# Patient Record
Sex: Female | Born: 1935
Health system: Southern US, Community
[De-identification: ages and names within clinical notes are randomized; demographics above are authoritative.]

## PROBLEM LIST (undated history)

## (undated) DIAGNOSIS — E7800 Pure hypercholesterolemia, unspecified: Secondary | ICD-10-CM

## (undated) DIAGNOSIS — Z974 Presence of external hearing-aid: Secondary | ICD-10-CM

## (undated) DIAGNOSIS — I10 Essential (primary) hypertension: Secondary | ICD-10-CM

## (undated) DIAGNOSIS — M8580 Other specified disorders of bone density and structure, unspecified site: Secondary | ICD-10-CM

## (undated) DIAGNOSIS — K8689 Other specified diseases of pancreas: Secondary | ICD-10-CM

## (undated) DIAGNOSIS — M51369 Other intervertebral disc degeneration, lumbar region without mention of lumbar back pain or lower extremity pain: Secondary | ICD-10-CM

## (undated) DIAGNOSIS — K589 Irritable bowel syndrome without diarrhea: Secondary | ICD-10-CM

## (undated) DIAGNOSIS — Z8719 Personal history of other diseases of the digestive system: Secondary | ICD-10-CM

## (undated) DIAGNOSIS — IMO0002 Reserved for concepts with insufficient information to code with codable children: Secondary | ICD-10-CM

## (undated) DIAGNOSIS — K5790 Diverticulosis of intestine, part unspecified, without perforation or abscess without bleeding: Secondary | ICD-10-CM

## (undated) DIAGNOSIS — M1990 Unspecified osteoarthritis, unspecified site: Secondary | ICD-10-CM

## (undated) DIAGNOSIS — I639 Cerebral infarction, unspecified: Secondary | ICD-10-CM

## (undated) DIAGNOSIS — N83209 Unspecified ovarian cyst, unspecified side: Secondary | ICD-10-CM

## (undated) DIAGNOSIS — M5136 Other intervertebral disc degeneration, lumbar region: Secondary | ICD-10-CM

## (undated) DIAGNOSIS — C801 Malignant (primary) neoplasm, unspecified: Secondary | ICD-10-CM

## (undated) DIAGNOSIS — N1330 Unspecified hydronephrosis: Secondary | ICD-10-CM

## (undated) HISTORY — PX: COLONOSCOPY: SHX174

## (undated) HISTORY — PX: FRACTURE SURGERY: SHX138

## (undated) HISTORY — PX: TONSILLECTOMY: SUR1361

## (undated) HISTORY — PX: OTHER SURGICAL HISTORY: SHX169

---

## 2004-06-01 ENCOUNTER — Ambulatory Visit: Payer: Self-pay | Admitting: Internal Medicine

## 2004-10-05 ENCOUNTER — Encounter: Payer: Self-pay | Admitting: Internal Medicine

## 2004-12-13 ENCOUNTER — Ambulatory Visit: Payer: Self-pay | Admitting: Internal Medicine

## 2005-12-14 ENCOUNTER — Ambulatory Visit: Payer: Self-pay | Admitting: Internal Medicine

## 2005-12-21 ENCOUNTER — Inpatient Hospital Stay: Payer: Self-pay | Admitting: Internal Medicine

## 2005-12-21 ENCOUNTER — Other Ambulatory Visit: Payer: Self-pay

## 2007-01-21 ENCOUNTER — Ambulatory Visit: Payer: Self-pay | Admitting: Internal Medicine

## 2008-01-26 ENCOUNTER — Ambulatory Visit: Payer: Self-pay | Admitting: Internal Medicine

## 2008-03-05 ENCOUNTER — Emergency Department: Payer: Self-pay | Admitting: Emergency Medicine

## 2009-02-07 ENCOUNTER — Ambulatory Visit: Payer: Self-pay | Admitting: Internal Medicine

## 2009-10-19 ENCOUNTER — Ambulatory Visit: Payer: Self-pay | Admitting: Internal Medicine

## 2009-10-31 ENCOUNTER — Encounter: Payer: Self-pay | Admitting: Radiation Oncology

## 2009-11-11 ENCOUNTER — Encounter: Payer: Self-pay | Admitting: Radiation Oncology

## 2009-12-11 ENCOUNTER — Encounter: Payer: Self-pay | Admitting: Radiation Oncology

## 2010-01-11 ENCOUNTER — Encounter: Payer: Self-pay | Admitting: Radiation Oncology

## 2010-02-10 ENCOUNTER — Encounter: Payer: Self-pay | Admitting: Radiation Oncology

## 2010-03-10 ENCOUNTER — Ambulatory Visit: Payer: Self-pay | Admitting: Internal Medicine

## 2010-03-13 ENCOUNTER — Encounter: Payer: Self-pay | Admitting: Radiation Oncology

## 2011-03-12 ENCOUNTER — Ambulatory Visit: Payer: Self-pay | Admitting: Internal Medicine

## 2011-04-29 ENCOUNTER — Observation Stay: Payer: Self-pay | Admitting: Family Medicine

## 2011-07-02 ENCOUNTER — Ambulatory Visit: Payer: Self-pay

## 2011-08-05 ENCOUNTER — Ambulatory Visit: Payer: Self-pay | Admitting: Physician Assistant

## 2012-03-12 ENCOUNTER — Ambulatory Visit: Payer: Self-pay | Admitting: Family Medicine

## 2013-03-20 ENCOUNTER — Ambulatory Visit: Payer: Self-pay | Admitting: Family Medicine

## 2013-07-02 ENCOUNTER — Emergency Department: Payer: Self-pay | Admitting: Emergency Medicine

## 2013-07-02 LAB — CBC WITH DIFFERENTIAL/PLATELET
Basophil #: 0.1 10*3/uL (ref 0.0–0.1)
HCT: 40.8 % (ref 35.0–47.0)
HGB: 13.8 g/dL (ref 12.0–16.0)
Lymphocyte #: 1.9 10*3/uL (ref 1.0–3.6)
Lymphocyte %: 20.7 %
MCH: 29.7 pg (ref 26.0–34.0)
Monocyte %: 6.2 %
Neutrophil #: 6.5 10*3/uL (ref 1.4–6.5)
RDW: 13 % (ref 11.5–14.5)
WBC: 9.3 10*3/uL (ref 3.6–11.0)

## 2013-07-02 LAB — COMPREHENSIVE METABOLIC PANEL
Albumin: 4.2 g/dL (ref 3.4–5.0)
Alkaline Phosphatase: 92 U/L (ref 50–136)
Anion Gap: 6 — ABNORMAL LOW (ref 7–16)
BUN: 7 mg/dL (ref 7–18)
Bilirubin,Total: 0.6 mg/dL (ref 0.2–1.0)
Calcium, Total: 10.7 mg/dL — ABNORMAL HIGH (ref 8.5–10.1)
Chloride: 104 mmol/L (ref 98–107)
Co2: 28 mmol/L (ref 21–32)
Creatinine: 0.61 mg/dL (ref 0.60–1.30)
EGFR (African American): >60
EGFR (Non-African Amer.): >60
Glucose: 128 mg/dL — ABNORMAL HIGH (ref 65–99)
Osmolality: 275 (ref 275–301)
Potassium: 3.4 mmol/L — ABNORMAL LOW (ref 3.5–5.1)
Total Protein: 7.6 g/dL (ref 6.4–8.2)

## 2013-08-13 HISTORY — PX: OOPHORECTOMY: SHX86

## 2013-10-14 ENCOUNTER — Ambulatory Visit: Payer: Self-pay | Admitting: Family Medicine

## 2014-03-30 ENCOUNTER — Ambulatory Visit: Payer: Self-pay | Admitting: Family Medicine

## 2014-07-08 LAB — CBC
HCT: 39.3 % (ref 35.0–47.0)
HGB: 13.1 g/dL (ref 12.0–16.0)
MCH: 30.2 pg (ref 26.0–34.0)
MCHC: 33.3 g/dL (ref 32.0–36.0)
MCV: 91 fL (ref 80–100)
Platelet: 317 10*3/uL (ref 150–440)
RBC: 4.34 10*6/uL (ref 3.80–5.20)
RDW: 13.7 % (ref 11.5–14.5)
WBC: 10.7 10*3/uL (ref 3.6–11.0)

## 2014-07-08 LAB — COMPREHENSIVE METABOLIC PANEL
AST: 20 U/L (ref 15–37)
Albumin: 4.2 g/dL (ref 3.4–5.0)
Alkaline Phosphatase: 90 U/L
Anion Gap: 6 — ABNORMAL LOW (ref 7–16)
BILIRUBIN TOTAL: 0.3 mg/dL (ref 0.2–1.0)
BUN: 12 mg/dL (ref 7–18)
Calcium, Total: 10.7 mg/dL — ABNORMAL HIGH (ref 8.5–10.1)
Chloride: 103 mmol/L (ref 98–107)
Co2: 29 mmol/L (ref 21–32)
Creatinine: 1.1 mg/dL (ref 0.60–1.30)
GFR CALC NON AF AMER: 51 — AB
GLUCOSE: 122 mg/dL — AB (ref 65–99)
OSMOLALITY: 277 (ref 275–301)
POTASSIUM: 3.8 mmol/L (ref 3.5–5.1)
SGPT (ALT): 24 U/L
Sodium: 138 mmol/L (ref 136–145)
TOTAL PROTEIN: 7.7 g/dL (ref 6.4–8.2)

## 2014-07-08 LAB — URINALYSIS, COMPLETE
BILIRUBIN, UR: NEGATIVE
Bacteria: NONE SEEN
Blood: NEGATIVE
GLUCOSE, UR: NEGATIVE mg/dL (ref 0–75)
Ketone: NEGATIVE
Leukocyte Esterase: NEGATIVE
Nitrite: NEGATIVE
PROTEIN: NEGATIVE
Ph: 6 (ref 4.5–8.0)
RBC,UR: <1 /HPF (ref 0–5)
Specific Gravity: 1.003 (ref 1.003–1.030)
Squamous Epithelial: NONE SEEN

## 2014-07-08 LAB — TROPONIN I

## 2014-07-09 ENCOUNTER — Observation Stay: Payer: Self-pay

## 2014-07-10 LAB — BASIC METABOLIC PANEL
Anion Gap: 7 (ref 7–16)
BUN: 13 mg/dL (ref 7–18)
CALCIUM: 10.1 mg/dL (ref 8.5–10.1)
CHLORIDE: 105 mmol/L (ref 98–107)
CO2: 26 mmol/L (ref 21–32)
Creatinine: 0.96 mg/dL (ref 0.60–1.30)
GFR CALC NON AF AMER: 59 — AB
GLUCOSE: 97 mg/dL (ref 65–99)
OSMOLALITY: 276 (ref 275–301)
POTASSIUM: 3.8 mmol/L (ref 3.5–5.1)
Sodium: 138 mmol/L (ref 136–145)

## 2014-07-10 LAB — CBC WITH DIFFERENTIAL/PLATELET
BASOS PCT: 1.2 %
Basophil #: 0.1 10*3/uL (ref 0.0–0.1)
EOS PCT: 6.6 %
Eosinophil #: 0.5 10*3/uL (ref 0.0–0.7)
HCT: 37.5 % (ref 35.0–47.0)
HGB: 12.5 g/dL (ref 12.0–16.0)
LYMPHS ABS: 1.7 10*3/uL (ref 1.0–3.6)
Lymphocyte %: 22.1 %
MCH: 30.1 pg (ref 26.0–34.0)
MCHC: 33.4 g/dL (ref 32.0–36.0)
MCV: 90 fL (ref 80–100)
MONO ABS: 0.7 x10 3/mm (ref 0.2–0.9)
Monocyte %: 8.4 %
Neutrophil #: 4.8 10*3/uL (ref 1.4–6.5)
Neutrophil %: 61.7 %
Platelet: 243 10*3/uL (ref 150–440)
RBC: 4.16 10*6/uL (ref 3.80–5.20)
RDW: 13.7 % (ref 11.5–14.5)
WBC: 7.8 10*3/uL (ref 3.6–11.0)

## 2014-12-04 NOTE — H&P (Signed)
PATIENT NAME:  Kellie Harris, Kellie Harris MR#:  [id number] DATE OF BIRTH:  15-Apr-1931  DATE OF ADMISSION:  07/08/2014  REFERRING PHYSICIAN:  Ahmed Prima, MD  PRIMARY CARE PHYSICIAN:  [facility] clinic).  ADMITTING PHYSICIAN:  Juluis Mire, MD   CHIEF COMPLAINT:  Right hand weakness that started around 7:30 p.m.    HISTORY OF PRESENT ILLNESS:  This is an 81 year old Caucasian female with a past medical history significant for hypertension and hyperlipidemia, who presents to the Emergency Room with the complaint of acute onset of right hand weakness, which started around 7:30 p.m. today. The patient stated that she was in her usual state of health until that time, and after that, she developed some right hand weakness and was also noted to have elevated blood pressure at home, as it was checked by her son-in-law and daughter, hence, came to the Emergency Room for further evaluation. The patient did not have any other focal weakness in other areas of the body. No speech disturbances. No swallowing disturbances. No visual disturbances. No gait abnormalities.   In the Emergency Room, the patient was evaluated by the ED physician and was noted to have right hand weakness after 2-1/2 hours after the symptom onset and had further workup in the Emergency Room, which involved normal blood test, but her CT scan was significant for no acute changes but some subacute versus chronic infarcts, hence hospitalist service was consulted for further evaluation and management.   The patient states that now she is doing well, and she states that her right hand weakness resolved almost completely and is back in her normal state. Denies any further focal weakness in other parts of the body. No speech, swallowing, or visual disturbances. No history of any chest pain, shortness of breath, dizziness, or loss of consciousness. No recent fever, cough, nausea, vomiting, diarrhea, or abdominal pain. No recent urinary tract  symptoms. The patient is comfortably lying in the bed at this time and denies any complaints at this time.   PAST MEDICAL HISTORY:   1.  Hypertension. 2.  Hyperlipidemia.    PAST SURGICAL HISTORY:   1.  Status post laparoscopic left ovarian cyst removal in January 2015.   2.  Status post right ureteral stent removal in September 2015.  HOME MEDICATIONS: 1.  Lisinopril 20-mg tablet 1 tablet orally twice a day.  2.  Amlodipine 5-mg tablet 1 tablet orally once a day.  3.  Pravastatin 20 mg 1 tablet orally once at night.   ALLERGIES:  No known drug allergies.   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 Denies any history of smoking, alcohol, or tobacco usage. No history of any drug usage.   FAMILY HISTORY:  Dad significant for CVA and COPD. Mom significant for CVA and leukemia at a later age.   REVIEW OF SYSTEMS: CONSTITUTIONAL:  Negative for fever, fatigue, or generalized weakness. No pain. No abnormal weight gain or weight loss lately.  EYES:  Negative for blurred vision or double vision. No pain. No redness. No inflammation.  EARS, NOSE, AND THROAT:  Negative for tinnitus, ear pain, or hearing loss. No epistaxis. No discharge. No difficulty swallowing.  RESPIRATORY:  Negative for cough, wheezing, hemoptysis, dyspnea, or painful respiration.  CARDIOVASCULAR:  Negative for chest pain, orthopnea, palpitations, dizziness, syncope, or pedal edema.  GASTROINTESTINAL:  Negative for nausea, vomiting, diarrhea, abdominal pain, hematemesis, melena, or rectal bleeding. No history of any GERD symptoms.  GENITOURINARY:  Negative for dysuria, hematuria, frequency, or urgency.  ENDOCRINE:  Negative for polyuria or polydipsia. No heat or cold intolerance.  HEMATOLOGIC:  Negative for anemia or easy bruising or bleeding.  INTEGUMENTARY:  Negative for acne, skin lesions, or rash.  MUSCULOSKELETAL:  Negative for any arthritis, joint swelling, or  pain.  NEUROLOGICAL:  As mentioned in the history of present illness, the patient developed right hand weakness around 7:30 p.m., but she denies any other focal weakness in other parts of the body. No history of CVA, TIA, or seizure disorder in the past.  PSYCHIATRIC:  Negative for anxiety, insomnia, or depression.   PHYSICAL EXAMINATION: VITAL SIGNS:  On presentation to the Emergency Room, temperature 98 degrees Fahrenheit, pulse rate 102 per minute, respirations 24 per minute, blood pressure 207/93, oxygen saturation 92% on room air. Current pulse rate 80 per minute and regular, respirations 18 per minute, blood pressure 155/86, oxygen saturation 96% on room air.  GENERAL:  Well-nourished lady, pleasant and cooperative, comfortably lying in the bed in no acute distress. Alert and oriented x 3.  HEAD:  Atraumatic, normocephalic.  EYES:  Pupils are equal and reactive to light and accommodation. No conjunctival pallor. No scleral icterus. Extraocular movements are intact.  NOSE:  No nasal lesions. No drainage.  EARS:  No drainage. No external lesions.  ORAL CAVITY:  No mucosal lesions. No exudates. No masses.  NECK:  Supple. No JVD. No thyromegaly. No carotid bruit. Range of motion is normal.  RESPIRATORY:  Good respiratory effort. Not using any accessory muscles of respiration. Bilateral vesicular breath sounds present. No rales or rhonchi.  CARDIOVASCULAR:  S1 and S2, regular. No murmurs, gallops, or clicks appreciated. Pulses are equal at carotid, femoral, and pedal pulses. No pedal edema.  GASTROINTESTINAL:  Abdomen is soft and nontender. No hepatosplenomegaly. Bowel sounds are present and equal in all 4 quadrants. No rebound. No guarding. No rigidity.  GENITOURINARY:  Deferred.  MUSCULOSKELETAL:  No tenderness. No effusion. Range of motion is normal in all segments. Strength and tone are equal bilaterally and stable.  SKIN:  Inspection within normal limits.  LYMPHATIC:  No cervical  lymphadenopathy.  VASCULAR:  Good dorsalis pedis and posterior tibial pulses.  NEUROLOGICAL:  Alert, awake, and oriented x 3. Cranial nerves II through XII are grossly intact. DTRs are 2+ and symmetrical bilaterally in both upper extremities and lower extremities. Motor strength is 5/5 in both upper and lower extremities. Hand grip is equal in the right and left hand.  PSYCHIATRIC:  Judgment and insight are adequate. Alert and oriented x 3. Memory and mood are within normal limits.   LABORATORY DATA:  Serum glucose is 122, BUN 12, creatinine 1.1, serum sodium 138, potassium 3.8, chloride 103, bicarbonate 29, calcium 10.7, total protein 7.7, albumin 24.2, total bilirubin 0.3, alkaline phosphatase 90, AST 20, and ALT is 24. Troponin is less than 0.02. WBC is 10.7, hemoglobin 13.1, hematocrit 39.3, and platelet count is 317,000. Urinalysis is unremarkable.   IMAGING STUDIES:   CT of the head, noncontrast study impression:   1.  No acute intracranial abnormalities.  2.  Small basal ischemic changes and brain atrophy.  3.  Multifocal areas of low attenuation in the right cerebral hemisphere compatible with subacute to chronic infarcts.   Chest x-ray impression:  No edema. No consolidation.    EKG:  Normal sinus rhythm with ventricular rate of 99 beats per minute. Left axis deviation. Right bundle branch. Inferior infarct. No acute ST-T changes.  ASSESSMENT AND PLAN:  This is an 81 year old Caucasian female with a past medical history of hypertension and hyperlipidemia, who presents to the Emergency Room with the complaint of acute onset of right hand weakness, which started around 7:30 p.m. today.   1.  Right hand weakness, concerning for cerebrovascular accident versus transient ischemic attack. CT scan was negative for acute changes but concerning for subacute-to-chronic infarcts in the cerebral hemisphere on the right side. Plan:  Admit to telemetry. Neurological watch. Aspirin. Monitor blood  pressure. Order carotid Doppler, echocardiogram, and MRI and monitor blood pressure.  2.  History of hypertension, elevated blood pressure on presentation to the Emergency Room. The patient received IV labetalol because of elevated blood pressure and tachycardia, following with her blood pressure in the reasonable range now and her tachycardia resolved. The patient is on lisinopril and amlodipine at home. Continue home medications and monitor blood pressure and adjust blood pressure medications as needed.  3.  Hyperlipidemia on pravastatin. Stable. Continue same.  4.  Deep vein thrombosis prophylaxis with subcutaneous Lovenox.  5.  Gastrointestinal prophylaxis with ranitidine.   CODE STATUS:  Full code.   TIME SPENT:  55 minutes.     ____________________________ Juluis Mire, MD enr:nb D: 07/09/2014 00:06:09 ET T: 07/09/2014 00:34:23 ET JOB#: [id number]  cc: Juluis Mire, MD, <Dictator> Dr. Marcelyn Bruins [facility] clinic).  Ulice Brilliant Vedant Shehadeh MD ELECTRONICALLY SIGNED 07/09/2014 18:15

## 2014-12-08 NOTE — Discharge Summary (Signed)
PATIENT NAME:  Kellie Harris, Kellie Harris MR#:  [id number] DATE OF BIRTH:  25-Mar-1931  DATE OF ADMISSION:  07/09/2014 DATE OF DISCHARGE:  07/10/2014  DISCHARGE DIAGNOSES: 1.  Transient ischemic attack.  2.  Hypertension.  3.  Hyperlipidemia.   HISTORY OF PRESENT ILLNESS: Please see initial history and physical for details. Briefly, this is an 81 year old female with history of hypertension, hyperlipidemia, admitted with right arm weakness. This persisted in the ER. Her blood pressure was also noted to be elevated. She was admitted and her blood pressure controlled and workup including CT which was negative. MRI showed old infarcts but no acute infarcts. Carotid Dopplers showed less than 50% stenosis. The patient was not on aspirin as an outpatient as she had bruising with it before. Plan was to start her on 325 aspirin. She did initially receive Plavix in the hospital. She will follow up with Dr. Netty Starring in 1 to 2 weeks. If she remains stable on the 325 of aspirin, she can be considered to be switched to low dose aspirin following that.   DISCHARGE DIET: Low sodium, regular consistency.   DISCHARGE FOLLOWUP: Dr. Netty Starring in 1 to 2 weeks.   TIME SPENT: This discharge took 35 minutes.    ____________________________ Cheral Marker. Ola Spurr, MD dpf:at D: 07/10/2014 13:33:46 ET T: 07/10/2014 16:06:54 ET JOB#: [id number]  cc: Cheral Marker. Ola Spurr, MD, <Dictator> Jacelyn Cuen Ola Spurr MD ELECTRONICALLY SIGNED 08/15/2014 21:17

## 2015-01-31 ENCOUNTER — Other Ambulatory Visit: Payer: Self-pay | Admitting: Gastroenterology

## 2015-01-31 DIAGNOSIS — K8689 Other specified diseases of pancreas: Secondary | ICD-10-CM

## 2015-02-04 ENCOUNTER — Ambulatory Visit
Admission: RE | Admit: 2015-02-04 | Discharge: 2015-02-04 | Disposition: A | Discharge status: Home / Self Care | Payer: Medicare Other | Source: Ambulatory Visit | Attending: Gastroenterology | Admitting: Gastroenterology

## 2015-02-04 DIAGNOSIS — N2889 Other specified disorders of kidney and ureter: Secondary | ICD-10-CM | POA: Diagnosis not present

## 2015-02-04 DIAGNOSIS — N1330 Unspecified hydronephrosis: Secondary | ICD-10-CM | POA: Insufficient documentation

## 2015-02-04 DIAGNOSIS — K868 Other specified diseases of pancreas: Secondary | ICD-10-CM | POA: Insufficient documentation

## 2015-02-04 DIAGNOSIS — C786 Secondary malignant neoplasm of retroperitoneum and peritoneum: Secondary | ICD-10-CM | POA: Diagnosis not present

## 2015-02-04 DIAGNOSIS — K8689 Other specified diseases of pancreas: Secondary | ICD-10-CM

## 2015-02-04 MED ORDER — GADOBENATE DIMEGLUMINE 529 MG/ML IV SOLN: 10.0000 mL | Freq: Once | INTRAVENOUS | Status: AC | PRN

## 2015-03-04 ENCOUNTER — Emergency Department: Payer: Medicare Other

## 2015-03-04 ENCOUNTER — Encounter: Payer: Self-pay | Admitting: Emergency Medicine

## 2015-03-04 DIAGNOSIS — Y998 Other external cause status: Secondary | ICD-10-CM | POA: Insufficient documentation

## 2015-03-04 DIAGNOSIS — Y9389 Activity, other specified: Secondary | ICD-10-CM | POA: Diagnosis not present

## 2015-03-04 DIAGNOSIS — S8991XA Unspecified injury of right lower leg, initial encounter: Secondary | ICD-10-CM | POA: Diagnosis present

## 2015-03-04 DIAGNOSIS — Y9289 Other specified places as the place of occurrence of the external cause: Secondary | ICD-10-CM | POA: Diagnosis not present

## 2015-03-04 DIAGNOSIS — I10 Essential (primary) hypertension: Secondary | ICD-10-CM | POA: Diagnosis not present

## 2015-03-04 DIAGNOSIS — S72431A Displaced fracture of medial condyle of right femur, initial encounter for closed fracture: Secondary | ICD-10-CM | POA: Insufficient documentation

## 2015-03-04 NOTE — ED Notes (Signed)
Pt presents to ER alert and in NAD. Pt states she slipped and fell onto her right knee, denies LOC, denies further injury.

## 2015-03-05 ENCOUNTER — Emergency Department
Admission: EM | Admit: 2015-03-05 | Discharge: 2015-03-05 | Disposition: A | Discharge status: Home / Self Care | Payer: Medicare Other | Attending: Emergency Medicine | Admitting: Emergency Medicine

## 2015-03-05 DIAGNOSIS — IMO0002 Reserved for concepts with insufficient information to code with codable children: Secondary | ICD-10-CM

## 2015-03-05 DIAGNOSIS — S72411A Displaced unspecified condyle fracture of lower end of right femur, initial encounter for closed fracture: Secondary | ICD-10-CM

## 2015-03-05 DIAGNOSIS — S72491A Other fracture of lower end of right femur, initial encounter for closed fracture: Secondary | ICD-10-CM

## 2015-03-05 HISTORY — DX: Other specified diseases of pancreas: K86.89

## 2015-03-05 HISTORY — DX: Essential (primary) hypertension: I10

## 2015-03-05 HISTORY — DX: Unspecified hydronephrosis: N13.30

## 2015-03-05 HISTORY — DX: Pure hypercholesterolemia, unspecified: E78.00

## 2015-03-05 HISTORY — DX: Unspecified ovarian cyst, unspecified side: N83.209

## 2015-03-05 HISTORY — DX: Reserved for concepts with insufficient information to code with codable children: IMO0002

## 2015-03-05 MED ORDER — OXYCODONE-ACETAMINOPHEN 5-325 MG PO TABS
ORAL_TABLET | ORAL | Status: AC
  Filled 2015-03-05: qty 1

## 2015-03-05 MED ORDER — OXYCODONE-ACETAMINOPHEN 5-325 MG PO TABS: 1.0000 | ORAL_TABLET | Freq: Four times a day (QID) | ORAL | Status: DC | PRN

## 2015-03-05 MED ORDER — OXYCODONE-ACETAMINOPHEN 5-325 MG PO TABS
1.0000 | ORAL_TABLET | Freq: Once | ORAL | Status: AC
  Administered 2015-03-05: 1 via ORAL

## 2015-03-05 NOTE — ED Notes (Signed)
Patient with right knee swelling. Patient instructed on correct use of crutches. Ace wrap to right knee.

## 2015-03-05 NOTE — Discharge Instructions (Signed)
Knee Fracture, Adult A knee fracture is a break in any of the bones of the lower part of the thigh bone, the upper part of the bones of the lower leg, or of the kneecap. When the bones no longer meet the way they are supposed to it is called a dislocation. Sometimes there can be a dislocation along with fractures. SYMPTOMS  Symptoms may include pain, swelling, inability to bend the knee, deformity of the knee, and inability to walk.  DIAGNOSIS  This problem is usually diagnosed with x-rays. Special studies are sometimes done if a fracture is suspected but cannot be seen on ordinary x-rays. If vessels around the knee are injured, special tests may be done to see what the damage is. TREATMENT   The leg is usually splinted for the first couple of days to allow for swelling. After the swelling is down a cast is put on. Sometimes a cast is put on right away with the sides of the cast cut to allow the knee to swell. If the bones are in place, this may be all that is needed.  If the bones are out of place, medications for pain are given to allow them to be put back in place. If they are seriously out of place, surgery may be needed to hold the pieces or breaks in place using wires, pins, screws or metal plates.  Generally most fractures will heal in 4 to 6 weeks. HOME CARE INSTRUCTIONS   Use your crutches as directed.  To lessen the swelling, keep the injured leg elevated while sitting or lying down.  Apply ice to the injury for 15-20 minutes, 03-04 times per day while awake for 2 days. Put the ice in a plastic bag and place a thin towel between the bag of ice and your cast.  If you have a plaster or fiberglass cast:  Do not try to scratch the skin under the cast using sharp or pointed objects.  Check the skin around the cast every day. You may put lotion on any red or sore areas.  Keep your cast [place] and clean.  If you have a plaster splint:  Wear the splint as directed.  You may loosen the  elastic around the splint if your toes become numb, tingle, or turn cold or blue.  Do not put pressure on any part of your cast or splint; it may break. Rest your cast only on a pillow the first 24 hours until it is fully hardened.  Your cast or splint can be protected during bathing with a plastic bag. Do not lower the cast or splint into water.  Only take over-the-counter or prescription medicines for pain, discomfort, or fever as directed by your caregiver.  See your caregiver soon if your cast gets damaged or breaks.  It is very important to keep all follow up appointments. Not following up as directed may result in a worsening of your condition or a failure of the fracture to heal properly. SEEK IMMEDIATE MEDICAL CARE IF:  You have continued severe pain.  You have more swelling than you did before the cast was put on.  The area below the fracture becomes painful.  Your skin or toenails below the injury turn blue or gray, or feel cold or numb.  There is drainage coming from under the cast.  New, unexplained symptoms develop (drugs used in treatment may produce side effects). MAKE SURE YOU:   Understand these instructions.  Will watch your condition.  Will   get help right away if you are not doing well or get worse. Document Released: 06/12/2006 Document Revised: 10/22/2011 Document Reviewed: 07/14/2007 [facility] Patient Information 2015 Kellie Harris, [place]. This information is not intended to replace advice given to you by your health care provider. Make sure you discuss any questions you have with your health care provider.      As we have discussed please call orthopedics on Monday to arrange a follow-up appointment. In the meantime keep the knee elevated, with ice packs when not in use. You may ice the knee for 20-30 minutes every 2 hours as needed for swelling. Please take your pain medication as needed, but only as prescribed. Please use crutches when walking to minimize  pain.

## 2015-03-05 NOTE — ED Provider Notes (Signed)
[facility] Emergency Department Provider Note  Time seen: 1:45 AM  I have reviewed the triage vital signs and the nursing notes.   HISTORY  Chief Complaint Knee Pain    HPI Kellie Harris is a 81 y.o. female with a past medical history of hypertension, hyperlipidemia, arthritis, presents the emergency department with right knee pain after a fall. According to the patient she was getting out of her sons truck when she slipped landing on the ground on her knee.Patient states immediate right knee pain and swelling. Denies any numbness. Denies any other injuries. Did not hit her head, did not pass out     Past Medical History  Diagnosis Date  . Hypertension   . Hypercholesteremia   . Pancreatic insufficiency   . Hydronephrosis   . Ovarian cyst   . Thyroid disease     There are no active problems to display for this patient.   Past Surgical History  Procedure Laterality Date  . Knee surgery    . Kidney stent      No current outpatient prescriptions on file.  Allergies Review of patient's allergies indicates no known allergies.  History reviewed. No pertinent family history.  Social History History  Substance Use Topics  . Smoking status: Never Smoker   . Smokeless tobacco: Not on file  . Alcohol Use: No    Review of Systems Constitutional: Negative for fever. Cardiovascular: Negative for chest pain. Respiratory: Negative for shortness of breath. Gastrointestinal: Negative for abdominal pain Musculoskeletal: Negative for back pain. Neurological: Negative for headache 10-point ROS otherwise negative.  ____________________________________________   PHYSICAL EXAM:  VITAL SIGNS: ED Triage Vitals  Enc Vitals Group     BP 03/04/15 2111 177/81 mmHg     Pulse Rate 03/04/15 2111 84     Resp 03/04/15 2111 20     Temp 03/04/15 2111 98.2 F (36.8 C)     Temp Source 03/04/15 2111 Oral     SpO2 03/04/15 2111 98 %     Weight 03/04/15 2111  110 lb (49.896 kg)     Height 03/04/15 2111 4\' 11"  (1.499 m)     Head Cir --      Peak Flow --      Pain Score 03/04/15 2111 9     Pain Loc --      Pain Edu? --      Excl. in [place]? --     Constitutional: Alert and oriented. Well appearing and in no distress. ENT   Head: Normocephalic and atraumatic Cardiovascular: Normal rate, regular rhythm. No murmur Respiratory: Normal respiratory effort without tachypnea nor retractions. Breath sounds are clear Gastrointestinal: Soft and nontender. No distention.  Musculoskeletal: Moderate swelling of the right knee, with palpable joint effusion. Tenderness of the right knee especially laterally. 2+ DP pulse, sensation intact/normal. Neurologic:  Normal speech and language. No gross focal neurologic deficits  Skin:  Skin is warm, Bick and intact.  Psychiatric: Mood and affect are normal.  ____________________________________________    RADIOLOGY  X-ray consistent with lateral femoral condyle avulsion fracture.  ____________________________________________    INITIAL IMPRESSION / ASSESSMENT AND PLAN / ED COURSE  Pertinent labs & imaging results that were available during my care of the patient were reviewed by me and considered in my medical decision making (see chart for details).  X-ray consistent with avulsion fracture of the lateral condyle of the femur. Patient does have a moderate joint effusion, with moderate tenderness to palpation. The fracture does not  appear to involve the articulating surface. We will Ace wrap the knee, I discussed rest, ice, compression with the patient who is agreeable. We will place the patient on Percocet for pain control which she has taken in the past for other surgeries. And we will provide crutches for the patient. I discussed with the patient the need to follow-up with orthopedics on Monday. The patient is agreeable to plan.  ____________________________________________   FINAL CLINICAL IMPRESSION(S)  / ED DIAGNOSES  Right femoral avulsion fracture   Harvest Dark, MD 03/05/15 [phone]

## 2015-05-31 ENCOUNTER — Other Ambulatory Visit: Payer: Self-pay | Admitting: Family Medicine

## 2015-05-31 DIAGNOSIS — Z1231 Encounter for screening mammogram for malignant neoplasm of breast: Secondary | ICD-10-CM

## 2015-06-07 ENCOUNTER — Ambulatory Visit
Admission: RE | Admit: 2015-06-07 | Discharge: 2015-06-07 | Disposition: A | Discharge status: Home / Self Care | Payer: Medicare Other | Source: Ambulatory Visit | Attending: Family Medicine | Admitting: Family Medicine

## 2015-06-07 DIAGNOSIS — Z1231 Encounter for screening mammogram for malignant neoplasm of breast: Secondary | ICD-10-CM | POA: Insufficient documentation

## 2015-06-07 HISTORY — DX: Malignant (primary) neoplasm, unspecified: C80.1

## 2015-09-13 DIAGNOSIS — H2513 Age-related nuclear cataract, bilateral: Secondary | ICD-10-CM | POA: Diagnosis not present

## 2015-10-19 DIAGNOSIS — R39198 Other difficulties with micturition: Secondary | ICD-10-CM | POA: Diagnosis not present

## 2015-10-19 DIAGNOSIS — N131 Hydronephrosis with ureteral stricture, not elsewhere classified: Secondary | ICD-10-CM | POA: Diagnosis not present

## 2015-10-19 DIAGNOSIS — R339 Retention of urine, unspecified: Secondary | ICD-10-CM | POA: Diagnosis not present

## 2015-10-19 DIAGNOSIS — N135 Crossing vessel and stricture of ureter without hydronephrosis: Secondary | ICD-10-CM | POA: Diagnosis not present

## 2015-10-19 DIAGNOSIS — G319 Degenerative disease of nervous system, unspecified: Secondary | ICD-10-CM | POA: Diagnosis not present

## 2015-10-19 DIAGNOSIS — N1330 Unspecified hydronephrosis: Secondary | ICD-10-CM | POA: Diagnosis not present

## 2015-10-25 DIAGNOSIS — H2513 Age-related nuclear cataract, bilateral: Secondary | ICD-10-CM | POA: Diagnosis not present

## 2015-10-26 ENCOUNTER — Encounter: Payer: Self-pay | Admitting: *Deleted

## 2015-10-27 NOTE — Discharge Instructions (Signed)

## 2015-10-31 DIAGNOSIS — M19012 Primary osteoarthritis, left shoulder: Secondary | ICD-10-CM | POA: Diagnosis not present

## 2015-11-02 ENCOUNTER — Ambulatory Visit: Payer: PPO | Admitting: Anesthesiology

## 2015-11-02 ENCOUNTER — Encounter
Admission: RE | Disposition: A | Discharge status: Home / Self Care | Payer: Self-pay | Source: Ambulatory Visit | Attending: Ophthalmology

## 2015-11-02 ENCOUNTER — Ambulatory Visit
Admission: RE | Admit: 2015-11-02 | Discharge: 2015-11-02 | Disposition: A | Discharge status: Home / Self Care | Payer: PPO | Source: Ambulatory Visit | Attending: Ophthalmology | Admitting: Ophthalmology

## 2015-11-02 DIAGNOSIS — I10 Essential (primary) hypertension: Secondary | ICD-10-CM | POA: Insufficient documentation

## 2015-11-02 DIAGNOSIS — H2513 Age-related nuclear cataract, bilateral: Secondary | ICD-10-CM | POA: Diagnosis not present

## 2015-11-02 DIAGNOSIS — H2511 Age-related nuclear cataract, right eye: Secondary | ICD-10-CM | POA: Diagnosis not present

## 2015-11-02 HISTORY — DX: Irritable bowel syndrome, unspecified: K58.9

## 2015-11-02 HISTORY — DX: Presence of external hearing-aid: Z97.4

## 2015-11-02 HISTORY — DX: Diverticulosis of intestine, part unspecified, without perforation or abscess without bleeding: K57.90

## 2015-11-02 HISTORY — DX: Personal history of other diseases of the digestive system: Z87.19

## 2015-11-02 HISTORY — DX: Unspecified osteoarthritis, unspecified site: M19.90

## 2015-11-02 HISTORY — DX: Reserved for concepts with insufficient information to code with codable children: IMO0002

## 2015-11-02 HISTORY — PX: CATARACT EXTRACTION W/PHACO: SHX586

## 2015-11-02 HISTORY — DX: Other intervertebral disc degeneration, lumbar region without mention of lumbar back pain or lower extremity pain: M51.369

## 2015-11-02 HISTORY — DX: Other intervertebral disc degeneration, lumbar region: M51.36

## 2015-11-02 SURGERY — PHACOEMULSIFICATION, CATARACT, WITH IOL INSERTION
Anesthesia: Monitor Anesthesia Care | Laterality: Right | Wound class: Clean

## 2015-11-02 MED ORDER — CEFUROXIME OPHTHALMIC INJECTION 1 MG/0.1 ML
INJECTION | OPHTHALMIC | Status: DC | PRN
  Administered 2015-11-02: 0.1 mL via OPHTHALMIC

## 2015-11-02 MED ORDER — MIDAZOLAM HCL 2 MG/2ML IJ SOLN
INTRAMUSCULAR | Status: DC | PRN
  Administered 2015-11-02: 2 mg via INTRAVENOUS

## 2015-11-02 MED ORDER — ARMC OPHTHALMIC DILATING GEL
1.0000 "application " | OPHTHALMIC | Status: DC | PRN
  Administered 2015-11-02 (×2): 1 via OPHTHALMIC

## 2015-11-02 MED ORDER — TETRACAINE HCL 0.5 % OP SOLN
OPHTHALMIC | Status: DC | PRN
  Administered 2015-11-02: 2 [drp] via OPHTHALMIC

## 2015-11-02 MED ORDER — NA HYALUR & NA CHOND-NA HYALUR 0.4-0.35 ML IO KIT
PACK | INTRAOCULAR | Status: DC | PRN
  Administered 2015-11-02: 1 mL via INTRAOCULAR

## 2015-11-02 MED ORDER — FENTANYL CITRATE (PF) 100 MCG/2ML IJ SOLN
INTRAMUSCULAR | Status: DC | PRN
  Administered 2015-11-02: 50 ug via INTRAVENOUS

## 2015-11-02 MED ORDER — EPINEPHRINE HCL 1 MG/ML IJ SOLN
INTRAOCULAR | Status: DC | PRN
  Administered 2015-11-02: 60 mL via OPHTHALMIC

## 2015-11-02 MED ORDER — LACTATED RINGERS IV SOLN: INTRAVENOUS | Status: DC

## 2015-11-02 MED ORDER — TETRACAINE HCL 0.5 % OP SOLN
1.0000 [drp] | OPHTHALMIC | Status: DC | PRN
  Administered 2015-11-02: 1 [drp] via OPHTHALMIC

## 2015-11-02 MED ORDER — POVIDONE-IODINE 5 % OP SOLN
1.0000 "application " | OPHTHALMIC | Status: DC | PRN
  Administered 2015-11-02: 1 via OPHTHALMIC

## 2015-11-02 MED ORDER — BRIMONIDINE TARTRATE 0.2 % OP SOLN
OPHTHALMIC | Status: DC | PRN
  Administered 2015-11-02: 1 [drp] via OPHTHALMIC

## 2015-11-02 MED ORDER — TIMOLOL MALEATE 0.5 % OP SOLN
OPHTHALMIC | Status: DC | PRN
  Administered 2015-11-02: 1 [drp] via OPHTHALMIC

## 2015-11-02 SURGICAL SUPPLY — 26 items
CANNULA ANT/CHMB 27GA (MISCELLANEOUS) ×3 IMPLANT
CARTRIDGE ABBOTT (MISCELLANEOUS) ×3 IMPLANT
GLOVE SURG LX 7.5 STRW (GLOVE) ×2
GLOVE SURG LX STRL 7.5 STRW (GLOVE) ×1 IMPLANT
GLOVE SURG TRIUMPH 8.0 PF LTX (GLOVE) ×3 IMPLANT
GOWN STRL REUS W/ TWL LRG LVL3 (GOWN DISPOSABLE) ×2 IMPLANT
GOWN STRL REUS W/TWL LRG LVL3 (GOWN DISPOSABLE) ×4
LENS IOL TECNIS ITEC 22.0 (Intraocular Lens) ×3 IMPLANT
MARKER SKIN DUAL TIP RULER LAB (MISCELLANEOUS) ×3 IMPLANT
NDL RETROBULBAR .5 NSTRL (NEEDLE) IMPLANT
NEEDLE FILTER BLUNT 18X 1/2SAF (NEEDLE) ×2
NEEDLE FILTER BLUNT 18X1 1/2 (NEEDLE) ×1 IMPLANT
PACK CATARACT BRASINGTON (MISCELLANEOUS) ×3 IMPLANT
PACK EYE AFTER SURG (MISCELLANEOUS) ×3 IMPLANT
PACK OPTHALMIC (MISCELLANEOUS) ×3 IMPLANT
RING MALYGIN 7.0 (MISCELLANEOUS) IMPLANT
SUT ETHILON 10-0 CS-B-6CS-B-6 (SUTURE)
SUT VICRYL  9 0 (SUTURE)
SUT VICRYL 9 0 (SUTURE) IMPLANT
SUTURE EHLN 10-0 CS-B-6CS-B-6 (SUTURE) IMPLANT
SYR 3ML LL SCALE MARK (SYRINGE) ×3 IMPLANT
SYR 5ML LL (SYRINGE) IMPLANT
SYR TB 1ML LUER SLIP (SYRINGE) ×3 IMPLANT
WATER STERILE IRR 250ML POUR (IV SOLUTION) ×3 IMPLANT
WATER STERILE IRR 500ML POUR (IV SOLUTION) IMPLANT
WIPE NON LINTING 3.25X3.25 (MISCELLANEOUS) ×3 IMPLANT

## 2015-11-02 NOTE — Transfer of Care (Signed)
Immediate Anesthesia Transfer of Care Note  Patient: Kellie Harris  Procedure(s) Performed: Procedure(s): CATARACT EXTRACTION PHACO AND INTRAOCULAR LENS PLACEMENT (IOC) (Right)  Patient Location: PACU  Anesthesia Type: MAC  Level of Consciousness: awake, alert  and patient cooperative  Airway and Oxygen Therapy: Patient Spontanous Breathing and Patient connected to supplemental oxygen  Post-op Assessment: Post-op Vital signs reviewed, Patient's Cardiovascular Status Stable, Respiratory Function Stable, Patent Airway and No signs of Nausea or vomiting  Post-op Vital Signs: Reviewed and stable  Complications: No apparent anesthesia complications

## 2015-11-02 NOTE — H&P (Signed)
  The History and Physical notes are on paper, have been signed, and are to be scanned. The patient remains stable and unchanged from the H&P.   Previous H&P reviewed, patient examined, and there are no changes.  Kellie Harris 11/02/2015 10:25 AM

## 2015-11-02 NOTE — Op Note (Signed)
LOCATION:  [name]   PREOPERATIVE DIAGNOSIS:    Nuclear sclerotic cataract right eye. H25.11   POSTOPERATIVE DIAGNOSIS:  Nuclear sclerotic cataract right eye.     PROCEDURE:  Phacoemusification with posterior chamber intraocular lens placement of the right eye   LENS:   Implant Name Type Inv. Item Serial No. Manufacturer Lot No. LRB No. Used  PCBoo  22.0 D     [id number] [employer]   Right 1        ULTRASOUND TIME: 18.7 % of 1 minutes, 7 seconds.  CDE 12.6   SURGEON:  [name], MD   ANESTHESIA:  Topical with tetracaine drops and 2% Xylocaine jelly.   COMPLICATIONS:  None.   DESCRIPTION OF PROCEDURE:  The patient was identified in the holding room and transported to the operating room and placed in the supine position under the operating microscope.  The right eye was identified as the operative eye and it was prepped and draped in the usual sterile ophthalmic fashion.   A 1 millimeter clear-corneal paracentesis was made at the 12:00 position.  The anterior chamber was filled with Viscoat viscoelastic.  A 2.4 millimeter keratome was used to make a near-clear corneal incision at the 9:00 position.  A curvilinear capsulorrhexis was made with a cystotome and capsulorrhexis forceps.  Balanced salt solution was used to hydrodissect and hydrodelineate the nucleus.   Phacoemulsification was then used in stop and chop fashion to remove the lens nucleus and epinucleus.  The remaining cortex was then removed using the irrigation and aspiration handpiece. Provisc was then placed into the capsular bag to distend it for lens placement.  A lens was then injected into the capsular bag.  The remaining viscoelastic was aspirated.   Wounds were hydrated with balanced salt solution.  The anterior chamber was inflated to a physiologic pressure with balanced salt solution.  No wound leaks were noted. Cefuroxime 0.1 ml of a 10mg /ml solution was injected into the anterior chamber for a  dose of 1 mg of intracameral antibiotic at the completion of the case.   Timolol and Brimonidine drops were applied to the eye.  The patient was taken to the recovery room in stable condition without complications of anesthesia or surgery.   Kellie Harris 11/02/2015, 11:45 AM

## 2015-11-02 NOTE — Anesthesia Procedure Notes (Signed)
Procedure Name: MAC Performed by: Tarahji Ramthun Pre-anesthesia Checklist: Patient identified, Emergency Drugs available, Suction available, Timeout performed and Patient being monitored Patient Re-evaluated:Patient Re-evaluated prior to inductionOxygen Delivery Method: Nasal cannula Placement Confirmation: positive ETCO2       

## 2015-11-02 NOTE — Anesthesia Postprocedure Evaluation (Signed)
Anesthesia Post Note  Patient: Kellie Harris  Procedure(s) Performed: Procedure(s) (LRB): CATARACT EXTRACTION PHACO AND INTRAOCULAR LENS PLACEMENT (IOC) (Right)  Patient location during evaluation: PACU Anesthesia Type: General Level of consciousness: awake and alert Pain management: pain level controlled Vital Signs Assessment: post-procedure vital signs reviewed and stable Respiratory status: spontaneous breathing, nonlabored ventilation, respiratory function stable and patient connected to nasal cannula oxygen Cardiovascular status: blood pressure returned to baseline and stable Postop Assessment: no signs of nausea or vomiting Anesthetic complications: no    Marshell Levan

## 2015-11-02 NOTE — Anesthesia Preprocedure Evaluation (Signed)
Anesthesia Evaluation  Patient identified by MRN, date of birth, ID band Patient awake    Airway Mallampati: II  TM Distance: >3 FB Neck ROM: Full    Dental   Pulmonary    Pulmonary exam normal        Cardiovascular hypertension, Normal cardiovascular exam     Neuro/Psych    GI/Hepatic   Endo/Other    Renal/GU      Musculoskeletal   Abdominal   Peds  Hematology   Anesthesia Other Findings   Reproductive/Obstetrics                            Anesthesia Physical Anesthesia Plan  ASA: II  Anesthesia Plan: MAC   Post-op Pain Management:    Induction: Intravenous  Airway Management Planned:   Additional Equipment:   Intra-op Plan:   Post-operative Plan:   Informed Consent: I have reviewed the patients History and Physical, chart, labs and discussed the procedure including the risks, benefits and alternatives for the proposed anesthesia with the patient or authorized representative who has indicated his/her understanding and acceptance.     Plan Discussed with: CRNA  Anesthesia Plan Comments:         Anesthesia Quick Evaluation  

## 2015-11-03 ENCOUNTER — Encounter: Payer: Self-pay | Admitting: Ophthalmology

## 2015-11-16 DIAGNOSIS — M1711 Unilateral primary osteoarthritis, right knee: Secondary | ICD-10-CM | POA: Diagnosis not present

## 2015-11-21 DIAGNOSIS — I10 Essential (primary) hypertension: Secondary | ICD-10-CM | POA: Diagnosis not present

## 2015-11-21 DIAGNOSIS — E7800 Pure hypercholesterolemia, unspecified: Secondary | ICD-10-CM | POA: Diagnosis not present

## 2015-11-29 DIAGNOSIS — E7800 Pure hypercholesterolemia, unspecified: Secondary | ICD-10-CM | POA: Diagnosis not present

## 2015-11-29 DIAGNOSIS — I10 Essential (primary) hypertension: Secondary | ICD-10-CM | POA: Diagnosis not present

## 2015-12-15 DIAGNOSIS — Z961 Presence of intraocular lens: Secondary | ICD-10-CM | POA: Diagnosis not present

## 2016-01-16 DIAGNOSIS — R197 Diarrhea, unspecified: Secondary | ICD-10-CM | POA: Diagnosis not present

## 2016-01-17 DIAGNOSIS — R197 Diarrhea, unspecified: Secondary | ICD-10-CM | POA: Diagnosis not present

## 2016-03-07 ENCOUNTER — Encounter: Payer: Self-pay | Admitting: Physical Therapy

## 2016-03-07 ENCOUNTER — Ambulatory Visit: Payer: PPO | Attending: Student | Admitting: Physical Therapy

## 2016-03-07 DIAGNOSIS — M791 Myalgia, unspecified site: Secondary | ICD-10-CM

## 2016-03-07 DIAGNOSIS — M6281 Muscle weakness (generalized): Secondary | ICD-10-CM

## 2016-03-07 DIAGNOSIS — R2689 Other abnormalities of gait and mobility: Secondary | ICD-10-CM | POA: Diagnosis not present

## 2016-03-07 NOTE — Patient Instructions (Addendum)
To improve diaphragm and pelvic floor coordination  1. Wear bra at the loosest end bracket  2. You are now ready to begin training the deep core muscles system: diaphragm, transverse abdominis, pelvic floor . These muscles must work together as a team.        The key to these exercises to train the brain to coordinate the timing of these muscles and to have them turn on for long periods of time to hold you upright against gravity (especially important if you are on your feet all day).These muscles are postural muscles and play a role stabilizing your spine and bodyweight. By doing these repetitions slowly and correctly instead of doing crunches, you will achieve a flatter belly without a lower pooch. You are also placing your spine in a more neutral position and breathing properly which in turn, decreases your risk for problems related to your pelvic floor, abdominal, and low back such as pelvic organ prolapse, hernias, diastasis recti (separation of superficial muscles), disk herniations, spinal fractures. These exercises set a solid foundation for you to later progress to resistance/ strength training with therabands and weights and return to other typical fitness exercises with a stronger deeper core.  Do breathing at the ribcage (10 x 2 reps) twice a day  Laying down   (less chest breathing)    Ways to decrease strain on pelvic floor and back   1. Avoid holding your breath when Getting out of the chair  Scoot to front half of the chair Heels under knee, nose over toes  Inhale like you are smelling roses Exhale to stand     2. Log roll out of bed

## 2016-03-08 DIAGNOSIS — M19012 Primary osteoarthritis, left shoulder: Secondary | ICD-10-CM | POA: Diagnosis not present

## 2016-03-08 NOTE — Therapy (Addendum)
[place] MAIN [facility] SERVICES [address] [place], [place], [address] Phone: [phone]   Fax:  [phone]  Physical Therapy Evaluation  Patient Details  Name: Kellie Harris MRN: [id number] Date of Birth: 20-Aug-1930 Referring Provider: Tammi Klippel   Encounter Date: 03/07/2016      PT End of Session - 03/08/16 2149    Visit Number 1   Number of Visits 12   Date for PT Re-Evaluation 05/24/16   Authorization Type 1/10 g code   PT Start Time 1400   PT Stop Time [id number]   PT Time Calculation (min) 59 min   Activity Tolerance Patient tolerated treatment well;No increased pain   Behavior During Therapy WFL for tasks assessed/performed      Past Medical History:  Diagnosis Date  . Arthritis    right knee, left shoulder  . Cancer ([place])    skin   . Degenerative disc disease, lumbar    L4-L5  . Diverticulosis   . History of hiatal hernia   . Hydronephrosis    right  . Hypercholesteremia   . Hypertension   . IBS (irritable bowel syndrome)   . Knee fracture, right 03/05/15  . Ovarian cyst   . Pancreatic insufficiency ([place])   . Wears hearing aid    bilateral    Past Surgical History:  Procedure Laterality Date  . CATARACT EXTRACTION W/PHACO Right 11/02/2015   Procedure: CATARACT EXTRACTION PHACO AND INTRAOCULAR LENS PLACEMENT (IOC);  Surgeon: Leandrew Koyanagi, MD;  Location: [facility];  Service: Ophthalmology;  Laterality: Right;  . COLONOSCOPY    . kidney stent    . OOPHORECTOMY Bilateral   . TONSILLECTOMY      There were no vitals filed for this visit.       Subjective Assessment - 03/08/16 2200    Subjective 1) Pt 's fecal urgency started 5 years ago and currently it has improved with medicatons, diet modifications, and peppermint oil capsule. Pt reports she has stress urinary incontinence occasionally. 2)  Pt reported she has had a recent R knee fracture in her tibia 03/04/2015. She currently wears a brace and is  allowed to put full weight on it.  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     Pertinent History Hx of 2 vaginal deliveries without stitiches nor traumas. Abdominal surgeries: neg.  Laproscopic surgery to remove ovaries due to benign cysts in 2015.             [facility] PT Assessment - 03/08/16 0825      Assessment   Medical Diagnosis fecal incontinence    Referring Provider Tammi Klippel      Precautions   Precautions None     Restrictions   Weight Bearing Restrictions No     Balance Screen   Has the patient fallen in the past 6 months No     Observation/Other Assessments   Observations sits with genu valgus    Other Surveys  --  Bowel Function in PFDI 12%,      Coordination   Gross Motor Movements are Fluid and Coordinated --  chest breathing, bra strap too tight to allow for diaphragma   Fine Motor Movements are Fluid and Coordinated --  overuse of glut/ add for pelvic floor activation, abdominal      Posture/Postural Control   Posture Comments slight thoracic kyphosis  Bed Mobility   Bed Mobility --  half crunch     Ambulation/Gait   Gait velocity 0.68 m/s   Gait Comments decreased stance on R                   Pelvic Floor Special Questions - 03/08/16 2122    Pelvic Floor Internal Exam pt consented verbally without contraindication   Exam Type Vaginal   Palpation increased mm tension 1-3 layers bilaterally    Strength --  deferred, worked on achieving ROM, lengthening w/ diaphragm           [facility] Adult PT Treatment/Exercise - 03/08/16 0825      Therapeutic Activites    Therapeutic Activities --  see pt instructions     Manual Therapy   Manual therapy comments internal pelvic floor:  thiele massage                 PT Education - 03/08/16 2148    Education provided Yes   Education Details POC, anatomy/physiology, body mechanics,  breathing mechanics, deep core coordination , goals   Person(s) Educated Patient   Methods Explanation;Demonstration;Tactile cues;Verbal cues;Handout   Comprehension Returned demonstration;Verbalized understanding             PT Long Term Goals - 03/08/16 2214      PT LONG TERM GOAL #1   Title Pt will decrease her questionnaire score on bowel function portion on PFDI from 12% to < 10%  in order to improve bowel function,    Time 12   Period Weeks   Status New     PT LONG TERM GOAL #2   Title Pt will demo inreased gait speed from 0.68 m/s to > 1.2 m/s  in order to ambulate safely across streets   Time 12   Period Weeks   Status New     PT LONG TERM GOAL #3   Title Pt will demo proper pelvic floor ROM (decreased mm tensions)  with diaphragmatic breathing without cues   Time 12   Period Weeks   Status New               Plan - 03/08/16 2156    Clinical Impression Statement Pt is an 82 yo female who was referred to [facility] PT for fecal incontinence which she states has improved. However, she reports ocassional SUI and R knee pain 2/2 a fracture. Pt's clincial presentations include pelvic floor overactivity, dyscoordination of deep core mm tensions,  genu valgus in sitting posture / sit to stand transfers/ standing postures,  generalized weakness, and gait deviations.   Patient will benefit from skilled therapeutic intervention in order to improve the following deficits and impairments:  Abnormal gait, Pain, Improper body mechanics, Decreased coordination, Postural dysfunction, Increased muscle spasms, Decreased mobility, Decreased endurance, Decreased activity tolerance, Decreased range of motion, Decreased strength, Difficulty walking, Decreased balance   Rehab Potential Good   Clinical Impairments Affecting Rehab Potential Hx of 2 vaginal deliveries without stitiches nor traumas. Abdominal surgeries: neg.  Laproscopic surgery to remove ovaries due to benign cysts in  2015.  R Knee fracture     PT Frequency 1x / week   PT Treatment/Interventions ADLs/Self Care Home Management;Aquatic Therapy;Cryotherapy;Lobbyist;Therapeutic exercise;Therapeutic activities;Functional mobility training;Stair training;Gait training;Neuromuscular re-education;Patient/family education;Scar mobilization;Manual techniques;Manual lymph drainage;Passive range of motion;Energy conservation;Taping   Consulted and Agree with Plan of Care Patient      Patient will benefit from skilled therapeutic intervention in order to improve  the following deficits and impairments:  Abnormal gait, Pain, Improper body mechanics, Decreased coordination, Postural dysfunction, Increased muscle spasms, Decreased mobility, Decreased endurance, Decreased activity tolerance, Decreased range of motion, Decreased strength, Difficulty walking, Decreased balance  Visit Diagnosis: Myalgia  Other abnormalities of gait and mobility  Muscle weakness (generalized)      G-Codes - 31-Mar-2016 12-06-18    Functional Assessment Tool Used PFDI bowel function 12%, gait speed 0.68 m/s   Functional Limitation Mobility: Walking and moving around   Mobility: Walking and Moving Around Current Status [phone]) At least 40 percent but less than 60 percent impaired, limited or restricted   Mobility: Walking and Moving Around Goal Status [phone]) At least 20 percent but less than 40 percent impaired, limited or restricted       Problem List There are no active problems to display for this patient.   Jerl Mina ,PT, DPT, E-RYT  03/08/2016, 10:21 PM  [place] MAIN [facility] SERVICES [address] [place], [place], [address] Phone: [phone]   Fax:  [phone]  Name: Kellie Harris MRN: [id number] Date of Birth: 23-Jun-1931

## 2016-03-12 ENCOUNTER — Ambulatory Visit: Payer: PPO | Admitting: Physical Therapy

## 2016-03-12 DIAGNOSIS — R2689 Other abnormalities of gait and mobility: Secondary | ICD-10-CM

## 2016-03-12 DIAGNOSIS — M791 Myalgia, unspecified site: Secondary | ICD-10-CM

## 2016-03-12 DIAGNOSIS — M6281 Muscle weakness (generalized): Secondary | ICD-10-CM

## 2016-03-12 NOTE — Patient Instructions (Signed)
PELVIC FLOOR / KEGEL EXERCISES   Pelvic floor/ Kegel exercises are used to strengthen the muscles in the base of your pelvis that are responsible for supporting your pelvic organs and preventing urine/feces leakage. Based on your therapist's recommendations, they can be performed while standing, sitting, or lying down. Imagine pelvic floor area as a diamond with pelvic landmarks: top =pubic bone, bottom tip=tailbone, sides=sitting bones (ischial tuberosities).    Make yourself aware of this muscle group by using these cues while coordinating your breath:  Inhale, feel pelvic floor diamond area lower like hammock towards your feet and ribcage/belly expanding. Pause. Let the exhale naturally and feel your belly sink, abdominal muscles hugging in around you and you may notice the pelvic diamond draws upward towards your head forming a umbrella shape. Give a squeeze during the exhalation like you are stopping the flow of urine. If you are squeezing the buttock muscles, try to give 50% less effort.   Common Errors:  Breath holding: If you are holding your breath, you may be bearing down against your bladder instead of pulling it up. If you belly bulges up while you are squeezing, you are holding your breath. Be sure to breathe gently in and out while exercising. Counting out loud may help you avoid holding your breath.  Accessory muscle use: You should not see or feel other muscle movement when performing pelvic floor exercises. When done properly, no one can tell that you are performing the exercises. Keep the buttocks, belly and inner thighs relaxed.  Overdoing it: Your muscles can fatigue and stop working for you if you over-exercise. You may actually leak more or feel soreness at the lower abdomen or rectum.  YOUR HOME EXERCISE PROGRAM   SHORT HOLDS: Position: on back, sitting   Inhale and then exhale.   (Be sure to let belly sink in with exhales and not push outward)  And try not to overuse  the buttock or thigh muscles.   Perform 5 repetitions, 5  Times/day                      DECREASE DOWNWARD PRESSURE ON  YOUR PELVIC FLOOR, ABDOMINAL, LOW BACK MUSCLES       PRESERVE YOUR PELVIC HEALTH LONG-TERM   ** SQUEEZE pelvic floor BEFORE YOUR SNEEZE, COUGH, LAUGH   ** EXHALE BEFORE YOU RISE AGAINST GRAVITY (lifting, sit to stand, from squat to stand)   ** LOG ROLL OUT OF BED INSTEAD OF CRUNCH/SIT-UP     ___________  You are now ready to begin training the deep core muscles system: diaphragm, transverse abdominis, pelvic floor . These muscles must work together as a team.        The key to these exercises to train the brain to coordinate the timing of these muscles and to have them turn on for long periods of time to hold you upright against gravity (especially important if you are on your feet all day).These muscles are postural muscles and play a role stabilizing your spine and bodyweight. By doing these repetitions slowly and correctly instead of doing crunches, you will achieve a flatter belly without a lower pooch. You are also placing your spine in a more neutral position and breathing properly which in turn, decreases your risk for problems related to your pelvic floor, abdominal, and low back such as pelvic organ prolapse, hernias, diastasis recti (separation of superficial muscles), disk herniations, spinal fractures. These exercises set a solid foundation for you to later  progress to resistance/ strength training with therabands and weights and return to other typical fitness exercises with a stronger deeper core.    Level two, 30 reps x twice a day

## 2016-03-12 NOTE — Therapy (Signed)
[place] MAIN [facility] SERVICES [address] [place], [place], [address] Phone: [phone]   Fax:  [phone]  Physical Therapy Treatment  Patient Details  Name: Kellie Harris MRN: [id number] Date of Birth: [birth year] Referring Provider: Tammi Klippel   Encounter Date: 03/12/2016      PT End of Session - 03/12/16 1428    Visit Number 2   Number of Visits 12   Date for PT Re-Evaluation 05/24/16   Authorization Type 2/10 g code   PT Start Time 1400   PT Stop Time [id number]   PT Time Calculation (min) 45 min   Activity Tolerance Patient tolerated treatment well;No increased pain   Behavior During Therapy WFL for tasks assessed/performed      Past Medical History:  Diagnosis Date  . Arthritis    right knee, left shoulder  . Cancer ([place])    skin   . Degenerative disc disease, lumbar    L4-L5  . Diverticulosis   . History of hiatal hernia   . Hydronephrosis    right  . Hypercholesteremia   . Hypertension   . IBS (irritable bowel syndrome)   . Knee fracture, right 03/05/15  . Ovarian cyst   . Pancreatic insufficiency ([place])   . Wears hearing aid    bilateral    Past Surgical History:  Procedure Laterality Date  . CATARACT EXTRACTION W/PHACO Right 11/02/2015   Procedure: CATARACT EXTRACTION PHACO AND INTRAOCULAR LENS PLACEMENT (IOC);  Surgeon: Leandrew Koyanagi, MD;  Location: [facility];  Service: Ophthalmology;  Laterality: Right;  . COLONOSCOPY    . kidney stent    . OOPHORECTOMY Bilateral   . TONSILLECTOMY      There were no vitals filed for this visit.      Subjective Assessment - 03/12/16 1353    Subjective Pt has seen her orthopedic MD and she will rather return to her PT through his office for her R knee issue.  Pt is unsure whether she is practicing the breathing technique correctly   Pertinent History Hx of 2 vaginal deliveries without stitiches nor traumas. Abdominal surgeries: neg.  Laproscopic surgery to  remove ovaries due to benign cysts in 2015.             [facility] PT Assessment - 03/12/16 2326      Observation/Other Assessments   Observations good carry over with sit to stand but continues to sit with genu valgus     Coordination   Gross Motor Movements are Fluid and Coordinated --  good carry  over with breathing coordination     Posture/Postural Control   Posture Comments able to perform deep core level 2 with minimal cues                  Pelvic Floor Special Questions - 03/12/16 1417    Pelvic Floor Internal Exam pt consented verbally without contraindication   Exam Type Vaginal   Palpation decreased mm tension all layers, pt elicited proper ROM but used accessory mm (gluts/adductors) w/ cue for pelvic floor contraction   Strength weak squeeze, no lift           [facility] Adult PT Treatment/Exercise - 03/12/16 2326      Neuro Re-ed    Neuro Re-ed Details  see pt instructions and less overuse with accessory mm with pelvic floor activation                PT Education - 03/12/16 1427  Education provided Yes   Education Details HEP   Person(s) Educated Patient   Methods Explanation;Demonstration;Tactile cues;Handout;Verbal cues   Comprehension Returned demonstration;Verbalized understanding             PT Long Term Goals - 03/12/16 1429      PT LONG TERM GOAL #1   Title Pt will decrease her questionnaire score on bowel function portion on PFDI from 12% to < 10%  in order to improve bowel function,    Time 12   Period Weeks   Status On-going     PT LONG TERM GOAL #2   Title Pt will demo inreased gait speed from 0.68 m/s to > 1.2 m/s  in order to ambulate safely across streets   Time 12   Period Weeks   Status New     PT LONG TERM GOAL #3   Title Pt will demo proper pelvic floor ROM (decreased mm tensions)  with diaphragmatic breathing without cues   Time 12   Period Weeks   Status Achieved               Plan - 03/12/16  2328    Clinical Impression Statement Pt showed good carry over with breathing coordination with pelvic floor relaxation as she showed no pelvic floor mm tensions compared to last visit. Pt was withheld from pelvic floor endurance training due to pt's previous presentation of overactive pelvic floor,  Overuse of accessory mm, and dyscoordination of deep core mm. Pt was progressed with deep core strengthening to continue building on proper coordination.  Pt was educated on benefits of deep core strengthening approach to rehab for her pelvic floor Sx as well as for her knee rehab. Pt  voiced understanding with an interest to return in a month for pelvic floor rehab and preferred to seek Tx from her previous orthopedic PT for her R knee Sx.  Pt will continue to benefit from skilled PT and is scheduled to return in a month.     Rehab Potential Good   Clinical Impairments Affecting Rehab Potential Hx of 2 vaginal deliveries without stitiches nor traumas. Abdominal surgeries: neg.  Laproscopic surgery to remove ovaries due to benign cysts in 2015.  R Knee fracture     PT Frequency 1x / week   PT Treatment/Interventions ADLs/Self Care Home Management;Aquatic Therapy;Cryotherapy;Lobbyist;Therapeutic exercise;Therapeutic activities;Functional mobility training;Stair training;Gait training;Neuromuscular re-education;Patient/family education;Scar mobilization;Manual techniques;Manual lymph drainage;Passive range of motion;Energy conservation;Taping   Consulted and Agree with Plan of Care Patient      Patient will benefit from skilled therapeutic intervention in order to improve the following deficits and impairments:  Abnormal gait, Pain, Improper body mechanics, Decreased coordination, Postural dysfunction, Increased muscle spasms, Decreased mobility, Decreased endurance, Decreased activity tolerance, Decreased range of motion, Decreased strength, Difficulty walking, Decreased  balance  Visit Diagnosis: Myalgia  Other abnormalities of gait and mobility  Muscle weakness (generalized)     Problem List There are no active problems to display for this patient.   Jerl Mina ,PT, DPT, E-RYT  03/12/2016, 11:34 PM  [place] MAIN [facility] SERVICES [address] [place], [place], [address] Phone: [phone]   Fax:  [phone]  Name: Auline Kuyper MRN: [id number] Date of Birth: [birth year]

## 2016-03-21 ENCOUNTER — Encounter: Payer: PPO | Admitting: Physical Therapy

## 2016-03-28 ENCOUNTER — Encounter: Payer: PPO | Admitting: Physical Therapy

## 2016-04-04 ENCOUNTER — Encounter: Payer: PPO | Admitting: Physical Therapy

## 2016-04-20 ENCOUNTER — Ambulatory Visit: Payer: PPO | Attending: Student | Admitting: Physical Therapy

## 2016-04-20 DIAGNOSIS — M791 Myalgia, unspecified site: Secondary | ICD-10-CM

## 2016-04-20 DIAGNOSIS — R2689 Other abnormalities of gait and mobility: Secondary | ICD-10-CM | POA: Insufficient documentation

## 2016-04-20 DIAGNOSIS — M6281 Muscle weakness (generalized): Secondary | ICD-10-CM | POA: Insufficient documentation

## 2016-04-20 NOTE — Therapy (Signed)
[place] MAIN [facility] SERVICES [address] [place], [place], [address] Phone: [phone]   Fax:  [phone]  Physical Therapy Treatment / Discharge Summary   Patient Details  Name: Kellie Harris MRN: [id number] Date of Birth: [birth year] Referring Provider: Tammi Klippel   Encounter Date: 04/20/2016      PT End of Session - 04/20/16 1330    Visit Number 3   Number of Visits 12   Date for PT Re-Evaluation 05/24/16   Authorization Type 3/10 g code   PT Start Time 7948   PT Stop Time 1320   PT Time Calculation (min) 15 min   Activity Tolerance Patient tolerated treatment well;No increased pain   Behavior During Therapy WFL for tasks assessed/performed      Past Medical History:  Diagnosis Date  . Arthritis    right knee, left shoulder  . Cancer ([place])    skin   . Degenerative disc disease, lumbar    L4-L5  . Diverticulosis   . History of hiatal hernia   . Hydronephrosis    right  . Hypercholesteremia   . Hypertension   . IBS (irritable bowel syndrome)   . Knee fracture, right 03/05/15  . Ovarian cyst   . Pancreatic insufficiency ([place])   . Wears hearing aid    bilateral    Past Surgical History:  Procedure Laterality Date  . CATARACT EXTRACTION W/PHACO Right 11/02/2015   Procedure: CATARACT EXTRACTION PHACO AND INTRAOCULAR LENS PLACEMENT (IOC);  Surgeon: Leandrew Koyanagi, MD;  Location: [place];  Service: Ophthalmology;  Laterality: Right;  . COLONOSCOPY    . kidney stent    . OOPHORECTOMY Bilateral   . TONSILLECTOMY      There were no vitals filed for this visit.      Subjective Assessment - 04/20/16 1309    Subjective Pt reported she has not been as faithful to doing her HEP as she should be. Pt reported no IBS flare-ups which also meant no fecal incontinence episodes.   Pertinent History Hx of 2 vaginal deliveries without stitiches nor traumas. Abdominal surgeries: neg.  Laproscopic surgery to remove  ovaries due to benign cysts in 2015.             [facility] PT Assessment - 04/20/16 1327      Palpation   Palpation comment external palpation of pelvic floor through clothes, required cuing for decreased accessory mm.       Ambulation/Gait   Gait velocity 1.0 m/s                      [facility] Adult PT Treatment/Exercise - 04/20/16 1327      Therapeutic Activites    Therapeutic Activities --  discussed d/c and reviewed pelvic floor activation                PT Education - 04/20/16 1328    Education provided Yes   Education Details d/c   Person(s) Educated Patient   Methods Explanation;Demonstration;Verbal cues;Tactile cues   Comprehension Verbalized understanding;Returned demonstration             PT Long Term Goals - 04/20/16 1310      PT LONG TERM GOAL #1   Title Pt will decrease her questionnaire score on bowel function portion on PFDI from 12% to < 10%  in order to improve bowel function,    Time 12   Period Weeks   Status Partially Met  PT LONG TERM GOAL #2   Title Pt will demo inreased gait speed from 0.68 m/s to > 1.2 m/s  in order to ambulate safely across streets   Time 12   Period Weeks   Status Partially Met     PT LONG TERM GOAL #3   Title Pt will demo proper pelvic floor ROM (decreased mm tensions)  with diaphragmatic breathing without cues   Time 12   Period Weeks   Status Achieved               Plan - 04/29/2016 1329    Clinical Impression Statement Pt has achieved 1/3 goals and partially met her remaining two goals. Pt reports she has not had any fecal smearing because she has not had any IBS flare-ups.  Pt demo'd proper pelvic floor coordination after training but required cuing today to decrease overuse of accessory muscles. Pt is ready for d/c. Thank you for your referral.    Rehab Potential Good   Clinical Impairments Affecting Rehab Potential Hx of 2 vaginal deliveries without stitiches nor traumas. Abdominal  surgeries: neg.  Laproscopic surgery to remove ovaries due to benign cysts in 2015.  R Knee fracture     PT Frequency 1x / week   PT Treatment/Interventions ADLs/Self Care Home Management;Aquatic Therapy;Cryotherapy;Lobbyist;Therapeutic exercise;Therapeutic activities;Functional mobility training;Stair training;Gait training;Neuromuscular re-education;Patient/family education;Scar mobilization;Manual techniques;Manual lymph drainage;Passive range of motion;Energy conservation;Taping   Consulted and Agree with Plan of Care Patient      Patient will benefit from skilled therapeutic intervention in order to improve the following deficits and impairments:  Abnormal gait, Pain, Improper body mechanics, Decreased coordination, Postural dysfunction, Increased muscle spasms, Decreased mobility, Decreased endurance, Decreased activity tolerance, Decreased range of motion, Decreased strength, Difficulty walking, Decreased balance  Visit Diagnosis: Myalgia  Other abnormalities of gait and mobility  Muscle weakness (generalized)       G-Codes - 2016-04-29 1333    Functional Assessment Tool Used  gait speed 1.0 m/s, clincial judgement   Functional Limitation Mobility: Walking and moving around   Mobility: Walking and Moving Around Current Status [phone]) At least 20 percent but less than 40 percent impaired, limited or restricted   Mobility: Walking and Moving Around Goal Status [phone]) At least 20 percent but less than 40 percent impaired, limited or restricted   Mobility: Walking and Moving Around Discharge Status [phone]) At least 20 percent but less than 40 percent impaired, limited or restricted      Problem List There are no active problems to display for this patient.   Jerl Mina ,PT, DPT, E-RYT  04-29-2016, 1:34 PM  [place] MAIN [facility] SERVICES [address] [place], [place], [address] Phone: [phone]   Fax:   [phone]  Name: Kellie Harris MRN: [id number] Date of Birth: [birth year]

## 2016-04-26 DIAGNOSIS — M13862 Other specified arthritis, left knee: Secondary | ICD-10-CM | POA: Diagnosis not present

## 2016-05-07 ENCOUNTER — Encounter: Payer: PPO | Admitting: Physical Therapy

## 2016-05-16 DIAGNOSIS — M1711 Unilateral primary osteoarthritis, right knee: Secondary | ICD-10-CM | POA: Diagnosis not present

## 2016-05-21 ENCOUNTER — Encounter: Payer: PPO | Admitting: Physical Therapy

## 2016-05-23 DIAGNOSIS — M1711 Unilateral primary osteoarthritis, right knee: Secondary | ICD-10-CM | POA: Diagnosis not present

## 2016-05-24 ENCOUNTER — Other Ambulatory Visit: Payer: Self-pay | Admitting: Family Medicine

## 2016-05-24 DIAGNOSIS — Z1231 Encounter for screening mammogram for malignant neoplasm of breast: Secondary | ICD-10-CM

## 2016-05-30 DIAGNOSIS — M1711 Unilateral primary osteoarthritis, right knee: Secondary | ICD-10-CM | POA: Diagnosis not present

## 2016-06-06 DIAGNOSIS — M170 Bilateral primary osteoarthritis of knee: Secondary | ICD-10-CM | POA: Diagnosis not present

## 2016-06-14 DIAGNOSIS — H2512 Age-related nuclear cataract, left eye: Secondary | ICD-10-CM | POA: Diagnosis not present

## 2016-06-22 ENCOUNTER — Ambulatory Visit
Admission: RE | Admit: 2016-06-22 | Discharge: 2016-06-22 | Disposition: A | Discharge status: Home / Self Care | Payer: PPO | Source: Ambulatory Visit | Attending: Family Medicine | Admitting: Family Medicine

## 2016-06-22 DIAGNOSIS — I10 Essential (primary) hypertension: Secondary | ICD-10-CM | POA: Diagnosis not present

## 2016-06-22 DIAGNOSIS — Z1231 Encounter for screening mammogram for malignant neoplasm of breast: Secondary | ICD-10-CM

## 2016-06-22 DIAGNOSIS — E7800 Pure hypercholesterolemia, unspecified: Secondary | ICD-10-CM | POA: Diagnosis not present

## 2016-07-04 DIAGNOSIS — M1711 Unilateral primary osteoarthritis, right knee: Secondary | ICD-10-CM | POA: Diagnosis not present

## 2016-07-10 DIAGNOSIS — E7800 Pure hypercholesterolemia, unspecified: Secondary | ICD-10-CM | POA: Diagnosis not present

## 2016-07-10 DIAGNOSIS — I10 Essential (primary) hypertension: Secondary | ICD-10-CM | POA: Diagnosis not present

## 2016-07-10 DIAGNOSIS — Z23 Encounter for immunization: Secondary | ICD-10-CM | POA: Diagnosis not present

## 2016-07-10 DIAGNOSIS — Z0000 Encounter for general adult medical examination without abnormal findings: Secondary | ICD-10-CM | POA: Diagnosis not present

## 2016-07-11 DIAGNOSIS — H2512 Age-related nuclear cataract, left eye: Secondary | ICD-10-CM | POA: Diagnosis not present

## 2016-07-12 NOTE — Discharge Instructions (Signed)
Cataract Surgery, Care After °Refer to this sheet in the next few weeks. These instructions provide you with information about caring for yourself after your procedure. Your health care provider may also give you more specific instructions. Your treatment has been planned according to current medical practices, but problems sometimes occur. Call your health care provider if you have any problems or questions after your procedure. °What can I expect after the procedure? °After the procedure, it is common to have: °· Itching. °· Discomfort. °· Fluid discharge. °· Sensitivity to light and to touch. °· Bruising. °Follow these instructions at home: °Eye Care  °· Check your eye every day for signs of infection. Watch for: °¨ Redness, swelling, or pain. °¨ Fluid, blood, or pus. °¨ Warmth. °¨ Bad smell. °Activity  °· Avoid strenuous activities, such as playing contact sports, for as long as told by your health care provider. °· Do not drive or operate heavy machinery until your health care provider approves. °· Do not bend or lift heavy objects . Bending increases pressure in the eye. You can walk, climb stairs, and do light household chores. °· Ask your health care provider when you can return to work. If you work in a dusty environment, you may be advised to wear protective eyewear for a period of time. °General instructions  °· Take or apply over-the-counter and prescription medicines only as told by your health care provider. This includes eye drops. °· Do not touch or rub your eyes. °· If you were given a protective shield, wear it as told by your health care provider. If you were not given a protective shield, wear sunglasses as told by your health care provider to protect your eyes. °· Keep the area around your eye clean and [place]. Avoid swimming or allowing water to hit you directly in the face while showering until told by your health care provider. Keep soap and shampoo out of your eyes. °· Do not put a contact lens  into the affected eye or eyes until your health care provider approves. °· Keep all follow-up visits as told by your health care provider. This is important. °Contact a health care provider if: ° °· You have increased bruising around your eye. °· You have pain that is not helped with medicine. °· You have a fever. °· You have redness, swelling, or pain in your eye. °· You have fluid, blood, or pus coming from your incision. °· Your vision gets worse. °Get help right away if: °· You have sudden vision loss. °This information is not intended to replace advice given to you by your health care provider. Make sure you discuss any questions you have with your health care provider. °Document Released: 02/16/2005 Document Revised: 12/08/2015 Document Reviewed: 06/09/2015 °Elsevier Interactive Patient Education © 2017 [employer] ° ° ° ° °General Anesthesia, Adult, Care After °These instructions provide you with information about caring for yourself after your procedure. Your health care provider may also give you more specific instructions. Your treatment has been planned according to current medical practices, but problems sometimes occur. Call your health care provider if you have any problems or questions after your procedure. °What can I expect after the procedure? °After the procedure, it is common to have: °· Vomiting. °· A sore throat. °· Mental slowness. °It is common to feel: °· Nauseous. °· Cold or shivery. °· Sleepy. °· Tired. °· Sore or achy, even in parts of your body where you did not have surgery. °Follow these instructions at   home: °For at least 24 hours after the procedure:  °· Do not: °¨ Participate in activities where you could fall or become injured. °¨ Drive. °¨ Use heavy machinery. °¨ Drink alcohol. °¨ Take sleeping pills or medicines that cause drowsiness. °¨ Make important decisions or sign legal documents. °¨ Take care of children on your own. °· Rest. °Eating and drinking  °· If you vomit, drink  water, juice, or soup when you can drink without vomiting. °· Drink enough fluid to keep your urine clear or pale yellow. °· Make sure you have little or no nausea before eating solid foods. °· Follow the diet recommended by your health care provider. °General instructions  °· Have a responsible adult stay with you until you are awake and alert. °· Return to your normal activities as told by your health care provider. Ask your health care provider what activities are safe for you. °· Take over-the-counter and prescription medicines only as told by your health care provider. °· If you smoke, do not smoke without supervision. °· Keep all follow-up visits as told by your health care provider. This is important. °Contact a health care provider if: °· You continue to have nausea or vomiting at home, and medicines are not helpful. °· You cannot drink fluids or start eating again. °· You cannot urinate after 8-12 hours. °· You develop a skin rash. °· You have fever. °· You have increasing redness at the site of your procedure. °Get help right away if: °· You have difficulty breathing. °· You have chest pain. °· You have unexpected bleeding. °· You feel that you are having a life-threatening or urgent problem. °This information is not intended to replace advice given to you by your health care provider. Make sure you discuss any questions you have with your health care provider. °Document Released: 11/05/2000 Document Revised: 01/02/2016 Document Reviewed: 07/14/2015 °Elsevier Interactive Patient Education © 2017 Elsevier Inc. ° °

## 2016-07-18 ENCOUNTER — Encounter
Admission: RE | Disposition: A | Discharge status: Home / Self Care | Payer: Self-pay | Source: Ambulatory Visit | Attending: Ophthalmology

## 2016-07-18 ENCOUNTER — Ambulatory Visit
Admission: RE | Admit: 2016-07-18 | Discharge: 2016-07-18 | Disposition: A | Discharge status: Home / Self Care | Payer: PPO | Source: Ambulatory Visit | Attending: Ophthalmology | Admitting: Ophthalmology

## 2016-07-18 ENCOUNTER — Ambulatory Visit: Payer: PPO | Admitting: Anesthesiology

## 2016-07-18 DIAGNOSIS — I10 Essential (primary) hypertension: Secondary | ICD-10-CM | POA: Insufficient documentation

## 2016-07-18 DIAGNOSIS — E7800 Pure hypercholesterolemia, unspecified: Secondary | ICD-10-CM | POA: Diagnosis not present

## 2016-07-18 DIAGNOSIS — Z791 Long term (current) use of non-steroidal anti-inflammatories (NSAID): Secondary | ICD-10-CM | POA: Diagnosis not present

## 2016-07-18 DIAGNOSIS — Z79899 Other long term (current) drug therapy: Secondary | ICD-10-CM | POA: Diagnosis not present

## 2016-07-18 DIAGNOSIS — H2512 Age-related nuclear cataract, left eye: Secondary | ICD-10-CM | POA: Diagnosis not present

## 2016-07-18 DIAGNOSIS — Z7982 Long term (current) use of aspirin: Secondary | ICD-10-CM | POA: Insufficient documentation

## 2016-07-18 HISTORY — DX: Other specified disorders of bone density and structure, unspecified site: M85.80

## 2016-07-18 HISTORY — PX: CATARACT EXTRACTION W/PHACO: SHX586

## 2016-07-18 SURGERY — PHACOEMULSIFICATION, CATARACT, WITH IOL INSERTION
Anesthesia: Monitor Anesthesia Care | Laterality: Left | Wound class: Clean

## 2016-07-18 MED ORDER — POLYMYXIN B-TRIMETHOPRIM 10000-0.1 UNIT/ML-% OP SOLN: 1.0000 [drp] | OPHTHALMIC | Status: DC | PRN

## 2016-07-18 MED ORDER — BRIMONIDINE TARTRATE 0.2 % OP SOLN
OPHTHALMIC | Status: DC | PRN
  Administered 2016-07-18: 1 [drp] via OPHTHALMIC

## 2016-07-18 MED ORDER — EPINEPHRINE PF 1 MG/ML IJ SOLN
INTRAOCULAR | Status: DC | PRN
  Administered 2016-07-18: 60 mL via OPHTHALMIC

## 2016-07-18 MED ORDER — LACTATED RINGERS IV SOLN: INTRAVENOUS | Status: DC

## 2016-07-18 MED ORDER — TIMOLOL MALEATE 0.5 % OP SOLN
OPHTHALMIC | Status: DC | PRN
  Administered 2016-07-18: 1 [drp] via OPHTHALMIC

## 2016-07-18 MED ORDER — NA HYALUR & NA CHOND-NA HYALUR 0.4-0.35 ML IO KIT
PACK | INTRAOCULAR | Status: DC | PRN
  Administered 2016-07-18: 1 mL via INTRAOCULAR

## 2016-07-18 MED ORDER — MOXIFLOXACIN HCL 0.5 % OP SOLN
1.0000 [drp] | OPHTHALMIC | Status: DC | PRN
  Administered 2016-07-18 (×3): 1 [drp] via OPHTHALMIC

## 2016-07-18 MED ORDER — ACETAMINOPHEN 160 MG/5ML PO SOLN: 325.0000 mg | ORAL | Status: DC | PRN

## 2016-07-18 MED ORDER — ARMC OPHTHALMIC DILATING DROPS
1.0000 "application " | OPHTHALMIC | Status: DC | PRN
  Administered 2016-07-18 (×3): 1 via OPHTHALMIC

## 2016-07-18 MED ORDER — ACETAMINOPHEN 325 MG PO TABS: 325.0000 mg | ORAL_TABLET | ORAL | Status: DC | PRN

## 2016-07-18 MED ORDER — MIDAZOLAM HCL 2 MG/2ML IJ SOLN
INTRAMUSCULAR | Status: DC | PRN
  Administered 2016-07-18: 2 mg via INTRAVENOUS

## 2016-07-18 MED ORDER — LIDOCAINE HCL (PF) 4 % IJ SOLN
INTRAOCULAR | Status: DC | PRN
  Administered 2016-07-18: 1 mL via OPHTHALMIC

## 2016-07-18 MED ORDER — FENTANYL CITRATE (PF) 100 MCG/2ML IJ SOLN
INTRAMUSCULAR | Status: DC | PRN
  Administered 2016-07-18: 50 ug via INTRAVENOUS

## 2016-07-18 MED ORDER — CEFUROXIME OPHTHALMIC INJECTION 1 MG/0.1 ML
INJECTION | OPHTHALMIC | Status: DC | PRN
  Administered 2016-07-18: 0.1 mL via OPHTHALMIC

## 2016-07-18 SURGICAL SUPPLY — 25 items
CANNULA ANT/CHMB 27GA (MISCELLANEOUS) ×3 IMPLANT
CARTRIDGE ABBOTT (MISCELLANEOUS) IMPLANT
GLOVE SURG LX 7.5 STRW (GLOVE) ×2
GLOVE SURG LX STRL 7.5 STRW (GLOVE) ×1 IMPLANT
GLOVE SURG TRIUMPH 8.0 PF LTX (GLOVE) ×3 IMPLANT
GOWN STRL REUS W/ TWL LRG LVL3 (GOWN DISPOSABLE) ×2 IMPLANT
GOWN STRL REUS W/TWL LRG LVL3 (GOWN DISPOSABLE) ×4
LENS IOL TECNIS ITEC 21.5 (Intraocular Lens) ×3 IMPLANT
MARKER SKIN DUAL TIP RULER LAB (MISCELLANEOUS) ×3 IMPLANT
NDL RETROBULBAR .5 NSTRL (NEEDLE) IMPLANT
NEEDLE FILTER BLUNT 18X 1/2SAF (NEEDLE) ×2
NEEDLE FILTER BLUNT 18X1 1/2 (NEEDLE) ×1 IMPLANT
PACK CATARACT BRASINGTON (MISCELLANEOUS) ×3 IMPLANT
PACK EYE AFTER SURG (MISCELLANEOUS) ×3 IMPLANT
PACK OPTHALMIC (MISCELLANEOUS) ×3 IMPLANT
RING MALYGIN 7.0 (MISCELLANEOUS) IMPLANT
SUT ETHILON 10-0 CS-B-6CS-B-6 (SUTURE)
SUT VICRYL  9 0 (SUTURE)
SUT VICRYL 9 0 (SUTURE) IMPLANT
SUTURE EHLN 10-0 CS-B-6CS-B-6 (SUTURE) IMPLANT
SYR 3ML LL SCALE MARK (SYRINGE) ×3 IMPLANT
SYR 5ML LL (SYRINGE) ×3 IMPLANT
SYR TB 1ML LUER SLIP (SYRINGE) ×3 IMPLANT
WATER STERILE IRR 250ML POUR (IV SOLUTION) ×3 IMPLANT
WIPE NON LINTING 3.25X3.25 (MISCELLANEOUS) ×3 IMPLANT

## 2016-07-18 NOTE — Transfer of Care (Signed)
Immediate Anesthesia Transfer of Care Note  Patient: Kellie Harris  Procedure(s) Performed: Procedure(s): CATARACT EXTRACTION PHACO AND INTRAOCULAR LENS PLACEMENT (IOC) (Left)  Patient Location: PACU  Anesthesia Type: MAC  Level of Consciousness: awake, alert  and patient cooperative  Airway and Oxygen Therapy: Patient Spontanous Breathing and Patient connected to supplemental oxygen  Post-op Assessment: Post-op Vital signs reviewed, Patient's Cardiovascular Status Stable, Respiratory Function Stable, Patent Airway and No signs of Nausea or vomiting  Post-op Vital Signs: Reviewed and stable  Complications: No apparent anesthesia complications

## 2016-07-18 NOTE — Anesthesia Procedure Notes (Signed)
Procedure Name: MAC Performed by: Aavya Shafer Pre-anesthesia Checklist: Patient identified, Emergency Drugs available, Suction available, Timeout performed and Patient being monitored Patient Re-evaluated:Patient Re-evaluated prior to inductionOxygen Delivery Method: Nasal cannula Placement Confirmation: positive ETCO2       

## 2016-07-18 NOTE — Anesthesia Postprocedure Evaluation (Signed)
Anesthesia Post Note  Patient: Kellie Harris  Procedure(s) Performed: Procedure(s) (LRB): CATARACT EXTRACTION PHACO AND INTRAOCULAR LENS PLACEMENT (IOC) (Left)  Patient location during evaluation: PACU Anesthesia Type: MAC Level of consciousness: awake and alert and oriented Pain management: satisfactory to patient Vital Signs Assessment: post-procedure vital signs reviewed and stable Respiratory status: spontaneous breathing, nonlabored ventilation and respiratory function stable Cardiovascular status: blood pressure returned to baseline and stable Postop Assessment: Adequate PO intake and No signs of nausea or vomiting Anesthetic complications: no    Raliegh Ip

## 2016-07-18 NOTE — Anesthesia Preprocedure Evaluation (Signed)
Anesthesia Evaluation  Patient identified by MRN, date of birth, ID band Patient awake    Airway Mallampati: II  TM Distance: >3 FB Neck ROM: Full    Dental   Pulmonary    Pulmonary exam normal        Cardiovascular hypertension, Normal cardiovascular exam     Neuro/Psych    GI/Hepatic   Endo/Other    Renal/GU      Musculoskeletal   Abdominal   Peds  Hematology   Anesthesia Other Findings   Reproductive/Obstetrics                            Anesthesia Physical Anesthesia Plan  ASA: II  Anesthesia Plan: MAC   Post-op Pain Management:    Induction: Intravenous  Airway Management Planned:   Additional Equipment:   Intra-op Plan:   Post-operative Plan:   Informed Consent: I have reviewed the patients History and Physical, chart, labs and discussed the procedure including the risks, benefits and alternatives for the proposed anesthesia with the patient or authorized representative who has indicated his/her understanding and acceptance.     Plan Discussed with: CRNA  Anesthesia Plan Comments:         Anesthesia Quick Evaluation  

## 2016-07-18 NOTE — H&P (Signed)
The History and Physical notes are on paper, have been signed, and are to be scanned. The patient remains stable and unchanged from the H&P.   Previous H&P reviewed, patient examined, and there are no changes.  Kellie Harris 07/18/2016 10:59 AM

## 2016-07-18 NOTE — Op Note (Signed)
OPERATIVE NOTE  Exodus Mccrea [id number] 07/18/2016   PREOPERATIVE DIAGNOSIS:  Nuclear sclerotic cataract left eye. H25.12   POSTOPERATIVE DIAGNOSIS:    Nuclear sclerotic cataract left eye.     PROCEDURE:  Phacoemusification with posterior chamber intraocular lens placement of the left eye   LENS:   Implant Name Type Inv. Item Serial No. Manufacturer Lot No. LRB No. Used  LENS IOL DIOP 21.5 - [id number] Intraocular Lens LENS IOL DIOP 21.5 [id number]   Left 1        ULTRASOUND TIME: 15  % of 0 minutes 55 seconds, CDE 8.0  SURGEON:  [name], MD   ANESTHESIA:  Topical with tetracaine drops and 2% Xylocaine jelly, augmented with 1% preservative-free intracameral lidocaine.    COMPLICATIONS:  None.   DESCRIPTION OF PROCEDURE:  The patient was identified in the holding room and transported to the operating room and placed in the supine position under the operating microscope.  The left eye was identified as the operative eye and it was prepped and draped in the usual sterile ophthalmic fashion.   A 1 millimeter clear-corneal paracentesis was made at the 1:30 position.  0.5 ml of preservative-free 1% lidocaine was injected into the anterior chamber.  The anterior chamber was filled with Viscoat viscoelastic.  A 2.4 millimeter keratome was used to make a near-clear corneal incision at the 10:30 position.  .  A curvilinear capsulorrhexis was made with a cystotome and capsulorrhexis forceps.  Balanced salt solution was used to hydrodissect and hydrodelineate the nucleus.   Phacoemulsification was then used in stop and chop fashion to remove the lens nucleus and epinucleus.  The remaining cortex was then removed using the irrigation and aspiration handpiece. Provisc was then placed into the capsular bag to distend it for lens placement.  A lens was then injected into the capsular bag.  The remaining viscoelastic was aspirated.   Wounds were hydrated with balanced salt solution.   The anterior chamber was inflated to a physiologic pressure with balanced salt solution.  No wound leaks were noted. Cefuroxime 0.1 ml of a 10mg /ml solution was injected into the anterior chamber for a dose of 1 mg of intracameral antibiotic at the completion of the case.   Timolol and Brimonidine drops were applied to the eye.  The patient was taken to the recovery room in stable condition without complications of anesthesia or surgery.  Lorrin Bodner 07/18/2016, 12:02 PM

## 2016-07-19 ENCOUNTER — Encounter: Payer: Self-pay | Admitting: Ophthalmology

## 2016-07-25 DIAGNOSIS — M21069 Valgus deformity, not elsewhere classified, unspecified knee: Secondary | ICD-10-CM | POA: Diagnosis not present

## 2016-07-25 DIAGNOSIS — M1711 Unilateral primary osteoarthritis, right knee: Secondary | ICD-10-CM | POA: Diagnosis not present

## 2016-07-25 DIAGNOSIS — M25511 Pain in right shoulder: Secondary | ICD-10-CM | POA: Diagnosis not present

## 2016-07-27 DIAGNOSIS — Z85828 Personal history of other malignant neoplasm of skin: Secondary | ICD-10-CM | POA: Diagnosis not present

## 2016-07-27 DIAGNOSIS — D692 Other nonthrombocytopenic purpura: Secondary | ICD-10-CM | POA: Diagnosis not present

## 2016-07-27 DIAGNOSIS — D2272 Melanocytic nevi of left lower limb, including hip: Secondary | ICD-10-CM | POA: Diagnosis not present

## 2016-07-27 DIAGNOSIS — D225 Melanocytic nevi of trunk: Secondary | ICD-10-CM | POA: Diagnosis not present

## 2016-08-02 DIAGNOSIS — M19012 Primary osteoarthritis, left shoulder: Secondary | ICD-10-CM | POA: Diagnosis not present

## 2016-08-09 DIAGNOSIS — M25661 Stiffness of right knee, not elsewhere classified: Secondary | ICD-10-CM | POA: Diagnosis not present

## 2016-08-09 DIAGNOSIS — R609 Edema, unspecified: Secondary | ICD-10-CM | POA: Diagnosis not present

## 2016-08-09 DIAGNOSIS — M25561 Pain in right knee: Secondary | ICD-10-CM | POA: Diagnosis not present

## 2016-08-23 DIAGNOSIS — R609 Edema, unspecified: Secondary | ICD-10-CM | POA: Diagnosis not present

## 2016-08-23 DIAGNOSIS — M25661 Stiffness of right knee, not elsewhere classified: Secondary | ICD-10-CM | POA: Diagnosis not present

## 2016-08-23 DIAGNOSIS — M25561 Pain in right knee: Secondary | ICD-10-CM | POA: Diagnosis not present

## 2016-08-27 DIAGNOSIS — Z961 Presence of intraocular lens: Secondary | ICD-10-CM | POA: Diagnosis not present

## 2016-09-05 DIAGNOSIS — M25561 Pain in right knee: Secondary | ICD-10-CM | POA: Diagnosis not present

## 2016-09-05 DIAGNOSIS — M25661 Stiffness of right knee, not elsewhere classified: Secondary | ICD-10-CM | POA: Diagnosis not present

## 2016-09-06 DIAGNOSIS — M25661 Stiffness of right knee, not elsewhere classified: Secondary | ICD-10-CM | POA: Diagnosis not present

## 2016-09-06 DIAGNOSIS — M25561 Pain in right knee: Secondary | ICD-10-CM | POA: Diagnosis not present

## 2016-09-06 DIAGNOSIS — R609 Edema, unspecified: Secondary | ICD-10-CM | POA: Diagnosis not present

## 2016-09-13 DIAGNOSIS — R609 Edema, unspecified: Secondary | ICD-10-CM | POA: Diagnosis not present

## 2016-09-13 DIAGNOSIS — M25661 Stiffness of right knee, not elsewhere classified: Secondary | ICD-10-CM | POA: Diagnosis not present

## 2016-09-13 DIAGNOSIS — M25561 Pain in right knee: Secondary | ICD-10-CM | POA: Diagnosis not present

## 2016-09-20 DIAGNOSIS — M25661 Stiffness of right knee, not elsewhere classified: Secondary | ICD-10-CM | POA: Diagnosis not present

## 2016-09-20 DIAGNOSIS — R609 Edema, unspecified: Secondary | ICD-10-CM | POA: Diagnosis not present

## 2016-09-20 DIAGNOSIS — M25561 Pain in right knee: Secondary | ICD-10-CM | POA: Diagnosis not present

## 2016-09-24 DIAGNOSIS — M25561 Pain in right knee: Secondary | ICD-10-CM | POA: Diagnosis not present

## 2016-09-24 DIAGNOSIS — R609 Edema, unspecified: Secondary | ICD-10-CM | POA: Diagnosis not present

## 2016-09-24 DIAGNOSIS — M25661 Stiffness of right knee, not elsewhere classified: Secondary | ICD-10-CM | POA: Diagnosis not present

## 2016-10-17 DIAGNOSIS — M25561 Pain in right knee: Secondary | ICD-10-CM | POA: Diagnosis not present

## 2016-10-17 DIAGNOSIS — M1711 Unilateral primary osteoarthritis, right knee: Secondary | ICD-10-CM | POA: Diagnosis not present

## 2016-10-22 DIAGNOSIS — R42 Dizziness and giddiness: Secondary | ICD-10-CM | POA: Diagnosis not present

## 2016-10-22 DIAGNOSIS — E7800 Pure hypercholesterolemia, unspecified: Secondary | ICD-10-CM | POA: Diagnosis not present

## 2016-10-22 DIAGNOSIS — I10 Essential (primary) hypertension: Secondary | ICD-10-CM | POA: Diagnosis not present

## 2016-11-07 DIAGNOSIS — R42 Dizziness and giddiness: Secondary | ICD-10-CM | POA: Diagnosis not present

## 2016-11-07 DIAGNOSIS — I6523 Occlusion and stenosis of bilateral carotid arteries: Secondary | ICD-10-CM | POA: Diagnosis not present

## 2016-11-27 DIAGNOSIS — N261 Atrophy of kidney (terminal): Secondary | ICD-10-CM | POA: Diagnosis not present

## 2016-11-27 DIAGNOSIS — N1330 Unspecified hydronephrosis: Secondary | ICD-10-CM | POA: Diagnosis not present

## 2016-11-27 DIAGNOSIS — N131 Hydronephrosis with ureteral stricture, not elsewhere classified: Secondary | ICD-10-CM | POA: Diagnosis not present

## 2016-11-27 DIAGNOSIS — N281 Cyst of kidney, acquired: Secondary | ICD-10-CM | POA: Diagnosis not present

## 2016-11-27 DIAGNOSIS — N135 Crossing vessel and stricture of ureter without hydronephrosis: Secondary | ICD-10-CM | POA: Diagnosis not present

## 2016-11-28 DIAGNOSIS — E785 Hyperlipidemia, unspecified: Secondary | ICD-10-CM | POA: Diagnosis not present

## 2016-11-28 DIAGNOSIS — I10 Essential (primary) hypertension: Secondary | ICD-10-CM | POA: Diagnosis not present

## 2016-11-28 DIAGNOSIS — R0609 Other forms of dyspnea: Secondary | ICD-10-CM | POA: Diagnosis not present

## 2016-11-28 DIAGNOSIS — R42 Dizziness and giddiness: Secondary | ICD-10-CM | POA: Diagnosis not present

## 2017-01-02 DIAGNOSIS — E7800 Pure hypercholesterolemia, unspecified: Secondary | ICD-10-CM | POA: Diagnosis not present

## 2017-01-02 DIAGNOSIS — I10 Essential (primary) hypertension: Secondary | ICD-10-CM | POA: Diagnosis not present

## 2017-01-09 DIAGNOSIS — I10 Essential (primary) hypertension: Secondary | ICD-10-CM | POA: Diagnosis not present

## 2017-01-09 DIAGNOSIS — Z0000 Encounter for general adult medical examination without abnormal findings: Secondary | ICD-10-CM | POA: Diagnosis not present

## 2017-01-09 DIAGNOSIS — E782 Mixed hyperlipidemia: Secondary | ICD-10-CM | POA: Diagnosis not present

## 2017-01-21 DIAGNOSIS — M19012 Primary osteoarthritis, left shoulder: Secondary | ICD-10-CM | POA: Diagnosis not present

## 2017-01-21 DIAGNOSIS — M7542 Impingement syndrome of left shoulder: Secondary | ICD-10-CM | POA: Diagnosis not present

## 2017-02-11 ENCOUNTER — Emergency Department: Payer: PPO

## 2017-02-11 ENCOUNTER — Inpatient Hospital Stay
Admission: EM | Admit: 2017-02-11 | Discharge: 2017-02-14 | DRG: 482 | Disposition: A | Discharge status: Skilled Nursing Facility | Payer: PPO | Attending: Internal Medicine | Admitting: Internal Medicine

## 2017-02-11 DIAGNOSIS — Z79899 Other long term (current) drug therapy: Secondary | ICD-10-CM | POA: Diagnosis not present

## 2017-02-11 DIAGNOSIS — G8918 Other acute postprocedural pain: Secondary | ICD-10-CM | POA: Diagnosis not present

## 2017-02-11 DIAGNOSIS — Z419 Encounter for procedure for purposes other than remedying health state, unspecified: Secondary | ICD-10-CM

## 2017-02-11 DIAGNOSIS — I10 Essential (primary) hypertension: Secondary | ICD-10-CM | POA: Diagnosis present

## 2017-02-11 DIAGNOSIS — E569 Vitamin deficiency, unspecified: Secondary | ICD-10-CM | POA: Diagnosis not present

## 2017-02-11 DIAGNOSIS — M25552 Pain in left hip: Secondary | ICD-10-CM | POA: Diagnosis not present

## 2017-02-11 DIAGNOSIS — Z85828 Personal history of other malignant neoplasm of skin: Secondary | ICD-10-CM

## 2017-02-11 DIAGNOSIS — W010XXA Fall on same level from slipping, tripping and stumbling without subsequent striking against object, initial encounter: Secondary | ICD-10-CM | POA: Diagnosis present

## 2017-02-11 DIAGNOSIS — S72002A Fracture of unspecified part of neck of left femur, initial encounter for closed fracture: Secondary | ICD-10-CM | POA: Diagnosis not present

## 2017-02-11 DIAGNOSIS — R4182 Altered mental status, unspecified: Secondary | ICD-10-CM | POA: Diagnosis not present

## 2017-02-11 DIAGNOSIS — M6281 Muscle weakness (generalized): Secondary | ICD-10-CM | POA: Diagnosis not present

## 2017-02-11 DIAGNOSIS — Z791 Long term (current) use of non-steroidal anti-inflammatories (NSAID): Secondary | ICD-10-CM

## 2017-02-11 DIAGNOSIS — E785 Hyperlipidemia, unspecified: Secondary | ICD-10-CM | POA: Diagnosis present

## 2017-02-11 DIAGNOSIS — K5900 Constipation, unspecified: Secondary | ICD-10-CM | POA: Diagnosis not present

## 2017-02-11 DIAGNOSIS — S72142A Displaced intertrochanteric fracture of left femur, initial encounter for closed fracture: Principal | ICD-10-CM | POA: Diagnosis present

## 2017-02-11 DIAGNOSIS — S72143D Displaced intertrochanteric fracture of unspecified femur, subsequent encounter for closed fracture with routine healing: Secondary | ICD-10-CM | POA: Diagnosis not present

## 2017-02-11 DIAGNOSIS — Z7401 Bed confinement status: Secondary | ICD-10-CM | POA: Diagnosis not present

## 2017-02-11 DIAGNOSIS — Z7982 Long term (current) use of aspirin: Secondary | ICD-10-CM

## 2017-02-11 DIAGNOSIS — R4782 Fluency disorder in conditions classified elsewhere: Secondary | ICD-10-CM | POA: Diagnosis present

## 2017-02-11 DIAGNOSIS — S73005A Unspecified dislocation of left hip, initial encounter: Secondary | ICD-10-CM | POA: Diagnosis not present

## 2017-02-11 DIAGNOSIS — W19XXXA Unspecified fall, initial encounter: Secondary | ICD-10-CM | POA: Diagnosis not present

## 2017-02-11 LAB — COMPREHENSIVE METABOLIC PANEL
ALT: 14 U/L (ref 14–54)
AST: 23 U/L (ref 15–41)
Albumin: 4.5 g/dL (ref 3.5–5.0)
Alkaline Phosphatase: 88 U/L (ref 38–126)
Anion gap: 9 (ref 5–15)
BILIRUBIN TOTAL: 0.6 mg/dL (ref 0.3–1.2)
BUN: 12 mg/dL (ref 6–20)
CALCIUM: 10.8 mg/dL — AB (ref 8.9–10.3)
CO2: 23 mmol/L (ref 22–32)
Chloride: 100 mmol/L — ABNORMAL LOW (ref 101–111)
Creatinine, Ser: 1 mg/dL (ref 0.44–1.00)
GFR calc Af Amer: 59 mL/min — ABNORMAL LOW (ref >60)
GFR, EST NON AFRICAN AMERICAN: 51 mL/min — AB (ref >60)
Glucose, Bld: 138 mg/dL — ABNORMAL HIGH (ref 65–99)
POTASSIUM: 3.6 mmol/L (ref 3.5–5.1)
Sodium: 132 mmol/L — ABNORMAL LOW (ref 135–145)
TOTAL PROTEIN: 7.4 g/dL (ref 6.5–8.1)

## 2017-02-11 LAB — CBC WITH DIFFERENTIAL/PLATELET
BASOS ABS: 0.1 10*3/uL (ref 0–0.1)
BASOS PCT: 1 %
EOS ABS: 0.4 10*3/uL (ref 0–0.7)
Eosinophils Relative: 3 %
HCT: 38.1 % (ref 35.0–47.0)
Hemoglobin: 12.9 g/dL (ref 12.0–16.0)
LYMPHS PCT: 7 %
Lymphs Abs: 1.1 10*3/uL (ref 1.0–3.6)
MCH: 28.7 pg (ref 26.0–34.0)
MCHC: 33.8 g/dL (ref 32.0–36.0)
MCV: 85 fL (ref 80.0–100.0)
Monocytes Absolute: 0.9 10*3/uL (ref 0.2–0.9)
Monocytes Relative: 5 %
Neutro Abs: 14.2 10*3/uL — ABNORMAL HIGH (ref 1.4–6.5)
Neutrophils Relative %: 84 %
PLATELETS: 339 10*3/uL (ref 150–440)
RBC: 4.48 MIL/uL (ref 3.80–5.20)
RDW: 14.6 % — ABNORMAL HIGH (ref 11.5–14.5)
WBC: 16.7 10*3/uL — AB (ref 3.6–11.0)

## 2017-02-11 MED ORDER — CEFAZOLIN SODIUM-DEXTROSE 2-4 GM/100ML-% IV SOLN
2.0000 g | INTRAVENOUS | Status: AC
  Administered 2017-02-12: 2 g via INTRAVENOUS
  Filled 2017-02-11: qty 100

## 2017-02-11 MED ORDER — MORPHINE SULFATE (PF) 4 MG/ML IV SOLN
INTRAVENOUS | Status: AC
  Administered 2017-02-11: 4 mg via INTRAVENOUS
  Filled 2017-02-11: qty 1

## 2017-02-11 MED ORDER — MORPHINE SULFATE (PF) 4 MG/ML IV SOLN
4.0000 mg | Freq: Once | INTRAVENOUS | Status: AC
  Administered 2017-02-11: 4 mg via INTRAVENOUS

## 2017-02-11 MED ORDER — CLINDAMYCIN PHOSPHATE 600 MG/50ML IV SOLN
600.0000 mg | INTRAVENOUS | Status: AC
  Administered 2017-02-12: 600 mg via INTRAVENOUS
  Filled 2017-02-11: qty 50

## 2017-02-11 NOTE — ED Provider Notes (Addendum)
[facility] Emergency Department Provider Note   ____________________________________________   First MD Initiated Contact with Patient 02/11/17 1932     (approximate)  I have reviewed the triage vital signs and the nursing notes.   HISTORY  Chief Complaint Fall and Hip Pain (left)    HPI Kellie Harris is a 83 y.o. female who was walking and trying to protect her knee lost her balance and fell landing on her left hip. She complains a lot of pain in the left hip and is unable to bear weight on it. Pain is not severe if she doesn't move but it she will move her x-ray we will give her some pain medicine. This just happened.   Past Medical History:  Diagnosis Date  . Arthritis    right knee, left shoulder/ OSTEO  . Cancer ([place])    skin   . Degenerative disc disease, lumbar    L4-L5  . Diverticulosis    ITIS  . History of hiatal hernia   . Hydronephrosis    right  . Hypercholesteremia   . Hypertension    CONTROLLED ON MEDS  . IBS (irritable bowel syndrome)   . Knee fracture, right 03/05/2015   DIFFICULTY WITH AMBULATION  . Osteopenia   . Ovarian cyst   . Pancreatic insufficiency   . Wears hearing aid    bilateral    There are no active problems to display for this patient.   Past Surgical History:  Procedure Laterality Date  . CATARACT EXTRACTION W/PHACO Right 11/02/2015   Procedure: CATARACT EXTRACTION PHACO AND INTRAOCULAR LENS PLACEMENT (IOC);  Surgeon: Leandrew Koyanagi, MD;  Location: [place];  Service: Ophthalmology;  Laterality: Right;  . CATARACT EXTRACTION W/PHACO Left 07/18/2016   Procedure: CATARACT EXTRACTION PHACO AND INTRAOCULAR LENS PLACEMENT (IOC);  Surgeon: Leandrew Koyanagi, MD;  Location: [place];  Service: Ophthalmology;  Laterality: Left;  . COLONOSCOPY    . kidney stent    . OOPHORECTOMY Bilateral 2015  . TONSILLECTOMY      Prior to Admission medications   Medication Sig Start Date End  Date Taking? Authorizing Provider  acetaminophen (TYLENOL) 500 MG tablet Take 1,000 mg by mouth every 6 (six) hours as needed.     [provider]  amLODipine (NORVASC) 5 MG tablet Take 5 mg by mouth daily. AM    [provider]  aspirin 81 MG tablet Take 81 mg by mouth daily.    [provider]  ibuprofen (ADVIL,MOTRIN) 400 MG tablet Take 400 mg by mouth every 6 (six) hours as needed.    [provider]  lisinopril (PRINIVIL,ZESTRIL) 20 MG tablet Take 20 mg by mouth daily. BREAKFAST AND BEDTIME    [provider]  meloxicam (MOBIC) 15 MG tablet Take 15 mg by mouth daily.    [provider]  Multiple Vitamin (MULTIVITAMIN) capsule Take 1 capsule by mouth daily.    [provider]  Omega-3 Fatty Acids (FISH OIL PO) Take by mouth daily.    [provider]  OVER THE COUNTER MEDICATION IBGard for irritable bowel    [provider]  pravastatin (PRAVACHOL) 20 MG tablet Take 20 mg by mouth daily. BEDTIME    [provider]  Probiotic Product (PROBIOTIC DAILY PO) Take by mouth. DINNER    [provider]    Allergies Patient has no known allergies.  Family History  Problem Relation Age of Onset  . Breast cancer Mother 57    Social  History Social History  Substance Use Topics  . Smoking status: Never Smoker  . Smokeless tobacco: Never Used  . Alcohol use No    Review of Systems  Constitutional: No fever/chills Eyes: No visual changes. ENT: No sore throat. Cardiovascular: Denies chest pain. Respiratory: Denies shortness of breath. Gastrointestinal: No abdominal pain.  No nausea, no vomiting.  No diarrhea.  No constipation. Genitourinary: Negative for dysuria. Musculoskeletal: Negative for back pain. Skin: Negative for rash. Neurological: Negative for headaches, focal weakness or numbness.  ____________________________________________   PHYSICAL EXAM:  VITAL SIGNS: ED Triage Vitals   Enc Vitals Group     BP 02/11/17 1927 (!) 187/102     Pulse Rate 02/11/17 1927 86     Resp 02/11/17 1927 20     Temp 02/11/17 1927 98.1 F (36.7 C)     Temp Source 02/11/17 1927 Oral     SpO2 02/11/17 1927 98 %     Weight 02/11/17 1931 105 lb (47.6 kg)     Height 02/11/17 1931 4\' 11"  (1.499 m)     Head Circumference --      Peak Flow --      Pain Score 02/11/17 1926 8     Pain Loc --      Pain Edu? --      Excl. in [place]? --     Constitutional: Alert and oriented. Well appearing and in no acute distress. Eyes: Conjunctivae are normal. PERRL. EOMI. Head: Atraumatic. Nose: No congestion/rhinnorhea. Mouth/Throat: Mucous membranes are moist.  Oropharynx non-erythematous. Neck: No stridor. Cardiovascular: Normal rate, regular rhythm. Grossly normal heart sounds.  Good peripheral circulation. Respiratory: Normal respiratory effort.  No retractions. Lungs CTAB. Gastrointestinal: Soft and nontender. No distention. No abdominal bruits. No CVA tenderness. Musculoskeletal: No lower extremity tenderness nor edema Except for left hip pain with palpation or movement.  No joint effusions. Neurologic:  Normal speech and language. No gross focal neurologic deficits are appreciated.  Skin:  Skin is warm, [place] and intact. No rash noted. Psychiatric: Mood and affect are normal. Speech and behavior are normal.  ____________________________________________   LABS (all labs ordered are listed, but only abnormal results are displayed)  Labs Reviewed  COMPREHENSIVE METABOLIC PANEL  CBC WITH DIFFERENTIAL/PLATELET   ____________________________________________  EKG  EKG read and interpreted by me shows normal sinus rhythm at a rate of 94 right bundle branch block and left axis no acute changes ____________________________________________  RADIOLOGY IMPRESSION: Minimally displaced intertrochanteric left hip fracture. Possible longitudinally-oriented extension to the subtrochanteric  femur.   Electronically Signed   By: Jeb Levering M.D.   On: 02/11/2017 20:30 ____________________________________________   PROCEDURES  Procedure(s) performed:   Procedures  Critical Care performed:   ____________________________________________   INITIAL IMPRESSION / ASSESSMENT AND PLAN / ED COURSE  Pertinent labs & imaging results that were available during my care of the patient were reviewed by me and considered in my medical decision making (see chart for details).       ____________________________________________   FINAL CLINICAL IMPRESSION(S) / ED DIAGNOSES  Final diagnoses:  Fracture dislocation of left hip joint, closed, initial encounter ([place])  Fluency disorder associated with underlying disease      NEW MEDICATIONS STARTED DURING THIS VISIT:  New Prescriptions   No medications on file     Note:  This document was prepared using Dragon voice recognition software and may include unintentional dictation errors.    Nena Polio, MD 02/11/17 2039    Nena Polio, MD  02/11/17 2105  

## 2017-02-11 NOTE — ED Triage Notes (Signed)
Pt presents to ED via EMS from [place] c/o left hip pain post fall. Pt states she has an injured knee and was trying to protect it but fell on rocks

## 2017-02-11 NOTE — ED Notes (Signed)
Pt responds to name, place, and situation . Pt is able to say she is in pain and if she feels better.

## 2017-02-11 NOTE — ED Notes (Signed)
RN spoke with pt ; unable to state name , if she was pain, communicated disorganized thoughts , stated "the lamp , ems, it hurts"  As a response. This has changed since arrival. Family/son stated this was not her norm, communicated new finding to Dr. Cinda Quest.

## 2017-02-12 ENCOUNTER — Encounter
Admission: EM | Disposition: A | Discharge status: Skilled Nursing Facility | Payer: Self-pay | Source: Home / Self Care | Attending: Internal Medicine

## 2017-02-12 ENCOUNTER — Inpatient Hospital Stay: Payer: PPO | Admitting: Anesthesiology

## 2017-02-12 ENCOUNTER — Inpatient Hospital Stay: Payer: PPO

## 2017-02-12 ENCOUNTER — Encounter: Payer: Self-pay | Admitting: Neurology

## 2017-02-12 DIAGNOSIS — Z79899 Other long term (current) drug therapy: Secondary | ICD-10-CM | POA: Diagnosis not present

## 2017-02-12 DIAGNOSIS — S72002A Fracture of unspecified part of neck of left femur, initial encounter for closed fracture: Secondary | ICD-10-CM | POA: Diagnosis present

## 2017-02-12 DIAGNOSIS — W010XXA Fall on same level from slipping, tripping and stumbling without subsequent striking against object, initial encounter: Secondary | ICD-10-CM | POA: Diagnosis present

## 2017-02-12 DIAGNOSIS — Z7982 Long term (current) use of aspirin: Secondary | ICD-10-CM | POA: Diagnosis not present

## 2017-02-12 DIAGNOSIS — S72142A Displaced intertrochanteric fracture of left femur, initial encounter for closed fracture: Secondary | ICD-10-CM | POA: Diagnosis present

## 2017-02-12 DIAGNOSIS — Z85828 Personal history of other malignant neoplasm of skin: Secondary | ICD-10-CM | POA: Diagnosis not present

## 2017-02-12 DIAGNOSIS — R4782 Fluency disorder in conditions classified elsewhere: Secondary | ICD-10-CM | POA: Diagnosis present

## 2017-02-12 DIAGNOSIS — I10 Essential (primary) hypertension: Secondary | ICD-10-CM | POA: Diagnosis present

## 2017-02-12 DIAGNOSIS — E785 Hyperlipidemia, unspecified: Secondary | ICD-10-CM | POA: Diagnosis present

## 2017-02-12 DIAGNOSIS — Z791 Long term (current) use of non-steroidal anti-inflammatories (NSAID): Secondary | ICD-10-CM | POA: Diagnosis not present

## 2017-02-12 HISTORY — PX: INTRAMEDULLARY (IM) NAIL INTERTROCHANTERIC: SHX5875

## 2017-02-12 LAB — CBC
HEMATOCRIT: 34.9 % — AB (ref 35.0–47.0)
HEMOGLOBIN: 11.6 g/dL — AB (ref 12.0–16.0)
MCH: 28.5 pg (ref 26.0–34.0)
MCHC: 33.2 g/dL (ref 32.0–36.0)
MCV: 85.8 fL (ref 80.0–100.0)
Platelets: 275 10*3/uL (ref 150–440)
RBC: 4.07 MIL/uL (ref 3.80–5.20)
RDW: 14.3 % (ref 11.5–14.5)
WBC: 20.7 10*3/uL — ABNORMAL HIGH (ref 3.6–11.0)

## 2017-02-12 LAB — SURGICAL PCR SCREEN
MRSA, PCR: NEGATIVE
STAPHYLOCOCCUS AUREUS: NEGATIVE

## 2017-02-12 LAB — BASIC METABOLIC PANEL
ANION GAP: 7 (ref 5–15)
BUN: 11 mg/dL (ref 6–20)
CO2: 25 mmol/L (ref 22–32)
Calcium: 9.8 mg/dL (ref 8.9–10.3)
Chloride: 101 mmol/L (ref 101–111)
Creatinine, Ser: 0.88 mg/dL (ref 0.44–1.00)
GFR calc Af Amer: >60 mL/min (ref >60)
GFR calc non Af Amer: 59 mL/min — ABNORMAL LOW (ref >60)
GLUCOSE: 153 mg/dL — AB (ref 65–99)
POTASSIUM: 3.7 mmol/L (ref 3.5–5.1)
Sodium: 133 mmol/L — ABNORMAL LOW (ref 135–145)

## 2017-02-12 LAB — GLUCOSE, CAPILLARY
GLUCOSE-CAPILLARY: 105 mg/dL — AB (ref 65–99)
Glucose-Capillary: 146 mg/dL — ABNORMAL HIGH (ref 65–99)

## 2017-02-12 LAB — PROTIME-INR
INR: 1.09
Prothrombin Time: 14.1 seconds (ref 11.4–15.2)

## 2017-02-12 SURGERY — FIXATION, FRACTURE, INTERTROCHANTERIC, WITH INTRAMEDULLARY ROD
Anesthesia: Spinal | Site: Hip | Laterality: Left | Wound class: Clean

## 2017-02-12 MED ORDER — FLEET ENEMA 7-19 GM/118ML RE ENEM: 1.0000 | ENEMA | Freq: Once | RECTAL | Status: DC | PRN

## 2017-02-12 MED ORDER — ONDANSETRON HCL 4 MG/2ML IJ SOLN: 4.0000 mg | Freq: Once | INTRAMUSCULAR | Status: DC | PRN

## 2017-02-12 MED ORDER — METOCLOPRAMIDE HCL 5 MG/ML IJ SOLN: 5.0000 mg | Freq: Three times a day (TID) | INTRAMUSCULAR | Status: DC | PRN

## 2017-02-12 MED ORDER — ONDANSETRON HCL 4 MG/2ML IJ SOLN: 4.0000 mg | Freq: Four times a day (QID) | INTRAMUSCULAR | Status: DC | PRN

## 2017-02-12 MED ORDER — ACETAMINOPHEN 500 MG PO TABS: 1000.0000 mg | ORAL_TABLET | Freq: Four times a day (QID) | ORAL | Status: DC | PRN

## 2017-02-12 MED ORDER — SENNA 8.6 MG PO TABS
1.0000 | ORAL_TABLET | Freq: Two times a day (BID) | ORAL | Status: DC
  Administered 2017-02-12 – 2017-02-14 (×4): 8.6 mg via ORAL
  Filled 2017-02-12 (×4): qty 1

## 2017-02-12 MED ORDER — ONDANSETRON HCL 4 MG PO TABS: 4.0000 mg | ORAL_TABLET | Freq: Four times a day (QID) | ORAL | Status: DC | PRN

## 2017-02-12 MED ORDER — ASPIRIN EC 81 MG PO TBEC: 81.0000 mg | DELAYED_RELEASE_TABLET | Freq: Every day | ORAL | Status: DC

## 2017-02-12 MED ORDER — ENOXAPARIN SODIUM 30 MG/0.3ML ~~LOC~~ SOLN
30.0000 mg | SUBCUTANEOUS | Status: DC
  Administered 2017-02-13 – 2017-02-14 (×2): 30 mg via SUBCUTANEOUS
  Filled 2017-02-12 (×2): qty 0.3

## 2017-02-12 MED ORDER — OXYCODONE HCL 5 MG PO TABS: 5.0000 mg | ORAL_TABLET | ORAL | Status: DC | PRN

## 2017-02-12 MED ORDER — FERROUS SULFATE 325 (65 FE) MG PO TABS
325.0000 mg | ORAL_TABLET | Freq: Every day | ORAL | Status: DC
  Administered 2017-02-13 – 2017-02-14 (×2): 325 mg via ORAL
  Filled 2017-02-12 (×2): qty 1

## 2017-02-12 MED ORDER — BISACODYL 10 MG RE SUPP
10.0000 mg | Freq: Every day | RECTAL | Status: DC | PRN
  Administered 2017-02-14: 10 mg via RECTAL
  Filled 2017-02-12: qty 1

## 2017-02-12 MED ORDER — PHENOL 1.4 % MT LIQD
1.0000 | OROMUCOSAL | Status: DC | PRN
  Filled 2017-02-12: qty 177

## 2017-02-12 MED ORDER — SODIUM CHLORIDE 0.45 % IV SOLN
INTRAVENOUS | Status: DC
  Administered 2017-02-12: 20:00:00 via INTRAVENOUS

## 2017-02-12 MED ORDER — METHOCARBAMOL 1000 MG/10ML IJ SOLN
500.0000 mg | Freq: Once | INTRAMUSCULAR | Status: AC
  Administered 2017-02-12: 500 mg via INTRAVENOUS
  Filled 2017-02-12 (×2): qty 5

## 2017-02-12 MED ORDER — HYDROCODONE-ACETAMINOPHEN 5-325 MG PO TABS
1.0000 | ORAL_TABLET | Freq: Four times a day (QID) | ORAL | Status: DC | PRN
  Administered 2017-02-12 – 2017-02-14 (×3): 1 via ORAL
  Filled 2017-02-12: qty 1
  Filled 2017-02-12: qty 2
  Filled 2017-02-12: qty 1

## 2017-02-12 MED ORDER — KETAMINE HCL 10 MG/ML IJ SOLN
INTRAMUSCULAR | Status: DC | PRN
  Administered 2017-02-12: 25 mg via INTRAVENOUS

## 2017-02-12 MED ORDER — CLINDAMYCIN PHOSPHATE 600 MG/50ML IV SOLN
600.0000 mg | Freq: Three times a day (TID) | INTRAVENOUS | Status: AC
  Administered 2017-02-12 – 2017-02-13 (×3): 600 mg via INTRAVENOUS
  Filled 2017-02-12 (×4): qty 50

## 2017-02-12 MED ORDER — OXYCODONE HCL 5 MG PO TABS
5.0000 mg | ORAL_TABLET | Freq: Once | ORAL | Status: AC
  Administered 2017-02-12: 5 mg via ORAL
  Filled 2017-02-12: qty 1

## 2017-02-12 MED ORDER — LISINOPRIL 20 MG PO TABS: 20.0000 mg | ORAL_TABLET | Freq: Two times a day (BID) | ORAL | Status: DC

## 2017-02-12 MED ORDER — MORPHINE SULFATE (PF) 4 MG/ML IV SOLN: 4.0000 mg | INTRAVENOUS | Status: DC | PRN

## 2017-02-12 MED ORDER — ALUM & MAG HYDROXIDE-SIMETH 200-200-20 MG/5ML PO SUSP: 30.0000 mL | ORAL | Status: DC | PRN

## 2017-02-12 MED ORDER — FENTANYL CITRATE (PF) 100 MCG/2ML IJ SOLN: 25.0000 ug | INTRAMUSCULAR | Status: DC | PRN

## 2017-02-12 MED ORDER — ZOLPIDEM TARTRATE 5 MG PO TABS: 5.0000 mg | ORAL_TABLET | Freq: Every evening | ORAL | Status: DC | PRN

## 2017-02-12 MED ORDER — PRAVASTATIN SODIUM 20 MG PO TABS: 20.0000 mg | ORAL_TABLET | Freq: Every day | ORAL | Status: DC

## 2017-02-12 MED ORDER — BUPIVACAINE-EPINEPHRINE (PF) 0.25% -1:200000 IJ SOLN
INTRAMUSCULAR | Status: DC | PRN
  Administered 2017-02-12: 30 mL via PERINEURAL

## 2017-02-12 MED ORDER — METOCLOPRAMIDE HCL 10 MG PO TABS: 5.0000 mg | ORAL_TABLET | Freq: Three times a day (TID) | ORAL | Status: DC | PRN

## 2017-02-12 MED ORDER — MORPHINE SULFATE (PF) 2 MG/ML IV SOLN: 1.0000 mg | INTRAVENOUS | Status: DC | PRN

## 2017-02-12 MED ORDER — MEPERIDINE HCL 50 MG/ML IJ SOLN
6.2500 mg | INTRAMUSCULAR | Status: DC | PRN
  Administered 2017-02-12: 6.25 mg via INTRAVENOUS

## 2017-02-12 MED ORDER — LACTATED RINGERS IV SOLN
INTRAVENOUS | Status: DC | PRN
  Administered 2017-02-12: 17:00:00 via INTRAVENOUS

## 2017-02-12 MED ORDER — MENTHOL 3 MG MT LOZG
1.0000 | LOZENGE | OROMUCOSAL | Status: DC | PRN
  Filled 2017-02-12: qty 9

## 2017-02-12 MED ORDER — PROPOFOL 500 MG/50ML IV EMUL
INTRAVENOUS | Status: DC | PRN
  Administered 2017-02-12: 25 ug/kg/min via INTRAVENOUS

## 2017-02-12 MED ORDER — ACETAMINOPHEN 325 MG PO TABS: 650.0000 mg | ORAL_TABLET | Freq: Four times a day (QID) | ORAL | Status: DC | PRN

## 2017-02-12 MED ORDER — BUPIVACAINE HCL (PF) 0.5 % IJ SOLN
INTRAMUSCULAR | Status: DC | PRN
  Administered 2017-02-12: 3 mL via INTRATHECAL

## 2017-02-12 MED ORDER — MIDAZOLAM HCL 5 MG/5ML IJ SOLN
INTRAMUSCULAR | Status: DC | PRN
  Administered 2017-02-12 (×2): 1 mg via INTRAVENOUS

## 2017-02-12 MED ORDER — NEOMYCIN-POLYMYXIN B GU 40-200000 IR SOLN
Status: DC | PRN
  Administered 2017-02-12: 2 mL

## 2017-02-12 MED ORDER — AMLODIPINE BESYLATE 5 MG PO TABS: 5.0000 mg | ORAL_TABLET | Freq: Every day | ORAL | Status: DC

## 2017-02-12 MED ORDER — SODIUM CHLORIDE 0.9 % IV SOLN
INTRAVENOUS | Status: DC
  Administered 2017-02-12: 02:00:00 via INTRAVENOUS

## 2017-02-12 MED ORDER — MEPERIDINE HCL 50 MG/ML IJ SOLN
INTRAMUSCULAR | Status: AC
  Administered 2017-02-12: 6.25 mg via INTRAVENOUS
  Filled 2017-02-12: qty 1

## 2017-02-12 MED ORDER — ACETAMINOPHEN 650 MG RE SUPP: 650.0000 mg | Freq: Four times a day (QID) | RECTAL | Status: DC | PRN

## 2017-02-12 MED ORDER — TRANEXAMIC ACID 1000 MG/10ML IV SOLN
INTRAVENOUS | Status: AC | PRN
  Administered 2017-02-12: 1000 mg via INTRAVENOUS

## 2017-02-12 MED ORDER — CEFAZOLIN SODIUM-DEXTROSE 2-4 GM/100ML-% IV SOLN
2.0000 g | Freq: Three times a day (TID) | INTRAVENOUS | Status: AC
  Administered 2017-02-12 – 2017-02-13 (×3): 2 g via INTRAVENOUS
  Filled 2017-02-12 (×3): qty 100

## 2017-02-12 MED ORDER — PROPOFOL 10 MG/ML IV BOLUS
INTRAVENOUS | Status: DC | PRN
  Administered 2017-02-12: 20 mg via INTRAVENOUS

## 2017-02-12 MED ORDER — SODIUM CHLORIDE 0.9 % IV SOLN
INTRAVENOUS | Status: DC | PRN
  Administered 2017-02-12: 25 ug/min via INTRAVENOUS

## 2017-02-12 MED ORDER — MAGNESIUM HYDROXIDE 400 MG/5ML PO SUSP
30.0000 mL | Freq: Every day | ORAL | Status: DC | PRN
  Filled 2017-02-12: qty 30

## 2017-02-12 SURGICAL SUPPLY — 36 items
BLADE INTRAMED NAIL 11X85 (Orthopedic Implant) ×2 IMPLANT
BLADE INTRAMED NAIL 11X85MML (Orthopedic Implant) ×1 IMPLANT
BNDG COHESIVE 4X5 TAN STRL (GAUZE/BANDAGES/DRESSINGS) ×3 IMPLANT
CANISTER SUCT 1200ML W/VALVE (MISCELLANEOUS) ×3 IMPLANT
CHLORAPREP W/TINT 26ML (MISCELLANEOUS) ×3 IMPLANT
DRAPE C-ARMOR (DRAPES) IMPLANT
DRAPE INCISE 23X17 IOBAN STRL (DRAPES) ×2
DRAPE INCISE IOBAN 23X17 STRL (DRAPES) ×1 IMPLANT
DRSG AQUACEL AG ADV 3.5X10 (GAUZE/BANDAGES/DRESSINGS) ×3 IMPLANT
DRSG AQUACEL AG ADV 3.5X14 (GAUZE/BANDAGES/DRESSINGS) IMPLANT
ELECT REM PT RETURN 9FT ADLT (ELECTROSURGICAL) ×3
ELECTRODE REM PT RTRN 9FT ADLT (ELECTROSURGICAL) ×1 IMPLANT
GAUZE PETRO XEROFOAM 1X8 (MISCELLANEOUS) IMPLANT
GAUZE SPONGE 4X4 12PLY STRL (GAUZE/BANDAGES/DRESSINGS) ×3 IMPLANT
GLOVE BIO SURGEON STRL SZ7.5 (GLOVE) ×3 IMPLANT
GLOVE INDICATOR 8.0 STRL GRN (GLOVE) ×3 IMPLANT
GLOVE SURG ORTHO 8.5 STRL (GLOVE) ×3 IMPLANT
GOWN STRL REUS W/ TWL LRG LVL3 (GOWN DISPOSABLE) ×1 IMPLANT
GOWN STRL REUS W/TWL LRG LVL3 (GOWN DISPOSABLE) ×2
GOWN STRL REUS W/TWL LRG LVL4 (GOWN DISPOSABLE) ×3 IMPLANT
KIT RM TURNOVER STRD PROC AR (KITS) ×3 IMPLANT
MAT BLUE FLOOR 46X72 FLO (MISCELLANEOUS) ×3 IMPLANT
NAIL TROCH FX 11/130D 170-S (Nail) ×3 IMPLANT
NEEDLE SPNL 18GX3.5 QUINCKE PK (NEEDLE) ×6 IMPLANT
NS IRRIG 500ML POUR BTL (IV SOLUTION) ×3 IMPLANT
PACK HIP COMPR (MISCELLANEOUS) ×3 IMPLANT
REAMER ROD DEEP FLUTE 2.5X950 (INSTRUMENTS) ×3 IMPLANT
SCREW LOCK TI 5.0X36 F/IM NAIL (Screw) ×3 IMPLANT
STAPLER SKIN PROX 35W (STAPLE) ×3 IMPLANT
SUCTION FRAZIER HANDLE 10FR (MISCELLANEOUS) ×2
SUCTION TUBE FRAZIER 10FR DISP (MISCELLANEOUS) ×1 IMPLANT
SUT VIC AB 0 CT1 36 (SUTURE) ×3 IMPLANT
SUT VIC AB 2-0 CT1 27 (SUTURE) ×2
SUT VIC AB 2-0 CT1 TAPERPNT 27 (SUTURE) ×1 IMPLANT
SYR 10ML LL (SYRINGE) ×3 IMPLANT
SYR 30ML LL (SYRINGE) ×3 IMPLANT

## 2017-02-12 NOTE — H&P (Signed)
[name] NAME: Kellie Harris    MR#:  [id number]  DATE OF BIRTH:  09/09/1930  DATE OF ADMISSION:  02/11/2017  PRIMARY CARE PHYSICIAN: Dion Body, MD   REQUESTING/REFERRING PHYSICIAN: Cinda Quest, MD  CHIEF COMPLAINT:   Chief Complaint  Patient presents with  . Fall  . Hip Pain    left    HISTORY OF PRESENT ILLNESS:  Kellie Harris  is a 83 y.o. female who presents with Mechanical fall and subsequent left hip fracture. Orthopedic surgery contacted from the ED and is aware, likely plan for surgical repair. Hospitalists were called for admission.  PAST MEDICAL HISTORY:   Past Medical History:  Diagnosis Date  . Arthritis    right knee, left shoulder/ OSTEO  . Cancer ([place])    skin   . Degenerative disc disease, lumbar    L4-L5  . Diverticulosis    ITIS  . History of hiatal hernia   . Hydronephrosis    right  . Hypercholesteremia   . Hypertension    CONTROLLED ON MEDS  . IBS (irritable bowel syndrome)   . Knee fracture, right 03/05/2015   DIFFICULTY WITH AMBULATION  . Osteopenia   . Ovarian cyst   . Pancreatic insufficiency   . Wears hearing aid    bilateral    PAST SURGICAL HISTORY:   Past Surgical History:  Procedure Laterality Date  . CATARACT EXTRACTION W/PHACO Right 11/02/2015   Procedure: CATARACT EXTRACTION PHACO AND INTRAOCULAR LENS PLACEMENT (IOC);  Surgeon: Leandrew Koyanagi, MD;  Location: [facility];  Service: Ophthalmology;  Laterality: Right;  . CATARACT EXTRACTION W/PHACO Left 07/18/2016   Procedure: CATARACT EXTRACTION PHACO AND INTRAOCULAR LENS PLACEMENT (IOC);  Surgeon: Leandrew Koyanagi, MD;  Location: [facility];  Service: Ophthalmology;  Laterality: Left;  . COLONOSCOPY    . kidney stent    . OOPHORECTOMY Bilateral 2015  . TONSILLECTOMY      SOCIAL HISTORY:   Social History  Substance Use Topics  . Smoking status: Never Smoker  . Smokeless tobacco: Never  Used  . Alcohol use No    FAMILY HISTORY:   Family History  Problem Relation Age of Onset  . Breast cancer Mother 63    DRUG ALLERGIES:  No Known Allergies  MEDICATIONS AT HOME:   Prior to Admission medications   Medication Sig Start Date End Date Taking? Authorizing Provider  amLODipine (NORVASC) 5 MG tablet Take 5 mg by mouth daily.    Yes [provider]  aspirin 81 MG tablet Take 81 mg by mouth daily.   Yes [provider]  lisinopril (PRINIVIL,ZESTRIL) 20 MG tablet Take 20 mg by mouth 2 (two) times daily.    Yes [provider]  Multiple Vitamin (MULTIVITAMIN WITH MINERALS) TABS tablet Take 1 tablet by mouth daily.   Yes [provider]  pravastatin (PRAVACHOL) 20 MG tablet Take 20 mg by mouth at bedtime.    Yes [provider]    REVIEW OF SYSTEMS:  Review of Systems  Constitutional: Negative for chills, fever, malaise/fatigue and weight loss.  HENT: Negative for ear pain, hearing loss and tinnitus.   Eyes: Negative for blurred vision, double vision, pain and redness.  Respiratory: Negative for cough, hemoptysis and shortness of breath.   Cardiovascular: Negative for chest pain, palpitations, orthopnea and leg swelling.  Gastrointestinal: Negative for abdominal pain, constipation, diarrhea, nausea and vomiting.  Genitourinary: Negative for dysuria, frequency and hematuria.  Musculoskeletal: Positive  for joint pain (Left hip). Negative for back pain and neck pain.  Skin:       No acne, rash, or lesions  Neurological: Negative for dizziness, tremors, focal weakness and weakness.  Endo/Heme/Allergies: Negative for polydipsia. Does not bruise/bleed easily.  Psychiatric/Behavioral: Negative for depression. The patient is not nervous/anxious and does not have insomnia.      VITAL SIGNS:   Vitals:   02/11/17 2300 02/11/17 2330 02/12/17 0000 02/12/17 0107  BP: (!) 169/96 (!) 167/106 (!) 179/98 (!) 186/99  Pulse: 99 (!) 104 93  99  Resp: 15 19 18 18   Temp:      TempSrc:      SpO2: 94% 94% 97% 95%  Weight:      Height:       Wt Readings from Last 3 Encounters:  02/11/17 47.6 kg (105 lb)  07/18/16 47.6 kg (105 lb)  11/02/15 49 kg (108 lb)    PHYSICAL EXAMINATION:  Physical Exam  Vitals reviewed. Constitutional: She is oriented to person, place, and time. She appears well-developed and well-nourished. No distress.  HENT:  Head: Normocephalic and atraumatic.  Mouth/Throat: Oropharynx is clear and moist.  Eyes: Conjunctivae and EOM are normal. Pupils are equal, round, and reactive to light. No scleral icterus.  Neck: Normal range of motion. Neck supple. No JVD present. No thyromegaly present.  Cardiovascular: Normal rate, regular rhythm and intact distal pulses.  Exam reveals no gallop and no friction rub.   No murmur heard. Respiratory: Effort normal and breath sounds normal. No respiratory distress. She has no wheezes. She has no rales.  GI: Soft. Bowel sounds are normal. She exhibits no distension. There is no tenderness.  Musculoskeletal: Normal range of motion. She exhibits tenderness (Left hip). She exhibits no edema.  No arthritis, no gout  Lymphadenopathy:    She has no cervical adenopathy.  Neurological: She is alert and oriented to person, place, and time. No cranial nerve deficit.  No dysarthria, no aphasia  Skin: Skin is warm and [place]. No rash noted. No erythema.  Psychiatric: She has a normal mood and affect. Her behavior is normal. Judgment and thought content normal.    LABORATORY PANEL:   CBC  Recent Labs Lab 02/11/17 2057  WBC 16.7*  HGB 12.9  HCT 38.1  PLT 339   ------------------------------------------------------------------------------------------------------------------  Chemistries   Recent Labs Lab 02/11/17 2057  NA 132*  K 3.6  CL 100*  CO2 23  GLUCOSE 138*  BUN 12  CREATININE 1.00  CALCIUM 10.8*  AST 23  ALT 14  ALKPHOS 88  BILITOT 0.6    ------------------------------------------------------------------------------------------------------------------  Cardiac Enzymes No results for input(s): TROPONINI in the last 168 hours. ------------------------------------------------------------------------------------------------------------------  RADIOLOGY:  Ct Head Wo Contrast  Result Date: 02/11/2017 CLINICAL DATA:  Altered mental status. Difficulty word finding after fall. EXAM: CT HEAD WITHOUT CONTRAST TECHNIQUE: Contiguous axial images were obtained from the base of the skull through the vertex without intravenous contrast. COMPARISON:  07/08/2014 CT FINDINGS: BRAIN: There is sulcal and ventricular prominence consistent with superficial and central atrophy. No intraparenchymal hemorrhage, mass effect nor midline shift. Periventricular and subcortical white matter hypodensities consistent with chronic small vessel ischemic disease are identified. Chronic right external capsule and thalamic lacunar infarcts are again noted. No acute large vascular territory infarcts. No abnormal extra-axial fluid collections. Basal cisterns are not effaced and midline. VASCULAR: Moderate calcific atherosclerosis of the carotid siphons and both vertebral arteries. SKULL: No skull fracture. No significant scalp soft tissue swelling. SINUSES/ORBITS:  The mastoid air-cells are clear. The included paranasal sinuses are well-aerated.The included ocular globes and orbital contents are non-suspicious. Bilateral lens replacement surgeries are noted. OTHER: None. IMPRESSION: Chronic cerebral atrophy with small vessel ischemic disease of periventricular and subcortical white matter. No acute intracranial appearing abnormality by CT. Electronically Signed   By: Ashley Royalty M.D.   On: 02/11/2017 21:01   Mr Brain Wo Contrast  Result Date: 02/11/2017 CLINICAL DATA:  LEFT hip pain. Altered mental status. History of hypertension. EXAM: MRI HEAD WITHOUT CONTRAST TECHNIQUE:  Multiplanar, multiecho pulse sequences of the brain and surrounding structures were obtained without intravenous contrast. COMPARISON:  CT HEAD February 11, 2017 at 2044 hours and MRI of the head July 09, 2014 FINDINGS: BRAIN: No reduced diffusion to suggest acute ischemia. No susceptibility artifact to suggest hemorrhage. The ventricles and sulci are normal for patient's age. Old RIGHT thalamus lacunar infarct. Patchy to confluent supratentorial white matter FLAIR T2 hyperintensities within cystic small infarct RIGHT frontal lobe white matter. Old RIGHT basal ganglia lacunar infarct. Multiple old small bilateral cerebellar infarcts. No midline shift, mass effect or masses. No abnormal extra-axial fluid collections. VASCULAR: Normal major intracranial vascular flow voids present at skull base. SKULL AND UPPER CERVICAL SPINE: No abnormal sellar expansion. No suspicious calvarial bone marrow signal. Craniocervical junction maintained. SINUSES/ORBITS: Chronic LEFT maxillary sinusitis with atresia. Mastoid air cells are well aerated. The included ocular globes and orbital contents are non-suspicious. Status post bilateral ocular lens implants. OTHER: None. IMPRESSION: 1. No acute intracranial process. 2. Old RIGHT basal ganglia, RIGHT thalamus and bilateral cerebella lacunar infarcts. 3. Moderate chronic small vessel ischemic disease. Electronically Signed   By: Elon Alas M.D.   On: 02/11/2017 22:45   Dg Hip Unilat W Or Wo Pelvis 2-3 Views Left  Result Date: 02/11/2017 CLINICAL DATA:  Left hip pain after fall today at [employer]. Fall on rocks. EXAM: DG HIP (WITH OR WITHOUT PELVIS) 2-3V LEFT COMPARISON:  None. FINDINGS: Intertrochanteric hip fracture is minimally displaced about the greater trochanter. Nondisplaced involvement the lesser trochanter. Possible minimal vertically-oriented involvement of the IS subtrochanteric femur seen only on a single view. Femoral head remains seated. Remainder the bony  pelvis is intact including the pubic rami. Pubic symphysis and sacroiliac joints are congruent. Bones are under mineralized. IMPRESSION: Minimally displaced intertrochanteric left hip fracture. Possible longitudinally-oriented extension to the subtrochanteric femur. Electronically Signed   By: Jeb Levering M.D.   On: 02/11/2017 20:30    EKG:   Orders placed or performed during the hospital encounter of 02/11/17  . ED EKG  . ED EKG  . EKG 12-Lead  . EKG 12-Lead    IMPRESSION AND PLAN:  Principal Problem:   Closed left hip fracture [facility]) - orthopedic surgery consult, likely plan for surgical repair later today. Cardiac risk stratification as below, patient has no risk factors   Active Problems:   HTN (hypertension) - continue home meds   HLD (hyperlipidemia) - continue home medications  All the records are reviewed and case discussed with ED provider. Management plans discussed with the patient and/or family.  DVT PROPHYLAXIS: Mechanical only  GI PROPHYLAXIS: None  ADMISSION STATUS: Inpatient  CODE STATUS: Full    Code Status Orders        Start     Ordered   02/12/17 0140  Full code  Continuous     02/12/17 0140    Code Status History    Date Active Date Inactive Code Status Order ID Comments  User Context   This patient has a current code status but no historical code status.      TOTAL TIME TAKING CARE OF THIS PATIENT: 45 minutes.   Kellie Harris [place] 02/12/2017, 2:04 AM  [facility]  Office  [phone]  CC: Primary care physician; Dion Body, MD  Note:  This document was prepared using Dragon voice recognition software and may include unintentional dictation errors.

## 2017-02-12 NOTE — Op Note (Signed)
DATE OF SURGERY:  02/12/2017  TIME: 5:58 PM  PATIENT NAME:  Kellie Harris  AGE: 83 y.o.  PRE-OPERATIVE DIAGNOSIS:  Left HIP FRACTURE INTERTROCHANTERIC   POST-OPERATIVE DIAGNOSIS:  SAME  PROCEDURE:  INTRAMEDULLARY (IM) NAIL INTERTROCHANTERIC LEFT HIP  SURGEON:  Chiyo Fay E  EBL:  25 cc  COMPLICATIONS:  NONE  OPERATIVE IMPLANTS: Synthes trochanteric femoral nail  130*/11MM  with interlocking helical blade  85 mm and distal locking screw  36 mm.  PREOPERATIVE INDICATIONS:  [name] is a 83 y.o. year old who fell and suffered a hip fracture. She was brought into the ER and then admitted and optimized and then elected for surgical intervention.    The risks benefits and alternatives were discussed with the patient including but not limited to the risks of nonoperative treatment, versus surgical intervention including infection, bleeding, nerve injury, malunion, nonunion, hardware prominence, hardware failure, need for hardware removal, blood clots, cardiopulmonary complications, morbidity, mortality, among others, and they were willing to proceed.    OPERATIVE PROCEDURE:  The patient was brought to the operating room and placed in the supine position.  SPINAL anesthesia was administered, with a foley. She was placed on the fracture table.  Closed reduction was performed under C-arm guidance. The length of the femur was also measured using fluoroscopy. Time out was then performed after sterile prep and drape. She received preoperative antibiotics.  Incision was made proximal to the greater trochanter. A guidewire was placed in the appropriate position. Confirmation was made on AP and lateral views. The above-named nail was opened. I opened the proximal femur with a reamer. I then placed the nail by hand easily down. I did not need to ream the femur.  Once the nail was completely seated, I placed a guidepin into the femoral head into the center center position through a second incision.  I  measured the length, and then reamed the lateral cortex and up into the head. I then placed the helical blade. Slight compression was applied. Anatomic fixation achieved. Bone quality was mediocre.  I then secured the proximal interlock.  The distal locking screw was then placed and after confirming the position of the fracture fragments and hardware I then removed the instruments, and took final C-arm pictures AP and lateral the entire length of the leg. Anatomic reconstruction was achieved, and the wounds were irrigated copiously and closed with Vicryl  followed by staples and Mccarver sterile dressing. Sponge and needle count were correct.   The patient was awakened and returned to PACU in stable and satisfactory condition. There no complications and the patient tolerated the procedure well.  She will be partial weightbearing as tolerated, and will be on Lovenox  For DVT prophylaxis.     Park Breed, M.D.

## 2017-02-12 NOTE — Progress Notes (Signed)
Subjective: Patient alert and awake. Complaining of some mild hip pain but has had pain medications.  Objective: Vital signs in last 24 hours: Temp:  [98.1 F (36.7 C)-99.5 F (37.5 C)] 98.9 F (37.2 C) (07/03 0708) Pulse Rate:  [86-104] 93 (07/03 0208) Resp:  [15-20] 18 (07/03 0208) BP: (146-187)/(75-106) 146/85 (07/03 0708) SpO2:  [94 %-98 %] 94 % (07/03 0708) Weight:  [47.6 kg (105 lb)-58 kg (127 lb 12.8 oz)] 58 kg (127 lb 12.8 oz) (07/03 0208) Weight change:  Last BM Date: 02/11/17  Intake/Output from previous day: 07/02 0701 - 07/03 0700 In: 163.3 [I.V.:108.3; IV Piggyback:55] Out: 1300 [Urine:1300] Intake/Output this shift: No intake/output data recorded.  General appearance: alert Resp: clear to auscultation bilaterally and normal percussion bilaterally Cardio: regular rate and rhythm, S1, S2 normal, no murmur, click, rub or gallop  Lab Results:  Recent Labs  02/11/17 2057 02/12/17 0457  WBC 16.7* 20.7*  HGB 12.9 11.6*  HCT 38.1 34.9*  PLT 339 275   BMET  Recent Labs  02/11/17 2057 02/12/17 0457  NA 132* 133*  K 3.6 3.7  CL 100* 101  CO2 23 25  GLUCOSE 138* 153*  BUN 12 11  CREATININE 1.00 0.88  CALCIUM 10.8* 9.8    Studies/Results: Ct Head Wo Contrast  Result Date: 02/11/2017 CLINICAL DATA:  Altered mental status. Difficulty word finding after fall. EXAM: CT HEAD WITHOUT CONTRAST TECHNIQUE: Contiguous axial images were obtained from the base of the skull through the vertex without intravenous contrast. COMPARISON:  07/08/2014 CT FINDINGS: BRAIN: There is sulcal and ventricular prominence consistent with superficial and central atrophy. No intraparenchymal hemorrhage, mass effect nor midline shift. Periventricular and subcortical white matter hypodensities consistent with chronic small vessel ischemic disease are identified. Chronic right external capsule and thalamic lacunar infarcts are again noted. No acute large vascular territory infarcts. No  abnormal extra-axial fluid collections. Basal cisterns are not effaced and midline. VASCULAR: Moderate calcific atherosclerosis of the carotid siphons and both vertebral arteries. SKULL: No skull fracture. No significant scalp soft tissue swelling. SINUSES/ORBITS: The mastoid air-cells are clear. The included paranasal sinuses are well-aerated.The included ocular globes and orbital contents are non-suspicious. Bilateral lens replacement surgeries are noted. OTHER: None. IMPRESSION: Chronic cerebral atrophy with small vessel ischemic disease of periventricular and subcortical white matter. No acute intracranial appearing abnormality by CT. Electronically Signed   By: Ashley Royalty M.D.   On: 02/11/2017 21:01   Mr Brain Wo Contrast  Result Date: 02/11/2017 CLINICAL DATA:  LEFT hip pain. Altered mental status. History of hypertension. EXAM: MRI HEAD WITHOUT CONTRAST TECHNIQUE: Multiplanar, multiecho pulse sequences of the brain and surrounding structures were obtained without intravenous contrast. COMPARISON:  CT HEAD February 11, 2017 at 2044 hours and MRI of the head July 09, 2014 FINDINGS: BRAIN: No reduced diffusion to suggest acute ischemia. No susceptibility artifact to suggest hemorrhage. The ventricles and sulci are normal for patient's age. Old RIGHT thalamus lacunar infarct. Patchy to confluent supratentorial white matter FLAIR T2 hyperintensities within cystic small infarct RIGHT frontal lobe white matter. Old RIGHT basal ganglia lacunar infarct. Multiple old small bilateral cerebellar infarcts. No midline shift, mass effect or masses. No abnormal extra-axial fluid collections. VASCULAR: Normal major intracranial vascular flow voids present at skull base. SKULL AND UPPER CERVICAL SPINE: No abnormal sellar expansion. No suspicious calvarial bone marrow signal. Craniocervical junction maintained. SINUSES/ORBITS: Chronic LEFT maxillary sinusitis with atresia. Mastoid air cells are well aerated. The included  ocular globes and orbital contents are  non-suspicious. Status post bilateral ocular lens implants. OTHER: None. IMPRESSION: 1. No acute intracranial process. 2. Old RIGHT basal ganglia, RIGHT thalamus and bilateral cerebella lacunar infarcts. 3. Moderate chronic small vessel ischemic disease. Electronically Signed   By: Elon Alas M.D.   On: 02/11/2017 22:45   Dg Hip Unilat W Or Wo Pelvis 2-3 Views Left  Result Date: 02/11/2017 CLINICAL DATA:  Left hip pain after fall today at [employer]. Fall on rocks. EXAM: DG HIP (WITH OR WITHOUT PELVIS) 2-3V LEFT COMPARISON:  None. FINDINGS: Intertrochanteric hip fracture is minimally displaced about the greater trochanter. Nondisplaced involvement the lesser trochanter. Possible minimal vertically-oriented involvement of the IS subtrochanteric femur seen only on a single view. Femoral head remains seated. Remainder the bony pelvis is intact including the pubic rami. Pubic symphysis and sacroiliac joints are congruent. Bones are under mineralized. IMPRESSION: Minimally displaced intertrochanteric left hip fracture. Possible longitudinally-oriented extension to the subtrochanteric femur. Electronically Signed   By: Jeb Levering M.D.   On: 02/11/2017 20:30    Medications: I have reviewed the patient's current medications.  Assessment/Plan: 1. Hip fracture. Patient has been optimized for surgery. She should be going for repair this afternoon. We'll follow up with orthopedics postop. 2. Hypertension. Continue her current medicines. Adjust as needed. 3. Hyperlipidemia. Continue her statin next line Total time spent 15 minutes  LOS: 0 days   Baxter Hire 02/12/2017, 11:35 AM

## 2017-02-12 NOTE — ED Notes (Signed)
Mallie Mussel RN called pharmacy to receive the ordered medications.

## 2017-02-12 NOTE — Clinical Social Work Note (Addendum)
Clinical Social Work Assessment  Patient Details  Name: Kellie Harris MRN: [id number] Date of Birth: [birth year]  Date of referral:  02/12/17               Reason for consult:  Facility Placement                Permission sought to share information with:  Chartered certified accountant granted to share information::  Yes, Verbal Permission Granted  Name::      [place]::   [place]   Relationship::     Contact Information:     Housing/Transportation Living arrangements for the past 2 months:  [place] of Information:  Patient, Adult Children Patient Interpreter Needed:  None Criminal Activity/Legal Involvement Pertinent to Current Situation/Hospitalization:  No - Comment as needed Significant Relationships:  Adult Children, Other Family Members Lives with:  Self Do you feel safe going back to the place where you live?  Yes Need for family participation in patient care:  Yes (Comment)  Care giving concerns:  Patient lives alone in [place].    Social Worker assessment / plan:  Holiday representative ([place]) received SNF consult. Per chart patient will have surgery today for a hip fracture. CSW met with patient and her son Kellie Harris prior to surgery today. Patient was alert and oriented X4 and was laying in the bed. CSW introduced self and explained role of CSW department. Patient reported that she lives alone in [place] and has 2 adult children, son Kellie Harris and daughter Kellie Harris. CSW explained that PT will evaluate patient after surgery and make a recommendation of home health or SNF. Patient reported that she has never been to SNF before but would considering going if that was what was best for her. Patient is agreeable to SNF search in [facility]. CSW also explained that Health Team will have to approve SNF. FL2 complete and faxed out. Health Team case manager is aware of above. CSW will continue to follow and assist as needed.   Employment  status:  Retired Nurse, adult PT Recommendations:  Not assessed at this time Information / Referral to community resources:  [place]  Patient/Family's Response to care:  Patient will consider SNF or home health pending surgery and PT.   Patient/Family's Understanding of and Emotional Response to Diagnosis, Current Treatment, and Prognosis:  Patient and her son were very pleasant and thanked CSW for visit.   Emotional Assessment Appearance:  Appears stated age Attitude/Demeanor/Rapport:    Affect (typically observed):  Accepting, Adaptable, Pleasant Orientation:  Oriented to Self, Oriented to Place, Oriented to  Time, Oriented to Situation Alcohol / Substance use:  Not Applicable Psych involvement (Current and /or in the community):  No (Comment)  Discharge Needs  Concerns to be addressed:  Discharge Planning Concerns Readmission within the last 30 days:  No Current discharge risk:  Dependent with Mobility Barriers to Discharge:  Continued Medical Work up   [employer], Veronia Beets, LCSW 02/12/2017, 12:14 PM

## 2017-02-12 NOTE — Transfer of Care (Signed)
Immediate Anesthesia Transfer of Care Note  Patient: Kellie Harris  Procedure(s) Performed: Procedure(s): INTRAMEDULLARY (IM) NAIL INTERTROCHANTRIC (Left)  Patient Location: PACU  Anesthesia Type:Spinal  Level of Consciousness: awake, alert  and oriented  Airway & Oxygen Therapy: Patient Spontanous Breathing and Patient connected to face mask oxygen  Post-op Assessment: Report given to RN and Post -op Vital signs reviewed and stable  Post vital signs: Reviewed  Last Vitals:  Vitals:   02/12/17 0708 02/12/17 1800  BP: (!) 146/85 112/70  Pulse:  82  Resp:  16  Temp: 37.2 C 36.7 C    Last Pain:  Vitals:   02/12/17 0708  TempSrc: Oral  PainSc:       Patients Stated Pain Goal: 0 (16/10/96 0454)  Complications: No apparent anesthesia complications

## 2017-02-12 NOTE — Clinical Social Work Placement (Signed)
   CLINICAL SOCIAL WORK PLACEMENT  NOTE  Date:  02/12/2017  Patient Details  Name: Kellie Harris MRN: [id number] Date of Birth: [birth year]  Clinical Social Work is seeking post-discharge placement for this patient at the [place] level of care (*CSW will initial, date and re-position this form in  chart as items are completed):  Yes   Patient/family provided with [facility] Work Department's list of facilities offering this level of care within the geographic area requested by the patient (or if unable, by the patient's family).  Yes   Patient/family informed of their freedom to choose among providers that offer the needed level of care, that participate in Medicare, Medicaid or managed care program needed by the patient, have an available bed and are willing to accept the patient.  Yes   Patient/family informed of [facility]'s ownership interest in [facility] and [facility], as well as of the fact that they are under no obligation to receive care at these facilities.  PASRR submitted to EDS on 02/12/17     PASRR number received on 02/12/17     Existing PASRR number confirmed on       FL2 transmitted to all facilities in geographic area requested by pt/family on 02/12/17     FL2 transmitted to all facilities within larger geographic area on       Patient informed that his/her managed care company has contracts with or will negotiate with certain facilities, including the following:            Patient/family informed of bed offers received.  Patient chooses bed at       Physician recommends and patient chooses bed at      Patient to be transferred to   on  .  Patient to be transferred to facility by       Patient family notified on   of transfer.  Name of family member notified:        PHYSICIAN       Additional Comment:    _______________________________________________ Tanisha Lutes, Veronia Beets, LCSW 02/12/2017, 12:13 PM

## 2017-02-12 NOTE — Anesthesia Procedure Notes (Signed)
Spinal  Patient location during procedure: OR Staffing Anesthesiologist: Martha Clan Resident/CRNA: Rolla Plate Performed: resident/CRNA  Preanesthetic Checklist Completed: patient identified, site marked, surgical consent, pre-op evaluation, timeout performed, IV checked, risks and benefits discussed and monitors and equipment checked Spinal Block Patient position: right lateral decubitus Prep: ChloraPrep and site prepped and draped Patient monitoring: heart rate, continuous pulse ox, blood pressure and cardiac monitor Approach: midline Location: L4-5 Injection technique: single-shot Needle Needle type: Introducer and Pencan  Needle gauge: 24 G Needle length: 9 cm Assessment Sensory level: T10 Additional Notes Negative paresthesia. Negative blood return. Positive free-flowing CSF. Expiration date of kit checked and confirmed. Patient tolerated procedure well, without complications.

## 2017-02-12 NOTE — H&P (Signed)
THE PATIENT WAS SEEN PRIOR TO SURGERY TODAY.  HISTORY, ALLERGIES, HOME MEDICATIONS AND OPERATIVE PROCEDURE WERE REVIEWED. RISKS AND BENEFITS OF SURGERY DISCUSSED WITH PATIENT AGAIN.  NO CHANGES FROM INITIAL HISTORY AND PHYSICAL NOTED.    

## 2017-02-12 NOTE — Anesthesia Post-op Follow-up Note (Cosign Needed)
Anesthesia QCDR form completed.        

## 2017-02-12 NOTE — Consult Note (Signed)
ORTHOPAEDIC CONSULTATION  REQUESTING PHYSICIAN: Baxter Hire, MD  Chief Complaint: Left hip pain  HPI: Kellie Harris is a 83 y.o. female who complains of  left hip pain after falling last night at the [employer].  She was seen in the emergency room where exam and x-rays revealed a minimally displaced intertrochanteric fracture of the left hip.  She was admitted for medical evaluation and surgery.  She has been cleared by the medical service for surgery.  I discussed surgery versus nonsurgical treatment with the patient and her son at length.  They are in agreement that surgical fixation of the fracture is indicated.  The risks and benefits and postop protocol were discussed with him.  Past Medical History:  Diagnosis Date  . Arthritis    right knee, left shoulder/ OSTEO  . Cancer ([place])    skin   . Degenerative disc disease, lumbar    L4-L5  . Diverticulosis    ITIS  . History of hiatal hernia   . Hydronephrosis    right  . Hypercholesteremia   . Hypertension    CONTROLLED ON MEDS  . IBS (irritable bowel syndrome)   . Knee fracture, right 03/05/2015   DIFFICULTY WITH AMBULATION  . Osteopenia   . Ovarian cyst   . Pancreatic insufficiency   . Wears hearing aid    bilateral   Past Surgical History:  Procedure Laterality Date  . CATARACT EXTRACTION W/PHACO Right 11/02/2015   Procedure: CATARACT EXTRACTION PHACO AND INTRAOCULAR LENS PLACEMENT (IOC);  Surgeon: Leandrew Koyanagi, MD;  Location: [facility];  Service: Ophthalmology;  Laterality: Right;  . CATARACT EXTRACTION W/PHACO Left 07/18/2016   Procedure: CATARACT EXTRACTION PHACO AND INTRAOCULAR LENS PLACEMENT (IOC);  Surgeon: Leandrew Koyanagi, MD;  Location: [facility];  Service: Ophthalmology;  Laterality: Left;  . COLONOSCOPY    . kidney stent    . OOPHORECTOMY Bilateral 2015  . TONSILLECTOMY     Social History   Social History  . Marital status: Widowed    Spouse name: N/A  .  Number of children: N/A  . Years of education: N/A   Social History Main Topics  . Smoking status: Never Smoker  . Smokeless tobacco: Never Used  . Alcohol use No  . Drug use: No  . Sexual activity: Not on file   Other Topics Concern  . Not on file   Social History Narrative  . No narrative on file   Family History  Problem Relation Age of Onset  . Breast cancer Mother 90   No Known Allergies Prior to Admission medications   Medication Sig Start Date End Date Taking? Authorizing Provider  amLODipine (NORVASC) 5 MG tablet Take 5 mg by mouth daily.    Yes [provider]  aspirin 81 MG tablet Take 81 mg by mouth daily.   Yes [provider]  lisinopril (PRINIVIL,ZESTRIL) 20 MG tablet Take 20 mg by mouth 2 (two) times daily.    Yes [provider]  Multiple Vitamin (MULTIVITAMIN WITH MINERALS) TABS tablet Take 1 tablet by mouth daily.   Yes [provider]  pravastatin (PRAVACHOL) 20 MG tablet Take 20 mg by mouth at bedtime.    Yes [provider]   Ct Head Wo Contrast  Result Date: 02/11/2017 CLINICAL DATA:  Altered mental status. Difficulty word finding after fall. EXAM: CT HEAD WITHOUT CONTRAST TECHNIQUE: Contiguous axial images were obtained from the base of the skull through the vertex without intravenous contrast. COMPARISON:  07/08/2014 CT FINDINGS: BRAIN: There is sulcal and ventricular prominence consistent with superficial and central atrophy. No intraparenchymal hemorrhage, mass effect nor midline shift. Periventricular and subcortical white matter hypodensities consistent with chronic small vessel ischemic disease are identified. Chronic right external capsule and thalamic lacunar infarcts are again noted. No acute large vascular territory infarcts. No abnormal extra-axial fluid collections. Basal cisterns are not effaced and midline. VASCULAR: Moderate calcific atherosclerosis of the carotid siphons and both vertebral arteries.  SKULL: No skull fracture. No significant scalp soft tissue swelling. SINUSES/ORBITS: The mastoid air-cells are clear. The included paranasal sinuses are well-aerated.The included ocular globes and orbital contents are non-suspicious. Bilateral lens replacement surgeries are noted. OTHER: None. IMPRESSION: Chronic cerebral atrophy with small vessel ischemic disease of periventricular and subcortical white matter. No acute intracranial appearing abnormality by CT. Electronically Signed   By: Ashley Royalty M.D.   On: 02/11/2017 21:01   Mr Brain Wo Contrast  Result Date: 02/11/2017 CLINICAL DATA:  LEFT hip pain. Altered mental status. History of hypertension. EXAM: MRI HEAD WITHOUT CONTRAST TECHNIQUE: Multiplanar, multiecho pulse sequences of the brain and surrounding structures were obtained without intravenous contrast. COMPARISON:  CT HEAD February 11, 2017 at 2044 hours and MRI of the head July 09, 2014 FINDINGS: BRAIN: No reduced diffusion to suggest acute ischemia. No susceptibility artifact to suggest hemorrhage. The ventricles and sulci are normal for patient's age. Old RIGHT thalamus lacunar infarct. Patchy to confluent supratentorial white matter FLAIR T2 hyperintensities within cystic small infarct RIGHT frontal lobe white matter. Old RIGHT basal ganglia lacunar infarct. Multiple old small bilateral cerebellar infarcts. No midline shift, mass effect or masses. No abnormal extra-axial fluid collections. VASCULAR: Normal major intracranial vascular flow voids present at skull base. SKULL AND UPPER CERVICAL SPINE: No abnormal sellar expansion. No suspicious calvarial bone marrow signal. Craniocervical junction maintained. SINUSES/ORBITS: Chronic LEFT maxillary sinusitis with atresia. Mastoid air cells are well aerated. The included ocular globes and orbital contents are non-suspicious. Status post bilateral ocular lens implants. OTHER: None. IMPRESSION: 1. No acute intracranial process. 2. Old RIGHT basal  ganglia, RIGHT thalamus and bilateral cerebella lacunar infarcts. 3. Moderate chronic small vessel ischemic disease. Electronically Signed   By: Elon Alas M.D.   On: 02/11/2017 22:45   Dg Hip Unilat W Or Wo Pelvis 2-3 Views Left  Result Date: 02/11/2017 CLINICAL DATA:  Left hip pain after fall today at [employer]. Fall on rocks. EXAM: DG HIP (WITH OR WITHOUT PELVIS) 2-3V LEFT COMPARISON:  None. FINDINGS: Intertrochanteric hip fracture is minimally displaced about the greater trochanter. Nondisplaced involvement the lesser trochanter. Possible minimal vertically-oriented involvement of the IS subtrochanteric femur seen only on a single view. Femoral head remains seated. Remainder the bony pelvis is intact including the pubic rami. Pubic symphysis and sacroiliac joints are congruent. Bones are under mineralized. IMPRESSION: Minimally displaced intertrochanteric left hip fracture. Possible longitudinally-oriented extension to the subtrochanteric femur. Electronically Signed   By: Jeb Levering M.D.   On: 02/11/2017 20:30    Positive ROS: All other systems have been reviewed and were otherwise negative with the exception of those mentioned in the HPI and as above.  Physical Exam: General: Alert, no acute distress Cardiovascular: No pedal edema Respiratory: No cyanosis, no use of accessory musculature GI: No organomegaly, abdomen is soft and non-tender Skin: No lesions in the area of chief complaint Neurologic: Sensation intact distally Psychiatric: Patient is competent for consent with normal mood and affect Lymphatic: No axillary or cervical lymphadenopathy  MUSCULOSKELETAL:  Patient is alert, fully oriented and cooperative.  Left leg is shortened and rotated.  There is pain with movement of the left hip.  There is no cervical bruising.  Skin is intact.  No other injuries are noted.  Assessment: Left intertrochanteric hip fracture  Plan: Left hip nailing    Park Breed,  MD [phone]   02/12/2017 1:06 PM

## 2017-02-12 NOTE — NC FL2 (Signed)
[place] LEVEL OF CARE SCREENING TOOL     IDENTIFICATION  Patient Name: Kellie Harris Birthdate: [birth year] Sex: female Admission Date (Current Location): 02/11/2017  [place] and [place] Number:  Engineering geologist and Address:  [facility], [address], [place], [place] [address]      Provider Number: [id number]  Attending Physician Name and Address:  Baxter Hire, MD  Relative Name and Phone Number:       Current Level of Care: Hospital Recommended Level of Care: [place] Prior Approval Number:    Date Approved/Denied:   PASRR Number:  [id number])  Discharge Plan: SNF    Current Diagnoses: Patient Active Problem List   Diagnosis Date Noted  . Closed left hip fracture ([place]) 02/12/2017  . HTN (hypertension) 02/12/2017  . HLD (hyperlipidemia) 02/12/2017    Orientation RESPIRATION BLADDER Height & Weight     Self, Time, Place, Situation  Normal Continent Weight: 127 lb 12.8 oz (58 kg) Height:  4\' 11"  (149.9 cm)  BEHAVIORAL SYMPTOMS/MOOD NEUROLOGICAL BOWEL NUTRITION STATUS   (none )  (none) Continent Diet (NPO for surgery to be advanced. )  AMBULATORY STATUS COMMUNICATION OF NEEDS Skin   Extensive Assist Verbally Surgical wounds                       Personal Care Assistance Level of Assistance  Bathing, Feeding, Dressing Bathing Assistance: Limited assistance Feeding assistance: Independent Dressing Assistance: Limited assistance     Functional Limitations Info  Sight, Hearing, Speech Sight Info: Adequate Hearing Info: Impaired Speech Info: Adequate    SPECIAL CARE FACTORS FREQUENCY  PT (By licensed PT), OT (By licensed OT)     PT Frequency:  (5) OT Frequency:  (5)            Contractures      Additional Factors Info  Code Status, Allergies Code Status Info:  (Full Code. ) Allergies Info:  (No Known Allergies. )           Current Medications (02/12/2017):  This is the  current hospital active medication list Current Facility-Administered Medications  Medication Dose Route Frequency Provider Last Rate Last Dose  . 0.9 %  sodium chloride infusion   Intravenous Continuous Lance Coon, MD 50 mL/hr at 02/12/17 0215    . acetaminophen (TYLENOL) tablet 650 mg  650 mg Oral Q6H PRN Lance Coon, MD       Or  . acetaminophen (TYLENOL) suppository 650 mg  650 mg Rectal Q6H PRN Lance Coon, MD      . amLODipine (NORVASC) tablet 5 mg  5 mg Oral Daily Lance Coon, MD      . aspirin EC tablet 81 mg  81 mg Oral Daily Lance Coon, MD      . ceFAZolin (ANCEF) IVPB 2g/100 mL premix  2 g Intravenous On Call to OR Earnestine Leys, MD      . clindamycin (CLEOCIN) IVPB 600 mg  600 mg Intravenous On Call to OR Earnestine Leys, MD      . lisinopril (PRINIVIL,ZESTRIL) tablet 20 mg  20 mg Oral BID Lance Coon, MD      . morphine 4 MG/ML injection 4 mg  4 mg Intravenous Q4H PRN Lance Coon, MD      . ondansetron [facility]) tablet 4 mg  4 mg Oral Q6H PRN Lance Coon, MD       Or  . ondansetron [facility]) injection 4 mg  4 mg  Intravenous Q6H PRN Lance Coon, MD      . oxyCODONE (Oxy IR/ROXICODONE) immediate release tablet 5 mg  5 mg Oral Q4H PRN Lance Coon, MD      . pravastatin (PRAVACHOL) tablet 20 mg  20 mg Oral QHS Lance Coon, MD         Discharge Medications: Please see discharge summary for a list of discharge medications.  Relevant Imaging Results:  Relevant Lab Results:   Additional Information  (SSN: [id number])  Audreyanna Butkiewicz, Veronia Beets, LCSW

## 2017-02-12 NOTE — Anesthesia Preprocedure Evaluation (Addendum)
Anesthesia Evaluation  Patient identified by MRN, date of birth, ID band Patient awake    Reviewed: Allergy & Precautions, NPO status , Patient's Chart, lab work & pertinent test results, reviewed documented beta blocker date and time   History of Anesthesia Complications Negative for: history of anesthetic complications  Airway Mallampati: I  TM Distance: >3 FB Neck ROM: limited    Dental  (+) Chipped, Dental Advidsory Given   Pulmonary neg pulmonary ROS,           Cardiovascular Exercise Tolerance: Good hypertension, Pt. on medications (-) angina(-) CAD, (-) Past MI, (-) Cardiac Stents and (-) CABG (-) dysrhythmias (-) Valvular Problems/Murmurs     Neuro/Psych negative neurological ROS  negative psych ROS   GI/Hepatic Neg liver ROS, hiatal hernia, neg GERD  ,  Endo/Other  negative endocrine ROS  Renal/GU Renal disease  negative genitourinary   Musculoskeletal  (+) Arthritis ,   Abdominal   Peds  Hematology negative hematology ROS (+)   Anesthesia Other Findings Hearing aids. IBS. 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  Past Medical History: No date: Arthritis     Comment: right knee, left shoulder/ OSTEO No date: Cancer ([place])     Comment: skin  No date: Degenerative disc disease, lumbar     Comment: L4-L5 No date: Diverticulosis     Comment: ITIS No date: History of hiatal hernia No date: Hydronephrosis     Comment: right No date: Hypercholesteremia No date: Hypertension     Comment: CONTROLLED ON MEDS No date: IBS (irritable bowel syndrome) 03/05/2015: Knee fracture, right     Comment: DIFFICULTY WITH AMBULATION No date: Osteopenia No date: Ovarian cyst No date: Pancreatic insufficiency No date: Wears hearing aid     Comment: bilateral   Reproductive/Obstetrics negative OB ROS                                                            Anesthesia Evaluation  Patient identified by MRN,  date of birth, ID band Patient awake    Reviewed: Allergy & Precautions, NPO status , Patient's Chart, lab work & pertinent test results, reviewed documented beta blocker date and time   Airway Mallampati: II  TM Distance: >3 FB     Dental  (+) Chipped   Pulmonary           Cardiovascular hypertension, Pt. on medications      Neuro/Psych    GI/Hepatic hiatal hernia,   Endo/Other    Renal/GU Renal disease     Musculoskeletal  (+) Arthritis ,   Abdominal   Peds  Hematology   Anesthesia Other Findings Hearing aids. IBS. 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  Reproductive/Obstetrics                            Anesthesia Physical Anesthesia Plan  ASA: III  Anesthesia Plan: Spinal   Post-op Pain Management:    Induction:   PONV Risk Score and Plan:   Airway Management Planned:   Additional Equipment:   Intra-op Plan:   Post-operative Plan:   Informed Consent: I have reviewed the patients History and Physical, chart, labs and discussed the procedure including the risks, benefits and alternatives for the proposed anesthesia with the patient or authorized representative  who has indicated his/her understanding and acceptance.     Plan Discussed with: CRNA  Anesthesia Plan Comments:         Anesthesia Quick Evaluation  Anesthesia Physical Anesthesia Plan  ASA: III  Anesthesia Plan: Spinal   Post-op Pain Management:    Induction:   PONV Risk Score and Plan: 3 and Ondansetron, Dexamethasone and Propofol  Airway Management Planned: Nasal Cannula and Natural Airway  Additional Equipment:   Intra-op Plan:   Post-operative Plan:   Informed Consent: I have reviewed the patients History and Physical, chart, labs and discussed the procedure including the risks, benefits and alternatives for the proposed anesthesia with the patient or authorized representative who has indicated his/her understanding and acceptance.    Dental advisory given  Plan Discussed with: CRNA  Anesthesia Plan Comments:        Anesthesia Quick Evaluation

## 2017-02-13 LAB — URINALYSIS, COMPLETE (UACMP) WITH MICROSCOPIC
BILIRUBIN URINE: NEGATIVE
Glucose, UA: NEGATIVE mg/dL
HGB URINE DIPSTICK: NEGATIVE
KETONES UR: 5 mg/dL — AB
NITRITE: NEGATIVE
Protein, ur: NEGATIVE mg/dL
Specific Gravity, Urine: 1.008 (ref 1.005–1.030)
pH: 6 (ref 5.0–8.0)

## 2017-02-13 LAB — CBC
HCT: 30.7 % — ABNORMAL LOW (ref 35.0–47.0)
Hemoglobin: 10.3 g/dL — ABNORMAL LOW (ref 12.0–16.0)
MCH: 29.1 pg (ref 26.0–34.0)
MCHC: 33.6 g/dL (ref 32.0–36.0)
MCV: 86.5 fL (ref 80.0–100.0)
PLATELETS: 209 10*3/uL (ref 150–440)
RBC: 3.55 MIL/uL — ABNORMAL LOW (ref 3.80–5.20)
RDW: 14.1 % (ref 11.5–14.5)
WBC: 12.9 10*3/uL — AB (ref 3.6–11.0)

## 2017-02-13 LAB — BASIC METABOLIC PANEL
Anion gap: 8 (ref 5–15)
BUN: 11 mg/dL (ref 6–20)
CALCIUM: 9.2 mg/dL (ref 8.9–10.3)
CO2: 25 mmol/L (ref 22–32)
CREATININE: 0.92 mg/dL (ref 0.44–1.00)
Chloride: 101 mmol/L (ref 101–111)
GFR calc Af Amer: >60 mL/min (ref >60)
GFR, EST NON AFRICAN AMERICAN: 56 mL/min — AB (ref >60)
GLUCOSE: 108 mg/dL — AB (ref 65–99)
Potassium: 3.6 mmol/L (ref 3.5–5.1)
Sodium: 134 mmol/L — ABNORMAL LOW (ref 135–145)

## 2017-02-13 MED ORDER — PRAVASTATIN SODIUM 20 MG PO TABS: 20.0000 mg | ORAL_TABLET | Freq: Every day | ORAL | Status: DC

## 2017-02-13 MED ORDER — AMLODIPINE BESYLATE 5 MG PO TABS
5.0000 mg | ORAL_TABLET | Freq: Every day | ORAL | Status: DC
  Administered 2017-02-14: 5 mg via ORAL
  Filled 2017-02-13 (×2): qty 1

## 2017-02-13 MED ORDER — LISINOPRIL 20 MG PO TABS
20.0000 mg | ORAL_TABLET | Freq: Every day | ORAL | Status: DC
  Administered 2017-02-13 – 2017-02-14 (×2): 20 mg via ORAL
  Filled 2017-02-13 (×2): qty 1

## 2017-02-13 NOTE — Anesthesia Postprocedure Evaluation (Signed)
Anesthesia Post Note  Patient: Kellie Harris  Procedure(s) Performed: Procedure(s) (LRB): INTRAMEDULLARY (IM) NAIL INTERTROCHANTRIC (Left)  Anesthesia Type: Spinal Level of consciousness: awake and awake and alert Pain management: pain level controlled Vital Signs Assessment: post-procedure vital signs reviewed and stable Respiratory status: spontaneous breathing and nonlabored ventilation Cardiovascular status: blood pressure returned to baseline and stable Postop Assessment: no headache, no backache, adequate PO intake, no signs of nausea or vomiting and patient able to bend at knees Anesthetic complications: no     Last Vitals:  Vitals:   02/13/17 0409 02/13/17 0715  BP: (!) 134/58 134/68  Pulse: 82 85  Resp: 18 16  Temp: 36.7 C 36.9 C    Last Pain:  Vitals:   02/13/17 0715  TempSrc: Oral  PainSc: Asleep                 Cadan Maggart,  Vaneta Hammontree R

## 2017-02-13 NOTE — Evaluation (Signed)
Physical Therapy Evaluation Patient Details Name: Kellie [facility] MRN: [id number] DOB: 08-26-1930 Today's Date: 02/13/2017   History of Present Illness  Patient reports she was eating with a friend at [employer] and as she was leaving she stepped off the curb and fell backwards into a sitting position.  EMS was called and she was admitted to [facility] with a left hip intertrochanteric fracture with IM nailing on 02-12-17.  She also has a history of problems with her other knee on the right and often wears a brace when going out for support.   Clinical Impression  Patient had been quite independent prior to this admission for L hip fx after a fall while out to eat. She is experiencing significant pain with mobility which is limiting her ability to participate in there-ex and bed mobility/transfers/gait. She has poor quadriceps output currently and has a history of L shoulder functional disuse secondary to pain/decreased ROM. Given the above she has difficulty maintaining PWBing in very limited gait bout and more or less shuffles her feet to transfer from the bed to the chair. She would benefit from short term rehabilitation when medically appropriate for discharge to increase her strength and balance.     Follow Up Recommendations SNF    Equipment Recommendations  Rolling walker with 5" wheels    Recommendations for Other Services       Precautions / Restrictions Precautions Precautions: Fall Restrictions Weight Bearing Restrictions: Yes LLE Weight Bearing: Partial weight bearing      Mobility  Bed Mobility Overal bed mobility: Needs Assistance Bed Mobility: Supine to Sit     Supine to sit: Mod assist;Max assist     General bed mobility comments: Patient is quite strong with RLE in bridging and bringing her RLE off the EOB, she is limited with LLE and trunk secondary to pain currently.   Transfers Overall transfer level: Needs assistance Equipment used: Rolling walker (2  wheeled) Transfers: Sit to/from Stand   Stand pivot transfers: Mod assist;Max assist       General transfer comment: Patient requires significant assistance to get to standing position secondary to pain/poor force output from LLE, cuing to maintain PWB.   Ambulation/Gait Ambulation/Gait assistance: Mod assist Ambulation Distance (Feet): 3 Feet Assistive device: Rolling walker (2 wheeled) Gait Pattern/deviations: Decreased stance time - left;Antalgic   Gait velocity interpretation: <1.8 ft/sec, indicative of risk for recurrent falls General Gait Details: Once patient is in standing she shuffles to bedside recliner, she put adequate force through her UEs though is quite limited with her LUE secondary to pain/functional deficits prior to this admission.   Stairs            Wheelchair Mobility    Modified Rankin (Stroke Patients Only)       Balance                                             Pertinent Vitals/Pain Pain Assessment: Faces Faces Pain Scale: Hurts even more Pain Location: left hip Pain Descriptors / Indicators: Aching;Operative site guarding;Sore Pain Intervention(s): Limited activity within patient's tolerance;Monitored during session;Repositioned    Home Living Family/patient expects to be discharged to:: Private residence Living Arrangements: Alone Available Help at Discharge: Family Type of Home: House Home Access: Stairs to enter   [employer] of Steps: 1 step  Home Layout: One level Home Equipment: Cane - single  point;Walker - 2 wheels Additional Comments: Patient keeps her cane and walker in the car but doesn't normally use it unless she is fatigued or walking extended distances.     Prior Function Level of Independence: Independent               Hand Dominance   Dominant Hand: Right    Extremity/Trunk Assessment   Upper Extremity Assessment Upper Extremity Assessment: LUE deficits/detail LUE Deficits  / Details: Her L arm had notable palpable "popping" sensations, possible subluxations? She has chronic L shoulder pain and has difficulty bearing weight through it secondary to pain.  LUE: Unable to fully assess due to pain    Lower Extremity Assessment Lower Extremity Assessment: LLE deficits/detail LLE Deficits / Details: Requires assist to go part way through range of hip abduction, SLR, heel slide, LAQ currently secondary to pain  LLE: Unable to fully assess due to pain       Communication   Communication: HOH (has hearing aids)  Cognition Arousal/Alertness: Awake/alert Behavior During Therapy: WFL for tasks assessed/performed Overall Cognitive Status: Within Functional Limits for tasks assessed                                        General Comments      Exercises Total Joint Exercises Heel Slides: AROM;Both;5 reps Hip ABduction/ADduction: AROM;AAROM;Both;10 reps Straight Leg Raises: AROM;Right;AAROM;Left;10 reps Long Arc Quad: AROM;Right;AAROM;Left;10 reps   Assessment/Plan    PT Assessment Patient needs continued PT services  PT Problem List Decreased strength;Decreased range of motion;Decreased mobility;Pain;Decreased knowledge of use of DME;Decreased balance;Decreased activity tolerance       PT Treatment Interventions DME instruction;Therapeutic activities;Therapeutic exercise;Gait training;Stair training;Balance training;Neuromuscular re-education;Manual techniques    PT Goals (Current goals can be found in the Care Plan section)  Acute Rehab PT Goals Patient Stated Goal: Patient would like to be able to take care of herself and eventually go home.  She reports she realizes she will have to go to [facility]. PT Goal Formulation: With patient/family Time For Goal Achievement: 02/27/17 Potential to Achieve Goals: Good    Frequency BID   Barriers to discharge        Co-evaluation               AM-PAC PT "6 Clicks" Daily Activity  Outcome  Measure Difficulty turning over in bed (including adjusting bedclothes, sheets and blankets)?: A Lot Difficulty moving from lying on back to sitting on the side of the bed? : A Lot Difficulty sitting down on and standing up from a chair with arms (e.g., wheelchair, bedside commode, etc,.)?: A Lot Help needed moving to and from a bed to chair (including a wheelchair)?: A Lot Help needed walking in hospital room?: A Lot Help needed climbing 3-5 steps with a railing? : Total 6 Click Score: 11    End of Session Equipment Utilized During Treatment: Gait belt Activity Tolerance: Patient tolerated treatment well;Patient limited by pain Patient left: in chair;with chair alarm set;with call bell/phone within reach;with family/visitor present Nurse Communication: Mobility status PT Visit Diagnosis: Muscle weakness (generalized) (M62.81);Difficulty in walking, not elsewhere classified (R26.2)    Time: 1829-9371 PT Time Calculation (min) (ACUTE ONLY): 25 min   Charges:   PT Evaluation $PT Eval Moderate Complexity: 1 Procedure PT Treatments $Therapeutic Exercise: 8-22 mins   PT G Codes:        Royce Macadamia PT, DPT,  CSCS    02/13/2017, 4:07 PM

## 2017-02-13 NOTE — Progress Notes (Signed)
Subjective:  POD#1 s/p intramedullary fixation for left intertrochanteric hip fracture. Patient reports left hip pain as mild to moderate.  Patient has no other complaints. She states she was able to get up today with physical therapy.  Objective:   VITALS:   Vitals:   02/12/17 2305 02/13/17 0002 02/13/17 0409 02/13/17 0715  BP: 121/67 (!) 115/59 (!) 134/58 134/68  Pulse: 85 77 82 85  Resp: 16 18 18 16   Temp: 98.5 F (36.9 C) 97.8 F (36.6 C) 98 F (36.7 C) 98.4 F (36.9 C)  TempSrc: Oral Oral Oral Oral  SpO2: 93% 94% 95% 93%  Weight:      Height:        PHYSICAL EXAM: Left lower extremity: Neurovascular intact Sensation intact distally Intact pulses distally Dorsiflexion/Plantar flexion intact Incision: scant drainage No cellulitis present Compartment soft  LABS  Results for orders placed or performed during the hospital encounter of 02/11/17 (from the past 24 hour(s))  CBC     Status: Abnormal   Collection Time: 02/13/17  3:20 AM  Result Value Ref Range   WBC 12.9 (H) 3.6 - 11.0 K/uL   RBC 3.55 (L) 3.80 - 5.20 MIL/uL   Hemoglobin 10.3 (L) 12.0 - 16.0 g/dL   HCT 30.7 (L) 35.0 - 47.0 %   MCV 86.5 80.0 - 100.0 fL   MCH 29.1 26.0 - 34.0 pg   MCHC 33.6 32.0 - 36.0 g/dL   RDW 14.1 11.5 - 14.5 %   Platelets 209 150 - 440 K/uL  Basic metabolic panel     Status: Abnormal   Collection Time: 02/13/17  3:20 AM  Result Value Ref Range   Sodium 134 (L) 135 - 145 mmol/L   Potassium 3.6 3.5 - 5.1 mmol/L   Chloride 101 101 - 111 mmol/L   CO2 25 22 - 32 mmol/L   Glucose, Bld 108 (H) 65 - 99 mg/dL   BUN 11 6 - 20 mg/dL   Creatinine, Ser 0.92 0.44 - 1.00 mg/dL   Calcium 9.2 8.9 - 10.3 mg/dL   GFR calc non Af Amer 56 (L) >60 mL/min   GFR calc Af Amer >60 >60 mL/min   Anion gap 8 5 - 15    Ct Head Wo Contrast  Result Date: 02/11/2017 CLINICAL DATA:  Altered mental status. Difficulty word finding after fall. EXAM: CT HEAD WITHOUT CONTRAST TECHNIQUE: Contiguous axial  images were obtained from the base of the skull through the vertex without intravenous contrast. COMPARISON:  07/08/2014 CT FINDINGS: BRAIN: There is sulcal and ventricular prominence consistent with superficial and central atrophy. No intraparenchymal hemorrhage, mass effect nor midline shift. Periventricular and subcortical white matter hypodensities consistent with chronic small vessel ischemic disease are identified. Chronic right external capsule and thalamic lacunar infarcts are again noted. No acute large vascular territory infarcts. No abnormal extra-axial fluid collections. Basal cisterns are not effaced and midline. VASCULAR: Moderate calcific atherosclerosis of the carotid siphons and both vertebral arteries. SKULL: No skull fracture. No significant scalp soft tissue swelling. SINUSES/ORBITS: The mastoid air-cells are clear. The included paranasal sinuses are well-aerated.The included ocular globes and orbital contents are non-suspicious. Bilateral lens replacement surgeries are noted. OTHER: None. IMPRESSION: Chronic cerebral atrophy with small vessel ischemic disease of periventricular and subcortical white matter. No acute intracranial appearing abnormality by CT. Electronically Signed   By: Ashley Royalty M.D.   On: 02/11/2017 21:01   Mr Brain Wo Contrast  Result Date: 02/11/2017 CLINICAL DATA:  LEFT hip pain. Altered  mental status. History of hypertension. EXAM: MRI HEAD WITHOUT CONTRAST TECHNIQUE: Multiplanar, multiecho pulse sequences of the brain and surrounding structures were obtained without intravenous contrast. COMPARISON:  CT HEAD February 11, 2017 at 2044 hours and MRI of the head July 09, 2014 FINDINGS: BRAIN: No reduced diffusion to suggest acute ischemia. No susceptibility artifact to suggest hemorrhage. The ventricles and sulci are normal for patient's age. Old RIGHT thalamus lacunar infarct. Patchy to confluent supratentorial white matter FLAIR T2 hyperintensities within cystic small  infarct RIGHT frontal lobe white matter. Old RIGHT basal ganglia lacunar infarct. Multiple old small bilateral cerebellar infarcts. No midline shift, mass effect or masses. No abnormal extra-axial fluid collections. VASCULAR: Normal major intracranial vascular flow voids present at skull base. SKULL AND UPPER CERVICAL SPINE: No abnormal sellar expansion. No suspicious calvarial bone marrow signal. Craniocervical junction maintained. SINUSES/ORBITS: Chronic LEFT maxillary sinusitis with atresia. Mastoid air cells are well aerated. The included ocular globes and orbital contents are non-suspicious. Status post bilateral ocular lens implants. OTHER: None. IMPRESSION: 1. No acute intracranial process. 2. Old RIGHT basal ganglia, RIGHT thalamus and bilateral cerebella lacunar infarcts. 3. Moderate chronic small vessel ischemic disease. Electronically Signed   By: Elon Alas M.D.   On: 02/11/2017 22:45   Dg Hip Operative Unilat W Or W/o Pelvis Left  Result Date: 02/12/2017 CLINICAL DATA:  Intraoperative imaging for fixation of a left hip fracture suffered in a fall 02/11/2017. Initial encounter. EXAM: OPERATIVE LEFT HIP (WITH PELVIS IF PERFORMED) 2 VIEWS TECHNIQUE: Fluoroscopic spot image(s) were submitted for interpretation post-operatively. COMPARISON:  Plain films left hip 02/11/2017. FINDINGS: 2 intraoperative fluoroscopic spot views of the left hip demonstrate an intramedullary nail and hip screw in place for fixation of an intertrochanteric fracture. No acute abnormality is identified. Position and alignment of the patient's fracture are anatomic. The intramedullary nail is incompletely imaged. IMPRESSION: Fixation of a left hip fracture as described.  No acute finding. Electronically Signed   By: Inge Rise M.D.   On: 02/12/2017 18:02   Dg Hip Unilat W Or Wo Pelvis 2-3 Views Left  Result Date: 02/11/2017 CLINICAL DATA:  Left hip pain after fall today at [employer]. Fall on rocks. EXAM: DG HIP  (WITH OR WITHOUT PELVIS) 2-3V LEFT COMPARISON:  None. FINDINGS: Intertrochanteric hip fracture is minimally displaced about the greater trochanter. Nondisplaced involvement the lesser trochanter. Possible minimal vertically-oriented involvement of the IS subtrochanteric femur seen only on a single view. Femoral head remains seated. Remainder the bony pelvis is intact including the pubic rami. Pubic symphysis and sacroiliac joints are congruent. Bones are under mineralized. IMPRESSION: Minimally displaced intertrochanteric left hip fracture. Possible longitudinally-oriented extension to the subtrochanteric femur. Electronically Signed   By: Jeb Levering M.D.   On: 02/11/2017 20:30    Assessment/Plan: 1 Day Post-Op   Principal Problem:   Closed left hip fracture (HCC) Active Problems:   HTN (hypertension)   HLD (hyperlipidemia)  Patient is stable postop. Her hemoglobin and hematocrit are stable. Continue physical therapy. Patient completed 24 hours postop antibiotics. She'll begin Lovenox for DVT prophylaxis. Her Foley catheter has been removed. Continue current pain management.    Thornton Park , MD 02/13/2017, 12:23 PM

## 2017-02-13 NOTE — Clinical Social Work Note (Addendum)
CSW presented bed offers to patient and she chose to go to [facility] [place].  CSW contacted [facility] and they can accept patient once she is medically ready for discharge and orders have been received.  Insurance company authorization has been received authorization number is [id number], [facility] has been notified, patient and family have been updated.  Unit CSW to continue to follow patient's progress throughout discharge planning.  Jones Broom. [place], MSW, [place]  02/13/2017 3:24 PM

## 2017-02-13 NOTE — Progress Notes (Signed)
Subjective: Patient has no complaints this morning.  Objective: Vital signs in last 24 hours: Temp:  [97.8 F (36.6 C)-99.4 F (37.4 C)] 98.4 F (36.9 C) (07/04 0715) Pulse Rate:  [74-93] 85 (07/04 0715) Resp:  [14-23] 16 (07/04 0715) BP: (109-158)/(58-86) 134/68 (07/04 0715) SpO2:  [93 %-99 %] 93 % (07/04 0715) FiO2 (%):  [28 %] 28 % (07/03 1946) Weight change:  Last BM Date: 02/11/17  Intake/Output from previous day: 07/03 0701 - 07/04 0700 In: 1663.8 [I.V.:1363.8; IV Piggyback:300] Out: 1500 [Urine:1475; Blood:25] Intake/Output this shift: Total I/O In: 120 [P.O.:120] Out: -   General appearance: no distress Resp: clear to auscultation bilaterally Cardio: regular rate and rhythm, S1, S2 normal, no murmur, click, rub or gallop Neurologic: Grossly normal  Lab Results:  Recent Labs  02/12/17 0457 02/13/17 0320  WBC 20.7* 12.9*  HGB 11.6* 10.3*  HCT 34.9* 30.7*  PLT 275 209   BMET  Recent Labs  02/12/17 0457 02/13/17 0320  NA 133* 134*  K 3.7 3.6  CL 101 101  CO2 25 25  GLUCOSE 153* 108*  BUN 11 11  CREATININE 0.88 0.92  CALCIUM 9.8 9.2    Studies/Results: Ct Head Wo Contrast  Result Date: 02/11/2017 CLINICAL DATA:  Altered mental status. Difficulty word finding after fall. EXAM: CT HEAD WITHOUT CONTRAST TECHNIQUE: Contiguous axial images were obtained from the base of the skull through the vertex without intravenous contrast. COMPARISON:  07/08/2014 CT FINDINGS: BRAIN: There is sulcal and ventricular prominence consistent with superficial and central atrophy. No intraparenchymal hemorrhage, mass effect nor midline shift. Periventricular and subcortical white matter hypodensities consistent with chronic small vessel ischemic disease are identified. Chronic right external capsule and thalamic lacunar infarcts are again noted. No acute large vascular territory infarcts. No abnormal extra-axial fluid collections. Basal cisterns are not effaced and midline.  VASCULAR: Moderate calcific atherosclerosis of the carotid siphons and both vertebral arteries. SKULL: No skull fracture. No significant scalp soft tissue swelling. SINUSES/ORBITS: The mastoid air-cells are clear. The included paranasal sinuses are well-aerated.The included ocular globes and orbital contents are non-suspicious. Bilateral lens replacement surgeries are noted. OTHER: None. IMPRESSION: Chronic cerebral atrophy with small vessel ischemic disease of periventricular and subcortical white matter. No acute intracranial appearing abnormality by CT. Electronically Signed   By: Ashley Royalty M.D.   On: 02/11/2017 21:01   Mr Brain Wo Contrast  Result Date: 02/11/2017 CLINICAL DATA:  LEFT hip pain. Altered mental status. History of hypertension. EXAM: MRI HEAD WITHOUT CONTRAST TECHNIQUE: Multiplanar, multiecho pulse sequences of the brain and surrounding structures were obtained without intravenous contrast. COMPARISON:  CT HEAD February 11, 2017 at 2044 hours and MRI of the head July 09, 2014 FINDINGS: BRAIN: No reduced diffusion to suggest acute ischemia. No susceptibility artifact to suggest hemorrhage. The ventricles and sulci are normal for patient's age. Old RIGHT thalamus lacunar infarct. Patchy to confluent supratentorial white matter FLAIR T2 hyperintensities within cystic small infarct RIGHT frontal lobe white matter. Old RIGHT basal ganglia lacunar infarct. Multiple old small bilateral cerebellar infarcts. No midline shift, mass effect or masses. No abnormal extra-axial fluid collections. VASCULAR: Normal major intracranial vascular flow voids present at skull base. SKULL AND UPPER CERVICAL SPINE: No abnormal sellar expansion. No suspicious calvarial bone marrow signal. Craniocervical junction maintained. SINUSES/ORBITS: Chronic LEFT maxillary sinusitis with atresia. Mastoid air cells are well aerated. The included ocular globes and orbital contents are non-suspicious. Status post bilateral ocular  lens implants. OTHER: None. IMPRESSION: 1. No acute intracranial  process. 2. Old RIGHT basal ganglia, RIGHT thalamus and bilateral cerebella lacunar infarcts. 3. Moderate chronic small vessel ischemic disease. Electronically Signed   By: Elon Alas M.D.   On: 02/11/2017 22:45   Dg Hip Operative Unilat W Or W/o Pelvis Left  Result Date: 02/12/2017 CLINICAL DATA:  Intraoperative imaging for fixation of a left hip fracture suffered in a fall 02/11/2017. Initial encounter. EXAM: OPERATIVE LEFT HIP (WITH PELVIS IF PERFORMED) 2 VIEWS TECHNIQUE: Fluoroscopic spot image(s) were submitted for interpretation post-operatively. COMPARISON:  Plain films left hip 02/11/2017. FINDINGS: 2 intraoperative fluoroscopic spot views of the left hip demonstrate an intramedullary nail and hip screw in place for fixation of an intertrochanteric fracture. No acute abnormality is identified. Position and alignment of the patient's fracture are anatomic. The intramedullary nail is incompletely imaged. IMPRESSION: Fixation of a left hip fracture as described.  No acute finding. Electronically Signed   By: Inge Rise M.D.   On: 02/12/2017 18:02   Dg Hip Unilat W Or Wo Pelvis 2-3 Views Left  Result Date: 02/11/2017 CLINICAL DATA:  Left hip pain after fall today at [employer]. Fall on rocks. EXAM: DG HIP (WITH OR WITHOUT PELVIS) 2-3V LEFT COMPARISON:  None. FINDINGS: Intertrochanteric hip fracture is minimally displaced about the greater trochanter. Nondisplaced involvement the lesser trochanter. Possible minimal vertically-oriented involvement of the IS subtrochanteric femur seen only on a single view. Femoral head remains seated. Remainder the bony pelvis is intact including the pubic rami. Pubic symphysis and sacroiliac joints are congruent. Bones are under mineralized. IMPRESSION: Minimally displaced intertrochanteric left hip fracture. Possible longitudinally-oriented extension to the subtrochanteric femur.  Electronically Signed   By: Jeb Levering M.D.   On: 02/11/2017 20:30    Medications: I have reviewed the patient's current medications.  Assessment/Plan: 1. Hip fracture. Patient underwent surgical repair yesterday. Tolerated procedure well. We'll be starting physical therapy today.  2. Hypertension. Controlled with current medications. 3. Hyperlipidemia. Continue her statin next line  LOS: 1 day   Baxter Hire 02/13/2017, 10:37 AM

## 2017-02-14 ENCOUNTER — Encounter: Payer: Self-pay | Admitting: Specialist

## 2017-02-14 DIAGNOSIS — E569 Vitamin deficiency, unspecified: Secondary | ICD-10-CM | POA: Diagnosis not present

## 2017-02-14 DIAGNOSIS — E785 Hyperlipidemia, unspecified: Secondary | ICD-10-CM | POA: Diagnosis not present

## 2017-02-14 DIAGNOSIS — Z7401 Bed confinement status: Secondary | ICD-10-CM | POA: Diagnosis not present

## 2017-02-14 DIAGNOSIS — S72002A Fracture of unspecified part of neck of left femur, initial encounter for closed fracture: Secondary | ICD-10-CM | POA: Diagnosis not present

## 2017-02-14 DIAGNOSIS — K5900 Constipation, unspecified: Secondary | ICD-10-CM | POA: Diagnosis not present

## 2017-02-14 DIAGNOSIS — M25552 Pain in left hip: Secondary | ICD-10-CM | POA: Diagnosis not present

## 2017-02-14 DIAGNOSIS — S72142D Displaced intertrochanteric fracture of left femur, subsequent encounter for closed fracture with routine healing: Secondary | ICD-10-CM | POA: Diagnosis not present

## 2017-02-14 DIAGNOSIS — M6281 Muscle weakness (generalized): Secondary | ICD-10-CM | POA: Diagnosis not present

## 2017-02-14 DIAGNOSIS — G8918 Other acute postprocedural pain: Secondary | ICD-10-CM | POA: Diagnosis not present

## 2017-02-14 DIAGNOSIS — S72143D Displaced intertrochanteric fracture of unspecified femur, subsequent encounter for closed fracture with routine healing: Secondary | ICD-10-CM | POA: Diagnosis not present

## 2017-02-14 DIAGNOSIS — I10 Essential (primary) hypertension: Secondary | ICD-10-CM | POA: Diagnosis not present

## 2017-02-14 LAB — BASIC METABOLIC PANEL WITH GFR
Anion gap: 6 (ref 5–15)
BUN: 12 mg/dL (ref 6–20)
CO2: 27 mmol/L (ref 22–32)
Calcium: 9.5 mg/dL (ref 8.9–10.3)
Chloride: 100 mmol/L — ABNORMAL LOW (ref 101–111)
Creatinine, Ser: 0.91 mg/dL (ref 0.44–1.00)
GFR calc Af Amer: >60 mL/min
GFR calc non Af Amer: 57 mL/min — ABNORMAL LOW
Glucose, Bld: 103 mg/dL — ABNORMAL HIGH (ref 65–99)
Potassium: 3.2 mmol/L — ABNORMAL LOW (ref 3.5–5.1)
Sodium: 133 mmol/L — ABNORMAL LOW (ref 135–145)

## 2017-02-14 LAB — CBC
HEMATOCRIT: 27.5 % — AB (ref 35.0–47.0)
HEMOGLOBIN: 9.5 g/dL — AB (ref 12.0–16.0)
MCH: 29.4 pg (ref 26.0–34.0)
MCHC: 34.4 g/dL (ref 32.0–36.0)
MCV: 85.3 fL (ref 80.0–100.0)
Platelets: 194 10*3/uL (ref 150–440)
RBC: 3.22 MIL/uL — AB (ref 3.80–5.20)
RDW: 14 % (ref 11.5–14.5)
WBC: 10.9 10*3/uL (ref 3.6–11.0)

## 2017-02-14 MED ORDER — POTASSIUM CHLORIDE CRYS ER 10 MEQ PO TBCR
10.0000 meq | EXTENDED_RELEASE_TABLET | Freq: Every day | ORAL | Status: DC
  Administered 2017-02-14: 10 meq via ORAL
  Filled 2017-02-14: qty 1

## 2017-02-14 MED ORDER — ASPIRIN EC 325 MG PO TBEC
325.0000 mg | DELAYED_RELEASE_TABLET | Freq: Every day | ORAL | Status: DC
  Administered 2017-02-14: 325 mg via ORAL
  Filled 2017-02-14: qty 1

## 2017-02-14 MED ORDER — HYDROCODONE-ACETAMINOPHEN 5-325 MG PO TABS: 1.0000 | ORAL_TABLET | Freq: Four times a day (QID) | ORAL | 0 refills | Status: DC | PRN

## 2017-02-14 MED ORDER — SENNA 8.6 MG PO TABS: 1.0000 | ORAL_TABLET | Freq: Two times a day (BID) | ORAL | 0 refills | Status: DC

## 2017-02-14 NOTE — Progress Notes (Signed)
Patient is medically stable for D/C to [facility] today. Per [facility] liaison patient can come today to room 808. [facility] authorization has been received, auth # [id number]. RN will call report to RN Kellie [facility] at [phone] and arrange EMS for transport. Clinical Education officer, museum (CSW) sent D/C orders to [facility] via [facility]. Patient is aware of above. CSW contacted patient's daughter Kellie [facility] and made her aware of above. Please reconsult if future social work needs arise. CSW signing off.   [employer], LCSW [phone]

## 2017-02-14 NOTE — Care Management Important Message (Signed)
Important Message  Patient Details  Name: Kellie [facility] MRN: [id number] Date of Birth: [birth year]   Medicare Important Message Given:  Yes    Jolly Mango, RN 02/14/2017, 1:54 PM

## 2017-02-14 NOTE — Progress Notes (Signed)
Patient had BM this shift after suppository was given. Called report to [employer] at [facility]. Belongings packed up. EMS called for transport to [facility].   Bethann Punches, RN

## 2017-02-14 NOTE — Progress Notes (Signed)
Physical Therapy Treatment Patient Details Name: Kellie Harris MRN: [id number] DOB: [birth year] Today's Date: 02/14/2017    History of Present Illness Patient reports she was eating with a friend at [employer] and as she was leaving she stepped off the curb and fell backwards into a sitting position.  EMS was called and she was admitted to [facility] with a left hip intertrochanteric fracture with IM nailing on 02-12-17.  She also has a history of problems with her other knee on the right and often wears a brace when going out for support.     PT Comments    Initial attempt, pt working with OT. Second attempt, pt fatigued, but agreeable to exercises only at this time. Pt already up in chair and participates well with long sit exercises requiring minimal assist on left to prevent friction along chair with movement. Pt educated to perform exercises she is able to do without assist bilaterally throughout the day; pt understands.  Pt repositioned with pillow on the right trunk/under upper extremity due to right lateral lean with improved comfort. Plan to see pt this afternoon for continued PT to progress strength, endurance and hopeful progression of mobility to improve function.   Follow Up Recommendations  SNF     Equipment Recommendations  Rolling walker with 5" wheels    Recommendations for Other Services       Precautions / Restrictions Precautions Precautions: Fall Restrictions Weight Bearing Restrictions: Yes LLE Weight Bearing: Partial weight bearing    Mobility  Bed Mobility Overal bed mobility: Needs Assistance Bed Mobility: Supine to Sit     Supine to sit: Min assist     General bed mobility comments: Not tested; up in chair  Transfers Overall transfer level: Needs assistance Equipment used: Rolling walker (2 wheeled) Transfers: Sit to/from Stand Sit to Stand: Min assist Stand pivot transfers: Min assist       General transfer comment: Not tested; pt requests waiting  until afternoon due to fatigue from OT session  Ambulation/Gait                 Stairs            Wheelchair Mobility    Modified Rankin (Stroke Patients Only)       Balance Overall balance assessment: Needs assistance Sitting-balance support: Single extremity supported;Feet supported Sitting balance-Leahy Scale: Fair     Standing balance support: Bilateral upper extremity supported Standing balance-Leahy Scale: Fair                              Cognition Arousal/Alertness: Awake/alert Behavior During Therapy: WFL for tasks assessed/performed Overall Cognitive Status: Within Functional Limits for tasks assessed                                        Exercises General Exercises - Lower Extremity Ankle Circles/Pumps: AROM;Both;20 reps Quad Sets: Strengthening;Both;10 reps Gluteal Sets: Strengthening;Both;10 reps Short Arc Quad: AROM;Both;10 reps Heel Slides: AAROM;Left;10 reps (AROM R) Hip ABduction/ADduction: AAROM;Left;10 reps (AROM R)    General Comments        Pertinent Vitals/Pain Pain Assessment: 0-10 Pain Score: 6  Pain Location: left hip Pain Descriptors / Indicators: Aching;Constant;Operative site guarding Pain Intervention(s): Limited activity within patient's tolerance;Monitored during session;Patient requesting pain meds-RN notified    Home Living  Prior Function            PT Goals (current goals can now be found in the care plan section) Acute Rehab PT Goals Patient Stated Goal: Patient would like to be able to take care of herself and eventually go home.  She reports she realizes she will have to go to STR. Progress towards PT goals: Progressing toward goals    Frequency    BID      PT Plan Current plan remains appropriate    Co-evaluation              AM-PAC PT "6 Clicks" Daily Activity  Outcome Measure  Difficulty turning over in bed (including  adjusting bedclothes, sheets and blankets)?: A Lot Difficulty moving from lying on back to sitting on the side of the bed? : Total Difficulty sitting down on and standing up from a chair with arms (e.g., wheelchair, bedside commode, etc,.)?: Total Help needed moving to and from a bed to chair (including a wheelchair)?: A Lot Help needed walking in hospital room?: A Lot Help needed climbing 3-5 steps with a railing? : Total 6 Click Score: 9    End of Session   Activity Tolerance: Patient tolerated treatment well;Patient limited by fatigue Patient left: in chair;with chair alarm set;with call bell/phone within reach;with family/visitor present   PT Visit Diagnosis: Muscle weakness (generalized) (M62.81);Difficulty in walking, not elsewhere classified (R26.2)     Time: 1308-6578 PT Time Calculation (min) (ACUTE ONLY): 16 min  Charges:  $Therapeutic Exercise: 8-22 mins                    G Codes:        Larae Grooms, PTA 02/14/2017, 12:41 PM

## 2017-02-14 NOTE — Discharge Summary (Signed)
Physician Discharge Summary  Patient ID: Kellie Harris MRN: [id number] DOB/AGE: 03/28/1931 83 y.o.  Admit date: 02/11/2017 Discharge date: 02/14/2017  Admission Diagnoses:1. Hip fracture 2 hypertension  3. Hyperlipidemia  Discharge Diagnoses:  Principal Problem:   Closed left hip fracture ([place]) Active Problems:   HTN (hypertension)   HLD (hyperlipidemia)   Discharged Condition: stable  Hospital Course: 1. Hip fracture. patient suffered a fall with a broken hip. See H&P for details. Orthopedics was consulted and patient had surgical repair. Tolerated procedure well. Being discharge today to rehabilitation  2. Hypertension. Controlled with current medications. 3. Hyperlipidemia. Continue her statin next line  Consults: orthopedic surgery   Discharge Exam: Blood pressure (!) 141/65, pulse 75, temperature 98.6 F (37 C), temperature source Oral, resp. rate 18, height 4\' 11"  (1.499 m), weight 58 kg (127 lb 12.8 oz), SpO2 96 %. General appearance: no distress Resp: clear to auscultation bilaterally Cardio: regular rate and rhythm, S1, S2 normal, no murmur, click, rub or gallop  Disposition: 01-Home or Self Care  Discharge Instructions    Diet - low sodium heart healthy    Complete by:  As directed    Increase activity slowly    Complete by:  As directed      Allergies as of 02/14/2017   No Known Allergies     Medication List    TAKE these medications   amLODipine 5 MG tablet Commonly known as:  NORVASC Take 5 mg by mouth daily.   aspirin 81 MG tablet Take 81 mg by mouth daily.   HYDROcodone-acetaminophen 5-325 MG tablet Commonly known as:  NORCO/VICODIN Take 1 tablet by mouth every 6 (six) hours as needed for moderate pain.   lisinopril 20 MG tablet Commonly known as:  PRINIVIL,ZESTRIL Take 20 mg by mouth 2 (two) times daily.   multivitamin with minerals Tabs tablet Take 1 tablet by mouth daily.   pravastatin 20 MG tablet Commonly known as:  PRAVACHOL Take 20 mg  by mouth at bedtime.   senna 8.6 MG Tabs tablet Commonly known as:  SENOKOT Take 1 tablet (8.6 mg total) by mouth 2 (two) times daily.       Contact information for follow-up providers    Earnestine Leys, MD. Schedule an appointment as soon as possible for a visit on 02/26/2017.   Specialty:  Specialist Why:  Tuesday @ 10:00am  Contact information: [place] [id number] [phone]            Contact information for after-discharge care    Destination    [facility].   Specialty:  [place] information: [address] [place] [phone]                  Signed: Baxter Hire 02/14/2017, 1:16 PM

## 2017-02-14 NOTE — Clinical Social Work Placement (Signed)
   CLINICAL SOCIAL WORK PLACEMENT  NOTE  Date:  02/14/2017  Patient Details  Name: Kellie Harris MRN: [id number] Date of Birth: 24-Mar-1931  Clinical Social Work is seeking post-discharge placement for this patient at the [place] level of care (*CSW will initial, date and re-position this form in  chart as items are completed):  Yes   Patient/family provided with [place] Work Department's list of facilities offering this level of care within the geographic area requested by the patient (or if unable, by the patient's family).  Yes   Patient/family informed of their freedom to choose among providers that offer the needed level of care, that participate in Medicare, Medicaid or managed care program needed by the patient, have an available bed and are willing to accept the patient.  Yes   Patient/family informed of [facility]'s ownership interest in [facility] and [facility], as well as of the fact that they are under no obligation to receive care at these facilities.  PASRR submitted to EDS on 02/12/17     PASRR number received on 02/12/17     Existing PASRR number confirmed on       FL2 transmitted to all facilities in geographic area requested by pt/family on 02/12/17     FL2 transmitted to all facilities within larger geographic area on       Patient informed that his/her managed care company has contracts with or will negotiate with certain facilities, including the following:        Yes   Patient/family informed of bed offers received.  Patient chooses bed at  ([facility] )     Physician recommends and patient chooses bed at      Patient to be transferred to  ([facility] ) on 02/14/17.  Patient to be transferred to facility by  [facility] )     Patient family notified on 02/14/17 of transfer.  Name of family member notified:   (Patient's daughter [name] is aware of D/C today. )     PHYSICIAN       Additional Comment:     _______________________________________________ Ann-Marie Kluge, Veronia Beets, LCSW 02/14/2017, 1:43 PM

## 2017-02-14 NOTE — Plan of Care (Signed)
Problem: Pain Managment: Goal: General experience of comfort will improve Outcome: Progressing Patient states pain is well managed on current PRN pain medication regimen

## 2017-02-14 NOTE — Progress Notes (Signed)
EMS arrived to transport pt to [facility]. VS stable.   Bethann Punches, RN

## 2017-02-14 NOTE — Progress Notes (Signed)
Subjective: 2 Days Post-Op Procedure(s) (LRB): INTRAMEDULLARY (IM) NAIL INTERTROCHANTRIC (Left)    Patient reports pain as mild.  Objective: doing well.  csm good left leg,  No swelling.  Started PT.  Ready for D/C to SNF  VITALS:   Vitals:   02/14/17 0727 02/14/17 1219  BP: (!) 141/65   Pulse: 76 75  Resp: 18   Temp: 98.6 F (37 C)     Neurologically intact ABD soft Neurovascular intact Sensation intact distally Intact pulses distally Dorsiflexion/Plantar flexion intact Incision: scant drainage  LABS  Recent Labs  02/12/17 0457 02/13/17 0320 02/14/17 0313  HGB 11.6* 10.3* 9.5*  HCT 34.9* 30.7* 27.5*  WBC 20.7* 12.9* 10.9  PLT 275 209 194     Recent Labs  02/12/17 0457 02/13/17 0320 02/14/17 0313  NA 133* 134* 133*  K 3.7 3.6 3.2*  BUN 11 11 12   CREATININE 0.88 0.92 0.91  GLUCOSE 153* 108* 103*     Recent Labs  02/12/17 0958  INR 1.09     Assessment/Plan: 2 Days Post-Op Procedure(s) (LRB): INTRAMEDULLARY (IM) NAIL INTERTROCHANTRIC (Left)   Advance diet Up with therapy Discharge to SNF today  RTC 10-14 days

## 2017-02-14 NOTE — Progress Notes (Signed)
Subjective: Patient has no complaints  Objective: Vital signs in last 24 hours: Temp:  [98.4 F (36.9 C)-98.7 F (37.1 C)] 98.6 F (37 C) (07/05 0727) Pulse Rate:  [76-103] 76 (07/05 0727) Resp:  [18-19] 18 (07/05 0727) BP: (112-159)/(64-76) 141/65 (07/05 0727) SpO2:  [91 %-96 %] 96 % (07/05 0727) Weight change:  Last BM Date: 02/11/17  Intake/Output from previous day: 07/04 0701 - 07/05 0700 In: 951.3 [P.O.:240; I.V.:661.3; IV Piggyback:50] Out: 300 [Urine:300] Intake/Output this shift: No intake/output data recorded.  General appearance: no distress Resp: clear to auscultation bilaterally Cardio: regular rate and rhythm, S1, S2 normal, no murmur, click, rub or gallop  Lab Results:  Recent Labs  02/13/17 0320 02/14/17 0313  WBC 12.9* 10.9  HGB 10.3* 9.5*  HCT 30.7* 27.5*  PLT 209 194   BMET  Recent Labs  02/13/17 0320 02/14/17 0313  NA 134* 133*  K 3.6 3.2*  CL 101 100*  CO2 25 27  GLUCOSE 108* 103*  BUN 11 12  CREATININE 0.92 0.91  CALCIUM 9.2 9.5    Studies/Results: Dg Hip Operative Unilat W Or W/o Pelvis Left  Result Date: 02/12/2017 CLINICAL DATA:  Intraoperative imaging for fixation of a left hip fracture suffered in a fall 02/11/2017. Initial encounter. EXAM: OPERATIVE LEFT HIP (WITH PELVIS IF PERFORMED) 2 VIEWS TECHNIQUE: Fluoroscopic spot image(s) were submitted for interpretation post-operatively. COMPARISON:  Plain films left hip 02/11/2017. FINDINGS: 2 intraoperative fluoroscopic spot views of the left hip demonstrate an intramedullary nail and hip screw in place for fixation of an intertrochanteric fracture. No acute abnormality is identified. Position and alignment of the patient's fracture are anatomic. The intramedullary nail is incompletely imaged. IMPRESSION: Fixation of a left hip fracture as described.  No acute finding. Electronically Signed   By: Inge Rise M.D.   On: 02/12/2017 18:02    Medications: I have reviewed the patient's  current medications.  Assessment/Plan: 1. Hip fracture. Status post surgical repair. Tolerated procedure well. Will be transferred to rehabilitation when okay with or thyroid.  2. Hypertension. Controlled with current medications. 3. Hyperlipidemia. Continue her statin next line  LOS: 2 days   Baxter Hire 02/14/2017, 9:45 AM

## 2017-02-14 NOTE — Progress Notes (Signed)
Occupational Therapy Treatment Patient Details Name: Cindia Hustead MRN: [id number] DOB: 04-02-1931 Today's Date: 02/14/2017    History of present illness Patient reports she was eating with a friend at [place] and as she was leaving she stepped off the curb and fell backwards into a sitting position.  EMS was called and she was admitted to [facility] with a left hip intertrochanteric fracture with IM nailing on 02-12-17.  She also has a history of problems with her other knee on the right and often wears a brace when going out for support.    OT comments  Patient seen for ADL session this date for bathing, dressing, toileting, bed mobility and transfers as outlined below.  Patient improved in all areas compared to last date and is progressing towards goals.  She would still benefit from skilled OT in short term rehab to increase her independence in daily tasks.    Follow Up Recommendations  SNF    Equipment Recommendations       Recommendations for Other Services      Precautions / Restrictions Precautions Precautions: Fall Restrictions Weight Bearing Restrictions: Yes LLE Weight Bearing: Partial weight bearing       Mobility Bed Mobility Overal bed mobility: Needs Assistance Bed Mobility: Supine to Sit     Supine to sit: Min assist        Transfers Overall transfer level: Needs assistance Equipment used: Rolling walker (2 wheeled) Transfers: Sit to/from Stand Sit to Stand: Min assist Stand pivot transfers: Min assist            Balance Overall balance assessment: Needs assistance Sitting-balance support: Single extremity supported;Feet supported Sitting balance-Leahy Scale: Fair     Standing balance support: Bilateral upper extremity supported Standing balance-Leahy Scale: Fair                             ADL either performed or assessed with clinical judgement   ADL Overall ADL's : Needs assistance/impaired Eating/Feeding: Modified independent    Grooming: Standing;Minimal assistance   Upper Body Bathing: Set up;Minimal assistance   Lower Body Bathing: Set up;Maximal assistance   Upper Body Dressing : Set up;Minimal assistance   Lower Body Dressing: Set up;Maximal assistance   Toilet Transfer: Minimal assistance   Toileting- Clothing Manipulation and Hygiene: Minimal assistance         General ADL Comments: Full ADL performed this date as above with bathing, dressing, grooming and toileting.      Vision       Perception     Praxis      Cognition Arousal/Alertness: Awake/alert Behavior During Therapy: WFL for tasks assessed/performed Overall Cognitive Status: Within Functional Limits for tasks assessed                                          Exercises     Shoulder Instructions       General Comments      Pertinent Vitals/ Pain       Pain Assessment: 0-10 Pain Score: 5  Pain Location: left hip Pain Descriptors / Indicators: Aching;Operative site guarding;Sore Pain Intervention(s): Limited activity within patient's tolerance;Repositioned;Monitored during session  Home Living  Prior Functioning/Environment              Frequency  Min 1X/week        Progress Toward Goals  OT Goals(current goals can now be found in the care plan section)  Progress towards OT goals: Progressing toward goals  Acute Rehab OT Goals Patient Stated Goal: Patient would like to be able to take care of herself and eventually go home.  She reports she realizes she will have to go to STR. OT Goal Formulation: With patient Time For Goal Achievement: 02/23/17 Potential to Achieve Goals: Good  Plan Discharge plan remains appropriate    Co-evaluation                 AM-PAC PT "6 Clicks" Daily Activity     Outcome Measure   Help from another person eating meals?: None Help from another person taking care of personal grooming?: A  Little Help from another person toileting, which includes using toliet, bedpan, or urinal?: A Lot Help from another person bathing (including washing, rinsing, drying)?: A Lot Help from another person to put on and taking off regular upper body clothing?: A Little Help from another person to put on and taking off regular lower body clothing?: A Lot 6 Click Score: 16    End of Session Equipment Utilized During Treatment: Gait belt;Rolling walker  OT Visit Diagnosis: Muscle weakness (generalized) (M62.81);Unsteadiness on feet (R26.81);Pain Pain - Right/Left: Left Pain - part of body: Hip   Activity Tolerance Patient limited by pain   Patient Left with call bell/phone within reach;in chair;with chair alarm set   Nurse Communication Other (comment) (self care status to aide)        Time: 1100-1147 OT Time Calculation (min): 47 min  Charges: OT General Charges $OT Visit: 1 Procedure OT Treatments $Self Care/Home Management : 38-52 mins  Trason Shifflet T Levetta Bognar, OTR/L, CLT    Michalina Calbert 02/14/2017, 11:51 AM

## 2017-02-17 DIAGNOSIS — E785 Hyperlipidemia, unspecified: Secondary | ICD-10-CM | POA: Diagnosis not present

## 2017-02-17 DIAGNOSIS — I10 Essential (primary) hypertension: Secondary | ICD-10-CM | POA: Diagnosis not present

## 2017-02-17 DIAGNOSIS — S72142D Displaced intertrochanteric fracture of left femur, subsequent encounter for closed fracture with routine healing: Secondary | ICD-10-CM | POA: Diagnosis not present

## 2017-02-17 DIAGNOSIS — M6281 Muscle weakness (generalized): Secondary | ICD-10-CM | POA: Diagnosis not present

## 2017-02-26 DIAGNOSIS — M25552 Pain in left hip: Secondary | ICD-10-CM | POA: Diagnosis not present

## 2017-02-28 DIAGNOSIS — I10 Essential (primary) hypertension: Secondary | ICD-10-CM | POA: Diagnosis not present

## 2017-02-28 DIAGNOSIS — E785 Hyperlipidemia, unspecified: Secondary | ICD-10-CM | POA: Diagnosis not present

## 2017-02-28 DIAGNOSIS — S72142D Displaced intertrochanteric fracture of left femur, subsequent encounter for closed fracture with routine healing: Secondary | ICD-10-CM | POA: Diagnosis not present

## 2017-02-28 DIAGNOSIS — M6281 Muscle weakness (generalized): Secondary | ICD-10-CM | POA: Diagnosis not present

## 2017-03-07 ENCOUNTER — Other Ambulatory Visit: Payer: Self-pay | Admitting: *Deleted

## 2017-03-07 NOTE — Patient Outreach (Signed)
[facility]) Care Management  03/07/2017  Kellie Harris Jun 26, 1931 [id number]   Met with patient at facility. Patient reports she is going home tomorrow. Her son is coming into town to assist her this weekend.  She states up until this fall, she was independent and living alone.  Patient reports her daughter lives in a nearby town and will be assisting her with transportation until she is cleared to start driving again.   RNCM reviewed [facility] care management and gave [facility] information. Patient appreciative of information.   Met with Kellie Harris, SW at facility, he reports patient will have [facility] home care for therapy, no nursing needs noted.   Plan to sign off.  Kellie Harris. Kellie Purser, RN, BSN, [place] [phone]) Business Cell  [phone]) Toll Free Office

## 2017-03-12 DIAGNOSIS — M1991 Primary osteoarthritis, unspecified site: Secondary | ICD-10-CM | POA: Diagnosis not present

## 2017-03-12 DIAGNOSIS — I10 Essential (primary) hypertension: Secondary | ICD-10-CM | POA: Diagnosis not present

## 2017-03-12 DIAGNOSIS — S72002D Fracture of unspecified part of neck of left femur, subsequent encounter for closed fracture with routine healing: Secondary | ICD-10-CM | POA: Diagnosis not present

## 2017-03-12 DIAGNOSIS — M8580 Other specified disorders of bone density and structure, unspecified site: Secondary | ICD-10-CM | POA: Diagnosis not present

## 2017-03-12 DIAGNOSIS — K5790 Diverticulosis of intestine, part unspecified, without perforation or abscess without bleeding: Secondary | ICD-10-CM | POA: Diagnosis not present

## 2017-03-15 DIAGNOSIS — I10 Essential (primary) hypertension: Secondary | ICD-10-CM | POA: Diagnosis not present

## 2017-03-15 DIAGNOSIS — M1991 Primary osteoarthritis, unspecified site: Secondary | ICD-10-CM | POA: Diagnosis not present

## 2017-03-15 DIAGNOSIS — S72002D Fracture of unspecified part of neck of left femur, subsequent encounter for closed fracture with routine healing: Secondary | ICD-10-CM | POA: Diagnosis not present

## 2017-03-15 DIAGNOSIS — M8580 Other specified disorders of bone density and structure, unspecified site: Secondary | ICD-10-CM | POA: Diagnosis not present

## 2017-03-15 DIAGNOSIS — K5790 Diverticulosis of intestine, part unspecified, without perforation or abscess without bleeding: Secondary | ICD-10-CM | POA: Diagnosis not present

## 2017-03-26 DIAGNOSIS — M25561 Pain in right knee: Secondary | ICD-10-CM | POA: Diagnosis not present

## 2017-03-26 DIAGNOSIS — M25552 Pain in left hip: Secondary | ICD-10-CM | POA: Diagnosis not present

## 2017-03-26 DIAGNOSIS — I10 Essential (primary) hypertension: Secondary | ICD-10-CM | POA: Diagnosis not present

## 2017-03-26 DIAGNOSIS — K5900 Constipation, unspecified: Secondary | ICD-10-CM | POA: Diagnosis not present

## 2017-03-26 DIAGNOSIS — M1991 Primary osteoarthritis, unspecified site: Secondary | ICD-10-CM | POA: Diagnosis not present

## 2017-03-26 DIAGNOSIS — M8580 Other specified disorders of bone density and structure, unspecified site: Secondary | ICD-10-CM | POA: Diagnosis not present

## 2017-03-26 DIAGNOSIS — K5790 Diverticulosis of intestine, part unspecified, without perforation or abscess without bleeding: Secondary | ICD-10-CM | POA: Diagnosis not present

## 2017-04-02 DIAGNOSIS — Z8781 Personal history of (healed) traumatic fracture: Secondary | ICD-10-CM | POA: Diagnosis not present

## 2017-04-11 DIAGNOSIS — R2689 Other abnormalities of gait and mobility: Secondary | ICD-10-CM | POA: Diagnosis not present

## 2017-04-11 DIAGNOSIS — M25552 Pain in left hip: Secondary | ICD-10-CM | POA: Diagnosis not present

## 2017-04-16 DIAGNOSIS — R2689 Other abnormalities of gait and mobility: Secondary | ICD-10-CM | POA: Diagnosis not present

## 2017-04-16 DIAGNOSIS — M25552 Pain in left hip: Secondary | ICD-10-CM | POA: Diagnosis not present

## 2017-04-17 DIAGNOSIS — R0609 Other forms of dyspnea: Secondary | ICD-10-CM | POA: Diagnosis not present

## 2017-04-17 DIAGNOSIS — R42 Dizziness and giddiness: Secondary | ICD-10-CM | POA: Diagnosis not present

## 2017-04-17 DIAGNOSIS — E785 Hyperlipidemia, unspecified: Secondary | ICD-10-CM | POA: Diagnosis not present

## 2017-04-17 DIAGNOSIS — I10 Essential (primary) hypertension: Secondary | ICD-10-CM | POA: Diagnosis not present

## 2017-04-22 DIAGNOSIS — M19012 Primary osteoarthritis, left shoulder: Secondary | ICD-10-CM | POA: Diagnosis not present

## 2017-04-24 DIAGNOSIS — R2689 Other abnormalities of gait and mobility: Secondary | ICD-10-CM | POA: Diagnosis not present

## 2017-04-24 DIAGNOSIS — M25552 Pain in left hip: Secondary | ICD-10-CM | POA: Diagnosis not present

## 2017-04-25 DIAGNOSIS — M25552 Pain in left hip: Secondary | ICD-10-CM | POA: Diagnosis not present

## 2017-04-25 DIAGNOSIS — R2689 Other abnormalities of gait and mobility: Secondary | ICD-10-CM | POA: Diagnosis not present

## 2017-04-29 DIAGNOSIS — H43813 Vitreous degeneration, bilateral: Secondary | ICD-10-CM | POA: Diagnosis not present

## 2017-05-02 DIAGNOSIS — R2689 Other abnormalities of gait and mobility: Secondary | ICD-10-CM | POA: Diagnosis not present

## 2017-05-02 DIAGNOSIS — M25552 Pain in left hip: Secondary | ICD-10-CM | POA: Diagnosis not present

## 2017-05-09 DIAGNOSIS — M25552 Pain in left hip: Secondary | ICD-10-CM | POA: Diagnosis not present

## 2017-05-09 DIAGNOSIS — R2689 Other abnormalities of gait and mobility: Secondary | ICD-10-CM | POA: Diagnosis not present

## 2017-05-13 ENCOUNTER — Other Ambulatory Visit: Payer: Self-pay | Admitting: Family Medicine

## 2017-05-13 DIAGNOSIS — Z1231 Encounter for screening mammogram for malignant neoplasm of breast: Secondary | ICD-10-CM

## 2017-05-14 DIAGNOSIS — R2689 Other abnormalities of gait and mobility: Secondary | ICD-10-CM | POA: Diagnosis not present

## 2017-05-14 DIAGNOSIS — M25552 Pain in left hip: Secondary | ICD-10-CM | POA: Diagnosis not present

## 2017-05-21 DIAGNOSIS — M25552 Pain in left hip: Secondary | ICD-10-CM | POA: Diagnosis not present

## 2017-05-21 DIAGNOSIS — R2689 Other abnormalities of gait and mobility: Secondary | ICD-10-CM | POA: Diagnosis not present

## 2017-05-28 DIAGNOSIS — R2689 Other abnormalities of gait and mobility: Secondary | ICD-10-CM | POA: Diagnosis not present

## 2017-05-28 DIAGNOSIS — M25552 Pain in left hip: Secondary | ICD-10-CM | POA: Diagnosis not present

## 2017-05-31 DIAGNOSIS — M25552 Pain in left hip: Secondary | ICD-10-CM | POA: Diagnosis not present

## 2017-05-31 DIAGNOSIS — R2689 Other abnormalities of gait and mobility: Secondary | ICD-10-CM | POA: Diagnosis not present

## 2017-06-04 DIAGNOSIS — R2689 Other abnormalities of gait and mobility: Secondary | ICD-10-CM | POA: Diagnosis not present

## 2017-06-04 DIAGNOSIS — M25552 Pain in left hip: Secondary | ICD-10-CM | POA: Diagnosis not present

## 2017-06-06 DIAGNOSIS — R2689 Other abnormalities of gait and mobility: Secondary | ICD-10-CM | POA: Diagnosis not present

## 2017-06-06 DIAGNOSIS — M25552 Pain in left hip: Secondary | ICD-10-CM | POA: Diagnosis not present

## 2017-06-18 DIAGNOSIS — M25552 Pain in left hip: Secondary | ICD-10-CM | POA: Diagnosis not present

## 2017-06-18 DIAGNOSIS — R2689 Other abnormalities of gait and mobility: Secondary | ICD-10-CM | POA: Diagnosis not present

## 2017-06-26 DIAGNOSIS — M1711 Unilateral primary osteoarthritis, right knee: Secondary | ICD-10-CM | POA: Diagnosis not present

## 2017-07-02 DIAGNOSIS — E782 Mixed hyperlipidemia: Secondary | ICD-10-CM | POA: Diagnosis not present

## 2017-07-02 DIAGNOSIS — Z0000 Encounter for general adult medical examination without abnormal findings: Secondary | ICD-10-CM | POA: Diagnosis not present

## 2017-07-02 DIAGNOSIS — I10 Essential (primary) hypertension: Secondary | ICD-10-CM | POA: Diagnosis not present

## 2017-07-12 DIAGNOSIS — E782 Mixed hyperlipidemia: Secondary | ICD-10-CM | POA: Diagnosis not present

## 2017-07-12 DIAGNOSIS — I10 Essential (primary) hypertension: Secondary | ICD-10-CM | POA: Diagnosis not present

## 2017-07-12 DIAGNOSIS — Z0000 Encounter for general adult medical examination without abnormal findings: Secondary | ICD-10-CM | POA: Diagnosis not present

## 2017-07-18 DIAGNOSIS — R42 Dizziness and giddiness: Secondary | ICD-10-CM | POA: Diagnosis not present

## 2017-07-18 DIAGNOSIS — R0609 Other forms of dyspnea: Secondary | ICD-10-CM | POA: Diagnosis not present

## 2017-07-18 DIAGNOSIS — I10 Essential (primary) hypertension: Secondary | ICD-10-CM | POA: Diagnosis not present

## 2017-07-18 DIAGNOSIS — E782 Mixed hyperlipidemia: Secondary | ICD-10-CM | POA: Diagnosis not present

## 2017-07-23 ENCOUNTER — Other Ambulatory Visit: Payer: Self-pay | Admitting: Specialist

## 2017-07-24 ENCOUNTER — Encounter
Admission: RE | Admit: 2017-07-24 | Discharge: 2017-07-24 | Disposition: A | Discharge status: Home / Self Care | Payer: PPO | Source: Ambulatory Visit | Attending: Specialist | Admitting: Specialist

## 2017-07-24 ENCOUNTER — Other Ambulatory Visit: Payer: Self-pay

## 2017-07-24 DIAGNOSIS — Z01812 Encounter for preprocedural laboratory examination: Secondary | ICD-10-CM | POA: Diagnosis not present

## 2017-07-24 DIAGNOSIS — I4510 Unspecified right bundle-branch block: Secondary | ICD-10-CM | POA: Diagnosis not present

## 2017-07-24 DIAGNOSIS — Z01810 Encounter for preprocedural cardiovascular examination: Secondary | ICD-10-CM | POA: Insufficient documentation

## 2017-07-24 DIAGNOSIS — I10 Essential (primary) hypertension: Secondary | ICD-10-CM | POA: Diagnosis not present

## 2017-07-24 LAB — COMPREHENSIVE METABOLIC PANEL
ALBUMIN: 4.4 g/dL (ref 3.5–5.0)
ALT: 12 U/L — AB (ref 14–54)
AST: 20 U/L (ref 15–41)
Alkaline Phosphatase: 89 U/L (ref 38–126)
Anion gap: 9 (ref 5–15)
BUN: 12 mg/dL (ref 6–20)
CHLORIDE: 101 mmol/L (ref 101–111)
CO2: 25 mmol/L (ref 22–32)
Calcium: 10.8 mg/dL — ABNORMAL HIGH (ref 8.9–10.3)
Creatinine, Ser: 0.86 mg/dL (ref 0.44–1.00)
GFR calc Af Amer: >60 mL/min (ref >60)
GFR calc non Af Amer: >60 mL/min (ref >60)
Glucose, Bld: 101 mg/dL — ABNORMAL HIGH (ref 65–99)
POTASSIUM: 4 mmol/L (ref 3.5–5.1)
SODIUM: 135 mmol/L (ref 135–145)
Total Bilirubin: 0.8 mg/dL (ref 0.3–1.2)
Total Protein: 7.5 g/dL (ref 6.5–8.1)

## 2017-07-24 LAB — CBC WITH DIFFERENTIAL/PLATELET
BASOS ABS: 0.1 10*3/uL (ref 0–0.1)
BASOS PCT: 1 %
EOS ABS: 0.2 10*3/uL (ref 0–0.7)
EOS PCT: 3 %
HCT: 40 % (ref 35.0–47.0)
Hemoglobin: 13.2 g/dL (ref 12.0–16.0)
Lymphocytes Relative: 16 %
Lymphs Abs: 1.4 10*3/uL (ref 1.0–3.6)
MCH: 28.6 pg (ref 26.0–34.0)
MCHC: 32.9 g/dL (ref 32.0–36.0)
MCV: 86.9 fL (ref 80.0–100.0)
MONO ABS: 0.5 10*3/uL (ref 0.2–0.9)
Monocytes Relative: 6 %
Neutro Abs: 6.4 10*3/uL (ref 1.4–6.5)
Neutrophils Relative %: 74 %
PLATELETS: 298 10*3/uL (ref 150–440)
RBC: 4.61 MIL/uL (ref 3.80–5.20)
RDW: 16.4 % — AB (ref 11.5–14.5)
WBC: 8.5 10*3/uL (ref 3.6–11.0)

## 2017-07-24 LAB — TYPE AND SCREEN
ABO/RH(D): O POS
ANTIBODY SCREEN: NEGATIVE

## 2017-07-24 LAB — URINALYSIS, ROUTINE W REFLEX MICROSCOPIC
BILIRUBIN URINE: NEGATIVE
GLUCOSE, UA: NEGATIVE mg/dL
HGB URINE DIPSTICK: NEGATIVE
Ketones, ur: NEGATIVE mg/dL
Leukocytes, UA: NEGATIVE
Nitrite: NEGATIVE
PROTEIN: NEGATIVE mg/dL
SPECIFIC GRAVITY, URINE: 1.005 (ref 1.005–1.030)
pH: 6 (ref 5.0–8.0)

## 2017-07-24 LAB — HEMOGLOBIN A1C
HEMOGLOBIN A1C: 4.8 % (ref 4.8–5.6)
Mean Plasma Glucose: 91.06 mg/dL

## 2017-07-24 LAB — PROTIME-INR
INR: 0.93
Prothrombin Time: 12.4 seconds (ref 11.4–15.2)

## 2017-07-24 LAB — APTT: APTT: 32 s (ref 24–36)

## 2017-07-24 LAB — SURGICAL PCR SCREEN
MRSA, PCR: NEGATIVE
Staphylococcus aureus: NEGATIVE

## 2017-07-24 NOTE — Anesthesia Preprocedure Evaluation (Addendum)
Anesthesia Evaluation  Patient identified by MRN, date of birth, ID band Patient awake    Reviewed: Allergy & Precautions, H&P , NPO status , Patient's Chart, lab work & pertinent test results, reviewed documented beta blocker date and time   Airway Mallampati: III   Neck ROM: full    Dental  (+) Poor Dentition   Pulmonary neg pulmonary ROS,    Pulmonary exam normal        Cardiovascular Exercise Tolerance: Poor hypertension, On Medications negative cardio ROS Normal cardiovascular exam Rhythm:regular Rate:Normal     Neuro/Psych CVA, No Residual Symptoms negative neurological ROS  negative psych ROS   GI/Hepatic negative GI ROS, Neg liver ROS, hiatal hernia,   Endo/Other  negative endocrine ROS  Renal/GU Renal diseasenegative Renal ROS  negative genitourinary   Musculoskeletal   Abdominal   Peds  Hematology negative hematology ROS (+)   Anesthesia Other Findings Past Medical History: No date: Arthritis     Comment:  right knee, left shoulder/ OSTEO No date: Cancer ([place])     Comment:  skin  No date: Degenerative disc disease, lumbar     Comment:  L4-L5 No date: Diverticulosis     Comment:  ITIS No date: History of hiatal hernia No date: Hydronephrosis     Comment:  right No date: Hypercholesteremia No date: Hypertension     Comment:  CONTROLLED ON MEDS No date: IBS (irritable bowel syndrome) 03/05/2015: Knee fracture, right     Comment:  DIFFICULTY WITH AMBULATION No date: Osteopenia No date: Ovarian cyst No date: Pancreatic insufficiency No date: Wears hearing aid     Comment:  bilateral Past Surgical History: 11/02/2015: CATARACT EXTRACTION W/PHACO; Right     Comment:  Procedure: CATARACT EXTRACTION PHACO AND INTRAOCULAR               LENS PLACEMENT (IOC);  Surgeon: Leandrew Koyanagi, MD;               Location: [place];  Service: Ophthalmology;                Laterality:  Right; 07/18/2016: CATARACT EXTRACTION W/PHACO; Left     Comment:  Procedure: CATARACT EXTRACTION PHACO AND INTRAOCULAR               LENS PLACEMENT (IOC);  Surgeon: Leandrew Koyanagi, MD;               Location: [place];  Service: Ophthalmology;                Laterality: Left; No date: COLONOSCOPY No date: FRACTURE SURGERY; Left     Comment:  hip 02/12/2017: INTRAMEDULLARY (IM) NAIL INTERTROCHANTERIC; Left     Comment:  Procedure: INTRAMEDULLARY (IM) NAIL INTERTROCHANTRIC;                Surgeon: Earnestine Leys, MD;  Location: [facility];                Service: Orthopedics;  Laterality: Left; No date: kidney stent 2015: OOPHORECTOMY; Bilateral No date: TONSILLECTOMY BMI    Body Mass Index:  20.20 kg/m     Reproductive/Obstetrics negative OB ROS                            Anesthesia Physical Anesthesia Plan  ASA: III  Anesthesia Plan: Spinal   Post-op Pain Management:    Induction:   PONV Risk Score and Plan: 3  Airway Management  Planned:   Additional Equipment:   Intra-op Plan:   Post-operative Plan:   Informed Consent: I have reviewed the patients History and Physical, chart, labs and discussed the procedure including the risks, benefits and alternatives for the proposed anesthesia with the patient or authorized representative who has indicated his/her understanding and acceptance.   Dental Advisory Given  Plan Discussed with: CRNA  Anesthesia Plan Comments:         Anesthesia Quick Evaluation

## 2017-07-24 NOTE — Patient Instructions (Signed)
Your procedure is scheduled on: Wednesday 08/07/17 Report to [facility]. 2ND FLOOR MEDICAL MALL ENTRANCE. To find out your arrival time please call [phone] between 1PM - 3PM on Monday 08/05/17.  Remember: Instructions that are not followed completely may result in serious medical risk, up to and including death, or upon the discretion of your surgeon and anesthesiologist your surgery may need to be rescheduled.    __X__ 1. Do not eat anything after midnight the night before your    procedure.  No gum chewing or hard candies.  You may drink clear   liquids up to 2 hours before you are scheduled to arrive at the   hospital for your procedure. Do not drink clear liquids within 2   hours of scheduled arrival to the hospital as this may lead to your   procedure being delayed or rescheduled.       Clear liquids include:   Water or Apple juice without pulp   Clear carbohydrate beverage such as Clearfast or Gatorade   Black coffee or Clear Tea (no milk, no creamer, do not add anything   to the coffee or tea)    Diabetics should only drink water   __X__ 2. No Alcohol for 24 hours before or after surgery.   ____ 3. Bring all medications with you on the day of surgery if instructed.    __X__ 4. Notify your doctor if there is any change in your medical condition     (cold, fever, infections).             __X___5. No smoking within 24 hours of your surgery.     Do not wear jewelry, make-up, hairpins, clips or nail polish.  Do not wear lotions, powders, or perfumes.   Do not shave 48 hours prior to surgery. Men may shave face and neck.  Do not bring valuables to the hospital.    [facility] is not responsible for any belongings or valuables.               Contacts, dentures or bridgework may not be worn into surgery.  Leave your suitcase in the car. After surgery it may be brought to your room.  For patients admitted to the hospital, discharge time is determined by your                 treatment team.   Patients discharged the day of surgery will not be allowed to drive home.   Please read over the following fact sheets that you were given:   MRSA Information   __X__ Take these medicines the morning of surgery with A SIP OF WATER:    1. AMLODIPINE  2.   3.   4.  5.  6.  ____ Fleet Enema (as directed)   __X__ Use CHG Soap/SAGE wipes as directed  ____ Use inhalers on the day of surgery  ____ Stop metformin 2 days prior to surgery    ____ Take 1/2 of usual insulin dose the night before surgery and none on the morning of surgery.   __X__ Stop Coumadin/Plavix/aspirin on AS INSTRUCTED  __X__ Stop Anti-inflammatories such as Advil, Aleve, Ibuprofen, Motrin, Naproxen, Naprosyn, Goodies,powder, or aspirin products.  OK to take Tylenol.   __X__ Stop supplements, Vitamin E, Fish Oil until after surgery.    ____ Bring C-Pap to the hospital.

## 2017-07-25 DIAGNOSIS — M1711 Unilateral primary osteoarthritis, right knee: Secondary | ICD-10-CM | POA: Diagnosis not present

## 2017-07-25 NOTE — Pre-Procedure Instructions (Signed)
EKG FAXED TO DR PARASCHOS. OFFICE NOTE FROM PARASCHOS 07/18/17 INDICATING NEED FOR NEURO EVAL CALLED AND FAXED TO Kellie Harris AT DR [facility]/ THEY WILL NEED TO DETERMINS IF SETUP AND IF NOT ARRANGE

## 2017-07-29 DIAGNOSIS — R42 Dizziness and giddiness: Secondary | ICD-10-CM | POA: Diagnosis not present

## 2017-07-30 DIAGNOSIS — M25561 Pain in right knee: Secondary | ICD-10-CM | POA: Diagnosis not present

## 2017-07-30 DIAGNOSIS — R29898 Other symptoms and signs involving the musculoskeletal system: Secondary | ICD-10-CM | POA: Diagnosis not present

## 2017-08-01 NOTE — Pre-Procedure Instructions (Signed)
Kellie Harris, [employer] - 07/18/2017 2:00 PM EST Formatting of this note might be different from the original. Established Patient Visit   Chief Complaint: Chief Complaint  Patient presents with  . Follow-up  per pt dizzy over thanksgiving  . Dizziness  1x  Date of Service: 07/18/2017 Date of Birth: 02-19-1931 PCP: Dion Body, MD  History of Present Illness: Kellie Harris is a 83 y.o.female patient who returns today for evaluation of recent episode of lightheadedness. The patient has a history of hypertension, hyperlipidemia, and recurrent lightheadedness. The patient reports that this episode of lightheadedness occurred on Thanksgiving morning while she was standing up in her kitchen making coffee. She felt dizzy and lightheaded without associated shortness of breath, palpitations, chest pain, or diaphoresis. She was able to walk the 20 feet to her room and lay down. She rested for 15 minutes and was able to resume her normal activities with no residual deficits. She checked her blood pressure and pulse, which were both within normal limits. She denies unilateral extremity weakness, slurred speech, memory changes, or unilateral facial drooping. The patient had a similar episode earlier this year in March, as well as 2 years prior. She wore a 30-day event monitor 10/22/16, which revealed predominant normal sinus rhythm with intermittent sinus bradycardia with heart rates around 35 bpm, with 1 brief atrial runs lasting less than 5 seconds, not correlating with diary entries. Bilateral carotid ultrasound on 3/28/8 revealed insignificant stenosis. The patient denies syncope. She denies experiencing palpitations or heart racing. She denies chest pain. She has mild exertional shortness of breath. She denies peripheral edema. The patient underwent stress echocardiogram 09/10/2014 and exercise 6 minutes on a Bruce protocol without chest pain, ECG changes or echocardiographic evidence for ischemia. Left  ventricular function was normal with LV ejection fraction of >55%.  The patient has essential hypertension, systolic blood pressure mildly elevated today, currently on amlodipine and lisinopril, which are well-tolerated without apparent side effects. The patient follows a low sodium, no added salt diet.  The patient has hyperlipidemia, LDL cholesterol 95 on 01/02/17, currently on pravastatin, which is well tolerated without apparent side effects. The patient follows a low cholesterol diet.  Past Medical and Surgical History  Past Medical History Past Medical History:  Diagnosis Date  . Diverticulitis  . Diverticulosis  . H/O fracture of femur  Hx of fx of right femur condyle  . Hemorrhoid  . Hemorrhoids  . History of osteopenia  (last dexa 12/2010 - nl)  . Hyperlipidemia, unspecified  (LDL 83 - 10/2013)  . Hypertension  . OA (osteoarthritis)   Past Surgical History She has a past surgical history that includes Robotic BSO, 08/2013; Right ureter stent placement (12/2013); Right ureter stent removal (04/2014); Mohs procedure; Colonoscopy (2002); Cataract extraction (Right); and Left hip surgery s/p fracture (02/12/2017).   Medications and Allergies  Current Medications  Current Outpatient Medications  Medication Sig Dispense Refill  . acetaminophen (TYLENOL) 500 MG tablet Take 500 mg by mouth once daily Will take once daily and will occasionally take twice daily  . amLODIPine (NORVASC) 5 MG tablet Take 1 tablet (5 mg total) by mouth once daily. 90 tablet 1  . aspirin 81 MG EC tablet Take 81 mg by mouth once daily.  [name] Kitchen lisinopril (PRINIVIL,ZESTRIL) 20 MG tablet Take 1 tablet (20 mg total) by mouth 2 (two) times daily. 180 tablet 1  . meloxicam (MOBIC) 15 MG tablet Take 15 mg by mouth once daily as needed  . MULTIVITAMIN ORAL Take  1 tablet by mouth once daily.  . pravastatin (PRAVACHOL) 40 MG tablet Take 0.5 tablets (20 mg total) by mouth nightly 45 tablet 1   No current  facility-administered medications for this visit.   Allergies: Patient has no known allergies.  Social and Family History  Social History reports that she has never smoked. She has never used smokeless tobacco. She reports that she does not drink alcohol or use drugs.  Family History Family History  Problem Relation Age of Onset  . Stroke Mother  . Breast cancer Mother  . Leukemia Mother  . Stroke Father  . Inflammatory bowel disease Daughter  . Colon cancer Neg Hx  . Colon polyps Neg Hx   Review of Systems   Review of Systems: The patient denies chest pain, with mild exertional shortness of breath, without orthopnea, paroxysmal nocturnal dyspnea, pedal edema, palpitations, with intermittent lightheadedness, presyncope, without syncope. Review of 12 Systems is negative except as described above.  Physical Examination   Vitals:BP 142/70  Pulse 84  Ht 149.9 cm (4\' 11" )  Wt 48.2 kg (106 lb 3.2 oz)  BMI 21.45 kg/m  Ht:149.9 cm (4\' 11" ) Wt:48.2 kg (106 lb 3.2 oz) AOZ:HYQM surface area is 1.42 meters squared. Body mass index is 21.45 kg/m.  General: Alert and oriented. Anxious-appearing. No acute distress. HEENT: Pupils equally reactive to light and accomodation  Neck: Supple Lungs: Normal effort of breathing; clear to auscultation bilaterally; no wheezes, rales, rhonchi Heart: Regular rate and regular rhythm. No murmur, rub, or gallop Abdomen: nondistended, with normal bowel sounds Extremities: no cyanosis, clubbing, or edema Peripheral Pulses: 2+ radial bilaterally Skin: Warm, Gaspard, no diaphoresis  Assessment   83 y.o. female with  1. Mixed hyperlipidemia (Trig 264, LDL 63 - 07/02/17)  2. Exertional dyspnea  3. Essential hypertension  4. Lightheadedness   83 year old female with recurrent episodes of lightheadedness without syncope. 30 day event monitor revealed predominant normal sinus rhythm with intermittent sinus bradycardia with heart rates around 55 bpm, with one  brief atrial run lasting less than 5 seconds, not correlating with diary entries.Bilateral carotid ultrasound on 3/28/8 revealed insignificant stenosis. Patient has essential hypertension, systolic blood pressure mildly elevated today on current BP medications. Patient has hypercholesterolemia with adequate control of LDL cholesterol on pravastatin.   Plan   1. Continue current medications. 2. Counseled patient about low sodium diet. 3. DASH diet printed instructions given to the patient. 4. Counseled patient about low cholesterol diet. 5. Continue pravastatin for hyperlipidemia management. 6. Low cholesterol diet printed instructions given to the patient. 7. Neurology referral for recurrent lightheadedness and negative thorough cardiac work-up 8. Discussed implantable Loop monitor as a possible option; she wished to defer at this time. 9. Return to clinic for follow-up in about 4 months.  No orders of the defined types were placed in this encounter.  Return in about 3 months (around 10/16/2017). I personally performed the service, non-incident to. (WP)  ANNA Shirlee More, PA-C    Electronically Signed by Kellie Decamp, PA on 07/18/2017 2:00 PM EST

## 2017-08-06 MED ORDER — CEFAZOLIN SODIUM-DEXTROSE 2-4 GM/100ML-% IV SOLN
2.0000 g | INTRAVENOUS | Status: AC
  Administered 2017-08-07: 2 g via INTRAVENOUS

## 2017-08-06 MED ORDER — VANCOMYCIN HCL IN DEXTROSE 1-5 GM/200ML-% IV SOLN
1000.0000 mg | INTRAVENOUS | Status: AC
  Administered 2017-08-07: 1000 mg via INTRAVENOUS

## 2017-08-06 MED ORDER — SODIUM CHLORIDE 0.9 % IV SOLN
1000.0000 mg | INTRAVENOUS | Status: AC
  Filled 2017-08-06: qty 10

## 2017-08-07 ENCOUNTER — Encounter
Admission: RE | Disposition: A | Discharge status: Skilled Nursing Facility | Payer: Self-pay | Source: Ambulatory Visit | Attending: Specialist

## 2017-08-07 ENCOUNTER — Inpatient Hospital Stay: Payer: PPO | Admitting: Anesthesiology

## 2017-08-07 ENCOUNTER — Other Ambulatory Visit: Payer: Self-pay

## 2017-08-07 ENCOUNTER — Inpatient Hospital Stay
Admission: RE | Admit: 2017-08-07 | Discharge: 2017-08-09 | DRG: 470 | Disposition: A | Discharge status: Skilled Nursing Facility | Payer: PPO | Source: Ambulatory Visit | Attending: Specialist | Admitting: Specialist

## 2017-08-07 ENCOUNTER — Encounter: Payer: Self-pay | Admitting: Emergency Medicine

## 2017-08-07 ENCOUNTER — Inpatient Hospital Stay: Payer: PPO

## 2017-08-07 DIAGNOSIS — M19012 Primary osteoarthritis, left shoulder: Secondary | ICD-10-CM | POA: Diagnosis present

## 2017-08-07 DIAGNOSIS — S72143D Displaced intertrochanteric fracture of unspecified femur, subsequent encounter for closed fracture with routine healing: Secondary | ICD-10-CM | POA: Diagnosis not present

## 2017-08-07 DIAGNOSIS — Z9049 Acquired absence of other specified parts of digestive tract: Secondary | ICD-10-CM | POA: Diagnosis not present

## 2017-08-07 DIAGNOSIS — Z9841 Cataract extraction status, right eye: Secondary | ICD-10-CM

## 2017-08-07 DIAGNOSIS — Z961 Presence of intraocular lens: Secondary | ICD-10-CM | POA: Diagnosis present

## 2017-08-07 DIAGNOSIS — Z8249 Family history of ischemic heart disease and other diseases of the circulatory system: Secondary | ICD-10-CM

## 2017-08-07 DIAGNOSIS — Z471 Aftercare following joint replacement surgery: Secondary | ICD-10-CM | POA: Diagnosis not present

## 2017-08-07 DIAGNOSIS — R2689 Other abnormalities of gait and mobility: Secondary | ICD-10-CM | POA: Diagnosis not present

## 2017-08-07 DIAGNOSIS — K449 Diaphragmatic hernia without obstruction or gangrene: Secondary | ICD-10-CM | POA: Diagnosis present

## 2017-08-07 DIAGNOSIS — M5136 Other intervertebral disc degeneration, lumbar region: Secondary | ICD-10-CM | POA: Diagnosis not present

## 2017-08-07 DIAGNOSIS — M25561 Pain in right knee: Secondary | ICD-10-CM | POA: Diagnosis not present

## 2017-08-07 DIAGNOSIS — K8689 Other specified diseases of pancreas: Secondary | ICD-10-CM | POA: Diagnosis not present

## 2017-08-07 DIAGNOSIS — M21061 Valgus deformity, not elsewhere classified, right knee: Secondary | ICD-10-CM | POA: Diagnosis not present

## 2017-08-07 DIAGNOSIS — M1711 Unilateral primary osteoarthritis, right knee: Principal | ICD-10-CM | POA: Diagnosis present

## 2017-08-07 DIAGNOSIS — E569 Vitamin deficiency, unspecified: Secondary | ICD-10-CM | POA: Diagnosis not present

## 2017-08-07 DIAGNOSIS — E7800 Pure hypercholesterolemia, unspecified: Secondary | ICD-10-CM | POA: Diagnosis present

## 2017-08-07 DIAGNOSIS — Z96651 Presence of right artificial knee joint: Secondary | ICD-10-CM | POA: Diagnosis not present

## 2017-08-07 DIAGNOSIS — Z90722 Acquired absence of ovaries, bilateral: Secondary | ICD-10-CM

## 2017-08-07 DIAGNOSIS — I10 Essential (primary) hypertension: Secondary | ICD-10-CM | POA: Diagnosis not present

## 2017-08-07 DIAGNOSIS — N131 Hydronephrosis with ureteral stricture, not elsewhere classified: Secondary | ICD-10-CM | POA: Diagnosis present

## 2017-08-07 DIAGNOSIS — Z823 Family history of stroke: Secondary | ICD-10-CM

## 2017-08-07 DIAGNOSIS — Z7982 Long term (current) use of aspirin: Secondary | ICD-10-CM | POA: Diagnosis not present

## 2017-08-07 DIAGNOSIS — M810 Age-related osteoporosis without current pathological fracture: Secondary | ICD-10-CM | POA: Diagnosis present

## 2017-08-07 DIAGNOSIS — M62561 Muscle wasting and atrophy, not elsewhere classified, right lower leg: Secondary | ICD-10-CM | POA: Diagnosis not present

## 2017-08-07 DIAGNOSIS — Z96659 Presence of unspecified artificial knee joint: Secondary | ICD-10-CM

## 2017-08-07 DIAGNOSIS — M6281 Muscle weakness (generalized): Secondary | ICD-10-CM | POA: Diagnosis not present

## 2017-08-07 DIAGNOSIS — K0889 Other specified disorders of teeth and supporting structures: Secondary | ICD-10-CM | POA: Diagnosis not present

## 2017-08-07 DIAGNOSIS — K5900 Constipation, unspecified: Secondary | ICD-10-CM | POA: Diagnosis not present

## 2017-08-07 DIAGNOSIS — Z967 Presence of other bone and tendon implants: Secondary | ICD-10-CM | POA: Diagnosis present

## 2017-08-07 DIAGNOSIS — Z8673 Personal history of transient ischemic attack (TIA), and cerebral infarction without residual deficits: Secondary | ICD-10-CM

## 2017-08-07 DIAGNOSIS — K5790 Diverticulosis of intestine, part unspecified, without perforation or abscess without bleeding: Secondary | ICD-10-CM | POA: Diagnosis not present

## 2017-08-07 DIAGNOSIS — Z8781 Personal history of (healed) traumatic fracture: Secondary | ICD-10-CM

## 2017-08-07 DIAGNOSIS — Z974 Presence of external hearing-aid: Secondary | ICD-10-CM

## 2017-08-07 DIAGNOSIS — Z9842 Cataract extraction status, left eye: Secondary | ICD-10-CM | POA: Diagnosis not present

## 2017-08-07 DIAGNOSIS — Z79899 Other long term (current) drug therapy: Secondary | ICD-10-CM

## 2017-08-07 DIAGNOSIS — Z960 Presence of urogenital implants: Secondary | ICD-10-CM | POA: Diagnosis present

## 2017-08-07 DIAGNOSIS — E785 Hyperlipidemia, unspecified: Secondary | ICD-10-CM | POA: Diagnosis not present

## 2017-08-07 DIAGNOSIS — G8918 Other acute postprocedural pain: Secondary | ICD-10-CM | POA: Diagnosis not present

## 2017-08-07 HISTORY — PX: TOTAL KNEE ARTHROPLASTY: SHX125

## 2017-08-07 LAB — CBC
HEMATOCRIT: 36.1 % (ref 35.0–47.0)
Hemoglobin: 12 g/dL (ref 12.0–16.0)
MCH: 29.1 pg (ref 26.0–34.0)
MCHC: 33.2 g/dL (ref 32.0–36.0)
MCV: 87.8 fL (ref 80.0–100.0)
PLATELETS: 221 10*3/uL (ref 150–440)
RBC: 4.11 MIL/uL (ref 3.80–5.20)
RDW: 16 % — AB (ref 11.5–14.5)
WBC: 7.2 10*3/uL (ref 3.6–11.0)

## 2017-08-07 LAB — ABO/RH: ABO/RH(D): O POS

## 2017-08-07 LAB — CREATININE, SERUM: Creatinine, Ser: 0.71 mg/dL (ref 0.44–1.00)

## 2017-08-07 SURGERY — ARTHROPLASTY, KNEE, TOTAL
Anesthesia: Spinal | Site: Knee | Laterality: Right | Wound class: Clean

## 2017-08-07 MED ORDER — ACETAMINOPHEN 650 MG RE SUPP: 650.0000 mg | RECTAL | Status: DC | PRN

## 2017-08-07 MED ORDER — METOCLOPRAMIDE HCL 5 MG/ML IJ SOLN: 5.0000 mg | Freq: Three times a day (TID) | INTRAMUSCULAR | Status: DC | PRN

## 2017-08-07 MED ORDER — ADULT MULTIVITAMIN W/MINERALS CH
1.0000 | ORAL_TABLET | Freq: Every day | ORAL | Status: DC
  Administered 2017-08-07 – 2017-08-09 (×3): 1 via ORAL
  Filled 2017-08-07 (×3): qty 1

## 2017-08-07 MED ORDER — EPINEPHRINE PF 1 MG/ML IJ SOLN
INTRAMUSCULAR | Status: DC | PRN
  Administered 2017-08-07: .0001 mL via INTRATHECAL

## 2017-08-07 MED ORDER — METOCLOPRAMIDE HCL 10 MG PO TABS: 5.0000 mg | ORAL_TABLET | Freq: Three times a day (TID) | ORAL | Status: DC | PRN

## 2017-08-07 MED ORDER — BUPIVACAINE LIPOSOME 1.3 % IJ SUSP
INTRAMUSCULAR | Status: AC
  Filled 2017-08-07: qty 20

## 2017-08-07 MED ORDER — BUPIVACAINE HCL (PF) 0.25 % IJ SOLN
INTRAMUSCULAR | Status: AC
  Filled 2017-08-07: qty 30

## 2017-08-07 MED ORDER — PROPOFOL 500 MG/50ML IV EMUL
INTRAVENOUS | Status: DC | PRN
  Administered 2017-08-07: 25 ug/kg/min via INTRAVENOUS

## 2017-08-07 MED ORDER — KETOROLAC TROMETHAMINE 30 MG/ML IJ SOLN
INTRAMUSCULAR | Status: AC
  Filled 2017-08-07: qty 1

## 2017-08-07 MED ORDER — CELECOXIB 200 MG PO CAPS
200.0000 mg | ORAL_CAPSULE | ORAL | Status: AC
  Administered 2017-08-07: 200 mg via ORAL

## 2017-08-07 MED ORDER — ENOXAPARIN SODIUM 30 MG/0.3ML ~~LOC~~ SOLN
30.0000 mg | SUBCUTANEOUS | Status: DC
  Administered 2017-08-08 – 2017-08-09 (×2): 30 mg via SUBCUTANEOUS
  Filled 2017-08-07 (×2): qty 0.3

## 2017-08-07 MED ORDER — PRAVASTATIN SODIUM 20 MG PO TABS
20.0000 mg | ORAL_TABLET | Freq: Every day | ORAL | Status: DC
  Administered 2017-08-07 – 2017-08-08 (×2): 20 mg via ORAL
  Filled 2017-08-07 (×2): qty 1

## 2017-08-07 MED ORDER — NEOMYCIN-POLYMYXIN B GU 40-200000 IR SOLN
Status: DC | PRN
  Administered 2017-08-07: 16 mL

## 2017-08-07 MED ORDER — SODIUM CHLORIDE 0.9 % IV SOLN
1000.0000 mg | Freq: Once | INTRAVENOUS | Status: AC
  Administered 2017-08-07: 1000 mg via INTRAVENOUS
  Filled 2017-08-07: qty 10

## 2017-08-07 MED ORDER — PROPOFOL 10 MG/ML IV BOLUS
INTRAVENOUS | Status: AC
  Filled 2017-08-07: qty 20

## 2017-08-07 MED ORDER — PHENOL 1.4 % MT LIQD
1.0000 | OROMUCOSAL | Status: DC | PRN
  Filled 2017-08-07: qty 177

## 2017-08-07 MED ORDER — VANCOMYCIN HCL IN DEXTROSE 1-5 GM/200ML-% IV SOLN: 1000.0000 mg | Freq: Two times a day (BID) | INTRAVENOUS | Status: DC

## 2017-08-07 MED ORDER — GABAPENTIN 300 MG PO CAPS
300.0000 mg | ORAL_CAPSULE | Freq: Three times a day (TID) | ORAL | Status: DC
  Administered 2017-08-07 – 2017-08-09 (×6): 300 mg via ORAL
  Filled 2017-08-07 (×6): qty 1

## 2017-08-07 MED ORDER — MIDAZOLAM HCL 5 MG/5ML IJ SOLN
INTRAMUSCULAR | Status: DC | PRN
  Administered 2017-08-07 (×2): 1 mg via INTRAVENOUS

## 2017-08-07 MED ORDER — LIDOCAINE HCL (PF) 2 % IJ SOLN
INTRAMUSCULAR | Status: DC | PRN
  Administered 2017-08-07: 50 mg

## 2017-08-07 MED ORDER — PHENYLEPHRINE HCL 10 MG/ML IJ SOLN
INTRAMUSCULAR | Status: AC
  Filled 2017-08-07: qty 1

## 2017-08-07 MED ORDER — MORPHINE SULFATE (PF) 2 MG/ML IV SOLN: 1.0000 mg | INTRAVENOUS | Status: DC | PRN

## 2017-08-07 MED ORDER — CHLORHEXIDINE GLUCONATE CLOTH 2 % EX PADS: 6.0000 | MEDICATED_PAD | Freq: Once | CUTANEOUS | Status: DC

## 2017-08-07 MED ORDER — HYDROCODONE-ACETAMINOPHEN 5-325 MG PO TABS: 1.0000 | ORAL_TABLET | ORAL | Status: DC | PRN

## 2017-08-07 MED ORDER — CELECOXIB 200 MG PO CAPS
200.0000 mg | ORAL_CAPSULE | Freq: Two times a day (BID) | ORAL | Status: DC
  Administered 2017-08-07 – 2017-08-09 (×5): 200 mg via ORAL
  Filled 2017-08-07 (×5): qty 1

## 2017-08-07 MED ORDER — CEFAZOLIN SODIUM-DEXTROSE 2-4 GM/100ML-% IV SOLN
INTRAVENOUS | Status: AC
  Filled 2017-08-07: qty 100

## 2017-08-07 MED ORDER — FAMOTIDINE 20 MG PO TABS
20.0000 mg | ORAL_TABLET | Freq: Once | ORAL | Status: AC
  Administered 2017-08-07: 20 mg via ORAL

## 2017-08-07 MED ORDER — MIDAZOLAM HCL 2 MG/2ML IJ SOLN
INTRAMUSCULAR | Status: AC
  Filled 2017-08-07: qty 2

## 2017-08-07 MED ORDER — ONDANSETRON HCL 4 MG/2ML IJ SOLN: 4.0000 mg | Freq: Once | INTRAMUSCULAR | Status: DC | PRN

## 2017-08-07 MED ORDER — METHOCARBAMOL 500 MG PO TABS
500.0000 mg | ORAL_TABLET | Freq: Four times a day (QID) | ORAL | Status: DC | PRN
  Administered 2017-08-07: 500 mg via ORAL
  Filled 2017-08-07: qty 1

## 2017-08-07 MED ORDER — KETOROLAC TROMETHAMINE 30 MG/ML IJ SOLN
INTRAMUSCULAR | Status: DC | PRN
  Administered 2017-08-07: 30 mg via INTRAVENOUS

## 2017-08-07 MED ORDER — RISAQUAD PO CAPS
1.0000 | ORAL_CAPSULE | Freq: Every day | ORAL | Status: DC
  Administered 2017-08-07 – 2017-08-09 (×3): 1 via ORAL
  Filled 2017-08-07 (×3): qty 1

## 2017-08-07 MED ORDER — BISACODYL 10 MG RE SUPP: 10.0000 mg | Freq: Every day | RECTAL | Status: DC | PRN

## 2017-08-07 MED ORDER — DIPHENHYDRAMINE HCL 12.5 MG/5ML PO ELIX: 12.5000 mg | ORAL_SOLUTION | ORAL | Status: DC | PRN

## 2017-08-07 MED ORDER — VANCOMYCIN HCL IN DEXTROSE 1-5 GM/200ML-% IV SOLN
INTRAVENOUS | Status: AC
  Administered 2017-08-07: 1000 mg via INTRAVENOUS
  Filled 2017-08-07: qty 200

## 2017-08-07 MED ORDER — CELECOXIB 200 MG PO CAPS
ORAL_CAPSULE | ORAL | Status: AC
  Administered 2017-08-07: 200 mg via ORAL
  Filled 2017-08-07: qty 1

## 2017-08-07 MED ORDER — FERROUS FUMARATE 324 (106 FE) MG PO TABS
ORAL_TABLET | Freq: Two times a day (BID) | ORAL | Status: DC
  Administered 2017-08-07 – 2017-08-09 (×4): 106 mg via ORAL
  Filled 2017-08-07 (×5): qty 1

## 2017-08-07 MED ORDER — LISINOPRIL 20 MG PO TABS
20.0000 mg | ORAL_TABLET | Freq: Two times a day (BID) | ORAL | Status: DC
  Administered 2017-08-07 – 2017-08-09 (×3): 20 mg via ORAL
  Filled 2017-08-07 (×4): qty 1

## 2017-08-07 MED ORDER — BUPIVACAINE HCL (PF) 0.25 % IJ SOLN
INTRAMUSCULAR | Status: DC | PRN
  Administered 2017-08-07: 30 mL

## 2017-08-07 MED ORDER — TRANEXAMIC ACID 1000 MG/10ML IV SOLN
INTRAVENOUS | Status: DC | PRN
  Administered 2017-08-07: 1000 mg via INTRAVENOUS

## 2017-08-07 MED ORDER — LACTATED RINGERS IV SOLN
INTRAVENOUS | Status: DC
  Administered 2017-08-07 (×2): via INTRAVENOUS

## 2017-08-07 MED ORDER — MAGNESIUM HYDROXIDE 400 MG/5ML PO SUSP
30.0000 mL | Freq: Every day | ORAL | Status: DC | PRN
  Administered 2017-08-07: 30 mL via ORAL
  Filled 2017-08-07: qty 30

## 2017-08-07 MED ORDER — VANCOMYCIN HCL IN DEXTROSE 750-5 MG/150ML-% IV SOLN
750.0000 mg | INTRAVENOUS | Status: AC
  Administered 2017-08-08 – 2017-08-09 (×2): 750 mg via INTRAVENOUS
  Filled 2017-08-07 (×2): qty 150

## 2017-08-07 MED ORDER — FENTANYL CITRATE (PF) 100 MCG/2ML IJ SOLN: 25.0000 ug | INTRAMUSCULAR | Status: DC | PRN

## 2017-08-07 MED ORDER — ALUM & MAG HYDROXIDE-SIMETH 200-200-20 MG/5ML PO SUSP: 30.0000 mL | ORAL | Status: DC | PRN

## 2017-08-07 MED ORDER — FAMOTIDINE 20 MG PO TABS
ORAL_TABLET | ORAL | Status: AC
  Administered 2017-08-07: 20 mg via ORAL
  Filled 2017-08-07: qty 1

## 2017-08-07 MED ORDER — ONDANSETRON HCL 4 MG/2ML IJ SOLN: 4.0000 mg | Freq: Four times a day (QID) | INTRAMUSCULAR | Status: DC | PRN

## 2017-08-07 MED ORDER — EPINEPHRINE PF 1 MG/ML IJ SOLN
INTRAMUSCULAR | Status: AC
  Filled 2017-08-07: qty 1

## 2017-08-07 MED ORDER — MENTHOL 3 MG MT LOZG
1.0000 | LOZENGE | OROMUCOSAL | Status: DC | PRN
  Filled 2017-08-07: qty 9

## 2017-08-07 MED ORDER — BUPIVACAINE HCL (PF) 0.5 % IJ SOLN
INTRAMUSCULAR | Status: AC
  Filled 2017-08-07: qty 10

## 2017-08-07 MED ORDER — METHOCARBAMOL 1000 MG/10ML IJ SOLN
500.0000 mg | Freq: Four times a day (QID) | INTRAVENOUS | Status: DC | PRN
  Filled 2017-08-07: qty 5

## 2017-08-07 MED ORDER — ONDANSETRON HCL 4 MG PO TABS: 4.0000 mg | ORAL_TABLET | Freq: Four times a day (QID) | ORAL | Status: DC | PRN

## 2017-08-07 MED ORDER — NEOMYCIN-POLYMYXIN B GU 40-200000 IR SOLN
Status: AC
  Filled 2017-08-07: qty 20

## 2017-08-07 MED ORDER — MORPHINE SULFATE (PF) 4 MG/ML IV SOLN
INTRAVENOUS | Status: DC | PRN
  Administered 2017-08-07: 4 mg via INTRAVENOUS

## 2017-08-07 MED ORDER — ZOLPIDEM TARTRATE 5 MG PO TABS: 5.0000 mg | ORAL_TABLET | Freq: Every evening | ORAL | Status: DC | PRN

## 2017-08-07 MED ORDER — SENNA 8.6 MG PO TABS
1.0000 | ORAL_TABLET | Freq: Two times a day (BID) | ORAL | Status: DC
  Administered 2017-08-07: 8.6 mg via ORAL
  Filled 2017-08-07 (×5): qty 1

## 2017-08-07 MED ORDER — PROPOFOL 10 MG/ML IV BOLUS
INTRAVENOUS | Status: DC | PRN
  Administered 2017-08-07 (×2): 20 mg via INTRAVENOUS

## 2017-08-07 MED ORDER — FLEET ENEMA 7-19 GM/118ML RE ENEM: 1.0000 | ENEMA | Freq: Once | RECTAL | Status: DC | PRN

## 2017-08-07 MED ORDER — HYDROCODONE-ACETAMINOPHEN 7.5-325 MG PO TABS
1.0000 | ORAL_TABLET | ORAL | Status: DC | PRN
  Administered 2017-08-08: 1 via ORAL
  Filled 2017-08-07: qty 1

## 2017-08-07 MED ORDER — SODIUM CHLORIDE 0.9 % IJ SOLN
INTRAMUSCULAR | Status: DC | PRN
  Administered 2017-08-07: 30 mL via INTRAVENOUS

## 2017-08-07 MED ORDER — ACETAMINOPHEN 325 MG PO TABS
650.0000 mg | ORAL_TABLET | ORAL | Status: DC | PRN
  Administered 2017-08-08: 650 mg via ORAL
  Filled 2017-08-07: qty 2

## 2017-08-07 MED ORDER — MORPHINE SULFATE (PF) 4 MG/ML IV SOLN
INTRAVENOUS | Status: AC
  Filled 2017-08-07: qty 1

## 2017-08-07 MED ORDER — BUPIVACAINE HCL (PF) 0.5 % IJ SOLN
INTRAMUSCULAR | Status: DC | PRN
  Administered 2017-08-07: 3 mL via INTRATHECAL

## 2017-08-07 MED ORDER — AMLODIPINE BESYLATE 5 MG PO TABS
5.0000 mg | ORAL_TABLET | Freq: Every day | ORAL | Status: DC
  Administered 2017-08-09: 5 mg via ORAL
  Filled 2017-08-07 (×2): qty 1

## 2017-08-07 MED ORDER — LIDOCAINE HCL (PF) 2 % IJ SOLN
INTRAMUSCULAR | Status: AC
  Filled 2017-08-07: qty 10

## 2017-08-07 MED ORDER — GABAPENTIN 400 MG PO CAPS
ORAL_CAPSULE | ORAL | Status: AC
  Administered 2017-08-07: 400 mg via ORAL
  Filled 2017-08-07: qty 1

## 2017-08-07 MED ORDER — GABAPENTIN 400 MG PO CAPS
400.0000 mg | ORAL_CAPSULE | ORAL | Status: AC
  Administered 2017-08-07: 400 mg via ORAL

## 2017-08-07 MED ORDER — SODIUM CHLORIDE 0.9 % IJ SOLN
INTRAMUSCULAR | Status: AC
  Filled 2017-08-07: qty 100

## 2017-08-07 MED ORDER — BUPIVACAINE LIPOSOME 1.3 % IJ SUSP
INTRAMUSCULAR | Status: DC | PRN
  Administered 2017-08-07: 60 mL

## 2017-08-07 MED ORDER — SODIUM CHLORIDE 0.45 % IV SOLN
INTRAVENOUS | Status: DC
  Administered 2017-08-07 (×2): via INTRAVENOUS

## 2017-08-07 SURGICAL SUPPLY — 52 items
AUTOTRANSFUS HAS 1/8 (MISCELLANEOUS) ×3
BLADE DEBAKEY 8.0 (BLADE) ×4 IMPLANT
BLADE DEBAKEY 8.0MM (BLADE) ×2
BLADE SAGITTAL WIDE XTHICK NO (BLADE) ×3 IMPLANT
CANISTER SUCT 1200ML W/VALVE (MISCELLANEOUS) ×3 IMPLANT
CANISTER SUCT 3000ML PPV (MISCELLANEOUS) ×3 IMPLANT
CAP KNEE TOTAL 3 SIGMA ×3 IMPLANT
CEMENT HV SMART SET (Cement) ×6 IMPLANT
CHLORAPREP W/TINT 26ML (MISCELLANEOUS) ×6 IMPLANT
COOLER POLAR GLACIER W/PUMP (MISCELLANEOUS) ×3 IMPLANT
CUFF TOURN 24 STER (MISCELLANEOUS) ×3 IMPLANT
CUFF TOURN 30 STER DUAL PORT (MISCELLANEOUS) IMPLANT
DRAPE INCISE IOBAN 66X60 STRL (DRAPES) ×3 IMPLANT
DRAPE SHEET LG 3/4 BI-LAMINATE (DRAPES) ×6 IMPLANT
DRSG AQUACEL AG ADV 3.5X10 (GAUZE/BANDAGES/DRESSINGS) ×3 IMPLANT
DRSG AQUACEL AG ADV 3.5X14 (GAUZE/BANDAGES/DRESSINGS) IMPLANT
ELECT REM PT RETURN 9FT ADLT (ELECTROSURGICAL) ×3
ELECTRODE REM PT RTRN 9FT ADLT (ELECTROSURGICAL) ×1 IMPLANT
GLOVE BIO SURGEON STRL SZ7.5 (GLOVE) ×3 IMPLANT
GLOVE BIO SURGEON STRL SZ8 (GLOVE) ×3 IMPLANT
GLOVE BIOGEL PI IND STRL 8.5 (GLOVE) ×1 IMPLANT
GLOVE BIOGEL PI INDICATOR 8.5 (GLOVE) ×2
GLOVE INDICATOR 8.0 STRL GRN (GLOVE) ×3 IMPLANT
GLOVE SURG ORTHO 8.5 STRL (GLOVE) ×3 IMPLANT
GOWN STRL REUS W/ TWL LRG LVL4 (GOWN DISPOSABLE) ×1 IMPLANT
GOWN STRL REUS W/TWL LRG LVL4 (GOWN DISPOSABLE) ×5 IMPLANT
IMMBOLIZER KNEE 19 BLUE UNIV (SOFTGOODS) ×3 IMPLANT
KIT RM TURNOVER STRD PROC AR (KITS) ×3 IMPLANT
NEEDLE SPNL 20GX3.5 QUINCKE YW (NEEDLE) ×3 IMPLANT
NS IRRIG 1000ML POUR BTL (IV SOLUTION) ×3 IMPLANT
PACK TOTAL KNEE (MISCELLANEOUS) ×3 IMPLANT
PAD WRAPON POLAR KNEE (MISCELLANEOUS) ×1 IMPLANT
PULSAVAC PLUS IRRIG FAN TIP (DISPOSABLE) ×3
SOL .9 NS 3000ML IRR  AL (IV SOLUTION) ×2
SOL .9 NS 3000ML IRR UROMATIC (IV SOLUTION) ×1 IMPLANT
SPONGE DRAIN TRACH 4X4 STRL 2S (GAUZE/BANDAGES/DRESSINGS) ×3 IMPLANT
SPONGE LAP 18X18 5 PK (GAUZE/BANDAGES/DRESSINGS) IMPLANT
STAPLER SKIN PROX 35W (STAPLE) ×3 IMPLANT
SUCTION FRAZIER HANDLE 10FR (MISCELLANEOUS) ×2
SUCTION TUBE FRAZIER 10FR DISP (MISCELLANEOUS) ×1 IMPLANT
SUT BONE WAX W31G (SUTURE) ×3 IMPLANT
SUT DVC 2 QUILL PDO  T11 36X36 (SUTURE) ×2
SUT DVC 2 QUILL PDO T11 36X36 (SUTURE) ×1 IMPLANT
SUT QUILL PDO 0 36 36 VIOLET (SUTURE) ×3 IMPLANT
SYR 20CC LL (SYRINGE) ×9 IMPLANT
SYSTEM AUTOTRANSFUS DUAL TROCR (MISCELLANEOUS) ×1 IMPLANT
TAPE MICROFOAM 4IN (TAPE) ×3 IMPLANT
TIP FAN IRRIG PULSAVAC PLUS (DISPOSABLE) ×1 IMPLANT
TOWER CARTRIDGE SMART MIX (DISPOSABLE) ×3 IMPLANT
TRAY FOLEY W/METER SILVER 16FR (SET/KITS/TRAYS/PACK) ×3 IMPLANT
TUBE SUCT KAM VAC (TUBING) ×3 IMPLANT
WRAPON POLAR PAD KNEE (MISCELLANEOUS) ×3

## 2017-08-07 NOTE — Transfer of Care (Signed)
Immediate Anesthesia Transfer of Care Note  Patient: Kellie Harris  Procedure(s) Performed: TOTAL KNEE ARTHROPLASTY (Right Knee)  Patient Location: PACU  Anesthesia Type:Spinal  Level of Consciousness: awake, alert , oriented and patient cooperative  Airway & Oxygen Therapy: Patient Spontanous Breathing and Patient connected to nasal cannula oxygen  Post-op Assessment: Report given to RN and Post -op Vital signs reviewed and stable  Post vital signs: Reviewed and stable  Last Vitals:  Vitals:   08/07/17 0626  BP: (!) 157/82  Pulse: 91  Resp: 17  Temp: 36.8 C  SpO2: 97%    Last Pain:  Vitals:   08/07/17 0626  TempSrc: Tympanic         Complications: No apparent anesthesia complications

## 2017-08-07 NOTE — Evaluation (Signed)
Physical Therapy Evaluation Patient Details Name: Kellie Harris MRN: [id number] DOB: 1930/12/15 Today's Date: 08/07/2017   History of Present Illness  Pt is a 83 y/o F who s/p R TKA.  Pt's PMH includes osteopenia, R knee fracture, cancer, L hip IM nailing 02/12/2017.    Clinical Impression  Pt is s/p R TKA resulting in the deficits listed below (see PT Problem List). Ms. Glab was independent PTA, ambualting with SPC in community and no AD in home.  She currently requires min guard assist for transfers and ambulation. She lives alone with only intermittent assist available from her daughter as her daughter lives out of town.  Given pt's current mobility status, recommending SNF at d/c.  Pt will benefit from skilled PT to increase their independence and safety with mobility to allow discharge to the venue listed below.     Follow Up Recommendations SNF    Equipment Recommendations  None recommended by PT    Recommendations for Other Services       Precautions / Restrictions Precautions Precautions: Fall Required Braces or Orthoses: Knee Immobilizer - Right Knee Immobilizer - Right: On when out of bed or walking Restrictions Weight Bearing Restrictions: Yes RLE Weight Bearing: Partial weight bearing RLE Partial Weight Bearing Percentage or Pounds: 50      Mobility  Bed Mobility Overal bed mobility: Needs Assistance Bed Mobility: Supine to Sit     Supine to sit: Supervision;HOB elevated     General bed mobility comments: Supervision for safety.  Increased time.  No physical assist required.  Transfers Overall transfer level: Needs assistance Equipment used: Rolling walker (2 wheeled) Transfers: Sit to/from Stand Sit to Stand: Min guard         General transfer comment: Cues for proper hand placement and safe technique.   Ambulation/Gait Ambulation/Gait assistance: Min guard Ambulation Distance (Feet): 70 Feet Assistive device: Rolling walker (2 wheeled) Gait  Pattern/deviations: Decreased stance time - right;Decreased step length - left;Decreased weight shift to right;Antalgic;Trunk flexed Gait velocity: decreased Gait velocity interpretation: Below normal speed for age/gender General Gait Details: Cues for proper sequencing using RW and for upright posture.    Stairs            Wheelchair Mobility    Modified Rankin (Stroke Patients Only)       Balance Overall balance assessment: Needs assistance;History of Falls Sitting-balance support: No upper extremity supported;Feet supported Sitting balance-Leahy Scale: Good     Standing balance support: No upper extremity supported;During functional activity Standing balance-Leahy Scale: Fair Standing balance comment: Pt able to stand statically without UE support but relies on UE support for dynamic activities.                              Pertinent Vitals/Pain Pain Assessment: No/denies pain    Home Living Family/patient expects to be discharged to:: Skilled nursing facility Living Arrangements: Alone Available Help at Discharge: Family;Available PRN/intermittently Type of Home: House Home Access: Stairs to enter Entrance Stairs-Rails: (grab bar on the L when entering) Entrance Stairs-Number of Steps: 1 Home Layout: One level Home Equipment: Cane - single point;Shower seat;Walker - 2 wheels;Walker - 4 wheels      Prior Function Level of Independence: Independent with assistive device(s)         Comments: Pt ambulates with SPC in community and no AD in her home.  Pt independent with all ADL/IADs.  Pt had a fall in July 2018  with surgery to repair L hip.     Hand Dominance   Dominant Hand: Right    Extremity/Trunk Assessment   Upper Extremity Assessment Upper Extremity Assessment: Defer to OT evaluation    Lower Extremity Assessment Lower Extremity Assessment: RLE deficits/detail RLE Deficits / Details: Unable to formally assess RLE but functionally pt  able to perform SLR        Communication   Communication: HOH  Cognition Arousal/Alertness: Awake/alert Behavior During Therapy: WFL for tasks assessed/performed Overall Cognitive Status: Within Functional Limits for tasks assessed                                        General Comments General comments (skin integrity, edema, etc.): Daughter present during evaluation.     Exercises Total Joint Exercises Ankle Circles/Pumps: AROM;Both;10 reps;Supine Quad Sets: Strengthening;Both;10 reps;Supine Heel Slides: Strengthening;AROM;Right;10 reps;Supine Hip ABduction/ADduction: Strengthening;Right;10 reps;Supine Straight Leg Raises: Strengthening;Right;10 reps;Supine Goniometric ROM: 8-100 deg   Assessment/Plan    PT Assessment Patient needs continued PT services  PT Problem List Decreased strength;Decreased range of motion;Decreased activity tolerance;Decreased balance;Decreased mobility;Decreased knowledge of use of DME;Decreased safety awareness;Decreased knowledge of precautions;Pain       PT Treatment Interventions DME instruction;Gait training;Stair training;Functional mobility training;Therapeutic activities;Therapeutic exercise;Balance training;Neuromuscular re-education;Patient/family education;Modalities    PT Goals (Current goals can be found in the Care Plan section)  Acute Rehab PT Goals Patient Stated Goal: to get stronger and become more independent PT Goal Formulation: With patient Time For Goal Achievement: 08/21/17 Potential to Achieve Goals: Good    Frequency BID   Barriers to discharge Decreased caregiver support Lives alone and daughter is only assist available who is only available intermittently (lives in [place])    Co-evaluation               AM-PAC PT "6 Clicks" Daily Activity  Outcome Measure Difficulty turning over in bed (including adjusting bedclothes, sheets and blankets)?: Unable Difficulty moving from lying on back  to sitting on the side of the bed? : A Little Difficulty sitting down on and standing up from a chair with arms (e.g., wheelchair, bedside commode, etc,.)?: A Little Help needed moving to and from a bed to chair (including a wheelchair)?: A Little Help needed walking in hospital room?: A Little Help needed climbing 3-5 steps with a railing? : A Little 6 Click Score: 16    End of Session Equipment Utilized During Treatment: Gait belt;Right knee immobilizer Activity Tolerance: Patient tolerated treatment well Patient left: in chair;with call bell/phone within reach;with chair alarm set;with family/visitor present;Other (comment)(with towel roll and polar care in place) Nurse Communication: Mobility status PT Visit Diagnosis: Pain;Unsteadiness on feet (R26.81);Other abnormalities of gait and mobility (R26.89);Muscle weakness (generalized) (M62.81);History of falling (Z91.81) Pain - Right/Left: Right Pain - part of body: Knee    Time: 0102-7253 PT Time Calculation (min) (ACUTE ONLY): 47 min   Charges:   PT Evaluation $PT Eval Low Complexity: 1 Low PT Treatments $Gait Training: 8-22 mins $Therapeutic Exercise: 8-22 mins   PT G Codes:   PT G-Codes **NOT FOR INPATIENT CLASS** Functional Assessment Tool Used: AM-PAC 6 Clicks Basic Mobility;Clinical judgement Functional Limitation: Mobility: Walking and moving around Mobility: Walking and Moving Around Current Status (G6440): At least 40 percent but less than 60 percent impaired, limited or restricted Mobility: Walking and Moving Around Goal Status [phone]): At least 1 percent but less than 20 percent  impaired, limited or restricted    Collie Siad PT, DPT 08/07/2017, 4:32 PM

## 2017-08-07 NOTE — H&P (Signed)
THE PATIENT WAS SEEN PRIOR TO SURGERY TODAY.  HISTORY, ALLERGIES, HOME MEDICATIONS AND OPERATIVE PROCEDURE WERE REVIEWED. RISKS AND BENEFITS OF SURGERY DISCUSSED WITH PATIENT AGAIN.  NO CHANGES FROM INITIAL HISTORY AND PHYSICAL NOTED.    

## 2017-08-07 NOTE — Progress Notes (Signed)
Patient hemovac out on 1 side and bleeding. MD notified Mack Guise. Ordered to remove hemovac.

## 2017-08-07 NOTE — Anesthesia Post-op Follow-up Note (Signed)
Anesthesia QCDR form completed.        

## 2017-08-07 NOTE — NC FL2 (Signed)
[place] LEVEL OF CARE SCREENING TOOL     IDENTIFICATION  Patient Name: Kellie Harris Birthdate: 10/05/1930 Sex: female Admission Date (Current Location): 08/07/2017  [place] and [place] Number:  Engineering geologist and Address:  [facility], [address] [place], [place] [address]      Provider Number: [id number]  Attending Physician Name and Address:  Earnestine Leys, MD  Relative Name and Phone Number:       Current Level of Care: Hospital Recommended Level of Care: [place] Prior Approval Number:    Date Approved/Denied:   PASRR Number: [id number] )  Discharge Plan: SNF    Current Diagnoses: Patient Active Problem List   Diagnosis Date Noted  . Total knee replacement status 08/07/2017  . Closed left hip fracture ([place]) 02/12/2017  . HTN (hypertension) 02/12/2017  . HLD (hyperlipidemia) 02/12/2017    Orientation RESPIRATION BLADDER Height & Weight     Self, Time, Situation, Place  Normal Continent Weight: 100 lb (45.4 kg) Height:  4\' 11"  (149.9 cm)  BEHAVIORAL SYMPTOMS/MOOD NEUROLOGICAL BOWEL NUTRITION STATUS      Continent Diet(Regular Diet. )  AMBULATORY STATUS COMMUNICATION OF NEEDS Skin   Extensive Assist Verbally Surgical wounds(Incision: Right Knee. )                       Personal Care Assistance Level of Assistance  Bathing, Feeding, Dressing Bathing Assistance: Limited assistance Feeding assistance: Independent Dressing Assistance: Limited assistance     Functional Limitations Info  Sight, Hearing, Speech Sight Info: Adequate Hearing Info: Adequate Speech Info: Adequate    SPECIAL CARE FACTORS FREQUENCY  PT (By licensed PT), OT (By licensed OT)     PT Frequency: (5) OT Frequency: (5)            Contractures      Additional Factors Info  Code Status, Allergies Code Status Info: (Full Code. ) Allergies Info: (No Known Allergies. )           Current Medications  (08/07/2017):  This is the current hospital active medication list Current Facility-Administered Medications  Medication Dose Route Frequency Provider Last Rate Last Dose  . 0.45 % sodium chloride infusion   Intravenous Continuous Earnestine Leys, MD 75 mL/hr at 08/07/17 1247    . acetaminophen (TYLENOL) tablet 650 mg  650 mg Oral Q4H PRN Earnestine Leys, MD       Or  . acetaminophen (TYLENOL) suppository 650 mg  650 mg Rectal Q4H PRN Earnestine Leys, MD      . acidophilus (RISAQUAD) capsule 1 capsule  1 capsule Oral Daily Earnestine Leys, MD   1 capsule at 08/07/17 1254  . alum & mag hydroxide-simeth (MAALOX/MYLANTA) 200-200-20 MG/5ML suspension 30 mL  30 mL Oral Q4H PRN Earnestine Leys, MD      . Derrill Memo ON 08/08/2017] amLODipine (NORVASC) tablet 5 mg  5 mg Oral Daily Earnestine Leys, MD      . bisacodyl (DULCOLAX) suppository 10 mg  10 mg Rectal Daily PRN Earnestine Leys, MD      . celecoxib (CELEBREX) capsule 200 mg  200 mg Oral Q12H Earnestine Leys, MD   200 mg at 08/07/17 1254  . diphenhydrAMINE (BENADRYL) 12.5 MG/5ML elixir 12.5-25 mg  12.5-25 mg Oral Q4H PRN Earnestine Leys, MD      . Derrill Memo ON 08/08/2017] enoxaparin (LOVENOX) injection 30 mg  30 mg Subcutaneous Q24H Earnestine Leys, MD      . Ferrous Fumarate (  HEMOCYTE - 106 mg FE) tablet   Oral BID Earnestine Leys, MD      . gabapentin (NEURONTIN) capsule 300 mg  300 mg Oral TID Earnestine Leys, MD   300 mg at 08/07/17 1538  . HYDROcodone-acetaminophen (NORCO) 7.5-325 MG per tablet 1 tablet  1 tablet Oral Q4H PRN Earnestine Leys, MD      . lisinopril (PRINIVIL,ZESTRIL) tablet 20 mg  20 mg Oral BID Earnestine Leys, MD      . magnesium hydroxide (MILK OF MAGNESIA) suspension 30 mL  30 mL Oral Daily PRN Earnestine Leys, MD   30 mL at 08/07/17 1252  . menthol-cetylpyridinium (CEPACOL) lozenge 3 mg  1 lozenge Oral PRN Earnestine Leys, MD       Or  . phenol (CHLORASEPTIC) mouth spray 1 spray  1 spray Mouth/Throat PRN Earnestine Leys, MD      . methocarbamol  (ROBAXIN) tablet 500 mg  500 mg Oral Q6H PRN Earnestine Leys, MD   500 mg at 08/07/17 1254   Or  . methocarbamol (ROBAXIN) 500 mg in dextrose 5 % 50 mL IVPB  500 mg Intravenous Q6H PRN Earnestine Leys, MD      . metoCLOPramide (REGLAN) tablet 5-10 mg  5-10 mg Oral Q8H PRN Earnestine Leys, MD       Or  . metoCLOPramide (REGLAN) injection 5-10 mg  5-10 mg Intravenous Q8H PRN Earnestine Leys, MD      . morphine 2 MG/ML injection 1 mg  1 mg Intravenous Q2H PRN Earnestine Leys, MD      . multivitamin with minerals tablet 1 tablet  1 tablet Oral Daily Earnestine Leys, MD   1 tablet at 08/07/17 1254  . ondansetron (ZOFRAN) tablet 4 mg  4 mg Oral Q6H PRN Earnestine Leys, MD       Or  . ondansetron [facility]) injection 4 mg  4 mg Intravenous Q6H PRN Earnestine Leys, MD      . pravastatin (PRAVACHOL) tablet 20 mg  20 mg Oral Tora Duck, MD      . senna [facility]) tablet 8.6 mg  1 tablet Oral BID Earnestine Leys, MD   8.6 mg at 08/07/17 1254  . sodium phosphate (FLEET) 7-19 GM/118ML enema 1 enema  1 enema Rectal Once PRN Earnestine Leys, MD      . Derrill Memo ON 08/08/2017] vancomycin (VANCOCIN) IVPB 750 mg/150 ml premix  750 mg Intravenous Q24H Earnestine Leys, MD      . zolpidem Lorrin Mais) tablet 5 mg  5 mg Oral QHS PRN Earnestine Leys, MD         Discharge Medications: Please see discharge summary for a list of discharge medications.  Relevant Imaging Results:  Relevant Lab Results:   Additional Information (SSN: [id number])  Kaleeah Gingerich, Veronia Beets, LCSW

## 2017-08-07 NOTE — Anesthesia Procedure Notes (Signed)
Spinal  Patient location during procedure: OR Staffing Anesthesiologist: Gunnar Bulla, MD Resident/CRNA: Rolla Plate, CRNA Performed: resident/CRNA  Preanesthetic Checklist Completed: patient identified, site marked, surgical consent, pre-op evaluation, timeout performed, IV checked, risks and benefits discussed and monitors and equipment checked Spinal Block Patient position: sitting Prep: ChloraPrep and site prepped and draped Patient monitoring: heart rate, continuous pulse ox, blood pressure and cardiac monitor Approach: midline Location: L4-5 Injection technique: single-shot Needle Needle type: Whitacre and Introducer  Needle gauge: 24 G Needle length: 9 cm Additional Notes Negative paresthesia. Negative blood return. Positive free-flowing CSF. Expiration date of kit checked and confirmed. Patient tolerated procedure well, without complications.

## 2017-08-07 NOTE — Op Note (Signed)
DATE OF SURGERY:  08/07/2017 TIME: 11:05 AM  PATIENT NAME:  Kellie [facility]   AGE: 83 y.o.    PRE-OPERATIVE DIAGNOSIS:  M17.11 Unilateral primary osteoarthritis, right knee  POST-OPERATIVE DIAGNOSIS:  Same  PROCEDURE:  Procedure(s): TOTAL KNEE ARTHROPLASTY RIGHT KNEE   SURGEON:  Veta Dambrosia E, MD   ASSISTANT: [name], PAC  OPERATIVE IMPLANTS: Depuy LCS Femur/Patella size STANDARD, Tibia size # 2,  Rotating platform polyethylene size 10  mm  Total tourniquet time was 93 minutes.  PREOPERATIVE INDICATIONS:  [name] is a 83 y.o. year old female with end stage bone on bone degenerative arthritis of the knee who failed conservative treatment, including injections, antiinflammatories, activity modification, and assistive devices, and had significant impairment of their activities of daily living, and elected for Total Knee Arthroplasty.   The risks, benefits, and alternatives were discussed at length including but not limited to the risks of infection, bleeding, nerve injury, stiffness, blood clots, the need for revision surgery, cardiopulmonary complications, among others, and they were willing to proceed.  OPERATIVE FINDINGS AND UNIQUE ASPECTS OF THE CASE:  SEVERE VALGUS AND TRICOMPARTMENTAL ARTHRITIS  OPERATIVE DESCRIPTION:   The patient was brought to the operative room and placed in a supine position. Spinal anesthesia was administered. IV VANCOMYCIN antibiotics were given. The lower extremity was prepped and draped in the usual sterile fashion. Time out was performed. The leg was elevated and exsanguinated and the tourniquet was inflated to 350 mmHg  An anterior midline incision was made.  Anterior quadriceps tendon splitting approach was performed. The patella was everted and osteophytes were removed. The anterior horn of the medial and lateral meniscus was removed.  Then the extramedullary tibial cutting jig was utilized making the appropriate cut using the anterior  tibial crest as a reference building in appropriate posterior slope. Care was taken during the cut to protect the medial and collateral ligaments. The proximal tibia was removed along with the posterior horns of the menisci. The PCL was sacrificed. The ligaments were checked for balance and no releases needed to be done. The distal femur was sized as above . The anterior femoral cutting guide was aligned and centering hole made. The rotation guide was inserted and the anterior cutting guide pinned in place, and was in excellent alignment. The anterior and posterior femoral cuts were made. The flexion gap was established. The distal femoral cutting guide was introduced at 4 of valgus. This was pinned and the distal femoral cut made. The extension gap was established and was stable. The finishing guide was applied and finishing cuts made. The Mchale retractor was inserted and the keeled tibial trial was pinned in place. Centering hole was made and the keel inserted. The femoral component was inserted along with a polyethylene  insert and the knee articulated.  Extension and flexion showed good stability throughout. The patella was then sized and cut made for the patellar component. Centering holes were made. The trial was inserted and the knee articulated nicely with no need for lateral release. The trials were all removed and the knee thoroughly irrigated with pulsed lavage. Exparil was injected. The knee was dried and the cement mixed. The  keeled tibial component,  femoral component and patellar components were all cemented in place and excess cement was removed. The cement was allowed to harden for 10 minutes. Further irrigation was carried out. Bone wax was applied to all raw bony surfaces. Autovac drains were inserted. Quarter percent plain Marcaine, Toradol and morphine were  injected. The capsule was closed with #2 Quill suture, and the subcutaneous tissues were closed with 0 Quill suture. The skin was closed  with staples.Sponge and needle counts were correct.  Aquacel dressing with TENS pads and a [place] sterile dressing were applied. Polar Care and knee immobilizer were applied. Tourniquet was deflated with excellent return of blood flow to foot. Patient was transferred to a hospital bed and taken to the recovery room in good condition.  Park Breed, MD

## 2017-08-08 ENCOUNTER — Other Ambulatory Visit: Payer: Self-pay

## 2017-08-08 ENCOUNTER — Encounter: Payer: Self-pay | Admitting: Specialist

## 2017-08-08 LAB — BASIC METABOLIC PANEL
Anion gap: 5 (ref 5–15)
BUN: 11 mg/dL (ref 6–20)
CHLORIDE: 104 mmol/L (ref 101–111)
CO2: 25 mmol/L (ref 22–32)
Calcium: 9 mg/dL (ref 8.9–10.3)
Creatinine, Ser: 0.79 mg/dL (ref 0.44–1.00)
GFR calc Af Amer: >60 mL/min (ref >60)
GFR calc non Af Amer: >60 mL/min (ref >60)
GLUCOSE: 100 mg/dL — AB (ref 65–99)
POTASSIUM: 3.6 mmol/L (ref 3.5–5.1)
Sodium: 134 mmol/L — ABNORMAL LOW (ref 135–145)

## 2017-08-08 LAB — CBC
HCT: 30.1 % — ABNORMAL LOW (ref 35.0–47.0)
HEMOGLOBIN: 9.8 g/dL — AB (ref 12.0–16.0)
MCH: 28.5 pg (ref 26.0–34.0)
MCHC: 32.7 g/dL (ref 32.0–36.0)
MCV: 87.3 fL (ref 80.0–100.0)
Platelets: 208 10*3/uL (ref 150–440)
RBC: 3.45 MIL/uL — AB (ref 3.80–5.20)
RDW: 16.3 % — ABNORMAL HIGH (ref 11.5–14.5)
WBC: 11.5 10*3/uL — ABNORMAL HIGH (ref 3.6–11.0)

## 2017-08-08 NOTE — Progress Notes (Signed)
OT Cancellation Note  Patient Details Name: Kellie [facility] MRN: [id number] DOB: 14-Aug-1930   Cancelled Treatment:    Reason Eval/Treat Not Completed: Patient at procedure or test/ unavailable(PT with pt. OT will eval at a later time/date as pt. is available.)  Harrel Carina, MS, OTR/L 08/08/2017, 10:03 AM

## 2017-08-08 NOTE — Clinical Social Work Note (Signed)
Clinical Social Work Assessment  Patient Details  Name: Kellie [facility] MRN: [id number] Date of Birth: 1930-10-12  Date of referral:  08/08/17               Reason for consult:  Facility Placement                Permission sought to share information with:  Chartered certified accountant granted to share information::  Yes, Verbal Permission Granted  Name::      Kellie Harris::   Kellie Harris   Relationship::     Contact Information:     Housing/Transportation Living arrangements for the past 2 months:  [place] of Information:  Patient Patient Interpreter Needed:  None Criminal Activity/Legal Involvement Pertinent to Current Situation/Hospitalization:  No - Comment as needed Significant Relationships:  Adult Children Lives with:  Self Do you feel safe going back to the place where you live?  Yes Need for family participation in patient care:  Yes (Comment)  Care giving concerns:  Patient lives alone in [place].    Social Worker assessment / plan:  Holiday representative (Kellie Harris) received SNF consult. PT is recommending SNF. CSW met with patient alone at bedside to discuss D/C plan. Patient was alert and oriented X4 and was sitting up in the chair at bedside. CSW introduced self and explained role of CSW department. Patient reported that she lives alone in [place] and her daughter Kellie Harris is her HPOA. CSW explained SNF process and that Health Team will have to approve SNF. Patient is agreeable to SNF search in [facility]. FL2 complete and faxed out.   CSW presented bed offers to patient and she chose [facility]. Health Team SNF authorization is received, auth # [id number]. Patient is aware of above. Kellie [facility] liaison is aware of above. CSW will continue to follow and assist as needed.   Employment status:  Retired Nurse, adult PT Recommendations:  [place] / Referral to community  resources:  [place]  Patient/Family's Response to care:  Patient is agreeable to D/C to [facility].   Patient/Family's Understanding of and Emotional Response to Diagnosis, Current Treatment, and Prognosis:  Patient was very pleasant and thanked CSW for assistance.   Emotional Assessment Appearance:  Appears stated age Attitude/Demeanor/Rapport:    Affect (typically observed):  Accepting, Adaptable, Pleasant Orientation:  Oriented to Self, Oriented to Place, Oriented to  Time, Oriented to Situation Alcohol / Substance use:  Not Applicable Psych involvement (Current and /or in the community):  No (Comment)  Discharge Needs  Concerns to be addressed:  Discharge Planning Concerns Readmission within the last 30 days:  No Current discharge risk:  Dependent with Mobility Barriers to Discharge:  Continued Medical Work up   [employer], Veronia Beets, LCSW 08/08/2017, 2:58 PM

## 2017-08-08 NOTE — Progress Notes (Signed)
Physical Therapy Treatment Patient Details Name: Kellie Harris MRN: [id number] DOB: 10-Aug-1931 Today's Date: 08/08/2017    History of Present Illness Pt is a 83 y/o F who s/p R TKA.  Pt's PMH includes osteopenia, R knee fracture, cancer, L hip IM nailing 02/12/2017.      PT Comments    Pt in chair, requesting to use bathroom.  Stood with min guard and verbal cues for hand placement and ambulated to bathroom with min guard.  Pt instructed not to stand without assist and she verbalized understanding. Stepped out for privacy and pt stood without calling for assist.  Pt was using commode on back of knees for balance but was able to provide care without assist but min guard for safety.  Pt then started to ambulate to sink with underwear still down around knees and needed verbal cues to stop and pull them back up "Oh, I forgot", laughing.  She continued to sink where she washed her hands with walker sideways and used sink for support.  Verbal cues and education to increase safety.  LOB's during task which she recovered by using sink/counter.  She then continued to ambulate in hallway with verbal cues for step pattern to maintain PWB status.  She remained up in recliner to eat lunch with polar care and chair alarm set.    Pt continues with balance and safety deficits which put her at increased risk for falls.  Education to slow and think about tasks and she voiced understanding but continues to need education and +1 assist for tasks.   SNF remains appropriate at this time.   Follow Up Recommendations  SNF     Equipment Recommendations  None recommended by PT    Recommendations for Other Services       Precautions / Restrictions Precautions Precautions: Fall Required Braces or Orthoses: Knee Immobilizer - Right Knee Immobilizer - Right: On when out of bed or walking Restrictions Weight Bearing Restrictions: Yes RLE Weight Bearing: Partial weight bearing RLE Partial Weight Bearing Percentage or  Pounds: 60    Mobility  Bed Mobility Overal bed mobility: Needs Assistance Bed Mobility: Supine to Sit     Supine to sit: Supervision;HOB elevated     General bed mobility comments: remained up in recliner  Transfers Overall transfer level: Needs assistance Equipment used: Rolling walker (2 wheeled) Transfers: Sit to/from Stand Sit to Stand: Min guard;Min assist         General transfer comment: Cues for proper hand placement and safe technique. unsteady initially upon standing  Ambulation/Gait Ambulation/Gait assistance: Min guard;Min assist Ambulation Distance (Feet): 120 Feet Assistive device: Rolling walker (2 wheeled) Gait Pattern/deviations: Step-to pattern Gait velocity: decreased Gait velocity interpretation: Below normal speed for age/gender General Gait Details: Cues for proper sequencing using RW and for upright posture.     Stairs            Wheelchair Mobility    Modified Rankin (Stroke Patients Only)       Balance Overall balance assessment: Needs assistance;History of Falls Sitting-balance support: No upper extremity supported;Feet supported Sitting balance-Leahy Scale: Good     Standing balance support: No upper extremity supported;During functional activity Standing balance-Leahy Scale: Fair Standing balance comment: Pt able to stand statically without UE support but relies on UE support for dynamic activities.                             Cognition Arousal/Alertness: Awake/alert  Behavior During Therapy: WFL for tasks assessed/performed Overall Cognitive Status: Within Functional Limits for tasks assessed                                        Exercises Total Joint Exercises Ankle Circles/Pumps: AROM;Both;10 reps;Supine Quad Sets: Strengthening;Both;10 reps;Supine Short Arc Quad: AAROM;Strengthening;Supine;Right;10 reps Hip ABduction/ADduction: Strengthening;Right;10 reps;Supine Straight Leg Raises:  Strengthening;Right;10 reps;Supine Goniometric ROM: 5-96 - seemed limited by bulky post op dressing vs actual knee ROM Other Exercises Other Exercises: to/from commode in room for loose BM    General Comments        Pertinent Vitals/Pain Pain Assessment: No/denies pain    Home Living                      Prior Function            PT Goals (current goals can now be found in the care plan section) Progress towards PT goals: Progressing toward goals    Frequency    BID      PT Plan Current plan remains appropriate    Co-evaluation              AM-PAC PT "6 Clicks" Daily Activity  Outcome Measure  Difficulty turning over in bed (including adjusting bedclothes, sheets and blankets)?: None Difficulty moving from lying on back to sitting on the side of the bed? : None Difficulty sitting down on and standing up from a chair with arms (e.g., wheelchair, bedside commode, etc,.)?: A Little Help needed moving to and from a bed to chair (including a wheelchair)?: A Little Help needed walking in hospital room?: A Little Help needed climbing 3-5 steps with a railing? : A Lot 6 Click Score: 19    End of Session Equipment Utilized During Treatment: Gait belt;Right knee immobilizer Activity Tolerance: Patient tolerated treatment well Patient left: in chair;with call bell/phone within reach;with chair alarm set   Pain - Right/Left: Right Pain - part of body: Knee     Time: 6568-1275 PT Time Calculation (min) (ACUTE ONLY): 11 min  Charges:  $Gait Training: 8-22 mins $Therapeutic Exercise: 8-22 mins                    G Codes:       Chesley Noon, PTA 08/08/17, 1:31 PM

## 2017-08-08 NOTE — Clinical Social Work Placement (Signed)
   CLINICAL SOCIAL WORK PLACEMENT  NOTE  Date:  08/08/2017  Patient Details  Name: Kellie [facility] MRN: [id number] Date of Birth: 1930/11/18  Clinical Social Work is seeking post-discharge placement for this patient at the Why level of care (*CSW will initial, date and re-position this form in  chart as items are completed):  Yes   Patient/family provided with [place] Work Department's list of facilities offering this level of care within the geographic area requested by the patient (or if unable, by the patient's family).  Yes   Patient/family informed of their freedom to choose among providers that offer the needed level of care, that participate in Medicare, Medicaid or managed care program needed by the patient, have an available bed and are willing to accept the patient.  Yes   Patient/family informed of [facility] ownership interest in [facility] and [facility], as well as of the fact that they are under no obligation to receive care at these facilities.  PASRR submitted to EDS on 08/07/17     PASRR number received on 08/07/17     Existing PASRR number confirmed on       FL2 transmitted to all facilities in geographic area requested by pt/family on 08/07/17     FL2 transmitted to all facilities within larger geographic area on       Patient informed that his/her managed care company has contracts with or will negotiate with certain facilities, including the following:        Yes   Patient/family informed of bed offers received.  Patient chooses bed at ([facility] )     Physician recommends and patient chooses bed at      Patient to be transferred to   on  .  Patient to be transferred to facility by       Patient family notified on   of transfer.  Name of family member notified:        PHYSICIAN       Additional Comment:    _______________________________________________ Mirabella Hilario, Veronia Beets, LCSW 08/08/2017, 2:57 PM

## 2017-08-08 NOTE — Anesthesia Postprocedure Evaluation (Signed)
Anesthesia Post Note  Patient: Kellie [facility]  Procedure(s) Performed: TOTAL KNEE ARTHROPLASTY (Right Knee)  Patient location during evaluation: Nursing Unit Anesthesia Type: Spinal Level of consciousness: awake, awake and alert and oriented Pain management: pain level controlled Vital Signs Assessment: post-procedure vital signs reviewed and stable Respiratory status: nonlabored ventilation, spontaneous breathing and respiratory function stable Cardiovascular status: blood pressure returned to baseline and stable Postop Assessment: no headache and no backache Anesthetic complications: no     Last Vitals:  Vitals:   08/08/17 0505 08/08/17 0718  BP: (!) 120/54 (!) 98/53  Pulse: 69 78  Resp: 18 18  Temp: 36.9 C 37.1 C  SpO2: 96% 96%    Last Pain:  Vitals:   08/08/17 0718  TempSrc: Oral  PainSc:                  Johnna Acosta

## 2017-08-08 NOTE — Progress Notes (Signed)
Initial Nutrition Assessment  DOCUMENTATION CODES:   Non-severe (moderate) malnutrition in context of chronic illness  INTERVENTION:  1. Ensure Enlive po BID, each supplement provides 350 kcal and 20 grams of protein NUTRITION DIAGNOSIS:   Moderate Malnutrition related to chronic illness as evidenced by moderate fat depletion, moderate muscle depletion.  GOAL:   Patient will meet greater than or equal to 90% of their needs  MONITOR:   PO intake, I & O's, Labs, Supplement acceptance, Weight trends  REASON FOR ASSESSMENT:   Consult, Malnutrition Screening Tool Hip fracture protocol  ASSESSMENT:   Kellie Harris is an 83 yo female with PMH of degenerative disc disease, L Hip fx s/p IM nail 02/2017, HTN, presents with R knee end stage bone on bone degenerative arthritis now s/p R TKA.   Spoke with Kellie Harris at bedside. She ate eggs and grits this morning, states she ate 100% Normally eats cheerios or oatmeal with coffee and orange juice for breakfast, gets up late so she doesn't really eat lunch, but will snack on crackers, and eats out for dinner. Normally only eats half of this meal and eats the other half the next day. Appetite is good now, states it is "better than it has ever been." Denies weight loss over the past year. Seems concerned with gaining weight and monitoring PO intake.  Labs reviewed:  Na 134  Medications reviewed and include:  Acidophilus, Hemocyte, Senokot, MVI w/ Minerals 1/2 NS at 47mL/hr  Meal Completion: 75-95%    Intake/Output Summary (Last 24 hours) at 08/08/2017 1530 Last data filed at 08/08/2017 1432 Gross per 24 hour  Intake 1331.25 ml  Output 1700 ml  Net -368.75 ml   NUTRITION - FOCUSED PHYSICAL EXAM:    Most Recent Value  Orbital Region  No depletion  Upper Arm Region  Moderate depletion  Thoracic and Lumbar Region  Unable to assess  Buccal Region  Moderate depletion  Temple Region  Moderate depletion  Clavicle Bone Region  Severe depletion   Clavicle and Acromion Bone Region  Moderate depletion  Scapular Bone Region  Moderate depletion  Dorsal Hand  Moderate depletion  Patellar Region  Moderate depletion  Anterior Thigh Region  Moderate depletion  Posterior Calf Region  Moderate depletion  Edema (RD Assessment)  Mild  Hair  Reviewed  Eyes  Reviewed  Mouth  Reviewed  Skin  Reviewed  Nails  Reviewed       Diet Order:  Diet regular Room service appropriate? Yes; Fluid consistency: Thin  EDUCATION NEEDS:   Education needs have been addressed  Skin:  Skin Assessment: Skin Integrity Issues: Skin Integrity Issues:: Incisions Incisions: Closed incision to R Knee and R Eye  Last BM:  12/26 (Type 6)  Height:   Ht Readings from Last 1 Encounters:  08/07/17 4\' 11"  (1.499 m)    Weight:   Wt Readings from Last 1 Encounters:  08/07/17 100 lb (45.4 kg)    Ideal Body Weight:  44.69 kg  BMI:  Body mass index is 20.2 kg/m.  Estimated Nutritional Needs:   Kcal:  1300-1600 calories  Protein:  68-77 grams (1.5-1.7g/kg)  Fluid:  >1.5L  Kellie [facility]. Kellie Picone, MS, RD LDN Inpatient Clinical Dietitian Pager [phone]

## 2017-08-08 NOTE — Progress Notes (Signed)
Physical Therapy Treatment Patient Details Name: Kellie Harris MRN: [id number] DOB: 1931-01-27 Today's Date: 08/08/2017    History of Present Illness Pt is a 83 y/o F who s/p R TKA.  Pt's PMH includes osteopenia, R knee fracture, cancer, L hip IM nailing 02/12/2017.      PT Comments    Participated in exercises as described below.  Chart review noted BP to be low this am.  Checked prior to gait and BP had increased to 144/84.  No dizziness noted with gait but she did state she felt a bit light headed upon return to the chair but stated she was "OK". Pt required full assist to don knee immobilizer got gait/out of bed per MD orders.  She was able to increase her gait to 76' PWB with RW and min guard/assist this am.  Pt does present with some balance deficits.  Initially upon standing she required min assist to recover and on several occasions during gait needed min assist to correct imbalances.  Overall is progressing well but she is unsafe to ambulate without assist at this time due to balance deficits and inability to recover without assist.  She remains at an increased risk for falls at this time and requires assist to manage KI per orders.  SNF remains appropriate at this time.   Follow Up Recommendations  SNF     Equipment Recommendations  None recommended by PT    Recommendations for Other Services       Precautions / Restrictions Precautions Precautions: Fall Required Braces or Orthoses: Knee Immobilizer - Right Knee Immobilizer - Right: On when out of bed or walking Restrictions Weight Bearing Restrictions: Yes RLE Weight Bearing: Partial weight bearing RLE Partial Weight Bearing Percentage or Pounds: 60    Mobility  Bed Mobility Overal bed mobility: Needs Assistance Bed Mobility: Supine to Sit     Supine to sit: Supervision;HOB elevated     General bed mobility comments: Supervision for safety.  Increased time.  No physical assist required.  Transfers Overall transfer  level: Needs assistance Equipment used: Rolling walker (2 wheeled) Transfers: Sit to/from Stand Sit to Stand: Min guard;Min assist         General transfer comment: Cues for proper hand placement and safe technique. unsteady initially upon standing  Ambulation/Gait Ambulation/Gait assistance: Min guard;Min assist Ambulation Distance (Feet): 90 Feet Assistive device: Rolling walker (2 wheeled) Gait Pattern/deviations: Step-to pattern Gait velocity: decreased Gait velocity interpretation: Below normal speed for age/gender General Gait Details: Cues for proper sequencing using RW and for upright posture.     Stairs            Wheelchair Mobility    Modified Rankin (Stroke Patients Only)       Balance Overall balance assessment: Needs assistance;History of Falls   Sitting balance-Leahy Scale: Good     Standing balance support: No upper extremity supported;During functional activity Standing balance-Leahy Scale: Fair Standing balance comment: Pt able to stand statically without UE support but relies on UE support for dynamic activities.                             Cognition Arousal/Alertness: Awake/alert Behavior During Therapy: WFL for tasks assessed/performed Overall Cognitive Status: Within Functional Limits for tasks assessed  Exercises Total Joint Exercises Ankle Circles/Pumps: AROM;Both;10 reps;Supine Quad Sets: Strengthening;Both;10 reps;Supine Short Arc Quad: AAROM;Strengthening;Supine;Right;10 reps Hip ABduction/ADduction: Strengthening;Right;10 reps;Supine Straight Leg Raises: Strengthening;Right;10 reps;Supine Goniometric ROM: 5-96 - seemed limited by bulky post op dressing vs actual knee ROM    General Comments        Pertinent Vitals/Pain Pain Assessment: No/denies pain    Home Living                      Prior Function            PT Goals (current goals can now  be found in the care plan section) Progress towards PT goals: Progressing toward goals    Frequency    BID      PT Plan Current plan remains appropriate    Co-evaluation              AM-PAC PT "6 Clicks" Daily Activity  Outcome Measure  Difficulty turning over in bed (including adjusting bedclothes, sheets and blankets)?: None Difficulty moving from lying on back to sitting on the side of the bed? : None Difficulty sitting down on and standing up from a chair with arms (e.g., wheelchair, bedside commode, etc,.)?: A Little Help needed moving to and from a bed to chair (including a wheelchair)?: A Little Help needed walking in hospital room?: A Little Help needed climbing 3-5 steps with a railing? : A Lot 6 Click Score: 19    End of Session Equipment Utilized During Treatment: Gait belt;Right knee immobilizer Activity Tolerance: Patient tolerated treatment well Patient left: in chair;with call bell/phone within reach;with chair alarm set   Pain - Right/Left: Right Pain - part of body: Knee     Time: 0935-1007 PT Time Calculation (min) (ACUTE ONLY): 32 min  Charges:  $Gait Training: 8-22 mins $Therapeutic Exercise: 8-22 mins                    G Codes:      Chesley Noon, PTA 08/08/17, 10:26 AM

## 2017-08-08 NOTE — Evaluation (Signed)
Occupational Therapy Evaluation Patient Details Name: Kellie ELIZARRARAZ MRN: [id number] DOB: [birth year] Today's Date: 08/08/2017    History of Present Illness Pt is a 83 y/o F who s/p R TKA.  Pt's PMH includes osteopenia, R knee fracture, cancer, L hip IM nailing 02/12/2017.     Clinical Impression   Pt seen for OT evaluation this date. Pt was independent prior to admission, using a SPC occasionally for community mobility. Pt presents with deficits in R knee ROM/strength, R knee pain, decreased activity tolerance, decreased safety awareness and increased need for assist with ADL tasks. Pt educated in compression stocking mgt and use of AE/DME for LB ADL tasks. Pt/son educated in polar care mgt. Pt verbalized understanding of education/training provided and would benefit from additional skilled OT services to address noted impairments and functional deficits in order to maximize return to PLOF and minimize risk of future falls/injury/readmission. Recommend transition to STR following hospitalization.    Follow Up Recommendations  SNF    Equipment Recommendations  None recommended by OT    Recommendations for Other Services       Precautions / Restrictions Precautions Precautions: Fall Required Braces or Orthoses: Knee Immobilizer - Right Knee Immobilizer - Right: On when out of bed or walking Restrictions Weight Bearing Restrictions: Yes RLE Weight Bearing: Partial weight bearing RLE Partial Weight Bearing Percentage or Pounds: 50      Mobility Bed Mobility               General bed mobility comments: deferred, up in recliner  Transfers Overall transfer level: Needs assistance Equipment used: Rolling walker (2 wheeled) Transfers: Sit to/from Stand Sit to Stand: Min guard         General transfer comment: VC for safety and for hand placement    Balance Overall balance assessment: Needs assistance;History of Falls Sitting-balance support: No upper extremity supported;Feet  supported Sitting balance-Leahy Scale: Good     Standing balance support: No upper extremity supported;During functional activity Standing balance-Leahy Scale: Fair Standing balance comment: Pt able to stand statically without UE support but relies on UE support for dynamic activities.                            ADL either performed or assessed with clinical judgement   ADL Overall ADL's : Needs assistance/impaired Eating/Feeding: Set up;Sitting   Grooming: Standing;Min guard   Upper Body Bathing: Sitting;Set up;Supervision/ safety   Lower Body Bathing: Sit to/from stand;Moderate assistance   Upper Body Dressing : Sitting;Set up;Supervision/safety   Lower Body Dressing: Sit to/from stand;Moderate assistance Lower Body Dressing Details (indicate cue type and reason): pt educated in use of AE for LB dressing with verbal instruction and visual demonstration Toilet Transfer: RW;Min guard;Comfort height toilet;Ambulation;Cueing for safety           Functional mobility during ADLs: Cueing for safety;Min guard;Rolling walker       Vision Baseline Vision/History: Wears glasses Wears Glasses: At all times Patient Visual Report: No change from baseline Vision Assessment?: No apparent visual deficits     Perception     Praxis      Pertinent Vitals/Pain Pain Assessment: 0-10 Pain Score: 3  Pain Location: R knee Pain Descriptors / Indicators: Aching Pain Intervention(s): Limited activity within patient's tolerance;Monitored during session;Ice applied     Hand Dominance Right   Extremity/Trunk Assessment Upper Extremity Assessment Upper Extremity Assessment: Overall WFL for tasks assessed   Lower Extremity Assessment Lower  Extremity Assessment: Defer to PT evaluation   Cervical / Trunk Assessment Cervical / Trunk Assessment: Normal   Communication Communication Communication: HOH   Cognition Arousal/Alertness: Awake/alert Behavior During Therapy: WFL  for tasks assessed/performed Overall Cognitive Status: Within Functional Limits for tasks assessed                                     General Comments       Exercises Other Exercises Other Exercises: pt/son educated in polar care mgt strategies   Shoulder Instructions      Home Living Family/patient expects to be discharged to:: Skilled nursing facility Living Arrangements: Alone Available Help at Discharge: Family;Available PRN/intermittently Type of Home: House Home Access: Stairs to enter Entrance Stairs-Number of Steps: 1 Entrance Stairs-Rails: (grab bar on L side) Home Layout: One level     Bathroom Shower/Tub: Teacher, early years/pre: Standard     Home Equipment: Cane - single point;Shower seat;Walker - 2 wheels;Walker - 4 wheels;Adaptive equipment Adaptive Equipment: Reacher        Prior Functioning/Environment Level of Independence: Independent with assistive device(s)        Comments: Pt ambulates with SPC in community and no AD in her home.  Pt independent with all ADL/IADs.  Pt had a fall in July 2018 with surgery to repair L hip.        OT Problem List: Decreased strength;Decreased knowledge of use of DME or AE;Decreased range of motion;Decreased safety awareness;Impaired balance (sitting and/or standing);Decreased activity tolerance;Pain      OT Treatment/Interventions: Self-care/ADL training;Therapeutic exercise;DME and/or AE instruction;Patient/family education    OT Goals(Current goals can be found in the care plan section) Acute Rehab OT Goals Patient Stated Goal: to get stronger and become more independent OT Goal Formulation: With patient/family Time For Goal Achievement: 08/22/17 Potential to Achieve Goals: Good  OT Frequency: Min 2X/week   Barriers to D/C: Decreased caregiver support;Inaccessible home environment          Co-evaluation              AM-PAC PT "6 Clicks" Daily Activity     Outcome  Measure Help from another person eating meals?: None Help from another person taking care of personal grooming?: A Little Help from another person toileting, which includes using toliet, bedpan, or urinal?: A Little Help from another person bathing (including washing, rinsing, drying)?: A Lot Help from another person to put on and taking off regular upper body clothing?: A Little Help from another person to put on and taking off regular lower body clothing?: A Lot 6 Click Score: 17   End of Session    Activity Tolerance: Patient tolerated treatment well Patient left: in chair;with call bell/phone within reach;with chair alarm set;with family/visitor present;Other (comment)(polar care in place)  OT Visit Diagnosis: Other abnormalities of gait and mobility (R26.89);History of falling (Z91.81);Muscle weakness (generalized) (M62.81)                Time: 3976-7341 OT Time Calculation (min): 15 min Charges:  OT General Charges $OT Visit: 1 Visit OT Evaluation $OT Eval Low Complexity: 1 Low OT Treatments $Self Care/Home Management : 8-22 mins G-Codes: OT G-codes **NOT FOR INPATIENT CLASS** Functional Assessment Tool Used: AM-PAC 6 Clicks Daily Activity;Clinical judgement Functional Limitation: Self care Self Care Current Status (P3790): At least 40 percent but less than 60 percent impaired, limited or restricted Self Care Goal  Status [phone]): At least 20 percent but less than 40 percent impaired, limited or restricted   Jeni Salles, MPH, MS, OTR/L ascom [phone] 08/08/17, 4:03 PM

## 2017-08-08 NOTE — Progress Notes (Signed)
Subjective:  Doing very well.  Walked with PT in hall.  Drain out.  csm good distally.  Slept all night  1 Day Post-Op Procedure(s) (LRB): TOTAL KNEE ARTHROPLASTY (Right)    Patient reports pain as mild.  Objective:   VITALS:   Vitals:   08/08/17 1124 08/08/17 1124  BP: 128/72 128/72  Pulse: 71 71  Resp: 18   Temp: 97.6 F (36.4 C) 97.6 F (36.4 C)  SpO2: 94% 94%    Neurologically intact ABD soft Neurovascular intact Sensation intact distally Intact pulses distally Dorsiflexion/Plantar flexion intact Incision: no drainage  LABS Recent Labs    08/07/17 1238 08/08/17 0423  HGB 12.0 9.8*  HCT 36.1 30.1*  WBC 7.2 11.5*  PLT 221 208    Recent Labs    08/07/17 1238 08/08/17 0423  NA  --  134*  K  --  3.6  BUN  --  11  CREATININE 0.71 0.79  GLUCOSE  --  100*    No results for input(s): LABPT, INR in the last 72 hours.   Assessment/Plan: 1 Day Post-Op Procedure(s) (LRB): TOTAL KNEE ARTHROPLASTY (Right)   Advance diet Up with therapy D/C IV fluids Discharge to SNF tomorrow

## 2017-08-09 DIAGNOSIS — Z7982 Long term (current) use of aspirin: Secondary | ICD-10-CM | POA: Diagnosis not present

## 2017-08-09 DIAGNOSIS — G8918 Other acute postprocedural pain: Secondary | ICD-10-CM | POA: Diagnosis not present

## 2017-08-09 DIAGNOSIS — K580 Irritable bowel syndrome with diarrhea: Secondary | ICD-10-CM | POA: Diagnosis not present

## 2017-08-09 DIAGNOSIS — D649 Anemia, unspecified: Secondary | ICD-10-CM | POA: Diagnosis not present

## 2017-08-09 DIAGNOSIS — M6281 Muscle weakness (generalized): Secondary | ICD-10-CM | POA: Diagnosis not present

## 2017-08-09 DIAGNOSIS — K5900 Constipation, unspecified: Secondary | ICD-10-CM | POA: Diagnosis not present

## 2017-08-09 DIAGNOSIS — M1711 Unilateral primary osteoarthritis, right knee: Secondary | ICD-10-CM | POA: Diagnosis not present

## 2017-08-09 DIAGNOSIS — E785 Hyperlipidemia, unspecified: Secondary | ICD-10-CM | POA: Diagnosis not present

## 2017-08-09 DIAGNOSIS — Z96659 Presence of unspecified artificial knee joint: Secondary | ICD-10-CM | POA: Diagnosis not present

## 2017-08-09 DIAGNOSIS — R509 Fever, unspecified: Secondary | ICD-10-CM | POA: Diagnosis not present

## 2017-08-09 DIAGNOSIS — Z96651 Presence of right artificial knee joint: Secondary | ICD-10-CM | POA: Diagnosis not present

## 2017-08-09 DIAGNOSIS — R2689 Other abnormalities of gait and mobility: Secondary | ICD-10-CM | POA: Diagnosis not present

## 2017-08-09 DIAGNOSIS — E569 Vitamin deficiency, unspecified: Secondary | ICD-10-CM | POA: Diagnosis not present

## 2017-08-09 DIAGNOSIS — S72143D Displaced intertrochanteric fracture of unspecified femur, subsequent encounter for closed fracture with routine healing: Secondary | ICD-10-CM | POA: Diagnosis not present

## 2017-08-09 DIAGNOSIS — I10 Essential (primary) hypertension: Secondary | ICD-10-CM | POA: Diagnosis not present

## 2017-08-09 LAB — CBC
HEMATOCRIT: 30.6 % — AB (ref 35.0–47.0)
Hemoglobin: 10 g/dL — ABNORMAL LOW (ref 12.0–16.0)
MCH: 28.5 pg (ref 26.0–34.0)
MCHC: 32.6 g/dL (ref 32.0–36.0)
MCV: 87.3 fL (ref 80.0–100.0)
Platelets: 209 10*3/uL (ref 150–440)
RBC: 3.51 MIL/uL — ABNORMAL LOW (ref 3.80–5.20)
RDW: 15.9 % — AB (ref 11.5–14.5)
WBC: 11.3 10*3/uL — ABNORMAL HIGH (ref 3.6–11.0)

## 2017-08-09 MED ORDER — CELECOXIB 200 MG PO CAPS: 200.0000 mg | ORAL_CAPSULE | Freq: Two times a day (BID) | ORAL | 3 refills | Status: DC

## 2017-08-09 MED ORDER — METHOCARBAMOL 500 MG PO TABS: 500.0000 mg | ORAL_TABLET | Freq: Three times a day (TID) | ORAL | 1 refills | Status: DC

## 2017-08-09 MED ORDER — HYDROCODONE-ACETAMINOPHEN 5-325 MG PO TABS: 1.0000 | ORAL_TABLET | Freq: Four times a day (QID) | ORAL | 0 refills | Status: DC | PRN

## 2017-08-09 MED ORDER — GABAPENTIN 100 MG PO CAPS: 100.0000 mg | ORAL_CAPSULE | Freq: Three times a day (TID) | ORAL | 3 refills | Status: DC

## 2017-08-09 MED ORDER — PRAVASTATIN SODIUM 20 MG PO TABS: 20.0000 mg | ORAL_TABLET | Freq: Every day | ORAL | 3 refills | Status: DC

## 2017-08-09 MED ORDER — ASPIRIN EC 325 MG PO TBEC
325.0000 mg | DELAYED_RELEASE_TABLET | Freq: Two times a day (BID) | ORAL | 0 refills | Status: DC
Start: 2017-08-09 — End: 2017-11-05

## 2017-08-09 NOTE — Progress Notes (Signed)
Physical Therapy Treatment Patient Details Name: Kellie Harris MRN: [id number] DOB: 1931/07/02 Today's Date: 08/09/2017    History of Present Illness Pt is a 83 y/o F who s/p R TKA.  Pt's PMH includes osteopenia, R knee fracture, cancer, L hip IM nailing 02/12/2017.      PT Comments    Pt in bed, needing to void.  KI donned and pt was assisted to bathroom.  After voiding she was able to ambulate around unit with walker and min guard/assist.  Participated in exercises as described below.  Pt continued to increase overall ambulation distances and ROM but safety and balance remains primary barriers for returning home.  Pt continues with decreased awareness of safety with poor hand placements during transfers, generally impulsive and when unsteady with gait/transfers, she will continue to ambulate before gaining balance requiring min assist to prevent falls.  When standing, relies on bed or commode for support on the back of her legs. When washing hands, pt chooses poor walker position and will turn her body to reach sink vs turning to stand and face the sink to prevent twisting of right knee per precautions.  Verbal review and education provided but she needs further monitoring and education.  Pt with several instances during bathroom transfer and gait which could have resulted in falls if unattended.  Discussed and educated pt on general safety but she continues with impulsivity and lack of awareness.  Pt attributes some of this today to taking pain meds during the night and remaining groggy this morning but behaviors and deficits were also present yesterday. She is also unable to manage KI at this time for mobility per orders.  SNF remains most appropriate discharge at this time.  Anticipated d/c to [facility].    Follow Up Recommendations  SNF     Equipment Recommendations  None recommended by PT    Recommendations for Other Services       Precautions / Restrictions Precautions Precautions:  Fall Required Braces or Orthoses: Knee Immobilizer - Right Knee Immobilizer - Right: On when out of bed or walking Restrictions Weight Bearing Restrictions: Yes RLE Weight Bearing: Partial weight bearing RLE Partial Weight Bearing Percentage or Pounds: 60 lbs    Mobility  Bed Mobility Overal bed mobility: Modified Independent Bed Mobility: Supine to Sit     Supine to sit: Supervision;HOB elevated        Transfers Overall transfer level: Needs assistance Equipment used: Rolling walker (2 wheeled)   Sit to Stand: Min guard;Min assist         General transfer comment: VC for safety and for hand placement  Ambulation/Gait Ambulation/Gait assistance: Min guard;Min assist Ambulation Distance (Feet): 180 Feet Assistive device: Rolling walker (2 wheeled) Gait Pattern/deviations: Step-to pattern;Staggering left;Staggering right   Gait velocity interpretation: Below normal speed for age/gender General Gait Details: Cues for proper sequencing using RW and for upright posture.     Stairs            Wheelchair Mobility    Modified Rankin (Stroke Patients Only)       Balance Overall balance assessment: Needs assistance;History of Falls Sitting-balance support: No upper extremity supported;Feet supported Sitting balance-Leahy Scale: Good     Standing balance support: Bilateral upper extremity supported;During functional activity Standing balance-Leahy Scale: Poor                              Cognition Arousal/Alertness: Awake/alert Behavior During Therapy: [facility]  for tasks assessed/performed;Impulsive Overall Cognitive Status: Within Functional Limits for tasks assessed                                        Exercises Total Joint Exercises Ankle Circles/Pumps: AROM;Both;10 reps;Supine Quad Sets: Strengthening;Both;10 reps;Supine Heel Slides: Strengthening;AROM;Right;10 reps;Supine Hip ABduction/ADduction: Strengthening;Right;10  reps;Supine Straight Leg Raises: Strengthening;Right;10 reps;Supine Long Arc Quad: 10 reps;AROM;Strengthening;Right;Seated Knee Flexion: AAROM;Right;Seated;5 reps Goniometric ROM: 3-96 - limited by pain and bulky dressing Other Exercises Other Exercises: to commode to void    General Comments        Pertinent Vitals/Pain Pain Assessment: 0-10 Pain Score: 5  Pain Location: R knee Pain Descriptors / Indicators: Aching Pain Intervention(s): Patient requesting pain meds-RN notified;Limited activity within patient's tolerance;Ice applied;Monitored during session    Home Living                      Prior Function            PT Goals (current goals can now be found in the care plan section) Progress towards PT goals: Progressing toward goals    Frequency    BID      PT Plan Current plan remains appropriate    Co-evaluation              AM-PAC PT "6 Clicks" Daily Activity  Outcome Measure  Difficulty turning over in bed (including adjusting bedclothes, sheets and blankets)?: None Difficulty moving from lying on back to sitting on the side of the bed? : None Difficulty sitting down on and standing up from a chair with arms (e.g., wheelchair, bedside commode, etc,.)?: A Little Help needed moving to and from a bed to chair (including a wheelchair)?: A Little Help needed walking in hospital room?: A Little Help needed climbing 3-5 steps with a railing? : A Lot 6 Click Score: 19    End of Session Equipment Utilized During Treatment: Gait belt;Right knee immobilizer Activity Tolerance: Patient tolerated treatment well Patient left: in chair;with call bell/phone within reach;with chair alarm set Nurse Communication: Patient requests pain meds Pain - Right/Left: Right Pain - part of body: Knee     Time: 6789-3810 PT Time Calculation (min) (ACUTE ONLY): 24 min  Charges:  $Gait Training: 8-22 mins $Therapeutic Exercise: 8-22 mins                    G  Codes:       Chesley Noon, PTA 08/09/17, 9:06 AM

## 2017-08-09 NOTE — Care Management Important Message (Signed)
Important Message  Patient Details  Name: Kellie Harris MRN: [id number] Date of Birth: [birth year]   Medicare Important Message Given:  Yes    Jolly Mango, RN 08/09/2017, 2:41 PM

## 2017-08-09 NOTE — Progress Notes (Signed)
Patient's daughter here to pick up patient. NT wheeled out patient.

## 2017-08-09 NOTE — Clinical Social Work Placement (Signed)
   CLINICAL SOCIAL WORK PLACEMENT  NOTE  Date:  08/09/2017  Patient Details  Name: Kellie Harris MRN: [id number] Date of Birth: 1930-09-17  Clinical Social Work is seeking post-discharge placement for this patient at the [place] level of care (*CSW will initial, date and re-position this form in  chart as items are completed):  Yes   Patient/family provided with [place] Work Department's list of facilities offering this level of care within the geographic area requested by the patient (or if unable, by the patient's family).  Yes   Patient/family informed of their freedom to choose among providers that offer the needed level of care, that participate in Medicare, Medicaid or managed care program needed by the patient, have an available bed and are willing to accept the patient.  Yes   Patient/family informed of [facility]'s ownership interest in [facility] and [facility], as well as of the fact that they are under no obligation to receive care at these facilities.  PASRR submitted to EDS on 08/07/17     PASRR number received on 08/07/17     Existing PASRR number confirmed on       FL2 transmitted to all facilities in geographic area requested by pt/family on 08/07/17     FL2 transmitted to all facilities within larger geographic area on       Patient informed that his/her managed care company has contracts with or will negotiate with certain facilities, including the following:        Yes   Patient/family informed of bed offers received.  Patient chooses bed at ([facility] )     Physician recommends and patient chooses bed at      Patient to be transferred to ([facility] ) on 08/09/17.  Patient to be transferred to facility by (Patient's daughter [name] will transport. )     Patient family notified on 08/09/17 of transfer.  Name of family member notified:  (Patient's daughter [name] is aware of D/C today. )     PHYSICIAN        Additional Comment:    _______________________________________________ Clint Strupp, Veronia Beets, LCSW 08/09/2017, 2:37 PM

## 2017-08-09 NOTE — Progress Notes (Signed)
Subjective: knee feels well.  Right shoulder pain today.  Ready for SNF today.  hgb stable.    2 Days Post-Op Procedure(s) (LRB): TOTAL KNEE ARTHROPLASTY (Right)    Patient reports pain as mild.  Objective:   VITALS:   Vitals:   08/09/17 0415 08/09/17 0752  BP: 120/68 (!) 143/75  Pulse: 84 69  Resp: 19 16  Temp: 98.2 F (36.8 C) 98.2 F (36.8 C)  SpO2: 93% 99%    Neurologically intact ABD soft Neurovascular intact Sensation intact distally Intact pulses distally Dorsiflexion/Plantar flexion intact Incision: no drainage  LABS Recent Labs    08/07/17 1238 08/08/17 0423 08/09/17 0403  HGB 12.0 9.8* 10.0*  HCT 36.1 30.1* 30.6*  WBC 7.2 11.5* 11.3*  PLT 221 208 209    Recent Labs    08/07/17 1238 08/08/17 0423  NA  --  134*  K  --  3.6  BUN  --  11  CREATININE 0.71 0.79  GLUCOSE  --  100*    No results for input(s): LABPT, INR in the last 72 hours.   Assessment/Plan: 2 Days Post-Op Procedure(s) (LRB): TOTAL KNEE ARTHROPLASTY (Right)   Advance diet Up with therapy Discharge to SNF today  RTC 1 week  EC ASA bid for 6 weeks

## 2017-08-09 NOTE — Progress Notes (Signed)
Patient is being discharged today to [facility] room 807. Report called to Yaakov Guthrie. Patient's daughter will pick up to transport to facility today after 330pm. NT preparing patient for transport and packing belongings. DC packet printed and given to patient. Nurse removed IV.

## 2017-08-09 NOTE — Progress Notes (Signed)
Patient is medically stable for D/C to [facility] today. Per [name] liaison patient can come today to room 807. [facility] authorization has been received. RN will call report to RN Yaakov Guthrie at [phone] and patient's daughter [name] will provide transport. Clinical Education officer, museum (CSW) sent D/C orders to [facility] via [facility]. Patient is aware of above. CSW contacted patient's daughter [name] and made her aware of above. Please reconsult if future social work needs arise. CSW signing off.   [employer], LCSW [phone]

## 2017-08-09 NOTE — Discharge Summary (Signed)
Physician Discharge Summary  Patient ID: Kellie Harris MRN: [id number] DOB/AGE: 81-Apr-1932 81 y.o.  Admit date: 08/07/2017 Discharge date: 08/09/2017  Admission Diagnoses: Right knee arthritis  Discharge Diagnoses: Same Active Problems:   Total knee replacement status   Discharged Condition: good  Hospital Course: Right total knee replacement December 06, 3297 without complication.  Patient had minimal pain.  She was making good strides with his physical therapy but needed skilled nursing placement.  She is discharged today to skilled nursing.  She will follow-up in my office in 1 week.  Consults: None  Significant Diagnostic Studies: radiology: X-Ray: Satisfactory right total knee replacement  Treatments: antibiotics: vancomycin  Discharge Exam: Blood pressure (!) 143/75, pulse 69, temperature 98.2 F (36.8 C), temperature source Oral, resp. rate 16, height 4\' 11"  (1.499 m), weight 45.4 kg (100 lb), SpO2 99 %. Right knee dressing [place].  Neurovascular status good.  Minimal swelling.  Disposition: 03-Skilled Nursing Facility  Discharge Instructions    Call MD for:  persistant nausea and vomiting   Complete by:  As directed    Call MD for:  redness, tenderness, or signs of infection (pain, swelling, redness, odor or green/yellow discharge around incision site)   Complete by:  As directed    Call MD for:  severe uncontrolled pain   Complete by:  As directed    Call MD for:  temperature >100.4   Complete by:  As directed    Diet general   Complete by:  As directed    Discharge instructions   Complete by:  As directed    Bend knee aggressively 4-5 times a day Partial weightbearing 50% right leg Enteric-coated aspirin 2 times daily for 6 weeks Return to office as appointed next week   Increase activity slowly   Complete by:  As directed    Leave dressing on - Keep it clean, Bern[place], and intact until clinic visit   Complete by:  As directed      Allergies as of 08/09/2017   No  Known Allergies     Medication List    STOP taking these medications   acetaminophen 500 MG tablet Commonly known as:  TYLENOL   amLODipine 5 MG tablet Commonly known as:  NORVASC   aspirin 81 MG tablet Replaced by:  aspirin EC 325 MG tablet   ibuprofen 200 MG tablet Commonly known as:  ADVIL,MOTRIN   IRON PO   Lidocaine 4 % Ptch   lisinopril 20 MG tablet Commonly known as:  PRINIVIL,ZESTRIL   multivitamin with minerals Tabs tablet   OVER THE COUNTER MEDICATION   PROBIOTIC PO     TAKE these medications   aspirin EC 325 MG tablet Take 1 tablet (325 mg total) by mouth 2 (two) times daily. Take for 4 weeks Replaces:  aspirin 81 MG tablet   celecoxib 200 MG capsule Commonly known as:  CELEBREX Take 1 capsule (200 mg total) by mouth every 12 (twelve) hours.   gabapentin 100 MG capsule Commonly known as:  NEURONTIN Take 1 capsule (100 mg total) by mouth 3 (three) times daily.   HYDROcodone-acetaminophen 5-325 MG tablet Commonly known as:  NORCO Take 1 tablet by mouth every 6 (six) hours as needed. What changed:  reasons to take this   methocarbamol 500 MG tablet Commonly known as:  ROBAXIN Take 1 tablet (500 mg total) by mouth 3 (three) times daily.   pravastatin 20 MG tablet Commonly known as:  PRAVACHOL Take 1 tablet (20 mg total) by  mouth at bedtime. What changed:  medication strength            Durable Medical Equipment  (From admission, onward)        Start     Ordered   08/09/17 1354  DME 3-in-1  Once     08/09/17 1358   08/07/17 1217  DME Walker rolling  Once    Question:  Patient needs a walker to treat with the following condition  Answer:  Total knee replacement status   08/07/17 1216     Contact information for after-discharge care    Destination    [facility]  SNF .   Service:  Skilled Nursing Contact information: [address] [place] [phone]               Signed: Park Breed 08/09/2017, 1:58 PM

## 2017-08-13 DIAGNOSIS — Z7982 Long term (current) use of aspirin: Secondary | ICD-10-CM | POA: Diagnosis not present

## 2017-08-13 DIAGNOSIS — Z96651 Presence of right artificial knee joint: Secondary | ICD-10-CM | POA: Diagnosis not present

## 2017-08-13 DIAGNOSIS — K580 Irritable bowel syndrome with diarrhea: Secondary | ICD-10-CM | POA: Diagnosis not present

## 2017-08-13 DIAGNOSIS — M1711 Unilateral primary osteoarthritis, right knee: Secondary | ICD-10-CM | POA: Diagnosis not present

## 2017-08-13 DIAGNOSIS — E785 Hyperlipidemia, unspecified: Secondary | ICD-10-CM | POA: Diagnosis not present

## 2017-08-13 DIAGNOSIS — I10 Essential (primary) hypertension: Secondary | ICD-10-CM | POA: Diagnosis not present

## 2017-08-15 ENCOUNTER — Other Ambulatory Visit: Payer: Self-pay | Admitting: *Deleted

## 2017-08-15 NOTE — Patient Outreach (Signed)
[facility]) Care Management  08/15/2017  Erial Fikes [place] 02-Jun-1931 [id number]   Met with patient at facility. Patient reports she is doing well.  This was an elective surgery, she hopes to go home soon.  RNCM reviewed [facility] program.  Patient declines services at this time stating she should be ok once she gets on her feet.  Patient confirms that her daughter is very supportive.   Plan to sign off at this time.  Royetta Crochet. Laymond Purser, RN, BSN, [place] [phone]) Business Cell  [phone]) Toll Free Office

## 2017-08-22 ENCOUNTER — Other Ambulatory Visit
Admission: RE | Admit: 2017-08-22 | Discharge: 2017-08-22 | Disposition: A | Discharge status: Home / Self Care | Payer: PPO | Source: Ambulatory Visit | Attending: Family Medicine | Admitting: Family Medicine

## 2017-08-22 DIAGNOSIS — D649 Anemia, unspecified: Secondary | ICD-10-CM | POA: Diagnosis not present

## 2017-08-22 DIAGNOSIS — R509 Fever, unspecified: Secondary | ICD-10-CM | POA: Insufficient documentation

## 2017-08-22 DIAGNOSIS — M1711 Unilateral primary osteoarthritis, right knee: Secondary | ICD-10-CM | POA: Diagnosis not present

## 2017-08-22 DIAGNOSIS — I10 Essential (primary) hypertension: Secondary | ICD-10-CM | POA: Insufficient documentation

## 2017-08-22 DIAGNOSIS — K580 Irritable bowel syndrome with diarrhea: Secondary | ICD-10-CM | POA: Diagnosis not present

## 2017-08-22 DIAGNOSIS — E785 Hyperlipidemia, unspecified: Secondary | ICD-10-CM | POA: Diagnosis not present

## 2017-08-22 DIAGNOSIS — Z96651 Presence of right artificial knee joint: Secondary | ICD-10-CM | POA: Diagnosis not present

## 2017-08-22 LAB — COMPREHENSIVE METABOLIC PANEL
ALBUMIN: 3.6 g/dL (ref 3.5–5.0)
ALK PHOS: 128 U/L — AB (ref 38–126)
ALT: 34 U/L (ref 14–54)
ANION GAP: 10 (ref 5–15)
AST: 72 U/L — AB (ref 15–41)
BILIRUBIN TOTAL: 0.5 mg/dL (ref 0.3–1.2)
BUN: 12 mg/dL (ref 6–20)
CALCIUM: 10 mg/dL (ref 8.9–10.3)
CO2: 24 mmol/L (ref 22–32)
Chloride: 103 mmol/L (ref 101–111)
Creatinine, Ser: 0.86 mg/dL (ref 0.44–1.00)
GFR calc Af Amer: >60 mL/min (ref >60)
GLUCOSE: 118 mg/dL — AB (ref 65–99)
Potassium: 4.1 mmol/L (ref 3.5–5.1)
Sodium: 137 mmol/L (ref 135–145)
TOTAL PROTEIN: 6 g/dL — AB (ref 6.5–8.1)

## 2017-08-22 LAB — URINALYSIS, COMPLETE (UACMP) WITH MICROSCOPIC
BACTERIA UA: NONE SEEN
Bilirubin Urine: NEGATIVE
Glucose, UA: NEGATIVE mg/dL
Hgb urine dipstick: NEGATIVE
Ketones, ur: NEGATIVE mg/dL
Leukocytes, UA: NEGATIVE
NITRITE: NEGATIVE
PROTEIN: NEGATIVE mg/dL
SPECIFIC GRAVITY, URINE: 1.015 (ref 1.005–1.030)
pH: 5.5 (ref 5.0–8.0)

## 2017-08-22 LAB — CBC WITH DIFFERENTIAL/PLATELET
Basophils Absolute: 0.1 10*3/uL (ref 0–0.1)
Basophils Relative: 1 %
Eosinophils Absolute: 0.7 10*3/uL (ref 0–0.7)
Eosinophils Relative: 7 %
HEMATOCRIT: 33 % — AB (ref 35.0–47.0)
HEMOGLOBIN: 10.7 g/dL — AB (ref 12.0–16.0)
LYMPHS PCT: 10 %
Lymphs Abs: 1 10*3/uL (ref 1.0–3.6)
MCH: 28.8 pg (ref 26.0–34.0)
MCHC: 32.6 g/dL (ref 32.0–36.0)
MCV: 88.5 fL (ref 80.0–100.0)
MONO ABS: 0.6 10*3/uL (ref 0.2–0.9)
MONOS PCT: 6 %
NEUTROS ABS: 7.6 10*3/uL — AB (ref 1.4–6.5)
NEUTROS PCT: 76 %
Platelets: 510 10*3/uL — ABNORMAL HIGH (ref 150–440)
RBC: 3.72 MIL/uL — ABNORMAL LOW (ref 3.80–5.20)
RDW: 15.5 % — AB (ref 11.5–14.5)
WBC: 10 10*3/uL (ref 3.6–11.0)

## 2017-08-24 ENCOUNTER — Inpatient Hospital Stay
Admission: EM | Admit: 2017-08-24 | Discharge: 2017-08-27 | DRG: 536 | Disposition: A | Discharge status: Skilled Nursing Facility | Payer: PPO | Attending: Specialist | Admitting: Specialist

## 2017-08-24 ENCOUNTER — Emergency Department: Payer: PPO

## 2017-08-24 ENCOUNTER — Other Ambulatory Visit: Payer: Self-pay

## 2017-08-24 ENCOUNTER — Encounter: Payer: Self-pay | Admitting: Emergency Medicine

## 2017-08-24 DIAGNOSIS — Z791 Long term (current) use of non-steroidal anti-inflammatories (NSAID): Secondary | ICD-10-CM

## 2017-08-24 DIAGNOSIS — E440 Moderate protein-calorie malnutrition: Secondary | ICD-10-CM | POA: Diagnosis not present

## 2017-08-24 DIAGNOSIS — Z79899 Other long term (current) drug therapy: Secondary | ICD-10-CM | POA: Diagnosis not present

## 2017-08-24 DIAGNOSIS — S79912A Unspecified injury of left hip, initial encounter: Secondary | ICD-10-CM | POA: Diagnosis not present

## 2017-08-24 DIAGNOSIS — M8580 Other specified disorders of bone density and structure, unspecified site: Secondary | ICD-10-CM | POA: Diagnosis not present

## 2017-08-24 DIAGNOSIS — Z7982 Long term (current) use of aspirin: Secondary | ICD-10-CM | POA: Diagnosis not present

## 2017-08-24 DIAGNOSIS — M25552 Pain in left hip: Secondary | ICD-10-CM | POA: Diagnosis not present

## 2017-08-24 DIAGNOSIS — Z96651 Presence of right artificial knee joint: Secondary | ICD-10-CM | POA: Diagnosis present

## 2017-08-24 DIAGNOSIS — K589 Irritable bowel syndrome without diarrhea: Secondary | ICD-10-CM | POA: Diagnosis present

## 2017-08-24 DIAGNOSIS — S72002A Fracture of unspecified part of neck of left femur, initial encounter for closed fracture: Secondary | ICD-10-CM | POA: Diagnosis not present

## 2017-08-24 DIAGNOSIS — E7800 Pure hypercholesterolemia, unspecified: Secondary | ICD-10-CM | POA: Diagnosis present

## 2017-08-24 DIAGNOSIS — Z85828 Personal history of other malignant neoplasm of skin: Secondary | ICD-10-CM | POA: Diagnosis not present

## 2017-08-24 DIAGNOSIS — Y92000 Kitchen of unspecified non-institutional (private) residence as  the place of occurrence of the external cause: Secondary | ICD-10-CM

## 2017-08-24 DIAGNOSIS — E785 Hyperlipidemia, unspecified: Secondary | ICD-10-CM | POA: Diagnosis not present

## 2017-08-24 DIAGNOSIS — Z7401 Bed confinement status: Secondary | ICD-10-CM | POA: Diagnosis not present

## 2017-08-24 DIAGNOSIS — Z6820 Body mass index (BMI) 20.0-20.9, adult: Secondary | ICD-10-CM

## 2017-08-24 DIAGNOSIS — R41841 Cognitive communication deficit: Secondary | ICD-10-CM | POA: Diagnosis not present

## 2017-08-24 DIAGNOSIS — W1830XA Fall on same level, unspecified, initial encounter: Secondary | ICD-10-CM | POA: Diagnosis present

## 2017-08-24 DIAGNOSIS — M84459A Pathological fracture, hip, unspecified, initial encounter for fracture: Secondary | ICD-10-CM | POA: Diagnosis not present

## 2017-08-24 DIAGNOSIS — I4510 Unspecified right bundle-branch block: Secondary | ICD-10-CM | POA: Diagnosis not present

## 2017-08-24 DIAGNOSIS — R2689 Other abnormalities of gait and mobility: Secondary | ICD-10-CM | POA: Diagnosis not present

## 2017-08-24 DIAGNOSIS — S72012A Unspecified intracapsular fracture of left femur, initial encounter for closed fracture: Secondary | ICD-10-CM | POA: Diagnosis not present

## 2017-08-24 DIAGNOSIS — I10 Essential (primary) hypertension: Secondary | ICD-10-CM | POA: Diagnosis present

## 2017-08-24 DIAGNOSIS — Z01818 Encounter for other preprocedural examination: Secondary | ICD-10-CM | POA: Diagnosis not present

## 2017-08-24 DIAGNOSIS — W19XXXA Unspecified fall, initial encounter: Secondary | ICD-10-CM | POA: Diagnosis not present

## 2017-08-24 DIAGNOSIS — S72143D Displaced intertrochanteric fracture of unspecified femur, subsequent encounter for closed fracture with routine healing: Secondary | ICD-10-CM | POA: Diagnosis not present

## 2017-08-24 LAB — CBC WITH DIFFERENTIAL/PLATELET
Basophils Absolute: 0.1 10*3/uL (ref 0–0.1)
Basophils Relative: 1 %
EOS ABS: 0.3 10*3/uL (ref 0–0.7)
EOS PCT: 2 %
HCT: 32.7 % — ABNORMAL LOW (ref 35.0–47.0)
HEMOGLOBIN: 10.7 g/dL — AB (ref 12.0–16.0)
LYMPHS ABS: 1 10*3/uL (ref 1.0–3.6)
LYMPHS PCT: 7 %
MCH: 28.2 pg (ref 26.0–34.0)
MCHC: 32.8 g/dL (ref 32.0–36.0)
MCV: 86 fL (ref 80.0–100.0)
MONOS PCT: 6 %
Monocytes Absolute: 0.9 10*3/uL (ref 0.2–0.9)
Neutro Abs: 13 10*3/uL — ABNORMAL HIGH (ref 1.4–6.5)
Neutrophils Relative %: 84 %
Platelets: 483 10*3/uL — ABNORMAL HIGH (ref 150–440)
RBC: 3.8 MIL/uL (ref 3.80–5.20)
RDW: 15.2 % — ABNORMAL HIGH (ref 11.5–14.5)
WBC: 15.3 10*3/uL — AB (ref 3.6–11.0)

## 2017-08-24 LAB — APTT: aPTT: 30 seconds (ref 24–36)

## 2017-08-24 LAB — BASIC METABOLIC PANEL
Anion gap: 10 (ref 5–15)
BUN: 13 mg/dL (ref 6–20)
CHLORIDE: 107 mmol/L (ref 101–111)
CO2: 22 mmol/L (ref 22–32)
CREATININE: 1.02 mg/dL — AB (ref 0.44–1.00)
Calcium: 10 mg/dL (ref 8.9–10.3)
GFR calc Af Amer: 57 mL/min — ABNORMAL LOW (ref >60)
GFR calc non Af Amer: 49 mL/min — ABNORMAL LOW (ref >60)
Glucose, Bld: 117 mg/dL — ABNORMAL HIGH (ref 65–99)
POTASSIUM: 3.6 mmol/L (ref 3.5–5.1)
Sodium: 139 mmol/L (ref 135–145)

## 2017-08-24 LAB — PROTIME-INR
INR: 1
Prothrombin Time: 13.1 seconds (ref 11.4–15.2)

## 2017-08-24 LAB — URINE CULTURE: Culture: NO GROWTH

## 2017-08-24 LAB — TYPE AND SCREEN
ABO/RH(D): O POS
Antibody Screen: NEGATIVE

## 2017-08-24 NOTE — ED Provider Notes (Signed)
[facility] Emergency Department Provider Note  ____________________________________________  Time seen: Approximately 10:22 PM  I have reviewed the triage vital signs and the nursing notes.   HISTORY  Chief Complaint Hip Pain    HPI Kellie Harris is a 84 y.o. female who is at home trying to get something out of the fridge while holding herself up with her walker when she lost her balance and fell. Complains of pain at the left hip after the fall. She was unable to get up and bear weight so she crawled to the couch and called her son. She has a history of left hip fixation within IM rod due to hip fracture in July 2018. Also recently had a right knee replacement in December 2018. Pain is constant, nonradiating, moderate intensity, worse with movement, better with lying still.     Past Medical History:  Diagnosis Date  . Arthritis    right knee, left shoulder/ OSTEO  . Cancer ([place])    skin   . Degenerative disc disease, lumbar    L4-L5  . Diverticulosis    ITIS  . History of hiatal hernia   . Hydronephrosis    right  . Hypercholesteremia   . Hypertension    CONTROLLED ON MEDS  . IBS (irritable bowel syndrome)   . Knee fracture, right 03/05/2015   DIFFICULTY WITH AMBULATION  . Osteopenia   . Ovarian cyst   . Pancreatic insufficiency   . Wears hearing aid    bilateral     Patient Active Problem List   Diagnosis Date Noted  . Total knee replacement status 08/07/2017  . Closed left hip fracture ([place]) 02/12/2017  . HTN (hypertension) 02/12/2017  . HLD (hyperlipidemia) 02/12/2017     Past Surgical History:  Procedure Laterality Date  . CATARACT EXTRACTION W/PHACO Right 11/02/2015   Procedure: CATARACT EXTRACTION PHACO AND INTRAOCULAR LENS PLACEMENT (IOC);  Surgeon: Leandrew Koyanagi, MD;  Location: [place];  Service: Ophthalmology;  Laterality: Right;  . CATARACT EXTRACTION W/PHACO Left 07/18/2016   Procedure: CATARACT  EXTRACTION PHACO AND INTRAOCULAR LENS PLACEMENT (IOC);  Surgeon: Leandrew Koyanagi, MD;  Location: [place];  Service: Ophthalmology;  Laterality: Left;  . COLONOSCOPY    . FRACTURE SURGERY Left    hip  . INTRAMEDULLARY (IM) NAIL INTERTROCHANTERIC Left 02/12/2017   Procedure: INTRAMEDULLARY (IM) NAIL INTERTROCHANTRIC;  Surgeon: Earnestine Leys, MD;  Location: [facility];  Service: Orthopedics;  Laterality: Left;  . kidney stent    . OOPHORECTOMY Bilateral 2015  . TONSILLECTOMY    . TOTAL KNEE ARTHROPLASTY Right 08/07/2017   Procedure: TOTAL KNEE ARTHROPLASTY;  Surgeon: Earnestine Leys, MD;  Location: [facility];  Service: Orthopedics;  Laterality: Right;     Prior to Admission medications   Medication Sig Start Date End Date Taking? Authorizing Provider  aspirin EC 325 MG tablet Take 1 tablet (325 mg total) by mouth 2 (two) times daily. Take for 4 weeks 08/09/17   Earnestine Leys, MD  celecoxib (CELEBREX) 200 MG capsule Take 1 capsule (200 mg total) by mouth every 12 (twelve) hours. 08/09/17   Earnestine Leys, MD  gabapentin (NEURONTIN) 100 MG capsule Take 1 capsule (100 mg total) by mouth 3 (three) times daily. 08/09/17   Earnestine Leys, MD  HYDROcodone-acetaminophen (NORCO) 5-325 MG tablet Take 1 tablet by mouth every 6 (six) hours as needed. 08/09/17   Earnestine Leys, MD  methocarbamol (ROBAXIN) 500 MG tablet Take 1 tablet (500 mg total) by mouth 3 (three) times  daily. 08/09/17   Earnestine Leys, MD  pravastatin (PRAVACHOL) 20 MG tablet Take 1 tablet (20 mg total) by mouth at bedtime. 08/09/17   Earnestine Leys, MD     Allergies Patient has no known allergies.   Family History  Problem Relation Age of Onset  . Breast cancer Mother 65    Social History Social History   Tobacco Use  . Smoking status: Never Smoker  . Smokeless tobacco: Never Used  Substance Use Topics  . Alcohol use: No  . Drug use: No    Review of Systems  Constitutional:   No fever or chills.  ENT:    No sore throat. No rhinorrhea. Cardiovascular:   No chest pain or syncope. Respiratory:   No dyspnea or cough. Gastrointestinal:   Negative for abdominal pain, vomiting and diarrhea.  Musculoskeletal:   Left hip pain as above All other systems reviewed and are negative except as documented above in ROS and HPI.  ____________________________________________   PHYSICAL EXAM:  VITAL SIGNS: ED Triage Vitals  Enc Vitals Group     BP 08/24/17 2102 (!) 171/98     Pulse Rate 08/24/17 2102 74     Resp 08/24/17 2102 16     Temp 08/24/17 2102 97.7 F (36.5 C)     Temp Source 08/24/17 2102 Oral     SpO2 08/24/17 2102 99 %     Weight 08/24/17 2103 100 lb (45.4 kg)     Height 08/24/17 2103 4\' 11"  (1.499 m)     Head Circumference --      [place] [place] --      Pain Score 08/24/17 2101 6     Pain Loc --      Pain Edu? --      Excl. in [place]? --     Vital signs reviewed, nursing assessments reviewed.   Constitutional:   Alert and oriented. Well appearing and in no distress. Eyes:   No scleral icterus.  EOMI. No nystagmus. No conjunctival pallor. PERRL. ENT   Head:   Normocephalic and atraumatic.   Nose:   No congestion/rhinnorhea.    Mouth/Throat:   MMM, no pharyngeal erythema. No peritonsillar mass.    Neck:   No meningismus. Full ROM. Hematological/Lymphatic/Immunilogical:   No cervical lymphadenopathy. Cardiovascular:   RRR. Symmetric bilateral radial and DP pulses.  No murmurs.  Respiratory:   Normal respiratory effort without tachypnea/retractions. Breath sounds are clear and equal bilaterally. No wheezes/rales/rhonchi. Gastrointestinal:   Soft and nontender. Non distended. There is no CVA tenderness.  No rebound, rigidity, or guarding. Genitourinary:   deferred Musculoskeletal:   Normal range of motion in all extremities. Slight swelling about the right knee, expected postoperatively. No acute inflammatory changes or tenderness. No left hip tenderness, although there is pain  with flexion to about 45. Neurologic:   Normal speech and language.  Motor grossly intact. No gross focal neurologic deficits are appreciated.  Skin:    Skin is warm, [place] and intact. No rash noted.  No petechiae, purpura, or bullae.  ____________________________________________    LABS (pertinent positives/negatives) (all labs ordered are listed, but only abnormal results are displayed) Labs Reviewed  PROTIME-INR  BASIC METABOLIC PANEL  CBC WITH DIFFERENTIAL/PLATELET  APTT  TYPE AND SCREEN   ____________________________________________   EKG  Interpreted by me Normal sinus rhythm rate of 93, left axis, normal intervals. Right bundle-branch block. No acute ischemic changes.  ____________________________________________    RADIOLOGY  Dg Hip Unilat With Pelvis 2-3 Views Left  Result Date: 08/24/2017 CLINICAL DATA:  Left hip pain after fall EXAM: DG HIP (WITH OR WITHOUT PELVIS) 2-3V LEFT COMPARISON:  02/11/2017 FINDINGS: Moderate dextroscoliosis of the lumbar spine. SI joint degenerative change bilaterally. Pubic symphysis and rami are intact. The patient is status post intramedullary rod and screw fixation of left femur for intertrochanteric fracture. Suspected acute step-off deformity/fracture at the subcapital left femur. IMPRESSION: 1. Status post surgical rod and screw fixation of left femur for prior intertrochanteric fracture. 2. Possible step-off deformity/fracture at the left femoral head neck junction Electronically Signed   By: Donavan Foil M.D.   On: 08/24/2017 21:44    ____________________________________________   PROCEDURES Procedures  ____________________________________________    CLINICAL IMPRESSION / ASSESSMENT AND PLAN / ED COURSE  Pertinent labs & imaging results that were available during my care of the patient were reviewed by me and considered in my medical decision making (see chart for details).   Patient presents with left hip pain and  inability to bear weight after a fall. Obtain x-ray of left hip  Clinical Course as of Aug 24 2220  Sat Aug 24, 2017  2158 Xr report and image reviewed by me. Reveals subcapital left hip fx around hardware. D/w ortho Dr. Harlow Mares. Will plan for hospitalist to admit, ortho to eval tomorrow.  [PS]    Clinical Course User Index [PS] Carrie Mew, MD     ____________________________________________   FINAL CLINICAL IMPRESSION(S) / ED DIAGNOSES    Final diagnoses:  Closed left hip fracture, initial encounter [facility])       Portions of this note were generated with dragon dictation software. Dictation errors may occur despite best attempts at proofreading.    Carrie Mew, MD 08/24/17 [phone]

## 2017-08-24 NOTE — ED Triage Notes (Signed)
Pt arrives via [facility] from home with c/o left hip pain due to a mechanical fall. Per pt, she was walking with her walker and became tangled up and fell onto her left hip. Pt left rehab yesterday for a right knee replacement that she had done. Pt reports that she broke the same hip on February 11, 2017. Pt is in NAD.

## 2017-08-25 ENCOUNTER — Inpatient Hospital Stay: Payer: PPO

## 2017-08-25 ENCOUNTER — Other Ambulatory Visit: Payer: Self-pay

## 2017-08-25 DIAGNOSIS — Y92000 Kitchen of unspecified non-institutional (private) residence as  the place of occurrence of the external cause: Secondary | ICD-10-CM | POA: Diagnosis not present

## 2017-08-25 DIAGNOSIS — Z6820 Body mass index (BMI) 20.0-20.9, adult: Secondary | ICD-10-CM | POA: Diagnosis not present

## 2017-08-25 DIAGNOSIS — S72002A Fracture of unspecified part of neck of left femur, initial encounter for closed fracture: Secondary | ICD-10-CM | POA: Diagnosis present

## 2017-08-25 DIAGNOSIS — Z96651 Presence of right artificial knee joint: Secondary | ICD-10-CM | POA: Diagnosis present

## 2017-08-25 DIAGNOSIS — I10 Essential (primary) hypertension: Secondary | ICD-10-CM | POA: Diagnosis present

## 2017-08-25 DIAGNOSIS — Z7982 Long term (current) use of aspirin: Secondary | ICD-10-CM | POA: Diagnosis not present

## 2017-08-25 DIAGNOSIS — Z85828 Personal history of other malignant neoplasm of skin: Secondary | ICD-10-CM | POA: Diagnosis not present

## 2017-08-25 DIAGNOSIS — E440 Moderate protein-calorie malnutrition: Secondary | ICD-10-CM | POA: Diagnosis present

## 2017-08-25 DIAGNOSIS — E7800 Pure hypercholesterolemia, unspecified: Secondary | ICD-10-CM | POA: Diagnosis present

## 2017-08-25 DIAGNOSIS — S72012A Unspecified intracapsular fracture of left femur, initial encounter for closed fracture: Secondary | ICD-10-CM | POA: Diagnosis present

## 2017-08-25 DIAGNOSIS — K589 Irritable bowel syndrome without diarrhea: Secondary | ICD-10-CM | POA: Diagnosis present

## 2017-08-25 DIAGNOSIS — W1830XA Fall on same level, unspecified, initial encounter: Secondary | ICD-10-CM | POA: Diagnosis present

## 2017-08-25 DIAGNOSIS — Z79899 Other long term (current) drug therapy: Secondary | ICD-10-CM | POA: Diagnosis not present

## 2017-08-25 DIAGNOSIS — M8580 Other specified disorders of bone density and structure, unspecified site: Secondary | ICD-10-CM | POA: Diagnosis present

## 2017-08-25 DIAGNOSIS — Z791 Long term (current) use of non-steroidal anti-inflammatories (NSAID): Secondary | ICD-10-CM | POA: Diagnosis not present

## 2017-08-25 LAB — BASIC METABOLIC PANEL
Anion gap: 6 (ref 5–15)
BUN: 11 mg/dL (ref 6–20)
CALCIUM: 9.7 mg/dL (ref 8.9–10.3)
CO2: 26 mmol/L (ref 22–32)
Chloride: 109 mmol/L (ref 101–111)
Creatinine, Ser: 0.91 mg/dL (ref 0.44–1.00)
GFR calc Af Amer: >60 mL/min (ref >60)
GFR, EST NON AFRICAN AMERICAN: 56 mL/min — AB (ref >60)
Glucose, Bld: 101 mg/dL — ABNORMAL HIGH (ref 65–99)
POTASSIUM: 3.9 mmol/L (ref 3.5–5.1)
SODIUM: 141 mmol/L (ref 135–145)

## 2017-08-25 LAB — CBC
HEMATOCRIT: 30.3 % — AB (ref 35.0–47.0)
Hemoglobin: 9.8 g/dL — ABNORMAL LOW (ref 12.0–16.0)
MCH: 28.5 pg (ref 26.0–34.0)
MCHC: 32.5 g/dL (ref 32.0–36.0)
MCV: 87.5 fL (ref 80.0–100.0)
PLATELETS: 397 10*3/uL (ref 150–440)
RBC: 3.46 MIL/uL — ABNORMAL LOW (ref 3.80–5.20)
RDW: 15.4 % — AB (ref 11.5–14.5)
WBC: 12.7 10*3/uL — AB (ref 3.6–11.0)

## 2017-08-25 LAB — SURGICAL PCR SCREEN
MRSA, PCR: NEGATIVE
STAPHYLOCOCCUS AUREUS: NEGATIVE

## 2017-08-25 MED ORDER — GABAPENTIN 100 MG PO CAPS
100.0000 mg | ORAL_CAPSULE | Freq: Three times a day (TID) | ORAL | Status: DC
  Administered 2017-08-25 – 2017-08-27 (×7): 100 mg via ORAL
  Filled 2017-08-25 (×7): qty 1

## 2017-08-25 MED ORDER — ONDANSETRON HCL 4 MG/2ML IJ SOLN: 4.0000 mg | Freq: Four times a day (QID) | INTRAMUSCULAR | Status: DC | PRN

## 2017-08-25 MED ORDER — AMLODIPINE BESYLATE 5 MG PO TABS
5.0000 mg | ORAL_TABLET | Freq: Every day | ORAL | Status: DC
  Administered 2017-08-25 – 2017-08-27 (×3): 5 mg via ORAL
  Filled 2017-08-25 (×3): qty 1

## 2017-08-25 MED ORDER — OXYCODONE HCL 5 MG PO TABS
5.0000 mg | ORAL_TABLET | ORAL | Status: DC | PRN
  Administered 2017-08-25 – 2017-08-26 (×3): 5 mg via ORAL
  Filled 2017-08-25 (×3): qty 1

## 2017-08-25 MED ORDER — ONDANSETRON HCL 4 MG PO TABS: 4.0000 mg | ORAL_TABLET | Freq: Four times a day (QID) | ORAL | Status: DC | PRN

## 2017-08-25 MED ORDER — PRAVASTATIN SODIUM 20 MG PO TABS
20.0000 mg | ORAL_TABLET | Freq: Every day | ORAL | Status: DC
  Administered 2017-08-25 – 2017-08-26 (×3): 20 mg via ORAL
  Filled 2017-08-25 (×3): qty 1

## 2017-08-25 MED ORDER — LISINOPRIL 20 MG PO TABS
20.0000 mg | ORAL_TABLET | Freq: Two times a day (BID) | ORAL | Status: DC
  Administered 2017-08-25 – 2017-08-27 (×5): 20 mg via ORAL
  Filled 2017-08-25 (×6): qty 1

## 2017-08-25 MED ORDER — ACETAMINOPHEN 650 MG RE SUPP: 650.0000 mg | Freq: Four times a day (QID) | RECTAL | Status: DC | PRN

## 2017-08-25 MED ORDER — MORPHINE SULFATE (PF) 2 MG/ML IV SOLN
2.0000 mg | Freq: Once | INTRAVENOUS | Status: AC
Start: 2017-08-25 — End: 2017-08-25
  Administered 2017-08-25: 2 mg via INTRAVENOUS
  Filled 2017-08-25: qty 1

## 2017-08-25 MED ORDER — ONDANSETRON HCL 4 MG/2ML IJ SOLN
4.0000 mg | Freq: Once | INTRAMUSCULAR | Status: AC
  Administered 2017-08-25: 4 mg via INTRAVENOUS
  Filled 2017-08-25: qty 2

## 2017-08-25 MED ORDER — ACETAMINOPHEN 325 MG PO TABS
650.0000 mg | ORAL_TABLET | Freq: Four times a day (QID) | ORAL | Status: DC | PRN
  Administered 2017-08-27: 650 mg via ORAL
  Filled 2017-08-25: qty 2

## 2017-08-25 MED ORDER — METHOCARBAMOL 500 MG PO TABS
500.0000 mg | ORAL_TABLET | Freq: Three times a day (TID) | ORAL | Status: DC
  Administered 2017-08-25 – 2017-08-27 (×7): 500 mg via ORAL
  Filled 2017-08-25 (×7): qty 1

## 2017-08-25 NOTE — Progress Notes (Signed)
New Admission Note:   Arrival Method: per stretcher from ED, pt came from [facility] rehab after a fall Mental Orientation: alert and oriented X4 Telemetry: none ordered Assessment: Completed Skin: warm, [place], with ecchymosis noted on the left lower leg and left hip. Prophylactic sacral foam applied. IV: G20 on the right arm with transparent dressing, intact Pain: pt rated pain as "not so bad". Pt offered pain medicine but declined. Pt sated she received pain medicine in ED. Safety Measures: Safety Fall Prevention Plan has been given and discussed Admission: Completed 1A Orientation: Patient has been oriented to the room, unit and staff.  Family: no family member/s present at bedside this time  Orders have been reviewed and implemented. Call light has been placed within reach, yellow socks on, fall risk armband applied, pt on low bed and bed alarm activated. Will continue to monitor patient.   Georgeanna Harrison, RN

## 2017-08-25 NOTE — Progress Notes (Signed)
Verbal order received by MD Harlow Mares to advance pt to regular diet. Pt is not having surgery today.

## 2017-08-25 NOTE — NC FL2 (Signed)
[place] LEVEL OF CARE SCREENING TOOL     IDENTIFICATION  Patient Name: Kellie Harris Birthdate: 04/17/1931 Sex: female Admission Date (Current Location): 08/24/2017  [place] and [place] Number:  Engineering geologist and Address:  [facility], [address], [place], [place] [address]      Provider Number: [id number]  Attending Physician Name and Address:  Epifanio Lesches, MD  Relative Name and Phone Number:  [name] Day (Daughter) [phone]    Current Level of Care: Hospital Recommended Level of Care: [place] Prior Approval Number:    Date Approved/Denied:   PASRR Number: [id number]  Discharge Plan: SNF    Current Diagnoses: Patient Active Problem List   Diagnosis Date Noted  . Total knee replacement status 08/07/2017  . Closed left hip fracture ([place]) 02/12/2017  . HTN (hypertension) 02/12/2017  . HLD (hyperlipidemia) 02/12/2017    Orientation RESPIRATION BLADDER Height & Weight     Self, Time, Situation, Place  Normal Continent Weight: 100 lb (45.4 kg) Height:  4\' 11"  (149.9 cm)  BEHAVIORAL SYMPTOMS/MOOD NEUROLOGICAL BOWEL NUTRITION STATUS      Continent Diet(Regular)  AMBULATORY STATUS COMMUNICATION OF NEEDS Skin   Extensive Assist Verbally Normal                       Personal Care Assistance Level of Assistance  Bathing, Feeding, Dressing Bathing Assistance: Limited assistance Feeding assistance: Independent Dressing Assistance: Limited assistance     Functional Limitations Info    Sight Info: Adequate Hearing Info: Adequate Speech Info: Adequate    SPECIAL CARE FACTORS FREQUENCY  PT (By licensed PT)     PT Frequency: 5              Contractures Contractures Info: Not present    Additional Factors Info  Code Status, Allergies Code Status Info: Full Allergies Info: No known allergies           Current Medications (08/25/2017):  This is the current hospital active  medication list Current Facility-Administered Medications  Medication Dose Route Frequency Provider Last Rate Last Dose  . acetaminophen (TYLENOL) tablet 650 mg  650 mg Oral Q6H PRN Lance Coon, MD       Or  . acetaminophen (TYLENOL) suppository 650 mg  650 mg Rectal Q6H PRN Lance Coon, MD      . amLODipine (NORVASC) tablet 5 mg  5 mg Oral Daily Lance Coon, MD   5 mg at 08/25/17 1029  . gabapentin (NEURONTIN) capsule 100 mg  100 mg Oral TID Lance Coon, MD   100 mg at 08/25/17 1027  . lisinopril (PRINIVIL,ZESTRIL) tablet 20 mg  20 mg Oral BID Lance Coon, MD   20 mg at 08/25/17 1030  . methocarbamol (ROBAXIN) tablet 500 mg  500 mg Oral TID Lance Coon, MD   500 mg at 08/25/17 1028  . ondansetron (ZOFRAN) tablet 4 mg  4 mg Oral Q6H PRN Lance Coon, MD       Or  . ondansetron [facility]) injection 4 mg  4 mg Intravenous Q6H PRN Lance Coon, MD      . oxyCODONE (Oxy IR/ROXICODONE) immediate release tablet 5 mg  5 mg Oral Q4H PRN Lance Coon, MD   5 mg at 08/25/17 4010  . pravastatin (PRAVACHOL) tablet 20 mg  20 mg Oral Corwin Levins, MD   20 mg at 08/25/17 0234     Discharge Medications: Please see discharge summary for a list  of discharge medications.  Relevant Imaging Results:  Relevant Lab Results:   Additional Information SS# [id number]  Zettie Pho, LCSW

## 2017-08-25 NOTE — H&P (Signed)
[facility] [place] NAME: Kellie Harris    MR#:  [id number]  DATE OF BIRTH:  07/31/1931  DATE OF ADMISSION:  08/24/2017  PRIMARY CARE PHYSICIAN: Dion Body, MD   REQUESTING/REFERRING PHYSICIAN: Joni Fears, MD  CHIEF COMPLAINT:   Chief Complaint  Patient presents with  . Hip Pain    HISTORY OF PRESENT ILLNESS:  Kellie Harris  is a 84 y.o. female who presents with mechanical fall at home and subsequent left hip fracture.  Patient had that same hip repaired several months ago after fracture.  Hospitalist were called for admission with orthopedic surgery to consult  PAST MEDICAL HISTORY:   Past Medical History:  Diagnosis Date  . Arthritis    right knee, left shoulder/ OSTEO  . Cancer ([place])    skin   . Degenerative disc disease, lumbar    L4-L5  . Diverticulosis    ITIS  . History of hiatal hernia   . Hydronephrosis    right  . Hypercholesteremia   . Hypertension    CONTROLLED ON MEDS  . IBS (irritable bowel syndrome)   . Knee fracture, right 03/05/2015   DIFFICULTY WITH AMBULATION  . Osteopenia   . Ovarian cyst   . Pancreatic insufficiency   . Wears hearing aid    bilateral    PAST SURGICAL HISTORY:   Past Surgical History:  Procedure Laterality Date  . CATARACT EXTRACTION W/PHACO Right 11/02/2015   Procedure: CATARACT EXTRACTION PHACO AND INTRAOCULAR LENS PLACEMENT (IOC);  Surgeon: Leandrew Koyanagi, MD;  Location: [facility];  Service: Ophthalmology;  Laterality: Right;  . CATARACT EXTRACTION W/PHACO Left 07/18/2016   Procedure: CATARACT EXTRACTION PHACO AND INTRAOCULAR LENS PLACEMENT (IOC);  Surgeon: Leandrew Koyanagi, MD;  Location: [facility];  Service: Ophthalmology;  Laterality: Left;  . COLONOSCOPY    . FRACTURE SURGERY Left    hip  . INTRAMEDULLARY (IM) NAIL INTERTROCHANTERIC Left 02/12/2017   Procedure: INTRAMEDULLARY (IM) NAIL INTERTROCHANTRIC;  Surgeon: Earnestine Leys, MD;   Location: [facility];  Service: Orthopedics;  Laterality: Left;  . kidney stent    . OOPHORECTOMY Bilateral 2015  . TONSILLECTOMY    . TOTAL KNEE ARTHROPLASTY Right 08/07/2017   Procedure: TOTAL KNEE ARTHROPLASTY;  Surgeon: Earnestine Leys, MD;  Location: [facility];  Service: Orthopedics;  Laterality: Right;    SOCIAL HISTORY:   Social History   Tobacco Use  . Smoking status: Never Smoker  . Smokeless tobacco: Never Used  Substance Use Topics  . Alcohol use: No    FAMILY HISTORY:   Family History  Problem Relation Age of Onset  . Breast cancer Mother 57    DRUG ALLERGIES:  No Known Allergies  MEDICATIONS AT HOME:   Prior to Admission medications   Medication Sig Start Date End Date Taking? Authorizing Provider  amLODipine (NORVASC) 5 MG tablet Take 5 mg by mouth daily.   Yes [provider]  aspirin EC 325 MG tablet Take 1 tablet (325 mg total) by mouth 2 (two) times daily. Take for 4 weeks 08/09/17  Yes Earnestine Leys, MD  celecoxib (CELEBREX) 200 MG capsule Take 1 capsule (200 mg total) by mouth every 12 (twelve) hours. 08/09/17  Yes Earnestine Leys, MD  gabapentin (NEURONTIN) 100 MG capsule Take 1 capsule (100 mg total) by mouth 3 (three) times daily. 08/09/17  Yes Earnestine Leys, MD  HYDROcodone-acetaminophen (NORCO) 5-325 MG tablet Take 1 tablet by mouth every 6 (six) hours as needed. Patient taking differently:  Take 1 tablet by mouth every 6 (six) hours as needed for moderate pain.  08/09/17  Yes Earnestine Leys, MD  lisinopril (PRINIVIL,ZESTRIL) 20 MG tablet Take 20 mg by mouth 2 (two) times daily.   Yes [provider]  methocarbamol (ROBAXIN) 500 MG tablet Take 1 tablet (500 mg total) by mouth 3 (three) times daily. 08/09/17  Yes Earnestine Leys, MD  pravastatin (PRAVACHOL) 20 MG tablet Take 1 tablet (20 mg total) by mouth at bedtime. 08/09/17  Yes Earnestine Leys, MD    REVIEW OF SYSTEMS:  Review of Systems  Constitutional: Negative for chills, fever,  malaise/fatigue and weight loss.  HENT: Negative for ear pain, hearing loss and tinnitus.   Eyes: Negative for blurred vision, double vision, pain and redness.  Respiratory: Negative for cough, hemoptysis and shortness of breath.   Cardiovascular: Negative for chest pain, palpitations, orthopnea and leg swelling.  Gastrointestinal: Negative for abdominal pain, constipation, diarrhea, nausea and vomiting.  Genitourinary: Negative for dysuria, frequency and hematuria.  Musculoskeletal: Positive for joint pain (left hip). Negative for back pain and neck pain.  Skin:       No acne, rash, or lesions  Neurological: Negative for dizziness, tremors, focal weakness and weakness.  Endo/Heme/Allergies: Negative for polydipsia. Does not bruise/bleed easily.  Psychiatric/Behavioral: Negative for depression. The patient is not nervous/anxious and does not have insomnia.      VITAL SIGNS:   Vitals:   08/24/17 2102 08/24/17 2103 08/24/17 2200 08/24/17 2330  BP: (!) 171/98  (!) 165/82 (!) 150/73  Pulse: 74  84 90  Resp: 16   20  Temp: 97.7 F (36.5 C)     TempSrc: Oral     SpO2: 99%  93% 93%  Weight:  45.4 kg (100 lb)    Height:  4\' 11"  (1.499 m)     Wt Readings from Last 3 Encounters:  08/24/17 45.4 kg (100 lb)  08/07/17 45.4 kg (100 lb)  02/12/17 58 kg (127 lb 12.8 oz)    PHYSICAL EXAMINATION:  Physical Exam  Vitals reviewed. Constitutional: She is oriented to person, place, and time. She appears well-developed and well-nourished. No distress.  HENT:  Head: Normocephalic and atraumatic.  Mouth/Throat: Oropharynx is clear and moist.  Eyes: Conjunctivae and EOM are normal. Pupils are equal, round, and reactive to light. No scleral icterus.  Neck: Normal range of motion. Neck supple. No JVD present. No thyromegaly present.  Cardiovascular: Normal rate, regular rhythm and intact distal pulses. Exam reveals no gallop and no friction rub.  No murmur heard. Respiratory: Effort normal and  breath sounds normal. No respiratory distress. She has no wheezes. She has no rales.  GI: Soft. Bowel sounds are normal. She exhibits no distension. There is no tenderness.  Musculoskeletal: She exhibits tenderness (left hip). She exhibits no edema.  No arthritis, no gout  Lymphadenopathy:    She has no cervical adenopathy.  Neurological: She is alert and oriented to person, place, and time. No cranial nerve deficit.  No dysarthria, no aphasia  Skin: Skin is warm and [place]. No rash noted. No erythema.  Psychiatric: She has a normal mood and affect. Her behavior is normal. Judgment and thought content normal.    LABORATORY PANEL:   CBC Recent Labs  Lab 08/24/17 2226  WBC 15.3*  HGB 10.7*  HCT 32.7*  PLT 483*   ------------------------------------------------------------------------------------------------------------------  Chemistries  Recent Labs  Lab 08/22/17 1530 08/24/17 2226  NA 137 139  K 4.1 3.6  CL 103 107  CO2 24 22  GLUCOSE 118* 117*  BUN 12 13  CREATININE 0.86 1.02*  CALCIUM 10.0 10.0  AST 72*  --   ALT 34  --   ALKPHOS 128*  --   BILITOT 0.5  --    ------------------------------------------------------------------------------------------------------------------  Cardiac Enzymes No results for input(s): TROPONINI in the last 168 hours. ------------------------------------------------------------------------------------------------------------------  RADIOLOGY:  Dg Chest Portable 1 View  Result Date: 08/24/2017 CLINICAL DATA:  Preop EXAM: PORTABLE CHEST 1 VIEW COMPARISON:  07/08/2014 FINDINGS: Mild cardiomegaly. Aortic atherosclerosis. No acute consolidation or effusion. Negative for pneumothorax. Scoliosis of the spine. IMPRESSION: No active disease.  Mild cardiomegaly. Electronically Signed   By: Donavan Foil M.D.   On: 08/24/2017 22:45   Dg Hip Unilat With Pelvis 2-3 Views Left  Result Date: 08/24/2017 CLINICAL DATA:  Left hip pain after fall EXAM:  DG HIP (WITH OR WITHOUT PELVIS) 2-3V LEFT COMPARISON:  02/11/2017 FINDINGS: Moderate dextroscoliosis of the lumbar spine. SI joint degenerative change bilaterally. Pubic symphysis and rami are intact. The patient is status post intramedullary rod and screw fixation of left femur for intertrochanteric fracture. Suspected acute step-off deformity/fracture at the subcapital left femur. IMPRESSION: 1. Status post surgical rod and screw fixation of left femur for prior intertrochanteric fracture. 2. Possible step-off deformity/fracture at the left femoral head neck junction Electronically Signed   By: Donavan Foil M.D.   On: 08/24/2017 21:44    EKG:   Orders placed or performed during the hospital encounter of 08/24/17  . ED EKG  . ED EKG  . EKG 12-Lead  . EKG 12-Lead    IMPRESSION AND PLAN:  Principal Problem:   Closed left hip fracture [facility]) -orthopedic surgery consult, defer to their recommendation for further treatment Active Problems:   HTN (hypertension) -continue home meds   HLD (hyperlipidemia) -home dose anti-lipids  All the records are reviewed and case discussed with ED provider. Management plans discussed with the patient and/or family.  DVT PROPHYLAXIS: Mechanical only  GI PROPHYLAXIS: None  ADMISSION STATUS: Inpatient  CODE STATUS: Full Code Status History    Date Active Date Inactive Code Status Order ID Comments User Context   08/07/2017 12:17 08/09/2017 19:54 Full Code [id number]  Earnestine Leys, MD Inpatient   02/12/2017 01:40 02/14/2017 20:23 Full Code [id number]  Lance Coon, MD ED    Advance Directive Documentation     Most Recent Value  Type of Advance Directive  Living will, Healthcare Power of Attorney  Pre-existing out of facility DNR order (yellow form or pink MOST form)  No data  "MOST" Form in Place?  No data      TOTAL TIME TAKING CARE OF THIS PATIENT: 45 minutes.   Alecea Trego [place] 08/25/2017, 12:03 AM  [facility]  Office   [phone]  CC: Primary care physician; Dion Body, MD  Note:  This document was prepared using Dragon voice recognition software and may include unintentional dictation errors.

## 2017-08-25 NOTE — Evaluation (Signed)
Physical Therapy Evaluation Patient Details Name: JUAN OLTHOFF MRN: [id number] DOB: 12-Oct-1930 Today's Date: 08/25/2017   History of Present Illness  Pt admitted s/p L hip fracture after a fall.  PMH includes R knee TKA, DJD, CA, pancreatic insufficiency, ovarian cyst, osteopenia, pancreatic insufficiency, arthritis and IBS.  Clinical Impression  Pt is an 84 year old female who lives in a one story home alone. She recently was DC from hospital to SNF following a R TKA and sustained a fall, which pt reports happened when she got her feet "tangled up" in her rollator.  Pt was able to ambulate 20 ft in rm before reporting the need to sit due to increased pain.  Pt required min A to initiate STS with RW but was able to complete the transfer on her own.  Pt is comfortable using a RW and states that she will use this instead of her rollator following DC.  Pt presented with overall good strength of BUE and R LE and decreased strength of L LE compared to R.  Pt appeared confused when PT questioned pt concerning sensation of L LE.  Pt presented with expressive difficulties, confusing words and jumbling sentences and stating that she knew that she appeared confused but that she was not.  Pt will continue to benefit from skilled PT with focus on strength, mobility, proper use of AD and pain management.    Follow Up Recommendations SNF    Equipment Recommendations       Recommendations for Other Services       Precautions / Restrictions Precautions Precautions: Fall Restrictions Weight Bearing Restrictions: Yes RLE Weight Bearing: Weight bearing as tolerated LLE Weight Bearing: Touchdown weight bearing      Mobility  Bed Mobility Overal bed mobility: Modified Independent Bed Mobility: Supine to Sit     Supine to sit: Modified independent (Device/Increase time)     General bed mobility comments: Uses bed rail for mobility.  Transfers Overall transfer level: Needs assistance Equipment used:  Rolling walker (2 wheeled) Transfers: Sit to/from Stand Sit to Stand: Min assist         General transfer comment: Min A for initiation of STS due to pain.  Pt used RW and was able to completely transfer to standing upright on her own following assistance with initiation.  Ambulation/Gait Ambulation/Gait assistance: Modified independent (Device/Increase time) Ambulation Distance (Feet): 20 Feet Assistive device: Rolling walker (2 wheeled)     Gait velocity interpretation: Below normal speed for age/gender General Gait Details: Pt verbalized that she did not feel comfortable ambulating farther due to pain.  Pt presented with a slow gait pattern, low foot clearance and decreased hip and knee flexion of L LE.  Pt verbalized understanding of PWB status.  Stairs            Wheelchair Mobility    Modified Rankin (Stroke Patients Only)       Balance Overall balance assessment: Modified Independent Sitting-balance support: Single extremity supported       Standing balance support: Bilateral upper extremity supported   Standing balance comment: Requires use of RW for balance due to pain and WB status.  Pt has also fallen 3x in the past year, per pt.                             Pertinent Vitals/Pain Pain Assessment: 0-10 Pain Score: 8  Pain Location: L hip during activity and 5/10 at rest.  Pain Intervention(s): Limited activity within patient's tolerance    Home Living Family/patient expects to be discharged to:: Skilled nursing facility Living Arrangements: Alone Available Help at Discharge: Family(Pt stated that her daughter can help her sometimes but was not sure if she would be available all the time.) Type of Home: House Home Access: Stairs to enter Entrance Stairs-Rails: Can reach both Entrance Stairs-Number of Steps: 1 Home Layout: One level Home Equipment: Walker - 2 wheels;Walker - 4 wheels(Pt states that she normally uses her Rollator but that  this is what caused her to fall.)      Prior Function Level of Independence: Independent with assistive device(s)         Comments: Pt states that she uses a shopping cart for support when grocery shopping.     Hand Dominance   Dominant Hand: Right    Extremity/Trunk Assessment   Upper Extremity Assessment Upper Extremity Assessment: Overall WFL for tasks assessed    Lower Extremity Assessment Lower Extremity Assessment: Generalized weakness(Pt presented with L weakness compared to R due to pain.)    Cervical / Trunk Assessment Cervical / Trunk Assessment: Normal  Communication   Communication: Expressive difficulties;HOH(Pt presented as confused and having word finding difficulties.)  Cognition Arousal/Alertness: Awake/alert Behavior During Therapy: Restless Overall Cognitive Status: Impaired/Different from baseline Area of Impairment: Problem solving                             Problem Solving: Slow processing;Decreased initiation General Comments: Pt presented with word finding difficulties and delayed speech, stopping intermittently to state that she was not confused though she shounded like she was.      General Comments      Exercises     Assessment/Plan    PT Assessment Patient needs continued PT services  PT Problem List Decreased strength;Decreased activity tolerance;Decreased balance;Decreased mobility;Pain       PT Treatment Interventions DME instruction;Gait training;Stair training;Functional mobility training;Therapeutic activities;Therapeutic exercise;Balance training;Patient/family education    PT Goals (Current goals can be found in the Care Plan section)  Acute Rehab PT Goals Patient Stated Goal: to continue to get stronger and decrease fall risk. PT Goal Formulation: With patient Time For Goal Achievement: 09/08/17 Potential to Achieve Goals: Good    Frequency BID   Barriers to discharge        Co-evaluation                AM-PAC PT "6 Clicks" Daily Activity  Outcome Measure Difficulty turning over in bed (including adjusting bedclothes, sheets and blankets)?: A Little Difficulty moving from lying on back to sitting on the side of the bed? : A Little Difficulty sitting down on and standing up from a chair with arms (e.g., wheelchair, bedside commode, etc,.)?: A Little Help needed moving to and from a bed to chair (including a wheelchair)?: A Little Help needed walking in hospital room?: A Little Help needed climbing 3-5 steps with a railing? : A Little 6 Click Score: 18    End of Session Equipment Utilized During Treatment: Gait belt Activity Tolerance: Patient limited by pain Patient left: in chair;with call bell/phone within reach;with chair alarm set Nurse Communication: Mobility status PT Visit Diagnosis: Unsteadiness on feet (R26.81);Other abnormalities of gait and mobility (R26.89);Repeated falls (R29.6);Muscle weakness (generalized) (M62.81);History of falling (Z91.81);Pain Pain - Right/Left: Left Pain - part of body: Hip    Time: 1435-1500 PT Time Calculation (min) (ACUTE ONLY): 25 min  Charges:   PT Evaluation $PT Eval Low Complexity: 1 Low     PT G Codes:   PT G-Codes **NOT FOR INPATIENT CLASS** Functional Assessment Tool Used: AM-PAC 6 Clicks Basic Mobility Functional Limitation: Mobility: Walking and moving around Mobility: Walking and Moving Around Current Status (V7858): At least 60 percent but less than 80 percent impaired, limited or restricted Mobility: Walking and Moving Around Goal Status [id number]): At least 1 percent but less than 20 percent impaired, limited or restricted    Roxanne Gates, PT, DPT   Roxanne Gates 08/25/2017, 3:18 PM  Late entry not; corrected and entered 08/27/17 Roxanne Gates, PT, DPT

## 2017-08-25 NOTE — Consult Note (Signed)
ORTHOPAEDIC CONSULTATION  REQUESTING PHYSICIAN: Epifanio Lesches, MD  Chief Complaint: left hip pain  HPI: ANALEYA LUALLEN is a 84 y.o. female who complains of left hip pain after a fall at home. Please see H&P and ED notes. She denies any numbness, tingling or constitutional symptoms.  Past Medical History:  Diagnosis Date  . Arthritis    right knee, left shoulder/ OSTEO  . Cancer ([place])    skin   . Degenerative disc disease, lumbar    L4-L5  . Diverticulosis    ITIS  . History of hiatal hernia   . Hydronephrosis    right  . Hypercholesteremia   . Hypertension    CONTROLLED ON MEDS  . IBS (irritable bowel syndrome)   . Knee fracture, right 03/05/2015   DIFFICULTY WITH AMBULATION  . Osteopenia   . Ovarian cyst   . Pancreatic insufficiency   . Wears hearing aid    bilateral   Past Surgical History:  Procedure Laterality Date  . CATARACT EXTRACTION W/PHACO Right 11/02/2015   Procedure: CATARACT EXTRACTION PHACO AND INTRAOCULAR LENS PLACEMENT (IOC);  Surgeon: Leandrew Koyanagi, MD;  Location: [facility];  Service: Ophthalmology;  Laterality: Right;  . CATARACT EXTRACTION W/PHACO Left 07/18/2016   Procedure: CATARACT EXTRACTION PHACO AND INTRAOCULAR LENS PLACEMENT (IOC);  Surgeon: Leandrew Koyanagi, MD;  Location: [facility];  Service: Ophthalmology;  Laterality: Left;  . COLONOSCOPY    . FRACTURE SURGERY Left    hip  . INTRAMEDULLARY (IM) NAIL INTERTROCHANTERIC Left 02/12/2017   Procedure: INTRAMEDULLARY (IM) NAIL INTERTROCHANTRIC;  Surgeon: Earnestine Leys, MD;  Location: [facility];  Service: Orthopedics;  Laterality: Left;  . kidney stent    . OOPHORECTOMY Bilateral 2015  . TONSILLECTOMY    . TOTAL KNEE ARTHROPLASTY Right 08/07/2017   Procedure: TOTAL KNEE ARTHROPLASTY;  Surgeon: Earnestine Leys, MD;  Location: [facility];  Service: Orthopedics;  Laterality: Right;   Social History   Socioeconomic History  . Marital status: Widowed    Spouse name:  None  . Number of children: None  . Years of education: None  . Highest education level: None  Social Needs  . Financial resource strain: None  . Food insecurity - worry: None  . Food insecurity - inability: None  . Transportation needs - medical: None  . Transportation needs - non-medical: None  Occupational History  . None  Tobacco Use  . Smoking status: Never Smoker  . Smokeless tobacco: Never Used  Substance and Sexual Activity  . Alcohol use: No  . Drug use: No  . Sexual activity: None  Other Topics Concern  . None  Social History Narrative  . None   Family History  Problem Relation Age of Onset  . Breast cancer Mother 77   No Known Allergies Prior to Admission medications   Medication Sig Start Date End Date Taking? Authorizing Provider  amLODipine (NORVASC) 5 MG tablet Take 5 mg by mouth daily.   Yes [provider]  aspirin EC 325 MG tablet Take 1 tablet (325 mg total) by mouth 2 (two) times daily. Take for 4 weeks 08/09/17  Yes Earnestine Leys, MD  celecoxib (CELEBREX) 200 MG capsule Take 1 capsule (200 mg total) by mouth every 12 (twelve) hours. 08/09/17  Yes Earnestine Leys, MD  gabapentin (NEURONTIN) 100 MG capsule Take 1 capsule (100 mg total) by mouth 3 (three) times daily. 08/09/17  Yes Earnestine Leys, MD  HYDROcodone-acetaminophen (NORCO) 5-325 MG tablet Take 1 tablet by mouth every 6 (six)  hours as needed. Patient taking differently: Take 1 tablet by mouth every 6 (six) hours as needed for moderate pain.  08/09/17  Yes Earnestine Leys, MD  lisinopril (PRINIVIL,ZESTRIL) 20 MG tablet Take 20 mg by mouth 2 (two) times daily.   Yes [provider]  methocarbamol (ROBAXIN) 500 MG tablet Take 1 tablet (500 mg total) by mouth 3 (three) times daily. 08/09/17  Yes Earnestine Leys, MD  pravastatin (PRAVACHOL) 20 MG tablet Take 1 tablet (20 mg total) by mouth at bedtime. 08/09/17  Yes Earnestine Leys, MD   Ct Hip Left Wo Contrast  Result Date:  08/25/2017 CLINICAL DATA:  Fall 2 days prior to imaging.  Hip fractures. EXAM: CT OF THE LEFT HIP WITHOUT CONTRAST TECHNIQUE: Multidetector CT imaging of the left hip was performed according to the standard protocol. Multiplanar CT image reconstructions were also generated. COMPARISON:  Radiographs from 08/24/2017 and 02/11/2017 FINDINGS: Bones/Joint/Cartilage Left hip intertrochanteric IM nail is in place. Slight deformity along the original fractures site which demonstrates late phase interval healing. New acute subcapital femoral neck fracture with some impaction. The presume fractures plane is traversed by the helical head of the interlocking helical blade. The tip of the head of the helical blade only 2-3 mm from the cortex of the femoral head, and on images 32 through 33 of series 4 the question of a tiny crack along the adjacent cortex of the femoral head near the fovea is raised. There are several bony fragments of the greater trochanter which likely arose from the trochanter, these are likely chronic incidental postoperative fragments from placing of the IM nail rather than acute. Distal interlocking screw of the IM nail appears unremarkable. Mild heterotopic ossification along the distal iliopsoas extending to the lesser trochanter. Small hip joint effusion. Ligaments Suboptimally assessed by CT. Muscles and Tendons Unremarkable Soft tissues Sigmoid colon diverticulosis. Foley catheter is present in the urinary bladder along with a small amount of gas. IMPRESSION: 1. New acute subcapital femoral neck fracture on the left, traversed by the helical head of the interlocking helical blade of the left hip intertrochanteric IM nail. Given the mild impaction of the subcapital femoral neck fracture, the head of the helical blade may have been driven slightly further into the femoral head than before, and sits as little as 2-3 mm from the cortical surface. Moreover, on images 32-33 of series 4, the question of a  tiny cortical crack in the femoral head adjacent to the tip of the blade and along the margin of the fovea is raised. Small left hip joint effusion. 2. Expected deformity related to the original intertrochanteric fractures. 3. Sigmoid colon diverticulosis. Electronically Signed   By: Van Clines M.D.   On: 08/25/2017 09:22   Dg Chest Portable 1 View  Result Date: 08/24/2017 CLINICAL DATA:  Preop EXAM: PORTABLE CHEST 1 VIEW COMPARISON:  07/08/2014 FINDINGS: Mild cardiomegaly. Aortic atherosclerosis. No acute consolidation or effusion. Negative for pneumothorax. Scoliosis of the spine. IMPRESSION: No active disease.  Mild cardiomegaly. Electronically Signed   By: Donavan Foil M.D.   On: 08/24/2017 22:45   Dg Hip Unilat With Pelvis 2-3 Views Left  Result Date: 08/24/2017 CLINICAL DATA:  Left hip pain after fall EXAM: DG HIP (WITH OR WITHOUT PELVIS) 2-3V LEFT COMPARISON:  02/11/2017 FINDINGS: Moderate dextroscoliosis of the lumbar spine. SI joint degenerative change bilaterally. Pubic symphysis and rami are intact. The patient is status post intramedullary rod and screw fixation of left femur for intertrochanteric fracture. Suspected acute step-off  deformity/fracture at the subcapital left femur. IMPRESSION: 1. Status post surgical rod and screw fixation of left femur for prior intertrochanteric fracture. 2. Possible step-off deformity/fracture at the left femoral head neck junction Electronically Signed   By: Donavan Foil M.D.   On: 08/24/2017 21:44    Positive ROS: All other systems have been reviewed and were otherwise negative with the exception of those mentioned in the HPI and as above.  Physical Exam: General: Alert, no acute distress Cardiovascular: No pedal edema Respiratory: No cyanosis, no use of accessory musculature GI: No organomegaly, abdomen is soft and non-tender Skin: No lesions in the area of chief complaint Neurologic: Sensation intact distally Psychiatric: Patient is  competent for consent with normal mood and affect Lymphatic: No axillary or cervical lymphadenopathy  MUSCULOSKELETAL: left hip with fluid motion, pain with internal and external rotation, pain with log roll, incisions well healed, right knee ROM 5 to 100 degrees without pain, moderate swelling, no erythema calf soft  Assessment: Left hip femoral impacted neck fracture, acute Recent total knee on the right  Plan: The diagnosis, CT, x-ray, and treatment options are all discussed. Conservative treatment may be reasonable if she is able to ambulate with assistance and continue her total knee replacement rehab. She may WBAT on the right side and TDWB on the left side. Therapy is ordered for today.  Ultimately she may be a candidate for removal of the TFN and placment of a hemiarthroplasty.   The diagnosis, risks, benefits and alternatives to treatment are all discussed in detail with the patient. Risks include but are not limited to bleeding, infection, deep vein thrombosis, pulmonary embolism, nerve or vascular injury, non-union, repeat operation, persistent pain, weakness, stiffness and death. She understands and will weight her options. I will discuss with Dr. Sabra Heck. Please call with questions.   Lovell Sheehan, MD    08/25/2017 10:59 AM

## 2017-08-25 NOTE — Progress Notes (Signed)
Patient admitted for fall, left hip fracture.  Denies any significant pain in the left hip.  Recently had right knee replacement and she went to [facility].  She had a fall due to balance problem.  Lab data, medications reviewed. Physical examination: Alert, awake, oriented. Cardiovascular: S1, S2 regular. Lungs: Clear to auscultation, no wheeze. Assessment and plan; # 1.  Acute closed left femoral neck fracture: Orthopedic is recommending conservative treatment if patient is able to ambulate with assistance and continue rehab for total knee replacement on the right side.  Patient had history of left hip surgery before.  Management as per orthopedic.   2.  Essential hypertension: Continue home medicines 3.  Hyperlipidemia: Continue statins. Time spent 30 minutes.

## 2017-08-26 DIAGNOSIS — E440 Moderate protein-calorie malnutrition: Secondary | ICD-10-CM

## 2017-08-26 LAB — CBC
HEMATOCRIT: 29.5 % — AB (ref 35.0–47.0)
Hemoglobin: 9.7 g/dL — ABNORMAL LOW (ref 12.0–16.0)
MCH: 29.1 pg (ref 26.0–34.0)
MCHC: 33.1 g/dL (ref 32.0–36.0)
MCV: 88 fL (ref 80.0–100.0)
PLATELETS: 331 10*3/uL (ref 150–440)
RBC: 3.35 MIL/uL — ABNORMAL LOW (ref 3.80–5.20)
RDW: 15.4 % — AB (ref 11.5–14.5)
WBC: 10 10*3/uL (ref 3.6–11.0)

## 2017-08-26 MED ORDER — ENSURE ENLIVE PO LIQD
237.0000 mL | Freq: Two times a day (BID) | ORAL | Status: DC
  Administered 2017-08-27: 237 mL via ORAL

## 2017-08-26 NOTE — Progress Notes (Signed)
[facility] [place] NAME: Kellie Harris    MR#:  [id number]  DATE OF BIRTH:  [birth year]  SUBJECTIVE:  CHIEF COMPLAINT:   Chief Complaint  Patient presents with  . Hip Pain   Pain left hip with movement  REVIEW OF SYSTEMS:    Review of Systems  Constitutional: Positive for malaise/fatigue. Negative for chills and fever.  HENT: Negative for sore throat.   Eyes: Negative for blurred vision, double vision and pain.  Respiratory: Negative for cough, hemoptysis, shortness of breath and wheezing.   Cardiovascular: Negative for chest pain, palpitations, orthopnea and leg swelling.  Gastrointestinal: Negative for abdominal pain, constipation, diarrhea, heartburn, nausea and vomiting.  Genitourinary: Negative for dysuria and hematuria.  Musculoskeletal: Positive for falls and joint pain. Negative for back pain.  Skin: Negative for rash.  Neurological: Positive for weakness. Negative for sensory change, speech change, focal weakness and headaches.  Endo/Heme/Allergies: Does not bruise/bleed easily.  Psychiatric/Behavioral: Negative for depression. The patient is not nervous/anxious.     DRUG ALLERGIES:  No Known Allergies  VITALS:  Blood pressure 135/74, pulse 100, temperature 99.6 F (37.6 C), temperature source Oral, resp. rate 20, height 4\' 11"  (1.499 m), weight 45.4 kg (100 lb), SpO2 94 %.  PHYSICAL EXAMINATION:   Physical Exam  GENERAL:  84 y.o.-year-old patient lying in the bed with no acute distress.  EYES: Pupils equal, round, reactive to light and accommodation. No scleral icterus. Extraocular muscles intact.  HEENT: Head atraumatic, normocephalic. Oropharynx and nasopharynx clear.  NECK:  Supple, no jugular venous distention. No thyroid enlargement, no tenderness.  LUNGS: Normal breath sounds bilaterally, no wheezing, rales, rhonchi. No use of accessory muscles of respiration.  CARDIOVASCULAR: S1, S2 normal. No murmurs, rubs, or  gallops.  ABDOMEN: Soft, nontender, nondistended. Bowel sounds present. No organomegaly or mass.  EXTREMITIES: Right knee dressing NEUROLOGIC: Cranial nerves II through XII are intact.  PSYCHIATRIC: The patient is alert and oriented x 3.  SKIN: No obvious rash, lesion, or ulcer.   LABORATORY PANEL:   CBC Recent Labs  Lab 08/26/17 0257  WBC 10.0  HGB 9.7*  HCT 29.5*  PLT 331   ------------------------------------------------------------------------------------------------------------------ Chemistries  Recent Labs  Lab 08/22/17 1530  08/25/17 0428  NA 137   < > 141  K 4.1   < > 3.9  CL 103   < > 109  CO2 24   < > 26  GLUCOSE 118*   < > 101*  BUN 12   < > 11  CREATININE 0.86   < > 0.91  CALCIUM 10.0   < > 9.7  AST 72*  --   --   ALT 34  --   --   ALKPHOS 128*  --   --   BILITOT 0.5  --   --    < > = values in this interval not displayed.   ------------------------------------------------------------------------------------------------------------------  Cardiac Enzymes No results for input(s): TROPONINI in the last 168 hours. ------------------------------------------------------------------------------------------------------------------  RADIOLOGY:  Ct Hip Left Wo Contrast  Result Date: 08/25/2017 CLINICAL DATA:  Fall 2 days prior to imaging.  Hip fractures. EXAM: CT OF THE LEFT HIP WITHOUT CONTRAST TECHNIQUE: Multidetector CT imaging of the left hip was performed according to the standard protocol. Multiplanar CT image reconstructions were also generated. COMPARISON:  Radiographs from 08/24/2017 and 02/11/2017 FINDINGS: Bones/Joint/Cartilage Left hip intertrochanteric IM nail is in place. Slight deformity along the original fractures site which demonstrates late phase interval healing.  New acute subcapital femoral neck fracture with some impaction. The presume fractures plane is traversed by the helical head of the interlocking helical blade. The tip of the head of the  helical blade only 2-3 mm from the cortex of the femoral head, and on images 32 through 33 of series 4 the question of a tiny crack along the adjacent cortex of the femoral head near the fovea is raised. There are several bony fragments of the greater trochanter which likely arose from the trochanter, these are likely chronic incidental postoperative fragments from placing of the IM nail rather than acute. Distal interlocking screw of the IM nail appears unremarkable. Mild heterotopic ossification along the distal iliopsoas extending to the lesser trochanter. Small hip joint effusion. Ligaments Suboptimally assessed by CT. Muscles and Tendons Unremarkable Soft tissues Sigmoid colon diverticulosis. Foley catheter is present in the urinary bladder along with a small amount of gas. IMPRESSION: 1. New acute subcapital femoral neck fracture on the left, traversed by the helical head of the interlocking helical blade of the left hip intertrochanteric IM nail. Given the mild impaction of the subcapital femoral neck fracture, the head of the helical blade may have been driven slightly further into the femoral head than before, and sits as little as 2-3 mm from the cortical surface. Moreover, on images 32-33 of series 4, the question of a tiny cortical crack in the femoral head adjacent to the tip of the blade and along the margin of the fovea is raised. Small left hip joint effusion. 2. Expected deformity related to the original intertrochanteric fractures. 3. Sigmoid colon diverticulosis. Electronically Signed   By: Van Clines M.D.   On: 08/25/2017 09:22   Dg Chest Portable 1 View  Result Date: 08/24/2017 CLINICAL DATA:  Preop EXAM: PORTABLE CHEST 1 VIEW COMPARISON:  07/08/2014 FINDINGS: Mild cardiomegaly. Aortic atherosclerosis. No acute consolidation or effusion. Negative for pneumothorax. Scoliosis of the spine. IMPRESSION: No active disease.  Mild cardiomegaly. Electronically Signed   By: Donavan Foil M.D.    On: 08/24/2017 22:45   Dg Hip Unilat With Pelvis 2-3 Views Left  Result Date: 08/24/2017 CLINICAL DATA:  Left hip pain after fall EXAM: DG HIP (WITH OR WITHOUT PELVIS) 2-3V LEFT COMPARISON:  02/11/2017 FINDINGS: Moderate dextroscoliosis of the lumbar spine. SI joint degenerative change bilaterally. Pubic symphysis and rami are intact. The patient is status post intramedullary rod and screw fixation of left femur for intertrochanteric fracture. Suspected acute step-off deformity/fracture at the subcapital left femur. IMPRESSION: 1. Status post surgical rod and screw fixation of left femur for prior intertrochanteric fracture. 2. Possible step-off deformity/fracture at the left femoral head neck junction Electronically Signed   By: Donavan Foil M.D.   On: 08/24/2017 21:44     ASSESSMENT AND PLAN:   1.  Acute closed left femoral neck fracture Orthopedic is recommending conservative treatment.  Patient had history of left hip surgery before.   ASA to continue per orthopedics Pain meds PRN SNF   Partial weight bearing on left. 25% per ortho. FWB on right.  2.  Essential hypertension Well controlled  3.  Hyperlipidemia: Continue statins.  All the records are reviewed and case discussed with Care Management/Social Worker Management plans discussed with the patient, family and they are in agreement.  CODE STATUS: FULL CODE  DVT Prophylaxis: SCDs  TOTAL TIME TAKING CARE OF THIS PATIENT: 35 minutes.   POSSIBLE D/C IN 1-2 DAYS, DEPENDING ON CLINICAL CONDITION.  Neita Carp M.D on 08/26/2017  at 2:26 PM  Between 7am to 6pm - Pager - [phone]  After 6pm go to [email or web address] - password [email or web address] [facility]  Office  [phone]  CC: Primary care physician; Dion Body, MD  Note: This dictation was prepared with Dragon dictation along with smaller phrase technology. Any transcriptional errors that result from this process are unintentional.

## 2017-08-26 NOTE — Progress Notes (Signed)
Subjective:   Some left hip pain today.  Out of bed in chair.  Frustrated that she is injured again.    Patient reports pain as moderate.  Objective:  The left hip has some pain with movement.  Neurovascular status good.  The right knee looks good.  She is 3 weeks post total knee.  Wound is healed well.  Will take staples out today.  VITALS:   Vitals:   08/26/17 0742 08/26/17 1144  BP: 135/74   Pulse: 98 100  Resp: 20   Temp: 99.6 F (37.6 C)   SpO2: 93% 94%    Neurologically intact ABD soft Neurovascular intact Sensation intact distally Intact pulses distally Dorsiflexion/Plantar flexion intact  LABS Recent Labs    08/24/17 2226 08/25/17 0428 08/26/17 0257  HGB 10.7* 9.8* 9.7*  HCT 32.7* 30.3* 29.5*  WBC 15.3* 12.7* 10.0  PLT 483* 397 331    Recent Labs    08/24/17 2226 08/25/17 0428  NA 139 141  K 3.6 3.9  BUN 13 11  CREATININE 1.02* 0.91  GLUCOSE 117* 101*    Recent Labs    08/24/17 2226  INR 1.00     Assessment/Plan:      Advance diet Up with therapy Discharge to SNF   Will need to be 25% weightbearing on the left for several weeks.  May now weight-bear as tolerated on the right.  PT should continue for motion of the right knee. Continue enteric-coated ASA 325 mg twice daily  Follow-up in my office 10 days.

## 2017-08-26 NOTE — Clinical Social Work Note (Signed)
Clinical Social Work Assessment  Patient Details  Name: Kellie Harris MRN: [id number] Date of Birth: 04/09/1931  Date of referral:  08/26/17               Reason for consult:  Facility Placement                Permission sought to share information with:  Family Supports Permission granted to share information::  Yes, Verbal Permission Granted  Name::     Needle,[name] Daughter   [phone] or Rigsby,brad Pandora Leiter   [phone]   Agency::  SNF admissions  Relationship::     Contact Information:     Housing/Transportation Living arrangements for the past 2 months:  Single Family Home Source of Information:  Patient Patient Interpreter Needed:    Criminal Activity/Legal Involvement Pertinent to Current Situation/Hospitalization:  No - Comment as needed Significant Relationships:  Adult Children Lives with:  Self Do you feel safe going back to the place where you live?  No Need for family participation in patient care:  No (Coment)  Care giving concerns:  Patient feels she needs short term rehab again before she is able to return back home.   Social Worker assessment / plan: Patient is an 84 year old female who lives alone in her own home.  Patient is alert and oriented x4 but hard of hearing.  Patient states she just discharged from [facility] on Friday, and would like to return back to SNF for some more therapy.  CSW spoke to patient to discuss going to SNF, patient was explained the process for trying to find placement again, and how insurance will pay for stay.  Patient expressed that she would like CSW to begin bed search for her to go to rehab.  CSW faxed required clinicals to SNFs for placement options.  Employment status:  Retired Forensic scientist:  Managed Care PT Recommendations:  [place] / Referral to community resources:  [place]  Patient/Family's Response to care: Patient is in agreement to going to SNF  again.  Patient/Family's Understanding of and Emotional Response to Diagnosis, Current Treatment, and Prognosis:  Patient aware of current prognosis and treatment plan.  Emotional Assessment Appearance:  Appears stated age Attitude/Demeanor/Rapport:    Affect (typically observed):  Appropriate, Calm Orientation:  Oriented to  Time, Oriented to Place, Oriented to Self, Oriented to Situation Alcohol / Substance use:  Not Applicable Psych involvement (Current and /or in the community):  No (Comment)  Discharge Needs  Concerns to be addressed:  Discharge Planning Concerns Readmission within the last 30 days:  Yes(08/09/17 discharged to [facility] for rehab.) Current discharge risk:  Lack of support system Barriers to Discharge:  Ship broker, Continued Medical Work up   Anell Barr 08/26/2017, 6:47 PM

## 2017-08-26 NOTE — Clinical Social Work Note (Signed)
CSW met with patient and provided bed offers, she would like to go to [facility] again for short term rehab.  CSW contacted [employer] awaiting for insurance authorization, patient should be ready to go on Tuesday once insurance has approved.  Jones Broom. Norval Morton, MSW, [place]  08/26/2017 6:44 PM

## 2017-08-26 NOTE — Progress Notes (Signed)
Initial Nutrition Assessment  DOCUMENTATION CODES:   Non-severe (moderate) malnutrition in context of chronic illness  INTERVENTION:  Provide Ensure Enlive po BID, each supplement provides 350 kcal and 20 grams of protein.  Encouraged adequate intake of calories and protein at meals.  NUTRITION DIAGNOSIS:   Moderate Malnutrition related to chronic illness as evidenced by moderate fat depletion, moderate muscle depletion, severe muscle depletion.  GOAL:   Patient will meet greater than or equal to 90% of their needs  MONITOR:   PO intake, Supplement acceptance, Labs, Weight trends, Skin, I & O's  REASON FOR ASSESSMENT:   Malnutrition Screening Tool    ASSESSMENT:   84 year old female with PMHx of pancreatic insufficiency, IBS, hx of hiatal hernia, diverticulosis, DDD, hydronephrosis, OP, recent intertrochanteric IM nail 02/12/2017, recent right TKA 08/07/2017 now admitted with acute closed left femoral neck fracture. Plan per orthopedics is conservative treatment.   -Noted discharge planning for SNF.  Met with patient at bedside. She reports her appetite is unchanged from baseline. She typically has a fairly good appetite. She has been at rehab after her recent TKA. She reports that she sleeps late so she only eats 2 meals per day. When she wakes up she has cheerios in milk with juice and coffee. Her dinner is either from a restaurant or a small meal. Patient is amenable to drinking Ensure to help meet her calorie/protein needs and promote healing.  Patient reports her UBW is 100-105 lbs and that she has been weight stable for several years now. Suspect weight of 127 lbs from 02/12/2017 is incorrect.  Medications reviewed and none pertinent.  Labs reviewed and none pertinent.  NUTRITION - FOCUSED PHYSICAL EXAM:    Most Recent Value  Orbital Region  Moderate depletion  Upper Arm Region  Severe depletion  Thoracic and Lumbar Region  Moderate depletion  Buccal Region  Moderate  depletion  Temple Region  Severe depletion  Clavicle Bone Region  Severe depletion  Clavicle and Acromion Bone Region  Severe depletion  Scapular Bone Region  Moderate depletion  Dorsal Hand  Moderate depletion  Patellar Region  Severe depletion  Anterior Thigh Region  Severe depletion  Posterior Calf Region  Moderate depletion  Edema (RD Assessment)  -- [non pitting]  Hair  Reviewed  Eyes  Reviewed  Mouth  Reviewed  Skin  Reviewed  Nails  Reviewed     Diet Order:  Diet Heart Room service appropriate? Yes; Fluid consistency: Thin  EDUCATION NEEDS:   Education needs have been addressed  Skin:  Skin Assessment: Skin Integrity Issues: Skin Integrity Issues:: Incisions Incisions: closed incisions to right knee and right eye  Last BM:  08/24/2017  Height:   Ht Readings from Last 1 Encounters:  08/24/17 '4\' 11"'$  (1.499 m)    Weight:   Wt Readings from Last 1 Encounters:  08/24/17 100 lb (45.4 kg)    Ideal Body Weight:  43.2 kg  BMI:  Body mass index is 20.2 kg/m.  Estimated Nutritional Needs:   Kcal:  1360-1600 (30-35 kcal/kg)  Protein:  70-80 grams (1.5-1.7 grams/kg)  Fluid:  1.1-1.3 L/day (25-30 mL/kg)  Willey Blade, MS, RD, LDN Office: [phone] Pager: [phone] After Hours/Weekend Pager: [phone]

## 2017-08-26 NOTE — Progress Notes (Signed)
Physical Therapy Treatment Patient Details Name: Kellie Harris MRN: [id number] DOB: 02/21/1931 Today's Date: 08/26/2017    History of Present Illness Pt is a 84 y/o F who s/p R TKA.  Pt's PMH includes osteopenia, R knee fracture, cancer, L hip IM nailing 02/12/2017.      PT Comments    Pt agreeable to PT; reports pain in Left lower extremity is 4/10. Also notes Right lower extremity continues with soreness due to recent R TKR. Pt requires Mod A for bed mobility for lower extremities and scooting. Pt re educated on left weight bearing restrictions for touch down weight bearing. Pt states understanding and demonstrates good ability to adhere as much as possible during transfer and short ambulation bed to chair. Pt does note some difficulty due to continued soreness on Right lower extremity due to recent knee replacement on R. Pt does demonstrate good effort and use of upper extremities for de weighting. Pt notes less pain with mobility to chair today than previous session. Pt participates in Bilateral lower extremity exercises with assist as needed bilaterally. Pt wishes for pillow placed under right lower extremity; educated on need/benefit to keep Right lower extremity straight due to recent knee replacement. It is also noted that pt internally rotates Bilateral lower extremities in supine and long sit. Pt again educated on proper positioning and use pillow under Left lower extremity with attempt to position in neutral. Continue PT to progress strength, endurance and safety with TDW status to improve all functional mobility.   Follow Up Recommendations  SNF     Equipment Recommendations  None recommended by PT    Recommendations for Other Services       Precautions / Restrictions Precautions Precautions: Fall Restrictions Weight Bearing Restrictions: Yes RLE Weight Bearing: Weight bearing as tolerated LLE Weight Bearing: Touchdown weight bearing    Mobility  Bed Mobility Overal bed  mobility: Needs Assistance Bed Mobility: Supine to Sit     Supine to sit: Mod assist     General bed mobility comments: primarily for LE advancement and scooting body to edge of bed. Gives good effort and manages upper body well  Transfers Overall transfer level: Needs assistance Equipment used: Rolling walker (2 wheeled) Transfers: Sit to/from Stand Sit to Stand: Mod assist         General transfer comment: Cues for proper hand placement; Mod to elevate, Min A once in stand   Ambulation/Gait Ambulation/Gait assistance: Min assist Ambulation Distance (Feet): 2 Feet Assistive device: Rolling walker (2 wheeled) Gait Pattern/deviations: Step-to pattern(TDW on L)     General Gait Details: Pt states understanding of weight bearing restriction; demonstrates good ue of upper extremities to de weight LLE. Pt notes she is trying as much as possible with RLE continuing to be sore from recent R TKR. Pt does demonstrate minimal weight through L    Stairs            Wheelchair Mobility    Modified Rankin (Stroke Patients Only)       Balance Overall balance assessment: Needs assistance Sitting-balance support: Bilateral upper extremity supported;Feet supported Sitting balance-Leahy Scale: Fair     Standing balance support: Bilateral upper extremity supported Standing balance-Leahy Scale: Fair                              Cognition Arousal/Alertness: Awake/alert Behavior During Therapy: WFL for tasks assessed/performed Overall Cognitive Status: Within Functional Limits for tasks assessed  General Comments: Demonstrates understanding of weight bearing restriction with instruction and cues during activity. Follows 1 step commands with exercises with occasional increased instruction required. Relevant conversation throughout session      Exercises General Exercises - Lower Extremity Ankle Circles/Pumps:  AROM;Both;20 reps Quad Sets: Strengthening;Both;10 reps(requires increased instruction; verbal and tactile cues) Gluteal Sets: Strengthening;Both;10 reps Short Arc Quad: AAROM;Both;10 reps Heel Slides: AAROM;Both;10 reps Hip ABduction/ADduction: AAROM;Both;10 reps Other Exercises Other Exercises: instruction on keeping BLE hip neutral or slight ER. Pt has difficulty maintaining. Use of pillow to attempt proper positioning on L. Ankle roll on R due to recent knee replacement and instruted pt to avoid pillow on R.     General Comments        Pertinent Vitals/Pain Pain Assessment: 0-10 Pain Score: 4  Pain Location: L hip(Denies pain at rest) Pain Intervention(s): Premedicated before session;Repositioned    Home Living                      Prior Function            PT Goals (current goals can now be found in the care plan section) Progress towards PT goals: Progressing toward goals    Frequency    BID      PT Plan Current plan remains appropriate    Co-evaluation              AM-PAC PT "6 Clicks" Daily Activity  Outcome Measure  Difficulty turning over in bed (including adjusting bedclothes, sheets and blankets)?: Unable Difficulty moving from lying on back to sitting on the side of the bed? : Unable Difficulty sitting down on and standing up from a chair with arms (e.g., wheelchair, bedside commode, etc,.)?: Unable Help needed moving to and from a bed to chair (including a wheelchair)?: A Little Help needed walking in hospital room?: A Lot Help needed climbing 3-5 steps with a railing? : Total 6 Click Score: 9    End of Session Equipment Utilized During Treatment: Gait belt Activity Tolerance: Patient limited by pain;Other (comment)(TDW on L ) Patient left: in chair;with call bell/phone within reach;with chair alarm set Nurse Communication: Other (comment)(Correct weight bearing status written on board) PT Visit Diagnosis: Unsteadiness on feet  (R26.81);Other abnormalities of gait and mobility (R26.89);Repeated falls (R29.6);Muscle weakness (generalized) (M62.81);History of falling (Z91.81);Pain Pain - Right/Left: Left Pain - part of body: Hip     Time: 7824-2353 PT Time Calculation (min) (ACUTE ONLY): 30 min  Charges:  $Gait Training: 8-22 mins $Therapeutic Exercise: 8-22 mins                    G Codes:       Larae Grooms, PTA 08/26/2017, 12:24 PM

## 2017-08-26 NOTE — Progress Notes (Signed)
Physical Therapy Treatment Patient Details Name: Kellie Harris MRN: [id number] DOB: 10/18/1930 Today's Date: 08/26/2017    History of Present Illness Pt is a 84 y/o F who s/p R TKA.  Pt's PMH includes osteopenia, R knee fracture, cancer, L hip IM nailing 02/12/2017.      PT Comments    Pt agreeable to PT; reports 3/10 pain in left hip/leg. Pt re instructed in exercises and participates well with assist as needed. Encouraged to continue exercises throughout the day. Requires Mod/Min A with STS transfers and cues for reminders of TDW on left. Pt wishes to remain in chair this afternoon. Pt reminded to call nursing staff when wishing return to bed. Pt agreeable. Continue PT to progress strength, endurance and safety to improve all functional mobility.    Follow Up Recommendations  SNF     Equipment Recommendations  None recommended by PT    Recommendations for Other Services       Precautions / Restrictions Precautions Precautions: Fall Restrictions Weight Bearing Restrictions: Yes RLE Weight Bearing: Weight bearing as tolerated LLE Weight Bearing: Touchdown weight bearing    Mobility  Bed Mobility Overal bed mobility: Needs Assistance Bed Mobility: Supine to Sit     Supine to sit: Mod assist     General bed mobility comments: primarily for LE advancement and scooting body to edge of bed. Gives good effort and manages upper body well  Transfers Overall transfer level: Needs assistance Equipment used: Rolling walker (2 wheeled) Transfers: Sit to/from Stand Sit to Stand: Mod assist         General transfer comment: Performed 2x. Cues for proper hand placement; Mod to elevate, Min A once in stand   Ambulation/Gait Ambulation/Gait assistance: Min assist Ambulation Distance (Feet): 2 Feet Assistive device: Rolling walker (2 wheeled) Gait Pattern/deviations: Step-to pattern(TDW on L)     General Gait Details: Pt states understanding of weight bearing restriction;  demonstrates good ue of upper extremities to de weight LLE. Pt notes she is trying as much as possible with RLE continuing to be sore from recent R TKR. Pt does demonstrate minimal weight through L    Stairs            Wheelchair Mobility    Modified Rankin (Stroke Patients Only)       Balance Overall balance assessment: Needs assistance Sitting-balance support: Bilateral upper extremity supported;Feet supported Sitting balance-Leahy Scale: Fair     Standing balance support: Bilateral upper extremity supported Standing balance-Leahy Scale: Fair                              Cognition Arousal/Alertness: Awake/alert Behavior During Therapy: WFL for tasks assessed/performed Overall Cognitive Status: Within Functional Limits for tasks assessed                                 General Comments: Demonstrates understanding of weight bearing restriction with instruction and cues during activity. Follows 1 step commands with exercises with occasional increased instruction required. Relevant conversation throughout session      Exercises General Exercises - Lower Extremity Ankle Circles/Pumps: AROM;Both;20 reps Quad Sets: Strengthening;Both;15 reps(requires increased instruction; verbal and tactile cues) Gluteal Sets: Strengthening;Both;15 reps Short Arc Quad: AAROM;Both;10 reps Long Arc Quad: AAROM;Left;20 reps;Seated(AROM R) Heel Slides: AAROM;Both;20 reps Hip ABduction/ADduction: AAROM;Both;20 reps Toe Raises: AROM;Both;10 reps;Seated Heel Raises: AROM;Both;10 reps;Seated Other Exercises Other Exercises: instruction on  keeping BLE hip neutral or slight ER. Pt has difficulty maintaining. Use of pillow to attempt proper positioning on L. Ankle roll on R due to recent knee replacement and instruted pt to avoid pillow on R.     General Comments        Pertinent Vitals/Pain Pain Assessment: 0-10 Pain Score: 3  Pain Location: L hip Pain  Intervention(s): Premedicated before session;Repositioned    Home Living                      Prior Function            PT Goals (current goals can now be found in the care plan section) Progress towards PT goals: Progressing toward goals    Frequency    BID      PT Plan Current plan remains appropriate    Co-evaluation              AM-PAC PT "6 Clicks" Daily Activity  Outcome Measure  Difficulty turning over in bed (including adjusting bedclothes, sheets and blankets)?: Unable Difficulty moving from lying on back to sitting on the side of the bed? : Unable Difficulty sitting down on and standing up from a chair with arms (e.g., wheelchair, bedside commode, etc,.)?: Unable Help needed moving to and from a bed to chair (including a wheelchair)?: A Little Help needed walking in hospital room?: A Lot Help needed climbing 3-5 steps with a railing? : Total 6 Click Score: 9    End of Session Equipment Utilized During Treatment: Gait belt Activity Tolerance: Patient limited by pain;Other (comment);Patient limited by fatigue(TDW on L ) Patient left: in chair;with call bell/phone within reach;with chair alarm set Nurse Communication: Other (comment)(Correct weight bearing status written on board) PT Visit Diagnosis: Unsteadiness on feet (R26.81);Other abnormalities of gait and mobility (R26.89);Repeated falls (R29.6);Muscle weakness (generalized) (M62.81);History of falling (Z91.81);Pain Pain - Right/Left: Left Pain - part of body: Hip     Time: 1341-1413 PT Time Calculation (min) (ACUTE ONLY): 32 min  Charges:  $Gait Training: 8-22 mins $Therapeutic Exercise: 8-22 mins $Therapeutic Activity: 8-22 mins                    G Codes:        Larae Grooms, PTA 08/26/2017, 2:45 PM

## 2017-08-27 DIAGNOSIS — Z96651 Presence of right artificial knee joint: Secondary | ICD-10-CM | POA: Diagnosis not present

## 2017-08-27 DIAGNOSIS — R4182 Altered mental status, unspecified: Secondary | ICD-10-CM | POA: Diagnosis not present

## 2017-08-27 DIAGNOSIS — S72035A Nondisplaced midcervical fracture of left femur, initial encounter for closed fracture: Secondary | ICD-10-CM | POA: Diagnosis not present

## 2017-08-27 DIAGNOSIS — E785 Hyperlipidemia, unspecified: Secondary | ICD-10-CM | POA: Diagnosis not present

## 2017-08-27 DIAGNOSIS — Z7401 Bed confinement status: Secondary | ICD-10-CM | POA: Diagnosis not present

## 2017-08-27 DIAGNOSIS — I10 Essential (primary) hypertension: Secondary | ICD-10-CM | POA: Diagnosis not present

## 2017-08-27 DIAGNOSIS — S72002A Fracture of unspecified part of neck of left femur, initial encounter for closed fracture: Secondary | ICD-10-CM | POA: Diagnosis not present

## 2017-08-27 DIAGNOSIS — R509 Fever, unspecified: Secondary | ICD-10-CM | POA: Diagnosis not present

## 2017-08-27 DIAGNOSIS — Z7982 Long term (current) use of aspirin: Secondary | ICD-10-CM | POA: Diagnosis not present

## 2017-08-27 DIAGNOSIS — S72035D Nondisplaced midcervical fracture of left femur, subsequent encounter for closed fracture with routine healing: Secondary | ICD-10-CM | POA: Diagnosis not present

## 2017-08-27 DIAGNOSIS — S7292XD Unspecified fracture of left femur, subsequent encounter for closed fracture with routine healing: Secondary | ICD-10-CM | POA: Diagnosis not present

## 2017-08-27 DIAGNOSIS — R2689 Other abnormalities of gait and mobility: Secondary | ICD-10-CM | POA: Diagnosis not present

## 2017-08-27 DIAGNOSIS — R41841 Cognitive communication deficit: Secondary | ICD-10-CM | POA: Diagnosis not present

## 2017-08-27 DIAGNOSIS — D649 Anemia, unspecified: Secondary | ICD-10-CM | POA: Diagnosis not present

## 2017-08-27 DIAGNOSIS — N390 Urinary tract infection, site not specified: Secondary | ICD-10-CM | POA: Diagnosis not present

## 2017-08-27 DIAGNOSIS — M84459A Pathological fracture, hip, unspecified, initial encounter for fracture: Secondary | ICD-10-CM | POA: Diagnosis not present

## 2017-08-27 DIAGNOSIS — S72143D Displaced intertrochanteric fracture of unspecified femur, subsequent encounter for closed fracture with routine healing: Secondary | ICD-10-CM | POA: Diagnosis not present

## 2017-08-27 MED ORDER — HYDROCODONE-ACETAMINOPHEN 5-325 MG PO TABS: 1.0000 | ORAL_TABLET | Freq: Four times a day (QID) | ORAL | 0 refills | Status: DC | PRN

## 2017-08-27 NOTE — Progress Notes (Signed)
Physical Therapy Treatment Patient Details Name: Kellie Harris MRN: [id number] DOB: 13-Mar-1931 Today's Date: 08/27/2017    History of Present Illness Pt admitted s/p L hip fracture after a fall.  PMH includes R knee TKA, DJD, CA, pancreatic insufficiency, ovarian cyst, osteopenia, pancreatic insufficiency, arthritis and IBS.    PT Comments    Pt requesting to attempt to void.  To edge of bed with min assist.  Stood with walker and transferred to commode at bedside with with min/mod assist.  She was unable to void - stated catheter was removed earlier this am.  Nurse tech notified.  She then transferred to recliner and remained up in chair.  Participated in exercises as described below for BLE.   Follow Up Recommendations  SNF     Equipment Recommendations  None recommended by PT    Recommendations for Other Services       Precautions / Restrictions Precautions Precautions: Fall Restrictions Weight Bearing Restrictions: Yes RLE Weight Bearing: Weight bearing as tolerated LLE Weight Bearing: Touchdown weight bearing    Mobility  Bed Mobility Overal bed mobility: Needs Assistance Bed Mobility: Supine to Sit     Supine to sit: Min assist     General bed mobility comments: primarily for LE advancement and scooting body to edge of bed. Gives good effort and manages upper body well  Transfers Overall transfer level: Needs assistance Equipment used: Rolling walker (2 wheeled) Transfers: Sit to/from Stand Sit to Stand: Mod assist            Ambulation/Gait Ambulation/Gait assistance: Min assist Ambulation Distance (Feet): 2 Feet Assistive device: Rolling walker (2 wheeled) Gait Pattern/deviations: Step-to pattern Gait velocity: decreased Gait velocity interpretation: Below normal speed for age/gender General Gait Details: Aware of WB restrictions.  Gait limited as she does put minimal weigth on LE during transfers.     Stairs            Wheelchair Mobility     Modified Rankin (Stroke Patients Only)       Balance Overall balance assessment: Needs assistance Sitting-balance support: Bilateral upper extremity supported;Feet supported Sitting balance-Leahy Scale: Fair     Standing balance support: Bilateral upper extremity supported Standing balance-Leahy Scale: Fair Standing balance comment: Requires use of RW for balance due to pain and WB status.  Pt has also fallen 3x in the past year, per pt.                            Cognition Arousal/Alertness: Awake/alert   Overall Cognitive Status: Within Functional Limits for tasks assessed                                        Exercises Total Joint Exercises Ankle Circles/Pumps: AROM;Both;10 reps;Supine Quad Sets: Strengthening;Both;10 reps;Supine Heel Slides: Strengthening;AROM;10 reps;Supine;Both Hip ABduction/ADduction: Strengthening;10 reps;Supine;Both Straight Leg Raises: Strengthening;10 reps;Supine;Right Long Arc Quad: 10 reps;AROM;Strengthening;Seated;Both Other Exercises Other Exercises: reviewed WB staus    General Comments        Pertinent Vitals/Pain Pain Assessment: 0-10 Pain Score: 5  Pain Location: L hip Pain Descriptors / Indicators: Aching Pain Intervention(s): Limited activity within patient's tolerance;Monitored during session    Home Living                      Prior Function  PT Goals (current goals can now be found in the care plan section) Progress towards PT goals: Progressing toward goals    Frequency    BID      PT Plan Current plan remains appropriate    Co-evaluation              AM-PAC PT "6 Clicks" Daily Activity  Outcome Measure  Difficulty turning over in bed (including adjusting bedclothes, sheets and blankets)?: Unable Difficulty moving from lying on back to sitting on the side of the bed? : Unable Difficulty sitting down on and standing up from a chair with arms (e.g.,  wheelchair, bedside commode, etc,.)?: Unable Help needed moving to and from a bed to chair (including a wheelchair)?: A Little Help needed walking in hospital room?: A Lot Help needed climbing 3-5 steps with a railing? : Total 6 Click Score: 9    End of Session Equipment Utilized During Treatment: Gait belt Activity Tolerance: Patient tolerated treatment well Patient left: in chair;with chair alarm set;with call bell/phone within reach Nurse Communication: Other (comment) Pain - Right/Left: Left Pain - part of body: Hip     Time: 1025-8527 PT Time Calculation (min) (ACUTE ONLY): 23 min  Charges:  $Therapeutic Exercise: 8-22 mins $Therapeutic Activity: 8-22 mins                    G Codes:       Chesley Noon, PTA 08/27/17, 11:20 AM

## 2017-08-27 NOTE — Clinical Social Work Note (Addendum)
CSW received phone call from patient's insurance company they have approved her to go to SNF for short term rehab, Josem Kaufmann number is [phone].  Patient to be d/c'ed today to [facility] [place].  Patient and family agreeable to plans will transport via ems RN to call report to room [place], Yaakov Guthrie, [phone].  CSW was in the room when patient updated her daughter [name] via phone.  Evette Cristal, MSW, [place]

## 2017-08-27 NOTE — Progress Notes (Signed)
Pt in no acute distress VSS. Report called to [facility]. Pt transported via EMS

## 2017-08-27 NOTE — Discharge Summary (Signed)
[place] [facility] [place] NAME: Kellie Harris    MR#:  [id number]  DATE OF BIRTH:  02/23/1931  DATE OF ADMISSION:  08/24/2017 ADMITTING PHYSICIAN: Lance Coon, MD  DATE OF DISCHARGE: 08/27/2016  PRIMARY CARE PHYSICIAN: Dion Body, MD   ADMISSION DIAGNOSIS:  Closed left hip fracture, initial encounter ([facility]) [S72.002A]  DISCHARGE DIAGNOSIS:  Principal Problem:   Closed left hip fracture (HCC) Active Problems:   HTN (hypertension)   HLD (hyperlipidemia)   Malnutrition of moderate degree   SECONDARY DIAGNOSIS:   Past Medical History:  Diagnosis Date  . Arthritis    right knee, left shoulder/ OSTEO  . Cancer ([place])    skin   . Degenerative disc disease, lumbar    L4-L5  . Diverticulosis    ITIS  . History of hiatal hernia   . Hydronephrosis    right  . Hypercholesteremia   . Hypertension    CONTROLLED ON MEDS  . IBS (irritable bowel syndrome)   . Knee fracture, right 03/05/2015   DIFFICULTY WITH AMBULATION  . Osteopenia   . Ovarian cyst   . Pancreatic insufficiency   . Wears hearing aid    bilateral     ADMITTING HISTORY  HISTORY OF PRESENT ILLNESS:  Kellie Harris  is a 84 y.o. female who presents with mechanical fall at home and subsequent left hip fracture.  Patient had that same hip repaired several months ago after fracture.  Hospitalist were called for admission with orthopedic surgery to consult     HOSPITAL COURSE:   1. Acute closedleft femoral neck fracture Orthopedic is recommending conservative treatment. Patient had history of left hip surgery before.  ASA 325 mg BID to continue per orthopedics Pain meds PRN SNFat discharge.  Partial weight bearing on left. 25% per ortho. FWB on right.  2. Essential hypertension Well controlled  3. Hyperlipidemia: Continue statins.  Stable for discharge to SNF  CONSULTS OBTAINED:    DRUG ALLERGIES:  No Known Allergies  DISCHARGE MEDICATIONS:    Allergies as of 08/27/2017   No Known Allergies     Medication List    TAKE these medications   amLODipine 5 MG tablet Commonly known as:  NORVASC Take 5 mg by mouth daily.   aspirin EC 325 MG tablet Take 1 tablet (325 mg total) by mouth 2 (two) times daily. Take for 4 weeks   celecoxib 200 MG capsule Commonly known as:  CELEBREX Take 1 capsule (200 mg total) by mouth every 12 (twelve) hours.   gabapentin 100 MG capsule Commonly known as:  NEURONTIN Take 1 capsule (100 mg total) by mouth 3 (three) times daily.   HYDROcodone-acetaminophen 5-325 MG tablet Commonly known as:  NORCO Take 1 tablet by mouth every 6 (six) hours as needed for moderate pain or severe pain. What changed:  reasons to take this   lisinopril 20 MG tablet Commonly known as:  PRINIVIL,ZESTRIL Take 20 mg by mouth 2 (two) times daily.   methocarbamol 500 MG tablet Commonly known as:  ROBAXIN Take 1 tablet (500 mg total) by mouth 3 (three) times daily.   pravastatin 20 MG tablet Commonly known as:  PRAVACHOL Take 1 tablet (20 mg total) by mouth at bedtime.       Today   VITAL SIGNS:  Blood pressure 128/65, pulse 85, temperature 98.7 F (37.1 C), temperature source Oral, resp. rate 18, height 4\' 11"  (1.499 m), weight 45.4 kg (100 lb), SpO2 99 %.  I/O:  Intake/Output Summary (Last 24 hours) at 08/27/2017 1005 Last data filed at 08/27/2017 0900 Gross per 24 hour  Intake 720 ml  Output 550 ml  Net 170 ml    PHYSICAL EXAMINATION:  Physical Exam  GENERAL:  84 y.o.-year-old patient lying in the bed with no acute distress.  LUNGS: Normal breath sounds bilaterally, no wheezing, rales,rhonchi or crepitation. No use of accessory muscles of respiration.  CARDIOVASCULAR: S1, S2 normal. No murmurs, rubs, or gallops.  ABDOMEN: Soft, non-tender, non-distended. Bowel sounds present. No organomegaly or mass.  NEUROLOGIC: Moves all 4 extremities. PSYCHIATRIC: The patient is alert and oriented x 3.   SKIN: No obvious rash, lesion, or ulcer.   DATA REVIEW:   CBC Recent Labs  Lab 08/26/17 0257  WBC 10.0  HGB 9.7*  HCT 29.5*  PLT 331    Chemistries  Recent Labs  Lab 08/22/17 1530  08/25/17 0428  NA 137   < > 141  K 4.1   < > 3.9  CL 103   < > 109  CO2 24   < > 26  GLUCOSE 118*   < > 101*  BUN 12   < > 11  CREATININE 0.86   < > 0.91  CALCIUM 10.0   < > 9.7  AST 72*  --   --   ALT 34  --   --   ALKPHOS 128*  --   --   BILITOT 0.5  --   --    < > = values in this interval not displayed.    Cardiac Enzymes No results for input(s): TROPONINI in the last 168 hours.  Microbiology Results  Results for orders placed or performed during the hospital encounter of 08/24/17  Surgical pcr screen     Status: None   Collection Time: 08/25/17  5:53 AM  Result Value Ref Range Status   MRSA, PCR NEGATIVE NEGATIVE Final   Staphylococcus aureus NEGATIVE NEGATIVE Final    Comment: (NOTE) The Xpert SA Assay (FDA approved for NASAL specimens in patients 70 years of age and older), is one component of a comprehensive surveillance program. It is not intended to diagnose infection nor to guide or monitor treatment. Performed at [facility], [address] [place], [place] [address]     RADIOLOGY:  No results found.  Follow up with PCP in 1 week.  Management plans discussed with the patient, family and they are in agreement.  CODE STATUS:     Code Status Orders  (From admission, onward)        Start     Ordered   08/25/17 0115  Full code  Continuous     08/25/17 0114    Code Status History    Date Active Date Inactive Code Status Order ID Comments User Context   08/07/2017 12:17 08/09/2017 19:54 Full Code [id number]  Earnestine Leys, MD Inpatient   02/12/2017 01:40 02/14/2017 20:23 Full Code [id number]  Lance Coon, MD ED    Advance Directive Documentation     Most Recent Value  Type of Advance Directive  Living will, Healthcare Power of Attorney   Pre-existing out of facility DNR order (yellow form or pink MOST form)  No data  "MOST" Form in Place?  No data      TOTAL TIME TAKING CARE OF THIS PATIENT ON DAY OF DISCHARGE: more than 30 minutes.   Neita Carp M.D on 08/27/2017 at 10:05 AM  Between 7am to 6pm - Pager - [phone]  After 6pm go to [email or web address] - password [email or web address] [facility]  Office  [phone]  CC: Primary care physician; Dion Body, MD  Note: This dictation was prepared with Dragon dictation along with smaller phrase technology. Any transcriptional errors that result from this process are unintentional.

## 2017-08-27 NOTE — Discharge Instructions (Signed)
Partial weight bearing on left leg till orthopedic f/u. FWB right lower extremitiy

## 2017-08-27 NOTE — Consult Note (Signed)
   [facility] CM Inpatient Consult   08/27/2017  Kellie Harris November 13, 1930 [id number]   Referral received from [employer] to engage patient, who has a diagnosis of Left hip fracture  for post hospital discharge follow up. Patient was evaluated for community based chronic disease management services with [facility] care Management Program as a benefit of patient's [employer] Medicare. This hospital liaison called into the patient's room to inform patient of her eligibility for [facility] Management services and to explain [facility] Management services. Patient stated she had recently been released from SNF and was so frustrated that she fell at home so soon. She stated she lives alone and her family lives out of town. She stated this fall had caused her a lot of pain and felt it could be because she had previously injured this same hip.  Patient verbally agrees to services. Patient endorses her primary care provider to be Dr. Netty Starring. Patient states she plans to go to short term rehab at hospital discharge but would like [facility] services to follow her there and at home. Consent form NOT signed. Patient will receive post hospital visit by [facility] CSW to assist in the transition from short term rehab to home. Inpatient social worker  made aware of patient's decision to accept services. Inpatient social worker is awaiting authorization form [employer] for SNF, but stated [facility] had accepted patient when authorization approved. [facility] Care Management services do not interfere with or replace any services arranged by the inpatient care management team. For additional questions please contact:   Nyhla Mountjoy RN, [facility] Liaison  [phone]) Business Mobile [phone]) Toll free office

## 2017-08-27 NOTE — Clinical Social Work Placement (Signed)
   CLINICAL SOCIAL WORK PLACEMENT  NOTE  Date:  08/27/2017  Patient Details  Name: Kellie Harris MRN: [id number] Date of Birth: 09-Jul-1931  Clinical Social Work is seeking post-discharge placement for this patient at the [place] level of care (*CSW will initial, date and re-position this form in  chart as items are completed):  Yes   Patient/family provided with [facility] Work Department's list of facilities offering this level of care within the geographic area requested by the patient (or if unable, by the patient's family).  Yes   Patient/family informed of their freedom to choose among providers that offer the needed level of care, that participate in Medicare, Medicaid or managed care program needed by the patient, have an available bed and are willing to accept the patient.  Yes   Patient/family informed of [facility]'s ownership interest in [facility] and [facility], as well as of the fact that they are under no obligation to receive care at these facilities.  PASRR submitted to EDS on 08/26/17     PASRR number received on       Existing PASRR number confirmed on 08/26/17     FL2 transmitted to all facilities in geographic area requested by pt/family on 08/26/17     FL2 transmitted to all facilities within larger geographic area on 08/26/17     Patient informed that his/her managed care company has contracts with or will negotiate with certain facilities, including the following:        No   Patient/family informed of bed offers received.  Patient chooses bed at [facility]     Physician recommends and patient chooses bed at      Patient to be transferred to   on 08/27/17.  Patient to be transferred to facility by [facility]     Patient family notified on 08/27/17 of transfer.  Name of family member notified:  Patient notified daughter [name].     PHYSICIAN Please sign FL2     Additional Comment:     _______________________________________________ Ross Ludwig, LCSWA 08/27/2017, 4:23 PM

## 2017-08-29 DIAGNOSIS — I10 Essential (primary) hypertension: Secondary | ICD-10-CM | POA: Diagnosis not present

## 2017-08-29 DIAGNOSIS — S7292XD Unspecified fracture of left femur, subsequent encounter for closed fracture with routine healing: Secondary | ICD-10-CM | POA: Diagnosis not present

## 2017-08-29 DIAGNOSIS — E785 Hyperlipidemia, unspecified: Secondary | ICD-10-CM | POA: Diagnosis not present

## 2017-08-29 DIAGNOSIS — R509 Fever, unspecified: Secondary | ICD-10-CM | POA: Diagnosis not present

## 2017-08-29 DIAGNOSIS — Z7982 Long term (current) use of aspirin: Secondary | ICD-10-CM | POA: Diagnosis not present

## 2017-08-29 DIAGNOSIS — R4182 Altered mental status, unspecified: Secondary | ICD-10-CM | POA: Diagnosis not present

## 2017-09-05 ENCOUNTER — Encounter: Payer: Self-pay | Admitting: *Deleted

## 2017-09-05 ENCOUNTER — Other Ambulatory Visit: Payer: Self-pay | Admitting: *Deleted

## 2017-09-05 DIAGNOSIS — Z96651 Presence of right artificial knee joint: Secondary | ICD-10-CM | POA: Diagnosis not present

## 2017-09-05 DIAGNOSIS — S7292XD Unspecified fracture of left femur, subsequent encounter for closed fracture with routine healing: Secondary | ICD-10-CM | POA: Diagnosis not present

## 2017-09-05 DIAGNOSIS — E785 Hyperlipidemia, unspecified: Secondary | ICD-10-CM | POA: Diagnosis not present

## 2017-09-05 DIAGNOSIS — I10 Essential (primary) hypertension: Secondary | ICD-10-CM | POA: Diagnosis not present

## 2017-09-05 DIAGNOSIS — D649 Anemia, unspecified: Secondary | ICD-10-CM | POA: Diagnosis not present

## 2017-09-05 DIAGNOSIS — N390 Urinary tract infection, site not specified: Secondary | ICD-10-CM | POA: Diagnosis not present

## 2017-09-06 ENCOUNTER — Encounter: Payer: Self-pay | Admitting: *Deleted

## 2017-09-06 DIAGNOSIS — Z96651 Presence of right artificial knee joint: Secondary | ICD-10-CM | POA: Diagnosis not present

## 2017-09-06 DIAGNOSIS — S72035D Nondisplaced midcervical fracture of left femur, subsequent encounter for closed fracture with routine healing: Secondary | ICD-10-CM | POA: Diagnosis not present

## 2017-09-06 DIAGNOSIS — S72035A Nondisplaced midcervical fracture of left femur, initial encounter for closed fracture: Secondary | ICD-10-CM | POA: Diagnosis not present

## 2017-09-06 NOTE — Patient Outreach (Addendum)
  [facility]) Care Management  [facility] Social Work  09/06/2017  Kellie Harris 08-Jul-1931 [id number]  Subjective:  Patient is a 84 year old female currently in re[facility] following a hospitalization due to a fall and subsequent left hip fracture. Per patient, she tripped over her walker. Patient states that she lives alone, keeps her phone in her pocket at all times in case of an emergency. Patient also has a security system Patient has a daughter that lives locally and checks on her often, however patient states that she may need a in home aid post discharge. Patient is not interested in ALF, or living with her daughter at this time.  Objective:   Encounter Medications:  Outpatient Encounter Medications as of 09/05/2017  Medication Sig  . amLODipine (NORVASC) 5 MG tablet Take 5 mg by mouth daily.  [name] Kitchen aspirin EC 325 MG tablet Take 1 tablet (325 mg total) by mouth 2 (two) times daily. Take for 4 weeks  . celecoxib (CELEBREX) 200 MG capsule Take 1 capsule (200 mg total) by mouth every 12 (twelve) hours.  . gabapentin (NEURONTIN) 100 MG capsule Take 1 capsule (100 mg total) by mouth 3 (three) times daily.  [name] Kitchen HYDROcodone-acetaminophen (NORCO) 5-325 MG tablet Take 1 tablet by mouth every 6 (six) hours as needed for moderate pain or severe pain.  [name] Kitchen lisinopril (PRINIVIL,ZESTRIL) 20 MG tablet Take 20 mg by mouth 2 (two) times daily.  . methocarbamol (ROBAXIN) 500 MG tablet Take 1 tablet (500 mg total) by mouth 3 (three) times daily.  . pravastatin (PRAVACHOL) 20 MG tablet Take 1 tablet (20 mg total) by mouth at bedtime.   No facility-administered encounter medications on file as of 09/05/2017.     Functional Status:  In your present state of health, do you have any difficulty performing the following activities: 08/25/2017 08/07/2017  Hearing? N -  Vision? N -  Difficulty concentrating or making decisions? N -  Walking or climbing stairs? Y -  Dressing or bathing? N -   Doing errands, shopping? Y N  Some recent data might be hidden    Fall/Depression Screening:  PHQ 2/9 Scores 09/05/2017  PHQ - 2 Score 0    Assessment: This Education officer, museum spoke with the discharge planner, Queen Blossom. There has been no discharge date set yet, however patient will discharge home with [facility].  Patient very pleasant and engaging. She is motivated and ready to return home "My daughter wants me to stay a few more days" [facility] care management program reviewed, verbal consent received. Patient will like to review consent before signing.  Plan: This social worker to continue to follow patient's progress while in rehab.   [name] [facility] Care Management [phone]

## 2017-09-12 ENCOUNTER — Other Ambulatory Visit: Payer: Self-pay | Admitting: *Deleted

## 2017-09-13 NOTE — Patient Outreach (Signed)
[facility]) Care Management  09/13/2017  Kellie Harris 09-19-1930 [id number]   Phone call from patient confirming that she will be discharging from [facility] on 09/13/17 with [facility]. Per patient, she feels that she can return home without the assistance of a in home aid and would like to try discharging home to see how well she does on her own.    Plan: This social worker will plan to visit patient next week to continue to assess for safety and community resource needs. Patient to be referred to [facility] for transition of care.   [name] [facility] Care Management [phone]

## 2017-09-14 DIAGNOSIS — K5700 Diverticulitis of small intestine with perforation and abscess without bleeding: Secondary | ICD-10-CM | POA: Diagnosis not present

## 2017-09-14 DIAGNOSIS — M1991 Primary osteoarthritis, unspecified site: Secondary | ICD-10-CM | POA: Diagnosis not present

## 2017-09-14 DIAGNOSIS — M8580 Other specified disorders of bone density and structure, unspecified site: Secondary | ICD-10-CM | POA: Diagnosis not present

## 2017-09-14 DIAGNOSIS — I10 Essential (primary) hypertension: Secondary | ICD-10-CM | POA: Diagnosis not present

## 2017-09-14 DIAGNOSIS — S72142D Displaced intertrochanteric fracture of left femur, subsequent encounter for closed fracture with routine healing: Secondary | ICD-10-CM | POA: Diagnosis not present

## 2017-09-16 ENCOUNTER — Encounter: Payer: Self-pay | Admitting: *Deleted

## 2017-09-16 ENCOUNTER — Other Ambulatory Visit: Payer: Self-pay | Admitting: *Deleted

## 2017-09-16 NOTE — Patient Outreach (Signed)
[facility]) Care Management  [place]  09/16/2017   Kellie Harris May 03, 1931 [id number]    Transition of care call   Admitted to [facility] on 1/12-1/15 [facility] 1/15-09/13/17   84 yo female with recent history of fall and fracture to left hip. Recent Right TKA 08/07/17, PMHx includes, HTN, Hyperlipidemia.   Subjective:  Successful outreach call to patient explained reason for the call and [facility] care management services, HIPAA information verified. Patient verbal agreed [facility] care management services.  Patient discussed she is moving a little slow but hopes to be able to improve walking and eventually get back to driving.  Patient discussed she lives at home alone, has a supportive daughter that is a phone call away and provides transportation to and assistance to doctor visit, but she lives in [place] and has busy days.   Patient discussed concerning hiring a lady to come in a help her on some days.  Patient has home health services with [facility], home health physical therapy visit on yesterday and Occupational therapy planned for today. Patient reports she has all needed medical equipment in home from previous knee surgery. She currently transfers from  bed to wheelchair to get to bathroom and kitchen area. She report her appetite is good and she usually eats 2 meals at day, because she gets up late, not a early riser. She usually has cereals and some easy to get to foods , reinforced good nutrition for healing.  Patient denies pain at this time, but most of her discomfort has been from her recent surgery but that is improving.  Patient reports she manages her own medication, denies cost concerns, Patient was recently discharged from hospital and all medications have been reviewed.  Patient has PCP visit scheduled 2/12, discussed she may change appointment, because that is soon, reinforced importance of PCP visit after discharge. ,and with Dr.Miller on 1/25.and  states her daughter will be available to help with transportation to visit.     Encounter Medications:  Outpatient Encounter Medications as of 09/16/2017  Medication Sig  . amLODipine (NORVASC) 5 MG tablet Take 5 mg by mouth daily.  . celecoxib (CELEBREX) 200 MG capsule Take 1 capsule (200 mg total) by mouth every 12 (twelve) hours.  . gabapentin (NEURONTIN) 100 MG capsule Take 1 capsule (100 mg total) by mouth 3 (three) times daily.  [name] Kitchen lisinopril (PRINIVIL,ZESTRIL) 20 MG tablet Take 20 mg by mouth 2 (two) times daily.  . methocarbamol (ROBAXIN) 500 MG tablet Take 1 tablet (500 mg total) by mouth 3 (three) times daily.  . pravastatin (PRAVACHOL) 20 MG tablet Take 1 tablet (20 mg total) by mouth at bedtime.  [name] Kitchen aspirin EC 325 MG tablet Take 1 tablet (325 mg total) by mouth 2 (two) times daily. Take for 4 weeks (Patient not taking: Reported on 09/16/2017)  . HYDROcodone-acetaminophen (NORCO) 5-325 MG tablet Take 1 tablet by mouth every 6 (six) hours as needed for moderate pain or severe pain. (Patient not taking: Reported on 09/16/2017)   No facility-administered encounter medications on file as of 09/16/2017.     Functional Status:  In your present state of health, do you have any difficulty performing the following activities: 09/16/2017 08/25/2017  Hearing? Y N  Vision? N N  Difficulty concentrating or making decisions? N N  Walking or climbing stairs? Y Y  Dressing or bathing? Y N  Doing errands, shopping? [name]  Preparing Food and eating ? Y -  Using the Toilet?  N -  In the past six months, have you accidently leaked urine? N -  Do you have problems with loss of bowel control? N -  Managing your Medications? N -  Managing your Finances? N -  Housekeeping or managing your Housekeeping? Y -  Some recent data might be hidden    Fall/Depression Screening: Fall Risk  09/05/2017  Falls in the past year? Yes  Number falls in past yr: 2 or more  Injury with Fall? Yes  Risk Factor Category  High  Fall Risk  Risk for fall due to : History of fall(s);Impaired balance/gait;Impaired mobility  Follow up Falls prevention discussed   PHQ 2/9 Scores 09/05/2017  PHQ - 2 Score 0    Assessment:  Patient agreeable to [facility] care management services.  Plan:  Patient will be active with transition of care follow up , next call in a week, and schedule home visit . Will send barrier involvement letter to PCP.   [facility] CM Care Plan Problem One     Most Recent Value  Care Plan Problem One  Recent hospital admission and Rehab stay related to left hip fracture   Role Documenting the Problem One  Care Management [place] for Problem One  Active  [facility] Long Term Goal   Patient will not experience a hospital admission within the next 31 days   [facility] Long Term Goal Start Date  09/16/17  Interventions for Problem One Long Term Goal  Discussed transition of care program, adhering to medication discharge instruction, notifying MD of increase of pain.   [facility] CM Short Term Goal #1   Patient will attend medical appointment in the next 30  days   [facility] CM Short Term Goal #1 Start Date  09/16/17  Interventions for Short Term Goal #1  Reviewed scheduled PCP visit on 1/12 reinforced attending , discussed transportation arrangement and advised regarding the importance to keeping appointment.   [facility] CM Short Term Goal #2   Patient will report no falls in the next 30 days   [facility] CM Short Term Goal #2 Start Date  09/16/17  Interventions for Short Term Goal #2  Advised patient regarding adhering to fall prevention measures of using wheelchair to mobility at this time, adhere to limitaiton on weight bearing status , keep phone in pocket ,when out of bed, avoid reaching for items high up on shelves.       [name], RN, [place] Management Coordinator  [phone]- Mobile [phone]- Toll Free Main Office

## 2017-09-17 ENCOUNTER — Encounter: Payer: Self-pay | Admitting: *Deleted

## 2017-09-20 ENCOUNTER — Other Ambulatory Visit: Payer: Self-pay | Admitting: *Deleted

## 2017-09-20 NOTE — Patient Outreach (Signed)
[facility]) Care Management  [facility] Social Work  09/20/2017  Kellie Harris [birth year] [id number]  Subjective:  Patient is a  84 year old female, recent discharge from [facility]hab at [facility] due to a closed left hip fracture. Per patient, she is still on non-weight bearing status and is using her wheelchair for mobility. Patient currently receiving HH from [facility]. Patient states that she takes care of her own ADL's, has a showers chair, friend on stand by for assistance when showering to avoid falls. Per patient, she has been in contact with  [place] for personal care assistance and also hs a private duty aid that she can call on as needed., Per patient, she does not feel that she needs personal care through a service at this time and feels more comfortable to contact the private duty aid on a as needed basis.  Per patient, she does not drive,  daughter takes patient to appointments, however she cancelled her  Appointment  with her  primary care doctor until her mobility improves. [facility]  RNCM  enocuraged patient to re-schedule this appointment as soon as possible. Objective:   Encounter Medications:  Outpatient Encounter Medications as of 09/20/2017  Medication Sig Note  . amLODipine (NORVASC) 5 MG tablet Take 5 mg by mouth daily.   [name] Kitchen aspirin EC 325 MG tablet Take 1 tablet (325 mg total) by mouth 2 (two) times daily. Take for 4 weeks (Patient not taking: Reported on 09/16/2017) 09/16/2017: Reports she has completed this course   . aspirin EC 81 MG tablet Take 81 mg by mouth daily.   . celecoxib (CELEBREX) 200 MG capsule Take 1 capsule (200 mg total) by mouth every 12 (twelve) hours.   . gabapentin (NEURONTIN) 100 MG capsule Take 1 capsule (100 mg total) by mouth 3 (three) times daily.   [name] Kitchen HYDROcodone-acetaminophen (NORCO) 5-325 MG tablet Take 1 tablet by mouth every 6 (six) hours as needed for moderate pain or severe pain. (Patient not taking: Reported on 09/16/2017)   . lisinopril  (PRINIVIL,ZESTRIL) 20 MG tablet Take 20 mg by mouth 2 (two) times daily.   . methocarbamol (ROBAXIN) 500 MG tablet Take 1 tablet (500 mg total) by mouth 3 (three) times daily.   . pravastatin (PRAVACHOL) 20 MG tablet Take 1 tablet (20 mg total) by mouth at bedtime.    No facility-administered encounter medications on file as of 09/20/2017.     Functional Status:  In your present state of health, do you have any difficulty performing the following activities: 09/16/2017 08/25/2017  Hearing? Y N  Vision? N N  Difficulty concentrating or making decisions? N N  Walking or climbing stairs? Y Y  Dressing or bathing? Y N  Comment no problem dressing taking sponge bath, working with OT on shower -  Doing errands, shopping? [name]  Comment family will provide transportation -  Conservation officer, nature and eating ? Y -  Comment needs some assistance, keeps foods on counter easily assessible , and family assist  -  Using the Toilet? N -  In the past six months, have you accidently leaked urine? N -  Do you have problems with loss of bowel control? N -  Managing your Medications? N -  Managing your Finances? N -  Housekeeping or managing your Housekeeping? Y -  Comment family assist as needed -  Some recent data might be hidden    Fall/Depression Screening:  PHQ 2/9 Scores 09/05/2017  PHQ - 2 Score  0    Assessment: Co-visit with RNCM Landis Martins. 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  Patient continues with [facility] PT. Ambulates in a wheelchair due to non-weight bearing status. Patient manages her own medications. Daughter transports to medical  Appointments. Positive support described. In home aid available as needed. Patient verbalized having no community resource needs agrees to case closure. Patient encouraged to call this social worker if there are any issues/concerns that arise in the future.   Plan: Patient to be closed at this time to Energy work.  [name] [facility] Care  Management [phone]

## 2017-09-20 NOTE — Patient Outreach (Signed)
[facility]) Care Management   09/20/2017  Kellie Harris Jul 22, 1931 [id number]  Kellie Harris is an 84 y.o. female  Subjective:  Patient discussed progress with she has been making with therapies since discharge home. Reports knee bothers her more than left hip, denies need for prn Norco, reports pain control with scheduled,medications Neurontin and Celebrex.   Objective:  BP 128/74 (BP Location: Left Arm, Patient Position: Sitting, Cuff Size: Normal)   Pulse 82   Resp 18   Ht 1.499 m (4\' 11" )   Wt 103 lb (46.7 kg)   SpO2 96%   BMI 20.80 kg/m   Patient sitting in transport chair, using chair to move about home.  Review of Systems  Constitutional: Negative.   HENT: Negative.   Eyes: Negative.   Respiratory: Negative.   Cardiovascular: Negative.   Gastrointestinal: Negative.   Genitourinary: Negative.   Musculoskeletal: Positive for joint pain.  Skin: Negative.   Neurological: Negative.   Endo/Heme/Allergies: Negative.   Psychiatric/Behavioral: Negative.     Physical Exam  Constitutional: She is oriented to person, place, and time. She appears well-developed and well-nourished.  Cardiovascular: Normal rate, normal heart sounds and intact distal pulses.  Respiratory: Effort normal.  GI: Soft.  Neurological: She is alert and oriented to person, place, and time.  Skin: Skin is warm and [place].     Psychiatric: She has a normal mood and affect. Her behavior is normal. Judgment and thought content normal.    Encounter Medications:   Outpatient Encounter Medications as of 09/20/2017  Medication Sig Note  . amLODipine (NORVASC) 5 MG tablet Take 5 mg by mouth daily.   [name] Kitchen aspirin EC 81 MG tablet Take 81 mg by mouth daily.   . celecoxib (CELEBREX) 200 MG capsule Take 1 capsule (200 mg total) by mouth every 12 (twelve) hours.   . gabapentin (NEURONTIN) 100 MG capsule Take 1 capsule (100 mg total) by mouth 3 (three) times daily.   [name] Kitchen lisinopril (PRINIVIL,ZESTRIL) 20 MG  tablet Take 20 mg by mouth 2 (two) times daily.   . methocarbamol (ROBAXIN) 500 MG tablet Take 1 tablet (500 mg total) by mouth 3 (three) times daily.   . pravastatin (PRAVACHOL) 20 MG tablet Take 1 tablet (20 mg total) by mouth at bedtime.   [name] Kitchen aspirin EC 325 MG tablet Take 1 tablet (325 mg total) by mouth 2 (two) times daily. Take for 4 weeks (Patient not taking: Reported on 09/16/2017) 09/16/2017: Reports she has completed this course   . HYDROcodone-acetaminophen (NORCO) 5-325 MG tablet Take 1 tablet by mouth every 6 (six) hours as needed for moderate pain or severe pain. (Patient not taking: Reported on 09/16/2017)    No facility-administered encounter medications on file as of 09/20/2017.     Functional Status:   In your present state of health, do you have any difficulty performing the following activities: 09/16/2017 08/25/2017  Hearing? Y N  Vision? N N  Difficulty concentrating or making decisions? N N  Walking or climbing stairs? Y Y  Dressing or bathing? Y N  Comment no problem dressing taking sponge bath, working with OT on shower -  Doing errands, shopping? [name]  Comment family will provide transportation -  Conservation officer, nature and eating ? Y -  Comment needs some assistance, keeps foods on counter easily assessible , and family assist  -  Using the Toilet? N -  In the past six months, have you accidently leaked urine? N -  Do  you have problems with loss of bowel control? N -  Managing your Medications? N -  Managing your Finances? N -  Housekeeping or managing your Housekeeping? Y -  Comment family assist as needed -  Some recent data might be hidden    Fall/Depression Screening:    Fall Risk  09/05/2017  Falls in the past year? Yes  Number falls in past yr: 2 or more  Injury with Fall? Yes  Risk Factor Category  High Fall Risk  Risk for fall due to : History of fall(s);Impaired balance/gait;Impaired mobility  Follow up Falls prevention discussed   PHQ 2/9 Scores 09/05/2017  PHQ -  2 Score 0    Assessment:  Initial transition of care home visit - co visit with [place], LCSW.   Recent fall, left hip fracture, High fall risk  Patient followed by [facility] home health PT and OT, progressing with therapy, with weight bearing precautions in place. For physical therapy visit on today.  Has tried steps with therapist yet . Patient has safety equipment in place, used shower on yesterday while friend present, has shower chair. Looking into getting shower bench that will accommodate tub/and space in bathroom.Discussed benefits of balanced meals and protein,has tried ensure does not like, reviewed other nutrition supplement drinks such as boost and carnation instant breakfast.    Hypertension Taking medications as prescribed, monitors blood pressure at least weekly, home reading 136/78 Patient limits salt in diet occasional fast food.   Medical appointments  Patient reports she has cancelled her post discharge visit with PCP, stating she wants to wait until she is more mobile, able to get down her steps. Patient plans follow up with [place], orthopedist on 2/22 hoping she will have progressed with mobility by then. Reviewed importance of PCP follow up.    Plan Patient will remain active with transition of care program, next telephone outreach in a week.  Welcome packet review Provided EMMI handout of fall prevention .  Will send PCP visit note .   [facility] CM Care Plan Problem One     Most Recent Value  Care Plan Problem One  Recent hospital admission and Rehab stay related to left hip fracture   Role Documenting the Problem One  Care Management [place] for Problem One  Active  THN Long Term Goal   Patient will not experience a hospital admission within the next 31 days   THN Long Term Goal Start Date  09/16/17  Interventions for Problem One Long Term Goal  Home visit completed,   [facility] CM Short Term Goal #1   Patient will attend medical appointment in the  next 30  days   THN CM Short Term Goal #1 Start Date  09/16/17  Interventions for Short Term Goal #1  Discussed with patient importance of keeping  PCP visit after discharge with rationale and rescheduling visit    THN CM Short Term Goal #2   Patient will report no falls in the next 30 days   THN CM Short Term Goal #2 Start Date  09/16/17  Interventions for Short Term Goal #2  Home safety evaluation , reviewed EMMI handout on preventing falls,   THN CM Short Term Goal #3  Patient will report increase in mobility in the next 30 days   THN CM Short Term Goal #3 Start Date  09/20/17  Interventions for Short Tern Goal #3  Reviewed current progression with mobility, home therapy schedule and participation adherence to exercise  plan.      [name], RN, [place] Management Coordinator  [phone]- Mobile [phone]- Toll Free Main Office

## 2017-09-21 ENCOUNTER — Encounter: Payer: Self-pay | Admitting: *Deleted

## 2017-09-25 ENCOUNTER — Other Ambulatory Visit: Payer: Self-pay | Admitting: *Deleted

## 2017-09-25 ENCOUNTER — Ambulatory Visit: Payer: Self-pay | Admitting: *Deleted

## 2017-09-25 NOTE — Patient Outreach (Signed)
[facility]) Care Management  09/25/2017  Kellie Harris 1931/07/12 [id number]   Transition of care call  1456 Outreach of call , patient HIPAA information verified.  Patient reports she is expecting physical therapy for visit in the next little bit, but she is doing okay.   She requested return call later to today.   50 Placed return call to patient, no answer able to leave a HIPAA compliant message.  Plan Will return call in the next 3  day to complete transition care follow up.   [name], RN, [place] Management Coordinator  [phone]- Mobile [phone]- Toll Free Main Office

## 2017-09-27 ENCOUNTER — Other Ambulatory Visit: Payer: Self-pay | Admitting: *Deleted

## 2017-09-27 NOTE — Patient Outreach (Signed)
[facility]) Care Management  09/27/2017  Ellinor Test Zurawski 01-Feb-1931 [id number]  Transition of care call   84 yo female with recent history of fall and fracture to left hip. Recent Right TKA 08/07/17, PMHx includes, HTN, Hyperlipidemia.   Successful outreach call to patient , she reports that she is tired at this time. Patient discussed she continues to improve with home therapy, but still limits on weight bearing. Patient has completed home health occupational therapy but continues with physical therapy.  Patient has follow up visit with orthopedic doctor on next week and hoping that she will be able to bear more weight on leg, she feels her knee discomfort is because she is putting more weight on it because of limits on the left.   Patient reports appetite is pretty good, eating at least 2 meals a day with a snack in between  and has ensure that she plans to start drinking . Discussed importance of balanced, protein adequate meals for strength and healing after surgery and injury.   Plan Will plan to continue with transition of care program, next call in a week.    [name], RN, [place] Management Coordinator  [phone]- Mobile [phone]- Toll Free Main Office

## 2017-09-28 DIAGNOSIS — M8580 Other specified disorders of bone density and structure, unspecified site: Secondary | ICD-10-CM | POA: Diagnosis not present

## 2017-09-28 DIAGNOSIS — K5700 Diverticulitis of small intestine with perforation and abscess without bleeding: Secondary | ICD-10-CM | POA: Diagnosis not present

## 2017-09-28 DIAGNOSIS — M1991 Primary osteoarthritis, unspecified site: Secondary | ICD-10-CM | POA: Diagnosis not present

## 2017-09-28 DIAGNOSIS — I10 Essential (primary) hypertension: Secondary | ICD-10-CM | POA: Diagnosis not present

## 2017-09-28 DIAGNOSIS — S72142D Displaced intertrochanteric fracture of left femur, subsequent encounter for closed fracture with routine healing: Secondary | ICD-10-CM | POA: Diagnosis not present

## 2017-09-30 ENCOUNTER — Ambulatory Visit: Payer: Self-pay | Admitting: *Deleted

## 2017-09-30 DIAGNOSIS — M8580 Other specified disorders of bone density and structure, unspecified site: Secondary | ICD-10-CM | POA: Diagnosis not present

## 2017-09-30 DIAGNOSIS — S72142D Displaced intertrochanteric fracture of left femur, subsequent encounter for closed fracture with routine healing: Secondary | ICD-10-CM | POA: Diagnosis not present

## 2017-09-30 DIAGNOSIS — M1991 Primary osteoarthritis, unspecified site: Secondary | ICD-10-CM | POA: Diagnosis not present

## 2017-09-30 DIAGNOSIS — I10 Essential (primary) hypertension: Secondary | ICD-10-CM | POA: Diagnosis not present

## 2017-10-02 ENCOUNTER — Other Ambulatory Visit: Payer: Self-pay | Admitting: *Deleted

## 2017-10-02 NOTE — Patient Outreach (Signed)
[place] [facility]) Care Management  10/02/2017  Kellie Harris 06-04-1931 [id number]   Transition of care call  Successful telephone outreach call to patient , HIPAA verified. Patient discussed she is doing ok, continues with physical therapy , recent visit on yesterday, she continues to progress with therapy.  Patient discussed she continues to have more pain in left knee area, compared to left hip area fracture area. Patient discussed being anxious regarding follow up visit with orthopedic doctor on this week, hoping everything is healing as it should be .  Patient discussed appetite being a little better and she is drinking some ensure but doesn't really like looking forward to being able to get back to grocery store herself.but in the meantime she will ask her daughter to pick up another source such as boost.   Patient denies any other concerns, no falls .   Patient discussed her daughter will provide transportation to MD visit on this week.   Plan Will plan follow up transition of care call in next week.   [facility] CM Care Plan Problem One     Most Recent Value  Care Plan Problem One  Recent hospital admission and Rehab stay related to left hip fracture   Role Documenting the Problem One  Care Management [place] for Problem One  Active  THN Long Term Goal   Patient will not experience a hospital admission within the next 31 days   THN Long Term Goal Start Date  09/16/17  Interventions for Problem One Long Term Goal  Discussed with patient pain control and encouraged use of prn medication prescribed, as needed to help with pain control   THN CM Short Term Goal #1   Patient will attend medical appointment in the next 30  days   THN CM Short Term Goal #1 Start Date  09/16/17  Interventions for Short Term Goal #1  Reinforced upcoming orthopedic visit, reinforced making PCP appointment   [facility] CM Short Term Goal #2   Patient will report no falls in the next 30 days    THN CM Short Term Goal #2 Start Date  09/16/17  Interventions for Short Term Goal #2  REviewed fall precautions with teachback   THN CM Short Term Goal #3  Patient will report increase in mobility in the next 30 days   THN CM Short Term Goal #3 Start Date  09/20/17  Interventions for Short Tern Goal #3  RNCM reviewed current progress, and positive reinforcement with continuing plan       [name], RN, [place] Management Coordinator  [phone]- Mobile [phone]- [place]

## 2017-10-04 DIAGNOSIS — S72035D Nondisplaced midcervical fracture of left femur, subsequent encounter for closed fracture with routine healing: Secondary | ICD-10-CM | POA: Diagnosis not present

## 2017-10-04 DIAGNOSIS — Z96651 Presence of right artificial knee joint: Secondary | ICD-10-CM | POA: Diagnosis not present

## 2017-10-07 ENCOUNTER — Other Ambulatory Visit: Payer: Self-pay | Admitting: *Deleted

## 2017-10-07 DIAGNOSIS — M8580 Other specified disorders of bone density and structure, unspecified site: Secondary | ICD-10-CM | POA: Diagnosis not present

## 2017-10-07 DIAGNOSIS — S72142D Displaced intertrochanteric fracture of left femur, subsequent encounter for closed fracture with routine healing: Secondary | ICD-10-CM | POA: Diagnosis not present

## 2017-10-07 DIAGNOSIS — M1991 Primary osteoarthritis, unspecified site: Secondary | ICD-10-CM | POA: Diagnosis not present

## 2017-10-07 DIAGNOSIS — K5700 Diverticulitis of small intestine with perforation and abscess without bleeding: Secondary | ICD-10-CM | POA: Diagnosis not present

## 2017-10-07 DIAGNOSIS — I10 Essential (primary) hypertension: Secondary | ICD-10-CM | POA: Diagnosis not present

## 2017-10-07 NOTE — Patient Outreach (Signed)
[place] [facility]) Care Management  10/07/2017  Avia Merkley Berlinger Oct 02, 1930 [id number]  Transition of care call  Successful telephone outreach call, HIPAA information verified.  Patient discussed recent visit to orthopedic office, encouraged that she can now be weight bearing as tolerated.  Patient anticipates home health PT visit on today, to begin working with therapy on increased weight bearing. Patient states plan is for completion of home health services and she will begin, outpatient therapy at [facility] as she has in the past.  Patient reports she has been given the okay to drive again , but her family is been very cautious she does not her car at home at this time and her daughter  will go with her to the  first visit.  Patient reports appetite is about her usual, she has friends making sure she has ensure, she drinks partial containers.  Patient denies any new concerns at this time. Patient agreeable to making office visit with PCP.  Plan Will plan final transition of care call in the next week.    [name], RN, [place] Management Coordinator  [phone]- Mobile [phone]- Toll Free Main Office

## 2017-10-14 ENCOUNTER — Other Ambulatory Visit: Payer: Self-pay | Admitting: *Deleted

## 2017-10-14 NOTE — Patient Outreach (Signed)
[place] [facility]) Care Management  10/14/2017  Hayden Mabin Saidi 1930-08-14 [id number]   Transition of care final call  Unsuccessful outreach call to patient, no answer able to leave a HIPAA compliant message.for return call   Plan  Will await return call , if no response will attempt call in the next day.  [name], RN, [place] Management Coordinator  [phone]- Mobile [phone]- Toll Free Main Office

## 2017-10-17 ENCOUNTER — Other Ambulatory Visit: Payer: Self-pay | Admitting: *Deleted

## 2017-10-17 NOTE — Patient Outreach (Signed)
[place] [facility]) Care Management  10/17/2017  Kellie Harris [birth year] [id number]   Telephone follow up call  Unsuccessful telephone outreach , able to leave a HIPAA compliant message for return call.   Plan  Will plan return call in the next 2 weeks if no response today.  [name], RN, [place] Management Coordinator  [phone]- Mobile [phone]- Toll Free Main Office

## 2017-10-18 ENCOUNTER — Ambulatory Visit: Payer: PPO

## 2017-10-25 DIAGNOSIS — M25552 Pain in left hip: Secondary | ICD-10-CM | POA: Diagnosis not present

## 2017-10-25 DIAGNOSIS — M25561 Pain in right knee: Secondary | ICD-10-CM | POA: Diagnosis not present

## 2017-10-30 ENCOUNTER — Ambulatory Visit: Payer: Self-pay | Admitting: *Deleted

## 2017-10-30 ENCOUNTER — Other Ambulatory Visit: Payer: Self-pay | Admitting: *Deleted

## 2017-10-30 NOTE — Patient Outreach (Signed)
[facility]) Care Management  10/30/2017  Kellie Harris 10/07/1930 [id number]  Telephone follow up   Unsuccessful attempt to contact patient , able to leave a HIPAA compliant message for return call.  Plan Will plan return call in the next month for end of complex case management program and case closure if no new concerns.    [name], RN, [place] Management Coordinator  [phone]- Mobile [phone]- Toll Free Main Office

## 2017-11-04 ENCOUNTER — Observation Stay
Admission: EM | Admit: 2017-11-04 | Discharge: 2017-11-05 | Disposition: A | Discharge status: Home / Self Care | Payer: PPO | Attending: Internal Medicine | Admitting: Internal Medicine

## 2017-11-04 ENCOUNTER — Emergency Department: Payer: PPO

## 2017-11-04 ENCOUNTER — Encounter: Payer: Self-pay | Admitting: Emergency Medicine

## 2017-11-04 ENCOUNTER — Other Ambulatory Visit: Payer: Self-pay

## 2017-11-04 DIAGNOSIS — I671 Cerebral aneurysm, nonruptured: Secondary | ICD-10-CM | POA: Insufficient documentation

## 2017-11-04 DIAGNOSIS — Z7982 Long term (current) use of aspirin: Secondary | ICD-10-CM | POA: Diagnosis not present

## 2017-11-04 DIAGNOSIS — K589 Irritable bowel syndrome without diarrhea: Secondary | ICD-10-CM | POA: Insufficient documentation

## 2017-11-04 DIAGNOSIS — R55 Syncope and collapse: Secondary | ICD-10-CM

## 2017-11-04 DIAGNOSIS — R4182 Altered mental status, unspecified: Secondary | ICD-10-CM | POA: Insufficient documentation

## 2017-11-04 DIAGNOSIS — I10 Essential (primary) hypertension: Secondary | ICD-10-CM | POA: Insufficient documentation

## 2017-11-04 DIAGNOSIS — M19012 Primary osteoarthritis, left shoulder: Secondary | ICD-10-CM | POA: Diagnosis not present

## 2017-11-04 DIAGNOSIS — M1990 Unspecified osteoarthritis, unspecified site: Secondary | ICD-10-CM | POA: Diagnosis not present

## 2017-11-04 DIAGNOSIS — Z96651 Presence of right artificial knee joint: Secondary | ICD-10-CM | POA: Insufficient documentation

## 2017-11-04 DIAGNOSIS — M8580 Other specified disorders of bone density and structure, unspecified site: Secondary | ICD-10-CM | POA: Diagnosis not present

## 2017-11-04 DIAGNOSIS — M1711 Unilateral primary osteoarthritis, right knee: Secondary | ICD-10-CM | POA: Insufficient documentation

## 2017-11-04 DIAGNOSIS — I6503 Occlusion and stenosis of bilateral vertebral arteries: Secondary | ICD-10-CM | POA: Insufficient documentation

## 2017-11-04 DIAGNOSIS — R4701 Aphasia: Secondary | ICD-10-CM | POA: Diagnosis not present

## 2017-11-04 DIAGNOSIS — R410 Disorientation, unspecified: Secondary | ICD-10-CM | POA: Diagnosis not present

## 2017-11-04 DIAGNOSIS — Z79899 Other long term (current) drug therapy: Secondary | ICD-10-CM | POA: Diagnosis not present

## 2017-11-04 DIAGNOSIS — I4510 Unspecified right bundle-branch block: Secondary | ICD-10-CM | POA: Diagnosis not present

## 2017-11-04 DIAGNOSIS — I080 Rheumatic disorders of both mitral and aortic valves: Secondary | ICD-10-CM | POA: Diagnosis not present

## 2017-11-04 DIAGNOSIS — G459 Transient cerebral ischemic attack, unspecified: Principal | ICD-10-CM | POA: Diagnosis present

## 2017-11-04 DIAGNOSIS — G3189 Other specified degenerative diseases of nervous system: Secondary | ICD-10-CM | POA: Diagnosis not present

## 2017-11-04 DIAGNOSIS — I639 Cerebral infarction, unspecified: Secondary | ICD-10-CM

## 2017-11-04 DIAGNOSIS — Z8719 Personal history of other diseases of the digestive system: Secondary | ICD-10-CM | POA: Diagnosis not present

## 2017-11-04 DIAGNOSIS — Z66 Do not resuscitate: Secondary | ICD-10-CM | POA: Diagnosis not present

## 2017-11-04 DIAGNOSIS — E7800 Pure hypercholesterolemia, unspecified: Secondary | ICD-10-CM | POA: Insufficient documentation

## 2017-11-04 DIAGNOSIS — G9340 Encephalopathy, unspecified: Secondary | ICD-10-CM | POA: Insufficient documentation

## 2017-11-04 DIAGNOSIS — Z7902 Long term (current) use of antithrombotics/antiplatelets: Secondary | ICD-10-CM | POA: Diagnosis not present

## 2017-11-04 DIAGNOSIS — Z85828 Personal history of other malignant neoplasm of skin: Secondary | ICD-10-CM | POA: Insufficient documentation

## 2017-11-04 DIAGNOSIS — M5136 Other intervertebral disc degeneration, lumbar region: Secondary | ICD-10-CM | POA: Diagnosis not present

## 2017-11-04 LAB — COMPREHENSIVE METABOLIC PANEL
ALT: 12 U/L — ABNORMAL LOW (ref 14–54)
AST: 21 U/L (ref 15–41)
Albumin: 4.2 g/dL (ref 3.5–5.0)
Alkaline Phosphatase: 88 U/L (ref 38–126)
Anion gap: 14 (ref 5–15)
BUN: 13 mg/dL (ref 6–20)
CHLORIDE: 102 mmol/L (ref 101–111)
CO2: 20 mmol/L — AB (ref 22–32)
CREATININE: 0.84 mg/dL (ref 0.44–1.00)
Calcium: 10.2 mg/dL (ref 8.9–10.3)
GFR calc Af Amer: >60 mL/min (ref >60)
Glucose, Bld: 137 mg/dL — ABNORMAL HIGH (ref 65–99)
POTASSIUM: 3.3 mmol/L — AB (ref 3.5–5.1)
SODIUM: 136 mmol/L (ref 135–145)
Total Bilirubin: 0.8 mg/dL (ref 0.3–1.2)
Total Protein: 6.9 g/dL (ref 6.5–8.1)

## 2017-11-04 LAB — CBC WITH DIFFERENTIAL/PLATELET
Basophils Absolute: 0.1 10*3/uL (ref 0–0.1)
Basophils Relative: 1 %
EOS ABS: 0.3 10*3/uL (ref 0–0.7)
Eosinophils Relative: 3 %
HCT: 36.2 % (ref 35.0–47.0)
HEMOGLOBIN: 12.3 g/dL (ref 12.0–16.0)
LYMPHS ABS: 1.1 10*3/uL (ref 1.0–3.6)
LYMPHS PCT: 9 %
MCH: 28.3 pg (ref 26.0–34.0)
MCHC: 34.1 g/dL (ref 32.0–36.0)
MCV: 83 fL (ref 80.0–100.0)
MONOS PCT: 5 %
Monocytes Absolute: 0.6 10*3/uL (ref 0.2–0.9)
Neutro Abs: 9.9 10*3/uL — ABNORMAL HIGH (ref 1.4–6.5)
Neutrophils Relative %: 82 %
Platelets: 270 10*3/uL (ref 150–440)
RBC: 4.36 MIL/uL (ref 3.80–5.20)
RDW: 15 % — ABNORMAL HIGH (ref 11.5–14.5)
WBC: 12 10*3/uL — AB (ref 3.6–11.0)

## 2017-11-04 LAB — URINE DRUG SCREEN, QUALITATIVE (ARMC ONLY)
Amphetamines, Ur Screen: NOT DETECTED
BENZODIAZEPINE, UR SCRN: POSITIVE — AB
Barbiturates, Ur Screen: NOT DETECTED
Cannabinoid 50 Ng, Ur ~~LOC~~: NOT DETECTED
Cocaine Metabolite,Ur ~~LOC~~: NOT DETECTED
MDMA (Ecstasy)Ur Screen: NOT DETECTED
Methadone Scn, Ur: NOT DETECTED
Opiate, Ur Screen: NOT DETECTED
Phencyclidine (PCP) Ur S: NOT DETECTED
TRICYCLIC, UR SCREEN: NOT DETECTED

## 2017-11-04 LAB — APTT: APTT: 30 s (ref 24–36)

## 2017-11-04 LAB — TROPONIN I

## 2017-11-04 LAB — URINALYSIS, ROUTINE W REFLEX MICROSCOPIC
BACTERIA UA: NONE SEEN
BILIRUBIN URINE: NEGATIVE
Glucose, UA: NEGATIVE mg/dL
KETONES UR: NEGATIVE mg/dL
NITRITE: NEGATIVE
PH: 7 (ref 5.0–8.0)
PROTEIN: NEGATIVE mg/dL
Specific Gravity, Urine: 1.014 (ref 1.005–1.030)
Squamous Epithelial / LPF: NONE SEEN

## 2017-11-04 LAB — GLUCOSE, CAPILLARY: Glucose-Capillary: 112 mg/dL — ABNORMAL HIGH (ref 65–99)

## 2017-11-04 LAB — PROTIME-INR
INR: 0.99
Prothrombin Time: 13 seconds (ref 11.4–15.2)

## 2017-11-04 MED ORDER — ACETAMINOPHEN 325 MG PO TABS
650.0000 mg | ORAL_TABLET | ORAL | Status: DC | PRN
  Administered 2017-11-05: 650 mg via ORAL
  Filled 2017-11-04: qty 2

## 2017-11-04 MED ORDER — AMLODIPINE BESYLATE 5 MG PO TABS
5.0000 mg | ORAL_TABLET | Freq: Every day | ORAL | Status: DC
  Administered 2017-11-05: 5 mg via ORAL
  Filled 2017-11-04: qty 1

## 2017-11-04 MED ORDER — ACETAMINOPHEN 160 MG/5ML PO SOLN
650.0000 mg | ORAL | Status: DC | PRN
  Filled 2017-11-04: qty 20.3

## 2017-11-04 MED ORDER — MIDAZOLAM HCL 5 MG/5ML IJ SOLN
INTRAMUSCULAR | Status: AC
  Filled 2017-11-04: qty 5

## 2017-11-04 MED ORDER — KETAMINE HCL 10 MG/ML IJ SOLN
1.0000 mg/kg | Freq: Once | INTRAMUSCULAR | Status: AC
  Administered 2017-11-04: 43 mg via INTRAVENOUS
  Filled 2017-11-04: qty 1

## 2017-11-04 MED ORDER — STROKE: EARLY STAGES OF RECOVERY BOOK
Freq: Once | Status: AC
  Administered 2017-11-04: 23:00:00

## 2017-11-04 MED ORDER — SODIUM CHLORIDE 0.9 % IV BOLUS
500.0000 mL | Freq: Once | INTRAVENOUS | Status: AC
  Administered 2017-11-04 (×2): 500 mL via INTRAVENOUS

## 2017-11-04 MED ORDER — ASPIRIN 300 MG RE SUPP
300.0000 mg | Freq: Every day | RECTAL | Status: DC
Start: 2017-11-05 — End: 2017-11-05

## 2017-11-04 MED ORDER — MIDAZOLAM HCL 5 MG/5ML IJ SOLN
0.5000 mg | Freq: Once | INTRAMUSCULAR | Status: AC
  Administered 2017-11-04: 0.5 mg via INTRAVENOUS

## 2017-11-04 MED ORDER — LISINOPRIL 20 MG PO TABS
20.0000 mg | ORAL_TABLET | Freq: Two times a day (BID) | ORAL | Status: DC
  Administered 2017-11-04 – 2017-11-05 (×2): 20 mg via ORAL
  Filled 2017-11-04 (×2): qty 1

## 2017-11-04 MED ORDER — ACETAMINOPHEN 650 MG RE SUPP: 650.0000 mg | RECTAL | Status: DC | PRN

## 2017-11-04 MED ORDER — GABAPENTIN 400 MG PO CAPS
400.0000 mg | ORAL_CAPSULE | Freq: Three times a day (TID) | ORAL | Status: DC
  Administered 2017-11-04: 400 mg via ORAL
  Filled 2017-11-04: qty 1
  Filled 2017-11-04: qty 4
  Filled 2017-11-04: qty 1
  Filled 2017-11-04: qty 4
  Filled 2017-11-04 (×2): qty 1

## 2017-11-04 MED ORDER — PRAVASTATIN SODIUM 20 MG PO TABS
20.0000 mg | ORAL_TABLET | Freq: Every day | ORAL | Status: DC
  Administered 2017-11-04: 20 mg via ORAL
  Filled 2017-11-04: qty 1

## 2017-11-04 MED ORDER — IOPAMIDOL (ISOVUE-370) INJECTION 76%
100.0000 mL | Freq: Once | INTRAVENOUS | Status: AC | PRN
  Administered 2017-11-04: 100 mL via INTRAVENOUS

## 2017-11-04 MED ORDER — ASPIRIN 325 MG PO TABS
325.0000 mg | ORAL_TABLET | Freq: Every day | ORAL | Status: DC
  Administered 2017-11-05: 11:00:00 325 mg via ORAL
  Filled 2017-11-04: qty 1

## 2017-11-04 MED ORDER — SODIUM CHLORIDE 0.9 % IV SOLN
100.0000 mL/h | INTRAVENOUS | Status: DC
  Administered 2017-11-04: 100 mL/h via INTRAVENOUS

## 2017-11-04 MED ORDER — ENOXAPARIN SODIUM 40 MG/0.4ML ~~LOC~~ SOLN
40.0000 mg | SUBCUTANEOUS | Status: DC
  Administered 2017-11-04: 40 mg via SUBCUTANEOUS
  Filled 2017-11-04: qty 0.4

## 2017-11-04 NOTE — Progress Notes (Signed)
Advanced care plan.  Purpose of the Encounter: CODE STATUS  Parties in Attendance:Patient and daughter  Patient's Decision Capacity:Good  Subjective/Patient's story: Nearly passed out in [employer] store Difficulty in speech  Objective/Medical story Speech back to baseline Check mri brain, carotid usd and echo  Goals of care determination:  Advanced directives discussed with patient and patient's daughter in detail Patient does not want any cardiac resuscitation, intubation and ventilator if the need arises.  CODE STATUS: DNR  Time spent discussing advanced care planning: 16 minutes

## 2017-11-04 NOTE — ED Notes (Signed)
NIH scale completed at 1615 - not charted on computer until 1925

## 2017-11-04 NOTE — ED Notes (Signed)
aldrete score charted at 1650 is at wrong time should be 1850. Unable to delete or change.

## 2017-11-04 NOTE — ED Notes (Signed)
CODE STROKE CALLED TO [phone] 

## 2017-11-04 NOTE — Progress Notes (Signed)
   11/04/17 1635  Clinical Encounter Type  Visited With Other (Comment)  Visit Type Code;ED  Referral From Nurse  Consult/Referral To Chaplain  Spiritual Encounters  Spiritual Needs Prayer   CH received a PG for a Code Stroke. [place] reported to the RM and found PT being evaluated for Stroke symptoms. [place] will follow up as needed.

## 2017-11-04 NOTE — ED Notes (Signed)
Patient transported to CT 

## 2017-11-04 NOTE — Consult Note (Addendum)
[facility] TeleNeurology Consult Services  Asked to see this patient in telemedicine consultation. Consultation was performed with assistance of ancillary/medical staff at bedside.  Comments: Last Known normal  unclear Door Time: 0102 [facility] Contacted: [phone] [facility] first log in: 1700 NIHSS assessment time: 6644 Needle Time: no Iv tpa Call back time: 1700  HPI:  68 yof with near syncopal episode witnessed while shopping at [employer]. She drove to the store and EMS was called and brought her to ED for evaluation. While in ED she was noted to be confused and not following commands, EMS didn't noticed any difference so onset of ams unclear.  She underwent ct scan head which was negative per radiology report.  VSS  Gen Wn/Wd in Nad  TeleStroke Assessment: LOC:   0 LOC questions:  2 LOC Commands :   2 Gaze : 0 Visual fields :  0  Facial movements : 0 Upper limb Motor  0 Lower limb Motor  3 Limb Coordination  - 0 Sensory -  - 0 Language -  3 Speech -   0 Neglect / extinction -  0  NIHSS Score: 8    IMPRESSION  AMS/encephalopathy of unclear etiology RO Stroke Near syncope  Medical Decision Making:   Patient is not candidate for alteplase due to unclear onset of sxs.  Not an IR candidate as low clinical suspicion for LVO by neurologic assessment.   Recommendations: - Daily antithrombotics to initiate now if no contraindication.  - agree with advanced imaging as hx is limited and not iv tpa candidate with potential aphasia on assessment. - Further work up with Stroke labs, MRI brain, ECHO, NIVS Carotid will be deferred to inpt neurology service -  Needs Inpatient Neurology consultation and follow up  - Thank you for allowing [place] to participate in the care of your patient, if there are any questions please don't hesitate to contact us  Discussed plan of care with  hospital staff   Physician: Sylvan Cheese, DO   [facility]  CTA  negative for LVO CTP negative for acute ischemia Admit for medical for work up

## 2017-11-04 NOTE — Progress Notes (Signed)
Pharmacist responded to Code stroke. Let RN Bill in ED know to call [phone] if pharmacist could be of any assistance.   Thomasenia Sales, PharmD, MBA, [facility]

## 2017-11-04 NOTE — H&P (Signed)
[facility] NAME: Kellie Harris    MR#:  [id number]  DATE OF BIRTH:  Mar 12, 1931  DATE OF ADMISSION:  11/04/2017  PRIMARY CARE PHYSICIAN: Dion Body, MD   REQUESTING/REFERRING PHYSICIAN:   CHIEF COMPLAINT:  Nearly passed out  HISTORY OF PRESENT ILLNESS: Kellie Harris  is a 84 y.o. female with a known history of hyperlipidemia, hypertension, degenerative disc disease presented to the emergency room after she was about to pass out.  Patient had a near syncopal episode and she was brought to the emergency room by EMS.  Patient had this episode at the [facility] store.  After she arrived to the emergency room she was noted to be confused not following any commands and she had trouble expressing words patient was worked up with CT head which showed no acute abnormality.  Later on her mental status improved she is back to baseline and with no difficulty in speech.  Patient is hard of hearing and uses hearing aids.  Patient was evaluated by tele neurology who recommended no TPA.  Advised to start patient on oral aspirin, inpatient neurology consultation and check MRI brain and echocardiogram no tingling or numbness in any part of the body. No weakness in any part of the body.  Patient also had CTA neck which showed no acute abnormality.  PAST MEDICAL HISTORY:   Past Medical History:  Diagnosis Date  . Arthritis    right knee, left shoulder/ OSTEO  . Cancer ([address])    skin   . Degenerative disc disease, lumbar    L4-L5  . Diverticulosis    ITIS  . History of hiatal hernia   . Hydronephrosis    right  . Hypercholesteremia   . Hypertension    CONTROLLED ON MEDS  . IBS (irritable bowel syndrome)   . Knee fracture, right 03/05/2015   DIFFICULTY WITH AMBULATION  . Osteopenia   . Ovarian cyst   . Pancreatic insufficiency   . Wears hearing aid    bilateral    PAST SURGICAL HISTORY:  Past Surgical History:  Procedure Laterality  Date  . CATARACT EXTRACTION W/PHACO Right 11/02/2015   Procedure: CATARACT EXTRACTION PHACO AND INTRAOCULAR LENS PLACEMENT (IOC);  Surgeon: Leandrew Koyanagi, MD;  Location: [facility];  Service: Ophthalmology;  Laterality: Right;  . CATARACT EXTRACTION W/PHACO Left 07/18/2016   Procedure: CATARACT EXTRACTION PHACO AND INTRAOCULAR LENS PLACEMENT (IOC);  Surgeon: Leandrew Koyanagi, MD;  Location: [facility];  Service: Ophthalmology;  Laterality: Left;  . COLONOSCOPY    . FRACTURE SURGERY Left    hip  . INTRAMEDULLARY (IM) NAIL INTERTROCHANTERIC Left 02/12/2017   Procedure: INTRAMEDULLARY (IM) NAIL INTERTROCHANTRIC;  Surgeon: Earnestine Leys, MD;  Location: [facility];  Service: Orthopedics;  Laterality: Left;  . kidney stent    . OOPHORECTOMY Bilateral 2015  . TONSILLECTOMY    . TOTAL KNEE ARTHROPLASTY Right 08/07/2017   Procedure: TOTAL KNEE ARTHROPLASTY;  Surgeon: Earnestine Leys, MD;  Location: [facility];  Service: Orthopedics;  Laterality: Right;    SOCIAL HISTORY:  Social History   Tobacco Use  . Smoking status: Never Smoker  . Smokeless tobacco: Never Used  Substance Use Topics  . Alcohol use: No    FAMILY HISTORY:  Family History  Problem Relation Age of Onset  . Breast cancer Mother 54  . Leukemia Mother   . Stroke Father     DRUG ALLERGIES: No Known Allergies  REVIEW OF SYSTEMS:  CONSTITUTIONAL: No fever, fatigue or weakness.  EYES: No blurred or double vision.  EARS, NOSE, AND THROAT: No tinnitus or ear pain.  RESPIRATORY: No cough, shortness of breath, wheezing or hemoptysis.  CARDIOVASCULAR: No chest pain, orthopnea, edema.  GASTROINTESTINAL: No nausea, vomiting, diarrhea or abdominal pain.  GENITOURINARY: No dysuria, hematuria.  ENDOCRINE: No polyuria, nocturia,  HEMATOLOGY: No anemia, easy bruising or bleeding SKIN: No rash or lesion. MUSCULOSKELETAL: No joint pain or arthritis.   NEUROLOGIC: No tingling, numbness, weakness.  PSYCHIATRY:  No anxiety or depression.   MEDICATIONS AT HOME:  Prior to Admission medications   Medication Sig Start Date End Date Taking? Authorizing Provider  celecoxib (CELEBREX) 200 MG capsule Take 1 capsule (200 mg total) by mouth every 12 (twelve) hours. 08/09/17  Yes Earnestine Leys, MD  gabapentin (NEURONTIN) 100 MG capsule Take 1 capsule (100 mg total) by mouth 3 (three) times daily. Patient taking differently: Take 400 mg by mouth 3 (three) times daily.  08/09/17  Yes Earnestine Leys, MD  lisinopril (PRINIVIL,ZESTRIL) 20 MG tablet Take 20 mg by mouth 2 (two) times daily.   Yes [provider]  methocarbamol (ROBAXIN) 500 MG tablet Take 1 tablet (500 mg total) by mouth 3 (three) times daily. Patient taking differently: Take 500 mg by mouth 2 (two) times daily.  08/09/17  Yes Earnestine Leys, MD  pravastatin (PRAVACHOL) 20 MG tablet Take 1 tablet (20 mg total) by mouth at bedtime. 08/09/17  Yes Earnestine Leys, MD  amLODipine (NORVASC) 5 MG tablet Take 5 mg by mouth daily.    [provider]  aspirin EC 325 MG tablet Take 1 tablet (325 mg total) by mouth 2 (two) times daily. Take for 4 weeks Patient not taking: Reported on 09/16/2017 08/09/17   Earnestine Leys, MD  aspirin EC 81 MG tablet Take 81 mg by mouth daily.    [provider]  HYDROcodone-acetaminophen (NORCO) 5-325 MG tablet Take 1 tablet by mouth every 6 (six) hours as needed for moderate pain or severe pain. Patient not taking: Reported on 09/16/2017 08/27/17   Hillary Bow, MD      PHYSICAL EXAMINATION:   VITAL SIGNS: Blood pressure (!) 161/89, pulse 95, temperature 98 F (36.7 C), resp. rate 20, height 4\' 11"  (1.499 m), weight 43.1 kg (95 lb), SpO2 95 %.  GENERAL:  84 y.o.-year-old patient lying in the bed with no acute distress.  EYES: Pupils equal, round, reactive to light and accommodation. No scleral icterus. Extraocular muscles intact.  HEENT: Head atraumatic, normocephalic. Oropharynx and nasopharynx  clear.  NECK:  Supple, no jugular venous distention. No thyroid enlargement, no tenderness.  LUNGS: Normal breath sounds bilaterally, no wheezing, rales,rhonchi or crepitation. No use of accessory muscles of respiration.  CARDIOVASCULAR: S1, S2 normal. No murmurs, rubs, or gallops.  ABDOMEN: Soft, nontender, nondistended. Bowel sounds present. No organomegaly or mass.  EXTREMITIES: No pedal edema, cyanosis, or clubbing.  NEUROLOGIC: Cranial nerves II through XII are intact. Muscle strength 5/5 in all extremities. Sensation intact. Gait not checked.  PSYCHIATRIC: The patient is alert and oriented x 3.  SKIN: No obvious rash, lesion, or ulcer.   LABORATORY PANEL:   CBC Recent Labs  Lab 11/04/17 1628  WBC 12.0*  HGB 12.3  HCT 36.2  PLT 270  MCV 83.0  MCH 28.3  MCHC 34.1  RDW 15.0*  LYMPHSABS 1.1  MONOABS 0.6  EOSABS 0.3  BASOSABS 0.1   ------------------------------------------------------------------------------------------------------------------  Chemistries  Recent Labs  Lab 11/04/17 1628  NA 136  K 3.3*  CL 102  CO2 20*  GLUCOSE 137*  BUN 13  CREATININE 0.84  CALCIUM 10.2  AST 21  ALT 12*  ALKPHOS 88  BILITOT 0.8   ------------------------------------------------------------------------------------------------------------------ estimated creatinine clearance is 33.9 mL/min (by C-G formula based on SCr of 0.84 mg/dL). ------------------------------------------------------------------------------------------------------------------ No results for input(s): TSH, T4TOTAL, T3FREE, THYROIDAB in the last 72 hours.  Invalid input(s): FREET3   Coagulation profile Recent Labs  Lab 11/04/17 1628  INR 0.99   ------------------------------------------------------------------------------------------------------------------- No results for input(s): DDIMER in the last 72  hours. -------------------------------------------------------------------------------------------------------------------  Cardiac Enzymes Recent Labs  Lab 11/04/17 1628  TROPONINI <0.03   ------------------------------------------------------------------------------------------------------------------ Invalid input(s): POCBNP  ---------------------------------------------------------------------------------------------------------------  Urinalysis    Component Value Date/Time   COLORURINE YELLOW 08/22/2017 1630   APPEARANCEUR CLEAR 08/22/2017 1630   APPEARANCEUR Clear 07/08/2014 2155   LABSPEC 1.015 08/22/2017 1630   LABSPEC 1.003 07/08/2014 2155   PHURINE 5.5 08/22/2017 1630   GLUCOSEU NEGATIVE 08/22/2017 1630   GLUCOSEU Negative 07/08/2014 2155   HGBUR NEGATIVE 08/22/2017 1630   BILIRUBINUR NEGATIVE 08/22/2017 1630   BILIRUBINUR Negative 07/08/2014 2155   KETONESUR NEGATIVE 08/22/2017 1630   PROTEINUR NEGATIVE 08/22/2017 1630   NITRITE NEGATIVE 08/22/2017 1630   LEUKOCYTESUR NEGATIVE 08/22/2017 1630   LEUKOCYTESUR Negative 07/08/2014 2155     RADIOLOGY: Ct Angio Head W Or Wo Contrast  Result Date: 11/04/2017 CLINICAL DATA:  Initial evaluation for acute altered mental status, syncopal episode. EXAM: CT ANGIOGRAPHY HEAD AND NECK CT PERFUSION BRAIN TECHNIQUE: Multidetector CT imaging of the head and neck was performed using the standard protocol during bolus administration of intravenous contrast. Multiplanar CT image reconstructions and MIPs were obtained to evaluate the vascular anatomy. Carotid stenosis measurements (when applicable) are obtained utilizing NASCET criteria, using the distal internal carotid diameter as the denominator. Multiphase CT imaging of the brain was performed following IV bolus contrast injection. Subsequent parametric perfusion maps were calculated using RAPID software. CONTRAST:  134mL ISOVUE-370 IOPAMIDOL (ISOVUE-370) INJECTION 76% COMPARISON:   Prior CT from earlier same day. FINDINGS: CTA NECK FINDINGS Aortic arch: Visualized aortic arch of normal caliber with normal 3 vessel morphology. No flow-limiting stenosis about the origin of the great vessels. Scattered atheromatous plaque noted within the arch itself. Visualized subclavian arteries widely patent. Right carotid system: Right common carotid artery tortuous proximally but widely patent to the bifurcation. Minimal plaque about the right carotid bifurcation without stenosis. Right ICA widely patent from the bifurcation to the skull base without stenosis, dissection, or occlusion. Left carotid system: Left common carotid artery tortuous proximally but widely patent to the bifurcation without stenosis. Scattered calcified plaque about the left bifurcation without significant atheromatous narrowing. Left ICA patent from the bifurcation to the skull base without stenosis, dissection or occlusion. Vertebral arteries: Both of the vertebral arteries arise from the subclavian arteries. Right vertebral artery dominant. Vertebral arteries widely patent within the neck without stenosis, dissection, or occlusion. Skeleton: No acute osseus abnormality. No worrisome lytic or blastic osseous lesions. Moderate degenerative spondylolysis present at C3-4 through C6-7. Other neck: No acute soft tissue abnormality within the neck. Salivary glands within normal limits. No adenopathy. Chronic left maxillary sinusitis noted. Thyroid normal. Upper chest: Visualized lungs are grossly clear. Review of the MIP images confirms the above findings CTA HEAD FINDINGS Anterior circulation: Petrous segments widely patent bilaterally. Scattered atheromatous plaque within the cavernous/supraclinoid ICAs without significant stenosis. ICA termini widely patent. A1 segments patent bilaterally. Normal anterior communicating artery. Anterior cerebral arteries widely patent to their distal  aspects without stenosis. Left M1 segment widely  patent without stenosis. No proximal M2 occlusion. Distal left MCA branches well perfused. Right M1 segment patent without stenosis. Approximate 3 mm focal outpouching arising from the mid right M1 segment suspicious for small aneurysm (series 8, image 98). Normal right MCA bifurcation. No proximal right M2 occlusion. Distal right MCA branches well perfused. Posterior circulation: Right vertebral artery dominant. Focal mild stenosis within the left V4 segment as it crosses into the cranial vault (series 8, image 143). Additional atheromatous plaque within the bilateral V4 segments with mild to moderate stenoses. Posterior inferior cerebral arteries patent bilaterally. Basilar artery widely patent to its distal aspect. Superior cerebral arteries patent bilaterally. Both of the posterior cerebral arteries primarily supplied via the basilar and are widely patent to their distal aspects without stenosis. Venous sinuses: Patent. Anatomic variants: None significant. Delayed phase: Not performed. Review of the MIP images confirms the above findings CT Brain Perfusion Findings: CBF (<30%) Volume: 71mL Perfusion (Tmax>6.0s) volume: 21mL Mismatch Volume: 47mL Infarction Location:No acute stroke by CT perfusion. IMPRESSION: 1. Negative CTA for emergent large vessel occlusion. No evidence for acute stroke by CT perfusion. 2. Mild atheromatous change involving the major arterial vasculature of the head and neck as above, most notable within the bilateral V4 segments were there are mild to moderate stenoses. No other hemodynamically significant or correctable stenosis identified. 3. 3 mm focal outpouching arising from the right M1 segment, suspicious for aneurysm. Electronically Signed   By: Jeannine Boga M.D.   On: 11/04/2017 19:55   Ct Angio Neck W Or Wo Contrast  Result Date: 11/04/2017 CLINICAL DATA:  Initial evaluation for acute altered mental status, syncopal episode. EXAM: CT ANGIOGRAPHY HEAD AND NECK CT PERFUSION  BRAIN TECHNIQUE: Multidetector CT imaging of the head and neck was performed using the standard protocol during bolus administration of intravenous contrast. Multiplanar CT image reconstructions and MIPs were obtained to evaluate the vascular anatomy. Carotid stenosis measurements (when applicable) are obtained utilizing NASCET criteria, using the distal internal carotid diameter as the denominator. Multiphase CT imaging of the brain was performed following IV bolus contrast injection. Subsequent parametric perfusion maps were calculated using RAPID software. CONTRAST:  11mL ISOVUE-370 IOPAMIDOL (ISOVUE-370) INJECTION 76% COMPARISON:  Prior CT from earlier same day. FINDINGS: CTA NECK FINDINGS Aortic arch: Visualized aortic arch of normal caliber with normal 3 vessel morphology. No flow-limiting stenosis about the origin of the great vessels. Scattered atheromatous plaque noted within the arch itself. Visualized subclavian arteries widely patent. Right carotid system: Right common carotid artery tortuous proximally but widely patent to the bifurcation. Minimal plaque about the right carotid bifurcation without stenosis. Right ICA widely patent from the bifurcation to the skull base without stenosis, dissection, or occlusion. Left carotid system: Left common carotid artery tortuous proximally but widely patent to the bifurcation without stenosis. Scattered calcified plaque about the left bifurcation without significant atheromatous narrowing. Left ICA patent from the bifurcation to the skull base without stenosis, dissection or occlusion. Vertebral arteries: Both of the vertebral arteries arise from the subclavian arteries. Right vertebral artery dominant. Vertebral arteries widely patent within the neck without stenosis, dissection, or occlusion. Skeleton: No acute osseus abnormality. No worrisome lytic or blastic osseous lesions. Moderate degenerative spondylolysis present at C3-4 through C6-7. Other neck: No  acute soft tissue abnormality within the neck. Salivary glands within normal limits. No adenopathy. Chronic left maxillary sinusitis noted. Thyroid normal. Upper chest: Visualized lungs are grossly clear. Review of the MIP images confirms  the above findings CTA HEAD FINDINGS Anterior circulation: Petrous segments widely patent bilaterally. Scattered atheromatous plaque within the cavernous/supraclinoid ICAs without significant stenosis. ICA termini widely patent. A1 segments patent bilaterally. Normal anterior communicating artery. Anterior cerebral arteries widely patent to their distal aspects without stenosis. Left M1 segment widely patent without stenosis. No proximal M2 occlusion. Distal left MCA branches well perfused. Right M1 segment patent without stenosis. Approximate 3 mm focal outpouching arising from the mid right M1 segment suspicious for small aneurysm (series 8, image 98). Normal right MCA bifurcation. No proximal right M2 occlusion. Distal right MCA branches well perfused. Posterior circulation: Right vertebral artery dominant. Focal mild stenosis within the left V4 segment as it crosses into the cranial vault (series 8, image 143). Additional atheromatous plaque within the bilateral V4 segments with mild to moderate stenoses. Posterior inferior cerebral arteries patent bilaterally. Basilar artery widely patent to its distal aspect. Superior cerebral arteries patent bilaterally. Both of the posterior cerebral arteries primarily supplied via the basilar and are widely patent to their distal aspects without stenosis. Venous sinuses: Patent. Anatomic variants: None significant. Delayed phase: Not performed. Review of the MIP images confirms the above findings CT Brain Perfusion Findings: CBF (<30%) Volume: 14mL Perfusion (Tmax>6.0s) volume: 30mL Mismatch Volume: 44mL Infarction Location:No acute stroke by CT perfusion. IMPRESSION: 1. Negative CTA for emergent large vessel occlusion. No evidence for acute  stroke by CT perfusion. 2. Mild atheromatous change involving the major arterial vasculature of the head and neck as above, most notable within the bilateral V4 segments were there are mild to moderate stenoses. No other hemodynamically significant or correctable stenosis identified. 3. 3 mm focal outpouching arising from the right M1 segment, suspicious for aneurysm. Electronically Signed   By: Jeannine Boga M.D.   On: 11/04/2017 19:55   Ct Cerebral Perfusion W Contrast  Result Date: 11/04/2017 CLINICAL DATA:  Initial evaluation for acute altered mental status, syncopal episode. EXAM: CT ANGIOGRAPHY HEAD AND NECK CT PERFUSION BRAIN TECHNIQUE: Multidetector CT imaging of the head and neck was performed using the standard protocol during bolus administration of intravenous contrast. Multiplanar CT image reconstructions and MIPs were obtained to evaluate the vascular anatomy. Carotid stenosis measurements (when applicable) are obtained utilizing NASCET criteria, using the distal internal carotid diameter as the denominator. Multiphase CT imaging of the brain was performed following IV bolus contrast injection. Subsequent parametric perfusion maps were calculated using RAPID software. CONTRAST:  148mL ISOVUE-370 IOPAMIDOL (ISOVUE-370) INJECTION 76% COMPARISON:  Prior CT from earlier same day. FINDINGS: CTA NECK FINDINGS Aortic arch: Visualized aortic arch of normal caliber with normal 3 vessel morphology. No flow-limiting stenosis about the origin of the great vessels. Scattered atheromatous plaque noted within the arch itself. Visualized subclavian arteries widely patent. Right carotid system: Right common carotid artery tortuous proximally but widely patent to the bifurcation. Minimal plaque about the right carotid bifurcation without stenosis. Right ICA widely patent from the bifurcation to the skull base without stenosis, dissection, or occlusion. Left carotid system: Left common carotid artery tortuous  proximally but widely patent to the bifurcation without stenosis. Scattered calcified plaque about the left bifurcation without significant atheromatous narrowing. Left ICA patent from the bifurcation to the skull base without stenosis, dissection or occlusion. Vertebral arteries: Both of the vertebral arteries arise from the subclavian arteries. Right vertebral artery dominant. Vertebral arteries widely patent within the neck without stenosis, dissection, or occlusion. Skeleton: No acute osseus abnormality. No worrisome lytic or blastic osseous lesions. Moderate degenerative spondylolysis present at  C3-4 through C6-7. Other neck: No acute soft tissue abnormality within the neck. Salivary glands within normal limits. No adenopathy. Chronic left maxillary sinusitis noted. Thyroid normal. Upper chest: Visualized lungs are grossly clear. Review of the MIP images confirms the above findings CTA HEAD FINDINGS Anterior circulation: Petrous segments widely patent bilaterally. Scattered atheromatous plaque within the cavernous/supraclinoid ICAs without significant stenosis. ICA termini widely patent. A1 segments patent bilaterally. Normal anterior communicating artery. Anterior cerebral arteries widely patent to their distal aspects without stenosis. Left M1 segment widely patent without stenosis. No proximal M2 occlusion. Distal left MCA branches well perfused. Right M1 segment patent without stenosis. Approximate 3 mm focal outpouching arising from the mid right M1 segment suspicious for small aneurysm (series 8, image 98). Normal right MCA bifurcation. No proximal right M2 occlusion. Distal right MCA branches well perfused. Posterior circulation: Right vertebral artery dominant. Focal mild stenosis within the left V4 segment as it crosses into the cranial vault (series 8, image 143). Additional atheromatous plaque within the bilateral V4 segments with mild to moderate stenoses. Posterior inferior cerebral arteries patent  bilaterally. Basilar artery widely patent to its distal aspect. Superior cerebral arteries patent bilaterally. Both of the posterior cerebral arteries primarily supplied via the basilar and are widely patent to their distal aspects without stenosis. Venous sinuses: Patent. Anatomic variants: None significant. Delayed phase: Not performed. Review of the MIP images confirms the above findings CT Brain Perfusion Findings: CBF (<30%) Volume: 35mL Perfusion (Tmax>6.0s) volume: 35mL Mismatch Volume: 9mL Infarction Location:No acute stroke by CT perfusion. IMPRESSION: 1. Negative CTA for emergent large vessel occlusion. No evidence for acute stroke by CT perfusion. 2. Mild atheromatous change involving the major arterial vasculature of the head and neck as above, most notable within the bilateral V4 segments were there are mild to moderate stenoses. No other hemodynamically significant or correctable stenosis identified. 3. 3 mm focal outpouching arising from the right M1 segment, suspicious for aneurysm. Electronically Signed   By: Jeannine Boga M.D.   On: 11/04/2017 19:55   Ct Head Code Stroke Wo Contrast  Result Date: 11/04/2017 CLINICAL DATA:  Code stroke. Aphasia and confusion developed over the last hour. EXAM: CT HEAD WITHOUT CONTRAST TECHNIQUE: Contiguous axial images were obtained from the base of the skull through the vertex without intravenous contrast. COMPARISON:  MRI 02/11/2017 FINDINGS: Brain: Generalized brain atrophy. Chronic small-vessel ischemic changes throughout the cerebral hemispheric white matter. Old lacunar infarctions in the right thalamus and right frontal white matter. No sign of acute infarction, mass lesion, hemorrhage, hydrocephalus or extra-axial collection. Vascular: There is atherosclerotic calcification of the major vessels at the base of the brain. Skull: Negative Sinuses/Orbits: Left maxillary sinus inflammatory changes. Other sinuses are clear. Orbits negative. Other: None  ASPECTS ([facility] Stroke Program Early CT Score) - Ganglionic level infarction (caudate, lentiform nuclei, internal capsule, insula, M1-M3 cortex): 7 - Supraganglionic infarction (M4-M6 cortex): 3 Total score (0-10 with 10 being normal): 10 IMPRESSION: 1. No acute finding by CT. Atrophy and extensive chronic ischemic changes seen throughout as above. 2. ASPECTS is 10. 3. These results were communicated to [facility] [place] 11/04/2017 via telephone. Electronically Signed   By: Nelson Chimes M.D.   On: 11/04/2017 16:47    EKG: Orders placed or performed during the hospital encounter of 11/04/17  . EKG 12-Lead  . EKG 12-Lead  . ED EKG  . ED EKG    IMPRESSION AND PLAN:  84 year old elderly female patient with history of degenerative disc disease,  hypertension, hyperlipidemia presented to the emergency room with difficulty in expressing words and near syncope.  1. Near syncope Admit patient to medical floor with cardiac monitoring Check for any erythremia  2.  Expressive aphasia Is improved Observation MRI brain and carotid ultrasound Echocardiogram Neurology consultation  3.  Hypertension Resume Norvasc and ACE inhibitor for blood pressure control  4.  DVT prophylaxis with subcu Lovenox daily  All the records are reviewed and case discussed with ED provider. Management plans discussed with the patient, family and they are in agreement.  CODE STATUS:DNR    Code Status Orders  (From admission, onward)        Start     Ordered   11/04/17 2054  Do not attempt resuscitation (DNR)  Continuous    Question Answer Comment  In the event of cardiac or respiratory ARREST Do not call a "code blue"   In the event of cardiac or respiratory ARREST Do not perform Intubation, CPR, defibrillation or ACLS   In the event of cardiac or respiratory ARREST Use medication by any route, position, wound care, and other measures to relive pain and suffering. May use oxygen, suction and manual  treatment of airway obstruction as needed for comfort.      11/04/17 2053    Code Status History    Date Active Date Inactive Code Status Order ID Comments User Context   08/25/2017 0114 08/27/2017 1950 Full Code [id number]  Lance Coon, MD Inpatient   08/07/2017 1217 08/09/2017 1954 Full Code [id number]  Earnestine Leys, MD Inpatient   02/12/2017 0140 02/14/2017 2023 Full Code [id number]  Lance Coon, MD ED       TOTAL TIME TAKING CARE OF THIS PATIENT: 50 minutes.    Saundra Shelling M.D on 11/04/2017 at 8:53 PM  Between 7am to 6pm - Pager - [phone]  After 6pm go to [email or web address] - password [email or web address] [facility]  Office  [phone]  CC: Primary care physician; Dion Body, MD

## 2017-11-04 NOTE — ED Provider Notes (Signed)
[facility] Emergency Department Provider Note  ____________________________________________   First MD Initiated Contact with Patient 11/04/17 [phone]     (approximate)  I have reviewed the triage vital signs and the nursing notes.   HISTORY  Chief Complaint No chief complaint on file.  Level 5 caveat:  history/ROS limited by acute/critical illness  HPI Kellie Harris is a 84 y.o. female with medical history as listed below who presents by EMS after a near syncopal episode but who is acutely altered and unable to provide any history.  Reportedly she is quite functional for her age at baseline, a former emergency department nurse, and she drove herself to the store today.  While at the store she had a near syncopal episode and is been altered since that time.  She is not speaking even though she is awake and appears to be alert, she does not follow commands, and apparently is quite far from her baseline.  She is moving all 4 extremities but no other history or review of systems is available.  Past Medical History:  Diagnosis Date  . Arthritis    right knee, left shoulder/ OSTEO  . Cancer ([place])    skin   . Degenerative disc disease, lumbar    L4-L5  . Diverticulosis    ITIS  . History of hiatal hernia   . Hydronephrosis    right  . Hypercholesteremia   . Hypertension    CONTROLLED ON MEDS  . IBS (irritable bowel syndrome)   . Knee fracture, right 03/05/2015   DIFFICULTY WITH AMBULATION  . Osteopenia   . Ovarian cyst   . Pancreatic insufficiency   . Wears hearing aid    bilateral    Patient Active Problem List   Diagnosis Date Noted  . TIA (transient ischemic attack) 11/04/2017  . Malnutrition of moderate degree 08/26/2017  . Total knee replacement status 08/07/2017  . Closed left hip fracture ([place]) 02/12/2017  . HTN (hypertension) 02/12/2017  . HLD (hyperlipidemia) 02/12/2017    Past Surgical History:  Procedure Laterality Date  . CATARACT  EXTRACTION W/PHACO Right 11/02/2015   Procedure: CATARACT EXTRACTION PHACO AND INTRAOCULAR LENS PLACEMENT (IOC);  Surgeon: Leandrew Koyanagi, MD;  Location: [place];  Service: Ophthalmology;  Laterality: Right;  . CATARACT EXTRACTION W/PHACO Left 07/18/2016   Procedure: CATARACT EXTRACTION PHACO AND INTRAOCULAR LENS PLACEMENT (IOC);  Surgeon: Leandrew Koyanagi, MD;  Location: [place];  Service: Ophthalmology;  Laterality: Left;  . COLONOSCOPY    . FRACTURE SURGERY Left    hip  . INTRAMEDULLARY (IM) NAIL INTERTROCHANTERIC Left 02/12/2017   Procedure: INTRAMEDULLARY (IM) NAIL INTERTROCHANTRIC;  Surgeon: Earnestine Leys, MD;  Location: [facility];  Service: Orthopedics;  Laterality: Left;  . kidney stent    . OOPHORECTOMY Bilateral 2015  . TONSILLECTOMY    . TOTAL KNEE ARTHROPLASTY Right 08/07/2017   Procedure: TOTAL KNEE ARTHROPLASTY;  Surgeon: Earnestine Leys, MD;  Location: [facility];  Service: Orthopedics;  Laterality: Right;    Prior to Admission medications   Medication Sig Start Date End Date Taking? Authorizing Provider  celecoxib (CELEBREX) 200 MG capsule Take 1 capsule (200 mg total) by mouth every 12 (twelve) hours. 08/09/17  Yes Earnestine Leys, MD  gabapentin (NEURONTIN) 100 MG capsule Take 1 capsule (100 mg total) by mouth 3 (three) times daily. Patient taking differently: Take 400 mg by mouth 3 (three) times daily.  08/09/17  Yes Earnestine Leys, MD  lisinopril (PRINIVIL,ZESTRIL) 20 MG tablet Take 20 mg  by mouth 2 (two) times daily.   Yes [provider]  methocarbamol (ROBAXIN) 500 MG tablet Take 1 tablet (500 mg total) by mouth 3 (three) times daily. Patient taking differently: Take 500 mg by mouth 2 (two) times daily.  08/09/17  Yes Earnestine Leys, MD  pravastatin (PRAVACHOL) 20 MG tablet Take 1 tablet (20 mg total) by mouth at bedtime. 08/09/17  Yes Earnestine Leys, MD  amLODipine (NORVASC) 5 MG tablet Take 5 mg by mouth daily.    [provider]  aspirin EC 325 MG tablet Take 1 tablet (325 mg total) by mouth 2 (two) times daily. Take for 4 weeks Patient not taking: Reported on 09/16/2017 08/09/17   Earnestine Leys, MD  aspirin EC 81 MG tablet Take 81 mg by mouth daily.    [provider]  HYDROcodone-acetaminophen (NORCO) 5-325 MG tablet Take 1 tablet by mouth every 6 (six) hours as needed for moderate pain or severe pain. Patient not taking: Reported on 09/16/2017 08/27/17   Hillary Bow, MD    Allergies Patient has no known allergies.  Family History  Problem Relation Age of Onset  . Breast cancer Mother 51  . Leukemia Mother   . Stroke Father     Social History Social History   Tobacco Use  . Smoking status: Never Smoker  . Smokeless tobacco: Never Used  Substance Use Topics  . Alcohol use: No  . Drug use: No    Review of Systems Level 5 caveat:  history/ROS limited by acute/critical illness ____________________________________________   PHYSICAL EXAM:  ED Triage Vitals  Enc Vitals Group     BP 11/04/17 1837 (!) 160/90     Pulse Rate 11/04/17 1837 99     Resp 11/04/17 1837 18     Temp --      Temp src --      SpO2 11/04/17 1837 100 %     Weight 11/04/17 1630 43.1 kg (95 lb)     Height 11/04/17 1630 1.499 m (4\' 11" )     Head Circumference --      Peak Flow --      Pain Score 11/04/17 1931 0     Pain Loc --      Pain Edu? --      Excl. in [place]? --      Constitutional: The patient is awake and alert but unable to follow commands or answer questions although she is also not in any distress, appears slightly agitated Eyes: Conjunctivae are normal. PERRL. EOMI. Head: Atraumatic. Nose: No congestion/rhinnorhea. Mouth/Throat: Mucous membranes are moist. Neck: No stridor.  No meningeal signs.   Cardiovascular: Borderline tachycardia, regular rhythm. Good peripheral circulation. Grossly normal heart sounds. Respiratory: Normal respiratory effort.  No retractions. Lungs  CTAB. Gastrointestinal: Soft and nontender. No distention.  Musculoskeletal: No lower extremity tenderness nor edema. No gross deformities of extremities. Skin:  Skin is warm, [place] and intact. No rash noted. Neurologic: The patient will say "yes" to certain questions and is moving all 4 extremities but does not follow any commands and appears acutely altered  NIH Stroke Scale  Interval: Baseline Time: 16:20 Person Administering Scale: Hinda Kehr  Administer stroke scale items in the order listed. Record performance in each category after each subscale exam. Do not go back and change scores. Follow directions provided for each exam technique. Scores should reflect what the patient does, not what the clinician thinks the patient can do. The clinician should record answers while administering the  exam and work quickly. Except where indicated, the patient should not be coached (i.e., repeated requests to patient to make a special effort).   1a  Level of consciousness: 2=not alert, requires repeated stimulation to attend, or is obtunded and requires strong or painful stimulation to make movements (not stereotyped)  1b. LOC questions:  2=Performs neither task correctly  1c. LOC commands: 2=Performs neither task correctly  2.  Best Gaze: 0=normal  3.  Visual: 0=No visual loss  4. Facial Palsy: 0=Normal symmetric movement  5a.  Motor left arm: 0=moving extremities, but cannot follow commands  5b.  Motor right arm: 0=moving extremities, but cannot follow commands  6a. motor left leg: 0=moving extremities, but cannot follow commands  6b  Motor right leg:  0=moving extremities, but cannot follow commands  7. Limb Ataxia: 2=Present in two limbs  8.  Sensory: 0=Normal; no sensory loss  9. Best Language:  3=Mute, global aphasia; no usable speech or auditory comprehension  10. Dysarthria: 2=Severe; patient speech is so slurred as to be unintelligible in the absence of or our of proportion to any  dysphagia, or is mute/anarthric  11. Extinction and Inattention: 0=No abnormality  12. Distal motor function: 0=Normal   Total:   13     ____________________________________________   LABS (all labs ordered are listed, but only abnormal results are displayed)  Labs Reviewed  GLUCOSE, CAPILLARY - Abnormal; Notable for the following components:      Result Value   Glucose-Capillary 112 (*)    All other components within normal limits  URINE DRUG SCREEN, QUALITATIVE ([facility] ONLY) - Abnormal; Notable for the following components:   Benzodiazepine, Ur Scrn POSITIVE (*)    All other components within normal limits  COMPREHENSIVE METABOLIC PANEL - Abnormal; Notable for the following components:   Potassium 3.3 (*)    CO2 20 (*)    Glucose, Bld 137 (*)    ALT 12 (*)    All other components within normal limits  CBC WITH DIFFERENTIAL/PLATELET - Abnormal; Notable for the following components:   WBC 12.0 (*)    RDW 15.0 (*)    Neutro Abs 9.9 (*)    All other components within normal limits  URINALYSIS, ROUTINE W REFLEX MICROSCOPIC - Abnormal; Notable for the following components:   Color, Urine STRAW (*)    APPearance CLEAR (*)    Hgb urine dipstick MODERATE (*)    Leukocytes, UA TRACE (*)    All other components within normal limits  APTT  PROTIME-INR  TROPONIN I  LIPID PANEL   ____________________________________________  EKG  ED ECG REPORT I, Hinda Kehr, the attending physician, personally viewed and interpreted this ECG.  Date: 11/04/2017 EKG Time: 16: 18 Rate: 100 Rhythm: Borderline sinus tachycardia QRS Axis: normal Intervals: Right bundle branch block ST/T Wave abnormalities: Non-specific ST segment / T-wave changes, but no evidence of acute ischemia. Narrative Interpretation: no evidence of acute ischemia and no significant change from prior EKG.   ____________________________________________  RADIOLOGY Ursula Alert, personally discussed these images and  results by phone with the on-call radiologist and used this discussion as part of my medical decision making.    ED MD interpretation:  No indication of acute bleeding or obvious CVA on non-con head CT.  CT perfusion studies were reassuring.  Official radiology report(s): Ct Angio Head W Or Wo Contrast  Result Date: 11/04/2017 CLINICAL DATA:  Initial evaluation for acute altered mental status, syncopal episode. EXAM: CT ANGIOGRAPHY HEAD AND NECK CT PERFUSION  BRAIN TECHNIQUE: Multidetector CT imaging of the head and neck was performed using the standard protocol during bolus administration of intravenous contrast. Multiplanar CT image reconstructions and MIPs were obtained to evaluate the vascular anatomy. Carotid stenosis measurements (when applicable) are obtained utilizing NASCET criteria, using the distal internal carotid diameter as the denominator. Multiphase CT imaging of the brain was performed following IV bolus contrast injection. Subsequent parametric perfusion maps were calculated using RAPID software. CONTRAST:  174mL ISOVUE-370 IOPAMIDOL (ISOVUE-370) INJECTION 76% COMPARISON:  Prior CT from earlier same day. FINDINGS: CTA NECK FINDINGS Aortic arch: Visualized aortic arch of normal caliber with normal 3 vessel morphology. No flow-limiting stenosis about the origin of the great vessels. Scattered atheromatous plaque noted within the arch itself. Visualized subclavian arteries widely patent. Right carotid system: Right common carotid artery tortuous proximally but widely patent to the bifurcation. Minimal plaque about the right carotid bifurcation without stenosis. Right ICA widely patent from the bifurcation to the skull base without stenosis, dissection, or occlusion. Left carotid system: Left common carotid artery tortuous proximally but widely patent to the bifurcation without stenosis. Scattered calcified plaque about the left bifurcation without significant atheromatous narrowing. Left ICA  patent from the bifurcation to the skull base without stenosis, dissection or occlusion. Vertebral arteries: Both of the vertebral arteries arise from the subclavian arteries. Right vertebral artery dominant. Vertebral arteries widely patent within the neck without stenosis, dissection, or occlusion. Skeleton: No acute osseus abnormality. No worrisome lytic or blastic osseous lesions. Moderate degenerative spondylolysis present at C3-4 through C6-7. Other neck: No acute soft tissue abnormality within the neck. Salivary glands within normal limits. No adenopathy. Chronic left maxillary sinusitis noted. Thyroid normal. Upper chest: Visualized lungs are grossly clear. Review of the MIP images confirms the above findings CTA HEAD FINDINGS Anterior circulation: Petrous segments widely patent bilaterally. Scattered atheromatous plaque within the cavernous/supraclinoid ICAs without significant stenosis. ICA termini widely patent. A1 segments patent bilaterally. Normal anterior communicating artery. Anterior cerebral arteries widely patent to their distal aspects without stenosis. Left M1 segment widely patent without stenosis. No proximal M2 occlusion. Distal left MCA branches well perfused. Right M1 segment patent without stenosis. Approximate 3 mm focal outpouching arising from the mid right M1 segment suspicious for small aneurysm (series 8, image 98). Normal right MCA bifurcation. No proximal right M2 occlusion. Distal right MCA branches well perfused. Posterior circulation: Right vertebral artery dominant. Focal mild stenosis within the left V4 segment as it crosses into the cranial vault (series 8, image 143). Additional atheromatous plaque within the bilateral V4 segments with mild to moderate stenoses. Posterior inferior cerebral arteries patent bilaterally. Basilar artery widely patent to its distal aspect. Superior cerebral arteries patent bilaterally. Both of the posterior cerebral arteries primarily supplied  via the basilar and are widely patent to their distal aspects without stenosis. Venous sinuses: Patent. Anatomic variants: None significant. Delayed phase: Not performed. Review of the MIP images confirms the above findings CT Brain Perfusion Findings: CBF (<30%) Volume: 6mL Perfusion (Tmax>6.0s) volume: 52mL Mismatch Volume: 31mL Infarction Location:No acute stroke by CT perfusion. IMPRESSION: 1. Negative CTA for emergent large vessel occlusion. No evidence for acute stroke by CT perfusion. 2. Mild atheromatous change involving the major arterial vasculature of the head and neck as above, most notable within the bilateral V4 segments were there are mild to moderate stenoses. No other hemodynamically significant or correctable stenosis identified. 3. 3 mm focal outpouching arising from the right M1 segment, suspicious for aneurysm. Electronically Signed   By:  Jeannine Boga M.D.   On: 11/04/2017 19:55   Ct Angio Neck W Or Wo Contrast  Result Date: 11/04/2017 CLINICAL DATA:  Initial evaluation for acute altered mental status, syncopal episode. EXAM: CT ANGIOGRAPHY HEAD AND NECK CT PERFUSION BRAIN TECHNIQUE: Multidetector CT imaging of the head and neck was performed using the standard protocol during bolus administration of intravenous contrast. Multiplanar CT image reconstructions and MIPs were obtained to evaluate the vascular anatomy. Carotid stenosis measurements (when applicable) are obtained utilizing NASCET criteria, using the distal internal carotid diameter as the denominator. Multiphase CT imaging of the brain was performed following IV bolus contrast injection. Subsequent parametric perfusion maps were calculated using RAPID software. CONTRAST:  185mL ISOVUE-370 IOPAMIDOL (ISOVUE-370) INJECTION 76% COMPARISON:  Prior CT from earlier same day. FINDINGS: CTA NECK FINDINGS Aortic arch: Visualized aortic arch of normal caliber with normal 3 vessel morphology. No flow-limiting stenosis about the origin  of the great vessels. Scattered atheromatous plaque noted within the arch itself. Visualized subclavian arteries widely patent. Right carotid system: Right common carotid artery tortuous proximally but widely patent to the bifurcation. Minimal plaque about the right carotid bifurcation without stenosis. Right ICA widely patent from the bifurcation to the skull base without stenosis, dissection, or occlusion. Left carotid system: Left common carotid artery tortuous proximally but widely patent to the bifurcation without stenosis. Scattered calcified plaque about the left bifurcation without significant atheromatous narrowing. Left ICA patent from the bifurcation to the skull base without stenosis, dissection or occlusion. Vertebral arteries: Both of the vertebral arteries arise from the subclavian arteries. Right vertebral artery dominant. Vertebral arteries widely patent within the neck without stenosis, dissection, or occlusion. Skeleton: No acute osseus abnormality. No worrisome lytic or blastic osseous lesions. Moderate degenerative spondylolysis present at C3-4 through C6-7. Other neck: No acute soft tissue abnormality within the neck. Salivary glands within normal limits. No adenopathy. Chronic left maxillary sinusitis noted. Thyroid normal. Upper chest: Visualized lungs are grossly clear. Review of the MIP images confirms the above findings CTA HEAD FINDINGS Anterior circulation: Petrous segments widely patent bilaterally. Scattered atheromatous plaque within the cavernous/supraclinoid ICAs without significant stenosis. ICA termini widely patent. A1 segments patent bilaterally. Normal anterior communicating artery. Anterior cerebral arteries widely patent to their distal aspects without stenosis. Left M1 segment widely patent without stenosis. No proximal M2 occlusion. Distal left MCA branches well perfused. Right M1 segment patent without stenosis. Approximate 3 mm focal outpouching arising from the mid right  M1 segment suspicious for small aneurysm (series 8, image 98). Normal right MCA bifurcation. No proximal right M2 occlusion. Distal right MCA branches well perfused. Posterior circulation: Right vertebral artery dominant. Focal mild stenosis within the left V4 segment as it crosses into the cranial vault (series 8, image 143). Additional atheromatous plaque within the bilateral V4 segments with mild to moderate stenoses. Posterior inferior cerebral arteries patent bilaterally. Basilar artery widely patent to its distal aspect. Superior cerebral arteries patent bilaterally. Both of the posterior cerebral arteries primarily supplied via the basilar and are widely patent to their distal aspects without stenosis. Venous sinuses: Patent. Anatomic variants: None significant. Delayed phase: Not performed. Review of the MIP images confirms the above findings CT Brain Perfusion Findings: CBF (<30%) Volume: 11mL Perfusion (Tmax>6.0s) volume: 3mL Mismatch Volume: 74mL Infarction Location:No acute stroke by CT perfusion. IMPRESSION: 1. Negative CTA for emergent large vessel occlusion. No evidence for acute stroke by CT perfusion. 2. Mild atheromatous change involving the major arterial vasculature of the head and neck as  above, most notable within the bilateral V4 segments were there are mild to moderate stenoses. No other hemodynamically significant or correctable stenosis identified. 3. 3 mm focal outpouching arising from the right M1 segment, suspicious for aneurysm. Electronically Signed   By: Jeannine Boga M.D.   On: 11/04/2017 19:55   Ct Cerebral Perfusion W Contrast  Result Date: 11/04/2017 CLINICAL DATA:  Initial evaluation for acute altered mental status, syncopal episode. EXAM: CT ANGIOGRAPHY HEAD AND NECK CT PERFUSION BRAIN TECHNIQUE: Multidetector CT imaging of the head and neck was performed using the standard protocol during bolus administration of intravenous contrast. Multiplanar CT image  reconstructions and MIPs were obtained to evaluate the vascular anatomy. Carotid stenosis measurements (when applicable) are obtained utilizing NASCET criteria, using the distal internal carotid diameter as the denominator. Multiphase CT imaging of the brain was performed following IV bolus contrast injection. Subsequent parametric perfusion maps were calculated using RAPID software. CONTRAST:  142mL ISOVUE-370 IOPAMIDOL (ISOVUE-370) INJECTION 76% COMPARISON:  Prior CT from earlier same day. FINDINGS: CTA NECK FINDINGS Aortic arch: Visualized aortic arch of normal caliber with normal 3 vessel morphology. No flow-limiting stenosis about the origin of the great vessels. Scattered atheromatous plaque noted within the arch itself. Visualized subclavian arteries widely patent. Right carotid system: Right common carotid artery tortuous proximally but widely patent to the bifurcation. Minimal plaque about the right carotid bifurcation without stenosis. Right ICA widely patent from the bifurcation to the skull base without stenosis, dissection, or occlusion. Left carotid system: Left common carotid artery tortuous proximally but widely patent to the bifurcation without stenosis. Scattered calcified plaque about the left bifurcation without significant atheromatous narrowing. Left ICA patent from the bifurcation to the skull base without stenosis, dissection or occlusion. Vertebral arteries: Both of the vertebral arteries arise from the subclavian arteries. Right vertebral artery dominant. Vertebral arteries widely patent within the neck without stenosis, dissection, or occlusion. Skeleton: No acute osseus abnormality. No worrisome lytic or blastic osseous lesions. Moderate degenerative spondylolysis present at C3-4 through C6-7. Other neck: No acute soft tissue abnormality within the neck. Salivary glands within normal limits. No adenopathy. Chronic left maxillary sinusitis noted. Thyroid normal. Upper chest: Visualized  lungs are grossly clear. Review of the MIP images confirms the above findings CTA HEAD FINDINGS Anterior circulation: Petrous segments widely patent bilaterally. Scattered atheromatous plaque within the cavernous/supraclinoid ICAs without significant stenosis. ICA termini widely patent. A1 segments patent bilaterally. Normal anterior communicating artery. Anterior cerebral arteries widely patent to their distal aspects without stenosis. Left M1 segment widely patent without stenosis. No proximal M2 occlusion. Distal left MCA branches well perfused. Right M1 segment patent without stenosis. Approximate 3 mm focal outpouching arising from the mid right M1 segment suspicious for small aneurysm (series 8, image 98). Normal right MCA bifurcation. No proximal right M2 occlusion. Distal right MCA branches well perfused. Posterior circulation: Right vertebral artery dominant. Focal mild stenosis within the left V4 segment as it crosses into the cranial vault (series 8, image 143). Additional atheromatous plaque within the bilateral V4 segments with mild to moderate stenoses. Posterior inferior cerebral arteries patent bilaterally. Basilar artery widely patent to its distal aspect. Superior cerebral arteries patent bilaterally. Both of the posterior cerebral arteries primarily supplied via the basilar and are widely patent to their distal aspects without stenosis. Venous sinuses: Patent. Anatomic variants: None significant. Delayed phase: Not performed. Review of the MIP images confirms the above findings CT Brain Perfusion Findings: CBF (<30%) Volume: 62mL Perfusion (Tmax>6.0s) volume: 19mL Mismatch Volume:  81mL Infarction Location:No acute stroke by CT perfusion. IMPRESSION: 1. Negative CTA for emergent large vessel occlusion. No evidence for acute stroke by CT perfusion. 2. Mild atheromatous change involving the major arterial vasculature of the head and neck as above, most notable within the bilateral V4 segments were there  are mild to moderate stenoses. No other hemodynamically significant or correctable stenosis identified. 3. 3 mm focal outpouching arising from the right M1 segment, suspicious for aneurysm. Electronically Signed   By: Jeannine Boga M.D.   On: 11/04/2017 19:55   Ct Head Code Stroke Wo Contrast  Result Date: 11/04/2017 CLINICAL DATA:  Code stroke. Aphasia and confusion developed over the last hour. EXAM: CT HEAD WITHOUT CONTRAST TECHNIQUE: Contiguous axial images were obtained from the base of the skull through the vertex without intravenous contrast. COMPARISON:  MRI 02/11/2017 FINDINGS: Brain: Generalized brain atrophy. Chronic small-vessel ischemic changes throughout the cerebral hemispheric white matter. Old lacunar infarctions in the right thalamus and right frontal white matter. No sign of acute infarction, mass lesion, hemorrhage, hydrocephalus or extra-axial collection. Vascular: There is atherosclerotic calcification of the major vessels at the base of the brain. Skull: Negative Sinuses/Orbits: Left maxillary sinus inflammatory changes. Other sinuses are clear. Orbits negative. Other: None ASPECTS ([place] Stroke Program Early CT Score) - Ganglionic level infarction (caudate, lentiform nuclei, internal capsule, insula, M1-M3 cortex): 7 - Supraganglionic infarction (M4-M6 cortex): 3 Total score (0-10 with 10 being normal): 10 IMPRESSION: 1. No acute finding by CT. Atrophy and extensive chronic ischemic changes seen throughout as above. 2. ASPECTS is 10. 3. These results were communicated to Imani Fiebelkorn at [place] 11/04/2017 via telephone. Electronically Signed   By: Nelson Chimes M.D.   On: 11/04/2017 16:47    ____________________________________________   PROCEDURES  Critical Care performed: Yes, see critical care procedure note(s)   Procedure(s) performed:   .Sedation Date/Time: 11/04/2017 6:07 PM Performed by: Hinda Kehr, MD Authorized by: Hinda Kehr, MD   Consent:    Consent  obtained:  Verbal (electronic informed consent)   Consent given by:  Healthcare agent and guardian   Risks discussed:  Inadequate sedation, nausea, vomiting, respiratory compromise necessitating ventilatory assistance and intubation, prolonged sedation necessitating reversal, prolonged hypoxia resulting in organ damage, allergic reaction and dysrhythmia Universal protocol:    Procedure explained and questions answered to patient or proxy's satisfaction: yes     Relevant documents present and verified: yes     Test results available and properly labeled: yes     Imaging studies available: yes     Required blood products, implants, devices, and special equipment available: yes     Immediately prior to procedure a time out was called: yes     Patient identity confirmation method:  Arm band Indications:    Intended level of sedation:  Moderate (conscious sedation) Pre-sedation assessment:    Time since last food or drink:  Unknown   NPO status caution: unable to specify NPO status and urgency dictates proceeding with non-ideal NPO status     ASA classification: class 3 - patient with severe systemic disease     Neck mobility: normal     Mallampati score:  Unable to assess (patient will not follow commands)   Pre-sedation assessments completed and reviewed: airway patency, cardiovascular function, mental status, nausea/vomiting, respiratory function and temperature     Pre-sedation assessment completed:  11/04/2017 6:08 PM Immediate pre-procedure details:    Reassessment: Patient reassessed immediately prior to procedure     Reviewed: vital signs,  relevant labs/tests and NPO status     Verified: bag valve mask available, emergency equipment available, intubation equipment available, IV patency confirmed, oxygen available, reversal medications available and suction available   Procedure details (see MAR for exact dosages):    Preoxygenation:  Nasal cannula   Sedation:  Ketamine   Intra-procedure  monitoring:  Blood pressure monitoring, continuous pulse oximetry, cardiac monitor, frequent vital sign checks and frequent LOC assessments   Intra-procedure events: none     Total Provider sedation time (minutes):  10 Post-procedure details:    Post-sedation assessment completed:  11/04/2017 7:34 PM   Attendance: Constant attendance by certified staff until patient recovered     Recovery: Patient returned to pre-procedure baseline     Post-sedation assessments completed and reviewed: airway patency, cardiovascular function, hydration status, mental status and respiratory function     Patient is stable for discharge or admission: yes     Patient tolerance:  Tolerated well, no immediate complications .Critical Care Performed by: Hinda Kehr, MD Authorized by: Hinda Kehr, MD   Critical care provider statement:    Critical care time (minutes):  60   Critical care time was exclusive of:  Separately billable procedures and treating other patients   Critical care was necessary to treat or prevent imminent or life-threatening deterioration of the following conditions:  CNS failure or compromise   Critical care was time spent personally by me on the following activities:  Development of treatment plan with patient or surrogate, discussions with consultants, evaluation of patient's response to treatment, examination of patient, obtaining history from patient or surrogate, ordering and performing treatments and interventions, ordering and review of laboratory studies, ordering and review of radiographic studies, pulse oximetry, re-evaluation of patient's condition and review of old charts     ____________________________________________   INITIAL IMPRESSION / [place] / ED COURSE  As part of my medical decision making, I reviewed the following data within the [facility] History obtained from family, Nursing notes reviewed and incorporated, Labs reviewed , EKG  interpreted , Old chart reviewed, Discussed with admitting physician , A consult was requested and obtained from this/these consultant(s) Neurology and Notes from prior ED visits     Clinical Course as of Nov 06 102  Mon Nov 04, 2017  1619 The patient's nurse, Rush Landmark, alert and need to the fact that the patient seems to be off her baseline and there is concern for acute stroke.  The patient is able to say "yes" when I asked her questions but is otherwise noncommunicative and obviously distressed about something.  She is not following commands.  She has no obvious facial droop and she seems to be moving all 4 extremities but for a patient who reportedly drove herself to the store and then had acute onset of symptoms, I believe acute CVA is most likely.  Differential diagnosis also includes sepsis, ACS, or all other acute or emergent and potentially life-threatening causes of illness, but right now we are checking a fingerstick blood sugar and an EKG.  Assuming that the finger stick blood sugar and EKG are reassuring, I will make her a code stroke immediately.  Family has also arrived and should be able to provide a little bit of additional information.   [CF]  6301 As documented above, EKG is essentially unchanged from prior, awaiting fingerstick blood glucose.   [CF]  [phone] Spoke with phone with radiology who said that her noncontrast head CT seems to be at baseline  with no evidence on this particular study of an acute infarction.  Patient is still significantly altered and unable to participate in exam.  We will proceed with the code stroke evaluation including tele-neurology assessment.   [CF]  4970 Patient is tracking a little bit better than she was previously but is still nonverbal other than saying yes occasionally.  She seems to be trying to pay attention and follow our discussion with the stroke nurse, but she is clearly unable to do so consistently.  Neurologist is getting ready to come over the  computer to evaluate the patient.   [CF]  2637 I spoke by phone with the neurologist who evaluated the patient.  He is very concerned about her persistent confusion and aphasia although she is following commands a little bit better than she was previously.  He recommended continue with perfusion studies and I have placed the order for CT Angio head and neck as well as CT perfusion study according to protocol.  He definitely recommended admission after we get the results of these studies unless she has something that looks intervene a bowl and would require transfer to another facility.  He recommended against TPA at this time given that we do not know exactly what is going on and we do not know a definitive time of onset as well as given her age and her waxing and waning but mostly improving symptoms.  I agree with this plan as well.   [CF]  8588 I was informed by Rush Landmark (her nurse) that they are having trouble getting the perfusion study because she is too confused and will not follow commands to hold still.  Ordinarily I would not provide benzodiazepines to elderly patients, but in an attempt to rapidly get the perfusion study she needs, I authorized Versed 0.5 mg IV with another 0.5 mg if needed for adequate sedation to get the studies.  I shows this due to its rapid onset and shorter half-life than a medication such as Ativan.   [CF]  1857 Documentation was delayed due to multiple critical patients in the emergency department.  In short, the patient was not adequately sedated even after a total of Versed 1.5 mg IV.  She is improving clinically and able to speak now but she is still very confused, picking at things including her IV, and not able to follow commands.  Her daughter is now at bedside and states that this is far from usual for her.  I consulted briefly with the daughter as well as with the neurologist again over the stroke cart, and we agreed that the patient remains not a good candidate for TPA  given the reasons described above, but we do need the perfusion studies.  The neurologist and I both agree that the patient does not require intubation and intubating her for the imaging studies would put her at unnecessary risk.  However, I proceeded with ketamine sedation to allow for the imaging.  She tolerated the sedation well and is currently in the process of waking back up.  Imaging is pending.  Daughter gave verbal and written consent.   [CF]  1950 I called radiology and reportedly the radiologist is evaluating the images right now.   [CF]  2010 CT perfusion studies are unremarkable.  I reassessed the patient and she is easily now except that she still is having word finding difficulties that are not normal for her.  She and her daughter agree with the plan for admission.  Urinalysis is  still pending.  I discussed with Dr. Estanislado Pandy with the hospitalist service the specifics of the case and stressed to him that this really seems like it was CNS/CVA/TIA, even if the urinalysis comes back positive.  He understands and agrees with the plan.  Lab work is generally unremarkable with a very slight elevation of white blood cell count and a very slightly low potassium.  She passed her nurse swallow study just now.   [CF]    Clinical Course User Index [CF] Hinda Kehr, MD    ____________________________________________  FINAL CLINICAL IMPRESSION(S) / ED DIAGNOSES  Final diagnoses:  TIA (transient ischemic attack)  Aphasia     MEDICATIONS GIVEN DURING THIS VISIT:  Medications  sodium chloride 0.9 % bolus 500 mL (500 mLs Intravenous Given 11/04/17 2005)    Followed by  0.9 %  sodium chloride infusion (100 mL/hr Intravenous Transfusing/Transfer 11/04/17 2152)  midazolam (VERSED) 5 MG/5ML injection (  Not Given 11/04/17 1909)  amLODipine (NORVASC) tablet 5 mg (has no administration in time range)  gabapentin (NEURONTIN) capsule 400 mg (400 mg Oral Given 11/04/17 2324)  lisinopril  (PRINIVIL,ZESTRIL) tablet 20 mg (20 mg Oral Given 11/04/17 2323)  pravastatin (PRAVACHOL) tablet 20 mg (20 mg Oral Given 11/04/17 2324)  acetaminophen (TYLENOL) tablet 650 mg (has no administration in time range)    Or  acetaminophen (TYLENOL) solution 650 mg (has no administration in time range)    Or  acetaminophen (TYLENOL) suppository 650 mg (has no administration in time range)  enoxaparin (LOVENOX) injection 40 mg (40 mg Subcutaneous Given 11/04/17 2324)  aspirin suppository 300 mg (has no administration in time range)    Or  aspirin tablet 325 mg (has no administration in time range)  iopamidol (ISOVUE-370) 76 % injection 100 mL (100 mLs Intravenous Contrast Given 11/04/17 1728)  midazolam (VERSED) 5 MG/5ML injection 0.5 mg (0.5 mg Intravenous Given 11/04/17 1740)  midazolam (VERSED) 5 MG/5ML injection 0.5 mg (0.5 mg Intravenous Given 11/04/17 1735)  midazolam (VERSED) 5 MG/5ML injection 0.5 mg (0.5 mg Intravenous Given 11/04/17 1745)  ketamine (KETALAR) injection 43 mg (43 mg Intravenous Given by Other 11/04/17 1838)   stroke: mapping our early stages of recovery book ( Does not apply Given 11/04/17 2323)     ED Discharge Orders    None       Note:  This document was prepared using Dragon voice recognition software and may include unintentional dictation errors.    Hinda Kehr, MD 11/05/17 [phone]

## 2017-11-05 ENCOUNTER — Observation Stay: Payer: PPO

## 2017-11-05 ENCOUNTER — Observation Stay
Admit: 2017-11-05 | Discharge: 2017-11-05 | Disposition: A | Discharge status: Home / Self Care | Payer: PPO | Attending: Internal Medicine | Admitting: Internal Medicine

## 2017-11-05 DIAGNOSIS — I63233 Cerebral infarction due to unspecified occlusion or stenosis of bilateral carotid arteries: Secondary | ICD-10-CM | POA: Diagnosis not present

## 2017-11-05 DIAGNOSIS — G459 Transient cerebral ischemic attack, unspecified: Secondary | ICD-10-CM | POA: Diagnosis not present

## 2017-11-05 DIAGNOSIS — I10 Essential (primary) hypertension: Secondary | ICD-10-CM | POA: Diagnosis not present

## 2017-11-05 DIAGNOSIS — R55 Syncope and collapse: Secondary | ICD-10-CM | POA: Diagnosis not present

## 2017-11-05 DIAGNOSIS — M1990 Unspecified osteoarthritis, unspecified site: Secondary | ICD-10-CM | POA: Diagnosis not present

## 2017-11-05 DIAGNOSIS — I639 Cerebral infarction, unspecified: Secondary | ICD-10-CM | POA: Diagnosis not present

## 2017-11-05 LAB — LIPID PANEL
CHOL/HDL RATIO: 3.2 ratio
CHOLESTEROL: 147 mg/dL (ref 0–200)
HDL: 46 mg/dL (ref >40)
LDL Cholesterol: 79 mg/dL (ref 0–99)
Triglycerides: 109 mg/dL (ref <150)
VLDL: 22 mg/dL (ref 0–40)

## 2017-11-05 LAB — ECHOCARDIOGRAM COMPLETE
HEIGHTINCHES: 59 in
WEIGHTICAEL: 1576 [oz_av]

## 2017-11-05 MED ORDER — CLOPIDOGREL BISULFATE 75 MG PO TABS
75.0000 mg | ORAL_TABLET | Freq: Every day | ORAL | Status: DC
  Administered 2017-11-05: 75 mg via ORAL
  Filled 2017-11-05: qty 1

## 2017-11-05 MED ORDER — CLOPIDOGREL BISULFATE 75 MG PO TABS: 75.0000 mg | ORAL_TABLET | Freq: Every day | ORAL | 0 refills | Status: AC

## 2017-11-05 NOTE — Evaluation (Addendum)
Speech Language Pathology Evaluation Patient Details Name: Kellie Harris MRN: [id number] DOB: 25-Dec-1930 Today's Date: 11/05/2017 Time: 3662-9476 SLP Time Calculation (min) (ACUTE ONLY): 60 min  Problem List:  Patient Active Problem List   Diagnosis Date Noted  . Syncope   . TIA (transient ischemic attack) 11/04/2017  . Malnutrition of moderate degree 08/26/2017  . Total knee replacement status 08/07/2017  . Closed left hip fracture ([place]) 02/12/2017  . HTN (hypertension) 02/12/2017  . HLD (hyperlipidemia) 02/12/2017   Past Medical History:  Past Medical History:  Diagnosis Date  . Arthritis    right knee, left shoulder/ OSTEO  . Cancer ([place])    skin   . Degenerative disc disease, lumbar    L4-L5  . Diverticulosis    ITIS  . History of hiatal hernia   . Hydronephrosis    right  . Hypercholesteremia   . Hypertension    CONTROLLED ON MEDS  . IBS (irritable bowel syndrome)   . Knee fracture, right 03/05/2015   DIFFICULTY WITH AMBULATION  . Osteopenia   . Ovarian cyst   . Pancreatic insufficiency   . Wears hearing aid    bilateral   Past Surgical History:  Past Surgical History:  Procedure Laterality Date  . CATARACT EXTRACTION W/PHACO Right 11/02/2015   Procedure: CATARACT EXTRACTION PHACO AND INTRAOCULAR LENS PLACEMENT (IOC);  Surgeon: Leandrew Koyanagi, MD;  Location: [facility];  Service: Ophthalmology;  Laterality: Right;  . CATARACT EXTRACTION W/PHACO Left 07/18/2016   Procedure: CATARACT EXTRACTION PHACO AND INTRAOCULAR LENS PLACEMENT (IOC);  Surgeon: Leandrew Koyanagi, MD;  Location: [facility];  Service: Ophthalmology;  Laterality: Left;  . COLONOSCOPY    . FRACTURE SURGERY Left    hip  . INTRAMEDULLARY (IM) NAIL INTERTROCHANTERIC Left 02/12/2017   Procedure: INTRAMEDULLARY (IM) NAIL INTERTROCHANTRIC;  Surgeon: Earnestine Leys, MD;  Location: [facility];  Service: Orthopedics;  Laterality: Left;  . kidney stent    . OOPHORECTOMY Bilateral 2015   . TONSILLECTOMY    . TOTAL KNEE ARTHROPLASTY Right 08/07/2017   Procedure: TOTAL KNEE ARTHROPLASTY;  Surgeon: Earnestine Leys, MD;  Location: [facility];  Service: Orthopedics;  Laterality: Right;   HPI:  Pt is a 84 y.o. female with a known history of hyperlipidemia, hypertension, degenerative disc disease presented to the emergency room after she was about to pass out.  Patient had a near syncopal episode and she was brought to the emergency room by EMS.  Patient had this episode at the [employer] store.  After she arrived to the emergency room she was noted to be confused not following any commands and she had trouble expressing words patient was worked up with CT head which showed no acute abnormality.  Later on her mental status improved she is back to baseline and with no difficulty in speech.  Patient is hard of hearing and uses hearing aids.  Patient was evaluated by tele neurology who recommended no TPA.  Advised to start patient on oral aspirin, inpatient neurology consultation and check MRI brain and echocardiogram no tingling or numbness in any part of the body. No weakness in any part of the body.  Patient also had CTA neck which showed no acute abnormality. MRI revealed no acute findings, Moderate chronic small vessel ischemic disease. Pt is HOH; wear glasses.    Assessment / Plan / Recommendation Clinical Impression  Pt was seen this morning and administered the [employer] Aphasic Battery bedside screening tool d/t recent admission w/ c/o expressive language deficits. Pt's  medical and cognitive-linguistic status' have improved since admission; neurological testing ongoing to r/o stroke. Pt performed adequately on the screening tool w/ no Receptive or Expressive Aphasia apparent per results. Tasks of Auditory Comprehension: pt answered Y/N question, followed 3 step commands, and completed object function tasks appropriately. Tasks of Expressive Language: pt exhibited appropriate speech content  and fluency, object naming, and verbal repetition tasks appropriately. Pt was Oriented x4; No Apraxia apparent during tasks. Pt completed basic Writing tasks, Reading tasks appropriately. No further skilled ST services indicated at this time as pt appears back at her baseline. Pt will f/u w/ her PCP post discharge. Discussed a strategy to help her cover her questions/thoughts w/ her PCP at visits: write down the bullet points to cover on a piece of paper to present to the MD at the visit. Pt is HOH and often feels she can miss information if people talk too fast and if the environment is noisy/busy. She did best w/ this SLP when sitting in front of pt at her bedside w/ the door of the room shut to block noise; using a min slower rate and looking at the pt during discussion.     SLP Assessment  SLP Recommendation/Assessment: Patient does not need any further Speech Lanaguage Pathology Services SLP Visit Diagnosis: Aphasia (R47.01)(resolved)    Follow Up Recommendations  None    Frequency and Duration (n/a)  (n/a)      SLP Evaluation Cognition  Overall Cognitive Status: Within Functional Limits for tasks assessed Arousal/Alertness: Awake/alert Orientation Level: Oriented X4 Attention: Focused;Sustained Focused Attention: Appears intact Sustained Attention: Appears intact Memory: Appears intact Awareness: Appears intact Problem Solving: Appears intact Executive Function: Reasoning;Organizing Reasoning: Appears intact Organizing: Appears intact Behaviors: (n/a) Safety/Judgment: Appears intact Comments: devised a plan for her dtr to check on her at her house when she is near [place] 2x week       Comprehension  Auditory Comprehension Overall Auditory Comprehension: Appears within functional limits for tasks assessed Yes/No Questions: Within Functional Limits Commands: Within Functional Limits Conversation: Complex Interfering Components: Hearing(min HOH) EffectiveTechniques:  Stressing words;Increased volume(reducing distracting sounds/noise) Visual Recognition/Discrimination Discrimination: Not tested Reading Comprehension Reading Status: Within funtional limits    Expression Expression Primary Mode of Expression: Verbal Verbal Expression Overall Verbal Expression: Appears within functional limits for tasks assessed Initiation: No impairment Automatic Speech: (WFL) Level of Generative/Spontaneous Verbalization: Conversation Repetition: No impairment Naming: No impairment Pragmatics: No impairment Interfering Components: (none) Written Expression Dominant Hand: Right Written Expression: Within Functional Limits   Oral / Motor  Oral Motor/Sensory Function Overall Oral Motor/Sensory Function: Within functional limits Motor Speech Overall Motor Speech: Appears within functional limits for tasks assessed Respiration: Within functional limits Phonation: Normal Resonance: Within functional limits Articulation: Within functional limitis Intelligibility: Intelligible Motor Planning: Witnin functional limits Motor Speech Errors: Not applicable Interfering Components: Hearing loss(min) Effective Techniques: Slow rate(increase volume)   GO                     Orinda Kenner, MS, CCC-SLP Watson,Katherine 11/05/2017, 4:29 PM

## 2017-11-05 NOTE — Evaluation (Deleted)
Clinical/Bedside Swallow Evaluation Patient Details  Name: ELEXIS POLLAK MRN: [id number] Date of Birth: 23-May-1931  Today's Date: 11/05/2017 Time: SLP Start Time (ACUTE ONLY): 1045 SLP Stop Time (ACUTE ONLY): 1145 SLP Time Calculation (min) (ACUTE ONLY): 60 min  Past Medical History:  Past Medical History:  Diagnosis Date  . Arthritis    right knee, left shoulder/ OSTEO  . Cancer ([place])    skin   . Degenerative disc disease, lumbar    L4-L5  . Diverticulosis    ITIS  . History of hiatal hernia   . Hydronephrosis    right  . Hypercholesteremia   . Hypertension    CONTROLLED ON MEDS  . IBS (irritable bowel syndrome)   . Knee fracture, right 03/05/2015   DIFFICULTY WITH AMBULATION  . Osteopenia   . Ovarian cyst   . Pancreatic insufficiency   . Wears hearing aid    bilateral   Past Surgical History:  Past Surgical History:  Procedure Laterality Date  . CATARACT EXTRACTION W/PHACO Right 11/02/2015   Procedure: CATARACT EXTRACTION PHACO AND INTRAOCULAR LENS PLACEMENT (IOC);  Surgeon: Leandrew Koyanagi, MD;  Location: [place];  Service: Ophthalmology;  Laterality: Right;  . CATARACT EXTRACTION W/PHACO Left 07/18/2016   Procedure: CATARACT EXTRACTION PHACO AND INTRAOCULAR LENS PLACEMENT (IOC);  Surgeon: Leandrew Koyanagi, MD;  Location: [place];  Service: Ophthalmology;  Laterality: Left;  . COLONOSCOPY    . FRACTURE SURGERY Left    hip  . INTRAMEDULLARY (IM) NAIL INTERTROCHANTERIC Left 02/12/2017   Procedure: INTRAMEDULLARY (IM) NAIL INTERTROCHANTRIC;  Surgeon: Earnestine Leys, MD;  Location: [facility];  Service: Orthopedics;  Laterality: Left;  . kidney stent    . OOPHORECTOMY Bilateral 2015  . TONSILLECTOMY    . TOTAL KNEE ARTHROPLASTY Right 08/07/2017   Procedure: TOTAL KNEE ARTHROPLASTY;  Surgeon: Earnestine Leys, MD;  Location: [facility];  Service: Orthopedics;  Laterality: Right;   HPI:  Pt is a 84 y.o. female with a known history of hyperlipidemia,  hypertension, degenerative disc disease presented to the emergency room after she was about to pass out.  Patient had a near syncopal episode and she was brought to the emergency room by EMS.  Patient had this episode at the [facility] store.  After she arrived to the emergency room she was noted to be confused not following any commands and she had trouble expressing words patient was worked up with CT head which showed no acute abnormality.  Later on her mental status improved she is back to baseline and with no difficulty in speech.  Patient is hard of hearing and uses hearing aids.  Patient was evaluated by tele neurology who recommended no TPA.  Advised to start patient on oral aspirin, inpatient neurology consultation and check MRI brain and echocardiogram no tingling or numbness in any part of the body. No weakness in any part of the body.  Patient also had CTA neck which showed no acute abnormality. MRI revealed no acute findings, Moderate chronic small vessel ischemic disease. Pt is HOH; wear glasses.    Assessment / Plan / Recommendation Clinical Impression  Pt was seen this morning and administered the Western Aphasic Battery bedside screening tool d/t recent admission w/ c/o expressive language deficits. Pt's medical and cognitive-linguistic status' have improved since admission; neurological testing ongoing to r/o stroke. Pt performed adequately on the screening tool w/ no Receptive or Expressive Aphasia apparent per results. Tasks of Auditory Comprehension: pt answered Y/N question, followed 3 step commands, and  completed object function tasks appropriately. Tasks of Expressive Language: pt exhibited appropriate speech content and fluency, object naming, and verbal repetition tasks appropriately. Pt was Oriented x4; No Apraxia apparent during tasks. No further skilled ST services indicated at this time. Pt will f/u w/ her PCP post discharged. Discussed a strategy to help her cover her thoughts  w/ her PCP at the visit: write down the bullet points to cover on a piece and present at the visit. Pt is HOH and often feels she can miss information if people talk too fast and if the environment is noisy/busy. She did best when this tester was sitting in front of her at bedside w/ the door of the room shut to block noise; min slower rate and looking at the pt. SLP Visit Diagnosis: Aphasia (R47.01)(resolved)    Aspiration Risk       Diet Recommendation          Other  Recommendations     Follow up Recommendations None      Frequency and Duration (n/a)          Prognosis        Swallow Study   General HPI: Pt is a 84 y.o. female with a known history of hyperlipidemia, hypertension, degenerative disc disease presented to the emergency room after she was about to pass out.  Patient had a near syncopal episode and she was brought to the emergency room by EMS.  Patient had this episode at the [facility] store.  After she arrived to the emergency room she was noted to be confused not following any commands and she had trouble expressing words patient was worked up with CT head which showed no acute abnormality.  Later on her mental status improved she is back to baseline and with no difficulty in speech.  Patient is hard of hearing and uses hearing aids.  Patient was evaluated by tele neurology who recommended no TPA.  Advised to start patient on oral aspirin, inpatient neurology consultation and check MRI brain and echocardiogram no tingling or numbness in any part of the body. No weakness in any part of the body.  Patient also had CTA neck which showed no acute abnormality. MRI revealed no acute findings, Moderate chronic small vessel ischemic disease. Pt is HOH; wear glasses.     Oral/Motor/Sensory Function Overall Oral Motor/Sensory Function: Within functional limits   Ice Chips     Thin Liquid      Nectar Thick     Honey Thick     Puree     Solid   GO             Orinda Kenner, MS, CCC-SLP Jalan Fariss 11/05/2017,4:28 PM

## 2017-11-05 NOTE — Discharge Instructions (Signed)
Resume diet and activity as before ° ° °

## 2017-11-05 NOTE — Evaluation (Signed)
Physical Therapy Evaluation Patient Details Name: Kellie Harris MRN: [id number] DOB: 21-Feb-1931 Today's Date: 11/05/2017   History of Present Illness  Pt admitted for TIA. History includes HTN, DDD, and R TKR on 12/26. Pt reports feeling of going to pass out while in [employer]. Of note, L hip fracture approx 6 weeks ago, non operative treatment.  Clinical Impression  Pt is a pleasant 84 year old female who was admitted for TIA. Pt just arrived back from imaging when PT eval initiated. Reports she passed out at [employer] but didn't "fall". Pt performs bed mobility with independence, transfers with mod I, and ambulation with cga and RW. Strength equal bilaterally with no deficits in sensation, finger opposition, or RAMPs. No tone noted.  Pt demonstrates deficits with endurance/balance. Appears to be close to baseline level. Would benefit from skilled PT to address above deficits and promote optimal return to PLOF. Recommend continuing with ongoing OP PT to address deficits.      Follow Up Recommendations Outpatient PT    Equipment Recommendations  None recommended by PT    Recommendations for Other Services       Precautions / Restrictions Precautions Precautions: Fall Restrictions Weight Bearing Restrictions: No      Mobility  Bed Mobility Overal bed mobility: Independent             General bed mobility comments: safe technique and ease of transfer. Once seated at EOB, upright posture noted  Transfers Overall transfer level: Modified independent Equipment used: Rolling walker (2 wheeled)             General transfer comment: upright posture noted with RW. Safe technique  Ambulation/Gait Ambulation/Gait assistance: Min guard Ambulation Distance (Feet): 100 Feet Assistive device: Rolling walker (2 wheeled) Gait Pattern/deviations: WFL(Within Functional Limits)     General Gait Details: ambulated in room x 3-4 laps. Reciprocal gait pattern performed. No  reports of dizziness with ambulation. Safe technique with RW. Did fatigue with increased distance  Stairs            Wheelchair Mobility    Modified Rankin (Stroke Patients Only)       Balance Overall balance assessment: History of Falls;Needs assistance Sitting-balance support: Feet supported Sitting balance-Leahy Scale: Normal     Standing balance support: Bilateral upper extremity supported Standing balance-Leahy Scale: Good                               Pertinent Vitals/Pain Pain Assessment: No/denies pain    Home Living Family/patient expects to be discharged to:: Private residence Living Arrangements: Alone   Type of Home: House Home Access: Stairs to enter Entrance Stairs-Rails: Can reach both Entrance Stairs-Number of Steps: 1 Home Layout: One level Home Equipment: Walker - 2 wheels;Cane - single point      Prior Function Level of Independence: Independent with assistive device(s)         Comments: Pt reports she's been using RW at all times     Hand Dominance        Extremity/Trunk Assessment   Upper Extremity Assessment Upper Extremity Assessment: Overall WFL for tasks assessed    Lower Extremity Assessment Lower Extremity Assessment: Overall WFL for tasks assessed       Communication   Communication: No difficulties  Cognition Arousal/Alertness: Awake/alert Behavior During Therapy: WFL for tasks assessed/performed Overall Cognitive Status: Within Functional Limits for tasks assessed  General Comments      Exercises Other Exercises Other Exercises: Pt ambulated to bathroom with supervision. Safe technique and no impulsive nature noted. Supervision for hygiene.   Assessment/Plan    PT Assessment Patient needs continued PT services  PT Problem List Decreased activity tolerance;Decreased balance;Decreased mobility       PT Treatment Interventions Gait  training;Stair training;Balance training    PT Goals (Current goals can be found in the Care Plan section)  Acute Rehab PT Goals Patient Stated Goal: to stay out of the hospital PT Goal Formulation: With patient Time For Goal Achievement: 11/19/17 Potential to Achieve Goals: Good    Frequency Min 2X/week   Barriers to discharge        Co-evaluation               AM-PAC PT "6 Clicks" Daily Activity  Outcome Measure Difficulty turning over in bed (including adjusting bedclothes, sheets and blankets)?: None Difficulty moving from lying on back to sitting on the side of the bed? : None Difficulty sitting down on and standing up from a chair with arms (e.g., wheelchair, bedside commode, etc,.)?: None Help needed moving to and from a bed to chair (including a wheelchair)?: None Help needed walking in hospital room?: None Help needed climbing 3-5 steps with a railing? : A Little 6 Click Score: 23    End of Session Equipment Utilized During Treatment: Gait belt Activity Tolerance: Patient tolerated treatment well Patient left: in bed;with bed alarm set(with CM in room) Nurse Communication: Mobility status PT Visit Diagnosis: Unsteadiness on feet (R26.81);Difficulty in walking, not elsewhere classified (R26.2)    Time: 5883-2549 PT Time Calculation (min) (ACUTE ONLY): 20 min   Charges:   PT Evaluation $PT Eval Low Complexity: 1 Low PT Treatments $Therapeutic Activity: 8-22 mins   PT G CodesGreggory Stallion, PT, DPT [phone]   Alfons Sulkowski 11/05/2017, 12:46 PM

## 2017-11-05 NOTE — Care Management Note (Signed)
Case Management Note  Patient Details  Name: Kellie Harris MRN: [id number] Date of Birth: 06-18-1931  Subjective/Objective:    Admitted to [facility] with the diagnosis of TIA under observation status. Lives alone. Daughter is Kristie Daughenbaugh. States she has an appointment next week with Dr. Netty Starring. Prescriptions are filled at [facility] in [place] in the past. [facility] x 2 in the past. No home oxygen. Currently in the outpatient physical therapy program at this facility. Takes care of all basic and instrumental activities of daily living herself, drives.   Three rolling walker, 3 canes, raised toilet seat, and shower beach in the home. [employer] [place] last January. Decreased appetite. Lost 10 pounds since January.   Daughter will transport            Action/Plan: Physical therapy evaluation completed. Recommending to continue with outpatient program Will give personal care information    Expected Discharge Date:                  Expected Discharge Plan:     In-House Referral:   yes  Discharge planning Services     Post Acute Care Choice:    Choice offered to:     DME Arranged:    DME Agency:     HH Arranged:    HH Agency:     Status of Service:     If discussed at [employer], dates discussed:    Additional Comments:  Shelbie Ammons, RN MSN CCM Care Management [phone] 11/05/2017, 10:31 AM

## 2017-11-05 NOTE — Plan of Care (Signed)
Pt is d/ced home.  CT and MRI negative for stroke.  Believe this was a TIA.  Pt's only deficit was expressive aphasia.  Much improved from this am when she couldn't read the phrases or describe the pictures. NIH was a 2 this am.  Pt has ambulated well to the BR.  Lipid panel good.  Pt will go home on plavix. She has some musculoskeletal discomfort - Left shoulder with arthritis; left hip fractured but not repaired; Right knee replacement.  Will transport home with daughter.

## 2017-11-05 NOTE — Progress Notes (Signed)
OT Cancellation Note  Patient Details Name: Kellie Harris MRN: [id number] DOB: July 14, 1931   Cancelled Treatment:    Reason Eval/Treat Not Completed: OT screened, no needs identified, will sign off. Order received, chart reviewed. Pt seated EOB finishing lunch. Pt near baseline at this time, denies any deficits or neurological complaints. Briefly discussed falls prevention strategies and s/s for stroke and encouraged pt to have someone with her if she attempts a tub bath in the future. Pt verbalized understanding. <8 minutes spent; no skilled OT needs at this time. Will sign off. Please re-consult if additional needs arise.    Jeni Salles, MPH, MS, OTR/L ascom [phone] 11/05/17, 2:54 PM

## 2017-11-05 NOTE — Care Management Obs Status (Addendum)
MEDICARE OBSERVATION STATUS NOTIFICATION   Patient Details  Name: Kellie Harris MRN: [id number] Date of Birth: 1931-03-24   Medicare Observation Status Notification Given:  Yes. Permission to sign given per Ms Neukam    Shelbie Ammons, RN 11/05/2017, 10:30 AM

## 2017-11-05 NOTE — Consult Note (Addendum)
Referring Physician: Sudini    Chief Complaint: Confusion, near syncope  HPI: Kellie Harris is an 84 y.o. female who was at the store on yesterday and had a near syncopal event. Was after ward confused and had difficulty getting her words out.  Was brought to the ED for evaluation.  Reports being back to baseline today.   Initial NIHSS of 6.  Date last known well: Date: 11/04/2017 Time last known well: Unable to determine tPA Given: No: Unclear time of onset  Past Medical History:  Diagnosis Date  . Arthritis    right knee, left shoulder/ OSTEO  . Cancer ([place])    skin   . Degenerative disc disease, lumbar    L4-L5  . Diverticulosis    ITIS  . History of hiatal hernia   . Hydronephrosis    right  . Hypercholesteremia   . Hypertension    CONTROLLED ON MEDS  . IBS (irritable bowel syndrome)   . Knee fracture, right 03/05/2015   DIFFICULTY WITH AMBULATION  . Osteopenia   . Ovarian cyst   . Pancreatic insufficiency   . Wears hearing aid    bilateral    Past Surgical History:  Procedure Laterality Date  . CATARACT EXTRACTION W/PHACO Right 11/02/2015   Procedure: CATARACT EXTRACTION PHACO AND INTRAOCULAR LENS PLACEMENT (IOC);  Surgeon: Leandrew Koyanagi, MD;  Location: [facility];  Service: Ophthalmology;  Laterality: Right;  . CATARACT EXTRACTION W/PHACO Left 07/18/2016   Procedure: CATARACT EXTRACTION PHACO AND INTRAOCULAR LENS PLACEMENT (IOC);  Surgeon: Leandrew Koyanagi, MD;  Location: [facility];  Service: Ophthalmology;  Laterality: Left;  . COLONOSCOPY    . FRACTURE SURGERY Left    hip  . INTRAMEDULLARY (IM) NAIL INTERTROCHANTERIC Left 02/12/2017   Procedure: INTRAMEDULLARY (IM) NAIL INTERTROCHANTRIC;  Surgeon: Earnestine Leys, MD;  Location: [facility];  Service: Orthopedics;  Laterality: Left;  . kidney stent    . OOPHORECTOMY Bilateral 2015  . TONSILLECTOMY    . TOTAL KNEE ARTHROPLASTY Right 08/07/2017   Procedure: TOTAL KNEE ARTHROPLASTY;  Surgeon:  Earnestine Leys, MD;  Location: [facility];  Service: Orthopedics;  Laterality: Right;    Family History  Problem Relation Age of Onset  . Breast cancer Mother 46  . Leukemia Mother   . Stroke Father    Social History:  reports that she has never smoked. She has never used smokeless tobacco. She reports that she does not drink alcohol or use drugs.  Allergies: No Known Allergies  Medications:  I have reviewed the patient's current medications. Prior to Admission:  Medications Prior to Admission  Medication Sig Dispense Refill Last Dose  . celecoxib (CELEBREX) 200 MG capsule Take 1 capsule (200 mg total) by mouth every 12 (twelve) hours. 30 capsule 3 unknown at unknown  . gabapentin (NEURONTIN) 100 MG capsule Take 1 capsule (100 mg total) by mouth 3 (three) times daily. (Patient taking differently: Take 400 mg by mouth 3 (three) times daily. ) 60 capsule 3 unknown at unknown  . lisinopril (PRINIVIL,ZESTRIL) 20 MG tablet Take 20 mg by mouth 2 (two) times daily.   unknown at unknown  . methocarbamol (ROBAXIN) 500 MG tablet Take 1 tablet (500 mg total) by mouth 3 (three) times daily. (Patient taking differently: Take 500 mg by mouth 2 (two) times daily. ) 75 tablet 1 unknown at unknown  . pravastatin (PRAVACHOL) 20 MG tablet Take 1 tablet (20 mg total) by mouth at bedtime. 30 tablet 3 unknown at unknown  . amLODipine (NORVASC)  5 MG tablet Take 5 mg by mouth daily.   unknown at unknown  . aspirin EC 325 MG tablet Take 1 tablet (325 mg total) by mouth 2 (two) times daily. Take for 4 weeks (Patient not taking: Reported on 09/16/2017) 60 tablet 0 unknown at unknown  . aspirin EC 81 MG tablet Take 81 mg by mouth daily.   unknown at unknown  . HYDROcodone-acetaminophen (NORCO) 5-325 MG tablet Take 1 tablet by mouth every 6 (six) hours as needed for moderate pain or severe pain. (Patient not taking: Reported on 09/16/2017) 20 tablet 0 Not Taking at Unknown time   Scheduled: . amLODipine  5 mg Oral Daily   . aspirin  300 mg Rectal Daily   Or  . aspirin  325 mg Oral Daily  . enoxaparin (LOVENOX) injection  40 mg Subcutaneous Q24H  . gabapentin  400 mg Oral TID  . lisinopril  20 mg Oral BID  . pravastatin  20 mg Oral QHS    ROS: History obtained from the patient  General ROS: negative for - chills, fatigue, fever, night sweats, weight gain or weight loss Psychological ROS: negative for - behavioral disorder, hallucinations, memory difficulties, mood swings or suicidal ideation Ophthalmic ROS: negative for - blurry vision, double vision, eye pain or loss of vision ENT ROS: negative for - epistaxis, nasal discharge, oral lesions, sore throat, tinnitus or vertigo Allergy and Immunology ROS: negative for - hives or itchy/watery eyes Hematological and Lymphatic ROS: negative for - bleeding problems, bruising or swollen lymph nodes Endocrine ROS: negative for - galactorrhea, hair pattern changes, polydipsia/polyuria or temperature intolerance Respiratory ROS: negative for - cough, hemoptysis, shortness of breath or wheezing Cardiovascular ROS: negative for - chest pain, dyspnea on exertion, edema or irregular heartbeat Gastrointestinal ROS: negative for - abdominal pain, diarrhea, hematemesis, nausea/vomiting or stool incontinence Genito-Urinary ROS: negative for - dysuria, hematuria, incontinence or urinary frequency/urgency Musculoskeletal ROS: joint pain, decreased ROM left shoulder Neurological ROS: as noted in HPI Dermatological ROS: negative for rash and skin lesion changes  Physical Examination: Blood pressure (!) 152/99, pulse 98, temperature 98 F (36.7 C), temperature source Oral, resp. rate 16, height 4\' 11"  (1.499 m), weight 44.7 kg (98 lb 8 oz), SpO2 96 %.  HEENT-  Normocephalic, no lesions, without obvious abnormality.  Normal external eye and conjunctiva.  Normal TM's bilaterally.  Normal auditory canals and external ears. Normal external nose, mucus membranes and septum.   Normal pharynx. Cardiovascular- S1, S2 normal, pulses palpable throughout   Lungs- chest clear, no wheezing, rales, normal symmetric air entry Abdomen- soft, non-tender; bowel sounds normal; no masses,  no organomegaly Extremities- no edema Lymph-no adenopathy palpable Musculoskeletal-arthritic changes Skin-warm and Wisor, no hyperpigmentation, vitiligo, or suspicious lesions  Neurological Examination   Mental Status: Alert, oriented, thought content appropriate.  Speech fluent without evidence of aphasia.  Able to follow 3 step commands without difficulty. Cranial Nerves: II: Discs flat bilaterally; Visual fields grossly normal, pupils equal, round, reactive to light and accommodation III,IV, VI: ptosis not present, extra-ocular motions intact bilaterally V,VII: smile symmetric, facial light touch sensation normal bilaterally VIII: hearing normal bilaterally IX,X: gag reflex present XI: bilateral shoulder shrug XII: midline tongue extension Motor: Right : Upper extremity   5/5    Left:     Upper extremity   5/5 with ROM limitations  Lower extremity   5/5     Lower extremity   5/5 Tone and bulk:normal tone throughout; no atrophy noted Sensory: Pinprick and light  touch intact throughout, bilaterally Deep Tendon Reflexes: 2+ and symmetric throughout Plantars: Right: downgoing   Left: downgoing Cerebellar: Normal finger-to-nose and normal heel-to-shin testing bilaterally Gait: not tested due to safety concerns    Laboratory Studies:  Basic Metabolic Panel: Recent Labs  Lab 11/04/17 1628  NA 136  K 3.3*  CL 102  CO2 20*  GLUCOSE 137*  BUN 13  CREATININE 0.84  CALCIUM 10.2    Liver Function Tests: Recent Labs  Lab 11/04/17 1628  AST 21  ALT 12*  ALKPHOS 88  BILITOT 0.8  PROT 6.9  ALBUMIN 4.2   No results for input(s): LIPASE, AMYLASE in the last 168 hours. No results for input(s): AMMONIA in the last 168 hours.  CBC: Recent Labs  Lab 11/04/17 1628  WBC 12.0*   NEUTROABS 9.9*  HGB 12.3  HCT 36.2  MCV 83.0  PLT 270    Cardiac Enzymes: Recent Labs  Lab 11/04/17 1628  TROPONINI <0.03    BNP: Invalid input(s): POCBNP  CBG: Recent Labs  Lab 11/04/17 1621  GLUCAP 112*    Microbiology: Results for orders placed or performed during the hospital encounter of 08/24/17  Surgical pcr screen     Status: None   Collection Time: 08/25/17  5:53 AM  Result Value Ref Range Status   MRSA, PCR NEGATIVE NEGATIVE Final   Staphylococcus aureus NEGATIVE NEGATIVE Final    Comment: (NOTE) The Xpert SA Assay (FDA approved for NASAL specimens in patients 79 years of age and older), is one component of a comprehensive surveillance program. It is not intended to diagnose infection nor to guide or monitor treatment. Performed at [facility], [address] [place], [place] [address]     Coagulation Studies: Recent Labs    11/04/17 1628  LABPROT 13.0  INR 0.99    Urinalysis:  Recent Labs  Lab 11/04/17 2039  COLORURINE STRAW*  LABSPEC 1.014  PHURINE 7.0  GLUCOSEU NEGATIVE  HGBUR MODERATE*  BILIRUBINUR NEGATIVE  KETONESUR NEGATIVE  PROTEINUR NEGATIVE  NITRITE NEGATIVE  LEUKOCYTESUR TRACE*    Lipid Panel:    Component Value Date/Time   CHOL 147 11/05/2017 0521   TRIG 109 11/05/2017 0521   HDL 46 11/05/2017 0521   CHOLHDL 3.2 11/05/2017 0521   VLDL 22 11/05/2017 0521   LDLCALC 79 11/05/2017 0521    HgbA1C:  Lab Results  Component Value Date   HGBA1C 4.8 07/24/2017    Urine Drug Screen:      Component Value Date/Time   LABOPIA NONE DETECTED 11/04/2017 2039   COCAINSCRNUR NONE DETECTED 11/04/2017 2039   LABBENZ POSITIVE (A) 11/04/2017 2039   AMPHETMU NONE DETECTED 11/04/2017 2039   THCU NONE DETECTED 11/04/2017 2039   LABBARB NONE DETECTED 11/04/2017 2039    Alcohol Level: No results for input(s): ETH in the last 168 hours.  Other results: EKG: sinus tachycardia at 100 bpm.  Imaging: Ct Angio Head W  Or Wo Contrast  Result Date: 11/04/2017 CLINICAL DATA:  Initial evaluation for acute altered mental status, syncopal episode. EXAM: CT ANGIOGRAPHY HEAD AND NECK CT PERFUSION BRAIN TECHNIQUE: Multidetector CT imaging of the head and neck was performed using the standard protocol during bolus administration of intravenous contrast. Multiplanar CT image reconstructions and MIPs were obtained to evaluate the vascular anatomy. Carotid stenosis measurements (when applicable) are obtained utilizing NASCET criteria, using the distal internal carotid diameter as the denominator. Multiphase CT imaging of the brain was performed following IV bolus contrast injection. Subsequent parametric perfusion  maps were calculated using RAPID software. CONTRAST:  15mL ISOVUE-370 IOPAMIDOL (ISOVUE-370) INJECTION 76% COMPARISON:  Prior CT from earlier same day. FINDINGS: CTA NECK FINDINGS Aortic arch: Visualized aortic arch of normal caliber with normal 3 vessel morphology. No flow-limiting stenosis about the origin of the great vessels. Scattered atheromatous plaque noted within the arch itself. Visualized subclavian arteries widely patent. Right carotid system: Right common carotid artery tortuous proximally but widely patent to the bifurcation. Minimal plaque about the right carotid bifurcation without stenosis. Right ICA widely patent from the bifurcation to the skull base without stenosis, dissection, or occlusion. Left carotid system: Left common carotid artery tortuous proximally but widely patent to the bifurcation without stenosis. Scattered calcified plaque about the left bifurcation without significant atheromatous narrowing. Left ICA patent from the bifurcation to the skull base without stenosis, dissection or occlusion. Vertebral arteries: Both of the vertebral arteries arise from the subclavian arteries. Right vertebral artery dominant. Vertebral arteries widely patent within the neck without stenosis, dissection, or  occlusion. Skeleton: No acute osseus abnormality. No worrisome lytic or blastic osseous lesions. Moderate degenerative spondylolysis present at C3-4 through C6-7. Other neck: No acute soft tissue abnormality within the neck. Salivary glands within normal limits. No adenopathy. Chronic left maxillary sinusitis noted. Thyroid normal. Upper chest: Visualized lungs are grossly clear. Review of the MIP images confirms the above findings CTA HEAD FINDINGS Anterior circulation: Petrous segments widely patent bilaterally. Scattered atheromatous plaque within the cavernous/supraclinoid ICAs without significant stenosis. ICA termini widely patent. A1 segments patent bilaterally. Normal anterior communicating artery. Anterior cerebral arteries widely patent to their distal aspects without stenosis. Left M1 segment widely patent without stenosis. No proximal M2 occlusion. Distal left MCA branches well perfused. Right M1 segment patent without stenosis. Approximate 3 mm focal outpouching arising from the mid right M1 segment suspicious for small aneurysm (series 8, image 98). Normal right MCA bifurcation. No proximal right M2 occlusion. Distal right MCA branches well perfused. Posterior circulation: Right vertebral artery dominant. Focal mild stenosis within the left V4 segment as it crosses into the cranial vault (series 8, image 143). Additional atheromatous plaque within the bilateral V4 segments with mild to moderate stenoses. Posterior inferior cerebral arteries patent bilaterally. Basilar artery widely patent to its distal aspect. Superior cerebral arteries patent bilaterally. Both of the posterior cerebral arteries primarily supplied via the basilar and are widely patent to their distal aspects without stenosis. Venous sinuses: Patent. Anatomic variants: None significant. Delayed phase: Not performed. Review of the MIP images confirms the above findings CT Brain Perfusion Findings: CBF (<30%) Volume: 80mL Perfusion  (Tmax>6.0s) volume: 47mL Mismatch Volume: 52mL Infarction Location:No acute stroke by CT perfusion. IMPRESSION: 1. Negative CTA for emergent large vessel occlusion. No evidence for acute stroke by CT perfusion. 2. Mild atheromatous change involving the major arterial vasculature of the head and neck as above, most notable within the bilateral V4 segments were there are mild to moderate stenoses. No other hemodynamically significant or correctable stenosis identified. 3. 3 mm focal outpouching arising from the right M1 segment, suspicious for aneurysm. Electronically Signed   By: Jeannine Boga M.D.   On: 11/04/2017 19:55   Ct Angio Neck W Or Wo Contrast  Result Date: 11/04/2017 CLINICAL DATA:  Initial evaluation for acute altered mental status, syncopal episode. EXAM: CT ANGIOGRAPHY HEAD AND NECK CT PERFUSION BRAIN TECHNIQUE: Multidetector CT imaging of the head and neck was performed using the standard protocol during bolus administration of intravenous contrast. Multiplanar CT image reconstructions and MIPs  were obtained to evaluate the vascular anatomy. Carotid stenosis measurements (when applicable) are obtained utilizing NASCET criteria, using the distal internal carotid diameter as the denominator. Multiphase CT imaging of the brain was performed following IV bolus contrast injection. Subsequent parametric perfusion maps were calculated using RAPID software. CONTRAST:  132mL ISOVUE-370 IOPAMIDOL (ISOVUE-370) INJECTION 76% COMPARISON:  Prior CT from earlier same day. FINDINGS: CTA NECK FINDINGS Aortic arch: Visualized aortic arch of normal caliber with normal 3 vessel morphology. No flow-limiting stenosis about the origin of the great vessels. Scattered atheromatous plaque noted within the arch itself. Visualized subclavian arteries widely patent. Right carotid system: Right common carotid artery tortuous proximally but widely patent to the bifurcation. Minimal plaque about the right carotid bifurcation  without stenosis. Right ICA widely patent from the bifurcation to the skull base without stenosis, dissection, or occlusion. Left carotid system: Left common carotid artery tortuous proximally but widely patent to the bifurcation without stenosis. Scattered calcified plaque about the left bifurcation without significant atheromatous narrowing. Left ICA patent from the bifurcation to the skull base without stenosis, dissection or occlusion. Vertebral arteries: Both of the vertebral arteries arise from the subclavian arteries. Right vertebral artery dominant. Vertebral arteries widely patent within the neck without stenosis, dissection, or occlusion. Skeleton: No acute osseus abnormality. No worrisome lytic or blastic osseous lesions. Moderate degenerative spondylolysis present at C3-4 through C6-7. Other neck: No acute soft tissue abnormality within the neck. Salivary glands within normal limits. No adenopathy. Chronic left maxillary sinusitis noted. Thyroid normal. Upper chest: Visualized lungs are grossly clear. Review of the MIP images confirms the above findings CTA HEAD FINDINGS Anterior circulation: Petrous segments widely patent bilaterally. Scattered atheromatous plaque within the cavernous/supraclinoid ICAs without significant stenosis. ICA termini widely patent. A1 segments patent bilaterally. Normal anterior communicating artery. Anterior cerebral arteries widely patent to their distal aspects without stenosis. Left M1 segment widely patent without stenosis. No proximal M2 occlusion. Distal left MCA branches well perfused. Right M1 segment patent without stenosis. Approximate 3 mm focal outpouching arising from the mid right M1 segment suspicious for small aneurysm (series 8, image 98). Normal right MCA bifurcation. No proximal right M2 occlusion. Distal right MCA branches well perfused. Posterior circulation: Right vertebral artery dominant. Focal mild stenosis within the left V4 segment as it crosses  into the cranial vault (series 8, image 143). Additional atheromatous plaque within the bilateral V4 segments with mild to moderate stenoses. Posterior inferior cerebral arteries patent bilaterally. Basilar artery widely patent to its distal aspect. Superior cerebral arteries patent bilaterally. Both of the posterior cerebral arteries primarily supplied via the basilar and are widely patent to their distal aspects without stenosis. Venous sinuses: Patent. Anatomic variants: None significant. Delayed phase: Not performed. Review of the MIP images confirms the above findings CT Brain Perfusion Findings: CBF (<30%) Volume: 28mL Perfusion (Tmax>6.0s) volume: 18mL Mismatch Volume: 68mL Infarction Location:No acute stroke by CT perfusion. IMPRESSION: 1. Negative CTA for emergent large vessel occlusion. No evidence for acute stroke by CT perfusion. 2. Mild atheromatous change involving the major arterial vasculature of the head and neck as above, most notable within the bilateral V4 segments were there are mild to moderate stenoses. No other hemodynamically significant or correctable stenosis identified. 3. 3 mm focal outpouching arising from the right M1 segment, suspicious for aneurysm. Electronically Signed   By: Jeannine Boga M.D.   On: 11/04/2017 19:55   Mr Brain Wo Contrast  Result Date: 11/05/2017 CLINICAL DATA:  Stroke follow-up EXAM: MRI HEAD WITHOUT  CONTRAST MRA HEAD WITHOUT CONTRAST TECHNIQUE: Multiplanar, multiecho pulse sequences of the brain and surrounding structures were obtained without intravenous contrast. Angiographic images of the head were obtained using MRA technique without contrast. COMPARISON:  Head CT and CTA from yesterday.  Brain MRI 02/11/2017 FINDINGS: MRI HEAD FINDINGS Brain: No acute infarction, hemorrhage, hydrocephalus, extra-axial collection or mass lesion. Moderate chronic ischemic gliosis in the cerebral white matter. Remote lacunar infarcts in the right thalamus and right  corona radiata. Small remote bilateral cerebellar infarcts. Vascular: Arterial findings below. Normal dural venous sinus flow voids. Skull and upper cervical spine: No evidence of marrow lesion Sinuses/Orbits: Chronic left maxillary sinusitis with opacification and sinus atelectasis, stable from 2018. MRA HEAD FINDINGS Symmetric carotid arteries. Right vertebral artery dominance. Atheromatous irregularity of bilateral V4 segments with mild narrowing. Poor signal of proximal right M2 branches on reformats is considered artifactual based on source images. No major branch occlusion or proximal flow limiting stenosis. 1 mm superior outpouching from the distal right M1 segment, location favoring a lenticulostriate infundibulum below the resolution of MRA. IMPRESSION: Brain MRI: 1. No acute finding.  No acute infarct. 2. Moderate chronic small vessel ischemic injury as described. 3. Chronic left maxillary sinusitis. Intracranial MRA: Stable from CTA yesterday. No emergent large vessel occlusion or proximal flow limiting stenosis. Electronically Signed   By: Monte Fantasia M.D.   On: 11/05/2017 09:36   US Carotid Bilateral (at [facility] Only)  Result Date: 11/05/2017 CLINICAL DATA:  CVA, hypertension, syncope, visual disturbance EXAM: BILATERAL CAROTID DUPLEX ULTRASOUND TECHNIQUE: Pearline Cables scale imaging, color Doppler and duplex ultrasound were performed of bilateral carotid and vertebral arteries in the neck. COMPARISON:  11/07/2016 FINDINGS: Criteria: Quantification of carotid stenosis is based on velocity parameters that correlate the residual internal carotid diameter with NASCET-based stenosis levels, using the diameter of the distal internal carotid lumen as the denominator for stenosis measurement. The following velocity measurements were obtained: RIGHT ICA:  57/16 cm/sec CCA:  47/09 cm/sec SYSTOLIC ICA/CCA RATIO:  0.7 DIASTOLIC ICA/CCA RATIO:  0.9 ECA:  76 cm/sec LEFT ICA:  78/19 cm/sec CCA:  62/83 cm/sec  SYSTOLIC ICA/CCA RATIO:  1.1 DIASTOLIC ICA/CCA RATIO:  1.1 ECA:  71 cm/sec RIGHT CAROTID ARTERY: Minor echogenic shadowing plaque formation. No hemodynamically significant right ICA stenosis, velocity elevation, or turbulent flow. Degree of narrowing less than 50%. RIGHT VERTEBRAL ARTERY:  Antegrade LEFT CAROTID ARTERY: Similar scattered minor echogenic plaque formation. No hemodynamically significant left ICA stenosis, velocity elevation, or turbulent flow. LEFT VERTEBRAL ARTERY:  Antegrade IMPRESSION: Minor carotid atherosclerosis. No hemodynamically significant ICA stenosis. Degree of narrowing less than 50% bilaterally by ultrasound criteria. Patent antegrade vertebral flow bilaterally. Electronically Signed   By: Jerilynn Mages.  Shick M.D.   On: 11/05/2017 10:16   Ct Cerebral Perfusion W Contrast  Result Date: 11/04/2017 CLINICAL DATA:  Initial evaluation for acute altered mental status, syncopal episode. EXAM: CT ANGIOGRAPHY HEAD AND NECK CT PERFUSION BRAIN TECHNIQUE: Multidetector CT imaging of the head and neck was performed using the standard protocol during bolus administration of intravenous contrast. Multiplanar CT image reconstructions and MIPs were obtained to evaluate the vascular anatomy. Carotid stenosis measurements (when applicable) are obtained utilizing NASCET criteria, using the distal internal carotid diameter as the denominator. Multiphase CT imaging of the brain was performed following IV bolus contrast injection. Subsequent parametric perfusion maps were calculated using RAPID software. CONTRAST:  125mL ISOVUE-370 IOPAMIDOL (ISOVUE-370) INJECTION 76% COMPARISON:  Prior CT from earlier same day. FINDINGS: CTA NECK FINDINGS Aortic arch:  Visualized aortic arch of normal caliber with normal 3 vessel morphology. No flow-limiting stenosis about the origin of the great vessels. Scattered atheromatous plaque noted within the arch itself. Visualized subclavian arteries widely patent. Right carotid system:  Right common carotid artery tortuous proximally but widely patent to the bifurcation. Minimal plaque about the right carotid bifurcation without stenosis. Right ICA widely patent from the bifurcation to the skull base without stenosis, dissection, or occlusion. Left carotid system: Left common carotid artery tortuous proximally but widely patent to the bifurcation without stenosis. Scattered calcified plaque about the left bifurcation without significant atheromatous narrowing. Left ICA patent from the bifurcation to the skull base without stenosis, dissection or occlusion. Vertebral arteries: Both of the vertebral arteries arise from the subclavian arteries. Right vertebral artery dominant. Vertebral arteries widely patent within the neck without stenosis, dissection, or occlusion. Skeleton: No acute osseus abnormality. No worrisome lytic or blastic osseous lesions. Moderate degenerative spondylolysis present at C3-4 through C6-7. Other neck: No acute soft tissue abnormality within the neck. Salivary glands within normal limits. No adenopathy. Chronic left maxillary sinusitis noted. Thyroid normal. Upper chest: Visualized lungs are grossly clear. Review of the MIP images confirms the above findings CTA HEAD FINDINGS Anterior circulation: Petrous segments widely patent bilaterally. Scattered atheromatous plaque within the cavernous/supraclinoid ICAs without significant stenosis. ICA termini widely patent. A1 segments patent bilaterally. Normal anterior communicating artery. Anterior cerebral arteries widely patent to their distal aspects without stenosis. Left M1 segment widely patent without stenosis. No proximal M2 occlusion. Distal left MCA branches well perfused. Right M1 segment patent without stenosis. Approximate 3 mm focal outpouching arising from the mid right M1 segment suspicious for small aneurysm (series 8, image 98). Normal right MCA bifurcation. No proximal right M2 occlusion. Distal right MCA  branches well perfused. Posterior circulation: Right vertebral artery dominant. Focal mild stenosis within the left V4 segment as it crosses into the cranial vault (series 8, image 143). Additional atheromatous plaque within the bilateral V4 segments with mild to moderate stenoses. Posterior inferior cerebral arteries patent bilaterally. Basilar artery widely patent to its distal aspect. Superior cerebral arteries patent bilaterally. Both of the posterior cerebral arteries primarily supplied via the basilar and are widely patent to their distal aspects without stenosis. Venous sinuses: Patent. Anatomic variants: None significant. Delayed phase: Not performed. Review of the MIP images confirms the above findings CT Brain Perfusion Findings: CBF (<30%) Volume: 77mL Perfusion (Tmax>6.0s) volume: 35mL Mismatch Volume: 42mL Infarction Location:No acute stroke by CT perfusion. IMPRESSION: 1. Negative CTA for emergent large vessel occlusion. No evidence for acute stroke by CT perfusion. 2. Mild atheromatous change involving the major arterial vasculature of the head and neck as above, most notable within the bilateral V4 segments were there are mild to moderate stenoses. No other hemodynamically significant or correctable stenosis identified. 3. 3 mm focal outpouching arising from the right M1 segment, suspicious for aneurysm. Electronically Signed   By: Jeannine Boga M.D.   On: 11/04/2017 19:55   Mr Jodene Nam Head/brain FB Cm  Result Date: 11/05/2017 CLINICAL DATA:  Stroke follow-up EXAM: MRI HEAD WITHOUT CONTRAST MRA HEAD WITHOUT CONTRAST TECHNIQUE: Multiplanar, multiecho pulse sequences of the brain and surrounding structures were obtained without intravenous contrast. Angiographic images of the head were obtained using MRA technique without contrast. COMPARISON:  Head CT and CTA from yesterday.  Brain MRI 02/11/2017 FINDINGS: MRI HEAD FINDINGS Brain: No acute infarction, hemorrhage, hydrocephalus, extra-axial  collection or mass lesion. Moderate chronic ischemic gliosis in the cerebral white  matter. Remote lacunar infarcts in the right thalamus and right corona radiata. Small remote bilateral cerebellar infarcts. Vascular: Arterial findings below. Normal dural venous sinus flow voids. Skull and upper cervical spine: No evidence of marrow lesion Sinuses/Orbits: Chronic left maxillary sinusitis with opacification and sinus atelectasis, stable from 2018. MRA HEAD FINDINGS Symmetric carotid arteries. Right vertebral artery dominance. Atheromatous irregularity of bilateral V4 segments with mild narrowing. Poor signal of proximal right M2 branches on reformats is considered artifactual based on source images. No major branch occlusion or proximal flow limiting stenosis. 1 mm superior outpouching from the distal right M1 segment, location favoring a lenticulostriate infundibulum below the resolution of MRA. IMPRESSION: Brain MRI: 1. No acute finding.  No acute infarct. 2. Moderate chronic small vessel ischemic injury as described. 3. Chronic left maxillary sinusitis. Intracranial MRA: Stable from CTA yesterday. No emergent large vessel occlusion or proximal flow limiting stenosis. Electronically Signed   By: Monte Fantasia M.D.   On: 11/05/2017 09:36   Ct Head Code Stroke Wo Contrast  Result Date: 11/04/2017 CLINICAL DATA:  Code stroke. Aphasia and confusion developed over the last hour. EXAM: CT HEAD WITHOUT CONTRAST TECHNIQUE: Contiguous axial images were obtained from the base of the skull through the vertex without intravenous contrast. COMPARISON:  MRI 02/11/2017 FINDINGS: Brain: Generalized brain atrophy. Chronic small-vessel ischemic changes throughout the cerebral hemispheric white matter. Old lacunar infarctions in the right thalamus and right frontal white matter. No sign of acute infarction, mass lesion, hemorrhage, hydrocephalus or extra-axial collection. Vascular: There is atherosclerotic calcification of the  major vessels at the base of the brain. Skull: Negative Sinuses/Orbits: Left maxillary sinus inflammatory changes. Other sinuses are clear. Orbits negative. Other: None ASPECTS ([place] Stroke Program Early CT Score) - Ganglionic level infarction (caudate, lentiform nuclei, internal capsule, insula, M1-M3 cortex): 7 - Supraganglionic infarction (M4-M6 cortex): 3 Total score (0-10 with 10 being normal): 10 IMPRESSION: 1. No acute finding by CT. Atrophy and extensive chronic ischemic changes seen throughout as above. 2. ASPECTS is 10. 3. These results were communicated to [place] at [place] 11/04/2017 via telephone. Electronically Signed   By: Nelson Chimes M.D.   On: 11/04/2017 16:47    Assessment: 84 y.o. female presenting with a near syncopal episode and confusion.  Etiology unclear.  MRI of the brain reviewed and shows no acute changes.  Carotid dopplers show no evidence of hemodynamically significant stenosis.  Echocardiogram pending.  LDL 79.  Patient currently on ASA at home Seizure on the differential as well as TIA.    Stroke Risk Factors - hyperlipidemia and hypertension  Plan: 1. HgbA1c 2. Prophylactic therapy-Increase ASA to 325mg  daily 3. Telemetry monitoring 4. Frequent neuro checks 5. EEG 6. Follow up with neurology as an outpatient 7. Would consider prolonged cardiac monitoring as well.      Alexis Goodell, MD Neurology [phone] 11/05/2017, 1:44 PM

## 2017-11-05 NOTE — Progress Notes (Signed)
PT Cancellation Note  Patient Details Name: KOURTNEE LAHEY MRN: [id number] DOB: 1930/10/12   Cancelled Treatment:    Reason Eval/Treat Not Completed: Other (comment). Pt currently out of room for imaging, will re-attempt at another time.   Evie Crumpler 11/05/2017, 9:46 AM Greggory Stallion, PT, DPT [phone]

## 2017-11-05 NOTE — Progress Notes (Signed)
*  PRELIMINARY RESULTS* Echocardiogram 2D Echocardiogram has been performed.  Kellie Harris 11/05/2017, 1:26 PM

## 2017-11-07 ENCOUNTER — Encounter: Payer: Self-pay | Admitting: *Deleted

## 2017-11-08 DIAGNOSIS — M25552 Pain in left hip: Secondary | ICD-10-CM | POA: Diagnosis not present

## 2017-11-08 DIAGNOSIS — M25561 Pain in right knee: Secondary | ICD-10-CM | POA: Diagnosis not present

## 2017-11-08 NOTE — Discharge Summary (Signed)
Kellie Harris    MR#:  [id number]  DATE OF BIRTH:  [birth year]  DATE OF ADMISSION:  11/04/2017 ADMITTING PHYSICIAN: Saundra Shelling, MD  DATE OF DISCHARGE: 11/05/2017  5:07 PM  PRIMARY CARE PHYSICIAN: Dion Body, MD   ADMISSION DIAGNOSIS:  Aphasia [R47.01] TIA (transient ischemic attack) [G45.9]  DISCHARGE DIAGNOSIS:  Active Problems:   TIA (transient ischemic attack)   Syncope   SECONDARY DIAGNOSIS:   Past Medical History:  Diagnosis Date  . Arthritis    right knee, left shoulder/ OSTEO  . Cancer ([place])    skin   . Degenerative disc disease, lumbar    L4-L5  . Diverticulosis    ITIS  . History of hiatal hernia   . Hydronephrosis    right  . Hypercholesteremia   . Hypertension    CONTROLLED ON MEDS  . IBS (irritable bowel syndrome)   . Knee fracture, right 03/05/2015   DIFFICULTY WITH AMBULATION  . Osteopenia   . Ovarian cyst   . Pancreatic insufficiency   . Wears hearing aid    bilateral     ADMITTING HISTORY  HISTORY OF PRESENT ILLNESS: Kellie Harris  is a 84 y.o. female with a known history of hyperlipidemia, hypertension, degenerative disc disease presented to the emergency room after she was about to pass out.  Patient had a near syncopal episode and she was brought to the emergency room by EMS.  Patient had this episode at the [facility] store.  After she arrived to the emergency room she was noted to be confused not following any commands and she had trouble expressing words patient was worked up with CT head which showed no acute abnormality.  Later on her mental status improved she is back to baseline and with no difficulty in speech.  Patient is hard of hearing and uses hearing aids.  Patient was evaluated by tele neurology who recommended no TPA.  Advised to start patient on oral aspirin, inpatient neurology consultation and check MRI brain and echocardiogram no tingling or numbness in  any part of the body. No weakness in any part of the body.  Patient also had CTA neck which showed no acute abnormality.    HOSPITAL COURSE:   *TIA.  Patient was admitted to medical floor with telemetry monitoring.  Neurochecks.  Started on aspirin and statin.  No arrhythmias on telemetry.  Echocardiogram and carotid Dopplers showed nothing acute. Patient was seen by neurology.  EEG checked and showed nothing acute.  Patient will be discharged home on aspirin and statin at this time.  Her expressive aphasia is much improved.  Has been ambulating with physical therapy.  Patient's other comorbidities remained stable.  Discharged home in stable condition.  CONSULTS OBTAINED:  Treatment Team:  Alexis Goodell, MD  DRUG ALLERGIES:  No Known Allergies  DISCHARGE MEDICATIONS:   Allergies as of 11/05/2017   No Known Allergies     Medication List    STOP taking these medications   aspirin EC 325 MG tablet   aspirin EC 81 MG tablet     TAKE these medications   amLODipine 5 MG tablet Commonly known as:  NORVASC Take 5 mg by mouth daily.   celecoxib 200 MG capsule Commonly known as:  CELEBREX Take 1 capsule (200 mg total) by mouth every 12 (twelve) hours.   clopidogrel 75 MG tablet Commonly known as:  PLAVIX Take 1 tablet (75 mg total) by mouth  daily.   gabapentin 100 MG capsule Commonly known as:  NEURONTIN Take 1 capsule (100 mg total) by mouth 3 (three) times daily. What changed:  how much to take   HYDROcodone-acetaminophen 5-325 MG tablet Commonly known as:  NORCO Take 1 tablet by mouth every 6 (six) hours as needed for moderate pain or severe pain.   lisinopril 20 MG tablet Commonly known as:  PRINIVIL,ZESTRIL Take 20 mg by mouth 2 (two) times daily.   methocarbamol 500 MG tablet Commonly known as:  ROBAXIN Take 1 tablet (500 mg total) by mouth 3 (three) times daily. What changed:  when to take this   pravastatin 20 MG tablet Commonly known as:   PRAVACHOL Take 1 tablet (20 mg total) by mouth at bedtime.       Today   VITAL SIGNS:  Blood pressure (!) 152/99, pulse 98, temperature 98 F (36.7 C), temperature source Oral, resp. rate 16, height 4\' 11"  (1.499 m), weight 44.7 kg (98 lb 8 oz), SpO2 96 %.  I/O:  No intake or output data in the 24 hours ending 11/08/17 1659  PHYSICAL EXAMINATION:  Physical Exam  GENERAL:  84 y.o.-year-old patient lying in the bed with no acute distress.  LUNGS: Normal breath sounds bilaterally, no wheezing, rales,rhonchi or crepitation. No use of accessory muscles of respiration.  CARDIOVASCULAR: S1, S2 normal. No murmurs, rubs, or gallops.  ABDOMEN: Soft, non-tender, non-distended. Bowel sounds present. No organomegaly or mass.  NEUROLOGIC: Moves all 4 extremities. PSYCHIATRIC: The patient is alert and oriented x 3.  SKIN: No obvious rash, lesion, or ulcer.   DATA REVIEW:   CBC Recent Labs  Lab 11/04/17 1628  WBC 12.0*  HGB 12.3  HCT 36.2  PLT 270    Chemistries  Recent Labs  Lab 11/04/17 1628  NA 136  K 3.3*  CL 102  CO2 20*  GLUCOSE 137*  BUN 13  CREATININE 0.84  CALCIUM 10.2  AST 21  ALT 12*  ALKPHOS 88  BILITOT 0.8    Cardiac Enzymes Recent Labs  Lab 11/04/17 1628  TROPONINI <0.03    Microbiology Results  Results for orders placed or performed during the hospital encounter of 08/24/17  Surgical pcr screen     Status: None   Collection Time: 08/25/17  5:53 AM  Result Value Ref Range Status   MRSA, PCR NEGATIVE NEGATIVE Final   Staphylococcus aureus NEGATIVE NEGATIVE Final    Comment: (NOTE) The Xpert SA Assay (FDA approved for NASAL specimens in patients 64 years of age and older), is one component of a comprehensive surveillance program. It is not intended to diagnose infection nor to guide or monitor treatment. Performed at [facility], [address], [place], [place] [address]     RADIOLOGY:  No results found.  Follow up with PCP in  1 week.  Management plans discussed with the patient, family and they are in agreement.  CODE STATUS:  Code Status History    Date Active Date Inactive Code Status Order ID Comments User Context   11/04/2017 2053 11/05/2017 2023 DNR [id number]  Saundra Shelling, MD ED   08/25/2017 0114 08/27/2017 1950 Full Code [id number]  Lance Coon, MD Inpatient   08/07/2017 1217 08/09/2017 1954 Full Code [id number]  Earnestine Leys, MD Inpatient   02/12/2017 0140 02/14/2017 2023 Full Code [id number]  Lance Coon, MD ED    Questions for Most Recent Historical Code Status (Order [id number])    Question Answer Comment   In the event  of cardiac or respiratory ARREST Do not call a "code blue"    In the event of cardiac or respiratory ARREST Do not perform Intubation, CPR, defibrillation or ACLS    In the event of cardiac or respiratory ARREST Use medication by any route, position, wound care, and other measures to relive pain and suffering. May use oxygen, suction and manual treatment of airway obstruction as needed for comfort.         Advance Directive Documentation     Most Recent Value  Type of Advance Directive  Healthcare Power of Attorney, Living will  Pre-existing out of facility DNR order (yellow form or pink MOST form)  -  "MOST" Form in Place?  -      TOTAL TIME TAKING CARE OF THIS PATIENT ON DAY OF DISCHARGE: more than 30 minutes.   Leia Alf Virgle Arth M.D on 11/08/2017 at 4:59 PM  Between 7am to 6pm - Pager - [phone]  After 6pm go to [email or web address] - password [email or web address] [facility]  Office  [phone]  CC: Primary care physician; Dion Body, MD  Note: This dictation was prepared with Dragon dictation along with smaller phrase technology. Any transcriptional errors that result from this process are unintentional.

## 2017-11-11 DIAGNOSIS — I10 Essential (primary) hypertension: Secondary | ICD-10-CM | POA: Diagnosis not present

## 2017-11-11 DIAGNOSIS — Z8673 Personal history of transient ischemic attack (TIA), and cerebral infarction without residual deficits: Secondary | ICD-10-CM | POA: Diagnosis not present

## 2017-11-14 DIAGNOSIS — M25552 Pain in left hip: Secondary | ICD-10-CM | POA: Diagnosis not present

## 2017-11-14 DIAGNOSIS — M25561 Pain in right knee: Secondary | ICD-10-CM | POA: Diagnosis not present

## 2017-11-22 ENCOUNTER — Encounter: Payer: Self-pay | Admitting: *Deleted

## 2017-11-22 ENCOUNTER — Ambulatory Visit: Payer: Self-pay | Admitting: *Deleted

## 2017-11-22 ENCOUNTER — Other Ambulatory Visit: Payer: Self-pay | Admitting: *Deleted

## 2017-11-22 NOTE — Patient Outreach (Signed)
[facility]) Care Management  11/22/2017  Kellie Harris [birth year] [id number]   Transition of care   84 yo female with recent history of fall and fracture to left hip. Recent Right TKA 08/07/17, PMHx includes, HTN, Hyperlipidemia   Following patient for transition of care after discharge from [facility] for rehab after right hip surgery.  No response in last 3 calls attempts,unsuccessful letter sent to patient and no response. Case is being closed at this time.   Plan Will mark case as not active,  RN will send MD case closure letter as well as patient.    [name], RN, [place] Management Coordinator  [phone]- Mobile [phone]- Toll Free Main Office

## 2017-11-25 DIAGNOSIS — M25552 Pain in left hip: Secondary | ICD-10-CM | POA: Diagnosis not present

## 2017-11-25 DIAGNOSIS — M25561 Pain in right knee: Secondary | ICD-10-CM | POA: Diagnosis not present

## 2017-12-02 DIAGNOSIS — M25552 Pain in left hip: Secondary | ICD-10-CM | POA: Diagnosis not present

## 2017-12-02 DIAGNOSIS — M25561 Pain in right knee: Secondary | ICD-10-CM | POA: Diagnosis not present

## 2017-12-11 DIAGNOSIS — M25552 Pain in left hip: Secondary | ICD-10-CM | POA: Diagnosis not present

## 2017-12-11 DIAGNOSIS — M25561 Pain in right knee: Secondary | ICD-10-CM | POA: Diagnosis not present

## 2018-01-02 DIAGNOSIS — I10 Essential (primary) hypertension: Secondary | ICD-10-CM | POA: Diagnosis not present

## 2018-01-02 DIAGNOSIS — E782 Mixed hyperlipidemia: Secondary | ICD-10-CM | POA: Diagnosis not present

## 2018-01-07 DIAGNOSIS — G459 Transient cerebral ischemic attack, unspecified: Secondary | ICD-10-CM | POA: Diagnosis not present

## 2018-01-07 DIAGNOSIS — R42 Dizziness and giddiness: Secondary | ICD-10-CM | POA: Diagnosis not present

## 2018-01-09 DIAGNOSIS — Z0000 Encounter for general adult medical examination without abnormal findings: Secondary | ICD-10-CM | POA: Diagnosis not present

## 2018-01-09 DIAGNOSIS — Z8673 Personal history of transient ischemic attack (TIA), and cerebral infarction without residual deficits: Secondary | ICD-10-CM | POA: Diagnosis not present

## 2018-01-09 DIAGNOSIS — E782 Mixed hyperlipidemia: Secondary | ICD-10-CM | POA: Diagnosis not present

## 2018-01-09 DIAGNOSIS — I10 Essential (primary) hypertension: Secondary | ICD-10-CM | POA: Diagnosis not present

## 2018-01-22 DIAGNOSIS — Z8673 Personal history of transient ischemic attack (TIA), and cerebral infarction without residual deficits: Secondary | ICD-10-CM | POA: Diagnosis not present

## 2018-01-22 DIAGNOSIS — E782 Mixed hyperlipidemia: Secondary | ICD-10-CM | POA: Diagnosis not present

## 2018-01-22 DIAGNOSIS — G459 Transient cerebral ischemic attack, unspecified: Secondary | ICD-10-CM | POA: Diagnosis not present

## 2018-04-03 ENCOUNTER — Other Ambulatory Visit: Payer: Self-pay

## 2018-04-03 ENCOUNTER — Emergency Department (HOSPITAL_COMMUNITY): Payer: PPO

## 2018-04-03 ENCOUNTER — Inpatient Hospital Stay (HOSPITAL_COMMUNITY)
Admission: EM | Admit: 2018-04-03 | Discharge: 2018-04-10 | DRG: 982 | Disposition: A | Discharge status: Skilled Nursing Facility | Payer: PPO | Attending: Neurology | Admitting: Neurology

## 2018-04-03 DIAGNOSIS — S6991XA Unspecified injury of right wrist, hand and finger(s), initial encounter: Secondary | ICD-10-CM | POA: Diagnosis not present

## 2018-04-03 DIAGNOSIS — Z419 Encounter for procedure for purposes other than remedying health state, unspecified: Secondary | ICD-10-CM | POA: Diagnosis not present

## 2018-04-03 DIAGNOSIS — I739 Peripheral vascular disease, unspecified: Secondary | ICD-10-CM | POA: Diagnosis not present

## 2018-04-03 DIAGNOSIS — I6300 Cerebral infarction due to thrombosis of unspecified precerebral artery: Secondary | ICD-10-CM | POA: Diagnosis not present

## 2018-04-03 DIAGNOSIS — Z90722 Acquired absence of ovaries, bilateral: Secondary | ICD-10-CM

## 2018-04-03 DIAGNOSIS — M25531 Pain in right wrist: Secondary | ICD-10-CM | POA: Diagnosis not present

## 2018-04-03 DIAGNOSIS — F0390 Unspecified dementia without behavioral disturbance: Secondary | ICD-10-CM | POA: Diagnosis present

## 2018-04-03 DIAGNOSIS — S52501D Unspecified fracture of the lower end of right radius, subsequent encounter for closed fracture with routine healing: Secondary | ICD-10-CM | POA: Diagnosis not present

## 2018-04-03 DIAGNOSIS — G40909 Epilepsy, unspecified, not intractable, without status epilepticus: Secondary | ICD-10-CM

## 2018-04-03 DIAGNOSIS — I4510 Unspecified right bundle-branch block: Secondary | ICD-10-CM | POA: Diagnosis not present

## 2018-04-03 DIAGNOSIS — D72829 Elevated white blood cell count, unspecified: Secondary | ICD-10-CM | POA: Diagnosis present

## 2018-04-03 DIAGNOSIS — Z806 Family history of leukemia: Secondary | ICD-10-CM

## 2018-04-03 DIAGNOSIS — S52501A Unspecified fracture of the lower end of right radius, initial encounter for closed fracture: Secondary | ICD-10-CM | POA: Diagnosis not present

## 2018-04-03 DIAGNOSIS — E785 Hyperlipidemia, unspecified: Secondary | ICD-10-CM | POA: Diagnosis present

## 2018-04-03 DIAGNOSIS — Z8673 Personal history of transient ischemic attack (TIA), and cerebral infarction without residual deficits: Secondary | ICD-10-CM

## 2018-04-03 DIAGNOSIS — Z974 Presence of external hearing-aid: Secondary | ICD-10-CM | POA: Diagnosis not present

## 2018-04-03 DIAGNOSIS — Z823 Family history of stroke: Secondary | ICD-10-CM | POA: Diagnosis not present

## 2018-04-03 DIAGNOSIS — R569 Unspecified convulsions: Secondary | ICD-10-CM | POA: Diagnosis not present

## 2018-04-03 DIAGNOSIS — Z967 Presence of other bone and tendon implants: Secondary | ICD-10-CM | POA: Diagnosis not present

## 2018-04-03 DIAGNOSIS — Z96651 Presence of right artificial knee joint: Secondary | ICD-10-CM | POA: Diagnosis not present

## 2018-04-03 DIAGNOSIS — G8191 Hemiplegia, unspecified affecting right dominant side: Secondary | ICD-10-CM | POA: Diagnosis not present

## 2018-04-03 DIAGNOSIS — M810 Age-related osteoporosis without current pathological fracture: Secondary | ICD-10-CM | POA: Diagnosis present

## 2018-04-03 DIAGNOSIS — Z9841 Cataract extraction status, right eye: Secondary | ICD-10-CM | POA: Diagnosis not present

## 2018-04-03 DIAGNOSIS — K588 Other irritable bowel syndrome: Secondary | ICD-10-CM | POA: Diagnosis not present

## 2018-04-03 DIAGNOSIS — T148XXA Other injury of unspecified body region, initial encounter: Secondary | ICD-10-CM

## 2018-04-03 DIAGNOSIS — W1830XA Fall on same level, unspecified, initial encounter: Secondary | ICD-10-CM | POA: Diagnosis present

## 2018-04-03 DIAGNOSIS — K589 Irritable bowel syndrome without diarrhea: Secondary | ICD-10-CM | POA: Diagnosis not present

## 2018-04-03 DIAGNOSIS — I351 Nonrheumatic aortic (valve) insufficiency: Secondary | ICD-10-CM | POA: Diagnosis not present

## 2018-04-03 DIAGNOSIS — I2510 Atherosclerotic heart disease of native coronary artery without angina pectoris: Secondary | ICD-10-CM | POA: Diagnosis not present

## 2018-04-03 DIAGNOSIS — Z803 Family history of malignant neoplasm of breast: Secondary | ICD-10-CM

## 2018-04-03 DIAGNOSIS — Z85820 Personal history of malignant melanoma of skin: Secondary | ICD-10-CM | POA: Diagnosis not present

## 2018-04-03 DIAGNOSIS — E7800 Pure hypercholesterolemia, unspecified: Secondary | ICD-10-CM | POA: Diagnosis present

## 2018-04-03 DIAGNOSIS — Z961 Presence of intraocular lens: Secondary | ICD-10-CM | POA: Diagnosis present

## 2018-04-03 DIAGNOSIS — R000 Tachycardia, unspecified: Secondary | ICD-10-CM | POA: Diagnosis present

## 2018-04-03 DIAGNOSIS — S92912A Unspecified fracture of left toe(s), initial encounter for closed fracture: Secondary | ICD-10-CM | POA: Diagnosis present

## 2018-04-03 DIAGNOSIS — Z8781 Personal history of (healed) traumatic fracture: Secondary | ICD-10-CM | POA: Diagnosis not present

## 2018-04-03 DIAGNOSIS — R4182 Altered mental status, unspecified: Secondary | ICD-10-CM | POA: Diagnosis not present

## 2018-04-03 DIAGNOSIS — E569 Vitamin deficiency, unspecified: Secondary | ICD-10-CM | POA: Diagnosis not present

## 2018-04-03 DIAGNOSIS — R2990 Unspecified symptoms and signs involving the nervous system: Secondary | ICD-10-CM | POA: Diagnosis present

## 2018-04-03 DIAGNOSIS — S52601A Unspecified fracture of lower end of right ulna, initial encounter for closed fracture: Secondary | ICD-10-CM | POA: Diagnosis not present

## 2018-04-03 DIAGNOSIS — I639 Cerebral infarction, unspecified: Secondary | ICD-10-CM

## 2018-04-03 DIAGNOSIS — I10 Essential (primary) hypertension: Secondary | ICD-10-CM | POA: Diagnosis present

## 2018-04-03 DIAGNOSIS — S52121A Displaced fracture of head of right radius, initial encounter for closed fracture: Secondary | ICD-10-CM | POA: Diagnosis not present

## 2018-04-03 DIAGNOSIS — I708 Atherosclerosis of other arteries: Secondary | ICD-10-CM | POA: Diagnosis present

## 2018-04-03 DIAGNOSIS — Z9842 Cataract extraction status, left eye: Secondary | ICD-10-CM

## 2018-04-03 DIAGNOSIS — R4701 Aphasia: Secondary | ICD-10-CM | POA: Diagnosis not present

## 2018-04-03 DIAGNOSIS — I63233 Cerebral infarction due to unspecified occlusion or stenosis of bilateral carotid arteries: Secondary | ICD-10-CM | POA: Diagnosis not present

## 2018-04-03 DIAGNOSIS — R52 Pain, unspecified: Secondary | ICD-10-CM | POA: Diagnosis not present

## 2018-04-03 DIAGNOSIS — H9190 Unspecified hearing loss, unspecified ear: Secondary | ICD-10-CM | POA: Diagnosis present

## 2018-04-03 DIAGNOSIS — Z9889 Other specified postprocedural states: Secondary | ICD-10-CM

## 2018-04-03 DIAGNOSIS — R4789 Other speech disturbances: Secondary | ICD-10-CM | POA: Diagnosis not present

## 2018-04-03 DIAGNOSIS — T148XXS Other injury of unspecified body region, sequela: Secondary | ICD-10-CM | POA: Diagnosis not present

## 2018-04-03 DIAGNOSIS — E8889 Other specified metabolic disorders: Secondary | ICD-10-CM | POA: Diagnosis present

## 2018-04-03 DIAGNOSIS — E876 Hypokalemia: Secondary | ICD-10-CM | POA: Diagnosis not present

## 2018-04-03 DIAGNOSIS — Z4789 Encounter for other orthopedic aftercare: Secondary | ICD-10-CM | POA: Diagnosis not present

## 2018-04-03 DIAGNOSIS — G319 Degenerative disease of nervous system, unspecified: Secondary | ICD-10-CM | POA: Diagnosis present

## 2018-04-03 LAB — COMPREHENSIVE METABOLIC PANEL
ALBUMIN: 3.9 g/dL (ref 3.5–5.0)
ALT: 11 U/L (ref 0–44)
ANION GAP: 12 (ref 5–15)
AST: 22 U/L (ref 15–41)
Alkaline Phosphatase: 69 U/L (ref 38–126)
BUN: 13 mg/dL (ref 8–23)
CO2: 20 mmol/L — ABNORMAL LOW (ref 22–32)
Calcium: 10 mg/dL (ref 8.9–10.3)
Chloride: 101 mmol/L (ref 98–111)
Creatinine, Ser: 0.91 mg/dL (ref 0.44–1.00)
GFR calc Af Amer: >60 mL/min (ref >60)
GFR calc non Af Amer: 56 mL/min — ABNORMAL LOW (ref >60)
GLUCOSE: 133 mg/dL — AB (ref 70–99)
POTASSIUM: 3.6 mmol/L (ref 3.5–5.1)
Sodium: 133 mmol/L — ABNORMAL LOW (ref 135–145)
Total Bilirubin: 1 mg/dL (ref 0.3–1.2)
Total Protein: 6.1 g/dL — ABNORMAL LOW (ref 6.5–8.1)

## 2018-04-03 LAB — I-STAT CHEM 8, ED
BUN: 15 mg/dL (ref 8–23)
CREATININE: 0.8 mg/dL (ref 0.44–1.00)
Calcium, Ion: 1.25 mmol/L (ref 1.15–1.40)
Chloride: 103 mmol/L (ref 98–111)
Glucose, Bld: 136 mg/dL — ABNORMAL HIGH (ref 70–99)
HEMATOCRIT: 41 % (ref 36.0–46.0)
HEMOGLOBIN: 13.9 g/dL (ref 12.0–15.0)
POTASSIUM: 3.9 mmol/L (ref 3.5–5.1)
SODIUM: 133 mmol/L — AB (ref 135–145)
TCO2: 21 mmol/L — ABNORMAL LOW (ref 22–32)

## 2018-04-03 LAB — PROTIME-INR
INR: 1.04
PROTHROMBIN TIME: 13.5 s (ref 11.4–15.2)

## 2018-04-03 LAB — CBC
HCT: 40.4 % (ref 36.0–46.0)
Hemoglobin: 13.2 g/dL (ref 12.0–15.0)
MCH: 30.4 pg (ref 26.0–34.0)
MCHC: 32.7 g/dL (ref 30.0–36.0)
MCV: 93.1 fL (ref 78.0–100.0)
PLATELETS: 249 10*3/uL (ref 150–400)
RBC: 4.34 MIL/uL (ref 3.87–5.11)
RDW: 12.9 % (ref 11.5–15.5)
WBC: 13.9 10*3/uL — AB (ref 4.0–10.5)

## 2018-04-03 LAB — DIFFERENTIAL
ABS IMMATURE GRANULOCYTES: 0.1 10*3/uL (ref 0.0–0.1)
BASOS PCT: 0 %
Basophils Absolute: 0.1 10*3/uL (ref 0.0–0.1)
EOS ABS: 0.2 10*3/uL (ref 0.0–0.7)
Eosinophils Relative: 1 %
IMMATURE GRANULOCYTES: 1 %
Lymphocytes Relative: 6 %
Lymphs Abs: 0.9 10*3/uL (ref 0.7–4.0)
Monocytes Absolute: 0.8 10*3/uL (ref 0.1–1.0)
Monocytes Relative: 6 %
NEUTROS PCT: 86 %
Neutro Abs: 11.9 10*3/uL — ABNORMAL HIGH (ref 1.7–7.7)

## 2018-04-03 LAB — I-STAT TROPONIN, ED: Troponin i, poc: 0 ng/mL (ref 0.00–0.08)

## 2018-04-03 LAB — APTT: APTT: 27 s (ref 24–36)

## 2018-04-03 MED ORDER — LABETALOL HCL 5 MG/ML IV SOLN
20.0000 mg | Freq: Once | INTRAVENOUS | Status: AC
  Administered 2018-04-03: 20 mg via INTRAVENOUS

## 2018-04-03 MED ORDER — LISINOPRIL 20 MG PO TABS
20.0000 mg | ORAL_TABLET | Freq: Two times a day (BID) | ORAL | Status: DC
  Administered 2018-04-04 – 2018-04-10 (×13): 20 mg via ORAL
  Filled 2018-04-03 (×13): qty 1

## 2018-04-03 MED ORDER — SODIUM CHLORIDE 0.9 % IV SOLN
INTRAVENOUS | Status: DC
  Administered 2018-04-03 – 2018-04-05 (×3): via INTRAVENOUS

## 2018-04-03 MED ORDER — ACETAMINOPHEN 650 MG RE SUPP: 650.0000 mg | RECTAL | Status: DC | PRN

## 2018-04-03 MED ORDER — ACETAMINOPHEN 160 MG/5ML PO SOLN: 650.0000 mg | ORAL | Status: DC | PRN

## 2018-04-03 MED ORDER — LABETALOL HCL 5 MG/ML IV SOLN
20.0000 mg | Freq: Once | INTRAVENOUS | Status: AC
  Administered 2018-04-04: 20 mg via INTRAVENOUS
  Filled 2018-04-03: qty 4

## 2018-04-03 MED ORDER — IOPAMIDOL (ISOVUE-370) INJECTION 76%
INTRAVENOUS | Status: AC
  Filled 2018-04-03: qty 100

## 2018-04-03 MED ORDER — NICARDIPINE HCL IN NACL 20-0.86 MG/200ML-% IV SOLN: 0.0000 mg/h | INTRAVENOUS | Status: DC

## 2018-04-03 MED ORDER — STROKE: EARLY STAGES OF RECOVERY BOOK
Freq: Once | Status: AC
  Administered 2018-04-08: 13:00:00
  Filled 2018-04-03: qty 1

## 2018-04-03 MED ORDER — FAMOTIDINE IN NACL 20-0.9 MG/50ML-% IV SOLN
20.0000 mg | Freq: Two times a day (BID) | INTRAVENOUS | Status: DC
  Administered 2018-04-03 – 2018-04-04 (×3): 20 mg via INTRAVENOUS
  Filled 2018-04-03 (×3): qty 50

## 2018-04-03 MED ORDER — IOPAMIDOL (ISOVUE-370) INJECTION 76%
100.0000 mL | Freq: Once | INTRAVENOUS | Status: AC | PRN
  Administered 2018-04-03: 100 mL via INTRAVENOUS

## 2018-04-03 MED ORDER — ACETAMINOPHEN 325 MG PO TABS
650.0000 mg | ORAL_TABLET | ORAL | Status: DC | PRN
  Administered 2018-04-04 – 2018-04-07 (×8): 650 mg via ORAL
  Filled 2018-04-03 (×8): qty 2

## 2018-04-03 MED ORDER — PRAVASTATIN SODIUM 20 MG PO TABS
20.0000 mg | ORAL_TABLET | Freq: Every day | ORAL | Status: DC
  Administered 2018-04-04 – 2018-04-09 (×6): 20 mg via ORAL
  Filled 2018-04-03 (×6): qty 1

## 2018-04-03 MED ORDER — AMLODIPINE BESYLATE 5 MG PO TABS
5.0000 mg | ORAL_TABLET | Freq: Every day | ORAL | Status: DC
  Administered 2018-04-04 – 2018-04-09 (×6): 5 mg via ORAL
  Filled 2018-04-03 (×6): qty 1

## 2018-04-03 MED ORDER — SODIUM CHLORIDE 0.9 % IV SOLN
50.0000 mL | Freq: Once | INTRAVENOUS | Status: AC
  Administered 2018-04-03: 50 mL via INTRAVENOUS

## 2018-04-03 MED ORDER — ALTEPLASE (STROKE) FULL DOSE INFUSION
0.9000 mg/kg | Freq: Once | INTRAVENOUS | Status: AC
  Administered 2018-04-03: 40.2 mg via INTRAVENOUS

## 2018-04-03 MED ORDER — LORAZEPAM 2 MG/ML IJ SOLN
INTRAMUSCULAR | Status: AC
  Administered 2018-04-03: 2 mg
  Filled 2018-04-03: qty 1

## 2018-04-03 MED ORDER — SENNOSIDES-DOCUSATE SODIUM 8.6-50 MG PO TABS
1.0000 | ORAL_TABLET | Freq: Every evening | ORAL | Status: DC | PRN
  Administered 2018-04-06: 1 via ORAL
  Filled 2018-04-03 (×2): qty 1

## 2018-04-03 MED ORDER — GABAPENTIN 100 MG PO CAPS
100.0000 mg | ORAL_CAPSULE | Freq: Three times a day (TID) | ORAL | Status: DC
  Administered 2018-04-04 – 2018-04-10 (×20): 100 mg via ORAL
  Filled 2018-04-03 (×19): qty 1

## 2018-04-03 NOTE — ED Notes (Signed)
Ortho placed splint to right wrist.

## 2018-04-03 NOTE — ED Notes (Signed)
Neurologist Lindzen paged for bed request again.

## 2018-04-03 NOTE — ED Provider Notes (Signed)
Emergency Department Provider Note   I have reviewed the triage vital signs and the nursing notes.   HISTORY  Chief Complaint Code Stroke   HPI Kellie Harris is a 84 y.o. female who is aphasic not able to offer history but reportedly had a last known normal of approximately 6 team 39 when she had acute onset of aphasia and right-sided weakness.  She fell landing on her right arm with right arm deformity.  Splint by EMS brought here for further evaluation.  She was little hypertensive on the way here. No other associated or modifying symptoms.   LEVEL V Caveat Secondary to Aphasia  Past Medical History:  Diagnosis Date  . Arthritis    right knee, left shoulder/ OSTEO  . Cancer ([place])    skin   . Degenerative disc disease, lumbar    L4-L5  . Diverticulosis    ITIS  . History of hiatal hernia   . Hydronephrosis    right  . Hypercholesteremia   . Hypertension    CONTROLLED ON MEDS  . IBS (irritable bowel syndrome)   . Knee fracture, right 03/05/2015   DIFFICULTY WITH AMBULATION  . Osteopenia   . Ovarian cyst   . Pancreatic insufficiency   . Wears hearing aid    bilateral    Patient Active Problem List   Diagnosis Date Noted  . Stroke (cerebrum) ([place]) 04/03/2018  . Syncope   . TIA (transient ischemic attack) 11/04/2017  . Malnutrition of moderate degree 08/26/2017  . Total knee replacement status 08/07/2017  . Closed left hip fracture ([place]) 02/12/2017  . HTN (hypertension) 02/12/2017  . HLD (hyperlipidemia) 02/12/2017    Past Surgical History:  Procedure Laterality Date  . CATARACT EXTRACTION W/PHACO Right 11/02/2015   Procedure: CATARACT EXTRACTION PHACO AND INTRAOCULAR LENS PLACEMENT (IOC);  Surgeon: Leandrew Koyanagi, MD;  Location: [place];  Service: Ophthalmology;  Laterality: Right;  . CATARACT EXTRACTION W/PHACO Left 07/18/2016   Procedure: CATARACT EXTRACTION PHACO AND INTRAOCULAR LENS PLACEMENT (IOC);  Surgeon: Leandrew Koyanagi, MD;   Location: [place];  Service: Ophthalmology;  Laterality: Left;  . COLONOSCOPY    . FRACTURE SURGERY Left    hip  . INTRAMEDULLARY (IM) NAIL INTERTROCHANTERIC Left 02/12/2017   Procedure: INTRAMEDULLARY (IM) NAIL INTERTROCHANTRIC;  Surgeon: Earnestine Leys, MD;  Location: [facility];  Service: Orthopedics;  Laterality: Left;  . kidney stent    . OOPHORECTOMY Bilateral 2015  . TONSILLECTOMY    . TOTAL KNEE ARTHROPLASTY Right 08/07/2017   Procedure: TOTAL KNEE ARTHROPLASTY;  Surgeon: Earnestine Leys, MD;  Location: [facility];  Service: Orthopedics;  Laterality: Right;      Allergies Patient has no known allergies.  Family History  Problem Relation Age of Onset  . Breast cancer Mother 70  . Leukemia Mother   . Stroke Father     Social History Social History   Tobacco Use  . Smoking status: Never Smoker  . Smokeless tobacco: Never Used  Substance Use Topics  . Alcohol use: No  . Drug use: No    Review of Systems  LEVEL V Caveat Secondary to Aphasia ____________________________________________   PHYSICAL EXAM:  VITAL SIGNS: Blood pressure (!) 153/77, pulse 77, temperature 98 F (36.7 C), temperature source Oral, resp. rate 17, height 4\' 11"  (1.499 m), weight 44.7 kg, SpO2 100 %.   Constitutional: Alert and oriented. Well appearing and in no acute distress. Eyes: Conjunctivae are normal. PERRL. EOMI. Head: Atraumatic. Nose: No congestion/rhinnorhea. Mouth/Throat: Mucous membranes  are moist.  Oropharynx non-erythematous. Neck: No stridor.  No meningeal signs.   Cardiovascular: Normal rate, regular rhythm. Good peripheral circulation. Grossly normal heart sounds.   Respiratory: Normal respiratory effort.  No retractions. Lungs CTAB. Gastrointestinal: Soft and nontender. No distention.  Musculoskeletal: R distal arm deformity.  Neurologic:  No speech. Limited exam to acuity of situation. R sided deficits. Skin:  Skin is warm, Blatter and intact. No rash  noted.  ____________________________________________   LABS (all labs ordered are listed, but only abnormal results are displayed)  Labs Reviewed  CBC - Abnormal; Notable for the following components:      Result Value   WBC 13.9 (*)    All other components within normal limits  DIFFERENTIAL - Abnormal; Notable for the following components:   Neutro Abs 11.9 (*)    All other components within normal limits  COMPREHENSIVE METABOLIC PANEL - Abnormal; Notable for the following components:   Sodium 133 (*)    CO2 20 (*)    Glucose, Bld 133 (*)    Total Protein 6.1 (*)    GFR calc non Af Amer 56 (*)    All other components within normal limits  I-STAT CHEM 8, ED - Abnormal; Notable for the following components:   Sodium 133 (*)    Glucose, Bld 136 (*)    TCO2 21 (*)    All other components within normal limits  MRSA PCR SCREENING  PROTIME-INR  APTT  HEMOGLOBIN A1C  LIPID PANEL  I-STAT TROPONIN, ED  CBG MONITORING, ED   ____________________________________________  EKG   EKG Interpretation  Date/Time:  Thursday April 03 2018 18:58:16 EDT Ventricular Rate:  78 PR Interval:    QRS Duration: 140 QT Interval:  423 QTC Calculation: 482 R Axis:   26 Text Interpretation:  Sinus rhythm Ventricular premature complex IVCD, consider atypical RBBB slower rate, no other changes Confirmed by Merrily Pew [phone]) on 04/04/2018 12:33:01 AM       ____________________________________________  RADIOLOGY  Ct Angio Head W Or Wo Contrast  Result Date: 04/03/2018 CLINICAL DATA:  Initial evaluation for acute stroke. Right-sided weakness. EXAM: CT ANGIOGRAPHY HEAD AND NECK CT PERFUSION BRAIN TECHNIQUE: Multidetector CT imaging of the head and neck was performed using the standard protocol during bolus administration of intravenous contrast. Multiplanar CT image reconstructions and MIPs were obtained to evaluate the vascular anatomy. Carotid stenosis measurements (when applicable) are  obtained utilizing NASCET criteria, using the distal internal carotid diameter as the denominator. Multiphase CT imaging of the brain was performed following IV bolus contrast injection. Subsequent parametric perfusion maps were calculated using RAPID software. CONTRAST:  164mL ISOVUE-370 IOPAMIDOL (ISOVUE-370) INJECTION 76%, <See Chart> ISOVUE-370 IOPAMIDOL (ISOVUE-370) INJECTION 76% COMPARISON:  Prior study from 11/04/2017 FINDINGS: CTA NECK FINDINGS Aortic arch: Visualized aortic arch of normal caliber with normal branch pattern. Scattered atheromatous plaque within the arch and about the visualized great vessels without hemodynamically significant stenosis. Visualized subclavian arteries widely patent. Right carotid system: Right common carotid artery tortuous proximally but widely patent to the bifurcation. Minimal atheromatous plaque about the right bifurcation without significant stenosis. Right ICA widely patent distally to the skull base without stenosis, dissection, or occlusion. Left carotid system: Left common carotid artery tortuous proximally but widely patent to the bifurcation without stenosis. Eccentric calcified plaque about the left bifurcation without hemodynamically significant stenosis. Left ICA patent distally to the skull base without stenosis, dissection, or occlusion. Vertebral arteries: Both of the vertebral arteries arise from the subclavian arteries. Right vertebral  artery dominant. Vertebral arteries tortuous proximally but are widely patent within the neck without stenosis, dissection, or occlusion. Skeleton: No acute osseous abnormality. No discrete lytic or blastic osseous lesions. Mild to moderate cervical spondylolysis at C3-4 through C6-7. Other neck: No acute soft tissue abnormality within the neck. Chronic left maxillary sinusitis noted. Upper chest: Visualized upper chest within normal limits. Partially visualized lungs are grossly clear. Review of the MIP images confirms the  above findings CTA HEAD FINDINGS Anterior circulation: Petrous segments widely patent bilaterally. Mild scattered atheromatous plaque throughout the cavernous/supraclinoid ICAs without hemodynamically significant stenosis. ICA termini widely patent. A1 segments widely patent. Normal anterior communicating artery. Anterior cerebral arteries widely patent to their distal aspects. Left M1 widely patent. No proximal left M2 occlusion distal left MCA branches well perfused. Right M1 widely patent to its distal aspect. Subtle 3 mm focal outpouching arising from the distal right M1 segment again seen, suspicious for small aneurysm versus infundibulum (series 7, image 105). No proximal right M2 occlusion. Distal right MCA branches well perfused. Posterior circulation: Atheromatous plaque within the V4 segments bilaterally with secondary mild to moderate stenoses, stable from previous. Right before dominant. Posterior inferior cerebral arteries patent bilaterally. Basilar artery widely patent to its distal aspect without stenosis. Superior cerebral arteries patent bilaterally. Both of the posterior cerebral arteries primarily supplied via the basilar and are well perfused to their distal aspects. Venous sinuses: Grossly patent. Anatomic variants: None significant. Delayed phase: Not performed. Review of the MIP images confirms the above findings CT Brain Perfusion Findings: CT perfusion failed on this exam as no suitable AIF location was determined. Findings suspected to be related to motion artifact from the patient on this portion of the exam. IMPRESSION: 1. Negative CTA for large vessel occlusion. 2. Failed and nondiagnostic CT perfusion. 3. atheromatous change involving the major arterial vasculature of the head and neck, stable relative to previous CTA from 11/04/2017. Changes most notable within the bilateral V4 segments were there are mild to moderate stenoses. No other hemodynamically significant or correctable  stenosis identified. 4. Stable 3 mm focal outpouching arising from the distal right M1 segment, possibly reflecting a small aneurysm versus vascular infundibulum. 5. Tortuosity of the major arterial vasculature within the neck, suggesting chronic underlying hypertension. These results were communicated to Dr. Cheral Marker at [place] 8/22/2019by text page via the [facility] messaging system. Electronically Signed   By: Jeannine Boga M.D.   On: 04/03/2018 19:19   Dg Forearm Right  Result Date: 04/03/2018 CLINICAL DATA:  84 y/o  F; fall with pain. EXAM: RIGHT FOREARM - 2 VIEW COMPARISON:  None. FINDINGS: Acute impacted and minimally displaced fractures of the distal radius and ulna just proximal to the wrist joint. Elbow and wrist joints are maintained. Bones are demineralized. IMPRESSION: Acute impacted and minimally displaced fractures of the distal radius and ulna just proximal to the wrist joint. Electronically Signed   By: Kristine Garbe M.D.   On: 04/03/2018 19:44   Ct Angio Neck W Or Wo Contrast  Result Date: 04/03/2018 CLINICAL DATA:  Initial evaluation for acute stroke. Right-sided weakness. EXAM: CT ANGIOGRAPHY HEAD AND NECK CT PERFUSION BRAIN TECHNIQUE: Multidetector CT imaging of the head and neck was performed using the standard protocol during bolus administration of intravenous contrast. Multiplanar CT image reconstructions and MIPs were obtained to evaluate the vascular anatomy. Carotid stenosis measurements (when applicable) are obtained utilizing NASCET criteria, using the distal internal carotid diameter as the denominator. Multiphase CT imaging of the brain  was performed following IV bolus contrast injection. Subsequent parametric perfusion maps were calculated using RAPID software. CONTRAST:  161mL ISOVUE-370 IOPAMIDOL (ISOVUE-370) INJECTION 76%, <See Chart> ISOVUE-370 IOPAMIDOL (ISOVUE-370) INJECTION 76% COMPARISON:  Prior study from 11/04/2017 FINDINGS: CTA NECK FINDINGS Aortic  arch: Visualized aortic arch of normal caliber with normal branch pattern. Scattered atheromatous plaque within the arch and about the visualized great vessels without hemodynamically significant stenosis. Visualized subclavian arteries widely patent. Right carotid system: Right common carotid artery tortuous proximally but widely patent to the bifurcation. Minimal atheromatous plaque about the right bifurcation without significant stenosis. Right ICA widely patent distally to the skull base without stenosis, dissection, or occlusion. Left carotid system: Left common carotid artery tortuous proximally but widely patent to the bifurcation without stenosis. Eccentric calcified plaque about the left bifurcation without hemodynamically significant stenosis. Left ICA patent distally to the skull base without stenosis, dissection, or occlusion. Vertebral arteries: Both of the vertebral arteries arise from the subclavian arteries. Right vertebral artery dominant. Vertebral arteries tortuous proximally but are widely patent within the neck without stenosis, dissection, or occlusion. Skeleton: No acute osseous abnormality. No discrete lytic or blastic osseous lesions. Mild to moderate cervical spondylolysis at C3-4 through C6-7. Other neck: No acute soft tissue abnormality within the neck. Chronic left maxillary sinusitis noted. Upper chest: Visualized upper chest within normal limits. Partially visualized lungs are grossly clear. Review of the MIP images confirms the above findings CTA HEAD FINDINGS Anterior circulation: Petrous segments widely patent bilaterally. Mild scattered atheromatous plaque throughout the cavernous/supraclinoid ICAs without hemodynamically significant stenosis. ICA termini widely patent. A1 segments widely patent. Normal anterior communicating artery. Anterior cerebral arteries widely patent to their distal aspects. Left M1 widely patent. No proximal left M2 occlusion distal left MCA branches well  perfused. Right M1 widely patent to its distal aspect. Subtle 3 mm focal outpouching arising from the distal right M1 segment again seen, suspicious for small aneurysm versus infundibulum (series 7, image 105). No proximal right M2 occlusion. Distal right MCA branches well perfused. Posterior circulation: Atheromatous plaque within the V4 segments bilaterally with secondary mild to moderate stenoses, stable from previous. Right before dominant. Posterior inferior cerebral arteries patent bilaterally. Basilar artery widely patent to its distal aspect without stenosis. Superior cerebral arteries patent bilaterally. Both of the posterior cerebral arteries primarily supplied via the basilar and are well perfused to their distal aspects. Venous sinuses: Grossly patent. Anatomic variants: None significant. Delayed phase: Not performed. Review of the MIP images confirms the above findings CT Brain Perfusion Findings: CT perfusion failed on this exam as no suitable AIF location was determined. Findings suspected to be related to motion artifact from the patient on this portion of the exam. IMPRESSION: 1. Negative CTA for large vessel occlusion. 2. Failed and nondiagnostic CT perfusion. 3. atheromatous change involving the major arterial vasculature of the head and neck, stable relative to previous CTA from 11/04/2017. Changes most notable within the bilateral V4 segments were there are mild to moderate stenoses. No other hemodynamically significant or correctable stenosis identified. 4. Stable 3 mm focal outpouching arising from the distal right M1 segment, possibly reflecting a small aneurysm versus vascular infundibulum. 5. Tortuosity of the major arterial vasculature within the neck, suggesting chronic underlying hypertension. These results were communicated to Dr. Cheral Marker at [place] 8/22/2019by text page via the [facility] messaging system. Electronically Signed   By: Jeannine Boga M.D.   On: 04/03/2018 19:19   Ct  Cerebral Perfusion W Contrast  Result Date: 04/03/2018  CLINICAL DATA:  Initial evaluation for acute stroke. Right-sided weakness. EXAM: CT ANGIOGRAPHY HEAD AND NECK CT PERFUSION BRAIN TECHNIQUE: Multidetector CT imaging of the head and neck was performed using the standard protocol during bolus administration of intravenous contrast. Multiplanar CT image reconstructions and MIPs were obtained to evaluate the vascular anatomy. Carotid stenosis measurements (when applicable) are obtained utilizing NASCET criteria, using the distal internal carotid diameter as the denominator. Multiphase CT imaging of the brain was performed following IV bolus contrast injection. Subsequent parametric perfusion maps were calculated using RAPID software. CONTRAST:  162mL ISOVUE-370 IOPAMIDOL (ISOVUE-370) INJECTION 76%, <See Chart> ISOVUE-370 IOPAMIDOL (ISOVUE-370) INJECTION 76% COMPARISON:  Prior study from 11/04/2017 FINDINGS: CTA NECK FINDINGS Aortic arch: Visualized aortic arch of normal caliber with normal branch pattern. Scattered atheromatous plaque within the arch and about the visualized great vessels without hemodynamically significant stenosis. Visualized subclavian arteries widely patent. Right carotid system: Right common carotid artery tortuous proximally but widely patent to the bifurcation. Minimal atheromatous plaque about the right bifurcation without significant stenosis. Right ICA widely patent distally to the skull base without stenosis, dissection, or occlusion. Left carotid system: Left common carotid artery tortuous proximally but widely patent to the bifurcation without stenosis. Eccentric calcified plaque about the left bifurcation without hemodynamically significant stenosis. Left ICA patent distally to the skull base without stenosis, dissection, or occlusion. Vertebral arteries: Both of the vertebral arteries arise from the subclavian arteries. Right vertebral artery dominant. Vertebral arteries tortuous  proximally but are widely patent within the neck without stenosis, dissection, or occlusion. Skeleton: No acute osseous abnormality. No discrete lytic or blastic osseous lesions. Mild to moderate cervical spondylolysis at C3-4 through C6-7. Other neck: No acute soft tissue abnormality within the neck. Chronic left maxillary sinusitis noted. Upper chest: Visualized upper chest within normal limits. Partially visualized lungs are grossly clear. Review of the MIP images confirms the above findings CTA HEAD FINDINGS Anterior circulation: Petrous segments widely patent bilaterally. Mild scattered atheromatous plaque throughout the cavernous/supraclinoid ICAs without hemodynamically significant stenosis. ICA termini widely patent. A1 segments widely patent. Normal anterior communicating artery. Anterior cerebral arteries widely patent to their distal aspects. Left M1 widely patent. No proximal left M2 occlusion distal left MCA branches well perfused. Right M1 widely patent to its distal aspect. Subtle 3 mm focal outpouching arising from the distal right M1 segment again seen, suspicious for small aneurysm versus infundibulum (series 7, image 105). No proximal right M2 occlusion. Distal right MCA branches well perfused. Posterior circulation: Atheromatous plaque within the V4 segments bilaterally with secondary mild to moderate stenoses, stable from previous. Right before dominant. Posterior inferior cerebral arteries patent bilaterally. Basilar artery widely patent to its distal aspect without stenosis. Superior cerebral arteries patent bilaterally. Both of the posterior cerebral arteries primarily supplied via the basilar and are well perfused to their distal aspects. Venous sinuses: Grossly patent. Anatomic variants: None significant. Delayed phase: Not performed. Review of the MIP images confirms the above findings CT Brain Perfusion Findings: CT perfusion failed on this exam as no suitable AIF location was determined.  Findings suspected to be related to motion artifact from the patient on this portion of the exam. IMPRESSION: 1. Negative CTA for large vessel occlusion. 2. Failed and nondiagnostic CT perfusion. 3. atheromatous change involving the major arterial vasculature of the head and neck, stable relative to previous CTA from 11/04/2017. Changes most notable within the bilateral V4 segments were there are mild to moderate stenoses. No other hemodynamically significant or correctable stenosis identified. 4.  Stable 3 mm focal outpouching arising from the distal right M1 segment, possibly reflecting a small aneurysm versus vascular infundibulum. 5. Tortuosity of the major arterial vasculature within the neck, suggesting chronic underlying hypertension. These results were communicated to Dr. Cheral Marker at [place] 8/22/2019by text page via the [facility] messaging system. Electronically Signed   By: Jeannine Boga M.D.   On: 04/03/2018 19:19   Ct Head Code Stroke Wo Contrast  Result Date: 04/03/2018 CLINICAL DATA:  Code stroke. Initial evaluation for acute right-sided weakness, speech difficulty. EXAM: CT HEAD WITHOUT CONTRAST TECHNIQUE: Contiguous axial images were obtained from the base of the skull through the vertex without intravenous contrast. COMPARISON:  Prior CT from 11/04/2017 and MRI from 11/05/2017. FINDINGS: Brain: Advanced cerebral atrophy with chronic small vessel ischemic disease, stable. Remote lacunar infarcts present within the hemispheric right cerebral white matter and right thalamus. Few scatter remote bilateral cerebellar infarcts. Small remote left PCA territory infarct. No acute intracranial hemorrhage. No acute large vessel territory infarct. No mass lesion, midline shift or mass effect. No hydrocephalus. Vascular: No hyperdense vessel. Calcified atherosclerosis at the skull base. Skull: Scalp soft tissues and calvarium demonstrate no acute finding. Sinuses/Orbits: Globes and orbital soft tissues  within normal limits. Chronic left maxillary sinusitis partially visualized. Paranasal sinuses and mastoid air cells are otherwise clear. Other: None. ASPECTS [facility] Stroke Program Early CT Score) - Ganglionic level infarction (caudate, lentiform nuclei, internal capsule, insula, M1-M3 cortex): 7 - Supraganglionic infarction (M4-M6 cortex): 3 Total score (0-10 with 10 being normal): 10 IMPRESSION: 1. No acute intracranial infarct or other abnormality. 2. ASPECTS is 10. 3. Atrophy with extensive chronic ischemic changes as above, stable from previous. These results were communicated to Dr. Cheral Marker at 6:26 pmon 8/22/2019by text page via the [facility] messaging system. Electronically Signed   By: Jeannine Boga M.D.   On: 04/03/2018 18:30    ____________________________________________   PROCEDURES  Procedure(s) performed:   Procedures  CRITICAL CARE Performed by: Merrily Pew Total critical care time: 35 minutes Critical care time was exclusive of separately billable procedures and treating other patients. Critical care was necessary to treat or prevent imminent or life-threatening deterioration. Critical care was time spent personally by me on the following activities: development of treatment plan with patient and/or surrogate as well as nursing, discussions with consultants, evaluation of patient's response to treatment, examination of patient, obtaining history from patient or surrogate, ordering and performing treatments and interventions, ordering and review of laboratory studies, ordering and review of radiographic studies, pulse oximetry and re-evaluation of patient's condition.  ____________________________________________   INITIAL IMPRESSION / ASSESSMENT AND PLAN / ED COURSE  Code stroke, still hypertensive, secondary control was given TPA.  No evidence of head bleed prior to admission to the hospital.  Also found to have a right distal radius ulna fracture.  Pretty close  anatomic alignment.  This was considered before TPA with the hematoma and will just need to be monitored to make sure is not expanding.  Splint placed.  Discussed with Dr. handy with orthopedics who will see patient in morning but agrees with care at this point.    Pertinent labs & imaging results that were available during my care of the patient were reviewed by me and considered in my medical decision making (see chart for details).  ____________________________________________  FINAL CLINICAL IMPRESSION(S) / ED DIAGNOSES  Final diagnoses:  Cerebrovascular accident (CVA), unspecified mechanism ([place])     MEDICATIONS GIVEN DURING THIS VISIT:  Medications  iopamidol (ISOVUE-370)  76 % injection (has no administration in time range)   stroke: mapping our early stages of recovery book (has no administration in time range)  0.9 %  sodium chloride infusion ( Intravenous New Bag/Given 04/03/18 2352)  acetaminophen (TYLENOL) tablet 650 mg (has no administration in time range)    Or  acetaminophen (TYLENOL) solution 650 mg (has no administration in time range)    Or  acetaminophen (TYLENOL) suppository 650 mg (has no administration in time range)  senna-docusate (Senokot-S) tablet 1 tablet (has no administration in time range)  famotidine (PEPCID) IVPB 20 mg premix (20 mg Intravenous New Bag/Given 04/03/18 2353)  labetalol (NORMODYNE,TRANDATE) injection 20 mg (has no administration in time range)    And  nicardipine (CARDENE) 20mg  in 0.86% saline 229ml IV infusion (0.1 mg/ml) (has no administration in time range)  amLODipine (NORVASC) tablet 5 mg (has no administration in time range)  gabapentin (NEURONTIN) capsule 100 mg (has no administration in time range)  lisinopril (PRINIVIL,ZESTRIL) tablet 20 mg (has no administration in time range)  pravastatin (PRAVACHOL) tablet 20 mg (has no administration in time range)  LORazepam (ATIVAN) 2 MG/ML injection (2 mg  Given 04/03/18 1800)  LORazepam  (ATIVAN) 2 MG/ML injection (2 mg  Given 04/03/18 1830)  LORazepam (ATIVAN) 2 MG/ML injection (2 mg  Given 04/03/18 1845)  iopamidol (ISOVUE-370) 76 % injection 100 mL (100 mLs Intravenous Contrast Given 04/03/18 1838)  alteplase (ACTIVASE) 1 mg/mL infusion 40.2 mg (0 mg/kg  44.7 kg Intravenous Stopped 04/03/18 1923)    Followed by  0.9 %  sodium chloride infusion (50 mLs Intravenous New Bag/Given 04/03/18 1952)  labetalol (NORMODYNE,TRANDATE) injection 20 mg (20 mg Intravenous Given 04/03/18 1820)     NEW OUTPATIENT MEDICATIONS STARTED DURING THIS VISIT:  Current Discharge Medication List      Note:  This note was prepared with assistance of Dragon voice recognition software. Occasional wrong-word or sound-a-like substitutions may have occurred due to the inherent limitations of voice recognition software.  Don Giarrusso, Corene Cornea, MD 04/04/18 [phone]

## 2018-04-03 NOTE — ED Notes (Signed)
TPA initiated with assistance from Pharmacy

## 2018-04-03 NOTE — Progress Notes (Signed)
Ortho Consult Received  DX: right distal radius and ulna fractures. Admitted for devastating neurological injury.  Full consult to follow in am. Continue sugar tong splint, ice, and elevation.  Altamese Upton, MD [facility], [facility] [phone]

## 2018-04-03 NOTE — ED Notes (Signed)
Pt placed on oxygen 2 liters [place] for sats of 91%

## 2018-04-03 NOTE — ED Notes (Signed)
Pt given 20 mg of Labetalol. Pharmacy verified, Neurologist gave verbal order

## 2018-04-03 NOTE — Progress Notes (Signed)
Pharmacist Code Stroke Response  Notified to mix tPA at 1808 by Dr. Cheral Marker Delivered tPA to RN at 1815  Issues/delays encountered (if applicable): pending EDP eval for wrist fx bp elevated needing treatment   Levester Fresh, PharmD, BCPS, BCCCP Clinical Pharmacist [phone]  Please check AMION for all [place] numbers  04/03/2018 6:26 PM

## 2018-04-03 NOTE — ED Notes (Signed)
Attempted report 

## 2018-04-03 NOTE — Consult Note (Addendum)
Referring Physician: Dr. Dayna Barker    Chief Complaint: Acute onset of aphasia and right sided weakness  HPI: Kellie Harris is an 84 y.o. female presenting via EMS after sudden onset of expressive aphasia with right sided weakness resulting in a fall from a standing position onto her right wrist.  LKN was 1645. BP per EMS was 161/75, HR 85 and CBG 157. She has a history of TIAs with recent stroke work up completed in April, per daughter, which was inconclusive. She was started on Plavix at that time. She is not on a blood thinner.  Daughter reports no bleeding problems, history of intracranial hemorrhage or recent operations.  LSN: [id number] tPA Given: Yes  Past Medical History:  Diagnosis Date  . Arthritis    right knee, left shoulder/ OSTEO  . Cancer ([facility])    skin   . Degenerative disc disease, lumbar    L4-L5  . Diverticulosis    ITIS  . History of hiatal hernia   . Hydronephrosis    right  . Hypercholesteremia   . Hypertension    CONTROLLED ON MEDS  . IBS (irritable bowel syndrome)   . Knee fracture, right 03/05/2015   DIFFICULTY WITH AMBULATION  . Osteopenia   . Ovarian cyst   . Pancreatic insufficiency   . Wears hearing aid    bilateral    Past Surgical History:  Procedure Laterality Date  . CATARACT EXTRACTION W/PHACO Right 11/02/2015   Procedure: CATARACT EXTRACTION PHACO AND INTRAOCULAR LENS PLACEMENT (IOC);  Surgeon: Leandrew Koyanagi, MD;  Location: [facility];  Service: Ophthalmology;  Laterality: Right;  . CATARACT EXTRACTION W/PHACO Left 07/18/2016   Procedure: CATARACT EXTRACTION PHACO AND INTRAOCULAR LENS PLACEMENT (IOC);  Surgeon: Leandrew Koyanagi, MD;  Location: [facility];  Service: Ophthalmology;  Laterality: Left;  . COLONOSCOPY    . FRACTURE SURGERY Left    hip  . INTRAMEDULLARY (IM) NAIL INTERTROCHANTERIC Left 02/12/2017   Procedure: INTRAMEDULLARY (IM) NAIL INTERTROCHANTRIC;  Surgeon: Earnestine Leys, MD;  Location: [facility];  Service:  Orthopedics;  Laterality: Left;  . kidney stent    . OOPHORECTOMY Bilateral 2015  . TONSILLECTOMY    . TOTAL KNEE ARTHROPLASTY Right 08/07/2017   Procedure: TOTAL KNEE ARTHROPLASTY;  Surgeon: Earnestine Leys, MD;  Location: [facility];  Service: Orthopedics;  Laterality: Right;    Family History  Problem Relation Age of Onset  . Breast cancer Mother 64  . Leukemia Mother   . Stroke Father    Social History:  reports that she has never smoked. She has never used smokeless tobacco. She reports that she does not drink alcohol or use drugs.  Allergies: No Known Allergies  Home Medications:    ROS: Unable to obtain due to aphasia.   Physical Examination: HEENT: /AT. Neck supple. Lungs: Respiratory rate increased in the context of anxious affect and agitation. No rhonchi or wheezes. Ext: Warm and well perfused distal extremities. There is TTP to right wrist with subcutaneous mass suspicious for a small hematoma.  Neurologic Examination: Mental Status: Awake, dense receptive and expressive aphasia without speech output and inability to follow verbal instructions. Appears anxious and agitated. Pained affect when right wrist is palpated.  Cranial Nerves: II:  Inconsistent blink to threat bilaterally, but will fixate on examiner's face and track visual stimuli. PERRL.  III,IV, VI: EOMI without nystagmus. No forced gaze deviation. No ptosis. V,VII: Face symmetric at baseline. Grimaces symmetrically to brow ridge pressure bilaterally.  VIII: Unable to assess.  IX,X: Unable  to follow commands for assessment XI: Head at midline without forced rotational deviation XII: Does not protrude tongue to command Motor/Sensory: Moves RUE less briskly than left, with 4/5 purposeful withdrawal to noxious on right and 5/5 purposeful withdrawal on left Withdraws bilateral lower extremities to plantar stimulation with 4+/5 strength. No asymmetry of BLE.  Deep Tendon Reflexes:  Unremarkable. Plantars:  Equivocal bilaterally. Thrashes in response to plantar stimulation.  Cerebellar/Gait: Unable to assess   Results for orders placed or performed during the hospital encounter of 04/03/18 (from the past 48 hour(s))  I-Stat Chem 8, ED     Status: Abnormal   Collection Time: 04/03/18  5:51 PM  Result Value Ref Range   Sodium 133 (L) 135 - 145 mmol/L   Potassium 3.9 3.5 - 5.1 mmol/L   Chloride 103 98 - 111 mmol/L   BUN 15 8 - 23 mg/dL   Creatinine, Ser 0.80 0.44 - 1.00 mg/dL   Glucose, Bld 136 (H) 70 - 99 mg/dL   Calcium, Ion 1.25 1.15 - 1.40 mmol/L   TCO2 21 (L) 22 - 32 mmol/L   Hemoglobin 13.9 12.0 - 15.0 g/dL   HCT 41.0 36.0 - 46.0 %   No results found.  Assessment: 84 y.o. female presenting with acute onset of aphasia and right sided weakness 1. Exam findings and history are most consistent with a left hemisphere stroke.  2. CT shows no hemorrhage. CTA shows no LVO. Perfusion scan degraded by motion.  3. Stroke Risk Factors - HTN, prior TIA and hypercholesterolemia. 4. Regarding small hematoma versus fracture at the site of the wrist injury, the benefits of tPA are felt to outweigh the risks of possible hematoma formation/expansion, including a hematoma large enough to result in complications such as compromised blood supply to the hand with distal necrosis and/or permanent nerve injury. Discussed with Dr. Dayna Barker and we are in agreement.  5. Acute impacted and minimally displaced fractures of the distal radius and ulna just proximal to the wrist joint.  Plan: 1. The patient has no contraindications to tPA. After discussing risks/benefits of tPA treatment versus no treatment with patient's daughter over the telephone, including the risk of ICH or hematoma elsewhere, the patient's daughter agreed to proceed with IV tPA.  2. The patient is not an endovascular candidate due to no LVO.  3. MRI of the brain without contrast 4. Echocardiogram 5. No antiplatelet medications or anticoagulants  for at least 24 hours following tPA. Consider starting ASA if no hemorrhage on follow up CT head. DVT prophylaxis with SCDs.  6. Post-tPA BP management protocol.  7. Risk factor modification 8. Telemetry monitoring 9. Frequent neuro checks 10. HgbA1c, fasting lipid panel 11. Continue pravastatin. 12. PT consult, OT consult, Speech consult 13. Discussed code status with patient's daughter, who stated that she would like her to be a full code. It is noted that the patient has had different code status on a prior admission.  14. Orthopedics consult for right distal radius and ulna fractures has been called in by the ED, per Dr. Dayna Barker. ACE bandage for now and monitor closely for possible hematoma development.   60 minutes spent in the emergent neurological evaluation and management of this critically ill acute stroke patient.   @Electronically  signed: Dr. Kerney Elbe 04/03/2018, 5:58 PM

## 2018-04-03 NOTE — ED Triage Notes (Signed)
Pt brought in by [facility] from home. Pt was at home outside with her neighbor when her neighbor noticed she became altered and then fell to the ground. LKW was 1645. The neighbor brought her inside where she was found by EMS. EMS reports expressive aphasia, right arm and leg droop, and right gaze. No noted facial droop. Pt does have swelling to her right arm from falling on it.

## 2018-04-04 ENCOUNTER — Inpatient Hospital Stay (HOSPITAL_COMMUNITY): Payer: PPO

## 2018-04-04 ENCOUNTER — Other Ambulatory Visit (HOSPITAL_COMMUNITY): Payer: PPO

## 2018-04-04 ENCOUNTER — Other Ambulatory Visit: Payer: Self-pay

## 2018-04-04 DIAGNOSIS — I6300 Cerebral infarction due to thrombosis of unspecified precerebral artery: Secondary | ICD-10-CM

## 2018-04-04 LAB — LIPID PANEL
CHOLESTEROL: 161 mg/dL (ref 0–200)
HDL: 56 mg/dL (ref >40)
LDL CALC: 86 mg/dL (ref 0–99)
TRIGLYCERIDES: 93 mg/dL (ref <150)
Total CHOL/HDL Ratio: 2.9 RATIO
VLDL: 19 mg/dL (ref 0–40)

## 2018-04-04 LAB — HEMOGLOBIN A1C
Hgb A1c MFr Bld: 4.7 % — ABNORMAL LOW (ref 4.8–5.6)
Mean Plasma Glucose: 88.19 mg/dL

## 2018-04-04 LAB — MRSA PCR SCREENING: MRSA by PCR: NEGATIVE

## 2018-04-04 MED ORDER — CLOPIDOGREL BISULFATE 75 MG PO TABS
75.0000 mg | ORAL_TABLET | Freq: Every day | ORAL | Status: DC
  Administered 2018-04-04 – 2018-04-09 (×6): 75 mg via ORAL
  Filled 2018-04-04 (×6): qty 1

## 2018-04-04 MED ORDER — ORAL CARE MOUTH RINSE
15.0000 mL | Freq: Two times a day (BID) | OROMUCOSAL | Status: DC
  Administered 2018-04-04 – 2018-04-09 (×8): 15 mL via OROMUCOSAL

## 2018-04-04 MED ORDER — LEVETIRACETAM ER 500 MG PO TB24
500.0000 mg | ORAL_TABLET | Freq: Every day | ORAL | Status: DC
  Administered 2018-04-04 – 2018-04-10 (×7): 500 mg via ORAL
  Filled 2018-04-04 (×8): qty 1

## 2018-04-04 MED ORDER — CLOPIDOGREL BISULFATE 75 MG PO TABS: 75.0000 mg | ORAL_TABLET | Freq: Every day | ORAL | Status: DC

## 2018-04-04 NOTE — Progress Notes (Signed)
OT Cancellation Note  Patient Details Name: Kellie Harris MRN: [id number] DOB: Mar 29, 1931   Cancelled Treatment:    Reason Eval/Treat Not Completed: Patient not medically ready(Bedrest) OT order received and appreciated however this conflicts with current bedrest order set. Please increase activity tolerance as appropriate and remove bedrest from orders. . Please contact OT at [phone] if bed rest order is discontinued. OT will hold evaluation at this time and will check back as time allows pending increased activity orders.   Parke Poisson B 04/04/2018, 9:27 AM

## 2018-04-04 NOTE — ED Notes (Addendum)
Pts BP too high to give TPA. Verbal order received from neurology to give 20 mg of labetalol before TPA administered. Labetalol given; BP dropped to 156/71.

## 2018-04-04 NOTE — Procedures (Signed)
  [place] A. Merlene Laughter, MD     [email or web address]           HISTORY: The patient is 84 year old presents with acute focal neurological deficits.  The study is being done to evaluate for seizure as the etiology.  MEDICATIONS: Scheduled Meds: .  stroke: mapping our early stages of recovery book   Does not apply Once  . amLODipine  5 mg Oral Daily  . gabapentin  100 mg Oral TID  . labetalol  20 mg Intravenous Once  . lisinopril  20 mg Oral BID  . mouth rinse  15 mL Mouth Rinse BID  . pravastatin  20 mg Oral QHS   Continuous Infusions: . sodium chloride 75 mL/hr at 04/04/18 1600  . famotidine (PEPCID) IV Stopped (04/04/18 0943)  . niCARDipine     PRN Meds:.acetaminophen **OR** acetaminophen (TYLENOL) oral liquid 160 mg/5 mL **OR** acetaminophen, senna-docusate  Prior to Admission medications   Medication Sig Start Date End Date Taking? Authorizing Provider  acetaminophen (TYLENOL) 500 MG tablet Take 500-1,000 mg by mouth every 6 (six) hours as needed (for pain or headaches).   Yes [provider]  amLODipine (NORVASC) 5 MG tablet Take 5 mg by mouth daily.   Yes [provider]  amoxicillin (AMOXIL) 500 MG capsule Take 2,000 mg by mouth See admin instructions. Take 2,000 mg by mouth one hour prior to dental procedures 01/01/18  Yes [provider]  clopidogrel (PLAVIX) 75 MG tablet Take 75 mg by mouth daily.   Yes [provider]  lisinopril (PRINIVIL,ZESTRIL) 20 MG tablet Take 20 mg by mouth 2 (two) times daily.   Yes [provider]  pravastatin (PRAVACHOL) 20 MG tablet Take 1 tablet (20 mg total) by mouth at bedtime. 08/09/17  Yes Earnestine Leys, MD  Probiotic CAPS Take 1 capsule by mouth daily as needed (when IBS flares).   Yes [provider]  gabapentin (NEURONTIN) 100 MG capsule Take 1 capsule (100 mg total) by mouth 3 (three) times daily. Patient not taking: Reported on 04/03/2018 08/09/17   Earnestine Leys, MD      ANALYSIS: A 16 channel recording using standard 10 20 measurements is conducted for 22 minutes.   The background activity gets as high as 8 hertz.  There is beta activity observed in the frontal areas.  Awake and drowsy architecture are documented. The recording is somewhat degraded by persistent myogenic artifact.  Photic stimulation and hyperventilation are not conducted.  There is no focal or lateralized slowing.  There is no epileptiform activity is observed.   IMPRESSION: 1.  This is a normal recording of awake and drowsy states.      Brenten Janney A. Merlene Laughter, M.D.  Diplomate, Tax adviser of Psychiatry and Neurology ( Neurology).

## 2018-04-04 NOTE — Progress Notes (Signed)
Orthopedic Tech Progress Note Patient Details:  Kellie Harris [birth year] [id number]  Ortho Devices Type of Ortho Device: Arm sling, Sugartong splint Ortho Device/Splint Location: rue Ortho Device/Splint Interventions: Application   Post Interventions Patient Tolerated: Well Instructions Provided: Care of device   [name] 04/04/2018, 11:21 AM

## 2018-04-04 NOTE — Progress Notes (Signed)
Orthopedic Tech Progress Note Patient Details:  Kellie Harris 07-Jan-1931 [id number]  Ortho Devices Type of Ortho Device: Arm sling, Sugartong splint Ortho Device/Splint Location: rue(carter arm pillow) Ortho Device/Splint Interventions: Application   Post Interventions Patient Tolerated: Well Instructions Provided: Care of device  Carter arm pillow applied to rue; ALSO SUGARTONG WAS LOOSENED TO ALLEVIATE SWELLING OF RUE [name] 04/04/2018, 4:37 PM

## 2018-04-04 NOTE — Progress Notes (Signed)
EEG completed, results pending. 

## 2018-04-04 NOTE — Evaluation (Addendum)
Physical Therapy Evaluation Patient Details Name: Kellie Harris MRN: [id number] DOB: 1931-03-24 Today's Date: 04/04/2018   History of Present Illness  84 y.o. female admitted on 04/03/18 for acute aphasia and R sided weakness.  CT was negative, so tPA given.  MRI is pending. Suspected L hemisphere stroke.  Pt also fell sustaining a R wrist fx due to have ORIF on 04/08/18 and is NWB pre op and post op.  Pt with significant PMH of HOH (wears hearing aids), osteopenia, R knee fx, HTN, DDD (lumbar), R TKA, IM nail L hip, and bil cataract surgery.  Clinical Impression  Pt was able to get OOB to the recliner chair today with one person moderate assist.  She is off balance and has some mild functional weakness in her left leg.  She would benefit from post acute rehab at discharge and I have asked our inpatient rehab unit to look at her chart to see if she is a candidate to go there.   PT to follow acutely for deficits listed below.       Follow Up Recommendations CIR    Equipment Recommendations  Rolling walker with 5" wheels;Other (comment)(R platform RW)    Recommendations for Other Services Rehab consult     Precautions / Restrictions Precautions Precautions: Fall;Other (comment) Precaution Comments: BP restrictions no >180/>105 Required Braces or Orthoses: Sling Restrictions Weight Bearing Restrictions: Yes RUE Weight Bearing: Non weight bearing(R wrist, ok for platform post op per PA note)      Mobility  Bed Mobility Overal bed mobility: Needs Assistance Bed Mobility: Supine to Sit     Supine to sit: Min assist     General bed mobility comments: Min assist to support trunk to get to sitting EOB.  Pt able to manage moving both legs to EOB.   Transfers Overall transfer level: Needs assistance Equipment used: 1 person hand held assist Transfers: Sit to/from Stand Sit to Stand: Mod assist;From elevated surface         General transfer comment: Mod assist for balance to stand at  EOB, a bit of posterior propulsion initially, then once going, is less.   Ambulation/Gait Ambulation/Gait assistance: Mod assist Gait Distance (Feet): 10 Feet Assistive device: 1 person hand held assist Gait Pattern/deviations: Step-through pattern;Staggering left;Staggering right     General Gait Details: Pt with uncoordinated gait pattern, some functional weakness noted in her right LE.  Assist at trunk mostly for balance.       Modified Rankin (Stroke Patients Only) Modified Rankin (Stroke Patients Only) Pre-Morbid Rankin Score: No symptoms Modified Rankin: Moderately severe disability     Balance Overall balance assessment: Needs assistance Sitting-balance support: Feet supported;Single extremity supported Sitting balance-Leahy Scale: Fair     Standing balance support: Single extremity supported Standing balance-Leahy Scale: Poor                               Pertinent Vitals/Pain Pain Assessment: Faces Faces Pain Scale: Hurts little more Pain Location: R wrist with movement of RUE Pain Descriptors / Indicators: Grimacing;Guarding Pain Intervention(s): Limited activity within patient's tolerance;Monitored during session;Repositioned;Ice applied    Home Living Family/patient expects to be discharged to:: Private residence Living Arrangements: Alone Available Help at Discharge: Family;Available PRN/intermittently(daughter lives in [place]) Type of Home: House Home Access: Stairs to enter Entrance Stairs-Rails: None Entrance Stairs-Number of Steps: 2 Home Layout: One level Home Equipment: Environmental consultant - 2 wheels;Cane - single point;Walker - 4  wheels;Shower seat;Grab bars - tub/shower;Hand held shower head;Other (comment)(multiple canes, multiple walkers)      Prior Function Level of Independence: Independent with assistive device(s);Needs assistance   Gait / Transfers Assistance Needed: uses RW (standard) in house for gait.  Keeps another RW (rollator) in  her car for community access, and was using her cane when she had the stroke and fell (taking her garbage out).   ADL's / Homemaking Assistance Needed: does her own bathing, dressing, "what housework that is done" and cooking.  She still drives despite recommendations and "does not like to [place] [address]".          Hand Dominance   Dominant Hand: Right    Extremity/Trunk Assessment   Upper Extremity Assessment Upper Extremity Assessment: Defer to OT evaluation    Lower Extremity Assessment Lower Extremity Assessment: RLE deficits/detail RLE Deficits / Details: right leg with some mild weakness compared to L.     Cervical / Trunk Assessment Cervical / Trunk Assessment: Kyphotic  Communication   Communication: HOH;Expressive difficulties  Cognition Arousal/Alertness: Awake/alert Behavior During Therapy: WFL for tasks assessed/performed Overall Cognitive Status: Impaired/Different from baseline Area of Impairment: Orientation;Attention;Memory;Following commands;Safety/judgement;Problem solving;Awareness                 Orientation Level: Disoriented to;Place;Time;Situation Current Attention Level: Sustained Memory: Decreased short-term memory Following Commands: Follows one step commands consistently Safety/Judgement: Decreased awareness of safety;Decreased awareness of deficits Awareness: Intellectual Problem Solving: Difficulty sequencing;Requires verbal cues;Requires tactile cues General Comments: Pt reports that it is April, "they say I am at [facility]", she does not remember the event, who called 911 or the ambulance ride.  She did not know anything about a stroke, and she told me it was 64.  She seems to get some details confused and/or insert wrong words at times. She is HOH which does not help.       General Comments General comments (skin integrity, edema, etc.): VSS throughout, BPs checked and were in parameters set by MD.         Assessment/Plan    PT  Assessment Patient needs continued PT services  PT Problem List Decreased strength;Decreased activity tolerance;Decreased balance;Decreased mobility;Decreased coordination;Decreased cognition;Decreased knowledge of use of DME;Decreased safety awareness;Decreased knowledge of precautions;Pain       PT Treatment Interventions DME instruction;Gait training;Stair training;Functional mobility training;Therapeutic activities;Therapeutic exercise;Balance training;Neuromuscular re-education;Cognitive remediation;Patient/family education;Modalities    PT Goals (Current goals can be found in the Care Plan section)  Acute Rehab PT Goals Patient Stated Goal: "do what you tell me to get better" PT Goal Formulation: With patient Time For Goal Achievement: 04/18/18 Potential to Achieve Goals: Good    Frequency Min 4X/week   Barriers to discharge Decreased caregiver support pt lives alone       AM-PAC PT "6 Clicks" Daily Activity  Outcome Measure Difficulty turning over in bed (including adjusting bedclothes, sheets and blankets)?: A Little Difficulty moving from lying on back to sitting on the side of the bed? : Unable Difficulty sitting down on and standing up from a chair with arms (e.g., wheelchair, bedside commode, etc,.)?: Unable Help needed moving to and from a bed to chair (including a wheelchair)?: A Lot Help needed walking in hospital room?: A Lot Help needed climbing 3-5 steps with a railing? : A Lot 6 Click Score: 11    End of Session Equipment Utilized During Treatment: Gait belt Activity Tolerance: Patient tolerated treatment well Patient left: in chair;with call bell/phone within reach;with chair alarm  set Nurse Communication: Mobility status PT Visit Diagnosis: Muscle weakness (generalized) (M62.81);Difficulty in walking, not elsewhere classified (R26.2);Hemiplegia and hemiparesis Hemiplegia - Right/Left: Right Hemiplegia - dominant/non-dominant: Dominant Hemiplegia - caused  by: Cerebral infarction    Time: 1134-1203 PT Time Calculation (min) (ACUTE ONLY): 29 min   Charges:           Wells Guiles B. Leza Apsey, PT, DPT [phone]   PT Evaluation $PT Eval Moderate Complexity: 1 Mod PT Treatments $Therapeutic Activity: 8-22 mins        04/04/2018, 12:17 PM

## 2018-04-04 NOTE — Progress Notes (Signed)
Patient returned back to unit with RN from getting MRI. Dr.Sethi text paged that patient just completed MRI. EEG in room at this time to do study. Daughter [name] at bedside, updated on status and plan.

## 2018-04-04 NOTE — Evaluation (Signed)
Clinical/Bedside Swallow Evaluation Patient Details  Name: Kellie Harris MRN: [id number] Date of Birth: [birth year]  Today's Date: 04/04/2018 Time: SLP Start Time (ACUTE ONLY): 3086 SLP Stop Time (ACUTE ONLY): 0950 SLP Time Calculation (min) (ACUTE ONLY): 13 min  Past Medical History:  Past Medical History:  Diagnosis Date  . Arthritis    right knee, left shoulder/ OSTEO  . Cancer ([place])    skin   . Degenerative disc disease, lumbar    L4-L5  . Diverticulosis    ITIS  . History of hiatal hernia   . Hydronephrosis    right  . Hypercholesteremia   . Hypertension    CONTROLLED ON MEDS  . IBS (irritable bowel syndrome)   . Knee fracture, right 03/05/2015   DIFFICULTY WITH AMBULATION  . Osteopenia   . Ovarian cyst   . Pancreatic insufficiency   . Wears hearing aid    bilateral   Past Surgical History:  Past Surgical History:  Procedure Laterality Date  . CATARACT EXTRACTION W/PHACO Right 11/02/2015   Procedure: CATARACT EXTRACTION PHACO AND INTRAOCULAR LENS PLACEMENT (IOC);  Surgeon: Leandrew Koyanagi, MD;  Location: [place];  Service: Ophthalmology;  Laterality: Right;  . CATARACT EXTRACTION W/PHACO Left 07/18/2016   Procedure: CATARACT EXTRACTION PHACO AND INTRAOCULAR LENS PLACEMENT (IOC);  Surgeon: Leandrew Koyanagi, MD;  Location: [place];  Service: Ophthalmology;  Laterality: Left;  . COLONOSCOPY    . FRACTURE SURGERY Left    hip  . INTRAMEDULLARY (IM) NAIL INTERTROCHANTERIC Left 02/12/2017   Procedure: INTRAMEDULLARY (IM) NAIL INTERTROCHANTRIC;  Surgeon: Earnestine Leys, MD;  Location: [facility];  Service: Orthopedics;  Laterality: Left;  . kidney stent    . OOPHORECTOMY Bilateral 2015  . TONSILLECTOMY    . TOTAL KNEE ARTHROPLASTY Right 08/07/2017   Procedure: TOTAL KNEE ARTHROPLASTY;  Surgeon: Earnestine Leys, MD;  Location: [facility];  Service: Orthopedics;  Laterality: Right;   HPI:  Pt is an 84 yo female who presented with acute onset aphasia  and R sided weakness, which resulted in a fall on her R wrist with distal radius and ulna fx.  CT head negative; MRI pending. Pt is s/p tPA. Pt had a similar episode in March 2019, at which time she was seen by SLP with no signs of cognitive-linguistic impairment when accounting for pt's difficulty hearing. PMH: HTN, HH, skin cancer, arthritis   Assessment / Plan / Recommendation Clinical Impression  Pt has no overt signs of dysphagia or aspiration during self-fed trials. Recommend that she start regular diet textures and thin liquids. No further f/u for dysphagia indicated. SLP Visit Diagnosis: Dysphagia, unspecified (R13.10)    Aspiration Risk  Mild aspiration risk    Diet Recommendation Regular;Thin liquid   Liquid Administration via: Cup;Straw Medication Administration: Whole meds with liquid Supervision: Patient able to self feed;Intermittent supervision to cue for compensatory strategies Compensations: Slow rate;Small sips/bites Postural Changes: Seated upright at 90 degrees    Other  Recommendations Oral Care Recommendations: Oral care BID   Follow up Recommendations None      Frequency and Duration            Prognosis        Swallow Study   General HPI: Pt is an 84 yo female who presented with acute onset aphasia and R sided weakness, which resulted in a fall on her R wrist with distal radius and ulna fx.  CT head negative; MRI pending. Pt is s/p tPA. Pt had a similar episode in March  2019, at which time she was seen by SLP with no signs of cognitive-linguistic impairment when accounting for pt's difficulty hearing. PMH: HTN, HH, skin cancer, arthritis Type of Study: Bedside Swallow Evaluation Previous Swallow Assessment: none in chart Diet Prior to this Study: NPO Temperature Spikes Noted: No Respiratory Status: Room air History of Recent Intubation: No Behavior/Cognition: Alert;Cooperative;Requires cueing Oral Cavity Assessment: Within Functional Limits Oral Care  Completed by SLP: No Oral Cavity - Dentition: Adequate natural dentition Vision: Functional for self-feeding Self-Feeding Abilities: Able to feed self Patient Positioning: Upright in bed Baseline Vocal Quality: Normal Volitional Cough: Strong Volitional Swallow: Unable to elicit    Oral/Motor/Sensory Function Overall Oral Motor/Sensory Function: Within functional limits   Ice Chips Ice chips: Not tested   Thin Liquid Thin Liquid: Within functional limits Presentation: Cup;Self Fed;Straw    Nectar Thick Nectar Thick Liquid: Not tested   Honey Thick Honey Thick Liquid: Not tested   Puree Puree: Not tested   Solid     Solid: Within functional limits Presentation: Self [place] 04/04/2018,11:47 AM  Germain Osgood, M.A. CCC-SLP [phone]

## 2018-04-04 NOTE — Plan of Care (Signed)
Patient voiding well using purewick.

## 2018-04-04 NOTE — Evaluation (Signed)
Occupational Therapy Evaluation Patient Details Name: Kellie Harris MRN: [id number] DOB: 27-Aug-1930 Today's Date: 04/04/2018    History of Present Illness 84 y.o. female admitted on 04/03/18 for acute aphasia and R sided weakness.  CT was negative, so tPA given.  MRI is pending. Suspected L hemisphere stroke.  Pt with significant PMH of HOH (wears hearing aids), osteopenia, R knee fx, HTN, DDD (lumbar), R TKA, IM nail L hip, and bil cataract surgery.   Clinical Impression   PT admitted with s/p fall with R UE distal radial and ulnar fx. Pt currently with functional limitiations due to the deficits listed below (see OT problem list). Pt currently with R splint pushing on dorsum of hand. Ortho tech called to make modifications. PA Ainsley Spinner notified and order with carter foam block requested placed. OT to continue to monitor R UE Pt will benefit from skilled OT to increase their independence and safety with adls and balance to allow discharge CIR.     Follow Up Recommendations  CIR    Equipment Recommendations       Recommendations for Other Services Rehab consult     Precautions / Restrictions Precautions Precautions: Fall;Other (comment) Precaution Comments: BP restrictions no >180/>105 Required Braces or Orthoses: Sling Restrictions Weight Bearing Restrictions: Yes RUE Weight Bearing: Non weight bearing      Mobility Bed Mobility Overal bed mobility: Needs Assistance Bed Mobility: Supine to Sit     Supine to sit: Min assist     General bed mobility comments: pt with x3 attempts for OOB without success. pt moving legs off but unable to elevate trunk  Transfers Overall transfer level: Needs assistance Equipment used: 1 person hand held assist Transfers: Sit to/from Stand Sit to Stand: Mod assist;From elevated surface         General transfer comment: hand held (A) and cues to avoid pushing with R UE    Balance Overall balance assessment: Needs assistance   Sitting  balance-Leahy Scale: Fair       Standing balance-Leahy Scale: Poor                             ADL either performed or assessed with clinical judgement   ADL Overall ADL's : Needs assistance/impaired Eating/Feeding: Maximal assistance   Grooming: Maximal assistance   Upper Body Bathing: Maximal assistance   Lower Body Bathing: Maximal assistance   Upper Body Dressing : Maximal assistance   Lower Body Dressing: Maximal assistance   Toilet Transfer: Moderate assistance   Toileting- Clothing Manipulation and Hygiene: Moderate assistance       Functional mobility during ADLs: Moderate assistance General ADL Comments: pt with hand held (A) to the bathroom for [facility] use over toilet. pt able to complete hygiene static sitting on commode.      Vision         Perception     Praxis      Pertinent Vitals/Pain Pain Assessment: Faces Faces Pain Scale: Hurts little more Pain Location: R wrist with movement of RUE Pain Descriptors / Indicators: Grimacing;Guarding Pain Intervention(s): Monitored during session;Premedicated before session;Repositioned     Hand Dominance Right   Extremity/Trunk Assessment Upper Extremity Assessment Upper Extremity Assessment: RUE deficits/detail RUE Deficits / Details: R sugartong splint noted to  cause incr edema to R hand. splint opened to do skin check and noted to have mark on dorsum of the hand. ace wrap reapplied and PA Ainsley Spinner called .  Order placed for carter foam block splint and modification to the sugar tong splint with incr wrist extension to prevent skin break down and neutral wrist 20 degrees of extension for rest hand position   Lower Extremity Assessment Lower Extremity Assessment: Defer to PT evaluation   Cervical / Trunk Assessment Cervical / Trunk Assessment: Kyphotic   Communication Communication Communication: HOH;Expressive difficulties   Cognition Arousal/Alertness: Awake/alert Behavior During Therapy:  WFL for tasks assessed/performed Overall Cognitive Status: Impaired/Different from baseline Area of Impairment: Orientation;Attention;Memory;Following commands;Safety/judgement;Problem solving;Awareness                 Orientation Level: Disoriented to;Place;Time;Situation Current Attention Level: Sustained Memory: Decreased short-term memory Following Commands: Follows one step commands consistently Safety/Judgement: Decreased awareness of safety;Decreased awareness of deficits Awareness: Intellectual Problem Solving: Difficulty sequencing;Requires verbal cues;Requires tactile cues     General Comments  VSS    Exercises     Shoulder Instructions      Home Living Family/patient expects to be discharged to:: Private residence Living Arrangements: Alone Available Help at Discharge: Family;Available PRN/intermittently Type of Home: House Home Access: Stairs to enter [employer] of Steps: 2 Entrance Stairs-Rails: None Home Layout: One level     Bathroom Shower/Tub: Teacher, early years/pre: Handicapped height     Home Equipment: Environmental consultant - 2 wheels;Cane - single point;Walker - 4 wheels;Shower seat;Grab bars - tub/shower;Hand held shower head;Other (comment)   Additional Comments: daughter present at eval and reports neighbor was with her at the time of the event      Prior Functioning/Environment Level of Independence: Independent with assistive device(s);Needs assistance  Gait / Transfers Assistance Needed: uses RW (standard) in house for gait.  Keeps another RW (rollator) in her car for community access, and was using her cane when she had the stroke and fell (taking her garbage out).  ADL's / Homemaking Assistance Needed: does her own bathing, dressing, "what housework that is done" and cooking.  She still drives despite recommendations and "[place] not like to [place] [address]".   Communication / Swallowing Assistance Needed: HOH, see also SLP  note Comments: Pt reports she's been using RW at all times        OT Problem List: Decreased strength;Decreased range of motion;Decreased activity tolerance;Impaired balance (sitting and/or standing);Decreased safety awareness;Decreased knowledge of use of DME or AE;Decreased cognition;Impaired UE functional use;Pain;Increased edema      OT Treatment/Interventions: Self-care/ADL training;Therapeutic exercise;Neuromuscular education;Energy conservation;DME and/or AE instruction;Splinting;Modalities;Manual therapy;Therapeutic activities;Cognitive remediation/compensation;Patient/family education;Balance training    OT Goals(Current goals can be found in the care plan section) Acute Rehab OT Goals Patient Stated Goal: to lay down OT Goal Formulation: With patient/family Time For Goal Achievement: 04/18/18 Potential to Achieve Goals: Good  OT Frequency: Min 3X/week   Barriers to D/C:            Co-evaluation              AM-PAC PT "6 Clicks" Daily Activity     Outcome Measure Help from another person eating meals?: A Lot Help from another person taking care of personal grooming?: A Lot Help from another person toileting, which includes using toliet, bedpan, or urinal?: A Lot Help from another person bathing (including washing, rinsing, drying)?: A Lot Help from another person to put on and taking off regular upper body clothing?: A Lot Help from another person to put on and taking off regular lower body clothing?: A Lot 6 Click Score: 12   End of  Session Nurse Communication: Mobility status;Precautions;Weight bearing status  Activity Tolerance: Patient tolerated treatment well Patient left: in bed;with call bell/phone within reach;with bed alarm set;with family/visitor present;with SCD's reapplied  OT Visit Diagnosis: Unsteadiness on feet (R26.81)                Time: 1541-1600 OT Time Calculation (min): 19 min Charges:  OT General Charges $OT Visit: 1 Visit OT  Evaluation $OT Eval Moderate Complexity: 1 Mod   Jeri Modena   OTR/L Pager: [phone] Office: [phone] .   Parke Poisson B 04/04/2018, 4:55 PM

## 2018-04-04 NOTE — Consult Note (Signed)
Orthopaedic Trauma Service (OTS) Consult   Patient ID: Kellie Harris MRN: [id number] DOB/AGE: [birth year] 82 y.o.   Reason for Consult: R distal radius and ulna fracture  Referring Physician: Merrily Pew, MD (EDP)   HPI: Kellie Harris is an 84 y.o. RHD female with history of previous cerebrovascular events on plavix who apparently had another cerebrovascular event yesterday.  This caused her to fall landing on her right arm.  Patient was brought to [facility] for evaluation.  In addition to her CVA she was also found to have a right distal radius and ulnar fracture for which orthopedics was consulted for.  Her previous notes indicate the patient was aphasic with right-sided weakness on admission.  Patient is status post right total knee arthroplasty and left hip fracture repair in [place].  Her right knee was done in December 2018 and her left hip was done in 2017   Patient has been on a walker since December She is hesitant to get rid of it, ?  Balance issues  States she does not take any medication for osteoporosis States her last bone density scan was greater than 5 years ago  Past Medical History:  Diagnosis Date  . Arthritis    right knee, left shoulder/ OSTEO  . Cancer ([place])    skin   . Degenerative disc disease, lumbar    L4-L5  . Diverticulosis    ITIS  . History of hiatal hernia   . Hydronephrosis    right  . Hypercholesteremia   . Hypertension    CONTROLLED ON MEDS  . IBS (irritable bowel syndrome)   . Knee fracture, right 03/05/2015   DIFFICULTY WITH AMBULATION  . Osteopenia   . Ovarian cyst   . Pancreatic insufficiency   . Wears hearing aid    bilateral    Past Surgical History:  Procedure Laterality Date  . CATARACT EXTRACTION W/PHACO Right 11/02/2015   Procedure: CATARACT EXTRACTION PHACO AND INTRAOCULAR LENS PLACEMENT (IOC);  Surgeon: Leandrew Koyanagi, MD;  Location: [place] [facility];  Service: Ophthalmology;  Laterality: Right;  .  CATARACT EXTRACTION W/PHACO Left 07/18/2016   Procedure: CATARACT EXTRACTION PHACO AND INTRAOCULAR LENS PLACEMENT (IOC);  Surgeon: Leandrew Koyanagi, MD;  Location: [place];  Service: Ophthalmology;  Laterality: Left;  . COLONOSCOPY    . FRACTURE SURGERY Left    hip  . INTRAMEDULLARY (IM) NAIL INTERTROCHANTERIC Left 02/12/2017   Procedure: INTRAMEDULLARY (IM) NAIL INTERTROCHANTRIC;  Surgeon: Earnestine Leys, MD;  Location: [facility];  Service: Orthopedics;  Laterality: Left;  . kidney stent    . OOPHORECTOMY Bilateral 2015  . TONSILLECTOMY    . TOTAL KNEE ARTHROPLASTY Right 08/07/2017   Procedure: TOTAL KNEE ARTHROPLASTY;  Surgeon: Earnestine Leys, MD;  Location: [facility];  Service: Orthopedics;  Laterality: Right;    Family History  Problem Relation Age of Onset  . Breast cancer Mother 14  . Leukemia Mother   . Stroke Father     Social History:  reports that she has never smoked. She has never used smokeless tobacco. She reports that she does not drink alcohol or use drugs.  Allergies: No Known Allergies  Medications: I have reviewed the patient's current medications. Current Meds  Medication Sig  . acetaminophen (TYLENOL) 500 MG tablet Take 500-1,000 mg by mouth every 6 (six) hours as needed (for pain or headaches).  [name] Kitchen amLODipine (NORVASC) 5 MG tablet Take 5 mg by mouth daily.  [name] Kitchen amoxicillin (AMOXIL) 500 MG capsule Take 2,000 mg by  mouth See admin instructions. Take 2,000 mg by mouth one hour prior to dental procedures  . clopidogrel (PLAVIX) 75 MG tablet Take 75 mg by mouth daily.  [name] Kitchen lisinopril (PRINIVIL,ZESTRIL) 20 MG tablet Take 20 mg by mouth 2 (two) times daily.  . pravastatin (PRAVACHOL) 20 MG tablet Take 1 tablet (20 mg total) by mouth at bedtime.  . Probiotic CAPS Take 1 capsule by mouth daily as needed (when IBS flares).     Results for orders placed or performed during the hospital encounter of 04/03/18 (from the past 48 hour(s))  I-stat troponin, ED      Status: None   Collection Time: 04/03/18  5:49 PM  Result Value Ref Range   Troponin i, poc 0.00 0.00 - 0.08 ng/mL   Comment 3            Comment: Due to the release kinetics of cTnI, a negative result within the first hours of the onset of symptoms does not rule out myocardial infarction with certainty. If myocardial infarction is still suspected, repeat the test at appropriate intervals.   I-Stat Chem 8, ED     Status: Abnormal   Collection Time: 04/03/18  5:51 PM  Result Value Ref Range   Sodium 133 (L) 135 - 145 mmol/L   Potassium 3.9 3.5 - 5.1 mmol/L   Chloride 103 98 - 111 mmol/L   BUN 15 8 - 23 mg/dL   Creatinine, Ser 0.80 0.44 - 1.00 mg/dL   Glucose, Bld 136 (H) 70 - 99 mg/dL   Calcium, Ion 1.25 1.15 - 1.40 mmol/L   TCO2 21 (L) 22 - 32 mmol/L   Hemoglobin 13.9 12.0 - 15.0 g/dL   HCT 41.0 36.0 - 46.0 %  Protime-INR     Status: None   Collection Time: 04/03/18  6:16 PM  Result Value Ref Range   Prothrombin Time 13.5 11.4 - 15.2 seconds   INR 1.04     Comment: Performed at [facility], [place] [address] [place], [place] [address]  APTT     Status: None   Collection Time: 04/03/18  6:16 PM  Result Value Ref Range   aPTT 27 24 - 36 seconds    Comment: Performed at [facility] [address], [place], [place] [address]  CBC     Status: Abnormal   Collection Time: 04/03/18  6:16 PM  Result Value Ref Range   WBC 13.9 (H) 4.0 - 10.5 K/uL   RBC 4.34 3.87 - 5.11 MIL/uL   Hemoglobin 13.2 12.0 - 15.0 g/dL   HCT 40.4 36.0 - 46.0 %   MCV 93.1 78.0 - 100.0 fL   MCH 30.4 26.0 - 34.0 pg   MCHC 32.7 30.0 - 36.0 g/dL   RDW 12.9 11.5 - 15.5 %   Platelets 249 150 - 400 K/uL    Comment: Performed at [facility] [address], [place], [place] [address]  Differential     Status: Abnormal   Collection Time: 04/03/18  6:16 PM  Result Value Ref Range   Neutrophils Relative % 86 %   Neutro Abs 11.9 (H) 1.7 - 7.7 K/uL   Lymphocytes Relative 6 %   Lymphs Abs 0.9  0.7 - 4.0 K/uL   Monocytes Relative 6 %   Monocytes Absolute 0.8 0.1 - 1.0 K/uL   Eosinophils Relative 1 %   Eosinophils Absolute 0.2 0.0 - 0.7 K/uL   Basophils Relative 0 %   Basophils Absolute  0.1 0.0 - 0.1 K/uL   Immature Granulocytes 1 %   Abs Immature Granulocytes 0.1 0.0 - 0.1 K/uL    Comment: Performed at [facility], [place] [address] [place], [place] [address]  Comprehensive metabolic panel     Status: Abnormal   Collection Time: 04/03/18  6:16 PM  Result Value Ref Range   Sodium 133 (L) 135 - 145 mmol/L   Potassium 3.6 3.5 - 5.1 mmol/L    Comment: SLIGHT HEMOLYSIS   Chloride 101 98 - 111 mmol/L   CO2 20 (L) 22 - 32 mmol/L   Glucose, Bld 133 (H) 70 - 99 mg/dL   BUN 13 8 - 23 mg/dL   Creatinine, Ser 0.91 0.44 - 1.00 mg/dL   Calcium 10.0 8.9 - 10.3 mg/dL   Total Protein 6.1 (L) 6.5 - 8.1 g/dL   Albumin 3.9 3.5 - 5.0 g/dL   AST 22 15 - 41 U/L   ALT 11 0 - 44 U/L   Alkaline Phosphatase 69 38 - 126 U/L   Total Bilirubin 1.0 0.3 - 1.2 mg/dL   GFR calc non Af Amer 56 (L) >60 mL/min   GFR calc Af Amer >60 >60 mL/min    Comment: (NOTE) The eGFR has been calculated using the CKD EPI equation. This calculation has not been validated in all clinical situations. eGFR's persistently <60 mL/min signify possible Chronic Kidney Disease.    Anion gap 12 5 - 15    Comment: Performed at [facility] [address], [place], [place] [address]  MRSA PCR Screening     Status: None   Collection Time: 04/03/18 11:01 PM  Result Value Ref Range   MRSA by PCR NEGATIVE NEGATIVE    Comment:        The GeneXpert MRSA Assay (FDA approved for NASAL specimens only), is one component of a comprehensive MRSA colonization surveillance program. It is not intended to diagnose MRSA infection nor to guide or monitor treatment for MRSA infections. Performed at [facility], [place] [address] [place], [place] [address]   Hemoglobin A1c     Status: Abnormal   Collection Time:  04/04/18  4:14 AM  Result Value Ref Range   Hgb A1c MFr Bld 4.7 (L) 4.8 - 5.6 %    Comment: (NOTE) Pre diabetes:          5.7%-6.4% Diabetes:              >6.4% Glycemic control for   <7.0% adults with diabetes    Mean Plasma Glucose 88.19 mg/dL    Comment: Performed at [facility] [address], [place], [place] [address]  Lipid panel     Status: None   Collection Time: 04/04/18  4:14 AM  Result Value Ref Range   Cholesterol 161 0 - 200 mg/dL   Triglycerides 93 <150 mg/dL   HDL 56 >40 mg/dL   Total CHOL/HDL Ratio 2.9 RATIO   VLDL 19 0 - 40 mg/dL   LDL Cholesterol 86 0 - 99 mg/dL    Comment:        Total Cholesterol/HDL:CHD Risk Coronary Heart Disease Risk Table                     Men   Women  1/2 Average Risk   3.4   3.3  Average Risk       5.0   4.4  2 X Average Risk   9.6  7.1  3 X Average Risk  23.4   11.0        Use the calculated Patient Ratio above and the CHD Risk Table to determine the patient's CHD Risk.        ATP III CLASSIFICATION (LDL):  <100     mg/dL   Optimal  100-129  mg/dL   Near or Above                    Optimal  130-159  mg/dL   Borderline  160-189  mg/dL   High  >190     mg/dL   Very High Performed at [facility] [address], [place], [place] [address]     Ct Angio Head W Or Wo Contrast  Result Date: 04/03/2018 CLINICAL DATA:  Initial evaluation for acute stroke. Right-sided weakness. EXAM: CT ANGIOGRAPHY HEAD AND NECK CT PERFUSION BRAIN TECHNIQUE: Multidetector CT imaging of the head and neck was performed using the standard protocol during bolus administration of intravenous contrast. Multiplanar CT image reconstructions and MIPs were obtained to evaluate the vascular anatomy. Carotid stenosis measurements (when applicable) are obtained utilizing NASCET criteria, using the distal internal carotid diameter as the denominator. Multiphase CT imaging of the brain was performed following IV bolus contrast injection. Subsequent  parametric perfusion maps were calculated using RAPID software. CONTRAST:  140m ISOVUE-370 IOPAMIDOL (ISOVUE-370) INJECTION 76%, <See Chart> ISOVUE-370 IOPAMIDOL (ISOVUE-370) INJECTION 76% COMPARISON:  Prior study from 11/04/2017 FINDINGS: CTA NECK FINDINGS Aortic arch: Visualized aortic arch of normal caliber with normal branch pattern. Scattered atheromatous plaque within the arch and about the visualized great vessels without hemodynamically significant stenosis. Visualized subclavian arteries widely patent. Right carotid system: Right common carotid artery tortuous proximally but widely patent to the bifurcation. Minimal atheromatous plaque about the right bifurcation without significant stenosis. Right ICA widely patent distally to the skull base without stenosis, dissection, or occlusion. Left carotid system: Left common carotid artery tortuous proximally but widely patent to the bifurcation without stenosis. Eccentric calcified plaque about the left bifurcation without hemodynamically significant stenosis. Left ICA patent distally to the skull base without stenosis, dissection, or occlusion. Vertebral arteries: Both of the vertebral arteries arise from the subclavian arteries. Right vertebral artery dominant. Vertebral arteries tortuous proximally but are widely patent within the neck without stenosis, dissection, or occlusion. Skeleton: No acute osseous abnormality. No discrete lytic or blastic osseous lesions. Mild to moderate cervical spondylolysis at C3-4 through C6-7. Other neck: No acute soft tissue abnormality within the neck. Chronic left maxillary sinusitis noted. Upper chest: Visualized upper chest within normal limits. Partially visualized lungs are grossly clear. Review of the MIP images confirms the above findings CTA HEAD FINDINGS Anterior circulation: Petrous segments widely patent bilaterally. Mild scattered atheromatous plaque throughout the cavernous/supraclinoid ICAs without  hemodynamically significant stenosis. ICA termini widely patent. A1 segments widely patent. Normal anterior communicating artery. Anterior cerebral arteries widely patent to their distal aspects. Left M1 widely patent. No proximal left M2 occlusion distal left MCA branches well perfused. Right M1 widely patent to its distal aspect. Subtle 3 mm focal outpouching arising from the distal right M1 segment again seen, suspicious for small aneurysm versus infundibulum (series 7, image 105). No proximal right M2 occlusion. Distal right MCA branches well perfused. Posterior circulation: Atheromatous plaque within the V4 segments bilaterally with secondary mild to moderate stenoses, stable from previous. Right before dominant. Posterior inferior cerebral arteries patent bilaterally. Basilar artery widely patent to its distal aspect without  stenosis. Superior cerebral arteries patent bilaterally. Both of the posterior cerebral arteries primarily supplied via the basilar and are well perfused to their distal aspects. Venous sinuses: Grossly patent. Anatomic variants: None significant. Delayed phase: Not performed. Review of the MIP images confirms the above findings CT Brain Perfusion Findings: CT perfusion failed on this exam as no suitable AIF location was determined. Findings suspected to be related to motion artifact from the patient on this portion of the exam. IMPRESSION: 1. Negative CTA for large vessel occlusion. 2. Failed and nondiagnostic CT perfusion. 3. atheromatous change involving the major arterial vasculature of the head and neck, stable relative to previous CTA from 11/04/2017. Changes most notable within the bilateral V4 segments were there are mild to moderate stenoses. No other hemodynamically significant or correctable stenosis identified. 4. Stable 3 mm focal outpouching arising from the distal right M1 segment, possibly reflecting a small aneurysm versus vascular infundibulum. 5. Tortuosity of the major  arterial vasculature within the neck, suggesting chronic underlying hypertension. These results were communicated to Dr. Cheral Marker at [facility] 8/22/2019by text page via the [facility] messaging system. Electronically Signed   By: Jeannine Boga M.D.   On: 04/03/2018 19:19   Dg Forearm Right  Result Date: 04/03/2018 CLINICAL DATA:  85 y/o  F; fall with pain. EXAM: RIGHT FOREARM - 2 VIEW COMPARISON:  None. FINDINGS: Acute impacted and minimally displaced fractures of the distal radius and ulna just proximal to the wrist joint. Elbow and wrist joints are maintained. Bones are demineralized. IMPRESSION: Acute impacted and minimally displaced fractures of the distal radius and ulna just proximal to the wrist joint. Electronically Signed   By: Kristine Garbe M.D.   On: 04/03/2018 19:44   Dg Wrist Complete Right  Result Date: 04/04/2018 CLINICAL DATA:  Right wrist pain due to a fall 04/03/2018. Initial encounter. EXAM: RIGHT WRIST - COMPLETE 3+ VIEW COMPARISON:  Plain films right forearm 04/03/2018. FINDINGS: The patient is in a fiberglass splint. There is improved position and alignment of the patient's mildly impacted distal radius and ulna fractures seen on the prior study. Nondisplaced component of the fracture extends through the articular surface at the base of the radial styloid. Bones are osteopenic. No new abnormality. IMPRESSION: Improved position and alignment of distal radius and ulnar fractures as described. No new abnormality. Electronically Signed   By: Inge Rise M.D.   On: 04/04/2018 09:17   Ct Angio Neck W Or Wo Contrast  Result Date: 04/03/2018 CLINICAL DATA:  Initial evaluation for acute stroke. Right-sided weakness. EXAM: CT ANGIOGRAPHY HEAD AND NECK CT PERFUSION BRAIN TECHNIQUE: Multidetector CT imaging of the head and neck was performed using the standard protocol during bolus administration of intravenous contrast. Multiplanar CT image reconstructions and MIPs were  obtained to evaluate the vascular anatomy. Carotid stenosis measurements (when applicable) are obtained utilizing NASCET criteria, using the distal internal carotid diameter as the denominator. Multiphase CT imaging of the brain was performed following IV bolus contrast injection. Subsequent parametric perfusion maps were calculated using RAPID software. CONTRAST:  150m ISOVUE-370 IOPAMIDOL (ISOVUE-370) INJECTION 76%, <See Chart> ISOVUE-370 IOPAMIDOL (ISOVUE-370) INJECTION 76% COMPARISON:  Prior study from 11/04/2017 FINDINGS: CTA NECK FINDINGS Aortic arch: Visualized aortic arch of normal caliber with normal branch pattern. Scattered atheromatous plaque within the arch and about the visualized great vessels without hemodynamically significant stenosis. Visualized subclavian arteries widely patent. Right carotid system: Right common carotid artery tortuous proximally but widely patent to the bifurcation. Minimal atheromatous plaque about the right  bifurcation without significant stenosis. Right ICA widely patent distally to the skull base without stenosis, dissection, or occlusion. Left carotid system: Left common carotid artery tortuous proximally but widely patent to the bifurcation without stenosis. Eccentric calcified plaque about the left bifurcation without hemodynamically significant stenosis. Left ICA patent distally to the skull base without stenosis, dissection, or occlusion. Vertebral arteries: Both of the vertebral arteries arise from the subclavian arteries. Right vertebral artery dominant. Vertebral arteries tortuous proximally but are widely patent within the neck without stenosis, dissection, or occlusion. Skeleton: No acute osseous abnormality. No discrete lytic or blastic osseous lesions. Mild to moderate cervical spondylolysis at C3-4 through C6-7. Other neck: No acute soft tissue abnormality within the neck. Chronic left maxillary sinusitis noted. Upper chest: Visualized upper chest within  normal limits. Partially visualized lungs are grossly clear. Review of the MIP images confirms the above findings CTA HEAD FINDINGS Anterior circulation: Petrous segments widely patent bilaterally. Mild scattered atheromatous plaque throughout the cavernous/supraclinoid ICAs without hemodynamically significant stenosis. ICA termini widely patent. A1 segments widely patent. Normal anterior communicating artery. Anterior cerebral arteries widely patent to their distal aspects. Left M1 widely patent. No proximal left M2 occlusion distal left MCA branches well perfused. Right M1 widely patent to its distal aspect. Subtle 3 mm focal outpouching arising from the distal right M1 segment again seen, suspicious for small aneurysm versus infundibulum (series 7, image 105). No proximal right M2 occlusion. Distal right MCA branches well perfused. Posterior circulation: Atheromatous plaque within the V4 segments bilaterally with secondary mild to moderate stenoses, stable from previous. Right before dominant. Posterior inferior cerebral arteries patent bilaterally. Basilar artery widely patent to its distal aspect without stenosis. Superior cerebral arteries patent bilaterally. Both of the posterior cerebral arteries primarily supplied via the basilar and are well perfused to their distal aspects. Venous sinuses: Grossly patent. Anatomic variants: None significant. Delayed phase: Not performed. Review of the MIP images confirms the above findings CT Brain Perfusion Findings: CT perfusion failed on this exam as no suitable AIF location was determined. Findings suspected to be related to motion artifact from the patient on this portion of the exam. IMPRESSION: 1. Negative CTA for large vessel occlusion. 2. Failed and nondiagnostic CT perfusion. 3. atheromatous change involving the major arterial vasculature of the head and neck, stable relative to previous CTA from 11/04/2017. Changes most notable within the bilateral V4 segments  were there are mild to moderate stenoses. No other hemodynamically significant or correctable stenosis identified. 4. Stable 3 mm focal outpouching arising from the distal right M1 segment, possibly reflecting a small aneurysm versus vascular infundibulum. 5. Tortuosity of the major arterial vasculature within the neck, suggesting chronic underlying hypertension. These results were communicated to Dr. Cheral Marker at [facility] 8/22/2019by text page via the [facility] messaging system. Electronically Signed   By: Jeannine Boga M.D.   On: 04/03/2018 19:19   Ct Cerebral Perfusion W Contrast  Result Date: 04/03/2018 CLINICAL DATA:  Initial evaluation for acute stroke. Right-sided weakness. EXAM: CT ANGIOGRAPHY HEAD AND NECK CT PERFUSION BRAIN TECHNIQUE: Multidetector CT imaging of the head and neck was performed using the standard protocol during bolus administration of intravenous contrast. Multiplanar CT image reconstructions and MIPs were obtained to evaluate the vascular anatomy. Carotid stenosis measurements (when applicable) are obtained utilizing NASCET criteria, using the distal internal carotid diameter as the denominator. Multiphase CT imaging of the brain was performed following IV bolus contrast injection. Subsequent parametric perfusion maps were calculated using RAPID software. CONTRAST:  166m ISOVUE-370 IOPAMIDOL (ISOVUE-370) INJECTION 76%, <See Chart> ISOVUE-370 IOPAMIDOL (ISOVUE-370) INJECTION 76% COMPARISON:  Prior study from 11/04/2017 FINDINGS: CTA NECK FINDINGS Aortic arch: Visualized aortic arch of normal caliber with normal branch pattern. Scattered atheromatous plaque within the arch and about the visualized great vessels without hemodynamically significant stenosis. Visualized subclavian arteries widely patent. Right carotid system: Right common carotid artery tortuous proximally but widely patent to the bifurcation. Minimal atheromatous plaque about the right bifurcation without significant  stenosis. Right ICA widely patent distally to the skull base without stenosis, dissection, or occlusion. Left carotid system: Left common carotid artery tortuous proximally but widely patent to the bifurcation without stenosis. Eccentric calcified plaque about the left bifurcation without hemodynamically significant stenosis. Left ICA patent distally to the skull base without stenosis, dissection, or occlusion. Vertebral arteries: Both of the vertebral arteries arise from the subclavian arteries. Right vertebral artery dominant. Vertebral arteries tortuous proximally but are widely patent within the neck without stenosis, dissection, or occlusion. Skeleton: No acute osseous abnormality. No discrete lytic or blastic osseous lesions. Mild to moderate cervical spondylolysis at C3-4 through C6-7. Other neck: No acute soft tissue abnormality within the neck. Chronic left maxillary sinusitis noted. Upper chest: Visualized upper chest within normal limits. Partially visualized lungs are grossly clear. Review of the MIP images confirms the above findings CTA HEAD FINDINGS Anterior circulation: Petrous segments widely patent bilaterally. Mild scattered atheromatous plaque throughout the cavernous/supraclinoid ICAs without hemodynamically significant stenosis. ICA termini widely patent. A1 segments widely patent. Normal anterior communicating artery. Anterior cerebral arteries widely patent to their distal aspects. Left M1 widely patent. No proximal left M2 occlusion distal left MCA branches well perfused. Right M1 widely patent to its distal aspect. Subtle 3 mm focal outpouching arising from the distal right M1 segment again seen, suspicious for small aneurysm versus infundibulum (series 7, image 105). No proximal right M2 occlusion. Distal right MCA branches well perfused. Posterior circulation: Atheromatous plaque within the V4 segments bilaterally with secondary mild to moderate stenoses, stable from previous. Right  before dominant. Posterior inferior cerebral arteries patent bilaterally. Basilar artery widely patent to its distal aspect without stenosis. Superior cerebral arteries patent bilaterally. Both of the posterior cerebral arteries primarily supplied via the basilar and are well perfused to their distal aspects. Venous sinuses: Grossly patent. Anatomic variants: None significant. Delayed phase: Not performed. Review of the MIP images confirms the above findings CT Brain Perfusion Findings: CT perfusion failed on this exam as no suitable AIF location was determined. Findings suspected to be related to motion artifact from the patient on this portion of the exam. IMPRESSION: 1. Negative CTA for large vessel occlusion. 2. Failed and nondiagnostic CT perfusion. 3. atheromatous change involving the major arterial vasculature of the head and neck, stable relative to previous CTA from 11/04/2017. Changes most notable within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

## 2018-04-04 NOTE — Progress Notes (Addendum)
STROKE TEAM PROGRESS NOTE  HPI:( Dr Cheral Marker ) Arthella Headings Selk is an 84 y.o. female presenting via EMS after sudden onset of expressive aphasia with right sided weakness resulting in a fall from a standing position onto her right wrist.  LKN was 1645. BP per EMS was 161/75, HR 85 and CBG 157. She has a history of TIAs with recent stroke work up completed in April, per daughter, which was inconclusive. She was started on Plavix at that time. She is not on a blood thinner.  Daughter reports no bleeding problems, history of intracranial hemorrhage or recent operations. LSN: [id number] on 04/03/18 tPA Given: Yes INTERVAL HISTORY Her RN and SLP are at the bedside.  She is pleasant and cooperative with exam.  Mildly demented.she is neurologically improved as per family. Blood pressure is adequate control. MRI scan the brain is pending  Vitals:   04/04/18 0729 04/04/18 0730 04/04/18 0805 04/04/18 0900  BP:  (!) 146/89 (!) 150/85 (!) 141/80  Pulse:  93 80 75  Resp:  (!) 21 (!) 22 (!) 22  Temp: 98.3 F (36.8 C)     TempSrc: Oral     SpO2:  99% 97% 98%  Weight:      Height:        CBC:  Recent Labs  Lab 04/03/18 1751 04/03/18 1816  WBC  --  13.9*  NEUTROABS  --  11.9*  HGB 13.9 13.2  HCT 41.0 40.4  MCV  --  93.1  PLT  --  412    Basic Metabolic Panel:  Recent Labs  Lab 04/03/18 1751 04/03/18 1816  NA 133* 133*  K 3.9 3.6  CL 103 101  CO2  --  20*  GLUCOSE 136* 133*  BUN 15 13  CREATININE 0.80 0.91  CALCIUM  --  10.0   Lipid Panel:     Component Value Date/Time   CHOL 161 04/04/2018 0414   TRIG 93 04/04/2018 0414   HDL 56 04/04/2018 0414   CHOLHDL 2.9 04/04/2018 0414   VLDL 19 04/04/2018 0414   LDLCALC 86 04/04/2018 0414   HgbA1c:  Lab Results  Component Value Date   HGBA1C 4.7 (L) 04/04/2018   Urine Drug Screen:     Component Value Date/Time   LABOPIA NONE DETECTED 11/04/2017 2039   COCAINSCRNUR NONE DETECTED 11/04/2017 2039   LABBENZ POSITIVE (A) 11/04/2017 2039    AMPHETMU NONE DETECTED 11/04/2017 2039   THCU NONE DETECTED 11/04/2017 2039   LABBARB NONE DETECTED 11/04/2017 2039    Alcohol Level No results found for: ETH  IMAGING Ct Angio Head W Or Wo Contrast  Result Date: 04/03/2018 CLINICAL DATA:  Initial evaluation for acute stroke. Right-sided weakness. EXAM: CT ANGIOGRAPHY HEAD AND NECK CT PERFUSION BRAIN TECHNIQUE: Multidetector CT imaging of the head and neck was performed using the standard protocol during bolus administration of intravenous contrast. Multiplanar CT image reconstructions and MIPs were obtained to evaluate the vascular anatomy. Carotid stenosis measurements (when applicable) are obtained utilizing NASCET criteria, using the distal internal carotid diameter as the denominator. Multiphase CT imaging of the brain was performed following IV bolus contrast injection. Subsequent parametric perfusion maps were calculated using RAPID software. CONTRAST:  118mL ISOVUE-370 IOPAMIDOL (ISOVUE-370) INJECTION 76%, <See Chart> ISOVUE-370 IOPAMIDOL (ISOVUE-370) INJECTION 76% COMPARISON:  Prior study from 11/04/2017 FINDINGS: CTA NECK FINDINGS Aortic arch: Visualized aortic arch of normal caliber with normal branch pattern. Scattered atheromatous plaque within the arch and about the visualized great vessels without hemodynamically significant  stenosis. Visualized subclavian arteries widely patent. Right carotid system: Right common carotid artery tortuous proximally but widely patent to the bifurcation. Minimal atheromatous plaque about the right bifurcation without significant stenosis. Right ICA widely patent distally to the skull base without stenosis, dissection, or occlusion. Left carotid system: Left common carotid artery tortuous proximally but widely patent to the bifurcation without stenosis. Eccentric calcified plaque about the left bifurcation without hemodynamically significant stenosis. Left ICA patent distally to the skull base without stenosis,  dissection, or occlusion. Vertebral arteries: Both of the vertebral arteries arise from the subclavian arteries. Right vertebral artery dominant. Vertebral arteries tortuous proximally but are widely patent within the neck without stenosis, dissection, or occlusion. Skeleton: No acute osseous abnormality. No discrete lytic or blastic osseous lesions. Mild to moderate cervical spondylolysis at C3-4 through C6-7. Other neck: No acute soft tissue abnormality within the neck. Chronic left maxillary sinusitis noted. Upper chest: Visualized upper chest within normal limits. Partially visualized lungs are grossly clear. Review of the MIP images confirms the above findings CTA HEAD FINDINGS Anterior circulation: Petrous segments widely patent bilaterally. Mild scattered atheromatous plaque throughout the cavernous/supraclinoid ICAs without hemodynamically significant stenosis. ICA termini widely patent. A1 segments widely patent. Normal anterior communicating artery. Anterior cerebral arteries widely patent to their distal aspects. Left M1 widely patent. No proximal left M2 occlusion distal left MCA branches well perfused. Right M1 widely patent to its distal aspect. Subtle 3 mm focal outpouching arising from the distal right M1 segment again seen, suspicious for small aneurysm versus infundibulum (series 7, image 105). No proximal right M2 occlusion. Distal right MCA branches well perfused. Posterior circulation: Atheromatous plaque within the V4 segments bilaterally with secondary mild to moderate stenoses, stable from previous. Right before dominant. Posterior inferior cerebral arteries patent bilaterally. Basilar artery widely patent to its distal aspect without stenosis. Superior cerebral arteries patent bilaterally. Both of the posterior cerebral arteries primarily supplied via the basilar and are well perfused to their distal aspects. Venous sinuses: Grossly patent. Anatomic variants: None significant. Delayed phase:  Not performed. Review of the MIP images confirms the above findings CT Brain Perfusion Findings: CT perfusion failed on this exam as no suitable AIF location was determined. Findings suspected to be related to motion artifact from the patient on this portion of the exam. IMPRESSION: 1. Negative CTA for large vessel occlusion. 2. Failed and nondiagnostic CT perfusion. 3. atheromatous change involving the major arterial vasculature of the head and neck, stable relative to previous CTA from 11/04/2017. Changes most notable within the bilateral V4 segments were there are mild to moderate stenoses. No other hemodynamically significant or correctable stenosis identified. 4. Stable 3 mm focal outpouching arising from the distal right M1 segment, possibly reflecting a small aneurysm versus vascular infundibulum. 5. Tortuosity of the major arterial vasculature within the neck, suggesting chronic underlying hypertension. These results were communicated to Dr. Cheral Marker at [place] 8/22/2019by text page via the [facility] messaging system. Electronically Signed   By: Jeannine Boga M.D.   On: 04/03/2018 19:19   Dg Forearm Right  Result Date: 04/03/2018 CLINICAL DATA:  84 y/o  F; fall with pain. EXAM: RIGHT FOREARM - 2 VIEW COMPARISON:  None. FINDINGS: Acute impacted and minimally displaced fractures of the distal radius and ulna just proximal to the wrist joint. Elbow and wrist joints are maintained. Bones are demineralized. IMPRESSION: Acute impacted and minimally displaced fractures of the distal radius and ulna just proximal to the wrist joint. Electronically Signed   By:  Kristine Garbe M.D.   On: 04/03/2018 19:44   Dg Wrist Complete Right  Result Date: 04/04/2018 CLINICAL DATA:  Right wrist pain due to a fall 04/03/2018. Initial encounter. EXAM: RIGHT WRIST - COMPLETE 3+ VIEW COMPARISON:  Plain films right forearm 04/03/2018. FINDINGS: The patient is in a fiberglass splint. There is improved position and  alignment of the patient's mildly impacted distal radius and ulna fractures seen on the prior study. Nondisplaced component of the fracture extends through the articular surface at the base of the radial styloid. Bones are osteopenic. No new abnormality. IMPRESSION: Improved position and alignment of distal radius and ulnar fractures as described. No new abnormality. Electronically Signed   By: Inge Rise M.D.   On: 04/04/2018 09:17   Ct Angio Neck W Or Wo Contrast  Result Date: 04/03/2018 CLINICAL DATA:  Initial evaluation for acute stroke. Right-sided weakness. EXAM: CT ANGIOGRAPHY HEAD AND NECK CT PERFUSION BRAIN TECHNIQUE: Multidetector CT imaging of the head and neck was performed using the standard protocol during bolus administration of intravenous contrast. Multiplanar CT image reconstructions and MIPs were obtained to evaluate the vascular anatomy. Carotid stenosis measurements (when applicable) are obtained utilizing NASCET criteria, using the distal internal carotid diameter as the denominator. Multiphase CT imaging of the brain was performed following IV bolus contrast injection. Subsequent parametric perfusion maps were calculated using RAPID software. CONTRAST:  128mL ISOVUE-370 IOPAMIDOL (ISOVUE-370) INJECTION 76%, <See Chart> ISOVUE-370 IOPAMIDOL (ISOVUE-370) INJECTION 76% COMPARISON:  Prior study from 11/04/2017 FINDINGS: CTA NECK FINDINGS Aortic arch: Visualized aortic arch of normal caliber with normal branch pattern. Scattered atheromatous plaque within the arch and about the visualized great vessels without hemodynamically significant stenosis. Visualized subclavian arteries widely patent. Right carotid system: Right common carotid artery tortuous proximally but widely patent to the bifurcation. Minimal atheromatous plaque about the right bifurcation without significant stenosis. Right ICA widely patent distally to the skull base without stenosis, dissection, or occlusion. Left carotid  system: Left common carotid artery tortuous proximally but widely patent to the bifurcation without stenosis. Eccentric calcified plaque about the left bifurcation without hemodynamically significant stenosis. Left ICA patent distally to the skull base without stenosis, dissection, or occlusion. Vertebral arteries: Both of the vertebral arteries arise from the subclavian arteries. Right vertebral artery dominant. Vertebral arteries tortuous proximally but are widely patent within the neck without stenosis, dissection, or occlusion. Skeleton: No acute osseous abnormality. No discrete lytic or blastic osseous lesions. Mild to moderate cervical spondylolysis at C3-4 through C6-7. Other neck: No acute soft tissue abnormality within the neck. Chronic left maxillary sinusitis noted. Upper chest: Visualized upper chest within normal limits. Partially visualized lungs are grossly clear. Review of the MIP images confirms the above findings CTA HEAD FINDINGS Anterior circulation: Petrous segments widely patent bilaterally. Mild scattered atheromatous plaque throughout the cavernous/supraclinoid ICAs without hemodynamically significant stenosis. ICA termini widely patent. A1 segments widely patent. Normal anterior communicating artery. Anterior cerebral arteries widely patent to their distal aspects. Left M1 widely patent. No proximal left M2 occlusion distal left MCA branches well perfused. Right M1 widely patent to its distal aspect. Subtle 3 mm focal outpouching arising from the distal right M1 segment again seen, suspicious for small aneurysm versus infundibulum (series 7, image 105). No proximal right M2 occlusion. Distal right MCA branches well perfused. Posterior circulation: Atheromatous plaque within the V4 segments bilaterally with secondary mild to moderate stenoses, stable from previous. Right before dominant. Posterior inferior cerebral arteries patent bilaterally. Basilar artery widely patent to its  distal aspect  without stenosis. Superior cerebral arteries patent bilaterally. Both of the posterior cerebral arteries primarily supplied via the basilar and are well perfused to their distal aspects. Venous sinuses: Grossly patent. Anatomic variants: None significant. Delayed phase: Not performed. Review of the MIP images confirms the above findings CT Brain Perfusion Findings: CT perfusion failed on this exam as no suitable AIF location was determined. Findings suspected to be related to motion artifact from the patient on this portion of the exam. IMPRESSION: 1. Negative CTA for large vessel occlusion. 2. Failed and nondiagnostic CT perfusion. 3. atheromatous change involving the major arterial vasculature of the head and neck, stable relative to previous CTA from 11/04/2017. Changes most notable within the bilateral V4 segments were there are mild to moderate stenoses. No other hemodynamically significant or correctable stenosis identified. 4. Stable 3 mm focal outpouching arising from the distal right M1 segment, possibly reflecting a small aneurysm versus vascular infundibulum. 5. Tortuosity of the major arterial vasculature within the neck, suggesting chronic underlying hypertension. These results were communicated to Dr. Cheral Marker at [place] 8/22/2019by text page via the [facility] messaging system. Electronically Signed   By: Jeannine Boga M.D.   On: 04/03/2018 19:19   Ct Cerebral Perfusion W Contrast  Result Date: 04/03/2018 CLINICAL DATA:  Initial evaluation for acute stroke. Right-sided weakness. EXAM: CT ANGIOGRAPHY HEAD AND NECK CT PERFUSION BRAIN TECHNIQUE: Multidetector CT imaging of the head and neck was performed using the standard protocol during bolus administration of intravenous contrast. Multiplanar CT image reconstructions and MIPs were obtained to evaluate the vascular anatomy. Carotid stenosis measurements (when applicable) are obtained utilizing NASCET criteria, using the distal internal carotid  diameter as the denominator. Multiphase CT imaging of the brain was performed following IV bolus contrast injection. Subsequent parametric perfusion maps were calculated using RAPID software. CONTRAST:  153mL ISOVUE-370 IOPAMIDOL (ISOVUE-370) INJECTION 76%, <See Chart> ISOVUE-370 IOPAMIDOL (ISOVUE-370) INJECTION 76% COMPARISON:  Prior study from 11/04/2017 FINDINGS: CTA NECK FINDINGS Aortic arch: Visualized aortic arch of normal caliber with normal branch pattern. Scattered atheromatous plaque within the arch and about the visualized great vessels without hemodynamically significant stenosis. Visualized subclavian arteries widely patent. Right carotid system: Right common carotid artery tortuous proximally but widely patent to the bifurcation. Minimal atheromatous plaque about the right bifurcation without significant stenosis. Right ICA widely patent distally to the skull base without stenosis, dissection, or occlusion. Left carotid system: Left common carotid artery tortuous proximally but widely patent to the bifurcation without stenosis. Eccentric calcified plaque about the left bifurcation without hemodynamically significant stenosis. Left ICA patent distally to the skull base without stenosis, dissection, or occlusion. Vertebral arteries: Both of the vertebral arteries arise from the subclavian arteries. Right vertebral artery dominant. Vertebral arteries tortuous proximally but are widely patent within the neck without stenosis, dissection, or occlusion. Skeleton: No acute osseous abnormality. No discrete lytic or blastic osseous lesions. Mild to moderate cervical spondylolysis at C3-4 through C6-7. Other neck: No acute soft tissue abnormality within the neck. Chronic left maxillary sinusitis noted. Upper chest: Visualized upper chest within normal limits. Partially visualized lungs are grossly clear. Review of the MIP images confirms the above findings CTA HEAD FINDINGS Anterior circulation: Petrous segments  widely patent bilaterally. Mild scattered atheromatous plaque throughout the cavernous/supraclinoid ICAs without hemodynamically significant stenosis. ICA termini widely patent. A1 segments widely patent. Normal anterior communicating artery. Anterior cerebral arteries widely patent to their distal aspects. Left M1 widely patent. No proximal left M2 occlusion distal left MCA branches  well perfused. Right M1 widely patent to its distal aspect. Subtle 3 mm focal outpouching arising from the distal right M1 segment again seen, suspicious for small aneurysm versus infundibulum (series 7, image 105). No proximal right M2 occlusion. Distal right MCA branches well perfused. Posterior circulation: Atheromatous plaque within the V4 segments bilaterally with secondary mild to moderate stenoses, stable from previous. Right before dominant. Posterior inferior cerebral arteries patent bilaterally. Basilar artery widely patent to its distal aspect without stenosis. Superior cerebral arteries patent bilaterally. Both of the posterior cerebral arteries primarily supplied via the basilar and are well perfused to their distal aspects. Venous sinuses: Grossly patent. Anatomic variants: None significant. Delayed phase: Not performed. Review of the MIP images confirms the above findings CT Brain Perfusion Findings: CT perfusion failed on this exam as no suitable AIF location was determined. Findings suspected to be related to motion artifact from the patient on this portion of the exam. IMPRESSION: 1. Negative CTA for large vessel occlusion. 2. Failed and nondiagnostic CT perfusion. 3. atheromatous change involving the major arterial vasculature of the head and neck, stable relative to previous CTA from 11/04/2017. Changes most notable within the bilateral V4 segments were there are mild to moderate stenoses. No other hemodynamically significant or correctable stenosis identified. 4. Stable 3 mm focal outpouching arising from the distal  right M1 segment, possibly reflecting a small aneurysm versus vascular infundibulum. 5. Tortuosity of the major arterial vasculature within the neck, suggesting chronic underlying hypertension. These results were communicated to Dr. Cheral Marker at [place] 8/22/2019by text page via the [facility] messaging system. Electronically Signed   By: Jeannine Boga M.D.   On: 04/03/2018 19:19   Ct Head Code Stroke Wo Contrast  Result Date: 04/03/2018 CLINICAL DATA:  Code stroke. Initial evaluation for acute right-sided weakness, speech difficulty. EXAM: CT HEAD WITHOUT CONTRAST TECHNIQUE: Contiguous axial images were obtained from the base of the skull through the vertex without intravenous contrast. COMPARISON:  Prior CT from 11/04/2017 and MRI from 11/05/2017. FINDINGS: Brain: Advanced cerebral atrophy with chronic small vessel ischemic disease, stable. Remote lacunar infarcts present within the hemispheric right cerebral white matter and right thalamus. Few scatter remote bilateral cerebellar infarcts. Small remote left PCA territory infarct. No acute intracranial hemorrhage. No acute large vessel territory infarct. No mass lesion, midline shift or mass effect. No hydrocephalus. Vascular: No hyperdense vessel. Calcified atherosclerosis at the skull base. Skull: Scalp soft tissues and calvarium demonstrate no acute finding. Sinuses/Orbits: Globes and orbital soft tissues within normal limits. Chronic left maxillary sinusitis partially visualized. Paranasal sinuses and mastoid air cells are otherwise clear. Other: None. ASPECTS [facility] Stroke Program Early CT Score) - Ganglionic level infarction (caudate, lentiform nuclei, internal capsule, insula, M1-M3 cortex): 7 - Supraganglionic infarction (M4-M6 cortex): 3 Total score (0-10 with 10 being normal): 10 IMPRESSION: 1. No acute intracranial infarct or other abnormality. 2. ASPECTS is 10. 3. Atrophy with extensive chronic ischemic changes as above, stable from previous.  These results were communicated to Dr. Cheral Marker at 6:26 pmon 8/22/2019by text page via the [facility] messaging system. Electronically Signed   By: Jeannine Boga M.D.   On: 04/03/2018 18:30   EEG  Normal electroencephalogram, awake, drowsy and with activation procedures. There are no focal lateralizing or epileptiform features.  PHYSICAL EXAM Pleasant elderly Caucasian lady not in distress and she has right forearm bandaged due to her radius fracture. . Afebrile. Head is nontraumatic. Neck is supple without bruit.    Cardiac exam no murmur or gallop.  Lungs are clear to auscultation. Distal pulses are well felt. Neurological Exam :  Awake alert slow to respond to questions but slightly hesitant but fluent speech. Able to name and repeat. Follows two-step commands. No dysarthria. Extraocular movements are full range without nystagmus. Blinks to threat bilaterally. Fundi not visualized. Face is symmetric without weakness. Tongue midline. Right upper extremity strength testing limited due to radius fracture but no obvious focal weakness. Moves all 4 extremities symmetrically. Lower extremity extremity mild bilateral hip flexor weakness but symmetric strength. Deep tendon for this symmetric. Sensation is intact. Plantars are downgoing. Gait not tested. ASSESSMENT/PLAN [name] is a 84 y.o. female with history of HTN, HLD, IBS, osteopenia, diverticulosis presenting with sudden onset expressive aphasia and right-sided weakness resulting in fall leading to right radius and ulnar fracture.  IV TPA administered 04/03/2018 at 1823.  Stroke-like symptoms s/p tPA. MRI negative Possible seizure vs Primary Progressive Aphasia - Similar event in March 2019, seen at [facility] with near syncope event at the [employer] followed by diffiidulty getting her words out. MRI neg. EEG neg.   Code Stroke CT head No acute stroke. Small vessel disease. Atrophy. ASPECTS 10.     CTA head & neck no LVO.  Atherosclerosis.  R M1  outpouching (aneurysm versus vascular infundibulum).  Tortuosity of vasculature in the neck suggesting underlying HTN  CT perfusion failed and nondiagnostic  MRI no acute stroke  2D Echo pending  EEG negative for seizure  LDL 86  HgbA1c 4.7  SCDs for VTE prophylaxis  clopidogrel 75 mg daily prior to admission, now on No antithrombotic as within 24 hours of TPA administration.  If imaging negative for hemorrhage, resume Plavix prior to midnight tonight  Therapy recommendations: CIR  Disposition:  pending   Hypertension  Stable  Home medications : Norvasc 5, lisinopril 20  blood pressure goals per post TPA guidelines up to 1823 tonight . Long-term BP goal normotensive  Hyperlipidemia  Home meds: Pravachol 20, resumed in hospital  LDL 86, goal < 70  Continue statin at discharge  Other Stroke Risk Factors  Advanced age  Hx stroke/TIA  10/2017 -near syncope event with difficulty getting her words out.  Seen at [facility].  MRI negative.  EEG negative.  Seizure versus TIA.  Started on Plavix  06/2014 right arm weakness. TIA. MRI neg. Placed on aspirin 325  Family hx stroke (father)  Other Active Problems  Distal R radius and ulnar fx following fall. Splint in place. Ortho on board [facility])  Leukocytosis 13.9, likely related to arm fracture  Hospital day # [place], MSN, APRN, ANVP-BC, AGPCNP-BC Advanced Practice Stroke Nurse [place] for Schedule & Pager information 04/04/2018 3:04 PM  I have personally examined this patient, reviewed notes, independently viewed imaging studies, participated in medical decision making and plan of care.ROS completed by me personally and pertinent positives fully documented  I have made any additions or clarifications directly to the above note. Agree with note above. She has presented with the second episode of strokelike presentation with negative brain imaging and differential of the episode includes  seizure versus primary progressive aphasia given my baseline dementia. Recommend a trial of Keppra for seizures. Long discussion of the bedside with the patient's daughter and answered questions about her care. Recommend close blood pressure monitoring and strict neurological follow-up as per post TPA protocol.She will need outpatient follow-up with orthopedic surgery for her radius fractureThis patient is critically ill and  at significant risk of neurological worsening, death and care requires constant monitoring of vital signs, hemodynamics,respiratory and cardiac monitoring, extensive review of multiple databases, frequent neurological assessment, discussion with family, other specialists and medical decision making of high complexity.I have made any additions or clarifications directly to the above note.This critical care time does not reflect procedure time, or teaching time or supervisory time of PA/NP/Med Resident etc but could involve care discussion time.  I spent 30 minutes of neurocritical care time  in the care of  this patient.      Antony Contras, MD Medical Director [facility] Pager: [phone] 04/04/2018 5:19 PM  To contact Stroke Continuity provider, please refer to [email or web address] After hours, contact General Neurology

## 2018-04-04 NOTE — Evaluation (Signed)
Speech Language Pathology Evaluation Patient Details Name: Kellie Harris MRN: [id number] DOB: 10-Aug-1931 Today's Date: 04/04/2018 Time: 6644-0347 SLP Time Calculation (min) (ACUTE ONLY): 22 min  Problem List:  Patient Active Problem List   Diagnosis Date Noted  . Stroke (cerebrum) ([place]) 04/03/2018  . Syncope   . TIA (transient ischemic attack) 11/04/2017  . Malnutrition of moderate degree 08/26/2017  . Total knee replacement status 08/07/2017  . Closed left hip fracture ([place]) 02/12/2017  . HTN (hypertension) 02/12/2017  . HLD (hyperlipidemia) 02/12/2017   Past Medical History:  Past Medical History:  Diagnosis Date  . Arthritis    right knee, left shoulder/ OSTEO  . Cancer ([place])    skin   . Degenerative disc disease, lumbar    L4-L5  . Diverticulosis    ITIS  . History of hiatal hernia   . Hydronephrosis    right  . Hypercholesteremia   . Hypertension    CONTROLLED ON MEDS  . IBS (irritable bowel syndrome)   . Knee fracture, right 03/05/2015   DIFFICULTY WITH AMBULATION  . Osteopenia   . Ovarian cyst   . Pancreatic insufficiency   . Wears hearing aid    bilateral   Past Surgical History:  Past Surgical History:  Procedure Laterality Date  . CATARACT EXTRACTION W/PHACO Right 11/02/2015   Procedure: CATARACT EXTRACTION PHACO AND INTRAOCULAR LENS PLACEMENT (IOC);  Surgeon: Leandrew Koyanagi, MD;  Location: [place];  Service: Ophthalmology;  Laterality: Right;  . CATARACT EXTRACTION W/PHACO Left 07/18/2016   Procedure: CATARACT EXTRACTION PHACO AND INTRAOCULAR LENS PLACEMENT (IOC);  Surgeon: Leandrew Koyanagi, MD;  Location: [place];  Service: Ophthalmology;  Laterality: Left;  . COLONOSCOPY    . FRACTURE SURGERY Left    hip  . INTRAMEDULLARY (IM) NAIL INTERTROCHANTERIC Left 02/12/2017   Procedure: INTRAMEDULLARY (IM) NAIL INTERTROCHANTRIC;  Surgeon: Earnestine Leys, MD;  Location: [facility];  Service: Orthopedics;  Laterality: Left;  . kidney  stent    . OOPHORECTOMY Bilateral 2015  . TONSILLECTOMY    . TOTAL KNEE ARTHROPLASTY Right 08/07/2017   Procedure: TOTAL KNEE ARTHROPLASTY;  Surgeon: Earnestine Leys, MD;  Location: [facility];  Service: Orthopedics;  Laterality: Right;   HPI:  Pt is an 84 yo female who presented with acute onset aphasia and R sided weakness, which resulted in a fall on her R wrist with distal radius and ulna fx.  CT head negative; MRI pending. Pt is s/p tPA. Pt had a similar episode in March 2019, at which time she was seen by SLP with no signs of cognitive-linguistic impairment when accounting for pt's difficulty hearing. PMH: HTN, HH, skin cancer, arthritis   Assessment / Plan / Recommendation Clinical Impression  Pt appears to have made significant improvement from initial presentation, although perhaps mildly declined from previously documented SLP evaluation in March 2019. At that time she had no evidence of cognitive-linguistic deficits. Today she is oriented x2, needs Min cues to follow one-step commands, and she has difficulty recalling daily events and new information, asking certain questions more repetitively. No family is present to determine what her baseline has been since her last admission. Recommend additional, likely brief SLP f/u as medical w/u is still underway to maximize cognitive function and safety.    SLP Assessment  SLP Recommendation/Assessment: Patient needs continued Speech Lanaguage Pathology Services SLP Visit Diagnosis: Cognitive communication deficit (R41.841)    Follow Up Recommendations  24 hour supervision/assistance;Other (comment)(tba)    Frequency and Duration min 2x/week  1  week      SLP Evaluation Cognition  Overall Cognitive Status: No family/caregiver present to determine baseline cognitive functioning Arousal/Alertness: Awake/alert Orientation Level: Oriented to person;Oriented to place;Disoriented to time;Disoriented to situation Attention: Selective Selective  Attention: Impaired Selective Attention Impairment: Verbal basic Memory: Impaired Memory Impairment: Decreased recall of new information Awareness: Impaired Awareness Impairment: Intellectual impairment       Comprehension  Auditory Comprehension Overall Auditory Comprehension: Impaired Commands: Impaired One Step Basic Commands: 75-100% accurate Conversation: Simple Interfering Components: Hearing    Expression Expression Primary Mode of Expression: Verbal Verbal Expression Overall Verbal Expression: Appears within functional limits for tasks assessed   Oral / Motor  Oral Motor/Sensory Function Overall Oral Motor/Sensory Function: Within functional limits Motor Speech Overall Motor Speech: Appears within functional limits for tasks assessed(? mildly imprecise but clear intelligibility)   GO                    Germain Osgood 04/04/2018, 11:53 AM   Germain Osgood, M.A. CCC-SLP [phone]

## 2018-04-04 NOTE — Progress Notes (Signed)
PT Cancellation Note  Patient Details Name: Kellie [facility] MRN: [id number] DOB: October 02, 1930   Cancelled Treatment:    Reason Eval/Treat Not Completed: Active bedrest order.  PT will check back later today or tomorrow to see if bedrest has been lifted.   Thanks,    Barbarann Ehlers. [place], [place], DPT [phone]   04/04/2018, 9:22 AM

## 2018-04-04 NOTE — Progress Notes (Signed)
Rehab Admissions Coordinator Note:  Per PT recommendation, Patient was screened by Jhonnie Garner for appropriateness for an Inpatient Acute Rehab Consult.  At this time, we are recommending Inpatient Rehab consult. AC will contact MD regarding IP rehab consult order. Please call if questions.   Jhonnie Garner 04/04/2018, 2:54 PM  I can be reached at [phone].

## 2018-04-04 NOTE — Consult Note (Signed)
Physical Medicine and Rehabilitation Consult Reason for Consult: Right side weakness and aphasia Referring Physician: Dr. Leonie Man   HPI: Kellie Harris is a 84 y.o. right-handed female with history of previous CVA maintained on Plavix April 2019.  Per chart review and patient, patient lives alone reported to be independent with assistive device prior to admission.  One level home with 2 steps to entry.  Presented 04/03/2018 with acute onset of expressive aphasia and right-sided weakness as well as noted fall.  Cranial CT scan reviewed, unremarkable for acute intracranial process.  Patient did receive TPA.  CT angiogram of head and neck negative for large vessel occlusion.  MRI of the brain unremarkable for acute intracranial process.  EEG negative. Echocardiogram pending.  Neurology consulted with work-up presently ongoing.  Tolerating a regular diet.  X-rays of right distal radius and ulna revealed fracture and placed in a sugar tong splint per orthopedic services Dr. Marcelino Scot.  Plan for surgical intervention on Tuesday.  Currently nonweightbearing to the right wrist.  Therapy evaluation completed with recommendations of physical medicine rehab consult.   Review of Systems  Constitutional: Negative for fever.  HENT: Negative for hearing loss.   Eyes: Negative for blurred vision and double vision.  Respiratory: Negative for cough and shortness of breath.   Cardiovascular: Negative for chest pain and palpitations.  Gastrointestinal: Positive for constipation. Negative for nausea.  Genitourinary: Negative for dysuria, flank pain and hematuria.  Musculoskeletal: Positive for joint pain and myalgias.  Skin: Negative for rash.  Neurological: Positive for speech change, focal weakness and headaches.  All other systems reviewed and are negative.  Past Medical History:  Diagnosis Date  . Arthritis    right knee, left shoulder/ OSTEO  . Cancer ([place])    skin   . Degenerative disc disease, lumbar      L4-L5  . Diverticulosis    ITIS  . History of hiatal hernia   . Hydronephrosis    right  . Hypercholesteremia   . Hypertension    CONTROLLED ON MEDS  . IBS (irritable bowel syndrome)   . Knee fracture, right 03/05/2015   DIFFICULTY WITH AMBULATION  . Osteopenia   . Ovarian cyst   . Pancreatic insufficiency   . Wears hearing aid    bilateral   Past Surgical History:  Procedure Laterality Date  . CATARACT EXTRACTION W/PHACO Right 11/02/2015   Procedure: CATARACT EXTRACTION PHACO AND INTRAOCULAR LENS PLACEMENT (IOC);  Surgeon: Leandrew Koyanagi, MD;  Location: [place];  Service: Ophthalmology;  Laterality: Right;  . CATARACT EXTRACTION W/PHACO Left 07/18/2016   Procedure: CATARACT EXTRACTION PHACO AND INTRAOCULAR LENS PLACEMENT (IOC);  Surgeon: Leandrew Koyanagi, MD;  Location: [place];  Service: Ophthalmology;  Laterality: Left;  . COLONOSCOPY    . FRACTURE SURGERY Left    hip  . INTRAMEDULLARY (IM) NAIL INTERTROCHANTERIC Left 02/12/2017   Procedure: INTRAMEDULLARY (IM) NAIL INTERTROCHANTRIC;  Surgeon: Earnestine Leys, MD;  Location: [facility];  Service: Orthopedics;  Laterality: Left;  . kidney stent    . OOPHORECTOMY Bilateral 2015  . TONSILLECTOMY    . TOTAL KNEE ARTHROPLASTY Right 08/07/2017   Procedure: TOTAL KNEE ARTHROPLASTY;  Surgeon: Earnestine Leys, MD;  Location: [facility];  Service: Orthopedics;  Laterality: Right;   Family History  Problem Relation Age of Onset  . Breast cancer Mother 25  . Leukemia Mother   . Stroke Father    Social History:  reports that she has never smoked. She has never  used smokeless tobacco. She reports that she does not drink alcohol or use drugs. Allergies: No Known Allergies Medications Prior to Admission  Medication Sig Dispense Refill  . acetaminophen (TYLENOL) 500 MG tablet Take 500-1,000 mg by mouth every 6 (six) hours as needed (for pain or headaches).    [name] Kitchen amLODipine (NORVASC) 5 MG tablet Take 5 mg by  mouth daily.    [name] Kitchen amoxicillin (AMOXIL) 500 MG capsule Take 2,000 mg by mouth See admin instructions. Take 2,000 mg by mouth one hour prior to dental procedures    . clopidogrel (PLAVIX) 75 MG tablet Take 75 mg by mouth daily.    [name] Kitchen lisinopril (PRINIVIL,ZESTRIL) 20 MG tablet Take 20 mg by mouth 2 (two) times daily.    . pravastatin (PRAVACHOL) 20 MG tablet Take 1 tablet (20 mg total) by mouth at bedtime. 30 tablet 3  . Probiotic CAPS Take 1 capsule by mouth daily as needed (when IBS flares).    . gabapentin (NEURONTIN) 100 MG capsule Take 1 capsule (100 mg total) by mouth 3 (three) times daily. (Patient not taking: Reported on 04/03/2018) 60 capsule 3    Home: Home Living Family/patient expects to be discharged to:: Private residence Living Arrangements: Alone Available Help at Discharge: Family, Available PRN/intermittently Type of Home: House Home Access: Stairs to enter [employer] of Steps: 2 Entrance Stairs-Rails: None Home Layout: One level Bathroom Shower/Tub: Chiropodist: Handicapped height [place]: Environmental consultant - 2 wheels, [employer] - single point, Environmental consultant - 4 wheels, Shower seat, Grab bars - tub/shower, Hand held shower head, Other (comment) Additional Comments: daughter present at eval and reports neighbor was with her at the time of the event  Functional History: Prior Function Level of Independence: Independent with assistive device(s), Needs assistance Gait / Transfers Assistance Needed: uses RW (standard) in house for gait.  Keeps another RW (rollator) in her car for community access, and was using her cane when she had the stroke and fell (taking her garbage out).  ADL's / Homemaking Assistance Needed: does her own bathing, dressing, "what housework that is done" and cooking.  She still drives despite recommendations and "[place] not like to [place] [address]".   Communication / Swallowing Assistance Needed: HOH, see also SLP note Comments: Pt reports  she's been using RW at all times Functional Status:  Mobility: Bed Mobility Overal bed mobility: Needs Assistance Bed Mobility: Supine to Sit, Sit to Supine Supine to sit: Min assist Sit to supine: Min assist General bed mobility comments: needed A for RUE Transfers Overall transfer level: Needs assistance Equipment used: 1 person hand held assist Transfers: Sit to/from Stand, Stand Pivot Transfers Sit to Stand: Min assist Stand pivot transfers: Min assist General transfer comment: hand held (A) and cues to avoid pushing with R UE Ambulation/Gait Ambulation/Gait assistance: Mod assist Gait Distance (Feet): 10 Feet Assistive device: 1 person hand held assist Gait Pattern/deviations: Step-through pattern, Staggering left, Staggering right General Gait Details: Pt with uncoordinated gait pattern, some functional weakness noted in her right LE.  Assist at trunk mostly for balance.     ADL: ADL Overall ADL's : Needs assistance/impaired Eating/Feeding: Maximal assistance Grooming: Maximal assistance Upper Body Bathing: Maximal assistance Lower Body Bathing: Maximal assistance Upper Body Dressing : Maximal assistance Lower Body Dressing: Maximal assistance Toilet Transfer: Minimal assistance, Stand-pivot, BSC Toileting- Clothing Manipulation and Hygiene: Minimal assistance, Sit to/from stand Functional mobility during ADLs: Moderate assistance General ADL Comments: pt with hand held (A) to the bathroom  for [facility] use over toilet. pt able to complete hygiene static sitting on commode.   Cognition: Cognition Overall Cognitive Status: Impaired/Different from baseline Arousal/Alertness: Awake/alert Orientation Level: Oriented X4 Attention: Selective Selective Attention: Impaired Selective Attention Impairment: Verbal basic Memory: Impaired Memory Impairment: Decreased recall of new information Awareness: Impaired Awareness Impairment: Intellectual  impairment Cognition Arousal/Alertness: Awake/alert Behavior During Therapy: WFL for tasks assessed/performed Overall Cognitive Status: Impaired/Different from baseline Area of Impairment: Orientation, Attention, Memory, Following commands, Safety/judgement, Problem solving, Awareness Orientation Level: Disoriented to, Place, Time, Situation Current Attention Level: Sustained Memory: Decreased short-term memory Following Commands: Follows one step commands consistently Safety/Judgement: Decreased awareness of safety, Decreased awareness of deficits Awareness: Intellectual Problem Solving: Difficulty sequencing, Requires verbal cues, Requires tactile cues General Comments: Pt reports that it is April, "they say I am at [facility]", she does not remember the event, who called 911 or the ambulance ride.  She did not know anything about a stroke, and she told me it was 54.  She seems to get some details confused and/or insert wrong words at times. She is HOH which does not help.   Blood pressure (!) 147/89, pulse 91, temperature 98.9 F (37.2 C), temperature source Oral, resp. rate 16, height 4\' 11"  (1.499 m), weight 44.7 kg, SpO2 (!) 8 %. Physical Exam  Vitals reviewed. Constitutional: She appears well-developed.  Frail  HENT:  Left periorbital edema and ecchymosis  Eyes: EOM are normal. Right eye exhibits no discharge. Left eye exhibits no discharge.  Neck: Normal range of motion. Neck supple. No thyromegaly present.  Cardiovascular: Normal rate, regular rhythm and normal heart sounds.  Respiratory: Effort normal and breath sounds normal. No respiratory distress.  GI: Soft. Bowel sounds are normal. She exhibits no distension.  Musculoskeletal:  No edema or tenderness in extremities  Neurological: She is alert.  Makes good eye contact with examiner.   She can provide her name and age.   Some delay in processing.   Follows simple commands. Motor: RUE: shoulder abduction 3/5, distally  limited by brace and pain LUE: 4-4+/5 proximal to distal B/l LE: 4+/5 proximal to distal  Skin:  RUE splint in place. Scattered hematomas  Psychiatric: She has a normal mood and affect. Her behavior is normal.    Results for orders placed or performed during the hospital encounter of 04/03/18 (from the past 24 hour(s))  Basic metabolic panel     Status: Abnormal   Collection Time: 04/05/18  4:09 AM  Result Value Ref Range   Sodium 137 135 - 145 mmol/L   Potassium 3.3 (L) 3.5 - 5.1 mmol/L   Chloride 104 98 - 111 mmol/L   CO2 21 (L) 22 - 32 mmol/L   Glucose, Bld 97 70 - 99 mg/dL   BUN 10 8 - 23 mg/dL   Creatinine, Ser 0.80 0.44 - 1.00 mg/dL   Calcium 9.6 8.9 - 10.3 mg/dL   GFR calc non Af Amer >60 >60 mL/min   GFR calc Af Amer >60 >60 mL/min   Anion gap 12 5 - 15  CBC     Status: Abnormal   Collection Time: 04/05/18  4:09 AM  Result Value Ref Range   WBC 10.7 (H) 4.0 - 10.5 K/uL   RBC 4.01 3.87 - 5.11 MIL/uL   Hemoglobin 12.2 12.0 - 15.0 g/dL   HCT 37.5 36.0 - 46.0 %   MCV 93.5 78.0 - 100.0 fL   MCH 30.4 26.0 - 34.0 pg   MCHC 32.5 30.0 - 36.0  g/dL   RDW 13.2 11.5 - 15.5 %   Platelets 180 150 - 400 K/uL   Dg Wrist Complete Right  Result Date: 04/04/2018 CLINICAL DATA:  Right wrist pain due to a fall 04/03/2018. Initial encounter. EXAM: RIGHT WRIST - COMPLETE 3+ VIEW COMPARISON:  Plain films right forearm 04/03/2018. FINDINGS: The patient is in a fiberglass splint. There is improved position and alignment of the patient's mildly impacted distal radius and ulna fractures seen on the prior study. Nondisplaced component of the fracture extends through the articular surface at the base of the radial styloid. Bones are osteopenic. No new abnormality. IMPRESSION: Improved position and alignment of distal radius and ulnar fractures as described. No new abnormality. Electronically Signed   By: Inge Rise M.D.   On: 04/04/2018 09:17   Mr Brain Wo Contrast  Result Date:  04/04/2018 CLINICAL DATA:  Follow-up examination for acute stroke. Right-sided weakness, aphasia, status post tPA. EXAM: MRI HEAD WITHOUT CONTRAST TECHNIQUE: Multiplanar, multiecho pulse sequences of the brain and surrounding structures were obtained without intravenous contrast. COMPARISON:  Prior CTA from 04/03/2018. FINDINGS: Brain: Generalized age-related cerebral atrophy with moderate chronic small vessel ischemic disease. Remote lacunar infarcts seen involving the bilateral thalami and right basal ganglia/hemispheric cerebral white matter. Few small remote bilateral cerebellar infarcts. No abnormal foci of restricted diffusion to suggest acute or subacute ischemia. Gray-white matter differentiation maintained. No areas of remote cortical infarction. No acute or chronic intracranial hemorrhage. No mass lesion, midline shift or mass effect. No hydrocephalus. No extra-axial fluid collection. Normal pituitary gland. Vascular: Major intracranial vascular flow voids maintained. Skull and upper cervical spine: Craniocervical junction within normal limits. Multilevel cervical spondylolysis noted within the upper cervical spine. Bone marrow signal intensity within normal limits. No scalp soft tissue abnormality. Sinuses/Orbits: Patient status post ocular lens replacement bilaterally. Globes and orbital soft tissues demonstrate no acute finding. Chronic left maxillary sinusitis. No mastoid effusion. Inner ear structures normal. Other: None. IMPRESSION: 1. No acute intracranial infarct or other abnormality identified. 2. Moderately advanced cerebral atrophy with chronic small vessel ischemic disease with scattered remote infarcts as above. Electronically Signed   By: Jeannine Boga M.D.   On: 04/04/2018 15:29    Assessment/Plan: Diagnosis: Weakness Labs and images (see above) independently reviewed.  Records reviewed and summated above.  1. Does the need for close, 24 hr/day medical supervision in concert  with the patient's rehab needs make it unreasonable for this patient to be served in a less intensive setting? Potentially  2. Co-Morbidities requiring supervision/potential complications: previous CVA, right arm fracture (plan for surgery on Tuesday), HTN (monitor and provide prns in accordance with increased physical exertion and pain), Tachycardia (monitor in accordance with pain and increasing activity), hypokalemia (continue to monitor and replete as necessary), leukocytosis (cont to monitor for signs and symptoms of infection, further workup if indicated), seizures (cont meds) 3. Due to safety, skin/wound care, disease management, pain management and patient education, does the patient require 24 hr/day rehab nursing? Potentially 4. Does the patient require coordinated care of a physician, rehab nurse, PT (1-2 hrs/day, 5 days/week) and OT (1-2 hrs/day, 5 days/week) to address physical and functional deficits in the context of the above medical diagnosis(es)? Potentially Addressing deficits in the following areas: balance, endurance, locomotion, strength, transferring, bathing, dressing, toileting and psychosocial support 5. Can the patient actively participate in [facility] intensive therapy program of at least 3 hrs of therapy per day at least 5 days per week? Potentially 6. The potential for  patient to make measurable gains while on inpatient rehab is excellent and good 7. Anticipated functional outcomes upon discharge from inpatient rehab are min assist  with PT, min assist with OT, n/a with SLP. 8. Estimated rehab length of stay to reach the above functional goals is: 17-20 days. 9. Anticipated D/C setting: Other 10. Anticipated post D/C treatments: SNF 11. Overall Rehab/Functional Prognosis: excellent  RECOMMENDATIONS: This patient's condition is appropriate for continued rehabilitative care in the following setting: Will need to await therapy evaluation after surgical intervention. Will also await  completion of Neurological workup.  However, in either case, patient will likely require assistance at discharge. Recommend SNF at this point given working diagnosis. Patient has agreed to participate in recommended program. Potentially Note that insurance prior authorization may be required for reimbursement for recommended care.  Comment: Rehab Admissions Coordinator to follow up.   I have personally performed a face to face diagnostic evaluation, including, but not limited to relevant history and physical exam findings, of this patient and developed relevant assessment and plan.  Additionally, I have reviewed and concur with the physician assistant's documentation above.   Delice Lesch, MD, ABPMR Lavon Paganini Angiulli, PA-C 04/05/2018

## 2018-04-05 ENCOUNTER — Other Ambulatory Visit (HOSPITAL_COMMUNITY): Payer: PPO

## 2018-04-05 DIAGNOSIS — R000 Tachycardia, unspecified: Secondary | ICD-10-CM | POA: Diagnosis present

## 2018-04-05 DIAGNOSIS — S52501A Unspecified fracture of the lower end of right radius, initial encounter for closed fracture: Secondary | ICD-10-CM | POA: Diagnosis present

## 2018-04-05 DIAGNOSIS — D72829 Elevated white blood cell count, unspecified: Secondary | ICD-10-CM | POA: Diagnosis present

## 2018-04-05 DIAGNOSIS — T148XXA Other injury of unspecified body region, initial encounter: Secondary | ICD-10-CM

## 2018-04-05 DIAGNOSIS — Z8673 Personal history of transient ischemic attack (TIA), and cerebral infarction without residual deficits: Secondary | ICD-10-CM | POA: Insufficient documentation

## 2018-04-05 DIAGNOSIS — E876 Hypokalemia: Secondary | ICD-10-CM | POA: Diagnosis present

## 2018-04-05 DIAGNOSIS — I10 Essential (primary) hypertension: Secondary | ICD-10-CM | POA: Diagnosis present

## 2018-04-05 DIAGNOSIS — R569 Unspecified convulsions: Secondary | ICD-10-CM

## 2018-04-05 DIAGNOSIS — G40909 Epilepsy, unspecified, not intractable, without status epilepticus: Secondary | ICD-10-CM

## 2018-04-05 DIAGNOSIS — E785 Hyperlipidemia, unspecified: Secondary | ICD-10-CM

## 2018-04-05 LAB — CBC
HEMATOCRIT: 37.5 % (ref 36.0–46.0)
HEMOGLOBIN: 12.2 g/dL (ref 12.0–15.0)
MCH: 30.4 pg (ref 26.0–34.0)
MCHC: 32.5 g/dL (ref 30.0–36.0)
MCV: 93.5 fL (ref 78.0–100.0)
Platelets: 180 10*3/uL (ref 150–400)
RBC: 4.01 MIL/uL (ref 3.87–5.11)
RDW: 13.2 % (ref 11.5–15.5)
WBC: 10.7 10*3/uL — ABNORMAL HIGH (ref 4.0–10.5)

## 2018-04-05 LAB — BASIC METABOLIC PANEL
Anion gap: 12 (ref 5–15)
BUN: 10 mg/dL (ref 8–23)
CHLORIDE: 104 mmol/L (ref 98–111)
CO2: 21 mmol/L — AB (ref 22–32)
Calcium: 9.6 mg/dL (ref 8.9–10.3)
Creatinine, Ser: 0.8 mg/dL (ref 0.44–1.00)
GFR calc Af Amer: >60 mL/min (ref >60)
GLUCOSE: 97 mg/dL (ref 70–99)
POTASSIUM: 3.3 mmol/L — AB (ref 3.5–5.1)
Sodium: 137 mmol/L (ref 135–145)

## 2018-04-05 MED ORDER — POTASSIUM CHLORIDE CRYS ER 20 MEQ PO TBCR
40.0000 meq | EXTENDED_RELEASE_TABLET | ORAL | Status: AC
  Administered 2018-04-05 (×2): 40 meq via ORAL
  Filled 2018-04-05 (×2): qty 2

## 2018-04-05 MED ORDER — PANTOPRAZOLE SODIUM 40 MG PO TBEC
40.0000 mg | DELAYED_RELEASE_TABLET | Freq: Every day | ORAL | Status: DC
  Administered 2018-04-05 – 2018-04-10 (×6): 40 mg via ORAL
  Filled 2018-04-05 (×6): qty 1

## 2018-04-05 NOTE — Progress Notes (Signed)
Occupational Therapy Treatment Patient Details Name: Kellie Harris MRN: [id number] DOB: 1930/11/21 Today's Date: 04/05/2018    History of present illness 84 y.o. female admitted on 04/03/18 for acute aphasia and R sided weakness.  CT was negative, so tPA given.  MRI is pending. Suspected L hemisphere stroke.  Pt also fell sustaining a R wrist fx due to have ORIF on 04/08/18 and is NWB pre op and post op.  Pt with significant PMH of HOH (wears hearing aids), osteopenia, R knee fx, HTN, DDD (lumbar), R TKA, IM nail L hip, and bil cataract surgery.   OT comments  This 84 yo female admitted with above presents to acute with increased bed mobility and transfers with still painful RUE but not swollen today and moving fingers well. She will continue to benefit from acute OT with follow up on CIR.   Follow Up Recommendations  CIR    Equipment Recommendations  Other (comment)(TBD next venue)    Recommendations for Other Services Rehab consult    Precautions / Restrictions Precautions Precautions: Fall Precaution Comments: BP restrictions no >180/>105 Required Braces or Orthoses: Sling;Other Brace/Splint Other Brace/Splint: sling when up, carter positioning foam when in bed Restrictions Weight Bearing Restrictions: Yes RUE Weight Bearing: Non weight bearing       Mobility Bed Mobility Overal bed mobility: Needs Assistance Bed Mobility: Supine to Sit;Sit to Supine     Supine to sit: Min assist Sit to supine: Min assist   General bed mobility comments: needed A for RUE  Transfers Overall transfer level: Needs assistance Equipment used: 1 person hand held assist Transfers: Sit to/from [facility] Sit to Stand: Min assist Stand pivot transfers: Min assist            Balance Overall balance assessment: Needs assistance Sitting-balance support: No upper extremity supported;Feet supported Sitting balance-Leahy Scale: Fair     Standing balance support: Single  extremity supported Standing balance-Leahy Scale: Poor                             ADL either performed or assessed with clinical judgement   ADL Overall ADL's : Needs assistance/impaired                         Toilet Transfer: Minimal assistance;Stand-pivot;BSC   Toileting- Clothing Manipulation and Hygiene: Minimal assistance;Sit to/from stand               Vision Patient Visual Report: No change from baseline            Cognition Arousal/Alertness: Awake/alert Behavior During Therapy: WFL for tasks assessed/performed                                            Exercises Other Exercises Other Exercises: Pt also seen for splint check and positioning for RUE. Pt in bed upon arrival with RUE propped on one pillow. Had bluish (looked more like bruising) and cold (but LUE was also). No swelling in RUE and pt able to move fingers. No c/o of any pain/tightness from sugar tong splint. Placed pt in carter foam positioner to support RUE and provide increased elevation. Encouraged her to continue to move her fingers while in positioner           Pertinent Vitals/ Pain  Pain Assessment: 0-10 Pain Score: 5  Pain Location: R wrist with movement of RUE Pain Descriptors / Indicators: Grimacing;Guarding Pain Intervention(s): Monitored during session;Repositioned;Patient requesting pain meds-RN notified         Frequency  Min 3X/week        Progress Toward Goals  OT Goals(current goals can now be found in the care plan section)  Progress towards OT goals: Progressing toward goals     Plan Discharge plan remains appropriate       AM-PAC PT "6 Clicks" Daily Activity     Outcome Measure   Help from another person eating meals?: A Lot Help from another person taking care of personal grooming?: A Lot Help from another person toileting, which includes using toliet, bedpan, or urinal?: A Lot Help from another person bathing  (including washing, rinsing, drying)?: A Lot Help from another person to put on and taking off regular upper body clothing?: A Lot Help from another person to put on and taking off regular lower body clothing?: A Lot 6 Click Score: 12    End of Session    OT Visit Diagnosis: Unsteadiness on feet (R26.81);Pain Pain - Right/Left: Left Pain - part of body: Arm   Activity Tolerance Patient tolerated treatment well   Patient Left in bed;with call bell/phone within reach;with bed alarm set   Nurse Communication Patient requests pain meds        Time: 1411-1434 OT Time Calculation (min): 23 min  Charges: OT General Charges $OT Visit: 1 Visit OT Treatments $Self Care/Home Management : 8-22 mins $Orthotics/Prosthetics Check: 8-22 mins   [place], OTR/L [phone] 04/05/2018

## 2018-04-05 NOTE — Progress Notes (Signed)
STROKE TEAM PROGRESS NOTE   INTERVAL HISTORY Her daughter is at the bedside.  She is pleasant and cooperative with exam.  Right wrist with splint.  MRI no acute infarct.  Orthopedics on board and plan for surgery on Tuesday.  Vitals:   04/05/18 0353 04/05/18 0400 04/05/18 0820 04/05/18 1149  BP: (!) 146/109  (!) 157/79 (!) 160/82  Pulse: 73  82 91  Resp:   18 18  Temp: 98.6 F (37 C)  98.1 F (36.7 C) 98.6 F (37 C)  TempSrc: Oral  Oral Oral  SpO2: (!) 68% 92% 99% 98%  Weight:      Height:        CBC:  Recent Labs  Lab 04/03/18 1816 04/05/18 0409  WBC 13.9* 10.7*  NEUTROABS 11.9*  --   HGB 13.2 12.2  HCT 40.4 37.5  MCV 93.1 93.5  PLT 249 315    Basic Metabolic Panel:  Recent Labs  Lab 04/03/18 1816 04/05/18 0409  NA 133* 137  K 3.6 3.3*  CL 101 104  CO2 20* 21*  GLUCOSE 133* 97  BUN 13 10  CREATININE 0.91 0.80  CALCIUM 10.0 9.6   Lipid Panel:     Component Value Date/Time   CHOL 161 04/04/2018 0414   TRIG 93 04/04/2018 0414   HDL 56 04/04/2018 0414   CHOLHDL 2.9 04/04/2018 0414   VLDL 19 04/04/2018 0414   LDLCALC 86 04/04/2018 0414   HgbA1c:  Lab Results  Component Value Date   HGBA1C 4.7 (L) 04/04/2018   Urine Drug Screen:     Component Value Date/Time   LABOPIA NONE DETECTED 11/04/2017 2039   COCAINSCRNUR NONE DETECTED 11/04/2017 2039   LABBENZ POSITIVE (A) 11/04/2017 2039   AMPHETMU NONE DETECTED 11/04/2017 2039   THCU NONE DETECTED 11/04/2017 2039   LABBARB NONE DETECTED 11/04/2017 2039    Alcohol Level No results found for: ETH   IMAGING  Ct Angio Head W Or Wo Contrast Ct Angio Neck W Or Wo Contrast Ct Cerebral Perfusion W Contrast 04/03/2018 IMPRESSION:  1. Negative CTA for large vessel occlusion.  2. Failed and nondiagnostic CT perfusion.  3. Atheromatous change involving the major arterial vasculature of the head and neck, stable relative to previous CTA from 11/04/2017. Changes most notable within the bilateral V4 segments  were there are mild to moderate stenoses. No other hemodynamically significant or correctable stenosis identified.  4. Stable 3 mm focal outpouching arising from the distal right M1 segment, possibly reflecting a small aneurysm versus vascular infundibulum.  5. Tortuosity of the major arterial vasculature within the neck, suggesting chronic underlying hypertension.   Mr Brain Wo Contrast 04/04/2018 IMPRESSION:  1. No acute intracranial infarct or other abnormality identified.  2. Moderately advanced cerebral atrophy with chronic small vessel ischemic disease with scattered remote infarcts as above.   Ct Head Code Stroke Wo Contrast 04/03/2018 IMPRESSION:  1. No acute intracranial infarct or other abnormality.  2. ASPECTS is 10.  3. Atrophy with extensive chronic ischemic changes as above, stable from previous.   Dg Forearm Right 04/03/2018 IMPRESSION:  Acute impacted and minimally displaced fractures of the distal radius and ulna just proximal to the wrist joint.   Dg Wrist Complete Right 04/04/2018 IMPRESSION:  Improved position and alignment of distal radius and ulnar fractures as described. No new abnormality.   EEG  Normal electroencephalogram, awake, drowsy and with activation procedures. There are no focal lateralizing or epileptiform features.  TTE pending   PHYSICAL EXAM  Pleasant elderly Caucasian lady not in distress and she has right forearm bandaged due to her radius fracture. Afebrile. Head is nontraumatic. Neck is supple without bruit.    Cardiac exam no murmur or gallop. Lungs are clear to auscultation. Distal pulses are well felt. Neurological Exam :  Awake alert slow to respond to questions due to hearing difficulty but fluent speech. Able to name and repeat. Follows two-step commands. No dysarthria. Extraocular movements are full range without nystagmus. Blinks to threat bilaterally. Fundi not visualized. Face is symmetric without weakness. Tongue midline. Right  upper extremity strength testing limited due to radius fracture but no obvious focal weakness. Moves all 4 extremities symmetrically. Lower extremity extremity mild bilateral hip flexor weakness but symmetric strength. Deep tendon for this symmetric. Sensation is intact. Plantars are downgoing. Gait not tested.   ASSESSMENT/PLAN Ms. JAQUASIA DOSCHER is a 84 y.o. female with history of HTN, HLD, IBS, TIA, osteopenia, diverticulosis presenting with sudden onset expressive aphasia and right-sided weakness resulting in fall leading to right radius and ulnar fracture.   IV TPA administered 04/03/2018 at 1823.  Suspected stroke s/p tPA but MRI negative and possible seizure - Similar event in March 2019, seen at [facility] with near syncope event at the [employer] followed by diffiidulty getting her words out. MRI neg. EEG neg.   Code Stroke CT head No acute stroke. Small vessel disease. Atrophy. ASPECTS 10.     CTA head & neck no LVO.  Atherosclerosis.  R M1 outpouching (aneurysm versus vascular infundibulum).  Tortuosity of vasculature in the neck suggesting underlying HTN  CT perfusion failed and nondiagnostic  MRI no acute stroke  2D Echo pending  EEG negative for seizure  On Keppra ER 500 daily  LDL 86  HgbA1c 4.7  SCDs for VTE prophylaxis  clopidogrel 75 mg daily prior to admission, now on Plavix 75.  Continue Plavix on discharge.  Therapy recommendations: CIR  Disposition:  pending   Distal R radius and ulnar fx following fall  Splint in place.   Ortho on board  Plan for surgery next Tuesday  Hypertension  Stable  On home medications : Norvasc 5, lisinopril 20 . Long-term BP goal normotensive  Hyperlipidemia  Home meds: Pravachol 20, resumed in hospital  LDL 86, goal < 70  Continue statin at discharge  Other Stroke Risk Factors  Advanced age  Hx stroke/TIA  10/2017 -near syncope event with difficulty getting her words out.  Seen at [facility].  MRI negative.  EEG  negative.  Seizure versus TIA.  Started on Plavix  06/2014 right arm weakness. TIA. MRI neg. Placed on aspirin 325  Family hx stroke (father)  Other Active Problems  Leukocytosis 13.9 -> 10.7  Hypokalemia - 3.3 - supplemented  Hospital day # 2  Rosalin Hawking, MD PhD Stroke Neurology 04/05/2018 5:44 PM        To contact Stroke Continuity provider, please refer to [email or web address] After hours, contact General Neurology

## 2018-04-06 ENCOUNTER — Inpatient Hospital Stay (HOSPITAL_COMMUNITY): Payer: PPO

## 2018-04-06 ENCOUNTER — Encounter (HOSPITAL_COMMUNITY): Payer: Self-pay

## 2018-04-06 DIAGNOSIS — Z8673 Personal history of transient ischemic attack (TIA), and cerebral infarction without residual deficits: Secondary | ICD-10-CM

## 2018-04-06 DIAGNOSIS — I351 Nonrheumatic aortic (valve) insufficiency: Secondary | ICD-10-CM

## 2018-04-06 DIAGNOSIS — E876 Hypokalemia: Secondary | ICD-10-CM

## 2018-04-06 DIAGNOSIS — D72829 Elevated white blood cell count, unspecified: Secondary | ICD-10-CM

## 2018-04-06 LAB — ECHOCARDIOGRAM COMPLETE
Height: 59 in
Weight: 1576.73 oz

## 2018-04-06 NOTE — Progress Notes (Signed)
  Echocardiogram 2D Echocardiogram has been performed.  Jennette Dubin 04/06/2018, 11:33 AM

## 2018-04-06 NOTE — Progress Notes (Signed)
  Echocardiogram 2D Echocardiogram was attempted but patient needed to stay in chair until 10 per RN, we will try again later today.   Jennette Dubin 04/06/2018, 9:10 AM

## 2018-04-06 NOTE — Progress Notes (Signed)
Occupational Therapy Treatment Patient Details Name: Kellie Harris MRN: [id number] DOB: 18-Jan-1931 Today's Date: 04/06/2018    History of present illness 84 y.o. female admitted on 04/03/18 for acute aphasia and R sided weakness.  CT was negative, so tPA given.  MRI is pending. Suspected L hemisphere stroke.  Pt also fell sustaining a R wrist fx due to have ORIF on 04/08/18 and is NWB pre op and post op.  Pt with significant PMH of HOH (wears hearing aids), osteopenia, R knee fx, HTN, DDD (lumbar), R TKA, IM nail L hip, and bil cataract surgery.   OT comments  Pt demonstrating progress toward OT goals. On my arrival, she was standing at the sink brushing her teeth with her son's assistance. She requires min assist for standing grooming tasks this session. She was able to ambulate from sink back to bed with min handheld assist today as well. She continues to report pain in R UE but reports this is improving. Educated concerning sling wear for support during functional mobility. Additionally, pt reports that she has not been able to use Carter foam for elevation. Educated concerning need to maximize elevation for edema management with pt and RN. Requested pediatric sized [place] foam from RN who reports that she will call about this. Pt reports understanding. Pt also educated concerning digit AROM throughout the day. Checked splint and noted no skin breakdown or irritation and noted edema improving greatly.    Follow Up Recommendations  CIR    Equipment Recommendations  Other (comment)(TBD at next venue of care)    Recommendations for Other Services Rehab consult    Precautions / Restrictions Precautions Precautions: Fall Precaution Comments: BP restrictions no >180/>105 Required Braces or Orthoses: Sling;Other Brace/Splint Other Brace/Splint: sling when up, carter positioning foam when in bed Restrictions Weight Bearing Restrictions: Yes RUE Weight Bearing: Non weight bearing       Mobility  Bed Mobility Overal bed mobility: Needs Assistance Bed Mobility: Sit to Supine       Sit to supine: Min guard   General bed mobility comments: Guarding assist for safety.   Transfers Overall transfer level: Needs assistance Equipment used: 1 person hand held assist             General transfer comment: standing at sink on my arrival    Balance Overall balance assessment: Needs assistance Sitting-balance support: No upper extremity supported;Feet supported Sitting balance-Leahy Scale: Fair     Standing balance support: Single extremity supported Standing balance-Leahy Scale: Fair Standing balance comment: Able to statically stand without UE support today.                            ADL either performed or assessed with clinical judgement   ADL Overall ADL's : Needs assistance/impaired     Grooming: Standing;Minimal assistance Grooming Details (indicate cue type and reason): assist primarily for bimanual tasks                 Toilet Transfer: Ambulation;Minimal assistance           Functional mobility during ADLs: Minimal assistance General ADL Comments: Pt OOB brushing her teeth with son's assistance on my arrival.      Vision   Vision Assessment?: No apparent visual deficits   Perception     Praxis      Cognition Arousal/Alertness: Awake/alert Behavior During Therapy: WFL for tasks assessed/performed Overall Cognitive Status: Impaired/Different from baseline Area of Impairment: Attention;Memory;Following  commands;Safety/judgement;Awareness;Problem solving                   Current Attention Level: Sustained Memory: Decreased short-term memory Following Commands: Follows one step commands consistently Safety/Judgement: Decreased awareness of deficits;Decreased awareness of safety Awareness: Emergent Problem Solving: Slow processing General Comments: Pt with improving cognition but remains with difficulty with attention and  problem solving. Awareness improving to emergent today. Pt also limited in assessment due to being hard of hearing.         Exercises Other Exercises Other Exercises: Pt's splint assessed and facilitated improved positioning of R UE. Educated concerning use of sling for support during mobility. No redness or skin breakdown noted. Skin appropriately warm. Pt placed in [place] pillow although this is very large for her. Encouraged 20 repetitions digit AROM prior to all meals (3x/day). Requested that RN look into pediatric Carter pillow for improved comfort.    Shoulder Instructions       General Comments      Pertinent Vitals/ Pain       Pain Assessment: Faces Faces Pain Scale: Hurts little more Pain Location: R wrist with movement of RUE Pain Descriptors / Indicators: Grimacing;Guarding Pain Intervention(s): Limited activity within patient's tolerance;Monitored during session;Repositioned  Home Living                                          Prior Functioning/Environment              Frequency  Min 3X/week        Progress Toward Goals  OT Goals(current goals can now be found in the care plan section)  Progress towards OT goals: Progressing toward goals  Acute Rehab OT Goals Patient Stated Goal: "arm to be fixed" OT Goal Formulation: With patient/family Time For Goal Achievement: 04/18/18 Potential to Achieve Goals: Good  Plan Discharge plan remains appropriate    Co-evaluation                 AM-PAC PT "6 Clicks" Daily Activity     Outcome Measure   Help from another person eating meals?: A Lot Help from another person taking care of personal grooming?: A Little Help from another person toileting, which includes using toliet, bedpan, or urinal?: A Lot Help from another person bathing (including washing, rinsing, drying)?: A Lot Help from another person to put on and taking off regular upper body clothing?: A Lot Help from another  person to put on and taking off regular lower body clothing?: A Lot 6 Click Score: 13    End of Session    OT Visit Diagnosis: Unsteadiness on feet (R26.81);Pain Pain - Right/Left: Left Pain - part of body: Arm   Activity Tolerance Patient tolerated treatment well   Patient Left in bed;with call bell/phone within reach;with bed alarm set   Nurse Communication Other (comment)(pt needs smaller Carter pillow)        Time: 6389-3734 OT Time Calculation (min): 17 min  Charges: OT General Charges $OT Visit: 1 Visit OT Treatments $Self Care/Home Management : 8-22 mins  Norman Herrlich, MS OTR/L  Pager: [place] 04/06/2018, 10:29 AM

## 2018-04-06 NOTE — Progress Notes (Signed)
STROKE TEAM PROGRESS NOTE   INTERVAL HISTORY No family is at the bedside.  She is pleasant and cooperative with exam.  Right wrist with splint and dressing.  Pending left wrist surgery on Tuesday.  Vitals:   04/05/18 2310 04/06/18 0320 04/06/18 0827 04/06/18 1216  BP: (!) 157/78 128/77 136/82 (!) 129/95  Pulse: 98 76 86 80  Resp:   16   Temp: 99 F (37.2 C) 98.8 F (37.1 C) 98.2 F (36.8 C) 99.1 F (37.3 C)  TempSrc: Oral Oral Oral Oral  SpO2: 95% 97% 95% 97%  Weight:      Height:        CBC:  Recent Labs  Lab 04/03/18 1816 04/05/18 0409  WBC 13.9* 10.7*  NEUTROABS 11.9*  --   HGB 13.2 12.2  HCT 40.4 37.5  MCV 93.1 93.5  PLT 249 063    Basic Metabolic Panel:  Recent Labs  Lab 04/03/18 1816 04/05/18 0409  NA 133* 137  K 3.6 3.3*  CL 101 104  CO2 20* 21*  GLUCOSE 133* 97  BUN 13 10  CREATININE 0.91 0.80  CALCIUM 10.0 9.6   Lipid Panel:     Component Value Date/Time   CHOL 161 04/04/2018 0414   TRIG 93 04/04/2018 0414   HDL 56 04/04/2018 0414   CHOLHDL 2.9 04/04/2018 0414   VLDL 19 04/04/2018 0414   LDLCALC 86 04/04/2018 0414   HgbA1c:  Lab Results  Component Value Date   HGBA1C 4.7 (L) 04/04/2018   Urine Drug Screen:     Component Value Date/Time   LABOPIA NONE DETECTED 11/04/2017 2039   COCAINSCRNUR NONE DETECTED 11/04/2017 2039   LABBENZ POSITIVE (A) 11/04/2017 2039   AMPHETMU NONE DETECTED 11/04/2017 2039   THCU NONE DETECTED 11/04/2017 2039   LABBARB NONE DETECTED 11/04/2017 2039    Alcohol Level No results found for: ETH   IMAGING  Ct Angio Head W Or Wo Contrast Ct Angio Neck W Or Wo Contrast Ct Cerebral Perfusion W Contrast 04/03/2018 IMPRESSION:  1. Negative CTA for large vessel occlusion.  2. Failed and nondiagnostic CT perfusion.  3. Atheromatous change involving the major arterial vasculature of the head and neck, stable relative to previous CTA from 11/04/2017. Changes most notable within the bilateral V4 segments were there  are mild to moderate stenoses. No other hemodynamically significant or correctable stenosis identified.  4. Stable 3 mm focal outpouching arising from the distal right M1 segment, possibly reflecting a small aneurysm versus vascular infundibulum.  5. Tortuosity of the major arterial vasculature within the neck, suggesting chronic underlying hypertension.   Mr Brain Wo Contrast 04/04/2018 IMPRESSION:  1. No acute intracranial infarct or other abnormality identified.  2. Moderately advanced cerebral atrophy with chronic small vessel ischemic disease with scattered remote infarcts as above.   Ct Head Code Stroke Wo Contrast 04/03/2018 IMPRESSION:  1. No acute intracranial infarct or other abnormality.  2. ASPECTS is 10.  3. Atrophy with extensive chronic ischemic changes as above, stable from previous.   Dg Forearm Right 04/03/2018 IMPRESSION:  Acute impacted and minimally displaced fractures of the distal radius and ulna just proximal to the wrist joint.   Dg Wrist Complete Right 04/04/2018 IMPRESSION:  Improved position and alignment of distal radius and ulnar fractures as described. No new abnormality.   EEG  Normal electroencephalogram, awake, drowsy and with activation procedures. There are no focal lateralizing or epileptiform features.  TTE 04/06/2018 Study Conclusions - Left ventricle: The cavity size was  normal. Wall thickness was   increased in a pattern of mild LVH. Systolic function was normal.   The estimated ejection fraction was in the range of 60% to 65%.   Wall motion was normal; there were no regional wall motion   abnormalities. Doppler parameters are consistent with abnormal   left ventricular relaxation (grade 1 diastolic dysfunction). - Aortic valve: Moderately calcified annulus. Trileaflet;   moderately thickened leaflets. There was mild regurgitation.   Valve area (VTI): 3.46 cm^2. Valve area (Vmax): 2.95 cm^2. Valve   area (Vmean): 2.94 cm^2. - Mitral  valve: Mildly calcified annulus. Normal thickness leaflets - Technically adequate study.    PHYSICAL EXAM Pleasant elderly Caucasian lady not in distress and she has right forearm bandaged due to her radius fracture. Afebrile. Head is nontraumatic. Neck is supple without bruit.    Cardiac exam no murmur or gallop. Lungs are clear to auscultation. Distal pulses are well felt. Neurological Exam :  Awake alert slow to respond to questions due to hearing difficulty but fluent speech. Able to name and repeat. Follows two-step commands. No dysarthria. Extraocular movements are full range without nystagmus. Blinks to threat bilaterally. Fundi not visualized. Face is symmetric without weakness. Tongue midline. Right upper extremity strength testing limited due to radius fracture but no obvious focal weakness. Moves all 4 extremities symmetrically. Lower extremity extremity mild bilateral hip flexor weakness but symmetric strength. Deep tendon for this symmetric. Sensation is intact. Plantars are downgoing. Gait not tested.   ASSESSMENT/PLAN [name] is a 84 y.o. female with history of HTN, HLD, IBS, TIA, osteopenia, diverticulosis presenting with sudden onset expressive aphasia and right-sided weakness resulting in fall leading to right radius and ulnar fracture.   IV TPA administered 04/03/2018 at 1823.  Suspected stroke s/p tPA but MRI negative and possible seizure - Similar event in March 2019, seen at [facility] with near syncope event at the [facility] followed by diffiidulty getting her words out. MRI neg. EEG neg.   Code Stroke CT head No acute stroke. Small vessel disease. Atrophy. ASPECTS 10.     CTA head & neck no LVO.  Atherosclerosis.  R M1 outpouching (aneurysm versus vascular infundibulum).  Tortuosity of vasculature in the neck suggesting underlying HTN  CT perfusion failed and nondiagnostic  MRI no acute stroke  2D Echo  - EF 60 - 65%. No cardiac source of emboli identified.    EEG negative for seizure  On Keppra ER 500 daily  LDL 86  HgbA1c 4.7  SCDs for VTE prophylaxis  clopidogrel 75 mg daily prior to admission, now on Plavix 75.  Continue Plavix on discharge.  Therapy recommendations: CIR  Disposition:  pending   Distal R radius and ulnar fx following fall  Splint in place.   Ortho on board  Plan for surgery on Tuesday  Hypertension  Stable  On home medications : Norvasc 5, lisinopril 20 . Long-term BP goal normotensive  Hyperlipidemia  Home meds: Pravachol 20, resumed in hospital  LDL 86, goal < 70  Continue statin at discharge  Other Stroke Risk Factors  Advanced age  Hx stroke/TIA  10/2017 -near syncope event with difficulty getting her words out.  Seen at [facility].  MRI negative.  EEG negative.  Seizure versus TIA.  Started on Plavix  06/2014 right arm weakness. TIA. MRI neg. Placed on aspirin 325  Family hx stroke (father)  Other Active Problems  Leukocytosis 13.9 -> 10.7  Hypokalemia - 3.3 - supplemented -> recheck  in AM as well as [facility] day # 3  Rosalin Hawking, MD PhD Stroke Neurology 04/06/2018 5:42 PM    To contact Stroke Continuity provider, please refer to [email or web address] After hours, contact General Neurology

## 2018-04-07 LAB — BASIC METABOLIC PANEL
Anion gap: 7 (ref 5–15)
BUN: 12 mg/dL (ref 8–23)
CHLORIDE: 107 mmol/L (ref 98–111)
CO2: 21 mmol/L — ABNORMAL LOW (ref 22–32)
Calcium: 9.1 mg/dL (ref 8.9–10.3)
Creatinine, Ser: 0.74 mg/dL (ref 0.44–1.00)
GFR calc Af Amer: >60 mL/min (ref >60)
Glucose, Bld: 89 mg/dL (ref 70–99)
Potassium: 3.1 mmol/L — ABNORMAL LOW (ref 3.5–5.1)
SODIUM: 135 mmol/L (ref 135–145)

## 2018-04-07 LAB — MAGNESIUM: Magnesium: 1.8 mg/dL (ref 1.7–2.4)

## 2018-04-07 LAB — CBC
HCT: 36 % (ref 36.0–46.0)
HEMOGLOBIN: 11.6 g/dL — AB (ref 12.0–15.0)
MCH: 30.2 pg (ref 26.0–34.0)
MCHC: 32.2 g/dL (ref 30.0–36.0)
MCV: 93.8 fL (ref 78.0–100.0)
Platelets: 203 10*3/uL (ref 150–400)
RBC: 3.84 MIL/uL — AB (ref 3.87–5.11)
RDW: 13 % (ref 11.5–15.5)
WBC: 9.9 10*3/uL (ref 4.0–10.5)

## 2018-04-07 LAB — VITAMIN D 25 HYDROXY (VIT D DEFICIENCY, FRACTURES): Vit D, 25-Hydroxy: 33.9 ng/mL (ref 30.0–100.0)

## 2018-04-07 LAB — CALCITRIOL (1,25 DI-OH VIT D): Vit D, 1,25-Dihydroxy: 57.5 pg/mL (ref 19.9–79.3)

## 2018-04-07 MED ORDER — CEFAZOLIN SODIUM-DEXTROSE 2-4 GM/100ML-% IV SOLN
2.0000 g | INTRAVENOUS | Status: AC
  Administered 2018-04-08: 2 g via INTRAVENOUS
  Filled 2018-04-07: qty 100

## 2018-04-07 MED ORDER — MAGNESIUM SULFATE 2 GM/50ML IV SOLN
2.0000 g | Freq: Once | INTRAVENOUS | Status: AC
  Administered 2018-04-07: 2 g via INTRAVENOUS
  Filled 2018-04-07: qty 50

## 2018-04-07 MED ORDER — POTASSIUM CHLORIDE CRYS ER 20 MEQ PO TBCR
40.0000 meq | EXTENDED_RELEASE_TABLET | ORAL | Status: AC
  Administered 2018-04-07 (×2): 40 meq via ORAL
  Filled 2018-04-07 (×2): qty 2

## 2018-04-07 NOTE — Care Management Important Message (Signed)
Important Message  Patient Details  Name: Kellie Harris MRN: [id number] Date of Birth: [birth year]   Medicare Important Message Given:  Yes    Jester Klingberg 04/07/2018, 4:01 PM

## 2018-04-07 NOTE — Progress Notes (Signed)
Occupational Therapy Treatment Patient Details Name: Kellie Harris MRN: [id number] DOB: 12-Feb-1931 Today's Date: 04/07/2018    History of present illness 84 y.o. female admitted on 04/03/18 for acute aphasia and R sided weakness.  CT was negative, so tPA given.  MRI is pending. Suspected L hemisphere stroke.  Pt also fell sustaining a R wrist fx due to have ORIF on 04/08/18 and is NWB pre op and post op.  Pt with significant PMH of HOH (wears hearing aids), osteopenia, R knee fx, HTN, DDD (lumbar), R TKA, IM nail L hip, and bil cataract surgery.   OT comments  Pt is making good progress toward goals.  She is able to perform ADLs with min A, requires assist for balance UB ADLs.    Continue to feel she will benefit from CIR to allow her to maximize safety and safety with ADLs.    Follow Up Recommendations  CIR    Equipment Recommendations  None recommended by OT    Recommendations for Other Services Rehab consult    Precautions / Restrictions Precautions Precautions: Fall Required Braces or Orthoses: Sling;Other Brace/Splint Other Brace/Splint: sling when up, carter positioning foam when in bed Restrictions Weight Bearing Restrictions: Yes RUE Weight Bearing: Non weight bearing       Mobility Bed Mobility Overal bed mobility: Needs Assistance Bed Mobility: Sit to Supine;Supine to Sit     Supine to sit: Min guard Sit to supine: Min guard   General bed mobility comments: increased time and effort   Transfers Overall transfer level: Needs assistance Equipment used: 1 person hand held assist Transfers: Sit to/from [facility] Sit to Stand: Min assist Stand pivot transfers: Min assist       General transfer comment: assist for balance     Balance Overall balance assessment: Needs assistance Sitting-balance support: No upper extremity supported;Feet supported Sitting balance-Leahy Scale: Good Sitting balance - Comments: able to bend forward to reach feet     Standing balance support: No upper extremity supported Standing balance-Leahy Scale: Fair Standing balance comment: able to statically stand with min gaurd assist and no UE support                            ADL either performed or assessed with clinical judgement   ADL Overall ADL's : Needs assistance/impaired Eating/Feeding: Set up   Grooming: Wash/Needle hands;Wash/Folson face;Oral care;Brushing hair;Minimal assistance;Standing Grooming Details (indicate cue type and reason): min A for balance  Upper Body Bathing: Minimal assistance;Sitting   Lower Body Bathing: Minimal assistance;Sit to/from stand       Lower Body Dressing: Minimal assistance;Sit to/from stand   Toilet Transfer: Minimal assistance;Ambulation;Comfort height toilet;Grab bars   Toileting- Clothing Manipulation and Hygiene: Min guard;Sit to/from stand       Functional mobility during ADLs: Minimal assistance General ADL Comments: assist for balance      Vision   Vision Assessment?: No apparent visual deficits   Perception     Praxis      Cognition Arousal/Alertness: Awake/alert Behavior During Therapy: WFL for tasks assessed/performed Overall Cognitive Status: Within Functional Limits for tasks assessed                                 General Comments: [facility] for basic ADL tasks         Exercises     Shoulder Instructions  General Comments      Pertinent Vitals/ Pain       Pain Assessment: Faces Pain Score: 6  Faces Pain Scale: Hurts little more Pain Location: right arm Pain Descriptors / Indicators: Grimacing;Guarding Pain Intervention(s): Monitored during session;Repositioned  Home Living                                          Prior Functioning/Environment              Frequency  Min 3X/week        Progress Toward Goals  OT Goals(current goals can now be found in the care plan section)  Progress towards OT goals:  Progressing toward goals     Plan Discharge plan remains appropriate    Co-evaluation                 AM-PAC PT "6 Clicks" Daily Activity     Outcome Measure   Help from another person eating meals?: A Little Help from another person taking care of personal grooming?: A Little Help from another person toileting, which includes using toliet, bedpan, or urinal?: A Little Help from another person bathing (including washing, rinsing, drying)?: A Little Help from another person to put on and taking off regular upper body clothing?: A Little Help from another person to put on and taking off regular lower body clothing?: A Little 6 Click Score: 18    End of Session Equipment Utilized During Treatment: Gait belt  OT Visit Diagnosis: Unsteadiness on feet (R26.81);Pain Pain - Right/Left: Left Pain - part of body: Arm   Activity Tolerance Patient tolerated treatment well   Patient Left in bed;with call bell/phone within reach;with bed alarm set   Nurse Communication          Time: 4627-0350 OT Time Calculation (min): 36 min  Charges: OT General Charges $OT Visit: 1 Visit OT Treatments $Self Care/Home Management : 23-37 mins  [facility], OTR/L [phone]    Lucille Passy M 04/07/2018, 2:36 PM

## 2018-04-07 NOTE — Progress Notes (Signed)
Physical Therapy Treatment Patient Details Name: Kellie Harris MRN: [id number] DOB: 10-18-1930 Today's Date: 04/07/2018    History of Present Illness 84 y.o. female admitted on 04/03/18 for acute aphasia and R sided weakness.  CT was negative, so tPA given.  MRI is pending. Suspected L hemisphere stroke.  Pt also fell sustaining a R wrist fx due to have ORIF on 04/08/18 and is NWB pre op and post op.  Pt with significant PMH of HOH (wears hearing aids), osteopenia, R knee fx, HTN, DDD (lumbar), R TKA, IM nail L hip, and bil cataract surgery.    PT Comments    Patient seen for mobility progression. Pt is making progress toward PT goals and tolerated session well. Pt requires min/mod A for safety with functional transfers and gait given impaired balance.  Continue to progress as tolerated. Current plan remains appropriate.   Follow Up Recommendations  CIR     Equipment Recommendations  Rolling walker with 5" wheels;Other (comment)(R platform )    Recommendations for Other Services Rehab consult     Precautions / Restrictions Precautions Precautions: Fall Required Braces or Orthoses: Sling;Other Brace/Splint Other Brace/Splint: sling when up, carter positioning foam when in bed Restrictions Weight Bearing Restrictions: Yes RUE Weight Bearing: Non weight bearing    Mobility  Bed Mobility Overal bed mobility: Needs Assistance Bed Mobility: Supine to Sit;Sit to Supine     Supine to sit: Min guard Sit to supine: Min guard   General bed mobility comments: increased time and effort   Transfers Overall transfer level: Needs assistance Equipment used: 1 person hand held assist Transfers: Sit to/from Stand Sit to Stand: Min assist Stand pivot transfers: Min assist       General transfer comment: assist for balance and cues for NWB R UE  Ambulation/Gait Ambulation/Gait assistance: Min assist;Mod assist Gait Distance (Feet): 100 Feet Assistive device: Straight cane Gait  Pattern/deviations: Step-through pattern;Decreased stride length;Drifts right/left;Narrow base of support     General Gait Details: cues for sequencing; pt requires assistance for balance and weight shifting as well as protecting R UE when navigating environment   Stairs             Wheelchair Mobility    Modified Rankin (Stroke Patients Only)       Balance Overall balance assessment: Needs assistance Sitting-balance support: No upper extremity supported;Feet supported Sitting balance-Leahy Scale: Good Sitting balance - Comments: able to bend forward to reach feet    Standing balance support: Single extremity supported;During functional activity Standing balance-Leahy Scale: Poor Standing balance comment: able to statically stand with min gaurd assist and no UE support                             Cognition Arousal/Alertness: Awake/alert Behavior During Therapy: WFL for tasks assessed/performed Overall Cognitive Status: Within Functional Limits for tasks assessed                                 General Comments: WFL for basic ADL tasks       Exercises      General Comments        Pertinent Vitals/Pain Pain Assessment: Faces Faces Pain Scale: Hurts a little bit Pain Location: right arm Pain Descriptors / Indicators: Guarding Pain Intervention(s): Limited activity within patient's tolerance;Monitored during session;Repositioned    Home Living  Prior Function            PT Goals (current goals can now be found in the care plan section) Progress towards PT goals: Progressing toward goals    Frequency    Min 4X/week      PT Plan Current plan remains appropriate    Co-evaluation              AM-PAC PT "6 Clicks" Daily Activity  Outcome Measure  Difficulty turning over in bed (including adjusting bedclothes, sheets and blankets)?: A Little Difficulty moving from lying on back to sitting  on the side of the bed? : A Little Difficulty sitting down on and standing up from a chair with arms (e.g., wheelchair, bedside commode, etc,.)?: Unable Help needed moving to and from a bed to chair (including a wheelchair)?: A Little Help needed walking in hospital room?: A Lot Help needed climbing 3-5 steps with a railing? : A Lot 6 Click Score: 14    End of Session Equipment Utilized During Treatment: Gait belt Activity Tolerance: Patient tolerated treatment well Patient left: in bed;with call bell/phone within reach;with family/visitor present;with SCD's reapplied Nurse Communication: Mobility status PT Visit Diagnosis: Muscle weakness (generalized) (M62.81);Difficulty in walking, not elsewhere classified (R26.2);Hemiplegia and hemiparesis Hemiplegia - Right/Left: Right Hemiplegia - dominant/non-dominant: Dominant Hemiplegia - caused by: Cerebral infarction     Time: 3524-8185 PT Time Calculation (min) (ACUTE ONLY): 30 min  Charges:  $Gait Training: 8-22 mins $Therapeutic Activity: 8-22 mins                     Earney Navy, PTA Pager: [phone]     Darliss Cheney 04/07/2018, 5:13 PM

## 2018-04-07 NOTE — Progress Notes (Signed)
  Speech Language Pathology Treatment: Cognitive-Linquistic  Patient Details Name: Kellie Harris MRN: [id number] DOB: 1931/06/19 Today's Date: 04/07/2018 Time: 1220-1250 SLP Time Calculation (min) (ACUTE ONLY): 30 min  Assessment / Plan / Recommendation Clinical Impression  Pt seen at bedside for follow up after cognitive linguistic evaluation completed last week. She appears to be making good progress. Pt was able to follow conversation and discuss details of her recent hospitalization. She demonstrates improvement in her ability to follow commands as well. Will plan to complete further assessment of cognitive linguistic skills at next session. Recommendations for post-acute ST intervention pending.    HPI HPI: Pt is an 84 yo female who presented with acute onset aphasia and R sided weakness, which resulted in a fall on her R wrist with distal radius and ulna fx.  CT head negative; MRI pending. Pt is s/p tPA. Pt had a similar episode in March 2019, at which time she was seen by SLP with no signs of cognitive-linguistic impairment when accounting for pt's difficulty hearing. PMH: HTN, HH, skin cancer, arthritis      SLP Plan  Continue with current plan of care       Recommendations   Continue diagnostic treatment next session                Oral Care Recommendations: Oral care BID Follow up Recommendations: 24 hour supervision/assistance;Other (comment) SLP Visit Diagnosis: Cognitive communication deficit (X67.289) Plan: Continue with current plan of care       [place]. Quentin Ore [facility], CCC-SLP Speech Language Pathologist [phone]  Shonna Chock 04/07/2018, 12:56 PM

## 2018-04-07 NOTE — Progress Notes (Signed)
Inpatient Rehabilitation-Admissions Coordinator   [facility] met with pt at the bedside as follow up from PM&R consult. Pt understands she will need some sort of rehab prior to returning home. AC spoke with pt's daughter over the phone who would like more info regarding CIR; brochures left in pt room for family. AC to follow up with pt after pending surgery tomorrow and therapy assessments to determine appropriateness of CIR.   Jhonnie Garner, OTR/L  Rehab Admissions Coordinator  [phone] 04/07/2018 11:29 AM

## 2018-04-07 NOTE — Progress Notes (Signed)
Orthopedic Trauma Service Progress Note   Patient ID: MALYNN LUCY MRN: [id number] DOB/AGE: 14-Sep-1930 84 y.o.  Subjective:  Doing ok Pain in R wrist improved No acute changes  Denies numbness or tingling in R hand  Tolerating splint  ROS As above  Objective:   VITALS:   Vitals:   04/06/18 2354 04/07/18 0453 04/07/18 0915 04/07/18 1238  BP: 127/81 126/80 129/74 (!) 142/91  Pulse: 72 85 100 96  Resp: 17 16 16 20   Temp: 98.3 F (36.8 C) 98.4 F (36.9 C) 97.8 F (36.6 C) 98.2 F (36.8 C)  TempSrc: Oral Oral Oral Oral  SpO2: 94% 96% (!) 79% (!) 85%  Weight:      Height:        Estimated body mass index is 19.9 kg/m as calculated from the following:   Height as of this encounter: 4\' 11"  (1.499 m).   Weight as of this encounter: 44.7 kg.   Intake/Output      08/25 0701 - 08/26 0700 08/26 0701 - 08/27 0700   P.O. 960    Total Intake(mL/kg) 960 (21.5)    Net +960         Urine Occurrence 4 x    Stool Occurrence 1 x      LABS  Results for orders placed or performed during the hospital encounter of 04/03/18 (from the past 24 hour(s))  Basic metabolic panel     Status: Abnormal   Collection Time: 04/07/18  5:16 AM  Result Value Ref Range   Sodium 135 135 - 145 mmol/L   Potassium 3.1 (L) 3.5 - 5.1 mmol/L   Chloride 107 98 - 111 mmol/L   CO2 21 (L) 22 - 32 mmol/L   Glucose, Bld 89 70 - 99 mg/dL   BUN 12 8 - 23 mg/dL   Creatinine, Ser 0.74 0.44 - 1.00 mg/dL   Calcium 9.1 8.9 - 10.3 mg/dL   GFR calc non Af Amer >60 >60 mL/min   GFR calc Af Amer >60 >60 mL/min   Anion gap 7 5 - 15  CBC     Status: Abnormal   Collection Time: 04/07/18  5:16 AM  Result Value Ref Range   WBC 9.9 4.0 - 10.5 K/uL   RBC 3.84 (L) 3.87 - 5.11 MIL/uL   Hemoglobin 11.6 (L) 12.0 - 15.0 g/dL   HCT 36.0 36.0 - 46.0 %   MCV 93.8 78.0 - 100.0 fL   MCH 30.2 26.0 - 34.0 pg   MCHC 32.2 30.0 - 36.0 g/dL   RDW 13.0 11.5 - 15.5 %   Platelets 203 150 - 400 K/uL   Magnesium     Status: None   Collection Time: 04/07/18  5:16 AM  Result Value Ref Range   Magnesium 1.8 1.7 - 2.4 mg/dL     PHYSICAL EXAM:   Gen: resting comfortably in bed, NAD, pleasant, daughter at bedside  Ext:      Right Upper extremity   Sugar tong splint in place  Fitting well  Swelling very well controlled  Ecchymosis present to fingers  Moving fingers well  Ext warm   No pain with passives stretch   Assessment/Plan:     Active Problems:   Stroke (cerebrum) (HCC)   Fracture   History of CVA (cerebrovascular accident)   Benign essential HTN   Tachycardia   Hypokalemia   Leukocytosis   Seizures (HCC)   Anti-infectives (From admission, onward)   Start     Dose/Rate  Route Frequency Ordered Stop   04/08/18 0600  ceFAZolin (ANCEF) IVPB 2g/100 mL premix     2 g 200 mL/hr over 30 Minutes Intravenous On call to O.R. 04/07/18 1620 04/09/18 0559    .  84 y/o female s/p fall   -R distal radius and ulna fracture   OR tomorrow for ORIF  Will allow her to WB thru elbow with use of platform walker  Ice and elevate  Finger motion as tolerated   - Medical issues   Per primary   - Dispo:  OR tomorrow R R wrist  NPO after MN        Jari Pigg, PA-C [facility] [phone] (P) [phone] Levi Aland (C) 04/07/2018, 4:21 PM

## 2018-04-07 NOTE — Progress Notes (Addendum)
STROKE TEAM PROGRESS NOTE   INTERVAL HISTORY Lying in bed. Ready for her surgery tomorrow. No neuro deficits. Used to work at [employer].  Reports  cardiology had her wear a 30-d monitor which was negative.  They recommended a loop recorder to look for atrial fibrillation but she has not yet decided if she wants that.  She denies any palpitations or symptoms of atrial fibrillation.  She does okay walking.  Vitals:   04/06/18 1503 04/06/18 2032 04/06/18 2354 04/07/18 0453  BP: (!) 130/97 (!) 163/95 127/81 126/80  Pulse: 78 77 72 85  Resp: 18 18 17 16   Temp: 98 F (36.7 C) 98.6 F (37 C) 98.3 F (36.8 C) 98.4 F (36.9 C)  TempSrc: Oral Oral Oral Oral  SpO2:  98% 94% 96%  Weight:      Height:        CBC:  Recent Labs  Lab 04/03/18 1816 04/05/18 0409 04/07/18 0516  WBC 13.9* 10.7* 9.9  NEUTROABS 11.9*  --   --   HGB 13.2 12.2 11.6*  HCT 40.4 37.5 36.0  MCV 93.1 93.5 93.8  PLT 249 180 353    Basic Metabolic Panel:  Recent Labs  Lab 04/05/18 0409 04/07/18 0516  NA 137 135  K 3.3* 3.1*  CL 104 107  CO2 21* 21*  GLUCOSE 97 89  BUN 10 12  CREATININE 0.80 0.74  CALCIUM 9.6 9.1  MG  --  1.8   Lipid Panel:     Component Value Date/Time   CHOL 161 04/04/2018 0414   TRIG 93 04/04/2018 0414   HDL 56 04/04/2018 0414   CHOLHDL 2.9 04/04/2018 0414   VLDL 19 04/04/2018 0414   LDLCALC 86 04/04/2018 0414   HgbA1c:  Lab Results  Component Value Date   HGBA1C 4.7 (L) 04/04/2018   Urine Drug Screen:     Component Value Date/Time   LABOPIA NONE DETECTED 11/04/2017 2039   COCAINSCRNUR NONE DETECTED 11/04/2017 2039   LABBENZ POSITIVE (A) 11/04/2017 2039   AMPHETMU NONE DETECTED 11/04/2017 2039   THCU NONE DETECTED 11/04/2017 2039   LABBARB NONE DETECTED 11/04/2017 2039    Alcohol Level No results found for: ETH   IMAGING Ct Head Code Stroke Wo Contrast 04/03/2018 1. No acute intracranial infarct or other abnormality.  2. ASPECTS is 10.  3. Atrophy with extensive  chronic ischemic changes as above, stable from previous.   Ct Angio Head W Or Wo Contrast Ct Angio Neck W Or Wo Contrast Ct Cerebral Perfusion W Contrast 04/03/2018 1. Negative CTA for large vessel occlusion.  2. Failed and nondiagnostic CT perfusion.  3. Atheromatous change involving the major arterial vasculature of the head and neck, stable relative to previous CTA from 11/04/2017. Changes most notable within the bilateral V4 segments were there are mild to moderate stenoses. No other hemodynamically significant or correctable stenosis identified.  4. Stable 3 mm focal outpouching arising from the distal right M1 segment, possibly reflecting a small aneurysm versus vascular infundibulum.  5. Tortuosity of the major arterial vasculature within the neck, suggesting chronic underlying hypertension.  Mr Brain Wo Contrast 04/04/2018 1. No acute intracranial infarct or other abnormality identified.  2. Moderately advanced cerebral atrophy with chronic small vessel ischemic disease with scattered remote infarcts as above.   Dg Forearm Right 04/03/2018 Acute impacted and minimally displaced fractures of the distal radius and ulna just proximal to the wrist joint.   Dg Wrist Complete Right 04/04/2018 Improved position and alignment of distal  radius and ulnar fractures as described. No new abnormality.   EEG  Normal electroencephalogram, awake, drowsy and with activation procedures. There are no focal lateralizing or epileptiform features.  TTE 04/06/2018 - Left ventricle: The cavity size was normal. Wall thickness was increased in a pattern of mild LVH. Systolic function was normal. The estimated ejection fraction was in the range of 60% to 65%. Wall motion was normal; there were no regional wall motion abnormalities. Doppler parameters are consistent with abnormal left ventricular relaxation (grade 1 diastolic dysfunction). - Aortic valve: Moderately calcified annulus. Trileaflet; moderately  thickened leaflets. There was mild regurgitation. Valve area (VTI): 3.46 cm^2. Valve area (Vmax): 2.95 cm^2. Valve area (Vmean): 2.94 cm^2. - Mitral valve: Mildly calcified annulus. Normal thickness leaflets - Technically adequate study.   PHYSICAL EXAM Pleasant elderly Caucasian lady not in distress and she has right forearm bandaged due to her radius/ulna fracture. Afebrile. Head is nontraumatic. Neck is supple without bruit. Cardiac exam no murmur or gallop. Lungs are clear to auscultation. Distal pulses are well felt. Neurological Exam :  Awake alert HOH fluent speech. Able to name and repeat. Follows three-step commands. No dysarthria. Extraocular movements are full range without nystagmus. Blinks to threat bilaterally. Fundi not visualized. Face is symmetric without weakness. Tongue midline. Right upper extremity strength testing limited due to radius fracture but no obvious focal weakness. Moves all 4 extremities symmetrically. Lower extremity extremity mild bilateral hip flexor weakness but symmetric strength. Deep tendon for this symmetric. Sensation is intact. Plantars are downgoing. Gait not tested. She has a black OS that is resolving. R hand fingers are edematous and ecchymotic with good movement and pulse.   ASSESSMENT/PLAN Kellie Harris is a 84 y.o. female with history of HTN, HLD, IBS, TIA, osteopenia, diverticulosis presenting with sudden onset expressive aphasia and right-sided weakness resulting in fall leading to right radius and ulnar fracture.   IV TPA administered 04/03/2018 at 1823.  Suspected stroke s/p tPA but MRI negative and possible seizure - Similar event in March 2019, seen at [facility] with near syncope event at the [employer] followed by diffiidulty getting her words out. MRI neg. EEG neg.   Code Stroke CT head No acute stroke. Small vessel disease. Atrophy. ASPECTS 10.     CTA head & neck no LVO.  Atherosclerosis.  R M1 outpouching (aneurysm versus vascular  infundibulum).  Tortuosity of vasculature in the neck suggesting underlying HTN  CT perfusion failed and nondiagnostic  MRI no acute stroke  2D Echo  - EF 60 - 65%. No cardiac source of emboli identified.   EEG negative for seizure  Started On Keppra ER 500 daily  LDL 86  HgbA1c 4.7  SCDs for VTE prophylaxis  clopidogrel 75 mg daily prior to admission, now on Plavix 75.  Continue Plavix on discharge.  Therapy recommendations: CIR  Disposition:  pending   Distal R radius and ulnar fx following fall  Splint in place.   Ortho on board  Plan for ORIF on Tuesday  Hypertension  Stable  On home medications : Norvasc 5, lisinopril 20 . Long-term BP goal normotensive  Hyperlipidemia  Home meds: Pravachol 20, resumed in hospital  LDL 86, goal < 70  Continue statin at discharge  Other Stroke Risk Factors  Advanced age  Hx stroke/TIA  10/2017 -near syncope event with difficulty getting her words out.  Seen at [facility].  MRI negative.  EEG negative.  Seizure versus TIA.  Started on Plavix  06/2014 right  arm weakness. TIA. MRI neg. Placed on aspirin 325  Family hx stroke (father)  Other Active Problems  Leukocytosis, resolved 13.9 -> 10.7-> 9.9  Hypokalemia - 3.1 - supplemented (40 x 2 today) -  recheck in AM   Hospital day # [place], MSN, APRN, ANVP-BC, AGPCNP-BC Advanced Practice Stroke Nurse [place] for Schedule & Pager information 04/07/2018 4:05 PM   ATTENDING NOTE: I reviewed above note and agree with the assessment and plan. I have made any additions or clarifications directly to the above note. Pt was seen and examined.   Patient was seen while walking with PT on the hallway.  No significant weakness, or imbalance.  Still has right arm in splint and dressing.  Plan for surgery tomorrow.  PT/OT recommend CIR.  N.p.o. after midnight for surgery preparation.  Continue Keppra and potassium supplement.  Recheck potassium in  a.m.  Rosalin Hawking, MD PhD Stroke Neurology 04/07/2018 4:35 PM     To contact Stroke Continuity provider, please refer to [email or web address] After hours, contact General Neurology

## 2018-04-08 ENCOUNTER — Inpatient Hospital Stay (HOSPITAL_COMMUNITY): Payer: PPO

## 2018-04-08 ENCOUNTER — Encounter (HOSPITAL_COMMUNITY)
Admission: EM | Disposition: A | Discharge status: Skilled Nursing Facility | Payer: Self-pay | Source: Home / Self Care | Attending: Neurology

## 2018-04-08 ENCOUNTER — Inpatient Hospital Stay (HOSPITAL_COMMUNITY): Payer: PPO | Admitting: Anesthesiology

## 2018-04-08 ENCOUNTER — Encounter (HOSPITAL_COMMUNITY): Payer: Self-pay | Admitting: Certified Registered"

## 2018-04-08 DIAGNOSIS — Z419 Encounter for procedure for purposes other than remedying health state, unspecified: Secondary | ICD-10-CM

## 2018-04-08 HISTORY — PX: ORIF WRIST FRACTURE: SHX2133

## 2018-04-08 SURGERY — OPEN REDUCTION INTERNAL FIXATION (ORIF) WRIST FRACTURE
Anesthesia: Monitor Anesthesia Care | Site: Wrist | Laterality: Right

## 2018-04-08 MED ORDER — 0.9 % SODIUM CHLORIDE (POUR BTL) OPTIME
TOPICAL | Status: DC | PRN
  Administered 2018-04-08: 1000 mL

## 2018-04-08 MED ORDER — ROCURONIUM BROMIDE 50 MG/5ML IV SOSY
PREFILLED_SYRINGE | INTRAVENOUS | Status: AC
  Filled 2018-04-08: qty 10

## 2018-04-08 MED ORDER — LIDOCAINE HCL (CARDIAC) PF 100 MG/5ML IV SOSY
PREFILLED_SYRINGE | INTRAVENOUS | Status: DC | PRN
  Administered 2018-04-08: 20 mg via INTRAVENOUS

## 2018-04-08 MED ORDER — PROPOFOL 10 MG/ML IV BOLUS
INTRAVENOUS | Status: AC
  Filled 2018-04-08: qty 20

## 2018-04-08 MED ORDER — LACTATED RINGERS IV SOLN
INTRAVENOUS | Status: DC
  Administered 2018-04-08: 08:00:00 via INTRAVENOUS

## 2018-04-08 MED ORDER — PROPOFOL 10 MG/ML IV BOLUS
INTRAVENOUS | Status: DC | PRN
  Administered 2018-04-08: 20 mg via INTRAVENOUS

## 2018-04-08 MED ORDER — FENTANYL CITRATE (PF) 250 MCG/5ML IJ SOLN
INTRAMUSCULAR | Status: AC
  Filled 2018-04-08: qty 5

## 2018-04-08 MED ORDER — FENTANYL CITRATE (PF) 100 MCG/2ML IJ SOLN
25.0000 ug | Freq: Once | INTRAMUSCULAR | Status: AC
  Administered 2018-04-08: 25 ug via INTRAVENOUS

## 2018-04-08 MED ORDER — FENTANYL CITRATE (PF) 100 MCG/2ML IJ SOLN
INTRAMUSCULAR | Status: AC
  Administered 2018-04-08: 25 ug via INTRAVENOUS
  Filled 2018-04-08: qty 2

## 2018-04-08 MED ORDER — HYDROCODONE-ACETAMINOPHEN 5-325 MG PO TABS
1.0000 | ORAL_TABLET | Freq: Four times a day (QID) | ORAL | Status: DC | PRN
  Administered 2018-04-08 (×2): 2 via ORAL
  Administered 2018-04-09 – 2018-04-10 (×5): 1 via ORAL
  Filled 2018-04-08 (×2): qty 2
  Filled 2018-04-08 (×5): qty 1

## 2018-04-08 MED ORDER — MIDAZOLAM HCL 2 MG/2ML IJ SOLN
INTRAMUSCULAR | Status: AC
  Filled 2018-04-08: qty 2

## 2018-04-08 MED ORDER — MORPHINE SULFATE (PF) 2 MG/ML IV SOLN
1.0000 mg | INTRAVENOUS | Status: DC | PRN
  Administered 2018-04-08 (×2): 1 mg via INTRAVENOUS
  Filled 2018-04-08 (×2): qty 1

## 2018-04-08 MED ORDER — CEFAZOLIN SODIUM-DEXTROSE 1-4 GM/50ML-% IV SOLN
1.0000 g | Freq: Three times a day (TID) | INTRAVENOUS | Status: AC
  Administered 2018-04-08 – 2018-04-09 (×3): 1 g via INTRAVENOUS
  Filled 2018-04-08 (×3): qty 50

## 2018-04-08 MED ORDER — SODIUM CHLORIDE 0.9 % IV SOLN
INTRAVENOUS | Status: DC | PRN
  Administered 2018-04-08: 25 ug/min via INTRAVENOUS

## 2018-04-08 MED ORDER — PROPOFOL 500 MG/50ML IV EMUL
INTRAVENOUS | Status: DC | PRN
  Administered 2018-04-08: 75 ug/kg/min via INTRAVENOUS

## 2018-04-08 MED ORDER — ROPIVACAINE HCL 7.5 MG/ML IJ SOLN
INTRAMUSCULAR | Status: DC | PRN
  Administered 2018-04-08: 15 mL via PERINEURAL

## 2018-04-08 MED ORDER — PHENYLEPHRINE HCL 10 MG/ML IJ SOLN
INTRAMUSCULAR | Status: DC | PRN
  Administered 2018-04-08: 80 ug via INTRAVENOUS
  Administered 2018-04-08: 40 ug via INTRAVENOUS
  Administered 2018-04-08 (×2): 80 ug via INTRAVENOUS

## 2018-04-08 MED ORDER — FENTANYL CITRATE (PF) 100 MCG/2ML IJ SOLN: 25.0000 ug | INTRAMUSCULAR | Status: DC | PRN

## 2018-04-08 SURGICAL SUPPLY — 69 items
BANDAGE ACE 3X5.8 VEL STRL LF (GAUZE/BANDAGES/DRESSINGS) ×3 IMPLANT
BANDAGE ACE 4X5 VEL STRL LF (GAUZE/BANDAGES/DRESSINGS) ×3 IMPLANT
BIT DRILL 2.2 SS TIBIAL (BIT) ×3 IMPLANT
BLADE CLIPPER SURG (BLADE) ×3 IMPLANT
BLADE SURG 15 STRL LF DISP TIS (BLADE) ×1 IMPLANT
BLADE SURG 15 STRL SS (BLADE) ×2
BNDG ESMARK 4X9 LF (GAUZE/BANDAGES/DRESSINGS) ×3 IMPLANT
BRUSH SCRUB SURG 4.25 DISP (MISCELLANEOUS) ×6 IMPLANT
CORD BIPOLAR FORCEPS 12FT (ELECTRODE) ×3 IMPLANT
COVER SURGICAL LIGHT HANDLE (MISCELLANEOUS) ×3 IMPLANT
DECANTER SPIKE VIAL GLASS SM (MISCELLANEOUS) IMPLANT
DRAPE C-ARM 42X72 X-RAY (DRAPES) ×3 IMPLANT
DRAPE INCISE IOBAN 66X45 STRL (DRAPES) ×3 IMPLANT
DRSG EMULSION OIL 3X3 NADH (GAUZE/BANDAGES/DRESSINGS) ×3 IMPLANT
ELECT REM PT RETURN 9FT ADLT (ELECTROSURGICAL) ×3
ELECTRODE REM PT RTRN 9FT ADLT (ELECTROSURGICAL) ×1 IMPLANT
GAUZE SPONGE 4X4 12PLY STRL (GAUZE/BANDAGES/DRESSINGS) ×3 IMPLANT
GAUZE SPONGE 4X4 16PLY XRAY LF (GAUZE/BANDAGES/DRESSINGS) ×3 IMPLANT
GLOVE BIO SURGEON STRL SZ7.5 (GLOVE) ×3 IMPLANT
GLOVE BIO SURGEON STRL SZ8 (GLOVE) ×3 IMPLANT
GLOVE BIOGEL PI IND STRL 7.5 (GLOVE) ×1 IMPLANT
GLOVE BIOGEL PI IND STRL 8 (GLOVE) ×1 IMPLANT
GLOVE BIOGEL PI INDICATOR 7.5 (GLOVE) ×2
GLOVE BIOGEL PI INDICATOR 8 (GLOVE) ×2
GOWN STRL REUS W/ TWL LRG LVL3 (GOWN DISPOSABLE) ×2 IMPLANT
GOWN STRL REUS W/ TWL XL LVL3 (GOWN DISPOSABLE) ×1 IMPLANT
GOWN STRL REUS W/TWL LRG LVL3 (GOWN DISPOSABLE) ×4
GOWN STRL REUS W/TWL XL LVL3 (GOWN DISPOSABLE) ×2
K-WIRE 1.6 (WIRE) ×6
K-WIRE FX5X1.6XNS BN SS (WIRE) ×3
KIT BASIN OR (CUSTOM PROCEDURE TRAY) ×3 IMPLANT
KIT TURNOVER KIT B (KITS) ×3 IMPLANT
KWIRE FX5X1.6XNS BN SS (WIRE) ×3 IMPLANT
LOOP VESSEL MINI RED (MISCELLANEOUS) ×3 IMPLANT
NEEDLE HYPO 25GX1X1/2 BEV (NEEDLE) IMPLANT
NS IRRIG 1000ML POUR BTL (IV SOLUTION) ×3 IMPLANT
PACK ORTHO EXTREMITY (CUSTOM PROCEDURE TRAY) ×3 IMPLANT
PAD ARMBOARD 7.5X6 YLW CONV (MISCELLANEOUS) ×6 IMPLANT
PAD CAST 3X4 CTTN HI CHSV (CAST SUPPLIES) ×1 IMPLANT
PAD CAST 4YDX4 CTTN HI CHSV (CAST SUPPLIES) ×1 IMPLANT
PADDING CAST COTTON 3X4 STRL (CAST SUPPLIES) ×2
PADDING CAST COTTON 4X4 STRL (CAST SUPPLIES) ×2
PEG LOCKING SMOOTH 2.2X18 (Peg) ×9 IMPLANT
PEG LOCKING SMOOTH 2.2X20 (Screw) ×9 IMPLANT
PLATE NARROW DVR RIGHT (Plate) ×3 IMPLANT
SCREW LOCK 12X2.7X 3 LD (Screw) ×1 IMPLANT
SCREW LOCK 16X2.7X 3 LD TPR (Screw) ×2 IMPLANT
SCREW LOCK 18X2.7X 3 LD TPR (Screw) ×1 IMPLANT
SCREW LOCKING 2.7X12MM (Screw) ×2 IMPLANT
SCREW LOCKING 2.7X13MM (Screw) ×6 IMPLANT
SCREW LOCKING 2.7X16 (Screw) ×4 IMPLANT
SCREW LOCKING 2.7X18 (Screw) ×2 IMPLANT
SLEEVE SURGEON STRL (DRAPES) ×3 IMPLANT
SLING ARM FOAM STRAP MED (SOFTGOODS) ×3 IMPLANT
SPLINT PLASTER CAST XFAST 4X15 (CAST SUPPLIES) ×1 IMPLANT
SPLINT PLASTER XTRA FAST SET 4 (CAST SUPPLIES) ×2
SUCTION FRAZIER TIP 10 FR DISP (SUCTIONS) ×3 IMPLANT
SUT ETHILON 3 0 PS 1 (SUTURE) ×6 IMPLANT
SUT PROLENE 6 0 P 1 18 (SUTURE) ×3 IMPLANT
SUT VIC AB 0 CT1 27 (SUTURE) ×4
SUT VIC AB 0 CT1 27XBRD ANBCTR (SUTURE) ×2 IMPLANT
SUT VIC AB 2-0 CT1 27 (SUTURE)
SUT VIC AB 2-0 CT1 TAPERPNT 27 (SUTURE) IMPLANT
SYR CONTROL 10ML LL (SYRINGE) IMPLANT
TOWEL OR 17X24 6PK STRL BLUE (TOWEL DISPOSABLE) ×3 IMPLANT
TOWEL OR 17X26 10 PK STRL BLUE (TOWEL DISPOSABLE) ×6 IMPLANT
TUBE CONNECTING 12'X1/4 (SUCTIONS) ×1
TUBE CONNECTING 12X1/4 (SUCTIONS) ×2 IMPLANT
UNDERPAD 30X30 (UNDERPADS AND DIAPERS) ×3 IMPLANT

## 2018-04-08 NOTE — Progress Notes (Addendum)
STROKE TEAM PROGRESS NOTE   INTERVAL HISTORY Pt just back from surgery. RN at bedside giving pain med. dtr arrived during rounds. Patient did well. Ready for CIR.  Vitals:   04/08/18 0753 04/08/18 0754 04/08/18 0755 04/08/18 0800  BP:      Pulse:   (!) 108 (!) 54  Resp: 16 16 14 16   Temp:      TempSrc:      SpO2:  100% 99% (!) 72%  Weight:      Height:        CBC:  Recent Labs  Lab 04/03/18 1816 04/05/18 0409 04/07/18 0516  WBC 13.9* 10.7* 9.9  NEUTROABS 11.9*  --   --   HGB 13.2 12.2 11.6*  HCT 40.4 37.5 36.0  MCV 93.1 93.5 93.8  PLT 249 180 242    Basic Metabolic Panel:  Recent Labs  Lab 04/05/18 0409 04/07/18 0516  NA 137 135  K 3.3* 3.1*  CL 104 107  CO2 21* 21*  GLUCOSE 97 89  BUN 10 12  CREATININE 0.80 0.74  CALCIUM 9.6 9.1  MG  --  1.8   Lipid Panel:     Component Value Date/Time   CHOL 161 04/04/2018 0414   TRIG 93 04/04/2018 0414   HDL 56 04/04/2018 0414   CHOLHDL 2.9 04/04/2018 0414   VLDL 19 04/04/2018 0414   LDLCALC 86 04/04/2018 0414   HgbA1c:  Lab Results  Component Value Date   HGBA1C 4.7 (L) 04/04/2018   Urine Drug Screen:     Component Value Date/Time   LABOPIA NONE DETECTED 11/04/2017 2039   COCAINSCRNUR NONE DETECTED 11/04/2017 2039   LABBENZ POSITIVE (A) 11/04/2017 2039   AMPHETMU NONE DETECTED 11/04/2017 2039   THCU NONE DETECTED 11/04/2017 2039   LABBARB NONE DETECTED 11/04/2017 2039    Alcohol Level No results found for: ETH   IMAGING Ct Head Code Stroke Wo Contrast 04/03/2018 1. No acute intracranial infarct or other abnormality.  2. ASPECTS is 10.  3. Atrophy with extensive chronic ischemic changes as above, stable from previous.   Ct Angio Head W Or Wo Contrast Ct Angio Neck W Or Wo Contrast Ct Cerebral Perfusion W Contrast 04/03/2018 1. Negative CTA for large vessel occlusion.  2. Failed and nondiagnostic CT perfusion.  3. Atheromatous change involving the major arterial vasculature of the head and neck,  stable relative to previous CTA from 11/04/2017. Changes most notable within the bilateral V4 segments were there are mild to moderate stenoses. No other hemodynamically significant or correctable stenosis identified.  4. Stable 3 mm focal outpouching arising from the distal right M1 segment, possibly reflecting a small aneurysm versus vascular infundibulum.  5. Tortuosity of the major arterial vasculature within the neck, suggesting chronic underlying hypertension.  Mr Brain Wo Contrast 04/04/2018 1. No acute intracranial infarct or other abnormality identified.  2. Moderately advanced cerebral atrophy with chronic small vessel ischemic disease with scattered remote infarcts as above.   Dg Forearm Right 04/03/2018 Acute impacted and minimally displaced fractures of the distal radius and ulna just proximal to the wrist joint.   Dg Wrist Complete Right 04/04/2018 Improved position and alignment of distal radius and ulnar fractures as described. No new abnormality.   EEG  Normal electroencephalogram, awake, drowsy and with activation procedures. There are no focal lateralizing or epileptiform features.  TTE 04/06/2018 - Left ventricle: The cavity size was normal. Wall thickness was increased in a pattern of mild LVH. Systolic function was normal. The  estimated ejection fraction was in the range of 60% to 65%. Wall motion was normal; there were no regional wall motion abnormalities. Doppler parameters are consistent with abnormal left ventricular relaxation (grade 1 diastolic dysfunction). - Aortic valve: Moderately calcified annulus. Trileaflet; moderately thickened leaflets. There was mild regurgitation. Valve area (VTI): 3.46 cm^2. Valve area (Vmax): 2.95 cm^2. Valve area (Vmean): 2.94 cm^2. - Mitral valve: Mildly calcified annulus. Normal thickness leaflets - Technically adequate study.   PHYSICAL EXAM Pleasant elderly Caucasian lady with right forearm bandaged due to her radius/ulna  fracture surgery today. Afebrile. Head is nontraumatic. Neck is supple without bruit. Cardiac exam no murmur or gallop. Lungs are clear to auscultation. Distal pulses are well felt. Neurological Exam :  Awake alert HOH fluent speech. Able to name and repeat. Follows three-step commands. No dysarthria. Extraocular movements are full range without nystagmus. Blinks to threat bilaterally. Fundi not visualized. Face is symmetric without weakness. Tongue midline. Right upper extremity strength testing limited due to radius fracture but no obvious focal weakness. Moves all 4 extremities symmetrically. Lower extremity extremity mild bilateral hip flexor weakness but symmetric strength. Deep tendon for this symmetric. Sensation is intact. Plantars are downgoing. Gait not tested. She has a black OS that is resolving. R hand fingers are edematous and ecchymotic with good movement and pulse.   ASSESSMENT/PLAN [name] is a 84 y.o. female with history of HTN, HLD, IBS, TIA, osteopenia, diverticulosis presenting with sudden onset expressive aphasia and right-sided weakness resulting in fall leading to right radius and ulnar fracture.   IV TPA administered 04/03/2018 at 1823.  Suspected stroke s/p tPA but MRI negative and possible seizure - Similar event in March 2019, seen at [facility] with near syncope event at the [employer] followed by diffiidulty getting her words out. MRI neg. EEG neg.   Code Stroke CT head No acute stroke. Small vessel disease. Atrophy. ASPECTS 10.     CTA head & neck no LVO.  Atherosclerosis.  R M1 outpouching (aneurysm versus vascular infundibulum).  Tortuosity of vasculature in the neck suggesting underlying HTN  CT perfusion failed and nondiagnostic  MRI no acute stroke  2D Echo  - EF 60 - 65%. No cardiac source of emboli identified.   EEG negative for seizure  On Keppra ER 500 daily  LDL 86  HgbA1c 4.7  SCDs for VTE prophylaxis  clopidogrel 75 mg daily prior to  admission, now on Plavix 75.  Continue Plavix on discharge.  Therapy recommendations: CIR  Disposition:  pending   Hope for d/c to CIR tomorrow  Distal R radius and ulnar fx following fall  Splint in place.   Ortho on board  ORIF completed today w/ nerve block  Stable post op  Hypertension  Stable  On home medications : Norvasc 5, lisinopril 20 . Long-term BP goal normotensive . Ok to given am meds now  Hyperlipidemia  Home meds: Pravachol 20, resumed in hospital  LDL 86, goal < 70  Continue statin at discharge  Other Stroke Risk Factors  Advanced age  Hx stroke/TIA  10/2017 -near syncope event with difficulty getting her words out.  Se[facility] at [facility].  MRI negative.  EEG negative.  Seizure versus TIA.  Started on Plavix  06/2014 right arm weakness. TIA. MRI neg. Placed on aspirin 325  Family hx stroke (father)  Other Active Problems  Leukocytosis, resolved 13.9 -> 10.7-> 9.9  Hypokalemia - 3.1 - supplemented - recheck in am  Hospital day # 5  Burnetta Sabin, MSN, APRN, ANVP-BC, AGPCNP-BC Advanced Practice Stroke Nurse [place] for Schedule & Pager information 04/08/2018 9:52 AM   ATTENDING NOTE: I reviewed above note and agree with the assessment and plan. I have made any additions or clarifications directly to the above note. Pt was seen and examined.   Patient doing well, no acute event overnight.  Had right wrist procedure with orthopedic surgeon this morning, tolerating well.  Ready for the CIR.   Rosalin Hawking, MD PhD Stroke Neurology 04/08/2018 6:13 PM     To contact Stroke Continuity provider, please refer to [email or web address] After hours, contact General Neurology

## 2018-04-08 NOTE — Transfer of Care (Signed)
Immediate Anesthesia Transfer of Care Note  Patient: Kellie Harris  Procedure(s) Performed: OPEN REDUCTION INTERNAL FIXATION (ORIF) WRIST FRACTURE (Right Wrist)  Patient Location: PACU  Anesthesia Type:MAC combined with regional for post-op pain  Level of Consciousness: awake, alert  and oriented  Airway & Oxygen Therapy: Patient Spontanous Breathing  Post-op Assessment: Report given to RN, Post -op Vital signs reviewed and stable and Patient moving all extremities X 4  Post vital signs: Reviewed and stable  Last Vitals:  Vitals Value Taken Time  BP 146/64 04/08/2018 10:47 AM  Temp    Pulse 135 04/08/2018 10:48 AM  Resp 17 04/08/2018 10:48 AM  SpO2 86 % 04/08/2018 10:48 AM  Vitals shown include unvalidated device data.  Last Pain:  Vitals:   04/08/18 0431  TempSrc: Oral  PainSc:       Patients Stated Pain Goal: 0 (91/91/66 0600)  Complications: No apparent anesthesia complications

## 2018-04-08 NOTE — Progress Notes (Signed)
Inpatient Rehabilitation-Admissions Coordinator   [facility] spoke with pt and family regarding preference venue for rehab. Both want to pursue CIR prior to returning home. Family has plans to put caregivers in the home for needed assistance after DC from CIR if needed. AC has begun insurance authorization process. Please call if questions.   Jhonnie Garner, OTR/L  Rehab Admissions Coordinator  [phone] 04/08/2018 5:12 PM

## 2018-04-08 NOTE — Anesthesia Procedure Notes (Signed)
Anesthesia Regional Block: Supraclavicular block   Pre-Anesthetic Checklist: ,, timeout performed, Correct Patient, Correct Site, Correct Laterality, Correct Procedure, Correct Position, site marked, Risks and benefits discussed,  Surgical consent,  Pre-op evaluation,  At surgeon's request and post-op pain management  Laterality: Right  Prep: Maximum Sterile Barrier Precautions used, chloraprep       Needles:  Injection technique: Single-shot  Needle Type: Echogenic Stimulator Needle     Needle Length: 5cm  Needle Gauge: 22     Additional Needles:   Procedures:,,,, ultrasound used (permanent image in chart),,,,  Narrative:  Start time: 04/08/2018 7:16 AM End time: 04/08/2018 7:26 AM Injection made incrementally with aspirations every 5 mL.  Performed by: Personally  Anesthesiologist: Freddrick March, MD  Additional Notes: Monitors applied. No increased pain on injection. No increased resistance to injection. Injection made in 5cc increments. Good needle visualization. Patient tolerated procedure well.

## 2018-04-08 NOTE — Anesthesia Preprocedure Evaluation (Addendum)
Anesthesia Evaluation  Patient identified by MRN, date of birth, ID band Patient awake    Reviewed: Allergy & Precautions, NPO status , Patient's Chart, lab work & pertinent test results, reviewed documented beta blocker date and time   Airway Mallampati: I  TM Distance: >3 FB Neck ROM: Full    Dental  (+) Teeth Intact, Dental Advisory Given   Pulmonary    breath sounds clear to auscultation       Cardiovascular hypertension, Pt. on medications and Pt. on home beta blockers  Rhythm:Regular Rate:Normal  TTE 04/06/18  Study Conclusions - Left ventricle: The cavity size was normal. Wall thickness was increased in a pattern of mild LVH. Systolic function was normal. The estimated ejection fraction was in the range of 60% to 65%. Wall motion was normal; there were no regional wall motion abnormalities. Doppler parameters are consistent with abnormal left ventricular relaxation (grade 1 diastolic dysfunction). - Aortic valve: Moderately calcified annulus. Trileaflet; moderately thickened leaflets. There was mild regurgitation. Valve area (VTI): 3.46 cm^2. Valve area (Vmax): 2.95 cm^2. Valve area (Vmean): 2.94 cm^2. - Mitral valve: Mildly calcified annulus. Normal thickness leaflets   Neuro/Psych CVA, No Residual Symptoms negative psych ROS   GI/Hepatic Neg liver ROS, hiatal hernia,   Endo/Other  negative endocrine ROS  Renal/GU negative Renal ROS  negative genitourinary   Musculoskeletal  (+) Arthritis , Osteoarthritis,    Abdominal   Peds  Hematology negative hematology ROS (+)   Anesthesia Other Findings S/p fall   On plavix for CVA  Reproductive/Obstetrics                           Anesthesia Physical Anesthesia Plan  ASA: III  Anesthesia Plan: Regional and MAC   Post-op Pain Management:    Induction: Intravenous  PONV Risk Score and Plan: 2 and Propofol infusion, Dexamethasone,  Ondansetron and Treatment may vary due to age or medical condition  Airway Management Planned: Natural Airway and Simple Face Mask  Additional Equipment:   Intra-op Plan:   Post-operative Plan:   Informed Consent: I have reviewed the patients History and Physical, chart, labs and discussed the procedure including the risks, benefits and alternatives for the proposed anesthesia with the patient or authorized representative who has indicated his/her understanding and acceptance.   Dental advisory given  Plan Discussed with: CRNA  Anesthesia Plan Comments:        Anesthesia Quick Evaluation

## 2018-04-08 NOTE — Progress Notes (Signed)
During nerve block SPO2 not accurately reading. Used ETCO2 to monitor respiratory status during block. Patient remained alert and oriented during block.

## 2018-04-09 ENCOUNTER — Encounter (HOSPITAL_COMMUNITY): Payer: Self-pay | Admitting: Orthopedic Surgery

## 2018-04-09 DIAGNOSIS — S52501D Unspecified fracture of the lower end of right radius, subsequent encounter for closed fracture with routine healing: Secondary | ICD-10-CM

## 2018-04-09 LAB — BASIC METABOLIC PANEL
ANION GAP: 7 (ref 5–15)
BUN: 10 mg/dL (ref 8–23)
CO2: 22 mmol/L (ref 22–32)
Calcium: 9.6 mg/dL (ref 8.9–10.3)
Chloride: 105 mmol/L (ref 98–111)
Creatinine, Ser: 0.8 mg/dL (ref 0.44–1.00)
GFR calc Af Amer: >60 mL/min (ref >60)
GFR calc non Af Amer: >60 mL/min (ref >60)
GLUCOSE: 105 mg/dL — AB (ref 70–99)
Potassium: 3.8 mmol/L (ref 3.5–5.1)
Sodium: 134 mmol/L — ABNORMAL LOW (ref 135–145)

## 2018-04-09 MED ORDER — HYDROCODONE-ACETAMINOPHEN 5-325 MG PO TABS: 1.0000 | ORAL_TABLET | Freq: Four times a day (QID) | ORAL | 0 refills | Status: DC | PRN

## 2018-04-09 MED ORDER — LEVETIRACETAM ER 500 MG PO TB24: 500.0000 mg | ORAL_TABLET | Freq: Every day | ORAL | Status: DC

## 2018-04-09 NOTE — Discharge Summary (Addendum)
Stroke Discharge Summary  Patient ID: Kellie Harris    l   MRN: [id number]      DOB: 08-22-1930  Date of Admission: 04/03/2018 Date of Discharge: 04/10/2018  Attending Physician:  Rosalin Hawking, MD, Stroke MD Consultant(s):  Altamese [place], MD (orthopedic surgery), Delice Lesch, MD (Physical Medicine & Rehabtilitation)  Patient's PCP:  Dion Body, MD  Discharge Diagnoses:  Principal Problem:   Stroke-like episode [facility]) s/p IV tPA Active Problems:   HLD (hyperlipidemia)   Distal radius and ulna fracture, right   Benign essential HTN   Tachycardia   Leukocytosis   Seizures (HCC)   S/P ORIF (open reduction internal fixation) fracture radius and ulnar fx 04/08/18  Past Medical History:  Diagnosis Date  . Arthritis    right knee, left shoulder/ OSTEO  . Cancer ([place])    skin   . Degenerative disc disease, lumbar    L4-L5  . Diverticulosis    ITIS  . History of hiatal hernia   . Hydronephrosis    right  . Hypercholesteremia   . Hypertension    CONTROLLED ON MEDS  . IBS (irritable bowel syndrome)   . Knee fracture, right 03/05/2015   DIFFICULTY WITH AMBULATION  . Osteopenia   . Ovarian cyst   . Pancreatic insufficiency   . Wears hearing aid    bilateral   Past Surgical History:  Procedure Laterality Date  . CATARACT EXTRACTION W/PHACO Right 11/02/2015   Procedure: CATARACT EXTRACTION PHACO AND INTRAOCULAR LENS PLACEMENT (IOC);  Surgeon: Leandrew Koyanagi, MD;  Location: [place];  Service: Ophthalmology;  Laterality: Right;  . CATARACT EXTRACTION W/PHACO Left 07/18/2016   Procedure: CATARACT EXTRACTION PHACO AND INTRAOCULAR LENS PLACEMENT (IOC);  Surgeon: Leandrew Koyanagi, MD;  Location: [place];  Service: Ophthalmology;  Laterality: Left;  . COLONOSCOPY    . FRACTURE SURGERY Left    hip  . INTRAMEDULLARY (IM) NAIL INTERTROCHANTERIC Left 02/12/2017   Procedure: INTRAMEDULLARY (IM) NAIL INTERTROCHANTRIC;  Surgeon: Earnestine Leys, MD;  Location:  [facility];  Service: Orthopedics;  Laterality: Left;  . kidney stent    . OOPHORECTOMY Bilateral 2015  . ORIF WRIST FRACTURE Right 04/08/2018   Procedure: OPEN REDUCTION INTERNAL FIXATION (ORIF) WRIST FRACTURE;  Surgeon: Altamese , MD;  Location: [place];  Service: Orthopedics;  Laterality: Right;  . TONSILLECTOMY    . TOTAL KNEE ARTHROPLASTY Right 08/07/2017   Procedure: TOTAL KNEE ARTHROPLASTY;  Surgeon: Earnestine Leys, MD;  Location: [facility];  Service: Orthopedics;  Laterality: Right;    Medications to be continued on Rehab Allergies as of 04/10/2018   No Known Allergies     Medication List    TAKE these medications   acetaminophen 500 MG tablet Commonly known as:  TYLENOL Take 500-1,000 mg by mouth every 6 (six) hours as needed (for pain or headaches).   amLODipine 5 MG tablet Commonly known as:  NORVASC Take 5 mg by mouth daily.   amoxicillin 500 MG capsule Commonly known as:  AMOXIL Take 2,000 mg by mouth See admin instructions. Take 2,000 mg by mouth one hour prior to dental procedures   clopidogrel 75 MG tablet Commonly known as:  PLAVIX Take 75 mg by mouth daily.   gabapentin 100 MG capsule Commonly known as:  NEURONTIN Take 1 capsule (100 mg total) by mouth 3 (three) times daily.   HYDROcodone-acetaminophen 5-325 MG tablet Commonly known as:  NORCO/VICODIN Take 1-2 tablets by mouth every 6 (six) hours as needed for moderate pain or severe  pain.   levETIRAcetam 500 MG 24 hr tablet Commonly known as:  KEPPRA XR Take 1 tablet (500 mg total) by mouth daily.   lisinopril 20 MG tablet Commonly known as:  PRINIVIL,ZESTRIL Take 20 mg by mouth 2 (two) times daily.   pravastatin 20 MG tablet Commonly known as:  PRAVACHOL Take 1 tablet (20 mg total) by mouth at bedtime.   Probiotic Caps Take 1 capsule by mouth daily as needed (when IBS flares).       LABORATORY STUDIES CBC    Component Value Date/Time   WBC 9.9 04/07/2018 0516   RBC 3.84 (L) 04/07/2018  0516   HGB 11.6 (L) 04/07/2018 0516   HGB 12.5 07/10/2014 0530   HCT 36.0 04/07/2018 0516   HCT 37.5 07/10/2014 0530   PLT 203 04/07/2018 0516   PLT 243 07/10/2014 0530   MCV 93.8 04/07/2018 0516   MCV 90 07/10/2014 0530   MCH 30.2 04/07/2018 0516   MCHC 32.2 04/07/2018 0516   RDW 13.0 04/07/2018 0516   RDW 13.7 07/10/2014 0530   LYMPHSABS 0.9 04/03/2018 1816   LYMPHSABS 1.7 07/10/2014 0530   MONOABS 0.8 04/03/2018 1816   MONOABS 0.7 07/10/2014 0530   EOSABS 0.2 04/03/2018 1816   EOSABS 0.5 07/10/2014 0530   BASOSABS 0.1 04/03/2018 1816   BASOSABS 0.1 07/10/2014 0530   CMP    Component Value Date/Time   NA 134 (L) 04/09/2018 0439   NA 138 07/10/2014 0530   K 3.8 04/09/2018 0439   K 3.8 07/10/2014 0530   CL 105 04/09/2018 0439   CL 105 07/10/2014 0530   CO2 22 04/09/2018 0439   CO2 26 07/10/2014 0530   GLUCOSE 105 (H) 04/09/2018 0439   GLUCOSE 97 07/10/2014 0530   BUN 10 04/09/2018 0439   BUN 13 07/10/2014 0530   CREATININE 0.80 04/09/2018 0439   CREATININE 0.96 07/10/2014 0530   CALCIUM 9.6 04/09/2018 0439   CALCIUM 10.1 07/10/2014 0530   PROT 6.1 (L) 04/03/2018 1816   PROT 7.7 07/08/2014 2102   ALBUMIN 3.9 04/03/2018 1816   ALBUMIN 4.2 07/08/2014 2102   AST 22 04/03/2018 1816   AST 20 07/08/2014 2102   ALT 11 04/03/2018 1816   ALT 24 07/08/2014 2102   ALKPHOS 69 04/03/2018 1816   ALKPHOS 90 07/08/2014 2102   BILITOT 1.0 04/03/2018 1816   BILITOT 0.3 07/08/2014 2102   GFRNONAA >60 04/09/2018 0439   GFRNONAA 59 (L) 07/10/2014 0530   GFRNONAA >60 07/02/2013 2228   GFRAA >60 04/09/2018 0439   GFRAA >60 07/10/2014 0530   GFRAA >60 07/02/2013 2228   COAGS Lab Results  Component Value Date   INR 1.04 04/03/2018   INR 0.99 11/04/2017   INR 1.00 08/24/2017   Lipid Panel    Component Value Date/Time   CHOL 161 04/04/2018 0414   TRIG 93 04/04/2018 0414   HDL 56 04/04/2018 0414   CHOLHDL 2.9 04/04/2018 0414   VLDL 19 04/04/2018 0414   LDLCALC 86  04/04/2018 0414   HgbA1C  Lab Results  Component Value Date   HGBA1C 4.7 (L) 04/04/2018   Urinalysis    Component Value Date/Time   COLORURINE STRAW (A) 11/04/2017 2039   APPEARANCEUR CLEAR (A) 11/04/2017 2039   APPEARANCEUR Clear 07/08/2014 2155   LABSPEC 1.014 11/04/2017 2039   LABSPEC 1.003 07/08/2014 2155   PHURINE 7.0 11/04/2017 2039   GLUCOSEU NEGATIVE 11/04/2017 2039   GLUCOSEU Negative 07/08/2014 2155   HGBUR MODERATE (A) 11/04/2017 2039  BILIRUBINUR NEGATIVE 11/04/2017 2039   BILIRUBINUR Negative 07/08/2014 2155   KETONESUR NEGATIVE 11/04/2017 2039   PROTEINUR NEGATIVE 11/04/2017 2039   NITRITE NEGATIVE 11/04/2017 2039   LEUKOCYTESUR TRACE (A) 11/04/2017 2039   LEUKOCYTESUR Negative 07/08/2014 2155   Urine Drug Screen     Component Value Date/Time   LABOPIA NONE DETECTED 11/04/2017 2039   COCAINSCRNUR NONE DETECTED 11/04/2017 2039   LABBENZ POSITIVE (A) 11/04/2017 2039   AMPHETMU NONE DETECTED 11/04/2017 2039   THCU NONE DETECTED 11/04/2017 2039   LABBARB NONE DETECTED 11/04/2017 2039     SIGNIFICANT DIAGNOSTIC STUDIES Ct Head Code Stroke Wo Contrast 04/03/2018 1. No acute intracranial infarct or other abnormality.  2. ASPECTS is 10.  3. Atrophy with extensive chronic ischemic changes as above, stable from previous.   Ct Angio Head W Or Wo Contrast Ct Angio Neck W Or Wo Contrast Ct Cerebral Perfusion W Contrast 04/03/2018 1. Negative CTA for large vessel occlusion.  2. Failed and nondiagnostic CT perfusion.  3. Atheromatous change involving the major arterial vasculature of the head and neck, stable relative to previous CTA from 11/04/2017. Changes most notable within the bilateral V4 segments were there are mild to moderate stenoses. No other hemodynamically significant or correctable stenosis identified.  4. Stable 3 mm focal outpouching arising from the distal right M1 segment, possibly reflecting a small aneurysm versus vascular infundibulum.  5.  Tortuosity of the major arterial vasculature within the neck, suggesting chronic underlying hypertension.  Mr Brain Wo Contrast 04/04/2018 1. No acute intracranial infarct or other abnormality identified.  2. Moderately advanced cerebral atrophy with chronic small vessel ischemic disease with scattered remote infarcts as above.   Dg Forearm Right 04/03/2018 Acute impacted and minimally displaced fractures of the distal radius and ulna just proximal to the wrist joint.   Dg Wrist Complete Right 04/04/2018 Improved position and alignment of distal radius and ulnar fractures as described. No new abnormality.   EEG  Normal electroencephalogram, awake, drowsy and with activation procedures. There are no focal lateralizing or epileptiform features.  TTE 04/06/2018 - Left ventricle: The cavity size was normal. Wall thickness wasincreased in a pattern of mild LVH. Systolic function was normal.The estimated ejection fraction was in the range of 60% to 65%.Wall motion was normal; there were no regional wall motionabnormalities. Doppler parameters are consistent with abnormalleft ventricular relaxation (grade 1 diastolic dysfunction). - Aortic valve: Moderately calcified annulus. Trileaflet;moderately thickened leaflets. There was mild regurgitation.Valve area (VTI): 3.46 cm^2. Valve area (Vmax): 2.95 cm^2. Valvearea (Vmean): 2.94 cm^2. - Mitral valve: Mildly calcified annulus. Normal thickness leaflets - Technically adequate study.    HISTORY OF PRESENT ILLNESS Kellie Harris is an 84 y.o. female presenting via EMS after sudden onset of expressive aphasia with right sided weakness resulting in a fall from a standing position onto her right wrist. She was LKW 04/03/2018 at 1645. BP per EMS was 161/75, HR 85 and CBG 157. She has a history of TIAs with recent stroke work up completed in April, per daughter, which was inconclusive. She was started on Plavix at that time. She is not on a blood  thinner.  Daughter reports no bleeding problems, history of intracranial hemorrhage or recent operations. She was administered IV tPA and admitted for further workup and evaluation.   HOSPITAL COURSE [name] is a 84 y.o. female with history of HTN, HLD, IBS, TIA, osteopenia, diverticulosis presenting with sudden onset expressive aphasia and right-sided weakness resulting in fall leading to right  radius and ulnar fracture.  IV TPA administered 04/03/2018 at 1823. She tolerated tPA without difficulties. MRI was negative for stroke. Given repetitive episodes, it is thought she had a seizure and was started on Keppra. Once stable post tPA and stroke workup, she was taking for ORIF of R wrist. She tolerated well. Will continue plavix. Stable for rehab.  Stroke-like episode s/p tPA  Possible seizure  Similar event in March 2019, seen at [facility] with near syncope event at the [employer] followed by diffiiculty getting her words out. MRI neg. EEG neg.   Code Stroke CT head No acute stroke. Small vessel disease. Atrophy. ASPECTS 10.     CTA head & neck no LVO.  Atherosclerosis.  R M1 outpouching (aneurysm versus vascular infundibulum).  Tortuosity of vasculature in the neck suggesting underlying HTN  CT perfusion failed and nondiagnostic  MRI no acute stroke  2D Echo  - EF 60 - 65%. No cardiac source of emboli identified.   EEG negative for seizure  On Keppra ER 500 daily  LDL 86  HgbA1c 4.7  clopidogrel 75 mg daily prior to admission, treated with Plavix 75 in hospital.  Continue Plavix on discharge.  Therapy recommendations: CIR->SNF (unable to get insurance approval for CIR)  Disposition:  SNF  Distal R radius and ulnar fx following fall  Ortho on board  ORIF completed 04/08/2018 w/ nerve block  Stable post op  Follow up Dr. Marcelino Scot in 2 weeks  Hypertension  Stable  On home medications : Norvasc 5, lisinopril 20  BP goal normotensive  Hyperlipidemia  Home meds:  Pravachol 20, resumed in hospital  LDL 86, goal < 70  Continue statin at discharge  Other Stroke Risk Factors  Advanced age  Hx stroke/TIA ? 10/2017 -near syncope event with difficulty getting her words out.  Seen at [facility].  MRI negative.  EEG negative.  Seizure versus TIA.  Started on Plavix ? 06/2014 right arm weakness. TIA. MRI neg. Placed on aspirin 325  Family hx stroke (father)  Other Active Problems  Leukocytosis, resolved 13.9 -> 10.7-> 9.9  Hypokalemia, resolved - 3.1 -> 3.8  Possible fractured 3rd toe on L foot   DISCHARGE EXAM Blood pressure 112/78, pulse 87, temperature 97.7 F (36.5 C), temperature source Oral, resp. rate 16, height 4\' 11"  (1.499 m), weight 44.7 kg, SpO2 93 %. Pleasant elderly Caucasian lady with right forearm bandaged due to her radius/ulna fracture surgery, up in arm elevator. Dressing CD&I. Afebrile. Head is nontraumatic. Neck is supple without bruit. Cardiac exam no murmur or gallop. Lungs are clear to auscultation. Distal pulses are well felt. Neurological Exam :  Awake alert HOH fluent speech. Able to name and repeat. Follows three-step commands. No dysarthria. Speech fluency normalized today. Extraocular movements are full range without nystagmus. Blinks to threat bilaterally. Fundi not visualized. Face is symmetric without weakness. Tongue midline. Right upper extremity strength testing limited due to radius fracture but no obvious focal weakness -can move and grip. Moves all 4 extremities symmetrically. Lower extremity extremity mild bilateral hip flexor weakness but symmetric strength. Deep tendon for this symmetric. Sensation is intact. Plantars are downgoing. Gait not tested. Her black OS is resolving, mostly yellow now. R hand fingers and top of hand edematous, ecchymosis resolving. good movement and pulse R hand as able. 3rd toe L foot reddened at base, tender to touch.  Discharge Diet  Regular thin  liquids  DISCHARGE  PLAN  Disposition:  Discharge to skilled nursing facility for ongoing  PT and OT  clopidogrel 75 mg daily for secondary stroke prevention.  Monitor Keppra tolerance. Seizure precautions.   Recommend ongoing risk factor control by Primary Care Physician at time of discharge from inpatient rehabilitation.  Follow-up Dion Body, MD in 2 weeks following discharge from rehab.   Follow-up Dr. Marcelino Scot in 2 weeks.  Follow-up in [facility] with Dr. Leonie Man in 4-6 weeks following discharge from rehab, office to schedule an appointment.   45 minutes were spent preparing discharge.  Burnetta Sabin, MSN, APRN, ANVP-BC, AGPCNP-BC Advanced Practice Stroke Nurse [place] for Schedule & Pager information 04/10/2018 1:44 PM   ATTENDING NOTE: I reviewed above note and agree with the assessment and plan. I have made any additions or clarifications directly to the above note. Pt was seen and examined.   Pt no acute event overnight. Doing well with PT/OT and not candidate for CIR. Will discharge to SNF. Follow up with Dr. Leonie Man at [facility] in 4-6 weeks.   Rosalin Hawking, MD PhD Stroke Neurology 04/10/2018 4:57 PM

## 2018-04-09 NOTE — Progress Notes (Signed)
  Speech Language Pathology Treatment: Cognitive-Linquistic  Patient Details Name: Kellie Harris MRN: [id number] DOB: 1931-04-25 Today's Date: 04/09/2018 Time: 4327-6147 SLP Time Calculation (min) (ACUTE ONLY): 21 min  Assessment / Plan / Recommendation Clinical Impression  The Montreal Cognitive Assessment (MoCA) was administered for diagnostic treatment. Pt scored 22/30 (n=26+/30) indicating possible cognitive deficits. Points were lost on the following subtests: executive function, immediate and delayed recall, thought organization, orientation (inaccurate for time of day), and visuoperception (identified 1/10 objects). This raises concern for pt safety with independent living, given deficits in memory and higher level cognition. Recommend supervision immediately after acute DC to maximize safety and independence.     HPI HPI: Pt is an 84 yo female who presented with acute onset aphasia and R sided weakness, which resulted in a fall on her R wrist with distal radius and ulna fx.  CT head negative; MRI pending. Pt is s/p tPA. Pt had a similar episode in March 2019, at which time she was seen by SLP with no signs of cognitive-linguistic impairment when accounting for pt's difficulty hearing. PMH: HTN, HH, skin cancer, arthritis     SLP Plan  Continue with current plan of care    Recommendations  Follow up Recommendations: 24 hour supervision/assistance;Other (comment) SLP Visit Diagnosis: Cognitive communication deficit (W92.957) Plan: Continue with current plan of care                Ardie Mclennan B. Quentin Ore [facility], CCC-SLP Speech Language Pathologist [phone]  Shonna Chock 04/09/2018, 11:28 AM

## 2018-04-09 NOTE — Progress Notes (Signed)
Orthopedic Trauma Service Progress Note   Patient ID: Kellie Harris MRN: [id number] DOB/AGE: 05-04-1931 84 y.o.  Subjective:  Doing well Pain is tolerable Block wore off in the middle of the night as per usual She is sitting up in the chair with her arm elevated Denies any numbness or tingling in her right upper extremity   ROS As above Objective:   VITALS:   Vitals:   04/08/18 1554 04/08/18 1939 04/09/18 0427 04/09/18 0907  BP: (!) 144/71 137/81 123/77 123/74  Pulse: 72 93 84 89  Resp: 15 18 16 19   Temp: 97.8 F (36.6 C) 98 F (36.7 C) 98.6 F (37 C) 98.1 F (36.7 C)  TempSrc: Oral Oral Oral Oral  SpO2: 99% 99% 93% 91%  Weight:      Height:        Estimated body mass index is 19.9 kg/m as calculated from the following:   Height as of this encounter: 4\' 11"  (1.499 m).   Weight as of this encounter: 44.7 kg.   Intake/Output      08/27 0701 - 08/28 0700 08/28 0701 - 08/29 0700   P.O. 120 120   I.V. (mL/kg) 1091.8 (24.4)    IV Piggyback  131.5   Total Intake(mL/kg) 1211.8 (27.1) 251.5 (5.6)   Urine (mL/kg/hr) 0 (0)    Blood 50    Total Output 50    Net +1161.8 +251.5        Urine Occurrence 3 x      LABS  Results for orders placed or performed during the hospital encounter of 04/03/18 (from the past 24 hour(s))  Basic metabolic panel     Status: Abnormal   Collection Time: 04/09/18  4:39 AM  Result Value Ref Range   Sodium 134 (L) 135 - 145 mmol/L   Potassium 3.8 3.5 - 5.1 mmol/L   Chloride 105 98 - 111 mmol/L   CO2 22 22 - 32 mmol/L   Glucose, Bld 105 (H) 70 - 99 mg/dL   BUN 10 8 - 23 mg/dL   Creatinine, Ser 0.80 0.44 - 1.00 mg/dL   Calcium 9.6 8.9 - 10.3 mg/dL   GFR calc non Af Amer >60 >60 mL/min   GFR calc Af Amer >60 >60 mL/min   Anion gap 7 5 - 15     PHYSICAL EXAM:   Gen: Sitting in bedside chair, no acute distress, arm is elevated in pillow Lungs: Breathing is unlabored Cardiac: Regular Ext:       Right upper  extremity  Splinted is fitting well  Extremity is cool but good perfusion distally.  Her left hand is cool as well  Excellent active finger motion  Radial, ulnar, median nerve motor and sensory functions are intact.  AIN and PIN motor functions are intact  No pain with passive stretching of her fingers  Elbow is nontender    Assessment/Plan: 1 Day Post-Op   Principal Problem:   Stroke-like episode ([place]) s/p IV tPA Active Problems:   HLD (hyperlipidemia)   Distal radius and ulna fracture, right   Benign essential HTN   Tachycardia   Leukocytosis   Seizures (HCC)   Anti-infectives (From admission, onward)   Start     Dose/Rate Route Frequency Ordered Stop   04/08/18 1630  ceFAZolin (ANCEF) IVPB 1 g/50 mL premix     1 g 100 mL/hr over 30 Minutes Intravenous Every 8 hours 04/08/18 1234 04/09/18 0911   04/08/18 0600  ceFAZolin (ANCEF) IVPB 2g/100 mL  premix     2 g 200 mL/hr over 30 Minutes Intravenous On call to O.R. 04/07/18 1620 04/08/18 0830    .  POD/HD#: 56  84 year old female s/p fall  -Right distal radius and ulna fracture  Patient will remain in her splint for 2 weeks and then likely conversion to a short arm cast  Nonweightbearing through her wrist for the next 6 to 8 weeks  Okay to weight-bear through her right elbow to use a platform walker  Finger and elbow motion as tolerated  Ice and elevate for swelling and pain control   - Dispo:  Per primary service  Ortho issues are stable  Follow-up with orthopedics in 2 weeks    Jari Pigg, PA-C [facility] [phone]) [phone] Kellie Harris (C) 04/09/2018, 1:04 PM

## 2018-04-09 NOTE — Progress Notes (Addendum)
STROKE TEAM PROGRESS NOTE   INTERVAL HISTORY Pt up in chair at bedside. Arm in elevator. States she is ok, but is tired. Hope for d/c to CIR. She does not feel she will be able to return home alone - will need someone with her.   Vitals:   04/08/18 1554 04/08/18 1939 04/09/18 0427 04/09/18 0907  BP: (!) 144/71 137/81 123/77 123/74  Pulse: 72 93 84 89  Resp: 15 18 16 19   Temp: 97.8 F (36.6 C) 98 F (36.7 C) 98.6 F (37 C) 98.1 F (36.7 C)  TempSrc: Oral Oral Oral Oral  SpO2: 99% 99% 93% 91%  Weight:      Height:        CBC:  Recent Labs  Lab 04/03/18 1816 04/05/18 0409 04/07/18 0516  WBC 13.9* 10.7* 9.9  NEUTROABS 11.9*  --   --   HGB 13.2 12.2 11.6*  HCT 40.4 37.5 36.0  MCV 93.1 93.5 93.8  PLT 249 180 284    Basic Metabolic Panel:  Recent Labs  Lab 04/07/18 0516 04/09/18 0439  NA 135 134*  K 3.1* 3.8  CL 107 105  CO2 21* 22  GLUCOSE 89 105*  BUN 12 10  CREATININE 0.74 0.80  CALCIUM 9.1 9.6  MG 1.8  --    Lipid Panel:     Component Value Date/Time   CHOL 161 04/04/2018 0414   TRIG 93 04/04/2018 0414   HDL 56 04/04/2018 0414   CHOLHDL 2.9 04/04/2018 0414   VLDL 19 04/04/2018 0414   LDLCALC 86 04/04/2018 0414   HgbA1c:  Lab Results  Component Value Date   HGBA1C 4.7 (L) 04/04/2018   Urine Drug Screen:     Component Value Date/Time   LABOPIA NONE DETECTED 11/04/2017 2039   COCAINSCRNUR NONE DETECTED 11/04/2017 2039   LABBENZ POSITIVE (A) 11/04/2017 2039   AMPHETMU NONE DETECTED 11/04/2017 2039   THCU NONE DETECTED 11/04/2017 2039   LABBARB NONE DETECTED 11/04/2017 2039    Alcohol Level No results found for: ETH   IMAGING Ct Head Code Stroke Wo Contrast 04/03/2018 1. No acute intracranial infarct or other abnormality.  2. ASPECTS is 10.  3. Atrophy with extensive chronic ischemic changes as above, stable from previous.   Ct Angio Head W Or Wo Contrast Ct Angio Neck W Or Wo Contrast Ct Cerebral Perfusion W Contrast 04/03/2018 1.  Negative CTA for large vessel occlusion.  2. Failed and nondiagnostic CT perfusion.  3. Atheromatous change involving the major arterial vasculature of the head and neck, stable relative to previous CTA from 11/04/2017. Changes most notable within the bilateral V4 segments were there are mild to moderate stenoses. No other hemodynamically significant or correctable stenosis identified.  4. Stable 3 mm focal outpouching arising from the distal right M1 segment, possibly reflecting a small aneurysm versus vascular infundibulum.  5. Tortuosity of the major arterial vasculature within the neck, suggesting chronic underlying hypertension.  Mr Brain Wo Contrast 04/04/2018 1. No acute intracranial infarct or other abnormality identified.  2. Moderately advanced cerebral atrophy with chronic small vessel ischemic disease with scattered remote infarcts as above.   Dg Forearm Right 04/03/2018 Acute impacted and minimally displaced fractures of the distal radius and ulna just proximal to the wrist joint.   Dg Wrist Complete Right 04/04/2018 Improved position and alignment of distal radius and ulnar fractures as described. No new abnormality.   EEG  Normal electroencephalogram, awake, drowsy and with activation procedures. There are no focal lateralizing  or epileptiform features.  TTE 04/06/2018 - Left ventricle: The cavity size was normal. Wall thickness was increased in a pattern of mild LVH. Systolic function was normal. The estimated ejection fraction was in the range of 60% to 65%. Wall motion was normal; there were no regional wall motion abnormalities. Doppler parameters are consistent with abnormal left ventricular relaxation (grade 1 diastolic dysfunction). - Aortic valve: Moderately calcified annulus. Trileaflet; moderately thickened leaflets. There was mild regurgitation. Valve area (VTI): 3.46 cm^2. Valve area (Vmax): 2.95 cm^2. Valve area (Vmean): 2.94 cm^2. - Mitral valve: Mildly calcified  annulus. Normal thickness leaflets - Technically adequate study.   PHYSICAL EXAM Pleasant elderly Caucasian lady with right forearm bandaged due to her radius/ulna fracture surgery, up in elevator. Dressing CD&I. Afebrile. Head is nontraumatic. Neck is supple without bruit. Cardiac exam no murmur or gallop. Lungs are clear to auscultation. Distal pulses are well felt. Neurological Exam :  Awake alert HOH fluent speech. Able to name and repeat. Follows three-step commands. No dysarthria. Speech slow and hypophonic today. Extraocular movements are full range without nystagmus. Blinks to threat bilaterally. Fundi not visualized. Face is symmetric without weakness. Tongue midline. Right upper extremity strength testing limited due to radius fracture but no obvious focal weakness. Moves all 4 extremities symmetrically. Lower extremity extremity mild bilateral hip flexor weakness but symmetric strength. Deep tendon for this symmetric. Sensation is intact. Plantars are downgoing. Gait not tested. Her black OS is resolving, mostly yellow now. R hand fingers are edematous and ecchymotic with good movement and pulse. 3rd toe L foot reddened at base, tender to touch.   ASSESSMENT/PLAN [name] is a 84 y.o. female with history of HTN, HLD, IBS, TIA, osteopenia, diverticulosis presenting with sudden onset expressive aphasia and right-sided weakness resulting in fall leading to right radius and ulnar fracture.   IV TPA administered 04/03/2018 at 1823.  Suspected stroke s/p tPA but MRI negative and possible seizure - Similar event in March 2019, seen at [facility] with near syncope event at the [employer] followed by diffiidulty getting her words out. MRI neg. EEG neg.   Code Stroke CT head No acute stroke. Small vessel disease. Atrophy. ASPECTS 10.     CTA head & neck no LVO.  Atherosclerosis.  R M1 outpouching (aneurysm versus vascular infundibulum).  Tortuosity of vasculature in the neck suggesting  underlying HTN  CT perfusion failed and nondiagnostic  MRI no acute stroke  2D Echo  - EF 60 - 65%. No cardiac source of emboli identified.   EEG negative for seizure  On Keppra ER 500 daily  LDL 86  HgbA1c 4.7  SCDs for VTE prophylaxis  clopidogrel 75 mg daily prior to admission, now on Plavix 75.  Continue Plavix on discharge.  Therapy recommendations: CIR  Disposition:  pending   Hope for d/c to CIR later today. Discussed with CM possibility of CIR denial.   Distal R radius and ulnar fx following fall  Splint in place.   Ortho on board  ORIF 8/27 w/ nerve block  Stable post op  Hypertension  Stable  On home medications : Norvasc 5, lisinopril 20 . BP goal normotensive  Hyperlipidemia  Home meds: Pravachol 20, resumed in hospital  LDL 86, goal < 70  Continue statin at discharge  Other Stroke Risk Factors  Advanced age  Hx stroke/TIA  10/2017 -near syncope event with difficulty getting her words out.  Seen at [facility].  MRI negative.  EEG negative.  Seizure versus  TIA.  Started on Plavix  06/2014 right arm weakness. TIA. MRI neg. Placed on aspirin 325  Family hx stroke (father)  Other Active Problems  Leukocytosis, resolved 13.9 -> 10.7-> 9.9  Hypokalemia, resolved  - 3.8 - after supplementation  Hospital day # [place], MSN, APRN, ANVP-BC, AGPCNP-BC Advanced Practice Stroke Nurse [place] for Schedule & Pager information 04/09/2018 12:16 PM   ATTENDING NOTE: I reviewed above note and agree with the assessment and plan. I have made any additions or clarifications directly to the above note. Pt was seen and examined.   Pt sitting in chair with right arm elevated in sponge box. Daughter at bedside. Denies right arm pain at this time. Feels tired today. No acute event overnight. Daughter upset about insurance taking so long for CIR approval. PT/OT recommend CIR so far and will wait for insurance approval at this  time.    Rosalin Hawking, MD PhD Stroke Neurology 04/09/2018 5:42 PM    To contact Stroke Continuity provider, please refer to [email or web address] After hours, contact General Neurology

## 2018-04-09 NOTE — Consult Note (Signed)
   [facility] CM Inpatient Consult   04/09/2018  Kellie Harris Mar 26, 1931 [id number]    Patient screened for potential [facility] Care Management program services.   Spoke with inpatient RNCM. Patient being worked up for [employer]  Will continue to follow along for disposition and progression.   Marthenia Rolling, MSN-Ed, RN,BSN [place] Liaison [phone]

## 2018-04-09 NOTE — Progress Notes (Signed)
Occupational Therapy Treatment Patient Details Name: Kellie Harris MRN: [id number] DOB: 03/27/1931 Today's Date: 04/09/2018    History of present illness 84 y.o. female admitted on 04/03/18 for acute aphasia and R sided weakness.  CT was negative, so tPA given. Suspected L hemisphere stroke.  Pt also fell sustaining a R wrist fx due to have ORIF on 04/08/18 and is NWB pre op and wbat elbow only post op.  Pt with significant PMH of HOH (wears hearing aids), osteopenia, R knee fx, HTN, DDD (lumbar), R TKA, IM nail L hip, and bil cataract surgery.   OT comments  Pt completed sink level grooming, bed mobility and toilet transfer. Pt demonstrates > 4 step turn with RW and increased time. Pt demonstrates fall risk with all adls and educated on use of R platform RW this session. Pt asking if she could progress home sooner than 2 weeks. Pt educated that d/c from therapy is dependent on meeting goals and encouraged to focus on R UE edema management.   Follow Up Recommendations  CIR    Equipment Recommendations  None recommended by OT    Recommendations for Other Services Rehab consult    Precautions / Restrictions Precautions Precautions: Fall Precaution Comments: BP restrictions no >180/>105 Restrictions Weight Bearing Restrictions: Yes RUE Weight Bearing: Weight bear through elbow only       Mobility Bed Mobility Overal bed mobility: Needs Assistance Bed Mobility: Supine to Sit     Supine to sit: Min guard     General bed mobility comments: pt requires bed elevated and incr time. pt holding R UE with L UE  Transfers Overall transfer level: Needs assistance Equipment used: Rolling walker (2 wheeled);Right platform walker Transfers: Sit to/from Stand Sit to Stand: Mod assist         General transfer comment: pt requires (A) to elevate from surface  and cues ot place R UE first    Balance Overall balance assessment: Needs assistance Sitting-balance support: No upper extremity  supported;Feet supported Sitting balance-Leahy Scale: Good     Standing balance support: Single extremity supported Standing balance-Leahy Scale: Poor                             ADL either performed or assessed with clinical judgement   ADL Overall ADL's : Needs assistance/impaired Eating/Feeding: Minimal assistance   Grooming: Minimal assistance;Standing Grooming Details (indicate cue type and reason): cues for safety and positioning in RW. pt needs cues for R UE placement             Lower Body Dressing: Moderate assistance Lower Body Dressing Details (indicate cue type and reason): don underwear and needs help to thread and to pull up R side of underwear Toilet Transfer: Moderate assistance;RW;BSC Toilet Transfer Details (indicate cue type and reason): pt requires (A) to navigate with RW and platform. pt running R side into the doorways and continues to push RW Toileting- Water quality scientist and Hygiene: Min guard;Sit to/from stand Toileting - Clothing Manipulation Details (indicate cue type and reason): pt lateral lean and sit <>Stand for peri care     Functional mobility during ADLs: Moderate assistance;Rolling walker General ADL Comments: pt requires (A) for safety with RW with platform pt with incr stability and good placement of R UE. pt needs (A) to navigate [place]     Praxis      Cognition  Arousal/Alertness: Awake/alert Behavior During Therapy: WFL for tasks assessed/performed Overall Cognitive Status: Impaired/Different from baseline Area of Impairment: Awareness;Memory                     Memory: Decreased short-term memory;Decreased recall of precautions   Safety/Judgement: Decreased awareness of safety;Decreased awareness of deficits Awareness: Emergent            Exercises Other Exercises Other Exercises: demonstrates digit flexion and extension in foam block   Shoulder Instructions        General Comments pt encouraged to elevate R UE    Pertinent Vitals/ Pain       Pain Assessment: Faces Faces Pain Scale: Hurts a little bit Pain Location: right arm Pain Descriptors / Indicators: Guarding Pain Intervention(s): Monitored during session;Premedicated before session;Repositioned  Home Living                                          Prior Functioning/Environment              Frequency  Min 3X/week        Progress Toward Goals  OT Goals(current goals can now be found in the care plan section)  Progress towards OT goals: Progressing toward goals  Acute Rehab OT Goals Patient Stated Goal: to only stay a week at rehab OT Goal Formulation: With patient/family Time For Goal Achievement: 04/18/18 Potential to Achieve Goals: Good ADL Goals Pt Will Perform Grooming: sitting;with set-up Pt Will Perform Upper Body Bathing: with set-up;sitting Pt Will Transfer to Toilet: with min assist;bedside commode;ambulating Additional ADL Goal #1: Pt will demonstrates R UE edema management with self ROM digits and positioning in the Carter splint foam supervision  Plan Discharge plan remains appropriate    Co-evaluation                 AM-PAC PT "6 Clicks" Daily Activity     Outcome Measure   Help from another person eating meals?: A Little Help from another person taking care of personal grooming?: A Little Help from another person toileting, which includes using toliet, bedpan, or urinal?: A Little Help from another person bathing (including washing, rinsing, drying)?: A Little Help from another person to put on and taking off regular upper body clothing?: A Little Help from another person to put on and taking off regular lower body clothing?: A Little 6 Click Score: 18    End of Session Equipment Utilized During Treatment: Rolling walker  OT Visit Diagnosis: Unsteadiness on feet (R26.81) Pain - Right/Left: Left Pain - part of body: Arm    Activity Tolerance Patient tolerated treatment well   Patient Left in chair;with call bell/phone within reach   Nurse Communication Mobility status;Precautions        Time: 4010-2725 OT Time Calculation (min): 41 min  Charges: OT General Charges $OT Visit: 1 Visit OT Treatments $Self Care/Home Management : 38-52 mins   Jeri Modena   OTR/L Pager: [phone] Office: [phone] .    Parke Poisson B 04/09/2018, 10:45 AM

## 2018-04-09 NOTE — Progress Notes (Signed)
Brief Note:  Subjective: Pt seen with family at bedside. She states she is doing okay, awaiting insurance decision for CIR.  ROS: Right upper extremity pain.  Denies CP, SOB, N/V/D.  Objective Constitutional: No distress . Vital signs reviewed. HENT: Facial ecchymosis  Eyes: EOMI. No discharge. Cardiovascular: RRR. No JVD. Respiratory: CTA Bilaterally. Normal effort. GI: BS +. Non-distended. Musc: RUE edema, RUE/RLE tenderness Neurology: Alert RUE (limited by brace), hand grip 2/5 LUE: 5/5 proximal to distal RLE: HF, EK 3-/5, ADF 4/5 LLE: 4+/5 proximal to distal   Assessment Plan:  1. Weakness  MRI neg, EEG negative  Cont therapies  Await insurance approval for CIR  2. Right radius/ulna fracture  Splint in place  Recs per Ortho  WB through elbow  3. HTN  Monitor and provide prns in accordance with increased physical exertion and pain  4. Hypokalemia   Continues ontinue to monitor and replete as necessary  5. Seizures  Cont meds

## 2018-04-09 NOTE — Anesthesia Postprocedure Evaluation (Signed)
Anesthesia Post Note  Patient: Kellie Harris  Procedure(s) Performed: OPEN REDUCTION INTERNAL FIXATION (ORIF) WRIST FRACTURE (Right Wrist)     Patient location during evaluation: PACU Anesthesia Type: MAC and Regional Level of consciousness: awake and alert Pain management: pain level controlled Vital Signs Assessment: post-procedure vital signs reviewed and stable Respiratory status: spontaneous breathing, nonlabored ventilation, respiratory function stable and patient connected to nasal cannula oxygen Cardiovascular status: stable and blood pressure returned to baseline Postop Assessment: no apparent nausea or vomiting Anesthetic complications: no    Last Vitals:  Vitals:   04/09/18 1635 04/09/18 1933  BP: 119/82 110/86  Pulse: 91 83  Resp: 18 18  Temp:  36.7 C  SpO2: 96% 96%    Last Pain:  Vitals:   04/09/18 1639  TempSrc:   PainSc: 6                  Shenice Dolder L Harshal Sirmon

## 2018-04-09 NOTE — Progress Notes (Signed)
Inpatient Rehabilitation-Admissions Coordinator   [facility] still waiting to hear from insurance regarding decision for CIR. Will follow up tomorrow.   Jhonnie Garner, OTR/L  Rehab Admissions Coordinator  [phone] 04/09/2018 4:42 PM

## 2018-04-09 NOTE — Progress Notes (Signed)
Physical Therapy Treatment Patient Details Name: Kellie Harris MRN: [id number] DOB: 1931/03/27 Today's Date: 04/09/2018    History of Present Illness 84 y.o. female admitted on 04/03/18 for acute aphasia and R sided weakness.  CT was negative, so tPA given. Suspected L hemisphere stroke.  Pt also fell sustaining a R wrist fx due to have ORIF on 04/08/18 and is NWB pre op and wbat elbow only post op.  Pt with significant PMH of HOH (wears hearing aids), osteopenia, R knee fx, HTN, DDD (lumbar), R TKA, IM nail L hip, and bil cataract surgery.    PT Comments    Patient seen for mobility progression. Pt requires mod A for functional transfers and gait training with tendency to run into objects on R side when navigating environment. Pt encouraged to maintain R UE in carter positioning foam for edema management. Continue to recommend CIR for further skilled PT services to maximize independence and safety with mobility.   Follow Up Recommendations  CIR     Equipment Recommendations  Rolling walker with 5" wheels;Other (comment)(R platform)    Recommendations for Other Services Rehab consult     Precautions / Restrictions Precautions Precautions: Fall Precaution Comments: BP restrictions no >180/>105 Restrictions Weight Bearing Restrictions: Yes RUE Weight Bearing: Weight bear through elbow only    Mobility  Bed Mobility Overal bed mobility: Needs Assistance Bed Mobility: Supine to Sit     Supine to sit: Min guard     General bed mobility comments: pt OOB in chair upon arrival  Transfers Overall transfer level: Needs assistance Equipment used: Rolling walker (2 wheeled);Right platform walker Transfers: Sit to/from Stand Sit to Stand: Mod assist         General transfer comment: cues for use of platform RW and hand placement; assistance to power up into standing and position R UE on platform  Ambulation/Gait Ambulation/Gait assistance: Mod assist Gait Distance (Feet): 85  Feet Assistive device: Right platform walker Gait Pattern/deviations: Step-through pattern;Decreased stride length;Drifts right/left;Narrow base of support(bilat LE internal rotation) Gait velocity: decreased   General Gait Details: assistance to guiade RW and for balance; pt with difficulty navigating environment and running into objects mainly on R side despite cues   Stairs             Wheelchair Mobility    Modified Rankin (Stroke Patients Only)       Balance Overall balance assessment: Needs assistance Sitting-balance support: No upper extremity supported;Feet supported Sitting balance-Leahy Scale: Good     Standing balance support: Single extremity supported Standing balance-Leahy Scale: Poor                              Cognition Arousal/Alertness: Awake/alert Behavior During Therapy: WFL for tasks assessed/performed Overall Cognitive Status: Impaired/Different from baseline Area of Impairment: Awareness;Memory                     Memory: Decreased short-term memory;Decreased recall of precautions   Safety/Judgement: Decreased awareness of safety;Decreased awareness of deficits Awareness: Emergent          Exercises Other Exercises Other Exercises: demonstrates digit flexion and extension in foam block    General Comments General comments (skin integrity, edema, etc.): pt encouraged to elevate R UE      Pertinent Vitals/Pain Pain Assessment: Faces Faces Pain Scale: Hurts a little bit Pain Location: right arm Pain Descriptors / Indicators: Guarding Pain Intervention(s): Monitored during session;Premedicated before  session;Repositioned    Home Living                      Prior Function            PT Goals (current goals can now be found in the care plan section) Acute Rehab PT Goals Patient Stated Goal: to only stay a week at rehab Progress towards PT goals: Progressing toward goals    Frequency    Min  4X/week      PT Plan Current plan remains appropriate    Co-evaluation              AM-PAC PT "6 Clicks" Daily Activity  Outcome Measure  Difficulty turning over in bed (including adjusting bedclothes, sheets and blankets)?: A Lot Difficulty moving from lying on back to sitting on the side of the bed? : A Lot Difficulty sitting down on and standing up from a chair with arms (e.g., wheelchair, bedside commode, etc,.)?: Unable Help needed moving to and from a bed to chair (including a wheelchair)?: A Little Help needed walking in hospital room?: A Lot Help needed climbing 3-5 steps with a railing? : A Lot 6 Click Score: 12    End of Session Equipment Utilized During Treatment: Gait belt Activity Tolerance: Patient tolerated treatment well Patient left: in chair;with call bell/phone within reach Nurse Communication: Mobility status PT Visit Diagnosis: Muscle weakness (generalized) (M62.81);Difficulty in walking, not elsewhere classified (R26.2);Hemiplegia and hemiparesis Hemiplegia - Right/Left: Right Hemiplegia - dominant/non-dominant: Dominant Hemiplegia - caused by: Cerebral infarction     Time: 1045-1105 PT Time Calculation (min) (ACUTE ONLY): 20 min  Charges:  $Gait Training: 8-22 mins                     Earney Navy, PTA Pager: [phone]     Darliss Cheney 04/09/2018, 11:15 AM

## 2018-04-09 NOTE — [place] FL2 (Signed)
[place] MEDICAID FL2 LEVEL OF CARE SCREENING TOOL     IDENTIFICATION  Patient Name: Kellie Harris Birthdate: 29-Oct-1930 Sex: female Admission Date (Current Location): 04/03/2018  [facility] and [place] Number:  Engineering geologist and Address:  [facility] [place] [address], [place], [place] [address]      Provider Number: [id number]  Attending Physician Name and Address:  Rosalin Hawking, MD  Relative Name and Phone Number:       Current Level of Care: Hospital Recommended Level of Care: [place] Prior Approval Number:    Date Approved/Denied:   PASRR Number: [id number]  Discharge Plan: SNF    Current Diagnoses: Patient Active Problem List   Diagnosis Date Noted  . Distal radius and ulna fracture, right   . History of CVA (cerebrovascular accident)   . Benign essential HTN   . Tachycardia   . Leukocytosis   . Seizures ([place])   . Stroke-like episode ([place]) s/p IV tPA 04/03/2018  . Syncope   . TIA (transient ischemic attack) 11/04/2017  . Malnutrition of moderate degree 08/26/2017  . Total knee replacement status 08/07/2017  . Closed left hip fracture ([place]) 02/12/2017  . HTN (hypertension) 02/12/2017  . HLD (hyperlipidemia) 02/12/2017    Orientation RESPIRATION BLADDER Height & Weight     Self, Time, Situation, Place  Normal Continent Weight: 44.7 kg Height:  4\' 11"  (149.9 cm)  BEHAVIORAL SYMPTOMS/MOOD NEUROLOGICAL BOWEL NUTRITION STATUS      Continent Diet(regular)  AMBULATORY STATUS COMMUNICATION OF NEEDS Skin   Limited Assist Verbally Surgical wounds, Bruising                       Personal Care Assistance Level of Assistance  Bathing, Feeding, Dressing Bathing Assistance: Limited assistance Feeding assistance: Independent Dressing Assistance: Limited assistance     Functional Limitations Info  Sight, Hearing, Speech Sight Info: Impaired Hearing Info: Impaired Speech Info: Adequate    SPECIAL CARE FACTORS  FREQUENCY  PT (By licensed PT), OT (By licensed OT), Speech therapy     PT Frequency: 5X/wk OT Frequency: 5x/wk     Speech Therapy Frequency: 5x/wk      Contractures Contractures Info: Not present    Additional Factors Info  Code Status, Allergies, Psychotropic Code Status Info: full Allergies Info: NKA Psychotropic Info: Neurontin 100mg  TID         Current Medications (04/09/2018):  This is the current hospital active medication list Current Facility-Administered Medications  Medication Dose Route Frequency Provider Last Rate Last Dose  . acetaminophen (TYLENOL) tablet 650 mg  650 mg Oral Q4H PRN Ainsley Spinner, PA-C   650 mg at 04/07/18 2026  . amLODipine (NORVASC) tablet 5 mg  5 mg Oral Daily Ainsley Spinner, PA-C   5 mg at 04/08/18 1951  . clopidogrel (PLAVIX) tablet 75 mg  75 mg Oral Daily Ainsley Spinner, PA-C   75 mg at 04/08/18 1951  . gabapentin (NEURONTIN) capsule 100 mg  100 mg Oral TID Ainsley Spinner, PA-C   100 mg at 04/09/18 1639  . HYDROcodone-acetaminophen (NORCO/VICODIN) 5-325 MG per tablet 1-2 tablet  1-2 tablet Oral Q6H PRN Ainsley Spinner, PA-C   1 tablet at 04/09/18 1639  . lactated ringers infusion   Intravenous Continuous Ainsley Spinner, PA-C 10 mL/hr at 04/08/18 0740    . levETIRAcetam (KEPPRA XR) 24 hr tablet 500 mg  500 mg Oral Daily Ainsley Spinner, PA-C   500 mg at 04/09/18 1044  .  lisinopril (PRINIVIL,ZESTRIL) tablet 20 mg  20 mg Oral BID Ainsley Spinner, PA-C   20 mg at 04/09/18 1044  . MEDLINE mouth rinse  15 mL Mouth Rinse BID Ainsley Spinner, PA-C   15 mL at 04/08/18 2208  . morphine 2 MG/ML injection 1 mg  1 mg Intravenous Q3H PRN Ainsley Spinner, PA-C   1 mg at 04/08/18 2209  . pantoprazole (PROTONIX) EC tablet 40 mg  40 mg Oral Daily Ainsley Spinner, PA-C   40 mg at 04/09/18 1044  . pravastatin (PRAVACHOL) tablet 20 mg  20 mg Oral QHS Ainsley Spinner, PA-C   20 mg at 04/08/18 2207  . senna-docusate (Senokot-S) tablet 1 tablet  1 tablet Oral QHS PRN Ainsley Spinner, PA-C   1 tablet at 04/06/18  1607     Discharge Medications: Please see discharge summary for a list of discharge medications.  Relevant Imaging Results:  Relevant Lab Results:   Additional Information SS#: [id number]  Pollie Friar, RN

## 2018-04-09 NOTE — Discharge Instructions (Signed)
Orthopaedic Discharge instructions   Nonweightbearing through Right wrist Ok to weightbear through R elbow to use a platform walker No lifting with right arm  Finger and elbow motion as tolerated Move fingers as much as possible to limit swelling Do not remove splint, do not get splint wet Call office at [phone] with questions

## 2018-04-10 DIAGNOSIS — Z967 Presence of other bone and tendon implants: Secondary | ICD-10-CM | POA: Diagnosis not present

## 2018-04-10 DIAGNOSIS — E569 Vitamin deficiency, unspecified: Secondary | ICD-10-CM | POA: Diagnosis not present

## 2018-04-10 DIAGNOSIS — D649 Anemia, unspecified: Secondary | ICD-10-CM | POA: Diagnosis not present

## 2018-04-10 DIAGNOSIS — I639 Cerebral infarction, unspecified: Secondary | ICD-10-CM | POA: Diagnosis not present

## 2018-04-10 DIAGNOSIS — M6281 Muscle weakness (generalized): Secondary | ICD-10-CM | POA: Diagnosis not present

## 2018-04-10 DIAGNOSIS — I10 Essential (primary) hypertension: Secondary | ICD-10-CM | POA: Diagnosis not present

## 2018-04-10 DIAGNOSIS — Z419 Encounter for procedure for purposes other than remedying health state, unspecified: Secondary | ICD-10-CM | POA: Diagnosis not present

## 2018-04-10 DIAGNOSIS — Z9889 Other specified postprocedural states: Secondary | ICD-10-CM

## 2018-04-10 DIAGNOSIS — T148XXA Other injury of unspecified body region, initial encounter: Secondary | ICD-10-CM | POA: Diagnosis not present

## 2018-04-10 DIAGNOSIS — R197 Diarrhea, unspecified: Secondary | ICD-10-CM | POA: Diagnosis not present

## 2018-04-10 DIAGNOSIS — E785 Hyperlipidemia, unspecified: Secondary | ICD-10-CM | POA: Diagnosis not present

## 2018-04-10 DIAGNOSIS — Z8781 Personal history of (healed) traumatic fracture: Secondary | ICD-10-CM

## 2018-04-10 DIAGNOSIS — D72829 Elevated white blood cell count, unspecified: Secondary | ICD-10-CM | POA: Diagnosis not present

## 2018-04-10 DIAGNOSIS — R52 Pain, unspecified: Secondary | ICD-10-CM | POA: Diagnosis not present

## 2018-04-10 DIAGNOSIS — S72143D Displaced intertrochanteric fracture of unspecified femur, subsequent encounter for closed fracture with routine healing: Secondary | ICD-10-CM | POA: Diagnosis not present

## 2018-04-10 DIAGNOSIS — Z4789 Encounter for other orthopedic aftercare: Secondary | ICD-10-CM | POA: Diagnosis not present

## 2018-04-10 DIAGNOSIS — I2510 Atherosclerotic heart disease of native coronary artery without angina pectoris: Secondary | ICD-10-CM | POA: Diagnosis not present

## 2018-04-10 DIAGNOSIS — K588 Other irritable bowel syndrome: Secondary | ICD-10-CM | POA: Diagnosis not present

## 2018-04-10 DIAGNOSIS — E876 Hypokalemia: Secondary | ICD-10-CM | POA: Diagnosis not present

## 2018-04-10 DIAGNOSIS — Z8673 Personal history of transient ischemic attack (TIA), and cerebral infarction without residual deficits: Secondary | ICD-10-CM | POA: Diagnosis not present

## 2018-04-10 DIAGNOSIS — S52501D Unspecified fracture of the lower end of right radius, subsequent encounter for closed fracture with routine healing: Secondary | ICD-10-CM | POA: Diagnosis not present

## 2018-04-10 DIAGNOSIS — S6291XA Unspecified fracture of right wrist and hand, initial encounter for closed fracture: Secondary | ICD-10-CM | POA: Diagnosis not present

## 2018-04-10 DIAGNOSIS — T148XXS Other injury of unspecified body region, sequela: Secondary | ICD-10-CM | POA: Diagnosis not present

## 2018-04-10 DIAGNOSIS — R569 Unspecified convulsions: Secondary | ICD-10-CM | POA: Diagnosis not present

## 2018-04-10 MED ORDER — HYDROCODONE-ACETAMINOPHEN 5-325 MG PO TABS: 1.0000 | ORAL_TABLET | Freq: Four times a day (QID) | ORAL | 0 refills | Status: DC | PRN

## 2018-04-10 MED ORDER — GABAPENTIN 100 MG PO CAPS: 100.0000 mg | ORAL_CAPSULE | Freq: Three times a day (TID) | ORAL | 3 refills | Status: DC

## 2018-04-10 NOTE — Clinical Social Work Placement (Signed)
Nurse to call report to [phone], Room [phone]     CLINICAL SOCIAL WORK PLACEMENT  NOTE  Date:  04/10/2018  Patient Details  Name: Kellie Harris MRN: [id number] Date of Birth: Dec 10, 1930  Clinical Social Work is seeking post-discharge placement for this patient at the [place] level of care (*CSW will initial, date and re-position this form in  chart as items are completed):  Yes   Patient/family provided with [facility] Work Department's list of facilities offering this level of care within the geographic area requested by the patient (or if unable, by the patient's family).  Yes   Patient/family informed of their freedom to choose among providers that offer the needed level of care, that participate in Medicare, Medicaid or managed care program needed by the patient, have an available bed and are willing to accept the patient.  Yes   Patient/family informed of [facility]'s ownership interest in [facility] and [facility], as well as of the fact that they are under no obligation to receive care at these facilities.  PASRR submitted to EDS on 04/09/18     PASRR number received on       Existing PASRR number confirmed on 04/09/18     FL2 transmitted to all facilities in geographic area requested by pt/family on 04/09/18     FL2 transmitted to all facilities within larger geographic area on       Patient informed that his/her managed care company has contracts with or will negotiate with certain facilities, including the following:        Yes   Patient/family informed of bed offers received.  Patient chooses bed at [facility]     Physician recommends and patient chooses bed at      Patient to be transferred to [facility] on 04/10/18.  Patient to be transferred to facility by [facility]AR     Patient family notified on 04/10/18 of transfer.  Name of family member notified:  [name]     PHYSICIAN       Additional  Comment:    _______________________________________________ Geralynn Ochs, LCSW 04/10/2018, 4:21 PM

## 2018-04-10 NOTE — Progress Notes (Signed)
Inpatient Rehabilitation-Admissions Coordinator   Insurance has denied request for CIR. Pt is agreeable to SNF at this time. AC will sign off.   Jhonnie Garner, OTR/L  Rehab Admissions Coordinator  [phone] 04/10/2018 11:59 AM

## 2018-04-10 NOTE — Progress Notes (Signed)
NURSING PROGRESS NOTE  Kellie Harris [id number] Discharge Data: 04/10/2018 5:51 PM Attending Provider: Rosalin Hawking, MD MNO:[id number], Lucianne Muss, MD     [name] to be D/C'd Skilled nursing facility ( resources )[facility] per MD order.  Discussed with the patient the After Visit Summary and all questions fully answered. All IV's discontinued with no bleeding noted. All belongings returned to patient for patient to take home.   Last Vital Signs:  Blood pressure 112/78, pulse 87, temperature 97.7 F (36.5 C), temperature source Oral, resp. rate 16, height 4\' 11"  (1.499 m), weight 44.7 kg, SpO2 93 %.  Discharge Medication List Allergies as of 04/10/2018   No Known Allergies     Medication List    TAKE these medications   acetaminophen 500 MG tablet Commonly known as:  TYLENOL Take 500-1,000 mg by mouth every 6 (six) hours as needed (for pain or headaches).   amLODipine 5 MG tablet Commonly known as:  NORVASC Take 5 mg by mouth daily.   amoxicillin 500 MG capsule Commonly known as:  AMOXIL Take 2,000 mg by mouth See admin instructions. Take 2,000 mg by mouth one hour prior to dental procedures   clopidogrel 75 MG tablet Commonly known as:  PLAVIX Take 75 mg by mouth daily.   gabapentin 100 MG capsule Commonly known as:  NEURONTIN Take 1 capsule (100 mg total) by mouth 3 (three) times daily.   HYDROcodone-acetaminophen 5-325 MG tablet Commonly known as:  NORCO/VICODIN Take 1-2 tablets by mouth every 6 (six) hours as needed for moderate pain or severe pain.   levETIRAcetam 500 MG 24 hr tablet Commonly known as:  KEPPRA XR Take 1 tablet (500 mg total) by mouth daily.   lisinopril 20 MG tablet Commonly known as:  PRINIVIL,ZESTRIL Take 20 mg by mouth 2 (two) times daily.   pravastatin 20 MG tablet Commonly known as:  PRAVACHOL Take 1 tablet (20 mg total) by mouth at bedtime.   Probiotic Caps Take 1 capsule by mouth daily as needed (when IBS flares).

## 2018-04-10 NOTE — Clinical Social Work Note (Signed)
Clinical Social Work Assessment  Patient Details  Name: Kellie Harris MRN: [id number] Date of Birth: August 29, 1930  Date of referral:  04/10/18               Reason for consult:  Facility Placement                Permission sought to share information with:  Facility Sport and exercise psychologist, Family Supports Permission granted to share information::  Yes, Verbal Permission Granted  Name::     [place] administrator::  SNF  Relationship::  Daughter  Contact Information:     Housing/Transportation Living arrangements for the past 2 months:  [place] of Information:  Patient, Medical Team, Adult Children Patient Interpreter Needed:  None Criminal Activity/Legal Involvement Pertinent to Current Situation/Hospitalization:  No - Comment as needed Significant Relationships:  Adult Children Lives with:  Self Do you feel safe going back to the place where you live?  Yes Need for family participation in patient care:  No (Coment)  Care giving concerns:  Patient from home alone but will need short term rehab at discharge.   Social Worker assessment / plan:  CSW alerted yesterday by Rehab Admissions that if CIR is denied, patient and daughter would like [facility]. CSW sent referral based on their request, confirmed bed availability. CSW informed team. CSW initiated auth request through [facility]. CSW to follow waiting on insurance authorization.  Employment status:  Retired Nurse, adult PT Recommendations:  Inpatient [place] / Referral to community resources:  [place]  Patient/Family's Response to care:  Patient and daughter agreeable to SNF placement.  Patient/Family's Understanding of and Emotional Response to Diagnosis, Current Treatment, and Prognosis:  Patient and daughter aware of the patient's current deficits and need for rehabilitation for the patient. Patient and daughter disappointed that CIR is not  going to be an option but hopeful that the patient will improve with SNF and be able to go back home soon.  Emotional Assessment Appearance:  Appears stated age Attitude/Demeanor/Rapport:  Engaged Affect (typically observed):  Pleasant Orientation:  Oriented to Self, Oriented to Place, Oriented to  Time, Oriented to Situation Alcohol / Substance use:  Not Applicable Psych involvement (Current and /or in the community):  No (Comment)  Discharge Needs  Concerns to be addressed:  Care Coordination Readmission within the last 30 days:  No Current discharge risk:  Physical Impairment, Dependent with Mobility, Lives alone Barriers to Discharge:  Continued Medical Work up, [place], [place] 04/10/2018, 3:48 PM

## 2018-04-10 NOTE — Progress Notes (Signed)
Physical Therapy Treatment Patient Details Name: Kellie Harris MRN: [id number] DOB: 12-29-1930 Today's Date: 04/10/2018    History of Present Illness 84 y.o. female admitted on 04/03/18 for acute aphasia and R sided weakness.  CT was negative, so tPA given. Suspected L hemisphere stroke.  Pt also fell sustaining a R wrist fx due to have ORIF on 04/08/18 and is NWB pre op and wbat elbow only post op.  Pt with significant PMH of HOH (wears hearing aids), osteopenia, R knee fx, HTN, DDD (lumbar), R TKA, IM nail L hip, and bil cataract surgery.    PT Comments    Patient continues to make gradual progress toward PT goals. Pt requires min/mod A for OOB mobility and will continue to benefit from further skilled PT services in both acute and post acute settings.    Follow Up Recommendations  CIR     Equipment Recommendations  Rolling walker with 5" wheels;Other (comment)(R platform)    Recommendations for Other Services Rehab consult     Precautions / Restrictions Precautions Precautions: Fall Precaution Comments: BP restrictions no >180/>105 Restrictions Weight Bearing Restrictions: Yes RUE Weight Bearing: Weight bear through elbow only    Mobility  Bed Mobility               General bed mobility comments: pt OOB in chair upon arrival  Transfers Overall transfer level: Needs assistance Equipment used: Rolling walker (2 wheeled);Right platform walker Transfers: Sit to/from Stand Sit to Stand: Min assist         General transfer comment: cues for safe hand placement  Ambulation/Gait Ambulation/Gait assistance: Mod assist;Min assist   Assistive device: Right platform walker Gait Pattern/deviations: Step-through pattern;Decreased stride length;Drifts right/left;Narrow base of support(bilat LE internal rotation) Gait velocity: decreased   General Gait Details: assistance for balance and safe use of AD; directional cues and cues for posture and safety   Stairs              Wheelchair Mobility    Modified Rankin (Stroke Patients Only)       Balance Overall balance assessment: Needs assistance Sitting-balance support: No upper extremity supported;Feet supported Sitting balance-Leahy Scale: Good     Standing balance support: Single extremity supported Standing balance-Leahy Scale: Poor                              Cognition Arousal/Alertness: Awake/alert Behavior During Therapy: WFL for tasks assessed/performed Overall Cognitive Status: Impaired/Different from baseline Area of Impairment: Awareness;Memory                     Memory: Decreased short-term memory;Decreased recall of precautions   Safety/Judgement: Decreased awareness of safety;Decreased awareness of deficits Awareness: Emergent          Exercises      General Comments General comments (skin integrity, edema, etc.): R UE edema      Pertinent Vitals/Pain Pain Assessment: Faces Faces Pain Scale: Hurts a little bit Pain Location: right arm Pain Descriptors / Indicators: Guarding;Sore Pain Intervention(s): Monitored during session;Premedicated before session;Repositioned;Ice applied    Home Living                      Prior Function            PT Goals (current goals can now be found in the care plan section) Progress towards PT goals: Progressing toward goals    Frequency  Min 4X/week      PT Plan Current plan remains appropriate    Co-evaluation              AM-PAC PT "6 Clicks" Daily Activity  Outcome Measure  Difficulty turning over in bed (including adjusting bedclothes, sheets and blankets)?: A Lot Difficulty moving from lying on back to sitting on the side of the bed? : A Lot Difficulty sitting down on and standing up from a chair with arms (e.g., wheelchair, bedside commode, etc,.)?: Unable Help needed moving to and from a bed to chair (including a wheelchair)?: A Little Help needed walking in hospital  room?: A Lot Help needed climbing 3-5 steps with a railing? : A Lot 6 Click Score: 12    End of Session Equipment Utilized During Treatment: Gait belt Activity Tolerance: Patient tolerated treatment well Patient left: in chair;with call bell/phone within reach Nurse Communication: Mobility status PT Visit Diagnosis: Muscle weakness (generalized) (M62.81);Difficulty in walking, not elsewhere classified (R26.2);Hemiplegia and hemiparesis Hemiplegia - Right/Left: Right Hemiplegia - dominant/non-dominant: Dominant Hemiplegia - caused by: Cerebral infarction     Time: 2706-2376 PT Time Calculation (min) (ACUTE ONLY): 21 min  Charges:  $Gait Training: 8-22 mins                     Earney Navy, PTA Pager: [phone]     Darliss Cheney 04/10/2018, 2:07 PM

## 2018-04-14 DIAGNOSIS — S6291XA Unspecified fracture of right wrist and hand, initial encounter for closed fracture: Secondary | ICD-10-CM | POA: Diagnosis not present

## 2018-04-14 DIAGNOSIS — R569 Unspecified convulsions: Secondary | ICD-10-CM | POA: Diagnosis not present

## 2018-04-14 DIAGNOSIS — I10 Essential (primary) hypertension: Secondary | ICD-10-CM | POA: Diagnosis not present

## 2018-04-14 DIAGNOSIS — E785 Hyperlipidemia, unspecified: Secondary | ICD-10-CM | POA: Diagnosis not present

## 2018-04-21 DIAGNOSIS — R569 Unspecified convulsions: Secondary | ICD-10-CM | POA: Diagnosis not present

## 2018-04-21 DIAGNOSIS — R197 Diarrhea, unspecified: Secondary | ICD-10-CM | POA: Diagnosis not present

## 2018-04-21 DIAGNOSIS — D649 Anemia, unspecified: Secondary | ICD-10-CM | POA: Diagnosis not present

## 2018-04-21 DIAGNOSIS — I10 Essential (primary) hypertension: Secondary | ICD-10-CM | POA: Diagnosis not present

## 2018-04-21 DIAGNOSIS — E785 Hyperlipidemia, unspecified: Secondary | ICD-10-CM | POA: Diagnosis not present

## 2018-04-22 ENCOUNTER — Other Ambulatory Visit: Payer: Self-pay | Admitting: *Deleted

## 2018-04-22 NOTE — Patient Outreach (Signed)
[facility]) Care Management  04/22/2018  Kellie Harris [birth year] [id number]    Attended discharge planning meeting at [facility]. Social worker Hilliard Clark present, stating patient has been a frequent patient. Stating patient has a MD appointment tomorrow. No discharge date set. Patient is progressing with therapy but continues to have an unsteady gate. Patient refusing ALF and wants to make a plan to discharge to home.   Met with daughter Kellie Harris and patient at the bedside.  Patient noted to have a cast to right wrist and to be sitting in a wheelchair. Patient stated daughter has been helping out but she has two small kids and lives in [place].  [place] Management services and patient and daughter agree to services.  Patient concerned about returning home with injured Rvwrist related to she is right handed. Patient states she feels she needs some help for a short time.  Patient stated she would like to speak to the [facility] LCSW about community resources to assist with in home care giving.  Patient gave permission to speak with daughter.  Referral sent for [facility] nurse to engage patient for transition of care and [facility] LCSW to each out to patient while at SNF to discuss community resources around home care givers.  Plan to update [facility] team member Marlowe Kays patient accepted services.  Rutherford Limerick RN, BSN [place] Acute Care Coordinator [phone]) Business Mobile [phone]) Toll free office

## 2018-04-23 DIAGNOSIS — S52501D Unspecified fracture of the lower end of right radius, subsequent encounter for closed fracture with routine healing: Secondary | ICD-10-CM | POA: Diagnosis not present

## 2018-04-26 DIAGNOSIS — I69351 Hemiplegia and hemiparesis following cerebral infarction affecting right dominant side: Secondary | ICD-10-CM | POA: Diagnosis not present

## 2018-04-26 DIAGNOSIS — S52501D Unspecified fracture of the lower end of right radius, subsequent encounter for closed fracture with routine healing: Secondary | ICD-10-CM | POA: Diagnosis not present

## 2018-04-26 DIAGNOSIS — E785 Hyperlipidemia, unspecified: Secondary | ICD-10-CM | POA: Diagnosis not present

## 2018-04-26 DIAGNOSIS — Z79891 Long term (current) use of opiate analgesic: Secondary | ICD-10-CM | POA: Diagnosis not present

## 2018-04-26 DIAGNOSIS — S52601D Unspecified fracture of lower end of right ulna, subsequent encounter for closed fracture with routine healing: Secondary | ICD-10-CM | POA: Diagnosis not present

## 2018-04-26 DIAGNOSIS — I69320 Aphasia following cerebral infarction: Secondary | ICD-10-CM | POA: Diagnosis not present

## 2018-04-26 DIAGNOSIS — H9193 Unspecified hearing loss, bilateral: Secondary | ICD-10-CM | POA: Diagnosis not present

## 2018-04-26 DIAGNOSIS — Z7902 Long term (current) use of antithrombotics/antiplatelets: Secondary | ICD-10-CM | POA: Diagnosis not present

## 2018-04-26 DIAGNOSIS — E440 Moderate protein-calorie malnutrition: Secondary | ICD-10-CM | POA: Diagnosis not present

## 2018-04-26 DIAGNOSIS — R569 Unspecified convulsions: Secondary | ICD-10-CM | POA: Diagnosis not present

## 2018-04-26 DIAGNOSIS — M5136 Other intervertebral disc degeneration, lumbar region: Secondary | ICD-10-CM | POA: Diagnosis not present

## 2018-04-26 DIAGNOSIS — Z96651 Presence of right artificial knee joint: Secondary | ICD-10-CM | POA: Diagnosis not present

## 2018-04-26 DIAGNOSIS — I10 Essential (primary) hypertension: Secondary | ICD-10-CM | POA: Diagnosis not present

## 2018-04-28 ENCOUNTER — Other Ambulatory Visit: Payer: Self-pay | Admitting: *Deleted

## 2018-04-28 ENCOUNTER — Ambulatory Visit: Payer: Self-pay | Admitting: *Deleted

## 2018-04-28 NOTE — Patient Outreach (Signed)
[facility]) Care Management  04/28/2018  Kellie Harris 02/24/1931 [id number]  Transition of care call  Patient with recent Hospital admission at [facility], 8/22-8/29 with stroke like episode, fall, fracture of right radius/ulnar and ORIF [facility] stay 8/29 - 9/13.  PMHx: includes but not limited to HTN, HLD,IBS, TIA  , left hip fracture.   Successful outreach call to patient, HIPAA verified, explained reason for the call and [facility] care management services, patient aware of services and previous engagement with this care coordinator.   Patient discussed that she is doing fairly well, states she wishes that they could figure out why she continues to have these episodes. She discussed the day she had her event.  Patient discussed wrist surgery and has a cast in place. She discussed that she has had a follow up visit with surgeon and scheduled for another visit in 3 weeks with hopeful for removal of cast.    Patient discussed home health PT visited on Saturday report she has been approved for 3 therapy visit and they hope to spread it out over a couple of weeks.  Patient states OT to visit on tomorrow. Patient reports that she had a personal care assist through [place] she had initial visit on today and plans for 4 hours daily, other than that she is staying at home alone, reports she told her daughter not to visit on today.   Patient reports that she is using a cane at this time for mobility. Reports she has a walker but it is too big and they have ordered her a smaller one.  Patient reports that she is taking a sponge bath at present , and waiting on OT to help with taking shower due to limitations with right wrist.  Patient states that she has a shower bench but just hasn't taken a shower yet. Patient reports that she is wearing her medic alert button .   Patient discussed PCP visit on this week and neurology follow up in next month.  Patient is agreeable to [facility] care  management follow as part of transition of care.  Patient was recently discharged from hospital and all medications have been reviewed.Patient discussed that her Lisinopril was stopped while at [facility], and she has called her PCP to notify and ask if it needs to be restarted. Patient discussed her potassium level was low prior to discharge home and she has been started on potassium daily for 4 days  Patient a retired Therapist, sports monitors her blood pressure 141/87 on today.   Plan  Will plan initial home visit in the next week, patient request phone call prior to visit to make sure no other new  appointments scheduled.  Will send PCP involvement barrier letter,  Will send patient welcome letter.    THN CM Care Plan Problem One     Most Recent Value  Care Plan Problem One  Recent hospital admission related to stroke symptoms, right wrist surgery , at risk for readmission .   Role Documenting the Problem One  Care Management [place] for Problem One  Active  THN Long Term Goal   Patient will not experience a hospital admission within the next 31 days   THN Long Term Goal Start Date  04/28/18  Interventions for Problem One Long Term Goal  Discussed discharge instructions, advised regarding adhering to discharge instructions, medication list   [facility] CM Short Term Goal #1   Patient will attend medical appointment  in the next 30  days   THN CM Short Term Goal #1 Start Date  04/28/18  Interventions for Short Term Goal #1  Reviewed upcoming appointments, with PCP on this week, discussed transportation .   THN CM Short Term Goal #2   Patietn will be able to report increase in strength and balance over the next 30 days   THN CM Short Term Goal #2 Start Date  04/28/18  Interventions for Short Term Goal #2  Advised regarding using cane/walker at all times,       [name], RN, [place] Management Coordinator  [phone]- Mobile [phone]- [place]

## 2018-04-28 NOTE — Patient Outreach (Signed)
[facility]) Care Management  04/28/2018  Kellie Harris 11-09-1930 [id number]   Post Acute care coordination visit to see patient at [facility]. Per [name], discharge planner at [facility] patient discharged home with [facility] on 04/25/18. Patient was also assessed for private duty care with [facility]. It is not determined the amount of hours patient will receive private duty care.   Plan: This Education officer, museum will refer patient to [facility] for Transition of Care. This social worker will contact patient at home to assess for community resource needs.   [name] [facility] Care Management [phone]

## 2018-04-30 ENCOUNTER — Other Ambulatory Visit: Payer: Self-pay | Admitting: *Deleted

## 2018-04-30 NOTE — Patient Outreach (Signed)
[facility]) Care Management  04/30/2018  Kellie Harris 12/28/1930 [id number]    Phone call to patient to confirm that in home care has been arranged following her rehab stay at [facility].  Patient states that she continues to receive home health PT and has realized tht she is still weak. " I will keep working at it".   Patient confirms that she has an aid through [place] 4 hours a day. Patient states that this is working out well as they continue to get to know one another. Patient discussed having all of her care needs met at this time verbalizing having no community resource needs at this time. This Education officer, museum will sign off, however patient provided with this social worker's contact information if there are any future needs or concerns.  [name] [facility] Care Management [phone]

## 2018-05-01 NOTE — Op Note (Signed)
NAME: Georgi[place], [place] RECORD PF:[id number] ACCOUNT [id number] DATE OF BIRTH:[birth year] FACILITY: [facility] LOCATION: [facility] PHYSICIAN:Avrie Kedzierski H. Helaine Yackel, MD  OPERATIVE REPORT  DATE OF PROCEDURE:  04/08/2018  PREOPERATIVE DIAGNOSES:  Severely comminuted right distal radius and ulna fractures with displacement.  POSTOPERATIVE DIAGNOSES:  Severely comminuted right distal radius and ulna fractures with displacement.  PROCEDURE:  Open reduction internal fixation of right distal radius.  SURGEON:  Altamese [place], MD  ASSISTANT:  Ainsley Spinner, PA-C.  ANESTHESIA:  General.  COMPLICATIONS:  None.  TOURNIQUET:  None.  DISPOSITION:  To PACU.  CONDITION:  Stable.  BRIEF SUMMARY AND INDICATIONS FOR PROCEDURE:  The patient is a very pleasant 84 year old female who sustained a neurologic event resulting in a fall and a severely comminuted right distal radius fracture with intra-articular extension.  She was placed  into a splint initially with plans for definitive fixation as neurologic stability allowed.  At this time, she presents for definitive repair.  I discussed with her and her family the risks and benefits of surgery including the possibility of a stroke,  heart attack, infection, nerve injury, vessel injury, infection, DVT, PE, failure to regain motion, arthritis and multiple others and she strongly wished to proceed.  This was her dominant arm.  SUMMARY OF PROCEDURE:  The patient was taken to the operating room where general anesthesia was induced.  Her right upper extremity was prepped and draped in the usual sterile fashion.  No tourniquet was used during the procedure.  After a timeout,  standard volar approach was made to the distal radius.  The radial artery was retracted radially and the deep aspect of the flexor carpi ulnaris, flexor carpi radialis tendon sheath was incised.  The pronator was swept ulnarly and a Bennett retractor  carefully placed there.  It was quite difficult to  restore full articular congruity of each of the segments and an assistant was necessary to help with obtaining and maintaining a reduction by pulling traction.  I did place 2 towels underneath the carpus  to help restore tilt.  I did try to obtain as much lunate fossa support as possible and placed a Hohmann along the radial aspect and the ulnar aspects of the distal radius to compress the articular fragments across the articular surface.  I then placed  the distal row of pegs with the plate off the metaphysis and as I brought to the metaphysis down to the bone, this restored appropriate tilt, inclination and height.  The ulna reduced quite nicely and as a result did not require additional fixation.  The  patient's bone quality was as anticipated for an 84 year old female.  Wound was irrigated thoroughly and then the wound closed in standard layered fashion using 0 Vicryl, 2-0 Vicryl and 3-0 nylon for the skin.  Sterile gently compressive dressing was  applied and then a volar splint.  The patient was awakened from the anesthesia and transported to PACU in stable condition.  Assistant again was necessary for safe and expedient completion of the case.  PROGNOSIS:  The patient will be in a sling with elevation and restricted range of motion of the digits and elbow.  She will remain under the care of the urologic service, which of course will have implications regarding antiplatelet therapy.  TN/NUANCE  D:04/30/2018 T:05/01/2018 JOB:[id number]

## 2018-05-02 DIAGNOSIS — E876 Hypokalemia: Secondary | ICD-10-CM | POA: Diagnosis not present

## 2018-05-02 DIAGNOSIS — S62101D Fracture of unspecified carpal bone, right wrist, subsequent encounter for fracture with routine healing: Secondary | ICD-10-CM | POA: Diagnosis not present

## 2018-05-02 DIAGNOSIS — Z87898 Personal history of other specified conditions: Secondary | ICD-10-CM | POA: Diagnosis not present

## 2018-05-07 ENCOUNTER — Other Ambulatory Visit: Payer: Self-pay | Admitting: *Deleted

## 2018-05-07 ENCOUNTER — Ambulatory Visit: Payer: Self-pay | Admitting: *Deleted

## 2018-05-07 ENCOUNTER — Encounter: Payer: Self-pay | Admitting: *Deleted

## 2018-05-07 DIAGNOSIS — S52601D Unspecified fracture of lower end of right ulna, subsequent encounter for closed fracture with routine healing: Secondary | ICD-10-CM | POA: Diagnosis not present

## 2018-05-07 DIAGNOSIS — E440 Moderate protein-calorie malnutrition: Secondary | ICD-10-CM | POA: Diagnosis not present

## 2018-05-07 DIAGNOSIS — S52501D Unspecified fracture of the lower end of right radius, subsequent encounter for closed fracture with routine healing: Secondary | ICD-10-CM | POA: Diagnosis not present

## 2018-05-07 DIAGNOSIS — I10 Essential (primary) hypertension: Secondary | ICD-10-CM | POA: Diagnosis not present

## 2018-05-07 DIAGNOSIS — I69320 Aphasia following cerebral infarction: Secondary | ICD-10-CM | POA: Diagnosis not present

## 2018-05-07 DIAGNOSIS — M5136 Other intervertebral disc degeneration, lumbar region: Secondary | ICD-10-CM | POA: Diagnosis not present

## 2018-05-07 DIAGNOSIS — R569 Unspecified convulsions: Secondary | ICD-10-CM | POA: Diagnosis not present

## 2018-05-07 DIAGNOSIS — H9193 Unspecified hearing loss, bilateral: Secondary | ICD-10-CM | POA: Diagnosis not present

## 2018-05-07 NOTE — Patient Outreach (Signed)
[facility]) Care Management  05/07/2018  Taylia Berber [place] Sep 02, 1930 [id number]   Transition of care call Patient with recent Hospital admission at [facility], 8/22-8/29 with stroke like episode, fall, fracture of right radius/ulnar and ORIF [facility] stay 8/29 - 9/13.  PMHx: includes but not limited to HTN, HLD,IBS, TIA  , left hip fracture  Successful outreach call to patient to arrival for visit, she states that she forgot about the visit and is just getting back up . Patient discussed she didn't feel good on this morning so she laid back down.   Discussed with patient how she feels reports generally feeling  weak, denies any dizziness episodes or other stroke symptoms reviewed.  Patient reports that she has been bothered by her irritable bowel problems reports having loose stool, it does not last all day maybe just one a day. She discussed forgetting to ask PCP at her visit on last week for referral to GI doctor, offered assistance with call states she will do it. She discussed everything checked out at MD visit on last week and he stopped her lisinopril .  Patient denies nausea or vomiting, reports her blood pressure was 112/71 and she monitors it daily.  Patient discussed she has physical therapy visit at 1:30 also on today.  Patient discussed visit with OT on yesterday and she was able to take a shower and that felt good, patient is hopeful that OT will be able to schedule one more visit , reports request has been sent to insurance.   Patient reports that her personal care assistant Joannie [place] is with her now . Patient discussed appreciating having the help because she is not able to do some of her usual chores such as making her bed. Patient still has cast to right wrist and has follow up appointment with ortho in 2 weeks her daughter has that appointment date.   Patient request to reschedule home visit for today to another day.   Plan Will continue with weekly transition  of care calls, Discussed with patient coworker will follow up with her in the next week.  Will plan return call the patient that following week and schedule home visit.   THN CM Care Plan Problem One     Most Recent Value  Care Plan Problem One  Recent hospital admission related to stroke symptoms, right wrist surgery , at risk for readmission .   Role Documenting the Problem One  Care Management [place] for Problem One  Active  THN Long Term Goal   Patient will not experience a hospital admission within the next 31 days   THN Long Term Goal Start Date  04/28/18  Interventions for Problem One Long Term Goal  Reviewed current clinical state, reviewed worsening GI symptoms diarrhea, vomiting , food intolerance , weakness and  sign of stroke/TIA     THN CM Short Term Goal #1   Patient will attend medical appointment in the next 30  days   THN CM Short Term Goal #1 Start Date  04/28/18  Interventions for Short Term Goal #1  Discussed upcoming medical appointments with ortho and neurology, discussed transportation , will be provided by daugher or Personal caregiver .   THN CM Short Term Goal #2   Patietn will be able to report increase in strength and balance over the next 30 days   THN CM Short Term Goal #2 Start Date  04/28/18  Interventions for Short Term Goal #2  encouraged to continue participation with home health therapy and exercise instructed on , reviewed fall precautions .       [name], RN, [place] Management Coordinator  [phone]- Mobile [phone]- Toll Free Main Office

## 2018-05-14 DIAGNOSIS — S52501D Unspecified fracture of the lower end of right radius, subsequent encounter for closed fracture with routine healing: Secondary | ICD-10-CM | POA: Diagnosis not present

## 2018-05-16 ENCOUNTER — Other Ambulatory Visit: Payer: Self-pay

## 2018-05-16 NOTE — Patient Outreach (Signed)
Transition of care call:  Placed call to patient who answered and reports that she is still weak. Reports that she believes she is slightly better. Reports that she remains active with home health PT and OT is finished. Reports that she just got out of the shower and is be assisted by her caregiver. Reports she got her cast off 2 days ago and is now in a brace with no weight bearing.  Patient reports that she does not eat a lot but is trying to eat frequent small meals.  Denies any new problems or concerns today.  PLAN: will update Landis Martins RN and reminded patient home visit would be rescheduled.  Encouraged patient to be safe at home.  Tomasa Rand, RN, BSN, CEN [facility] [employer] [phone]

## 2018-05-20 ENCOUNTER — Other Ambulatory Visit: Payer: Self-pay | Admitting: *Deleted

## 2018-05-20 NOTE — Patient Outreach (Signed)
[facility]) Care Management  05/20/2018  Kellie Harris Jul 05, 1931 [id number]   Transition of care call  Patient with recent Hospital admission at [facility], 8/22-8/29 with stroke like episode, fall, fracture of right radius/ulnar and OR[facility] stay 8/29 - 9/13.  PMHx: includes but not limited to HTN, HLD,IBS, TIA , left hip fracture   Successful outreach call to patient, reports that she continues to feel weak, having to rest after she does any activity. Denies having any syncopal type episodes  Patient discussed having a physical therapy visit on today, which she states will be her last home visit after getting extension improved by insurance  and the therapist remarked that she has made some good improvement .  Patient discussed that she has wrist  cast off now and will begin outpatient therapy on her wrist on Monday.  Patient discussed she continues to have a decreased appetites but is trying to eat a little more in small amounts throughout the day.   Patient discussed having neurology referral  appointment on 10/10, she plans to discuss asking for outpatient physical therapy orders, she feels therapy on lower extremities if what she really needs.   Patient still has her personal care provider 5 days a week to assist her ,patient reports that she continues to tolerate taking shower with stand by assistance.   Patient agreeable to home visit in the next week.   Plan  Will schedule home visit in the next week.  Reinforced fall precautions , using walker/cane as needed.    [name], RN, [place] Management Coordinator  [phone]- Mobile [phone]- Toll Free Main Office

## 2018-05-22 ENCOUNTER — Encounter: Payer: Self-pay | Admitting: Neurology

## 2018-05-22 ENCOUNTER — Ambulatory Visit (INDEPENDENT_AMBULATORY_CARE_PROVIDER_SITE_OTHER): Payer: PPO | Admitting: Neurology

## 2018-05-22 VITALS — BP 162/98 | HR 83 | Ht <= 58 in | Wt 96.0 lb

## 2018-05-22 DIAGNOSIS — I639 Cerebral infarction, unspecified: Secondary | ICD-10-CM | POA: Diagnosis not present

## 2018-05-22 DIAGNOSIS — R569 Unspecified convulsions: Secondary | ICD-10-CM

## 2018-05-22 DIAGNOSIS — R2990 Unspecified symptoms and signs involving the nervous system: Secondary | ICD-10-CM

## 2018-05-22 MED ORDER — LEVETIRACETAM ER 500 MG PO TB24: 500.0000 mg | ORAL_TABLET | Freq: Every day | ORAL | 3 refills | Status: DC

## 2018-05-22 NOTE — Progress Notes (Signed)
[facility] [address] [place]. [place] [address] [phone]       OFFICE FOLLOW-UP NOTE  Kellie. Kellie Harris Date of Birth:  [birth year] Medical Record Number:  [id number]   HPI: Kellie Harris is a pleasant 84 year old Caucasian lady seen today for initial office follow-up visit following hospital admission for stroke like episode.  History is obtained from the patient and her daughter who accompanies her for this visit as well as review of electronic medical records.  I have personally reviewed imaging films.  Patient had initial episode 11/04/2017 while she was at a [employer] store where she developed a blank look on her face and   collapsed and was briefly unresponsive and EMS were called.  Patient woke up and was confused and disoriented.  Upon seen by telemetry neurologist at [facility] she had no focal deficits but CT scan as well as MRI scan of the brain were obtained both of which did not show any acute abnormality.  Transthoracic echocardiogram and CT angiograms were unremarkable.  LDL cholesterol 87 mg percent and hemoglobin A1c was 4.7.  She was already on Plavix which was continued.  Patient had a second episode on 04/03/2018 when she complained of feeling dizzy while standing in her driveway she actually fell and fractured her right wrist.  When she woke up she was confused and had some trouble speaking and expressive language difficulties.  She was brought in as a code stroke and was still found to be confused and had trouble speaking and hence given TPA uneventfully.  MRI scan of the brain showed no acute infarct.  EEG was obtained which was normal.  Since this was the patient's second episode in both lower and weakness possibly of seizure was considered and she was started on Keppra Exar 500 mg daily.  Patient states she has done well since discharge.  She has had no further episodes of dizziness or passing out a seizure.  She is tolerating Keppra well without  any side effects.  She lives alone.  She does have a home health aide who comes for 4 hours every day.  She has not been driving.  She denies any prior history of seizures, strokes, TIAs, significant head injury with loss of consciousness.  She is also tolerating Plavix without bruising or bleeding.  Her blood pressure is well controlled and today it is elevated at 162/98.  She is tolerating Pravachol well without muscle aches and pains.  ROS:   14 system review of systems is positive for confusion, loss of consciousness, fall, injury, forearm pain, speech difficulty and all other systems negative PMH:  Past Medical History:  Diagnosis Date  . Arthritis    right knee, left shoulder/ OSTEO  . Cancer ([place])    skin   . Degenerative disc disease, lumbar    L4-L5  . Diverticulosis    ITIS  . History of hiatal hernia   . Hydronephrosis    right  . Hypercholesteremia   . Hypertension    CONTROLLED ON MEDS  . IBS (irritable bowel syndrome)   . Knee fracture, right 03/05/2015   DIFFICULTY WITH AMBULATION  . Osteopenia   . Ovarian cyst   . Pancreatic insufficiency   . Wears hearing aid    bilateral    Social History:  Social History   Socioeconomic History  . Marital status: Widowed    Spouse name: Not on file  . Number of children: Not on file  .  Years of education: Not on file  . Highest education level: Not on file  Occupational History  . Not on file  Social Needs  . Financial resource strain: Not hard at all  . Food insecurity:    Worry: Patient refused    Inability: Patient refused  . Transportation needs:    Medical: Patient refused    Non-medical: Patient refused  Tobacco Use  . Smoking status: Never Smoker  . Smokeless tobacco: Never Used  Substance and Sexual Activity  . Alcohol use: No  . Drug use: No  . Sexual activity: Not Currently  Lifestyle  . Physical activity:    Days per week: Patient refused    Minutes per session: Patient refused  . Stress: Not  at all  Relationships  . Social connections:    Talks on phone: Patient refused    Gets together: Patient refused    Attends religious service: Patient refused    Active member of club or organization: Patient refused    Attends meetings of clubs or organizations: Patient refused    Relationship status: Patient refused  . Intimate partner violence:    Fear of current or ex partner: Patient refused    Emotionally abused: Patient refused    Physically abused: Patient refused    Forced sexual activity: Patient refused  Other Topics Concern  . Not on file  Social History Narrative  . Not on file    Medications:   Current Outpatient Medications on File Prior to Visit  Medication Sig Dispense Refill  . acetaminophen (TYLENOL) 500 MG tablet Take 500-1,000 mg by mouth every 6 (six) hours as needed (for pain or headaches).    [name] Kitchen amLODipine (NORVASC) 5 MG tablet Take 5 mg by mouth daily.    [name] Kitchen amoxicillin (AMOXIL) 500 MG capsule Take 2,000 mg by mouth See admin instructions. Take 2,000 mg by mouth one hour prior to dental procedures    . clopidogrel (PLAVIX) 75 MG tablet Take 75 mg by mouth daily.    . pravastatin (PRAVACHOL) 20 MG tablet Take 1 tablet (20 mg total) by mouth at bedtime. 30 tablet 3  . Probiotic CAPS Take 1 capsule by mouth daily as needed (when IBS flares).     No current facility-administered medications on file prior to visit.     Allergies:  No Known Allergies  Physical Exam General: well developed, well nourished, seated, in no evident distress Head: head normocephalic and atraumatic.  Neck: supple with no carotid or supraclavicular bruits Cardiovascular: regular rate and rhythm, no murmurs Musculoskeletal: Mild kyphoscoliosis.  Wearing a right forearm splint for fracture  skin:  no rash/petichiae Vascular:  Normal pulses all extremities Vitals:   05/22/18 1310  BP: (!) 162/98  Pulse: 83   Neurologic Exam Mental Status: Awake and fully alert. Oriented to  place and time. Recent and remote memory intact. Attention span, concentration and fund of knowledge appropriate. Mood and affect appropriate.  Cranial Nerves: Fundoscopic exam reveals sharp disc margins. Pupils equal, briskly reactive to light. Extraocular movements full without nystagmus. Visual fields full to confrontation. Hearing intact. Facial sensation intact. Face, tongue, palate moves normally and symmetrically.  Motor: Normal bulk and tone. Normal strength in all tested extremity muscles. Sensory.: intact to touch ,pinprick .position and vibratory sensation.  Coordination: Rapid alternating movements normal in all extremities. Finger-to-nose and heel-to-shin performed accurately bilaterally. Gait and Station: Arises from chair without difficulty. Stance is normal. Gait demonstrates normal stride length and balance . Able to heel,  toe and tandem walk with moderate difficulty.  Reflexes: 1+ and symmetric. Toes downgoing.       ASSESSMENT: 23 year Caucasian lady with 2 stroke like episodes of brief loss of consciousness, confusion and expressive speech difficulties possible complex partial seizures rather than TIAs.  She has had negative brain imaging x2    PLAN: I had a long discussion with the patient and her daughter regarding her recent episode of fall with brief loss of consciousness followed by some speech difficulties and confusion being of unclear etiology.  Complex partial seizure is more likely than a TIA since she has had 2 exactly similar episodes in less than 6 months with negative brain imaging on both occasions.  I recommend she continue Keppra XR 500 mg daily for seizure prophylaxis.  She was given a refill for a year.  Referred to physical therapy for gait and balance training.  She will follow-up with her outpatient neurologist Dr. Leroy Sea in [place] or return here if she  Wishes in future as needed only Greater than 50% of time during this 25 minute visit was spent on  counseling,explanation of diagnosis of possible seizure, planning of further management, discussion with patient and family and coordination of care Antony Contras, MD  [facility] [address] [place], [place] [address]  Phone [phone] Fax [phone] Note: This document was prepared with digital dictation and possible smart phrase technology. Any transcriptional errors that result from this process are unintentional

## 2018-05-22 NOTE — Patient Instructions (Signed)
I had a long discussion with the patient and her daughter regarding her recent episode of fall with brief loss of consciousness followed by some speech difficulties and confusion being of unclear etiology.  Complex partial seizure is more likely than a TIA since she has had 2 exactly similar episodes in less than 6 months with negative brain imaging on both occasions.  I recommend she continue Keppra XR 500 mg daily for seizure prophylaxis.  She was given a refill for a year.  Referred to physical therapy for gait and balance training.  She will follow-up with her outpatient neurologist Dr. Leroy Sea in [place] or return here if she  Wishes in future as needed only

## 2018-05-26 ENCOUNTER — Other Ambulatory Visit: Payer: Self-pay

## 2018-05-26 ENCOUNTER — Encounter: Payer: Self-pay | Admitting: Occupational Therapy

## 2018-05-26 ENCOUNTER — Ambulatory Visit: Payer: PPO | Attending: Internal Medicine | Admitting: Occupational Therapy

## 2018-05-26 DIAGNOSIS — R262 Difficulty in walking, not elsewhere classified: Secondary | ICD-10-CM | POA: Diagnosis not present

## 2018-05-26 DIAGNOSIS — Z9181 History of falling: Secondary | ICD-10-CM | POA: Insufficient documentation

## 2018-05-26 DIAGNOSIS — M25631 Stiffness of right wrist, not elsewhere classified: Secondary | ICD-10-CM | POA: Diagnosis not present

## 2018-05-26 DIAGNOSIS — M6281 Muscle weakness (generalized): Secondary | ICD-10-CM | POA: Insufficient documentation

## 2018-05-26 DIAGNOSIS — L905 Scar conditions and fibrosis of skin: Secondary | ICD-10-CM | POA: Insufficient documentation

## 2018-05-26 NOTE — Therapy (Signed)
[facility] [address], [place], [address] Phone: [phone]   Fax:  [phone]  Occupational Therapy Evaluation  Patient Details  Name: Kellie Harris MRN: [id number] Date of Birth: [birth year] Referring Provider (OT): [place]   Encounter Date: 05/26/2018  OT End of Session - 05/26/18 1913    Visit Number  1    Number of Visits  12    Date for OT Re-Evaluation  07/07/18    OT Start Time  1410    OT Stop Time  1514    OT Time Calculation (min)  64 min    Activity Tolerance  Patient tolerated treatment well    Behavior During Therapy  [facility] for tasks assessed/performed       Past Medical History:  Diagnosis Date  . Arthritis    right knee, left shoulder/ OSTEO  . Cancer ([place])    skin   . Degenerative disc disease, lumbar    L4-L5  . Diverticulosis    ITIS  . History of hiatal hernia   . Hydronephrosis    right  . Hypercholesteremia   . Hypertension    CONTROLLED ON MEDS  . IBS (irritable bowel syndrome)   . Knee fracture, right 03/05/2015   DIFFICULTY WITH AMBULATION  . Osteopenia   . Ovarian cyst   . Pancreatic insufficiency   . Wears hearing aid    bilateral    Past Surgical History:  Procedure Laterality Date  . CATARACT EXTRACTION W/PHACO Right 11/02/2015   Procedure: CATARACT EXTRACTION PHACO AND INTRAOCULAR LENS PLACEMENT (IOC);  Surgeon: Leandrew Koyanagi, MD;  Location: [place];  Service: Ophthalmology;  Laterality: Right;  . CATARACT EXTRACTION W/PHACO Left 07/18/2016   Procedure: CATARACT EXTRACTION PHACO AND INTRAOCULAR LENS PLACEMENT (IOC);  Surgeon: Leandrew Koyanagi, MD;  Location: [place];  Service: Ophthalmology;  Laterality: Left;  . COLONOSCOPY    . FRACTURE SURGERY Left    hip  . history of left hip fracture    . INTRAMEDULLARY (IM) NAIL INTERTROCHANTERIC Left 02/12/2017   Procedure: INTRAMEDULLARY (IM) NAIL INTERTROCHANTRIC;  Surgeon: Earnestine Leys, MD;  Location: [facility];  Service: Orthopedics;  Laterality: Left;  . kidney stent    . OOPHORECTOMY Bilateral 2015  . ORIF WRIST FRACTURE Right 04/08/2018   Procedure: OPEN REDUCTION INTERNAL FIXATION (ORIF) WRIST FRACTURE;  Surgeon: Altamese Glen Campbell, MD;  Location: [place];  Service: Orthopedics;  Laterality: Right;  . TONSILLECTOMY    . TOTAL KNEE ARTHROPLASTY Right 08/07/2017   Procedure: TOTAL KNEE ARTHROPLASTY;  Surgeon: Earnestine Leys, MD;  Location: [facility];  Service: Orthopedics;  Laterality: Right;    There were no vitals filed for this visit.  Subjective Assessment - 05/26/18 1903    Subjective   I had hard time since past Dec- had R TKR , then fractured my L hip - and then I had this year 2 syncope spells with the last one I fractured my R wrist - I cannot drive for 6 months - they think I have my seizures , I had HH PT for my walking and balance but my cast just come off about week and 1/2 ago      Patient Stated Goals  I want to be able to use my R hand like before to open jars, cut food, lift/grip/ squeeze and hold objects , write better     Currently in Pain?  No/denies        [facility] OT  Assessment - 05/26/18 0001      Assessment   Medical Diagnosis  R distal radius fx with ORIF    Referring Provider (OT)  Handy    Onset Date/Surgical Date  04/08/18    Hand Dominance  Right    Next MD Visit  --   next week    Prior Therapy  --   HHPT for balance and walking     Balance Screen   Has the patient fallen in the past 6 months  Yes    How many times?  --   2   Has the patient had a decrease in activity level because of a fear of falling?   Yes    Is the patient reluctant to leave their home because of a fear of falling?   Yes      Home  Environment   Lives With  Alone      Prior Function   Vocation  Retired    Leisure  She has caretaker 4 hrs day , do not drive for 6 months from last fall , and not as active since Jan when fx her L hip - read , do light act  around the house , visit with friends       AROM   Right Wrist Extension  34 Degrees    Right Wrist Flexion  58 Degrees    Right Wrist Radial Deviation  15 Degrees    Right Wrist Ulnar Deviation  16 Degrees    Left Wrist Extension  74 Degrees    Left Wrist Flexion  98 Degrees    Left Wrist Radial Deviation  35 Degrees    Left Wrist Ulnar Deviation  35 Degrees      Strength   Right Hand Grip (lbs)  15    Right Hand Lateral Pinch  6 lbs    Right Hand 3 Point Pinch  6 lbs    Left Hand Grip (lbs)  35    Left Hand Lateral Pinch  11 lbs    Left Hand 3 Point Pinch  10 lbs       Fluidotherapy with wrist AROM - prior to review of HEP - decrease pain and increase ROM  Review HEP with pt and caretaker - hand out provided   Heat Scar massage ed on  And use of cica scar pad for night time  Cont splint to wear night time and out and about   PROM for wrist extention - prayer stretch And wrist PROM flexion over edge of table and RD, UD over edge of table  AAROM for wrist extention / flexion and  RD,UD  Hand out provided and review               OT Education - 05/26/18 1913    Education Details  findings of eval and HEP     Person(s) Educated  Associate Professor)    Methods  Demonstration;Handout    Comprehension  Returned demonstration;Verbalized understanding       OT Short Term Goals - 05/26/18 1929      OT SHORT TERM GOAL #1   Title  Pt to be independent in HEP to increase wrist AROM , strength  and decrease scar tissue  to improve use of R dominant hand and wrist in ADL's and IADl's     Baseline  no knowledge on HEP     Time  2    Period  Weeks    Status  New  Target Date  06/09/18        OT Long Term Goals - 05/26/18 1929      OT LONG TERM GOAL #1   Title  R wrist AROM improve with 10 to 20 degrees to turn doorknob and use in hygiene with out issues     Baseline  see flowsheet- show decrease AROM in all planes for wrist except pronation - sup is 85  degrees     Time  4    Period  Weeks    Status  New    Target Date  06/23/18      OT LONG TERM GOAL #2   Title  Pt R wrist strength increase for pt to pick up and carry more than 4 lbs without pain or issues to push door open, push up in bed,  and cut with knife     Baseline  no strengthening up to this date yet     Time  5    Period  Weeks    Status  New    Target Date  06/30/18      OT LONG TERM GOAL #3   Title  R grip  increase with 5-8 lbs and prehension with at least 2 lbs to be able to cut with knife , do buttons, open jars with more ease     Baseline  grip and prehension decrease - see flowsheet - 15 Lbs R , L 35 lbs     Time  6    Period  Weeks    Status  New    Target Date  07/07/18      OT LONG TERM GOAL #4   Title  Function score on PRWHE improve with more than 10 points     Baseline  function on PRWHE at eval 19/50     Time  6    Period  Weeks    Status  New    Target Date  07/07/18            Plan - 05/26/18 1914    Clinical Impression Statement  Pt present about 7 wks s/p ORIF of distal radius fx - per pt and caretaker cast come off about 2 wks ago - pt in prefab wrist splint - pt show decrease wrist PROM and AROM , decrease strength - limiting her functional use of  R dominant hand in ADL's and IADL's - pt  do use SPC in L hand - pt had few falls since Jan - cannot drive for 6 months- has caretaker for 4 hrs daily - had HHPT - and  has order for PT evall - schedule this date PT for next week prior to next session with OT      Occupational performance deficits (Please refer to evaluation for details):  ADL's;IADL's;Play;Leisure    Rehab Potential  Good    OT Frequency  --   1-2 x wk   OT Duration  6 weeks    OT Treatment/Interventions  Self-care/ADL training;Therapeutic exercise;Splinting;Patient/family education;Paraffin;Fluidtherapy    Plan  assess progress with HEP and upgrade as needed- 1 lbs weight and light putty     Clinical Decision Making  Several  treatment options, min-mod task modification necessary    OT Home Exercise Plan  see pt instruction    Consulted and Agree with Plan of Care  Patient       Patient will benefit from skilled therapeutic intervention in order to improve the following deficits and impairments:  Impaired flexibility, Pain, Decreased scar mobility, Impaired UE functional use, Decreased strength  Visit Diagnosis: Stiffness of right wrist, not elsewhere classified - Plan: Ot plan of care cert/re-cert  Muscle weakness (generalized) - Plan: Ot plan of care cert/re-cert  Scar condition and fibrosis of skin - Plan: Ot plan of care cert/re-cert    Problem List Patient Active Problem List   Diagnosis Date Noted  . S/P ORIF (open reduction internal fixation) fracture radius and ulnar fx 04/08/18 04/10/2018  . Distal radius and ulna fracture, right   . History of CVA (cerebrovascular accident)   . Benign essential HTN   . Tachycardia   . Leukocytosis   . Seizures ([place])   . Stroke-like episode ([place]) s/p IV tPA 04/03/2018  . Syncope   . TIA (transient ischemic attack) 11/04/2017  . Malnutrition of moderate degree 08/26/2017  . Total knee replacement status 08/07/2017  . Closed left hip fracture ([place]) 02/12/2017  . HTN (hypertension) 02/12/2017  . HLD (hyperlipidemia) 02/12/2017    Rosalyn Gess OTR/L,CLT 05/26/2018, 7:50 PM  [place] [facility] [address], [place], [address] Phone: [phone]   Fax:  [phone]  Name: MELROSE KEARSE MRN: [id number] Date of Birth: 07-19-1931

## 2018-05-26 NOTE — Patient Instructions (Signed)
Heat Scar massage ed on  And use of cica scar pad for night time  Cont splint to wear night time and out and about   PROM for wrist extention - prayer stretch And wrist PROM flexion over edge of table and RD, UD over edge of table  AAROM for wrist extention / flexion and  RD,UD  Hand out provided and review

## 2018-05-27 ENCOUNTER — Encounter: Payer: Self-pay | Admitting: *Deleted

## 2018-05-27 ENCOUNTER — Other Ambulatory Visit: Payer: Self-pay | Admitting: *Deleted

## 2018-05-27 NOTE — Patient Outreach (Signed)
[facility]) Care Management   05/27/2018  Kellie Harris 11/15/1930 [id number]  Kellie Harris is an 84 y.o. female   Initial home visit    Patient with recent Hospital admission at [facility], 8/22-8/29 with stroke like episode, fall, fracture of right radius/ulnar and ORIF [facility] stay 8/29 - 9/13.  PMHx: includes but not limited to HTN, HLD,IBS, TIA , left hip fracture Subjective:   Patient discussed feeling weak but getting a little stronger.  Patient discussed participating in outpatient therapy  Patient appetite okay but doesn't normally eat much, trying to eat smaller amounts more frequently .  Patient discussed rritable bowel symptoms  improved .   Objective:  BP 138/82 (BP Location: Left Arm, Patient Position: Sitting, Cuff Size: Normal)   Pulse 88   Resp 18   Ht 1.473 m (_0 )   Wt 98 lb (44.5 kg)   SpO2 96%   BMI 20.48 kg/m  Review of Systems  Constitutional: Negative.   HENT: Negative.   Eyes: Negative.   Respiratory: Negative.   Cardiovascular: Negative.   Gastrointestinal: Negative.   Genitourinary: Negative.   Musculoskeletal: Positive for joint pain.  Skin: Negative.   Neurological: Negative for dizziness.       General weakness   Psychiatric/Behavioral: Negative.     Physical Exam  Constitutional: She is oriented to person, place, and time. She appears well-developed and well-nourished.  Cardiovascular: Normal rate and normal heart sounds.  Respiratory: Effort normal and breath sounds normal.  GI: Soft.  Neurological: She is alert and oriented to person, place, and time.  Skin: Skin is warm and [name].  Psychiatric: She has a normal mood and affect. Her behavior is normal. Judgment and thought content normal.    Encounter Medications:   Outpatient Encounter Medications as of 05/27/2018  Medication Sig Note  . acetaminophen (TYLENOL) 500 MG tablet Take 500-1,000 mg by mouth every 6 (six) hours as needed (for pain or headaches).    [name] Kitchen amLODipine (NORVASC) 5 MG tablet Take 5 mg by mouth daily.   . clopidogrel (PLAVIX) 75 MG tablet Take 75 mg by mouth daily.   [name] Kitchen levETIRAcetam (KEPPRA XR) 500 MG 24 hr tablet Take 1 tablet (500 mg total) by mouth daily.   . pravastatin (PRAVACHOL) 20 MG tablet Take 1 tablet (20 mg total) by mouth at bedtime.   . Probiotic CAPS Take 1 capsule by mouth daily as needed (when IBS flares).   [name] Kitchen amoxicillin (AMOXIL) 500 MG capsule Take 2,000 mg by mouth See admin instructions. Take 2,000 mg by mouth one hour prior to dental procedures 04/28/2018: Has on hand   No facility-administered encounter medications on file as of 05/27/2018.     Functional Status:   In your present state of health, do you have any difficulty performing the following activities: 05/07/2018 04/06/2018  Hearing? N Y  Vision? N N  Difficulty concentrating or making decisions? N Y  Walking or climbing stairs? Y Y  Dressing or bathing? Kellie Harris  Comment has personal assistance at present  -  Doing errands, shopping? Kellie Harris  Comment daughter assist -  Conservation officer, nature and eating ? Y -  Comment has some assistance with meal prep -  Using the Toilet? N -  In the past six months, have you accidently leaked urine? N -  Do you have problems with loss of bowel control? N -  Managing your Medications? N -  Managing your Finances? Y -  Comment  daughter assist  -  Housekeeping or managing your Housekeeping? Y -  Comment has hired Geneticist, molecular  -  Some recent data might be hidden    Fall/Depression Screening:    Fall Risk  05/07/2018 09/27/2017 09/05/2017  Falls in the past year? Yes Yes Yes  Number falls in past yr: 2 or more 1 2 or more  Injury with Fall? Yes Yes Yes  Risk Factor Category  High Fall Risk High Fall Risk High Fall Risk  Risk for fall due to : History of fall(s);Impaired balance/gait History of fall(s);Impaired balance/gait;Impaired mobility History of fall(s);Impaired balance/gait;Impaired mobility  Follow up Falls  prevention discussed Falls evaluation completed;Falls prevention discussed Falls prevention discussed   PHQ 2/9 Scores 05/07/2018 09/05/2017  PHQ - 2 Score 1 0    Assessment:  Routine home visit   Recent syncope/seizure episode- no syncope, seizure episodes,  has attended follow neurology visit, continuing taking medications as prescribed.  Fracture wrist /general weakness/Fall risk - attending Outpatient OT for wrist fracture and to begin Out patient PT for strength and balance. Patient/family looking into improved medic alert , fall detection monitoring through [facility] vs [facility]. Patient continues with personal care assistance 5 days a week, good friend and family support.   Hypertension - monitoring blood pressures in home reading remaining less than 140/80, patient limits salt in diet.   Plan:  Provided and reviewed EMMI handout on preventing falls  Will plan follow up call in the next week for final transition of call  Will send PCP initial visit note    [facility] CM Care Plan Problem One     Most Recent Value  Care Plan Problem One  Recent hospital admission related to stroke symptoms, right wrist surgery , at risk for readmission .   Role Documenting the Problem One  Care Management [place] for Problem One  Active  THN Long Term Goal   Patient will not experience a hospital admission within the next 60  days  [goal extended ]  THN Long Term Goal Start Date  04/28/18  Interventions for Problem One Long Term Goal  Home visit completed, reinforced to notify MD of increase in weakness episodes or any symptoms of seizure activity reviewed.   THN CM Short Term Goal #1   Patient will attend medical appointment in the next 30  days   THN CM Short Term Goal #1 Start Date  04/28/18  [facility] CM Short Term Goal #1 Met Date  05/27/18  THN CM Short Term Goal #2   Patietn will be able to report increase in strength and balance over the next 30 days   THN CM Short Term Goal #2 Start Date  05/27/18  Barrie Folk extended ]  Interventions for Short Term Goal #2  Reinforced with patient attending therapy sessions and working on exercises provided, reinforced resting between activities., Reiinforced balanced diet , nutrition , trying small frequent meals , discussed examples of protien to include in diet.   THN CM Short Term Goal #3  Patient will be able to report no falls in the next 30 days   THN CM Short Term Goal #3 Start Date  05/27/18  Interventions for Short Tern Goal #3  provided and reviewed emmi on preventing falls, encouraged to wear current medic alert and to follow through on pursuing a new system.       [name], RN, [place] Management Coordinator  [phone]- Mobile [phone]- Toll Free Main Office

## 2018-06-02 ENCOUNTER — Other Ambulatory Visit: Payer: Self-pay | Admitting: *Deleted

## 2018-06-02 NOTE — Patient Outreach (Signed)
[facility]) Care Management  06/02/2018  Kellie Harris [birth year] [id number]   Transition of care call   Successful outreach call to patient , HIPAA verified.  Patient reports that she is doing pretty good, taking slow as she moves around her home.  Patient discussed taking a shower this am , but she had a family member in the home.  Patient discussed still trying to decide which medic alert system she will choose but looking into a system that she has GPS that will be able to pick up if she is away from home and has fall detection. She reports that family is helping to decide between [facility] system or ADT system that she is already connected with .   Patient reports looking forward to getting stronger , discussed occupational  therapy on her wrist and physical therapy for weakness.   Patient denies any new concerns.  She has completed transition of care , will continue to follow complex care management needs.    Plan  Will plan follow up telephone call in the next month.    THN CM Care Plan Problem One     Most Recent Value  Care Plan Problem One  Recent hospital admission related to stroke symptoms, right wrist surgery , at risk for readmission .   Role Documenting the Problem One  Care Management [place] for Problem One  Active  THN Long Term Goal   Patient will not experience a hospital admission within the next 60  days  [goal extended ]  THN Long Term Goal Start Date  04/28/18  Interventions for Problem One Long Term Goal  Discussed notifying MD of recurrent symptoms of weakness episodes or seizure type activity or any new concerns to arrange sooner office visit or seek urgent medical care for emergency symptoms   THN CM Short Term Goal #1   Patient will attend medical appointment in the next 30  days   THN CM Short Term Goal #1 Start Date  04/28/18  [facility] CM Short Term Goal #1 Met Date  05/27/18  THN CM Short Term Goal #2   Patietn will be able to  report increase in strength and balance over the next 30 days   THN CM Short Term Goal #2 Start Date  05/27/18 [goal extended ]  Interventions for Short Term Goal #2  Encouraged doing home exercises as provided, enccouraged small meals throughout day   [facility] CM Short Term Goal #3  Patient will be able to report no falls in the next 30 days   THN CM Short Term Goal #3 Start Date  05/27/18  Interventions for Short Tern Goal #3  fall precautions reviewed, reinforced use of medic alert       [name], RN, [place] Management Coordinator  [phone]- Mobile [phone]- [place]

## 2018-06-03 ENCOUNTER — Telehealth: Payer: Self-pay | Admitting: Neurology

## 2018-06-03 ENCOUNTER — Ambulatory Visit: Payer: PPO | Admitting: Occupational Therapy

## 2018-06-03 ENCOUNTER — Ambulatory Visit: Payer: PPO

## 2018-06-03 ENCOUNTER — Other Ambulatory Visit: Payer: Self-pay

## 2018-06-03 DIAGNOSIS — R262 Difficulty in walking, not elsewhere classified: Secondary | ICD-10-CM

## 2018-06-03 DIAGNOSIS — M25631 Stiffness of right wrist, not elsewhere classified: Secondary | ICD-10-CM

## 2018-06-03 DIAGNOSIS — L905 Scar conditions and fibrosis of skin: Secondary | ICD-10-CM

## 2018-06-03 DIAGNOSIS — Z9181 History of falling: Secondary | ICD-10-CM

## 2018-06-03 DIAGNOSIS — M6281 Muscle weakness (generalized): Secondary | ICD-10-CM

## 2018-06-03 MED ORDER — LEVETIRACETAM ER 500 MG PO TB24: 500.0000 mg | ORAL_TABLET | Freq: Every day | ORAL | 3 refills | Status: DC

## 2018-06-03 NOTE — Telephone Encounter (Signed)
Pt states she is still waiting on her levETIRAcetam (KEPPRA XR) 500 MG 24 hr tablet for 1 year to be sent to  [facility] [place] [facility] [place] [phone] (Phone) [phone] (Fax)   She has checked with pharmacy and they dont have the refill request from Dr Leonie Man

## 2018-06-03 NOTE — Telephone Encounter (Signed)
Refill sent for year to pts pharmacy for a year. Pt needs to keep appt because we manage her seizures.

## 2018-06-03 NOTE — Therapy (Signed)
[facility] [address], [place], [address] Phone: [phone]   Fax:  [phone]  Physical Therapy Evaluation  Patient Details  Name: Kellie Harris MRN: [id number] Date of Birth: 1931/04/14 Referring Provider (PT): Antony Contras, MD   Encounter Date: 06/03/2018  PT End of Session - 06/03/18 1349    Visit Number  1    Number of Visits  17    Date for PT Re-Evaluation  07/31/18    Authorization Type  1    Authorization Time Period  of 10 progress report    PT Start Time  1350    PT Stop Time  1448    PT Time Calculation (min)  58 min    Equipment Utilized During Treatment  Gait belt    Activity Tolerance  Patient tolerated treatment well    Behavior During Therapy  WFL for tasks assessed/performed       Past Medical History:  Diagnosis Date  . Arthritis    right knee, left shoulder/ OSTEO  . Cancer ([place])    skin   . Degenerative disc disease, lumbar    L4-L5  . Diverticulosis    ITIS  . History of hiatal hernia   . Hydronephrosis    right  . Hypercholesteremia   . Hypertension    CONTROLLED ON MEDS  . IBS (irritable bowel syndrome)   . Knee fracture, right 03/05/2015   DIFFICULTY WITH AMBULATION  . Osteopenia   . Ovarian cyst   . Pancreatic insufficiency   . Wears hearing aid    bilateral    Past Surgical History:  Procedure Laterality Date  . CATARACT EXTRACTION W/PHACO Right 11/02/2015   Procedure: CATARACT EXTRACTION PHACO AND INTRAOCULAR LENS PLACEMENT (IOC);  Surgeon: Leandrew Koyanagi, MD;  Location: [facility];  Service: Ophthalmology;  Laterality: Right;  . CATARACT EXTRACTION W/PHACO Left 07/18/2016   Procedure: CATARACT EXTRACTION PHACO AND INTRAOCULAR LENS PLACEMENT (IOC);  Surgeon: Leandrew Koyanagi, MD;  Location: [facility];  Service: Ophthalmology;  Laterality: Left;  . COLONOSCOPY    . FRACTURE SURGERY Left    hip  . history of left hip fracture     . INTRAMEDULLARY (IM) NAIL INTERTROCHANTERIC Left 02/12/2017   Procedure: INTRAMEDULLARY (IM) NAIL INTERTROCHANTRIC;  Surgeon: Earnestine Leys, MD;  Location: [facility];  Service: Orthopedics;  Laterality: Left;  . kidney stent    . OOPHORECTOMY Bilateral 2015  . ORIF WRIST FRACTURE Right 04/08/2018   Procedure: OPEN REDUCTION INTERNAL FIXATION (ORIF) WRIST FRACTURE;  Surgeon: Altamese Hatton, MD;  Location: [facility];  Service: Orthopedics;  Laterality: Right;  . TONSILLECTOMY    . TOTAL KNEE ARTHROPLASTY Right 08/07/2017   Procedure: TOTAL KNEE ARTHROPLASTY;  Surgeon: Earnestine Leys, MD;  Location: [facility];  Service: Orthopedics;  Laterality: Right;    There were no vitals filed for this visit.   Subjective Assessment - 06/03/18 1352    Subjective  No back pain. Has arthritis in B anterior shoulders. 1/10 L shoulder, 0/10 R shoulder currently (pt sitting and resting); 5/10 L shoulder and 4/10 R shoulder at most for the past 7 days.  Takes Tylenol for pain 2x per day.  L shoulder pain has occured for at least a year.  L head of femur fracture was in January 2019. Was not able to go to her follow up session with her orthopedic doctor in [facility] 2019.  Does not know of any weight bearing restrictions L LE.  Has never had syncope before, just dizzy spells. Has been using her cane on and off since the falls started.  No AD prior to her knee surgery.     Pertinent History  Pt states being referred to PT for balance and gait. Gait and balance has been going dowhnill since December 2018 after her R TKA.  [place] after being discharged from skilled nursing facility and fractured the head of her L femur. Did not have surgery due to the fracture not being bad. Had a sycope episode March 2019. She was out all of a sudden. Had a total stroke work up and was within normal limits.  Was coming along alright. However, had another syncopal episode 04/03/2018. Pt was standing and talking to her neighbor. Does not remember  the ambulance coming. She seemed like she had a stroke. Had a second stroke work up and the results were within normal limits. Took her longer to recover from her syncopal episode.  Currently taking plavix.  Currently taking the medication Kepra for seizzures. Pt states that when the episodes occur, there is no warning.   Had R wrist fracture ORIF on 04/08/2018.  Pt currently NWB R wrist.   Mainly uses her hurricain SPC.  Furniture walk at home. Also has a 3 wheeler and 4 wheeler rollator.  No falls other than due to her syncopal episodes. Pt states CPR can be performed if needed. Nothing long term. Can call ambulance if pt passes out or has a seizure. Neighbor was on her L side when talking to her prior to passing out.       Patient Stated Goals  Be able to walk better.    Currently in Pain?  Yes    Pain Score  1    L shoulder   Pain Location  Shoulder    Pain Orientation  Right;Left;Anterior    Pain Type  Chronic pain    Pain Onset  More than a month ago    Pain Frequency  Occasional         [facility] PT Assessment - 06/03/18 0001      Assessment   Medical Diagnosis  Stroke-like episode    Referring Provider (PT)  Antony Contras, MD    Onset Date/Surgical Date  05/22/18   Date PT referral signed. Chronic condition   Hand Dominance  Right    Prior Therapy  --   HHPT for balance and walking     Precautions   Precaution Comments  fall risk      Balance Screen   Has the patient fallen in the past 6 months  Yes    How many times?  1   04/03/2018 due to syncope     Prior Function   Vocation  Retired    Leisure  She has caretaker 4 hrs day , do not drive for 6 months from last fall , and not as active since Jan when fx her L hip - read , do light act around the house , visit with friends       Observation/Other Assessments   Observations  TUG with SPC on L side 18.4 seconds average    Focus on Therapeutic Outcomes (FOTO)   lower leg FOTO 40      Posture/Postural Control   Posture  Comments  R genu valgus > L, R foot pronation, bilaterally protracted shoulders, L shoulder lower.       AROM   Cervical - Right Rotation  [facility]  no symptoms   Cervical - Left Rotation  WFL   no symptoms     Strength   Right Hip Flexion  4/5    Right Hip Extension  4+/5   seated manually resisted   Right Hip ABduction  4/5   seated manually resisted clamshell   Left Hip Flexion  3+/5    Left Hip Extension  4/5   seated manually resisted   Left Hip ABduction  4/5   seated manually resisted clamshell   Right Knee Flexion  4+/5    Right Knee Extension  4+/5    Left Knee Flexion  4/5    Left Knee Extension  4+/5      Ambulation/Gait   Gait Comments  with rw. decreased stance L LE with R pelvic drop. L lateral lean, decreased trunk control                 Objective measurements completed on examination: See above findings.        Blood pressure L arm sitting, normal cuff: 150/78, HR 79  Blood pressure L arm standing after 2 minutes 155/101, HR 91. Denies dizziness   Blood pressure L arm standing after seated muscle strength testing, mechanically taken, normal cuff: 151/90, HR 94    No latex band allergies    TUG  with rw 19 seconds  With SPC on L side 19.82 seconds, 18.11 seconds, 17.17 seconds (18.4 seconds average)      Ther-ex   Seated clamshells 10x3 yellow band  Improved exercise technique, movement at target joints, use of target muscles after mod verbal, visual, tactile cues.     Patient is an 84 year old female who came to physical therapy secondary to difficulty with gait and balance. She also presents with altered gait pattern and posture, bilateral LE weakness, decreased balance, increased TUG times suggesting increased fall risk and decreased functional mobility, and difficulty performing functional tasks such as walking. Patient will benefit from skilled physical therapy services to address the aforementioned deficits.        PT  Education - 06/03/18 1920    Education Details  ther-ex, HEP, plan of care    Person(s) Educated  Patient    Methods  Explanation;Demonstration;Tactile cues;Verbal cues    Comprehension  Returned demonstration;Verbalized understanding       PT Short Term Goals - 06/03/18 1858      PT SHORT TERM GOAL #1   Title  Patient will be independent with her HEP to promote balance and decrease fall risk.     Time  3    Period  Weeks    Status  New    Target Date  06/26/18        PT Long Term Goals - 06/03/18 1858      PT LONG TERM GOAL #1   Title  Patient will improve B LE strength by at least 1/2 MMT grade to promote ability to perform standing tasks with decreased fall risk.     Time  8    Period  Weeks    Status  New    Target Date  07/31/18      PT LONG TERM GOAL #2   Title  Patient will improve TUG time to 12 seconds or less with use of SPC to promote functional mobility and decrease fall risk.     Baseline  18.4 seconds on average with SPC on L (06/03/2018)    Time  8    Period  Weeks    Status  New    Target Date  07/31/18      PT LONG TERM GOAL #3   Title  Patient will improve FOTO score by at least 10 points as a demonstration of improved function.     Baseline  Lower Leg FOTO 40 (06/03/2018)    Time  8    Period  Weeks    Status  New    Target Date  07/31/18      PT LONG TERM GOAL #4   Title  Patient will be able to ambulate 100 ft or more with SBA to independent to promote mobility.     Baseline  Pt currently ambulates with SPC (06/03/2018)    Time  8    Period  Weeks    Status  New    Target Date  07/31/18             Plan - 06/03/18 1848    Clinical Impression Statement  Patient is an 84 year old female who came to physical therapy secondary to difficulty with gait and balance. She also presents with altered gait pattern and posture, bilateral LE weakness, decreased balance, increased TUG times suggesting increased fall risk and decreased functional  mobility, and difficulty performing functional tasks such as walking. Patient will benefit from skilled physical therapy services to address the aforementioned deficits.     History and Personal Factors relevant to plan of care:  age, medical history, falls due to syncopal episodes which appear to come without warning, decreased strength, LE weakness, difficulty walking, difficulty performing standing tasks unassisted.     Clinical Presentation  Stable    Clinical Presentation due to:  Pt seems to not know whether her symptoms are better, worse or the same since onset.     Clinical Decision Making  Low    Rehab Potential  Fair    Clinical Impairments Affecting Rehab Potential  (-) age, hx of falls, weakness, medical history; (+) motivated    PT Frequency  2x / week    PT Duration  8 weeks    PT Treatment/Interventions  Therapeutic activities;Therapeutic exercise;Neuromuscular re-education;Balance training;Patient/family education;Manual techniques;[place] needling;Aquatic Therapy;Electrical Stimulation;Iontophoresis 4mg /ml Dexamethasone;Gait training    PT Next Visit Plan  trunk, B hip and LE strengthening, balance, gait, manual techniques and modalities PRN.     Consulted and Agree with Plan of Care  Patient       Patient will benefit from skilled therapeutic intervention in order to improve the following deficits and impairments:  Postural dysfunction, Improper body mechanics, Difficulty walking, Decreased strength, Decreased balance, Abnormal gait  Visit Diagnosis: History of falling - Plan: PT plan of care cert/re-cert  Muscle weakness (generalized) - Plan: PT plan of care cert/re-cert  Difficulty in walking, not elsewhere classified - Plan: PT plan of care cert/re-cert     Problem List Patient Active Problem List   Diagnosis Date Noted  . S/P ORIF (open reduction internal fixation) fracture radius and ulnar fx 04/08/18 04/10/2018  . Distal radius and ulna fracture, right   . History  of CVA (cerebrovascular accident)   . Benign essential HTN   . Tachycardia   . Leukocytosis   . Seizures ([place])   . Stroke-like episode ([place]) s/p IV tPA 04/03/2018  . Syncope   . TIA (transient ischemic attack) 11/04/2017  . Malnutrition of moderate degree 08/26/2017  . Total knee replacement status 08/07/2017  . Closed left hip fracture ([place]) 02/12/2017  . HTN (hypertension)  02/12/2017  . HLD (hyperlipidemia) 02/12/2017   Joneen Boers PT, DPT   06/03/2018, 7:24 PM  [facility] [address] [place], [address] Phone: [phone]   Fax:  [phone]  Name: Kellie Harris MRN: [id number] Date of Birth: 29-Oct-1930

## 2018-06-03 NOTE — Therapy (Signed)
[facility] [address] [place], [address] Phone: [phone]   Fax:  [phone]  Occupational Therapy Treatment  Patient Details  Name: Kellie Harris MRN: [id number] Date of Birth: [birth year] Referring Provider (OT): [place]   Encounter Date: 06/03/2018  OT End of Session - 06/03/18 2876    Visit Number  2    Number of Visits  12    Date for OT Re-Evaluation  07/07/18    OT Start Time  1446    OT Stop Time  1520    OT Time Calculation (min)  34 min    Activity Tolerance  Patient tolerated treatment well    Behavior During Therapy  [facility] for tasks assessed/performed       Past Medical History:  Diagnosis Date  . Arthritis    right knee, left shoulder/ OSTEO  . Cancer ([place])    skin   . Degenerative disc disease, lumbar    L4-L5  . Diverticulosis    ITIS  . History of hiatal hernia   . Hydronephrosis    right  . Hypercholesteremia   . Hypertension    CONTROLLED ON MEDS  . IBS (irritable bowel syndrome)   . Knee fracture, right 03/05/2015   DIFFICULTY WITH AMBULATION  . Osteopenia   . Ovarian cyst   . Pancreatic insufficiency   . Wears hearing aid    bilateral    Past Surgical History:  Procedure Laterality Date  . CATARACT EXTRACTION W/PHACO Right 11/02/2015   Procedure: CATARACT EXTRACTION PHACO AND INTRAOCULAR LENS PLACEMENT (IOC);  Surgeon: Leandrew Koyanagi, MD;  Location: [facility];  Service: Ophthalmology;  Laterality: Right;  . CATARACT EXTRACTION W/PHACO Left 07/18/2016   Procedure: CATARACT EXTRACTION PHACO AND INTRAOCULAR LENS PLACEMENT (IOC);  Surgeon: Leandrew Koyanagi, MD;  Location: [facility];  Service: Ophthalmology;  Laterality: Left;  . COLONOSCOPY    . FRACTURE SURGERY Left    hip  . history of left hip fracture    . INTRAMEDULLARY (IM) NAIL INTERTROCHANTERIC Left 02/12/2017   Procedure: INTRAMEDULLARY (IM) NAIL INTERTROCHANTRIC;  Surgeon: Earnestine Leys, MD;  Location: [facility];  Service: Orthopedics;  Laterality: Left;  . kidney stent    . OOPHORECTOMY Bilateral 2015  . ORIF WRIST FRACTURE Right 04/08/2018   Procedure: OPEN REDUCTION INTERNAL FIXATION (ORIF) WRIST FRACTURE;  Surgeon: Altamese Wakita, MD;  Location: [facility];  Service: Orthopedics;  Laterality: Right;  . TONSILLECTOMY    . TOTAL KNEE ARTHROPLASTY Right 08/07/2017   Procedure: TOTAL KNEE ARTHROPLASTY;  Surgeon: Earnestine Leys, MD;  Location: [facility];  Service: Orthopedics;  Laterality: Right;    There were no vitals filed for this visit.  Subjective Assessment - 06/03/18 1641    Subjective   I did get a one and 2 lbs weight - doing okay -did the exercises - I just had PT eval for my LE weakness and balance     Patient Stated Goals  I want to be able to use my R hand like before to open jars, cut food, lift/grip/ squeeze and hold objects , write better     Currently in Pain?  No/denies          AROM assess- wrist flexion , RD, UD improve but extention about the same  scar mobility improve - still some adhesions distal - to cont using Cica scar pad for night time   can wean out of splint for wrist- during day-  Still wear when out and about          OT Treatments/Exercises (OP) - 06/03/18 0001      RUE Fluidotherapy   Number Minutes Fluidotherapy  8 Minutes    RUE Fluidotherapy Location  Hand;Wrist    Comments  AROM at [facility] to increase ROM and decrease stiffness       Scar mobs done manual by OT - and pt and caregiver ed on technique and where to focus on issued next Cica scar pad for night time    Review PROM for wrist ext, flexion ,RD,UD   needed min to mod A for extention - over edge of table - to prevent shoulder from bothering her and prayer stretch  Cont with others  And replace AROM for wrist with 1 lbs weight for wrist in all planes   no pain this date - to do 10-15 reps   2 x day        OT Education - 06/03/18 1644    Education  Details  HEP review and add strengthening     Person(s) Educated  Patient;Caregiver(s)    Methods  Demonstration;Handout    Comprehension  Returned demonstration;Verbalized understanding       OT Short Term Goals - 05/26/18 1929      OT SHORT TERM GOAL #1   Title  Pt to be independent in HEP to increase wrist AROM , strength  and decrease scar tissue  to improve use of R dominant hand and wrist in ADL's and IADl's     Baseline  no knowledge on HEP     Time  2    Period  Weeks    Status  New    Target Date  06/09/18        OT Long Term Goals - 05/26/18 1929      OT LONG TERM GOAL #1   Title  R wrist AROM improve with 10 to 20 degrees to turn doorknob and use in hygiene with out issues     Baseline  see flowsheet- show decrease AROM in all planes for wrist except pronation - sup is 85 degrees     Time  4    Period  Weeks    Status  New    Target Date  06/23/18      OT LONG TERM GOAL #2   Title  Pt R wrist strength increase for pt to pick up and carry more than 4 lbs without pain or issues to push door open, push up in bed,  and cut with knife     Baseline  no strengthening up to this date yet     Time  5    Period  Weeks    Status  New    Target Date  06/30/18      OT LONG TERM GOAL #3   Title  R grip  increase with 5-8 lbs and prehension with at least 2 lbs to be able to cut with knife , do buttons, open jars with more ease     Baseline  grip and prehension decrease - see flowsheet - 15 Lbs R , L 35 lbs     Time  6    Period  Weeks    Status  New    Target Date  07/07/18      OT LONG TERM GOAL #4   Title  Function score on PRWHE improve with more than 10 points  Baseline  function on PRWHE at eval 19/50     Time  6    Period  Weeks    Status  New    Target Date  07/07/18            Plan - 06/03/18 1645    Clinical Impression Statement  Pt present about  7 1/2 wks s/p ORIF of distal radius - great pogress in  wrist ROM - except extention - intiated this  date strengthening - and pt to wean out of hard prefab wrist splint     Occupational performance deficits (Please refer to evaluation for details):  ADL's;IADL's;Play;Leisure    Rehab Potential  Good    OT Frequency  --   1-2 x wk   OT Duration  6 weeks    OT Treatment/Interventions  Self-care/ADL training;Therapeutic exercise;Splinting;Patient/family education;Paraffin;Fluidtherapy    Plan  assess progress with HEP and progress with 1 lbs weight and add next time  light putty     Clinical Decision Making  Several treatment options, min-mod task modification necessary    OT Home Exercise Plan  see pt instruction    Consulted and Agree with Plan of Care  Patient       Patient will benefit from skilled therapeutic intervention in order to improve the following deficits and impairments:  Impaired flexibility, Pain, Decreased scar mobility, Impaired UE functional use, Decreased strength  Visit Diagnosis: Stiffness of right wrist, not elsewhere classified  Muscle weakness (generalized)  Scar condition and fibrosis of skin    Problem List Patient Active Problem List   Diagnosis Date Noted  . S/P ORIF (open reduction internal fixation) fracture radius and ulnar fx 04/08/18 04/10/2018  . Distal radius and ulna fracture, right   . History of CVA (cerebrovascular accident)   . Benign essential HTN   . Tachycardia   . Leukocytosis   . Seizures ([place])   . Stroke-like episode ([place]) s/p IV tPA 04/03/2018  . Syncope   . TIA (transient ischemic attack) 11/04/2017  . Malnutrition of moderate degree 08/26/2017  . Total knee replacement status 08/07/2017  . Closed left hip fracture ([place]) 02/12/2017  . HTN (hypertension) 02/12/2017  . HLD (hyperlipidemia) 02/12/2017    Rosalyn Gess OTR/L,CLT 06/03/2018, 4:50 PM  [facility] [address], [place], [address] Phone: [phone]   Fax:  [phone]  Name: Kellie Harris MRN: [id number] Date of Birth: April 10, 1931

## 2018-06-03 NOTE — Patient Instructions (Signed)
Same HEP   but add strengthening for wrist in all planes  for 1 lbs weight  After PROM and stretches

## 2018-06-04 DIAGNOSIS — S52501D Unspecified fracture of the lower end of right radius, subsequent encounter for closed fracture with routine healing: Secondary | ICD-10-CM | POA: Diagnosis not present

## 2018-06-05 ENCOUNTER — Ambulatory Visit: Payer: PPO

## 2018-06-05 ENCOUNTER — Ambulatory Visit: Payer: PPO | Admitting: Occupational Therapy

## 2018-06-05 DIAGNOSIS — M25631 Stiffness of right wrist, not elsewhere classified: Secondary | ICD-10-CM | POA: Diagnosis not present

## 2018-06-05 DIAGNOSIS — L905 Scar conditions and fibrosis of skin: Secondary | ICD-10-CM

## 2018-06-05 DIAGNOSIS — M6281 Muscle weakness (generalized): Secondary | ICD-10-CM

## 2018-06-05 DIAGNOSIS — Z9181 History of falling: Secondary | ICD-10-CM

## 2018-06-05 DIAGNOSIS — R262 Difficulty in walking, not elsewhere classified: Secondary | ICD-10-CM

## 2018-06-05 NOTE — Therapy (Signed)
[facility] [address] [place], [address] Phone: [phone]   Fax:  [phone]  Physical Therapy Treatment  Patient Details  Name: Kellie Harris MRN: [id number] Date of Birth: 04-10-1931 Referring Provider (PT): Antony Contras, MD   Encounter Date: 06/05/2018  PT End of Session - 06/05/18 1433    Visit Number  2    Number of Visits  17    Date for PT Re-Evaluation  07/31/18    Authorization Type  2    Authorization Time Period  of 10 progress report    PT Start Time  1433    PT Stop Time  1514    PT Time Calculation (min)  41 min    Equipment Utilized During Treatment  Gait belt    Activity Tolerance  Patient tolerated treatment well    Behavior During Therapy  WFL for tasks assessed/performed       Past Medical History:  Diagnosis Date  . Arthritis    right knee, left shoulder/ OSTEO  . Cancer ([place])    skin   . Degenerative disc disease, lumbar    L4-L5  . Diverticulosis    ITIS  . History of hiatal hernia   . Hydronephrosis    right  . Hypercholesteremia   . Hypertension    CONTROLLED ON MEDS  . IBS (irritable bowel syndrome)   . Knee fracture, right 03/05/2015   DIFFICULTY WITH AMBULATION  . Osteopenia   . Ovarian cyst   . Pancreatic insufficiency   . Wears hearing aid    bilateral    Past Surgical History:  Procedure Laterality Date  . CATARACT EXTRACTION W/PHACO Right 11/02/2015   Procedure: CATARACT EXTRACTION PHACO AND INTRAOCULAR LENS PLACEMENT (IOC);  Surgeon: Leandrew Koyanagi, MD;  Location: [facility];  Service: Ophthalmology;  Laterality: Right;  . CATARACT EXTRACTION W/PHACO Left 07/18/2016   Procedure: CATARACT EXTRACTION PHACO AND INTRAOCULAR LENS PLACEMENT (IOC);  Surgeon: Leandrew Koyanagi, MD;  Location: [facility];  Service: Ophthalmology;  Laterality: Left;  . COLONOSCOPY    . FRACTURE SURGERY Left    hip  . history of left hip fracture     . INTRAMEDULLARY (IM) NAIL INTERTROCHANTERIC Left 02/12/2017   Procedure: INTRAMEDULLARY (IM) NAIL INTERTROCHANTRIC;  Surgeon: Earnestine Leys, MD;  Location: [facility];  Service: Orthopedics;  Laterality: Left;  . kidney stent    . OOPHORECTOMY Bilateral 2015  . ORIF WRIST FRACTURE Right 04/08/2018   Procedure: OPEN REDUCTION INTERNAL FIXATION (ORIF) WRIST FRACTURE;  Surgeon: Altamese Plainville, MD;  Location: [facility];  Service: Orthopedics;  Laterality: Right;  . TONSILLECTOMY    . TOTAL KNEE ARTHROPLASTY Right 08/07/2017   Procedure: TOTAL KNEE ARTHROPLASTY;  Surgeon: Earnestine Leys, MD;  Location: [facility];  Service: Orthopedics;  Laterality: Right;    There were no vitals filed for this visit.  Subjective Assessment - 06/05/18 1439    Subjective  Pt states her R wrist is WBAT, not going to see surgeon (was discharged) unless she needs him. 1/10 B shoulders at rest. No back, or LE pain    Pertinent History  Pt states being referred to PT for balance and gait. Gait and balance has been going dowhnill since December 2018 after her R TKA.  [place] after being discharged from skilled nursing facility and fractured the head of her L femur. Did not have surgery due to the fracture not being bad. Had a sycope episode March 2019. She  was out all of a sudden. Had a total stroke work up and was within normal limits.  Was coming along alright. However, had another syncopal episode 04/03/2018. Pt was standing and talking to her neighbor. Does not remember the ambulance coming. She seemed like she had a stroke. Had a second stroke work up and the results were within normal limits. Took her longer to recover from her syncopal episode.  Currently taking plavix.  Currently taking the medication Kepra for seizzures. Pt states that when the episodes occur, there is no warning.   Had R wrist fracture ORIF on 04/08/2018.  Pt currently NWB R wrist.   Mainly uses her hurricain SPC.  Furniture walk at home. Also has a 3 wheeler and 4  wheeler rollator.  No falls other than due to her syncopal episodes. Pt states CPR can be performed if needed. Nothing long term. Can call ambulance if pt passes out or has a seizure. Neighbor was on her L side when talking to her prior to passing out.       Patient Stated Goals  Be able to walk better.    Currently in Pain?  Yes    Pain Score  1    B shoulders   Pain Onset  More than a month ago                                PT Education - 06/05/18 1451    Education Details  ther-ex    [employer]) Educated  Patient    Methods  Explanation;Demonstration;Tactile cues;Verbal cues    Comprehension  Returned demonstration;Verbalized understanding        Objectives  Ther-ex   Blood pressure L arm sitting, mechanically taken, normal cuff: 147/85, HR 76  SpO2: 97% room air   Seated clamshells 10x3 yellow band  LAQ with glute max squeeze   L 1 lb 5x3  R 1 lb 5x3  Forward step up onto Air Ex pad with B UE assist   R 5x3  L 5x3  Sit <> stand from regular chair with emphasis on femoral control 5x with B UE assist   Side stepping 5 ft to the R and 5 ft to the L 2.5 times. L hip discomfort. Eases with rest   Standing hip abduction with B UE assist to promote glute med muscle use  R 5x2  L 5x2  Sitting with back unsupported  Manually resisted trunk flexion 5x5 seconds for 3 sets   B scapular retraction 5x3   Decreased R shoulder pain    Improved exercise technique, movement at target joints, use of target muscles after mod verbal, visual, tactile cues.   Worked on LE strengthening to promote ability to better support herself with standing tasks and walking, and to help decrease fall risk.  Also worked on trunk muscle strengthening to promote trunk control when walking. Pt tolerated session well without aggravation of symptoms.      PT Short Term Goals - 06/03/18 1858      PT SHORT TERM GOAL #1   Title  Patient will be independent with her HEP  to promote balance and decrease fall risk.     Time  3    Period  Weeks    Status  New    Target Date  06/26/18        PT Long Term Goals - 06/03/18 1858      PT  LONG TERM GOAL #1   Title  Patient will improve B LE strength by at least 1/2 MMT grade to promote ability to perform standing tasks with decreased fall risk.     Time  8    Period  Weeks    Status  New    Target Date  07/31/18      PT LONG TERM GOAL #2   Title  Patient will improve TUG time to 12 seconds or less with use of SPC to promote functional mobility and decrease fall risk.     Baseline  18.4 seconds on average with SPC on L (06/03/2018)    Time  8    Period  Weeks    Status  New    Target Date  07/31/18      PT LONG TERM GOAL #3   Title  Patient will improve FOTO score by at least 10 points as a demonstration of improved function.     Baseline  Lower Leg FOTO 40 (06/03/2018)    Time  8    Period  Weeks    Status  New    Target Date  07/31/18      PT LONG TERM GOAL #4   Title  Patient will be able to ambulate 100 ft or more with SBA to independent to promote mobility.     Baseline  Pt currently ambulates with SPC (06/03/2018)    Time  8    Period  Weeks    Status  New    Target Date  07/31/18            Plan - 06/05/18 1432    Clinical Impression Statement  Worked on LE strengthening to promote ability to better support herself with standing tasks and walking, and to help decrease fall risk.  Also worked on trunk muscle strengthening to promote trunk control when walking. Pt tolerated session well without aggravation of symptoms.     Rehab Potential  Fair    Clinical Impairments Affecting Rehab Potential  (-) age, hx of falls, weakness, medical history; (+) motivated    PT Frequency  2x / week    PT Duration  8 weeks    PT Treatment/Interventions  Therapeutic activities;Therapeutic exercise;Neuromuscular re-education;Balance training;Patient/family education;Manual techniques;Etzler  needling;Aquatic Therapy;Electrical Stimulation;Iontophoresis 4mg /ml Dexamethasone;Gait training    PT Next Visit Plan  trunk, B hip and LE strengthening, balance, gait, manual techniques and modalities PRN.     Consulted and Agree with Plan of Care  Patient       Patient will benefit from skilled therapeutic intervention in order to improve the following deficits and impairments:  Postural dysfunction, Improper body mechanics, Difficulty walking, Decreased strength, Decreased balance, Abnormal gait  Visit Diagnosis: Muscle weakness (generalized)  History of falling  Difficulty in walking, not elsewhere classified     Problem List Patient Active Problem List   Diagnosis Date Noted  . S/P ORIF (open reduction internal fixation) fracture radius and ulnar fx 04/08/18 04/10/2018  . Distal radius and ulna fracture, right   . History of CVA (cerebrovascular accident)   . Benign essential HTN   . Tachycardia   . Leukocytosis   . Seizures ([place])   . Stroke-like episode ([place]) s/p IV tPA 04/03/2018  . Syncope   . TIA (transient ischemic attack) 11/04/2017  . Malnutrition of moderate degree 08/26/2017  . Total knee replacement status 08/07/2017  . Closed left hip fracture ([place]) 02/12/2017  . HTN (hypertension) 02/12/2017  . HLD (hyperlipidemia)  02/12/2017    Joneen Boers PT, DPT   06/05/2018, 3:30 PM  [facility] [address], [place], [address] Phone: [phone]   Fax:  [phone]  Name: Kellie Harris MRN: [id number] Date of Birth: Mar 11, 1931

## 2018-06-05 NOTE — Therapy (Signed)
[facility] [address] [place], [address] Phone: [phone]   Fax:  [phone]  Occupational Therapy Treatment  Patient Details  Name: Kellie Harris MRN: [id number] Date of Birth: [birth year] Referring Provider (OT): [place]   Encounter Date: 06/05/2018  OT End of Session - 06/05/18 1329    Visit Number  3    Number of Visits  12    Date for OT Re-Evaluation  07/07/18    OT Start Time  1304    OT Stop Time  1400    OT Time Calculation (min)  56 min    Activity Tolerance  Patient tolerated treatment well    Behavior During Therapy  [facility] for tasks assessed/performed       Past Medical History:  Diagnosis Date  . Arthritis    right knee, left shoulder/ OSTEO  . Cancer ([place])    skin   . Degenerative disc disease, lumbar    L4-L5  . Diverticulosis    ITIS  . History of hiatal hernia   . Hydronephrosis    right  . Hypercholesteremia   . Hypertension    CONTROLLED ON MEDS  . IBS (irritable bowel syndrome)   . Knee fracture, right 03/05/2015   DIFFICULTY WITH AMBULATION  . Osteopenia   . Ovarian cyst   . Pancreatic insufficiency   . Wears hearing aid    bilateral    Past Surgical History:  Procedure Laterality Date  . CATARACT EXTRACTION W/PHACO Right 11/02/2015   Procedure: CATARACT EXTRACTION PHACO AND INTRAOCULAR LENS PLACEMENT (IOC);  Surgeon: Leandrew Koyanagi, MD;  Location: [facility];  Service: Ophthalmology;  Laterality: Right;  . CATARACT EXTRACTION W/PHACO Left 07/18/2016   Procedure: CATARACT EXTRACTION PHACO AND INTRAOCULAR LENS PLACEMENT (IOC);  Surgeon: Leandrew Koyanagi, MD;  Location: [facility];  Service: Ophthalmology;  Laterality: Left;  . COLONOSCOPY    . FRACTURE SURGERY Left    hip  . history of left hip fracture    . INTRAMEDULLARY (IM) NAIL INTERTROCHANTERIC Left 02/12/2017   Procedure: INTRAMEDULLARY (IM) NAIL INTERTROCHANTRIC;  Surgeon: Earnestine Leys, MD;  Location: [facility];  Service: Orthopedics;  Laterality: Left;  . kidney stent    . OOPHORECTOMY Bilateral 2015  . ORIF WRIST FRACTURE Right 04/08/2018   Procedure: OPEN REDUCTION INTERNAL FIXATION (ORIF) WRIST FRACTURE;  Surgeon: Altamese [place]ston, MD;  Location: [facility];  Service: Orthopedics;  Laterality: Right;  . TONSILLECTOMY    . TOTAL KNEE ARTHROPLASTY Right 08/07/2017   Procedure: TOTAL KNEE ARTHROPLASTY;  Surgeon: Earnestine Leys, MD;  Location: [facility];  Service: Orthopedics;  Laterality: Right;    There were no vitals filed for this visit.  Subjective Assessment - 06/05/18 1327    Subjective   I have seen the surgeon yesterday and do not have to go back - fracture healing well - exercises doing wel     Patient Stated Goals  I want to be able to use my R hand like before to open jars, cut food, lift/grip/ squeeze and hold objects , write better     Currently in Pain?  No/denies         [facility] OT Assessment - 06/05/18 0001      AROM   Right Wrist Extension  45 Degrees    Right Wrist Flexion  72 Degrees    Right Wrist Radial Deviation  20 Degrees    Right Wrist Ulnar Deviation  24 Degrees  AROM cont to improve -and pt did well with 1 lbs weight - 1 sets and now pain       OT Treatments/Exercises (OP) - 06/05/18 0001      RUE Fluidotherapy   Number Minutes Fluidotherapy  8 Minutes    RUE Fluidotherapy Location  Hand;Wrist    Comments  AROM at [facility] to decrease stiffness      assess scar -and scar massage done by OT -and some soft tissue mobs to thumb webspace and MC spreads    Add and review  teal putty for gripping , lat and 3 point grip   10 -12 reps  1 x day  And increase Sunday if no pain - 2 sets Should have no pain - back off when feeling pulll  BTE for CPM 2  X 200 sec for wrist extention -    1 lbs weight  For wrist in all planes  can increase to 2 sets and  Then Sunday if not pain 3 sets  And cont to work on wrist  PROM and scar  tissue         OT Education - 06/05/18 1328    Education Details  HEP review and ugrade strengthening and add putty     Person(s) Educated  Patient;Caregiver(s)    Methods  Demonstration;Handout;Explanation    Comprehension  Returned demonstration;Verbalized understanding       OT Short Term Goals - 05/26/18 1929      OT SHORT TERM GOAL #1   Title  Pt to be independent in HEP to increase wrist AROM , strength  and decrease scar tissue  to improve use of R dominant hand and wrist in ADL's and IADl's     Baseline  no knowledge on HEP     Time  2    Period  Weeks    Status  New    Target Date  06/09/18        OT Long Term Goals - 05/26/18 1929      OT LONG TERM GOAL #1   Title  R wrist AROM improve with 10 to 20 degrees to turn doorknob and use in hygiene with out issues     Baseline  see flowsheet- show decrease AROM in all planes for wrist except pronation - sup is 85 degrees     Time  4    Period  Weeks    Status  New    Target Date  06/23/18      OT LONG TERM GOAL #2   Title  Pt R wrist strength increase for pt to pick up and carry more than 4 lbs without pain or issues to push door open, push up in bed,  and cut with knife     Baseline  no strengthening up to this date yet     Time  5    Period  Weeks    Status  New    Target Date  06/30/18      OT LONG TERM GOAL #3   Title  R grip  increase with 5-8 lbs and prehension with at least 2 lbs to be able to cut with knife , do buttons, open jars with more ease     Baseline  grip and prehension decrease - see flowsheet - 15 Lbs R , L 35 lbs     Time  6    Period  Weeks    Status  New    Target Date  07/07/18      OT LONG TERM GOAL #4   Title  Function score on PRWHE improve with more than 10 points     Baseline  function on PRWHE at eval 19/50     Time  6    Period  Weeks    Status  New    Target Date  07/07/18            Plan - 06/05/18 1329    Clinical Impression Statement  Pt cont to make progress  in ROM and able to upgrade strength this date - denies pain - scar improving - cont to encourage pt to use hand more     Occupational performance deficits (Please refer to evaluation for details):  ADL's;IADL's;Play;Leisure    Rehab Potential  Good    OT Frequency  1x / week   1-2 x wks    OT Duration  6 weeks    OT Treatment/Interventions  Self-care/ADL training;Therapeutic exercise;Splinting;Patient/family education;Paraffin;Fluidtherapy    Plan  progress with upgrade of HEP     Clinical Decision Making  Several treatment options, min-mod task modification necessary    OT Home Exercise Plan  see pt instruction    Consulted and Agree with Plan of Care  Patient       Patient will benefit from skilled therapeutic intervention in order to improve the following deficits and impairments:  Impaired flexibility, Pain, Decreased scar mobility, Impaired UE functional use, Decreased strength  Visit Diagnosis: Stiffness of right wrist, not elsewhere classified  Scar condition and fibrosis of skin  Muscle weakness (generalized)    Problem List Patient Active Problem List   Diagnosis Date Noted  . S/P ORIF (open reduction internal fixation) fracture radius and ulnar fx 04/08/18 04/10/2018  . Distal radius and ulna fracture, right   . History of CVA (cerebrovascular accident)   . Benign essential HTN   . Tachycardia   . Leukocytosis   . Seizures ([place])   . Stroke-like episode ([place]) s/p IV tPA 04/03/2018  . Syncope   . TIA (transient ischemic attack) 11/04/2017  . Malnutrition of moderate degree 08/26/2017  . Total knee replacement status 08/07/2017  . Closed left hip fracture ([place]) 02/12/2017  . HTN (hypertension) 02/12/2017  . HLD (hyperlipidemia) 02/12/2017    Rosalyn Gess OTR/L,CLT 06/05/2018, 8:52 PM  [facility] [address] [place], [address] Phone: [phone]   Fax:  [phone]  Name: Kellie Harris MRN: [id number] Date of Birth: 1931/03/16

## 2018-06-05 NOTE — Patient Instructions (Addendum)
Add teal putty for gripping , lat and 3 point grip   10 -12 reps  1 x day  And increase Sunday if no pain - 2 sets   1 lbs weight can increase to 2 sets and  Then Sunday if not pain 3 sets  And cont to work on wrist  PROM and scar tissue

## 2018-06-09 ENCOUNTER — Ambulatory Visit: Payer: PPO | Admitting: Occupational Therapy

## 2018-06-09 ENCOUNTER — Ambulatory Visit: Payer: PPO

## 2018-06-09 DIAGNOSIS — L905 Scar conditions and fibrosis of skin: Secondary | ICD-10-CM

## 2018-06-09 DIAGNOSIS — M25631 Stiffness of right wrist, not elsewhere classified: Secondary | ICD-10-CM

## 2018-06-09 DIAGNOSIS — M6281 Muscle weakness (generalized): Secondary | ICD-10-CM

## 2018-06-09 NOTE — Patient Instructions (Addendum)
Same HEP but 3 sets with 1 lbs weight and putty And add table slides for 20 reps for  wrist extention  2 x day

## 2018-06-09 NOTE — Therapy (Signed)
[facility] [address] [place], [address] Phone: [phone]   Fax:  [phone]  Occupational Therapy Treatment  Patient Details  Name: Kellie Harris MRN: [id number] Date of Birth: 1931/06/08 Referring Provider (OT): [place]   Encounter Date: 06/09/2018  OT End of Session - 06/09/18 1608    Visit Number  4    Number of Visits  12    Date for OT Re-Evaluation  07/07/18    OT Start Time  1415    OT Stop Time  1501    OT Time Calculation (min)  46 min    Activity Tolerance  Patient tolerated treatment well    Behavior During Therapy  [facility] for tasks assessed/performed       Past Medical History:  Diagnosis Date  . Arthritis    right knee, left shoulder/ OSTEO  . Cancer ([place])    skin   . Degenerative disc disease, lumbar    L4-L5  . Diverticulosis    ITIS  . History of hiatal hernia   . Hydronephrosis    right  . Hypercholesteremia   . Hypertension    CONTROLLED ON MEDS  . IBS (irritable bowel syndrome)   . Knee fracture, right 03/05/2015   DIFFICULTY WITH AMBULATION  . Osteopenia   . Ovarian cyst   . Pancreatic insufficiency   . Wears hearing aid    bilateral    Past Surgical History:  Procedure Laterality Date  . CATARACT EXTRACTION W/PHACO Right 11/02/2015   Procedure: CATARACT EXTRACTION PHACO AND INTRAOCULAR LENS PLACEMENT (IOC);  Surgeon: Leandrew Koyanagi, MD;  Location: [place];  Service: Ophthalmology;  Laterality: Right;  . CATARACT EXTRACTION W/PHACO Left 07/18/2016   Procedure: CATARACT EXTRACTION PHACO AND INTRAOCULAR LENS PLACEMENT (IOC);  Surgeon: Leandrew Koyanagi, MD;  Location: [place];  Service: Ophthalmology;  Laterality: Left;  . COLONOSCOPY    . FRACTURE SURGERY Left    hip  . history of left hip fracture    . INTRAMEDULLARY (IM) NAIL INTERTROCHANTERIC Left 02/12/2017   Procedure: INTRAMEDULLARY (IM) NAIL INTERTROCHANTRIC;  Surgeon: Earnestine Leys, MD;  Location: [facility];  Service: Orthopedics;  Laterality: Left;  . kidney stent    . OOPHORECTOMY Bilateral 2015  . ORIF WRIST FRACTURE Right 04/08/2018   Procedure: OPEN REDUCTION INTERNAL FIXATION (ORIF) WRIST FRACTURE;  Surgeon: Altamese Grosse Tete, MD;  Location: [facility];  Service: Orthopedics;  Laterality: Right;  . TONSILLECTOMY    . TOTAL KNEE ARTHROPLASTY Right 08/07/2017   Procedure: TOTAL KNEE ARTHROPLASTY;  Surgeon: Earnestine Leys, MD;  Location: [facility];  Service: Orthopedics;  Laterality: Right;    There were no vitals filed for this visit.  Subjective Assessment - 06/09/18 1438    Subjective   I can tell difference in my hand - I can open water bottle and squeeze washcloth - did the 1 lbs weight and teal putty     Patient Stated Goals  I want to be able to use my R hand like before to open jars, cut food, lift/grip/ squeeze and hold objects , write better     Currently in Pain?  No/denies         [facility] OT Assessment - 06/09/18 0001      AROM   Right Wrist Extension  48 Degrees      Strength   Right Hand Grip (lbs)  20    Right Hand Lateral Pinch  8 lbs  Right Hand 3 Point Pinch  8 lbs      Assess grip and prehension strength -as well as wrist extention          OT Treatments/Exercises (OP) - 06/09/18 0001      RUE Fluidotherapy   Number Minutes Fluidotherapy  8 Minutes    RUE Fluidotherapy Location  Hand;Wrist    Comments  AROM for wrist in all planes at [facility]       assess scar -and scar massage done by OT -and some soft tissue mobs to thumb webspace and MC spreads    pt to cont with  teal putty for gripping , lat and 3 point grip   10 -12 reps   2 -3 sets Should have no pain - back off when feeling pulll  BTE for CPM 2  X 200 sec for wrist extention - increase to 55 degrees   Pt ed on doing wrist extention - table slides - 20 reps   Attempted 2 lbs but had pain with wrist extention -on dorsal wrist - hold off and cont 1 lbs    1 lbs  weight  For wrist in all planes  3 sets  - 10 reps   And increase functional use - without pain more than 2-3/10        OT Education - 06/09/18 1608    Education Details  Same hEP - but increase to 3 sets of 1 lbs and putty    Person(s) Educated  Patient    Methods  Demonstration;Handout;Explanation    Comprehension  Returned demonstration;Verbalized understanding       OT Short Term Goals - 05/26/18 1929      OT SHORT TERM GOAL #1   Title  Pt to be independent in HEP to increase wrist AROM , strength  and decrease scar tissue  to improve use of R dominant hand and wrist in ADL's and IADl's     Baseline  no knowledge on HEP     Time  2    Period  Weeks    Status  New    Target Date  06/09/18        OT Long Term Goals - 05/26/18 1929      OT LONG TERM GOAL #1   Title  R wrist AROM improve with 10 to 20 degrees to turn doorknob and use in hygiene with out issues     Baseline  see flowsheet- show decrease AROM in all planes for wrist except pronation - sup is 85 degrees     Time  4    Period  Weeks    Status  New    Target Date  06/23/18      OT LONG TERM GOAL #2   Title  Pt R wrist strength increase for pt to pick up and carry more than 4 lbs without pain or issues to push door open, push up in bed,  and cut with knife     Baseline  no strengthening up to this date yet     Time  5    Period  Weeks    Status  New    Target Date  06/30/18      OT LONG TERM GOAL #3   Title  R grip  increase with 5-8 lbs and prehension with at least 2 lbs to be able to cut with knife , do buttons, open jars with more ease     Baseline  grip and  prehension decrease - see flowsheet - 15 Lbs R , L 35 lbs     Time  6    Period  Weeks    Status  New    Target Date  07/07/18      OT LONG TERM GOAL #4   Title  Function score on PRWHE improve with more than 10 points     Baseline  function on PRWHE at eval 19/50     Time  6    Period  Weeks    Status  New    Target Date  07/07/18             Plan - 06/09/18 1609    Clinical Impression Statement  Pt made progress since last time in grip and prehension strength- as well as wrist extention - but still impaired- this date had some dorsal wrist pain with attempt to increase to 2 lbs- ptto cont 1 lbs fur 3 sets and putty same- and cont to increase wrist extention ROM     Occupational performance deficits (Please refer to evaluation for details):  ADL's;IADL's;Play;Leisure    Rehab Potential  Good    OT Frequency  --   1-2 x wk    OT Duration  4 weeks    OT Treatment/Interventions  Self-care/ADL training;Therapeutic exercise;Splinting;Patient/family education;Paraffin;Fluidtherapy    Plan  progress with upgrade of HEP     Clinical Decision Making  Several treatment options, min-mod task modification necessary    OT Home Exercise Plan  see pt instruction    Consulted and Agree with Plan of Care  Patient       Patient will benefit from skilled therapeutic intervention in order to improve the following deficits and impairments:  Impaired flexibility, Pain, Decreased scar mobility, Impaired UE functional use, Decreased strength  Visit Diagnosis: Muscle weakness (generalized)  Stiffness of right wrist, not elsewhere classified  Scar condition and fibrosis of skin    Problem List Patient Active Problem List   Diagnosis Date Noted  . S/P ORIF (open reduction internal fixation) fracture radius and ulnar fx 04/08/18 04/10/2018  . Distal radius and ulna fracture, right   . History of CVA (cerebrovascular accident)   . Benign essential HTN   . Tachycardia   . Leukocytosis   . Seizures ([place])   . Stroke-like episode ([place]) s/p IV tPA 04/03/2018  . Syncope   . TIA (transient ischemic attack) 11/04/2017  . Malnutrition of moderate degree 08/26/2017  . Total knee replacement status 08/07/2017  . Closed left hip fracture ([place]) 02/12/2017  . HTN (hypertension) 02/12/2017  . HLD (hyperlipidemia) 02/12/2017    Rosalyn Gess OTR/L,CLT 06/09/2018, 4:11 PM  [place] [facility] [address] [place], [address] Phone: [phone]   Fax:  [phone]  Name: Kellie Harris MRN: [id number] Date of Birth: 1931/06/08

## 2018-06-12 ENCOUNTER — Ambulatory Visit: Payer: PPO | Admitting: Occupational Therapy

## 2018-06-12 DIAGNOSIS — M25631 Stiffness of right wrist, not elsewhere classified: Secondary | ICD-10-CM | POA: Diagnosis not present

## 2018-06-12 DIAGNOSIS — M6281 Muscle weakness (generalized): Secondary | ICD-10-CM

## 2018-06-12 DIAGNOSIS — L905 Scar conditions and fibrosis of skin: Secondary | ICD-10-CM

## 2018-06-12 NOTE — Therapy (Signed)
[facility] [address], [place], [address] Phone: [phone]   Fax:  [phone]  Occupational Therapy Treatment  Patient Details  Name: Kellie Harris MRN: [id number] Date of Birth: 05-12-1931 Referring Provider (OT): [place]   Encounter Date: 06/12/2018  OT End of Session - 06/12/18 1237    Visit Number  5    Number of Visits  12    Date for OT Re-Evaluation  07/07/18    OT Start Time  3235    OT Stop Time  1302    OT Time Calculation (min)  40 min    Activity Tolerance  Patient tolerated treatment well    Behavior During Therapy  [facility] for tasks assessed/performed       Past Medical History:  Diagnosis Date  . Arthritis    right knee, left shoulder/ OSTEO  . Cancer ([place])    skin   . Degenerative disc disease, lumbar    L4-L5  . Diverticulosis    ITIS  . History of hiatal hernia   . Hydronephrosis    right  . Hypercholesteremia   . Hypertension    CONTROLLED ON MEDS  . IBS (irritable bowel syndrome)   . Knee fracture, right 03/05/2015   DIFFICULTY WITH AMBULATION  . Osteopenia   . Ovarian cyst   . Pancreatic insufficiency   . Wears hearing aid    bilateral    Past Surgical History:  Procedure Laterality Date  . CATARACT EXTRACTION W/PHACO Right 11/02/2015   Procedure: CATARACT EXTRACTION PHACO AND INTRAOCULAR LENS PLACEMENT (IOC);  Surgeon: Leandrew Koyanagi, MD;  Location: [facility];  Service: Ophthalmology;  Laterality: Right;  . CATARACT EXTRACTION W/PHACO Left 07/18/2016   Procedure: CATARACT EXTRACTION PHACO AND INTRAOCULAR LENS PLACEMENT (IOC);  Surgeon: Leandrew Koyanagi, MD;  Location: [place];  Service: Ophthalmology;  Laterality: Left;  . COLONOSCOPY    . FRACTURE SURGERY Left    hip  . history of left hip fracture    . INTRAMEDULLARY (IM) NAIL INTERTROCHANTERIC Left 02/12/2017   Procedure: INTRAMEDULLARY (IM) NAIL INTERTROCHANTRIC;  Surgeon: Earnestine Leys, MD;  Location: [facility];  Service: Orthopedics;  Laterality: Left;  . kidney stent    . OOPHORECTOMY Bilateral 2015  . ORIF WRIST FRACTURE Right 04/08/2018   Procedure: OPEN REDUCTION INTERNAL FIXATION (ORIF) WRIST FRACTURE;  Surgeon: Altamese Wolsey, MD;  Location: [facility];  Service: Orthopedics;  Laterality: Right;  . TONSILLECTOMY    . TOTAL KNEE ARTHROPLASTY Right 08/07/2017   Procedure: TOTAL KNEE ARTHROPLASTY;  Surgeon: Earnestine Leys, MD;  Location: [facility];  Service: Orthopedics;  Laterality: Right;    There were no vitals filed for this visit.  Subjective Assessment - 06/12/18 1235    Subjective   Over weekend used my hand - to bath and dress and fix something light in the kitchen - think the rainy weather bothers my joints     Patient Stated Goals  I want to be able to use my R hand like before to open jars, cut food, lift/grip/ squeeze and hold objects , write better     Currently in Pain?  No/denies         [facility] OT Assessment - 06/12/18 0001      AROM   Right Wrist Extension  54 Degrees      Strength   Right Hand Grip (lbs)  24    Right Hand Lateral Pinch  9 lbs  Right Hand 3 Point Pinch  8 lbs    Left Hand Grip (lbs)  35    Left Hand Lateral Pinch  11 lbs    Left Hand 3 Point Pinch  10 lbs       assess wrist extention and grip /prehension strength -see flowsheet         OT Treatments/Exercises (OP) - 06/12/18 0001      RUE Paraffin   Number Minutes Paraffin  8 Minutes    RUE Paraffin Location  Hand   wrist    Comments  Done 3 x 1 min wrist extention stretches in paraffin -        assess scar -moving great  Done some soft tissue mobs to thumb webspace and MC spreadswith PROM and stretches for wrist flexion , ext, Rd, UD   pt to cont with teal putty for gripping , lat and 3 point grip  10 -12 reps  3 sets Should have no pain - back off when feeling pulll - showing increase grip strength the last 2 x   BTE for CPM 2 X 200 sec for  wrist extention - BTE wrist extention 0 lbs 2 x 80 reps    wrist extention - table slides - 20 reps to do at home- needed min A    2 lbs for wrist RD, UD, sup /pro this date  12 reps pain free - support during SUp/pro - add to HEP    1 lbs weightFor wrist flexion and extention stay same  3 sets  - 10 reps   And increase functional use - without pain more than 2-3/10        OT Education - 06/12/18 1237    Education Details  upgrade RD/UD, sup /pro to 2 lbs weight -     Person(s) Educated  Patient    Methods  Demonstration;Handout;Explanation    Comprehension  Returned demonstration;Verbalized understanding       OT Short Term Goals - 05/26/18 1929      OT SHORT TERM GOAL #1   Title  Pt to be independent in HEP to increase wrist AROM , strength  and decrease scar tissue  to improve use of R dominant hand and wrist in ADL's and IADl's     Baseline  no knowledge on HEP     Time  2    Period  Weeks    Status  New    Target Date  06/09/18        OT Long Term Goals - 05/26/18 1929      OT LONG TERM GOAL #1   Title  R wrist AROM improve with 10 to 20 degrees to turn doorknob and use in hygiene with out issues     Baseline  see flowsheet- show decrease AROM in all planes for wrist except pronation - sup is 85 degrees     Time  4    Period  Weeks    Status  New    Target Date  06/23/18      OT LONG TERM GOAL #2   Title  Pt R wrist strength increase for pt to pick up and carry more than 4 lbs without pain or issues to push door open, push up in bed,  and cut with knife     Baseline  no strengthening up to this date yet     Time  5    Period  Weeks    Status  New  Target Date  06/30/18      OT LONG TERM GOAL #3   Title  R grip  increase with 5-8 lbs and prehension with at least 2 lbs to be able to cut with knife , do buttons, open jars with more ease     Baseline  grip and prehension decrease - see flowsheet - 15 Lbs R , L 35 lbs     Time  6    Period  Weeks     Status  New    Target Date  07/07/18      OT LONG TERM GOAL #4   Title  Function score on PRWHE improve with more than 10 points     Baseline  function on PRWHE at eval 19/50     Time  6    Period  Weeks    Status  New    Target Date  07/07/18            Plan - 06/12/18 1240    Clinical Impression Statement  Pt cont to make progress in grip strength  and AROM for wrist - able to upgrade pt this date to 2 lbs for sup/pro, RD, UD - but wrist ext, flexion stay same for 1 lbs and putty - will upgrade next session     Occupational performance deficits (Please refer to evaluation for details):  ADL's;IADL's;Play;Leisure    Rehab Potential  Good    OT Frequency  --   1-2 x wk   OT Duration  4 weeks    OT Treatment/Interventions  Self-care/ADL training;Therapeutic exercise;Splinting;Patient/family education;Paraffin;Fluidtherapy    Plan  assess progress with 2 lbs and if can upgrade putty     Clinical Decision Making  Several treatment options, min-mod task modification necessary    OT Home Exercise Plan  see pt instruction    Consulted and Agree with Plan of Care  Patient       Patient will benefit from skilled therapeutic intervention in order to improve the following deficits and impairments:  Impaired flexibility, Pain, Decreased scar mobility, Impaired UE functional use, Decreased strength  Visit Diagnosis: Muscle weakness (generalized)  Stiffness of right wrist, not elsewhere classified  Scar condition and fibrosis of skin    Problem List Patient Active Problem List   Diagnosis Date Noted  . S/P ORIF (open reduction internal fixation) fracture radius and ulnar fx 04/08/18 04/10/2018  . Distal radius and ulna fracture, right   . History of CVA (cerebrovascular accident)   . Benign essential HTN   . Tachycardia   . Leukocytosis   . Seizures ([place])   . Stroke-like episode ([place]) s/p IV tPA 04/03/2018  . Syncope   . TIA (transient ischemic attack) 11/04/2017  .  Malnutrition of moderate degree 08/26/2017  . Total knee replacement status 08/07/2017  . Closed left hip fracture ([place]) 02/12/2017  . HTN (hypertension) 02/12/2017  . HLD (hyperlipidemia) 02/12/2017    Rosalyn Gess OTR/L,CLT 06/12/2018, 1:51 PM  [facility] [address], [place], [address] Phone: [phone]   Fax:  [phone]  Name: Kellie Harris MRN: [id number] Date of Birth: October 20, 1930

## 2018-06-12 NOTE — Patient Instructions (Signed)
Increase sup/pro and RD, UD - 2 lbs weight   10 -15 reps  Pain free   ROM , putty and 1 lbs for wrist extention /flexion stay same

## 2018-06-16 ENCOUNTER — Ambulatory Visit: Payer: PPO | Admitting: Occupational Therapy

## 2018-06-16 ENCOUNTER — Encounter: Payer: Self-pay | Admitting: *Deleted

## 2018-06-16 ENCOUNTER — Ambulatory Visit: Payer: PPO | Attending: Internal Medicine

## 2018-06-16 DIAGNOSIS — M6281 Muscle weakness (generalized): Secondary | ICD-10-CM

## 2018-06-16 DIAGNOSIS — L905 Scar conditions and fibrosis of skin: Secondary | ICD-10-CM

## 2018-06-16 DIAGNOSIS — M25631 Stiffness of right wrist, not elsewhere classified: Secondary | ICD-10-CM

## 2018-06-16 DIAGNOSIS — R262 Difficulty in walking, not elsewhere classified: Secondary | ICD-10-CM | POA: Diagnosis not present

## 2018-06-16 DIAGNOSIS — Z9181 History of falling: Secondary | ICD-10-CM | POA: Diagnosis not present

## 2018-06-16 NOTE — Patient Instructions (Signed)
[employer] Access Code: [email or web address]     Side Stepping with Counter Support    10x for 2 sets daily   Standing Hip Abduction with Counter Support   10x2   LAQ 10x2 with 5 second holds

## 2018-06-16 NOTE — Therapy (Signed)
[facility] [address] [place], [address] Phone: [phone]   Fax:  [phone]  Physical Therapy Treatment  Patient Details  Name: Kellie Harris MRN: [id number] Date of Birth: [birth year] Referring Provider (PT): Antony Contras, MD   Encounter Date: 06/16/2018  PT End of Session - 06/16/18 1302    Visit Number  3    Number of Visits  17    Date for PT Re-Evaluation  07/31/18    Authorization Type  3    Authorization Time Period  of 10 progress report    PT Start Time  1302    PT Stop Time  1345    PT Time Calculation (min)  43 min    Equipment Utilized During Treatment  Gait belt    Activity Tolerance  Patient tolerated treatment well    Behavior During Therapy  WFL for tasks assessed/performed       Past Medical History:  Diagnosis Date  . Arthritis    right knee, left shoulder/ OSTEO  . Cancer ([place])    skin   . Degenerative disc disease, lumbar    L4-L5  . Diverticulosis    ITIS  . History of hiatal hernia   . Hydronephrosis    right  . Hypercholesteremia   . Hypertension    CONTROLLED ON MEDS  . IBS (irritable bowel syndrome)   . Knee fracture, right 03/05/2015   DIFFICULTY WITH AMBULATION  . Osteopenia   . Ovarian cyst   . Pancreatic insufficiency   . Wears hearing aid    bilateral    Past Surgical History:  Procedure Laterality Date  . CATARACT EXTRACTION W/PHACO Right 11/02/2015   Procedure: CATARACT EXTRACTION PHACO AND INTRAOCULAR LENS PLACEMENT (IOC);  Surgeon: Leandrew Koyanagi, MD;  Location: [facility];  Service: Ophthalmology;  Laterality: Right;  . CATARACT EXTRACTION W/PHACO Left 07/18/2016   Procedure: CATARACT EXTRACTION PHACO AND INTRAOCULAR LENS PLACEMENT (IOC);  Surgeon: Leandrew Koyanagi, MD;  Location: [facility];  Service: Ophthalmology;  Laterality: Left;  . COLONOSCOPY    . FRACTURE SURGERY Left    hip  . history of left hip fracture    .  INTRAMEDULLARY (IM) NAIL INTERTROCHANTERIC Left 02/12/2017   Procedure: INTRAMEDULLARY (IM) NAIL INTERTROCHANTRIC;  Surgeon: Earnestine Leys, MD;  Location: [facility];  Service: Orthopedics;  Laterality: Left;  . kidney stent    . OOPHORECTOMY Bilateral 2015  . ORIF WRIST FRACTURE Right 04/08/2018   Procedure: OPEN REDUCTION INTERNAL FIXATION (ORIF) WRIST FRACTURE;  Surgeon: Altamese Staples, MD;  Location: [facility];  Service: Orthopedics;  Laterality: Right;  . TONSILLECTOMY    . TOTAL KNEE ARTHROPLASTY Right 08/07/2017   Procedure: TOTAL KNEE ARTHROPLASTY;  Surgeon: Earnestine Leys, MD;  Location: [facility];  Service: Orthopedics;  Laterality: Right;    There were no vitals filed for this visit.  Subjective Assessment - 06/16/18 1302    Subjective  Pt states she hopes that she is getting better. Legs feel weak when she stands for a while. Does not know how long. Usually when she sits down she is ok.  Also feels weak 30 minutes after taking Plavix     Pertinent History  Pt states being referred to PT for balance and gait. Gait and balance has been going dowhnill since December 2018 after her R TKA.  [place] after being discharged from skilled nursing facility and fractured the head of her L femur. Did not have surgery due  to the fracture not being bad. Had a sycope episode March 2019. She was out all of a sudden. Had a total stroke work up and was within normal limits.  Was coming along alright. However, had another syncopal episode 04/03/2018. Pt was standing and talking to her neighbor. Does not remember the ambulance coming. She seemed like she had a stroke. Had a second stroke work up and the results were within normal limits. Took her longer to recover from her syncopal episode.  Currently taking plavix.  Currently taking the medication Kepra for seizzures. Pt states that when the episodes occur, there is no warning.   Had R wrist fracture ORIF on 04/08/2018.  Pt currently NWB R wrist.   Mainly uses her  hurricain SPC.  Furniture walk at home. Also has a 3 wheeler and 4 wheeler rollator.  No falls other than due to her syncopal episodes. Pt states CPR can be performed if needed. Nothing long term. Can call ambulance if pt passes out or has a seizure. Neighbor was on her L side when talking to her prior to passing out.       Patient Stated Goals  Be able to walk better.    Currently in Pain?  Yes    Pain Score  --   shoulders, no pain level provided   Pain Onset  More than a month ago                               PT Education - 06/16/18 1335    Education Details  ther-ex, HEP    Person(s) Educated  Patient    Methods  Explanation;Demonstration;Tactile cues;Verbal cues;Handout    Comprehension  Returned demonstration;Verbalized understanding        Objectives  Medbridge Access Code: [email or web address]    Ther-ex   Blood pressure L arm sitting, mechanically taken, normal cuff: 147/80, HR 78  SpO2: 97% room air  Side stepping with B UE assist 10x 5 ft to the R and 5 ft to the L  Standing hip abduction with B UE assist to promote glute med muscle use             R 10x2             L 10x2  LAQ              L 10x5 seconds for 2 sets              R 10x5 seconds for 2 sets  Stepping over 2 fallen canes 8x each side with SPC assist  Forward step up onto and over Air Ex pad with one UE assist 5x each LE  Pt states eyes felt a little fuzzy and legs felt weak after the 3rd repetition. Feels better when sitting down. Exercise was not that difficult  Blood pressure L arm sitting, mechanically taken: 156/75, HR 72   Standing heel toe raises with B UE assist 10x2 each way  Four square step exercise without UE assist, CGA 3x, use of SPC at 3rd rep secondary to knee pain.   Sit <> stand throughout session with emphasis on femoral control.    Improved exercise technique, movement at target joints, use of target muscles after min to mod verbal, visual, tactile cues.    Continued working on LE strength to promote ability to support herself when performing standing tasks. Slight fuzzy vision and weak feeling in LE after  performing step ups onto and over Air Ex pad which disappeared after sitting. BP afterwards L arm sitting 156/75, HR 72 which is within range for physical activity. Added four square step exercise to promote balance when stepping in different directions.        PT Short Term Goals - 06/03/18 1858      PT SHORT TERM GOAL #1   Title  Patient will be independent with her HEP to promote balance and decrease fall risk.     Time  3    Period  Weeks    Status  New    Target Date  06/26/18        PT Long Term Goals - 06/03/18 1858      PT LONG TERM GOAL #1   Title  Patient will improve B LE strength by at least 1/2 MMT grade to promote ability to perform standing tasks with decreased fall risk.     Time  8    Period  Weeks    Status  New    Target Date  07/31/18      PT LONG TERM GOAL #2   Title  Patient will improve TUG time to 12 seconds or less with use of SPC to promote functional mobility and decrease fall risk.     Baseline  18.4 seconds on average with SPC on L (06/03/2018)    Time  8    Period  Weeks    Status  New    Target Date  07/31/18      PT LONG TERM GOAL #3   Title  Patient will improve FOTO score by at least 10 points as a demonstration of improved function.     Baseline  Lower Leg FOTO 40 (06/03/2018)    Time  8    Period  Weeks    Status  New    Target Date  07/31/18      PT LONG TERM GOAL #4   Title  Patient will be able to ambulate 100 ft or more with SBA to independent to promote mobility.     Baseline  Pt currently ambulates with SPC (06/03/2018)    Time  8    Period  Weeks    Status  New    Target Date  07/31/18            Plan - 06/16/18 1257    Clinical Impression Statement  Continued working on LE strength to promote ability to support herself when performing standing tasks. Slight  fuzzy vision and weak feeling in LE after performing step ups onto and over Air Ex pad which disappeared after sitting. BP afterwards L arm sitting 156/75, HR 72 which is within range for physical activity. Added four square step exercise to promote balance when stepping in different directions.     Rehab Potential  Fair    Clinical Impairments Affecting Rehab Potential  (-) age, hx of falls, weakness, medical history; (+) motivated    PT Frequency  2x / week    PT Duration  8 weeks    PT Treatment/Interventions  Therapeutic activities;Therapeutic exercise;Neuromuscular re-education;Balance training;Patient/family education;Manual techniques;Masaki needling;Aquatic Therapy;Electrical Stimulation;Iontophoresis 4mg /ml Dexamethasone;Gait training    PT Next Visit Plan  trunk, B hip and LE strengthening, balance, gait, manual techniques and modalities PRN.     Consulted and Agree with Plan of Care  Patient       Patient will benefit from skilled therapeutic intervention in order to improve the following  deficits and impairments:  Postural dysfunction, Improper body mechanics, Difficulty walking, Decreased strength, Decreased balance, Abnormal gait  Visit Diagnosis: Muscle weakness (generalized)  History of falling  Difficulty in walking, not elsewhere classified     Problem List Patient Active Problem List   Diagnosis Date Noted  . S/P ORIF (open reduction internal fixation) fracture radius and ulnar fx 04/08/18 04/10/2018  . Distal radius and ulna fracture, right   . History of CVA (cerebrovascular accident)   . Benign essential HTN   . Tachycardia   . Leukocytosis   . Seizures ([place])   . Stroke-like episode ([place]) s/p IV tPA 04/03/2018  . Syncope   . TIA (transient ischemic attack) 11/04/2017  . Malnutrition of moderate degree 08/26/2017  . Total knee replacement status 08/07/2017  . Closed left hip fracture ([place]) 02/12/2017  . HTN (hypertension) 02/12/2017  . HLD (hyperlipidemia)  02/12/2017    Joneen Boers PT, DPT   06/16/2018, 2:10 PM   [facility] [address] [place], [address] Phone: [phone]   Fax:  [phone]  Name: Kellie Harris MRN: [id number] Date of Birth: [birth year]

## 2018-06-16 NOTE — Therapy (Signed)
[facility] [address] [place], [address] Phone: [phone]   Fax:  [phone]  Occupational Therapy Treatment  Patient Details  Name: Kellie Harris MRN: [id number] Date of Birth: 1931-06-26 Referring Provider (OT): [place]   Encounter Date: 06/16/2018  OT End of Session - 06/16/18 1800    Visit Number  6    Number of Visits  12    Date for OT Re-Evaluation  07/07/18    OT Start Time  1208    OT Stop Time  1252    OT Time Calculation (min)  44 min    Activity Tolerance  Patient tolerated treatment well    Behavior During Therapy  [facility] for tasks assessed/performed       Past Medical History:  Diagnosis Date  . Arthritis    right knee, left shoulder/ OSTEO  . Cancer ([place])    skin   . Degenerative disc disease, lumbar    L4-L5  . Diverticulosis    ITIS  . History of hiatal hernia   . Hydronephrosis    right  . Hypercholesteremia   . Hypertension    CONTROLLED ON MEDS  . IBS (irritable bowel syndrome)   . Knee fracture, right 03/05/2015   DIFFICULTY WITH AMBULATION  . Osteopenia   . Ovarian cyst   . Pancreatic insufficiency   . Wears hearing aid    bilateral    Past Surgical History:  Procedure Laterality Date  . CATARACT EXTRACTION W/PHACO Right 11/02/2015   Procedure: CATARACT EXTRACTION PHACO AND INTRAOCULAR LENS PLACEMENT (IOC);  Surgeon: Leandrew Koyanagi, MD;  Location: [facility];  Service: Ophthalmology;  Laterality: Right;  . CATARACT EXTRACTION W/PHACO Left 07/18/2016   Procedure: CATARACT EXTRACTION PHACO AND INTRAOCULAR LENS PLACEMENT (IOC);  Surgeon: Leandrew Koyanagi, MD;  Location: [facility];  Service: Ophthalmology;  Laterality: Left;  . COLONOSCOPY    . FRACTURE SURGERY Left    hip  . history of left hip fracture    . INTRAMEDULLARY (IM) NAIL INTERTROCHANTERIC Left 02/12/2017   Procedure: INTRAMEDULLARY (IM) NAIL INTERTROCHANTRIC;  Surgeon: Earnestine Leys, MD;  Location: [facility];  Service: Orthopedics;  Laterality: Left;  . kidney stent    . OOPHORECTOMY Bilateral 2015  . ORIF WRIST FRACTURE Right 04/08/2018   Procedure: OPEN REDUCTION INTERNAL FIXATION (ORIF) WRIST FRACTURE;  Surgeon: Altamese Tucumcari, MD;  Location: [facility];  Service: Orthopedics;  Laterality: Right;  . TONSILLECTOMY    . TOTAL KNEE ARTHROPLASTY Right 08/07/2017   Procedure: TOTAL KNEE ARTHROPLASTY;  Surgeon: Earnestine Leys, MD;  Location: [facility];  Service: Orthopedics;  Laterality: Right;    There were no vitals filed for this visit.  Subjective Assessment - 06/16/18 1745    Subjective   I am using my hand more - making my bed, bathing and dressing , some light stuff in kitchen - can help lift 1/2 gallon -but no just that hand - and cannot open jars     Patient Stated Goals  I want to be able to use my R hand like before to open jars, cut food, lift/grip/ squeeze and hold objects , write better     Currently in Pain?  Yes    Pain Score  1     Pain Location  Wrist    Pain Orientation  Right    Pain Descriptors / Indicators  Aching    Pain Type  Surgical pain    Pain Onset  More than a month ago                   OT Treatments/Exercises (OP) - 06/16/18 0001      RUE Paraffin   Number Minutes Paraffin  8 Minutes    RUE Paraffin Location  Hand   wrist   Comments  prior to ROM      assess scar -moving great  Done some soft tissue mobs to thumb webspace and MC spreadswith PROM and stretches for wrist flexion , ext, Rd, UD   pt to cont withteal putty for gripping , lat and 3 point grip  10 -12 reps 3 sets Should have no pain    BTE for CPM 2 X 200 sec for wrist extention -   wrist extention - table slides - 20 reps to do at home- needed min A    2 lbs for wrist RD, UD, sup /pro and wrist flexion /ext  this date  12 reps pain free - support during SUp/pro   pt to work on 2 sets for 3-4 day   then increase to 3 sets     And  increase functional use - without pain more than 2-3/10    Pt for the next week - increase to 2 sets now for wrist 3-4 days and then 3 sets   for wrist in all planes  but can support sup/pro - because of some shoulder discomfort  And teal putty - pt to work on 2 sets of grip , lat and 3 point   then in 3-4 days   increase to 3 sets  Cont with ROM - mostly wrist extention PROM       OT Education - 06/16/18 1800    Education Details  HEP increase to 2 and 3 sets for putty and 2 lbs weight- pain free     Person(s) Educated  Patient    Methods  Demonstration;Handout;Explanation    Comprehension  Returned demonstration;Verbalized understanding       OT Short Term Goals - 05/26/18 1929      OT SHORT TERM GOAL #1   Title  Pt to be independent in HEP to increase wrist AROM , strength  and decrease scar tissue  to improve use of R dominant hand and wrist in ADL's and IADl's     Baseline  no knowledge on HEP     Time  2    Period  Weeks    Status  New    Target Date  06/09/18        OT Long Term Goals - 05/26/18 1929      OT LONG TERM GOAL #1   Title  R wrist AROM improve with 10 to 20 degrees to turn doorknob and use in hygiene with out issues     Baseline  see flowsheet- show decrease AROM in all planes for wrist except pronation - sup is 85 degrees     Time  4    Period  Weeks    Status  New    Target Date  06/23/18      OT LONG TERM GOAL #2   Title  Pt R wrist strength increase for pt to pick up and carry more than 4 lbs without pain or issues to push door open, push up in bed,  and cut with knife     Baseline  no strengthening up to this date yet     Time  5  Period  Weeks    Status  New    Target Date  06/30/18      OT LONG TERM GOAL #3   Title  R grip  increase with 5-8 lbs and prehension with at least 2 lbs to be able to cut with knife , do buttons, open jars with more ease     Baseline  grip and prehension decrease - see flowsheet - 15 Lbs R , L 35 lbs      Time  6    Period  Weeks    Status  New    Target Date  07/07/18      OT LONG TERM GOAL #4   Title  Function score on PRWHE improve with more than 10 points     Baseline  function on PRWHE at eval 19/50     Time  6    Period  Weeks    Status  New    Target Date  07/07/18            Plan - 06/16/18 1801    Clinical Impression Statement  Pt making progress in functional use of R hand - and increase strength able to tolerate 2 lbs for wrist in all planes -and pt to work on increasing sets and act tolerance with 2 lbs and putty - decrease to 1 x this week     Occupational performance deficits (Please refer to evaluation for details):  ADL's;IADL's;Play;Leisure    Rehab Potential  Good    OT Frequency  1x / week    OT Duration  4 weeks    OT Treatment/Interventions  Self-care/ADL training;Therapeutic exercise;Splinting;Patient/family education;Paraffin;Fluidtherapy    Plan  assess progress with 2 lbs   and putty and can do 3 sets     Clinical Decision Making  Several treatment options, min-mod task modification necessary    OT Home Exercise Plan  see pt instruction    Consulted and Agree with Plan of Care  Patient       Patient will benefit from skilled therapeutic intervention in order to improve the following deficits and impairments:  Impaired flexibility, Pain, Decreased scar mobility, Impaired UE functional use, Decreased strength  Visit Diagnosis: Muscle weakness (generalized)  Stiffness of right wrist, not elsewhere classified  Scar condition and fibrosis of skin    Problem List Patient Active Problem List   Diagnosis Date Noted  . S/P ORIF (open reduction internal fixation) fracture radius and ulnar fx 04/08/18 04/10/2018  . Distal radius and ulna fracture, right   . History of CVA (cerebrovascular accident)   . Benign essential HTN   . Tachycardia   . Leukocytosis   . Seizures ([place])   . Stroke-like episode ([place]) s/p IV tPA 04/03/2018  . Syncope   . TIA  (transient ischemic attack) 11/04/2017  . Malnutrition of moderate degree 08/26/2017  . Total knee replacement status 08/07/2017  . Closed left hip fracture ([place]) 02/12/2017  . HTN (hypertension) 02/12/2017  . HLD (hyperlipidemia) 02/12/2017    Rosalyn Gess OTR/L,CLT 06/16/2018, 6:03 PM  [facility] [address], [place], [address] Phone: [phone]   Fax:  [phone]  Name: Kellie Harris MRN: [id number] Date of Birth: [birth year]

## 2018-06-16 NOTE — Patient Instructions (Signed)
Pt for the next week - increase to 2 sets now for wrist 3-4 days and then 3 sets   for wrist in all planes  but can support sup/pro - because of some shoulder discomfort  And teal putty - pt to work on 2 sets of grip , lat and 3 point   then in 3-4 days   increase to 3 sets  Cont with ROM - mostly wrist extention PROM

## 2018-06-17 NOTE — Telephone Encounter (Signed)
This encounter was created in error - please disregard.

## 2018-06-18 ENCOUNTER — Ambulatory Visit: Payer: PPO

## 2018-06-18 ENCOUNTER — Ambulatory Visit: Payer: PPO | Admitting: Occupational Therapy

## 2018-06-18 DIAGNOSIS — M6281 Muscle weakness (generalized): Secondary | ICD-10-CM

## 2018-06-18 DIAGNOSIS — R262 Difficulty in walking, not elsewhere classified: Secondary | ICD-10-CM

## 2018-06-18 DIAGNOSIS — Z9181 History of falling: Secondary | ICD-10-CM

## 2018-06-18 NOTE — Therapy (Signed)
[facility] [address], [place], [address] Phone: [phone]   Fax:  [phone]  Physical Therapy Treatment  Patient Details  Name: Kellie Harris MRN: [id number] Date of Birth: 03-26-1931 Referring Provider (PT): Antony Contras, MD   Encounter Date: 06/18/2018  PT End of Session - 06/18/18 1517    Visit Number  4    Number of Visits  17    Date for PT Re-Evaluation  07/31/18    Authorization Type  4    Authorization Time Period  of 10 progress report    PT Start Time  1517    PT Stop Time  1601    PT Time Calculation (min)  44 min    Equipment Utilized During Treatment  Gait belt    Activity Tolerance  Patient tolerated treatment well    Behavior During Therapy  WFL for tasks assessed/performed       Past Medical History:  Diagnosis Date  . Arthritis    right knee, left shoulder/ OSTEO  . Cancer ([place])    skin   . Degenerative disc disease, lumbar    L4-L5  . Diverticulosis    ITIS  . History of hiatal hernia   . Hydronephrosis    right  . Hypercholesteremia   . Hypertension    CONTROLLED ON MEDS  . IBS (irritable bowel syndrome)   . Knee fracture, right 03/05/2015   DIFFICULTY WITH AMBULATION  . Osteopenia   . Ovarian cyst   . Pancreatic insufficiency   . Wears hearing aid    bilateral    Past Surgical History:  Procedure Laterality Date  . CATARACT EXTRACTION W/PHACO Right 11/02/2015   Procedure: CATARACT EXTRACTION PHACO AND INTRAOCULAR LENS PLACEMENT (IOC);  Surgeon: Leandrew Koyanagi, MD;  Location: [facility];  Service: Ophthalmology;  Laterality: Right;  . CATARACT EXTRACTION W/PHACO Left 07/18/2016   Procedure: CATARACT EXTRACTION PHACO AND INTRAOCULAR LENS PLACEMENT (IOC);  Surgeon: Leandrew Koyanagi, MD;  Location: [facility];  Service: Ophthalmology;  Laterality: Left;  . COLONOSCOPY    . FRACTURE SURGERY Left    hip  . history of left hip fracture    .  INTRAMEDULLARY (IM) NAIL INTERTROCHANTERIC Left 02/12/2017   Procedure: INTRAMEDULLARY (IM) NAIL INTERTROCHANTRIC;  Surgeon: Earnestine Leys, MD;  Location: [facility];  Service: Orthopedics;  Laterality: Left;  . kidney stent    . OOPHORECTOMY Bilateral 2015  . ORIF WRIST FRACTURE Right 04/08/2018   Procedure: OPEN REDUCTION INTERNAL FIXATION (ORIF) WRIST FRACTURE;  Surgeon: Altamese Truesdale, MD;  Location: [facility];  Service: Orthopedics;  Laterality: Right;  . TONSILLECTOMY    . TOTAL KNEE ARTHROPLASTY Right 08/07/2017   Procedure: TOTAL KNEE ARTHROPLASTY;  Surgeon: Earnestine Leys, MD;  Location: [facility];  Service: Orthopedics;  Laterality: Right;    There were no vitals filed for this visit.  Subjective Assessment - 06/18/18 1519    Subjective  Felt lousy and weak yesterday. No energy. Ate food yesterday. Does not really eat a lot. Ate granola bar, and cereal for breakfast. Yogurt and cottage cheese at lunch. Her GI system does not aggree with large meals.  Feels better today. Not great but better.  Most recent time in rehab, her potassium dropped due to the lisinopril.     Pertinent History  Pt states being referred to PT for balance and gait. Gait and balance has been going dowhnill since December 2018 after her R TKA.  [facility] after  being discharged from skilled nursing facility and fractured the head of her L femur. Did not have surgery due to the fracture not being bad. Had a sycope episode March 2019. She was out all of a sudden. Had a total stroke work up and was within normal limits.  Was coming along alright. However, had another syncopal episode 04/03/2018. Pt was standing and talking to her neighbor. Does not remember the ambulance coming. She seemed like she had a stroke. Had a second stroke work up and the results were within normal limits. Took her longer to recover from her syncopal episode.  Currently taking plavix.  Currently taking the medication Kepra for seizzures. Pt states that when the  episodes occur, there is no warning.   Had R wrist fracture ORIF on 04/08/2018.  Pt currently NWB R wrist.   Mainly uses her hurricain SPC.  Furniture walk at home. Also has a 3 wheeler and 4 wheeler rollator.  No falls other than due to her syncopal episodes. Pt states CPR can be performed if needed. Nothing long term. Can call ambulance if pt passes out or has a seizure. Neighbor was on her L side when talking to her prior to passing out.       Patient Stated Goals  Be able to walk better.    Currently in Pain?  Other (Comment)   no pain level provided.    Pain Onset  More than a month ago                               PT Education - 06/18/18 1720    Education Details  ther-ex    Person(s) Educated  Patient    Methods  Explanation;Demonstration;Tactile cues;Verbal cues    Comprehension  Returned demonstration;Verbalized understanding         Objectives  Medbridge Access Code: [email or web address]    Ther-ex   Blood pressure L arm sitting, mechanically taken, normal cuff 138/77, HR 70  98% SpO2 room air  Sit <> stand from regular chair with emphasis on femoral control   5x  Seated B ankle DF/PF 10x3 each way  Seated manually resisted clamshell isometrics 10x5 seconds for 3 sets   Stepping over 2 fallen canes 8x each side with SPC assist 8x  Decreased trunk and pelvic control observed  Sitting with upright posture  Gentle manual trunk perturbation from PT  30 seconds x 3   Manually resisted trunk flexion isometrics 10x5 seconds for 3 sets  Decreased L anterior thigh discomfort  Standing alterating toe taps onto treadmill platform with B UE assist 10x3 each LE, emphasis on level pelvis. Glute muscle use felt.    Improved exercise technique, movement at target joints, use of target muscles after mod verbal, visual, tactile cues.    No increase in feeling of weakness or "lousiness" after session. Decreased L anterior thigh discomfort with trunk muscle  exercises. Continued working on B quad, glute med and trunk strengthening to promote ability to perform standing tasks with improved lumbopelvic control as well as to help decrease fall risk. Pt tolerated session well without aggravation of symptoms.               PT Short Term Goals - 06/03/18 1858      PT SHORT TERM GOAL #1   Title  Patient will be independent with her HEP to promote balance and decrease fall risk.     Time  3    Period  Weeks    Status  New    Target Date  06/26/18        PT Long Term Goals - 06/03/18 1858      PT LONG TERM GOAL #1   Title  Patient will improve B LE strength by at least 1/2 MMT grade to promote ability to perform standing tasks with decreased fall risk.     Time  8    Period  Weeks    Status  New    Target Date  07/31/18      PT LONG TERM GOAL #2   Title  Patient will improve TUG time to 12 seconds or less with use of SPC to promote functional mobility and decrease fall risk.     Baseline  18.4 seconds on average with SPC on L (06/03/2018)    Time  8    Period  Weeks    Status  New    Target Date  07/31/18      PT LONG TERM GOAL #3   Title  Patient will improve FOTO score by at least 10 points as a demonstration of improved function.     Baseline  Lower Leg FOTO 40 (06/03/2018)    Time  8    Period  Weeks    Status  New    Target Date  07/31/18      PT LONG TERM GOAL #4   Title  Patient will be able to ambulate 100 ft or more with SBA to independent to promote mobility.     Baseline  Pt currently ambulates with SPC (06/03/2018)    Time  8    Period  Weeks    Status  New    Target Date  07/31/18            Plan - 06/18/18 1510    Clinical Impression Statement  No increase in feeling of weakness or "lousiness" after session. Decreased L anterior thigh discomfort with trunk muscle exercises. Continued working on B quad, glute med and trunk strengthening to promote ability to perform standing tasks with improved  lumbopelvic control as well as to help decrease fall risk. Pt tolerated session well without aggravation of symptoms.     Rehab Potential  Fair    Clinical Impairments Affecting Rehab Potential  (-) age, hx of falls, weakness, medical history; (+) motivated    PT Frequency  2x / week    PT Duration  8 weeks    PT Treatment/Interventions  Therapeutic activities;Therapeutic exercise;Neuromuscular re-education;Balance training;Patient/family education;Manual techniques;Schreurs needling;Aquatic Therapy;Electrical Stimulation;Iontophoresis 4mg /ml Dexamethasone;Gait training    PT Next Visit Plan  trunk, B hip and LE strengthening, balance, gait, manual techniques and modalities PRN.     Consulted and Agree with Plan of Care  Patient       Patient will benefit from skilled therapeutic intervention in order to improve the following deficits and impairments:  Postural dysfunction, Improper body mechanics, Difficulty walking, Decreased strength, Decreased balance, Abnormal gait  Visit Diagnosis: Muscle weakness (generalized)  History of falling  Difficulty in walking, not elsewhere classified     Problem List Patient Active Problem List   Diagnosis Date Noted  . S/P ORIF (open reduction internal fixation) fracture radius and ulnar fx 04/08/18 04/10/2018  . Distal radius and ulna fracture, right   . History of CVA (cerebrovascular accident)   . Benign essential HTN   . Tachycardia   . Leukocytosis   . Seizures (  [place])   . Stroke-like episode ([place]) s/p IV tPA 04/03/2018  . Syncope   . TIA (transient ischemic attack) 11/04/2017  . Malnutrition of moderate degree 08/26/2017  . Total knee replacement status 08/07/2017  . Closed left hip fracture ([place]) 02/12/2017  . HTN (hypertension) 02/12/2017  . HLD (hyperlipidemia) 02/12/2017   Joneen Boers PT, DPT   06/18/2018, 5:26 PM  [facility] [address] [place],  [address] Phone: [phone]   Fax:  [phone]  Name: Kellie Harris MRN: [id number] Date of Birth: [birth year]

## 2018-06-23 ENCOUNTER — Other Ambulatory Visit: Payer: Self-pay

## 2018-06-23 ENCOUNTER — Ambulatory Visit: Payer: PPO

## 2018-06-23 DIAGNOSIS — M6281 Muscle weakness (generalized): Secondary | ICD-10-CM

## 2018-06-23 DIAGNOSIS — Z9181 History of falling: Secondary | ICD-10-CM

## 2018-06-23 DIAGNOSIS — R262 Difficulty in walking, not elsewhere classified: Secondary | ICD-10-CM

## 2018-06-23 NOTE — Patient Outreach (Signed)
Telephone outreach to patient to obtain mRs was successfully completed. mRs= 3. 

## 2018-06-23 NOTE — Therapy (Signed)
[facility] [address], [place], [address] Phone: [phone]   Fax:  [phone]  Physical Therapy Treatment  Patient Details  Name: Kellie Harris MRN: [id number] Date of Birth: 10/11/1930 Referring Provider (PT): Antony Contras, MD   Encounter Date: 06/23/2018  PT End of Session - 06/23/18 1307    Visit Number  5    Number of Visits  17    Date for PT Re-Evaluation  07/31/18    Authorization Type  5    Authorization Time Period  of 10 progress report    PT Start Time  1307    PT Stop Time  1347    PT Time Calculation (min)  40 min    Equipment Utilized During Treatment  Gait belt    Activity Tolerance  Patient tolerated treatment well    Behavior During Therapy  [facility] for tasks assessed/performed       Past Medical History:  Diagnosis Date  . Arthritis    right knee, left shoulder/ OSTEO  . Cancer ([place])    skin   . Degenerative disc disease, lumbar    L4-L5  . Diverticulosis    ITIS  . History of hiatal hernia   . Hydronephrosis    right  . Hypercholesteremia   . Hypertension    CONTROLLED ON MEDS  . IBS (irritable bowel syndrome)   . Knee fracture, right 03/05/2015   DIFFICULTY WITH AMBULATION  . Osteopenia   . Ovarian cyst   . Pancreatic insufficiency   . Wears hearing aid    bilateral    Past Surgical History:  Procedure Laterality Date  . CATARACT EXTRACTION W/PHACO Right 11/02/2015   Procedure: CATARACT EXTRACTION PHACO AND INTRAOCULAR LENS PLACEMENT (IOC);  Surgeon: Leandrew Koyanagi, MD;  Location: [facility];  Service: Ophthalmology;  Laterality: Right;  . CATARACT EXTRACTION W/PHACO Left 07/18/2016   Procedure: CATARACT EXTRACTION PHACO AND INTRAOCULAR LENS PLACEMENT (IOC);  Surgeon: Leandrew Koyanagi, MD;  Location: [name];  Service: Ophthalmology;  Laterality: Left;  . COLONOSCOPY    . FRACTURE SURGERY Left    hip  . history of left hip fracture     . INTRAMEDULLARY (IM) NAIL INTERTROCHANTERIC Left 02/12/2017   Procedure: INTRAMEDULLARY (IM) NAIL INTERTROCHANTRIC;  Surgeon: Earnestine Leys, MD;  Location: [facility];  Service: Orthopedics;  Laterality: Left;  . kidney stent    . OOPHORECTOMY Bilateral 2015  . ORIF WRIST FRACTURE Right 04/08/2018   Procedure: OPEN REDUCTION INTERNAL FIXATION (ORIF) WRIST FRACTURE;  Surgeon: Altamese Garland, MD;  Location: [facility];  Service: Orthopedics;  Laterality: Right;  . TONSILLECTOMY    . TOTAL KNEE ARTHROPLASTY Right 08/07/2017   Procedure: TOTAL KNEE ARTHROPLASTY;  Surgeon: Earnestine Leys, MD;  Location: [facility];  Service: Orthopedics;  Laterality: Right;    There were no vitals filed for this visit.  Subjective Assessment - 06/23/18 1309    Subjective  Feels weak in general today. Makes her concerned about her standing and balance. Ate a granola bar, cereal, and a slice of bread at 10 am. Had some cottage cheese before coming here. Did not feel lousy after last session.     Pertinent History  Pt states being referred to PT for balance and gait. Gait and balance has been going dowhnill since December 2018 after her R TKA.  [facility] after being discharged from skilled nursing facility and fractured the head of her L femur. Did not have surgery due  to the fracture not being bad. Had a sycope episode March 2019. She was out all of a sudden. Had a total stroke work up and was within normal limits.  Was coming along alright. However, had another syncopal episode 04/03/2018. Pt was standing and talking to her neighbor. Does not remember the ambulance coming. She seemed like she had a stroke. Had a second stroke work up and the results were within normal limits. Took her longer to recover from her syncopal episode.  Currently taking plavix.  Currently taking the medication Kepra for seizzures. Pt states that when the episodes occur, there is no warning.   Had R wrist fracture ORIF on 04/08/2018.  Pt currently NWB R wrist.    Mainly uses her hurricain SPC.  Furniture walk at home. Also has a 3 wheeler and 4 wheeler rollator.  No falls other than due to her syncopal episodes. Pt states CPR can be performed if needed. Nothing long term. Can call ambulance if pt passes out or has a seizure. Neighbor was on her L side when talking to her prior to passing out.       Patient Stated Goals  Be able to walk better.    Currently in Pain?  Other (Comment)   no complain of pain   Pain Onset  More than a month ago                               PT Education - 06/23/18 1313    Education Details  ther-ex    [employer]) Educated  Patient    Methods  Explanation;Demonstration;Tactile cues;Verbal cues    Comprehension  Verbalized understanding;Returned demonstration      Objectives  No latex band allergies   MedbridgeAccess Code: [email or web address]   Ther-ex    Seated B ankle DF/PF 10x3 each way  LAQ  L 10x5 seconds for 2 sets R 10x5 seconds for 2 sets  Seated clam shells, hips less than 90 degrees flexion   Yellow 10x2, towel padding   Pt later states that her weakness feeling seem to coincide with the days when her vision is not as clear   Blood pressure L arm sitting, mechanically taken, normal cuff: 139/62, HR 70, SpO2 98% room air    Sitting with upright posture             Gentle manual trunk perturbation from PT             30 seconds x 3              Manually resisted trunk flexion isometrics 10x5 seconds for 3 sets          seated B scapular retraction 10x5 seconds  Sit <> stand with emphasis on femoral control 5x  R knee discomfort around patellar area (TKA side)   Improved exercise technique, movement at target joints, use of target muscles after mod verbal, visual, tactile cues.   Worked on LE strengthening to promote ability to support herself when performing standing tasks. Mainly performed strengthening exercises in sitting secondary to pt  stating feeling general weakness. Rest breaks provided as appropriate. Pt states vision is a little better, and strength feels a little better as well after session.    PT Short Term Goals - 06/03/18 1858      PT SHORT TERM GOAL #1   Title  Patient will be independent with her HEP to promote balance and decrease  fall risk.     Time  3    Period  Weeks    Status  New    Target Date  06/26/18        PT Long Term Goals - 06/03/18 1858      PT LONG TERM GOAL #1   Title  Patient will improve B LE strength by at least 1/2 MMT grade to promote ability to perform standing tasks with decreased fall risk.     Time  8    Period  Weeks    Status  New    Target Date  07/31/18      PT LONG TERM GOAL #2   Title  Patient will improve TUG time to 12 seconds or less with use of SPC to promote functional mobility and decrease fall risk.     Baseline  18.4 seconds on average with SPC on L (06/03/2018)    Time  8    Period  Weeks    Status  New    Target Date  07/31/18      PT LONG TERM GOAL #3   Title  Patient will improve FOTO score by at least 10 points as a demonstration of improved function.     Baseline  Lower Leg FOTO 40 (06/03/2018)    Time  8    Period  Weeks    Status  New    Target Date  07/31/18      PT LONG TERM GOAL #4   Title  Patient will be able to ambulate 100 ft or more with SBA to independent to promote mobility.     Baseline  Pt currently ambulates with SPC (06/03/2018)    Time  8    Period  Weeks    Status  New    Target Date  07/31/18            Plan - 06/23/18 1324    Clinical Impression Statement  Worked on LE strengthening to promote ability to support herself when performing standing tasks. Mainly performed strengthening exercises in sitting secondary to pt stating feeling general weakness. Rest breaks provided as appropriate. Pt states vision is a little better, and strength feels a little better as well after session.    Rehab Potential  Fair     Clinical Impairments Affecting Rehab Potential  (-) age, hx of falls, weakness, medical history; (+) motivated    PT Frequency  2x / week    PT Duration  8 weeks    PT Treatment/Interventions  Therapeutic activities;Therapeutic exercise;Neuromuscular re-education;Balance training;Patient/family education;Manual techniques;Brissett needling;Aquatic Therapy;Electrical Stimulation;Iontophoresis 4mg /ml Dexamethasone;Gait training    PT Next Visit Plan  trunk, B hip and LE strengthening, balance, gait, manual techniques and modalities PRN.     Consulted and Agree with Plan of Care  Patient       Patient will benefit from skilled therapeutic intervention in order to improve the following deficits and impairments:  Postural dysfunction, Improper body mechanics, Difficulty walking, Decreased strength, Decreased balance, Abnormal gait  Visit Diagnosis: Muscle weakness (generalized)  History of falling  Difficulty in walking, not elsewhere classified     Problem List Patient Active Problem List   Diagnosis Date Noted  . S/P ORIF (open reduction internal fixation) fracture radius and ulnar fx 04/08/18 04/10/2018  . Distal radius and ulna fracture, right   . History of CVA (cerebrovascular accident)   . Benign essential HTN   . Tachycardia   . Leukocytosis   . Seizures (  [place])   . Stroke-like episode ([place]) s/p IV tPA 04/03/2018  . Syncope   . TIA (transient ischemic attack) 11/04/2017  . Malnutrition of moderate degree 08/26/2017  . Total knee replacement status 08/07/2017  . Closed left hip fracture ([place]) 02/12/2017  . HTN (hypertension) 02/12/2017  . HLD (hyperlipidemia) 02/12/2017    Joneen Boers PT, DPT   06/23/2018, 9:02 PM  [facility] [address], [place], [address] Phone: [phone]   Fax:  [phone]  Name: ZYKIA WALLA MRN: [id number] Date of Birth: 1930-11-17

## 2018-06-25 ENCOUNTER — Ambulatory Visit: Payer: PPO | Admitting: Occupational Therapy

## 2018-06-25 ENCOUNTER — Other Ambulatory Visit: Payer: Self-pay | Admitting: *Deleted

## 2018-06-25 ENCOUNTER — Ambulatory Visit: Payer: PPO

## 2018-06-25 DIAGNOSIS — M6281 Muscle weakness (generalized): Secondary | ICD-10-CM

## 2018-06-25 DIAGNOSIS — Z9181 History of falling: Secondary | ICD-10-CM

## 2018-06-25 DIAGNOSIS — M25631 Stiffness of right wrist, not elsewhere classified: Secondary | ICD-10-CM

## 2018-06-25 DIAGNOSIS — R262 Difficulty in walking, not elsewhere classified: Secondary | ICD-10-CM

## 2018-06-25 DIAGNOSIS — L905 Scar conditions and fibrosis of skin: Secondary | ICD-10-CM

## 2018-06-25 NOTE — Therapy (Signed)
[facility] [address], [place], [address] Phone: [phone]   Fax:  [phone]  Occupational Therapy Treatment  Patient Details  Name: Kellie Harris MRN: [id number] Date of Birth: April 24, 1931 Referring Provider (OT): [place]   Encounter Date: 06/25/2018  OT End of Session - 06/25/18 1427    Visit Number  7    Number of Visits  7    Date for OT Re-Evaluation  06/25/18    OT Start Time  2993    OT Stop Time  1426    OT Time Calculation (min)  38 min    Activity Tolerance  Patient tolerated treatment well    Behavior During Therapy  [facility] for tasks assessed/performed       Past Medical History:  Diagnosis Date  . Arthritis    right knee, left shoulder/ OSTEO  . Cancer ([place])    skin   . Degenerative disc disease, lumbar    L4-L5  . Diverticulosis    ITIS  . History of hiatal hernia   . Hydronephrosis    right  . Hypercholesteremia   . Hypertension    CONTROLLED ON MEDS  . IBS (irritable bowel syndrome)   . Knee fracture, right 03/05/2015   DIFFICULTY WITH AMBULATION  . Osteopenia   . Ovarian cyst   . Pancreatic insufficiency   . Wears hearing aid    bilateral    Past Surgical History:  Procedure Laterality Date  . CATARACT EXTRACTION W/PHACO Right 11/02/2015   Procedure: CATARACT EXTRACTION PHACO AND INTRAOCULAR LENS PLACEMENT (IOC);  Surgeon: Leandrew Koyanagi, MD;  Location: [place];  Service: Ophthalmology;  Laterality: Right;  . CATARACT EXTRACTION W/PHACO Left 07/18/2016   Procedure: CATARACT EXTRACTION PHACO AND INTRAOCULAR LENS PLACEMENT (IOC);  Surgeon: Leandrew Koyanagi, MD;  Location: [place];  Service: Ophthalmology;  Laterality: Left;  . COLONOSCOPY    . FRACTURE SURGERY Left    hip  . history of left hip fracture    . INTRAMEDULLARY (IM) NAIL INTERTROCHANTERIC Left 02/12/2017   Procedure: INTRAMEDULLARY (IM) NAIL INTERTROCHANTRIC;  Surgeon: Earnestine Leys, MD;  Location: [facility];  Service: Orthopedics;  Laterality: Left;  . kidney stent    . OOPHORECTOMY Bilateral 2015  . ORIF WRIST FRACTURE Right 04/08/2018   Procedure: OPEN REDUCTION INTERNAL FIXATION (ORIF) WRIST FRACTURE;  Surgeon: Altamese Drew, MD;  Location: [place];  Service: Orthopedics;  Laterality: Right;  . TONSILLECTOMY    . TOTAL KNEE ARTHROPLASTY Right 08/07/2017   Procedure: TOTAL KNEE ARTHROPLASTY;  Surgeon: Earnestine Leys, MD;  Location: [facility];  Service: Orthopedics;  Laterality: Right;    There were no vitals filed for this visit.  Subjective Assessment - 06/25/18 1425    Subjective   I am doing better- using my hand more - making bed, laundry , light stuff in kitchen and around the house - no pain in my wrist - open apple juice this am     Patient Stated Goals  I want to be able to use my R hand like before to open jars, cut food, lift/grip/ squeeze and hold objects , write better     Currently in Pain?  No/denies         [facility] OT Assessment - 06/25/18 0001      AROM   Right Wrist Extension  56 Degrees    Right Wrist Flexion  80 Degrees    Right Wrist Radial Deviation  24 Degrees  Right Wrist Ulnar Deviation  25 Degrees      Strength   Right Hand Grip (lbs)  24    Right Hand Lateral Pinch  9 lbs    Right Hand 3 Point Pinch  7 lbs    Left Hand Grip (lbs)  35    Left Hand Lateral Pinch  11 lbs    Left Hand 3 Point Pinch  10 lbs       assess wrist AROM and grip /prehension strength - see flowsheet PRWHE done and simulated - pt report no pain at wrist  Function 1 1/2 /50 - was at eval 20/50  Pt can cont with PROM and stretches for wrist flexion and extention end range  And upgrade her putty to green firm for grip  2 x 15 reps  and can increase to 2 sets in few days Cont with 2 lbs weight 3 x wk for few wks - until she feels like she do not feel it anymore                   OT Education - 06/25/18 1427    Education Details   dishcarge instructions and upgrade putty     Person(s) Educated  Patient    Methods  Demonstration;Handout;Explanation    Comprehension  Returned demonstration;Verbalized understanding       OT Short Term Goals - 06/25/18 1431      OT SHORT TERM GOAL #1   Title  Pt to be independent in HEP to increase wrist AROM , strength  and decrease scar tissue  to improve use of R dominant hand and wrist in ADL's and IADl's     Status  Achieved        OT Long Term Goals - 06/25/18 1431      OT LONG TERM GOAL #1   Title  R wrist AROM improve with 10 to 20 degrees to turn doorknob and use in hygiene with out issues     Status  Achieved      OT LONG TERM GOAL #2   Title  Pt R wrist strength increase for pt to pick up and carry more than 4 lbs without pain or issues to push door open, push up in bed,  and cut with knife     Status  Achieved      OT LONG TERM GOAL #3   Title  R grip  increase with 5-8 lbs and prehension with at least 2 lbs to be able to cut with knife , do buttons, open jars with more ease     Baseline  24 Lbs R , L 35 lbs  and can carry 6 lbs with slight pull at shoulder - not wrist - can do functional act    Status  Achieved      OT LONG TERM GOAL #4   Title  Function score on PRWHE improve with more than 10 points     Baseline  function on PRWHE at eval 19/50  and now 1 1/2 /50    Status  Achieved            Plan - 06/25/18 1428    Clinical Impression Statement  Pt is 11 wks s/p R wrist fx - ORIF -showed great progress in scar , ROM and strength - pt was able to carry 6 lbs this date with light pull in shoulder - wrist was okay - function on PRWHE improve to 1 08/14/48 - was  20 /50 at eval -report no pain- grip still decrease - did upgrade her putty - will reassess than in 2-3wks while she is here for PT session for balance - but discharge from OT services     Plan  discharge with HEP     Clinical Decision Making  Limited treatment options, no task modification  necessary    OT Home Exercise Plan  see pt instruction    Consulted and Agree with Plan of Care  Patient       Patient will benefit from skilled therapeutic intervention in order to improve the following deficits and impairments:     Visit Diagnosis: Muscle weakness (generalized)  Stiffness of right wrist, not elsewhere classified  Scar condition and fibrosis of skin    Problem List Patient Active Problem List   Diagnosis Date Noted  . S/P ORIF (open reduction internal fixation) fracture radius and ulnar fx 04/08/18 04/10/2018  . Distal radius and ulna fracture, right   . History of CVA (cerebrovascular accident)   . Benign essential HTN   . Tachycardia   . Leukocytosis   . Seizures ([place])   . Stroke-like episode ([place]) s/p IV tPA 04/03/2018  . Syncope   . TIA (transient ischemic attack) 11/04/2017  . Malnutrition of moderate degree 08/26/2017  . Total knee replacement status 08/07/2017  . Closed left hip fracture ([place]) 02/12/2017  . HTN (hypertension) 02/12/2017  . HLD (hyperlipidemia) 02/12/2017    Rosalyn Gess OTR/L,CLT 06/25/2018, 2:34 PM  [facility] [address] [place], [address] Phone: [phone]   Fax:  [phone]  Name: Kellie Harris MRN: [id number] Date of Birth: 1931/05/24

## 2018-06-25 NOTE — Therapy (Signed)
[facility] [address] [place], [address] Phone: [phone]   Fax:  [phone]  Physical Therapy Treatment  Patient Details  Name: Kellie Harris MRN: [id number] Date of Birth: 1930-09-16 Referring Provider (PT): Antony Contras, MD   Encounter Date: 06/25/2018  PT End of Session - 06/25/18 1301    Visit Number  6    Number of Visits  17    Date for PT Re-Evaluation  07/31/18    Authorization Type  6    Authorization Time Period  of 10 progress report    PT Start Time  1301    PT Stop Time  1343    PT Time Calculation (min)  42 min    Equipment Utilized During Treatment  Gait belt    Activity Tolerance  Patient tolerated treatment well    Behavior During Therapy  WFL for tasks assessed/performed       Past Medical History:  Diagnosis Date  . Arthritis    right knee, left shoulder/ OSTEO  . Cancer ([place])    skin   . Degenerative disc disease, lumbar    L4-L5  . Diverticulosis    ITIS  . History of hiatal hernia   . Hydronephrosis    right  . Hypercholesteremia   . Hypertension    CONTROLLED ON MEDS  . IBS (irritable bowel syndrome)   . Knee fracture, right 03/05/2015   DIFFICULTY WITH AMBULATION  . Osteopenia   . Ovarian cyst   . Pancreatic insufficiency   . Wears hearing aid    bilateral    Past Surgical History:  Procedure Laterality Date  . CATARACT EXTRACTION W/PHACO Right 11/02/2015   Procedure: CATARACT EXTRACTION PHACO AND INTRAOCULAR LENS PLACEMENT (IOC);  Surgeon: Leandrew Koyanagi, MD;  Location: [place];  Service: Ophthalmology;  Laterality: Right;  . CATARACT EXTRACTION W/PHACO Left 07/18/2016   Procedure: CATARACT EXTRACTION PHACO AND INTRAOCULAR LENS PLACEMENT (IOC);  Surgeon: Leandrew Koyanagi, MD;  Location: [place];  Service: Ophthalmology;  Laterality: Left;  . COLONOSCOPY    . FRACTURE SURGERY Left    hip  . history of left hip fracture     . INTRAMEDULLARY (IM) NAIL INTERTROCHANTERIC Left 02/12/2017   Procedure: INTRAMEDULLARY (IM) NAIL INTERTROCHANTRIC;  Surgeon: Earnestine Leys, MD;  Location: [facility];  Service: Orthopedics;  Laterality: Left;  . kidney stent    . OOPHORECTOMY Bilateral 2015  . ORIF WRIST FRACTURE Right 04/08/2018   Procedure: OPEN REDUCTION INTERNAL FIXATION (ORIF) WRIST FRACTURE;  Surgeon: Altamese Queens, MD;  Location: [place];  Service: Orthopedics;  Laterality: Right;  . TONSILLECTOMY    . TOTAL KNEE ARTHROPLASTY Right 08/07/2017   Procedure: TOTAL KNEE ARTHROPLASTY;  Surgeon: Earnestine Leys, MD;  Location: [facility];  Service: Orthopedics;  Laterality: Right;    There were no vitals filed for this visit.  Subjective Assessment - 06/25/18 1303    Subjective  Feels a little more normal today with the vision and the weakness. The weakness and the vision got better half way home after last session.     Pertinent History  Pt states being referred to PT for balance and gait. Gait and balance has been going dowhnill since December 2018 after her R TKA.  [place] after being discharged from skilled nursing facility and fractured the head of her L femur. Did not have surgery due to the fracture not being bad. Had a sycope episode March 2019. She was out  all of a sudden. Had a total stroke work up and was within normal limits.  Was coming along alright. However, had another syncopal episode 04/03/2018. Pt was standing and talking to her neighbor. Does not remember the ambulance coming. She seemed like she had a stroke. Had a second stroke work up and the results were within normal limits. Took her longer to recover from her syncopal episode.  Currently taking plavix.  Currently taking the medication Kepra for seizzures. Pt states that when the episodes occur, there is no warning.   Had R wrist fracture ORIF on 04/08/2018.  Pt currently NWB R wrist.   Mainly uses her hurricain SPC.  Furniture walk at home. Also has a 3 wheeler and 4  wheeler rollator.  No falls other than due to her syncopal episodes. Pt states CPR can be performed if needed. Nothing long term. Can call ambulance if pt passes out or has a seizure. Neighbor was on her L side when talking to her prior to passing out.       Patient Stated Goals  Be able to walk better.    Currently in Pain?  Other (Comment)   no complain of pain   Pain Onset  More than a month ago                               PT Education - 06/25/18 1305    Education Details  ther-ex    Person(s) Educated  Patient    Methods  Explanation;Demonstration;Tactile cues;Verbal cues    Comprehension  Returned demonstration;Verbalized understanding       Objectives  No latex band allergies   MedbridgeAccess Code: [email or web address]   Gait: antalgic, decreased stance L LE, L femoral IR during L LE stance phase.    Ther-ex   Seated B ankle DF/PF 15x2 each way  LAQ  L10x5 seconds for 2 sets R10x5 seconds for 2 sets    Sitting with upright posture Gentle manual trunk perturbation from PT 30 seconds x 3  Manually resisted trunk flexion isometrics 10x5 seconds for 3 sets   seated B scapular retraction 10x5 seconds for 2 sets  Seated clam shells, hips less than 90 degrees flexion              Yellow 10x2, towel padding   Forward step up onto Air Ex pad with one UE assist   R 10x  L 10x  Standing mini squats with B UE assist 10x  Standing alternating toe taps onto treadmill platform 10x2 each LE with 2 finger assist    Sit <> stand with emphasis on femoral control 5x           Improved exercise technique, movement at target joints, use of target muscles after min to mod verbal, visual, tactile cues.   Continued with gentle trunk, glute and quadriceps strengthening to promote ability to support herself when performing standing activities. Performed similar exercises today as last  session secondary to pt feeling a little better with her vision and strength after previous treatment. Pt tolerated session well without aggravation of symptoms.       PT Short Term Goals - 06/03/18 1858      PT SHORT TERM GOAL #1   Title  Patient will be independent with her HEP to promote balance and decrease fall risk.     Time  3    Period  Weeks    Status  New    Target Date  06/26/18        PT Long Term Goals - 06/03/18 1858      PT LONG TERM GOAL #1   Title  Patient will improve B LE strength by at least 1/2 MMT grade to promote ability to perform standing tasks with decreased fall risk.     Time  8    Period  Weeks    Status  New    Target Date  07/31/18      PT LONG TERM GOAL #2   Title  Patient will improve TUG time to 12 seconds or less with use of SPC to promote functional mobility and decrease fall risk.     Baseline  18.4 seconds on average with SPC on L (06/03/2018)    Time  8    Period  Weeks    Status  New    Target Date  07/31/18      PT LONG TERM GOAL #3   Title  Patient will improve FOTO score by at least 10 points as a demonstration of improved function.     Baseline  Lower Leg FOTO 40 (06/03/2018)    Time  8    Period  Weeks    Status  New    Target Date  07/31/18      PT LONG TERM GOAL #4   Title  Patient will be able to ambulate 100 ft or more with SBA to independent to promote mobility.     Baseline  Pt currently ambulates with SPC (06/03/2018)    Time  8    Period  Weeks    Status  New    Target Date  07/31/18            Plan - 06/25/18 1309    Clinical Impression Statement  Continued with gentle trunk, glute and quadriceps strengthening to promote ability to support herself when performing standing activities. Performed similar exercises today as last session secondary to pt feeling a little better with her vision and strength after previous treatment. Pt tolerated session well without aggravation of symptoms.      Rehab Potential   Fair    Clinical Impairments Affecting Rehab Potential  (-) age, hx of falls, weakness, medical history; (+) motivated    PT Frequency  2x / week    PT Duration  8 weeks    PT Treatment/Interventions  Therapeutic activities;Therapeutic exercise;Neuromuscular re-education;Balance training;Patient/family education;Manual techniques;Martel needling;Aquatic Therapy;Electrical Stimulation;Iontophoresis 4mg /ml Dexamethasone;Gait training    PT Next Visit Plan  trunk, B hip and LE strengthening, balance, gait, manual techniques and modalities PRN.     Consulted and Agree with Plan of Care  Patient       Patient will benefit from skilled therapeutic intervention in order to improve the following deficits and impairments:  Postural dysfunction, Improper body mechanics, Difficulty walking, Decreased strength, Decreased balance, Abnormal gait  Visit Diagnosis: Muscle weakness (generalized)  History of falling  Difficulty in walking, not elsewhere classified     Problem List Patient Active Problem List   Diagnosis Date Noted  . S/P ORIF (open reduction internal fixation) fracture radius and ulnar fx 04/08/18 04/10/2018  . Distal radius and ulna fracture, right   . History of CVA (cerebrovascular accident)   . Benign essential HTN   . Tachycardia   . Leukocytosis   . Seizures ([place])   . Stroke-like episode ([place]) s/p IV tPA 04/03/2018  . Syncope   . TIA (transient  ischemic attack) 11/04/2017  . Malnutrition of moderate degree 08/26/2017  . Total knee replacement status 08/07/2017  . Closed left hip fracture ([place]) 02/12/2017  . HTN (hypertension) 02/12/2017  . HLD (hyperlipidemia) 02/12/2017    Joneen Boers PT, DPT   06/25/2018, 1:58 PM  [facility] [address], [place], [address] Phone: [phone]   Fax:  [phone]  Name: Kellie Harris MRN: [id number] Date of Birth: February 24, 1931

## 2018-06-25 NOTE — Patient Outreach (Signed)
[facility]) Care Management  06/25/2018  Kellie Harris 06/05/1931 [id number]   Telephone follow up call   Patient with recent Hospital admission at [facility], 8/22-8/29 with stroke like episode, fall, fracture of right radius/ulnar and ORIF [facility] stay 8/29 - 9/13.  PMHx: includes but not limited to HTN, HLD,IBS, TIA , left hip fracture  Successful outreach call to patient.  Patient discussed still feeling weak and tires easily .  Patient reports that she continues to participate in outpatient therapy. She discussed that her wrist has healed well, she completed her last day of therapy on today and has been released to lift more.  Patient discussed having an  episode during a  Therapy session   she called it a spell  , her eyes just got blurry, states she was able to see therapist name tag, she denied dizziness, they checked her blood pressure and everything was okay.  Patient discussed that she will continue with outpatient physical therapy to increase balance and strength.  Appointments  Patient discussed having neurology visit in next 2 weeks , and PCP in the next month. Patient has scheduled eye appointment, reports she missed earlier appointment due to being in hospital or rehab, she discussed next available appointment is January.   High Blood Pressure  Patient reports that her blood pressure has been in the <140/ . Patient states that she continues to watch and limit salt in diet.   Nutrition/weight loss Patient discussed weight down to 95, previous weight 98 in September. Patient reports little decrease in appetite , she tires eating  several small meals, she does try drinking ensure max and her daughter makes sure she keeps a supply.  Patient voice is slightly hoarse she states that she is not sick. Discussed with patient her progress since recent admission and positive reinforcement of completing therapy on wrist, improvement in mobility .    Plan Will  plan follow up call in the next 3 weeks.  Encouraged patient to notify MD of any new concerns related to dizzy like episodes , and new concerns for a sooner visit.    [name], RN, [place] Management Coordinator  [phone]- Mobile [phone]- Toll Free Main Office

## 2018-06-25 NOTE — Patient Instructions (Signed)
Cont with stretch for wrist flexion and extention  Can cont if want to 2 lbs weight for wrist - 2 to 3 x wk in all directions  And upgrade to green putty for gripping   2 x 15 reps 2  X day and increase to 2 sets

## 2018-06-30 ENCOUNTER — Ambulatory Visit: Payer: PPO

## 2018-06-30 DIAGNOSIS — R262 Difficulty in walking, not elsewhere classified: Secondary | ICD-10-CM

## 2018-06-30 DIAGNOSIS — Z9181 History of falling: Secondary | ICD-10-CM

## 2018-06-30 DIAGNOSIS — M6281 Muscle weakness (generalized): Secondary | ICD-10-CM | POA: Diagnosis not present

## 2018-06-30 NOTE — Therapy (Signed)
[facility] [address], [place], [address] Phone: [phone]   Fax:  [phone]  Physical Therapy Treatment  Patient Details  Name: Kellie Harris MRN: [id number] Date of Birth: November 30, 1930 Referring Provider (PT): Antony Contras, MD   Encounter Date: 06/30/2018  PT End of Session - 06/30/18 1438    Visit Number  7    Number of Visits  17    Date for PT Re-Evaluation  07/31/18    Authorization Type  7    Authorization Time Period  of 10 progress report    PT Start Time  1440    PT Stop Time  1530    PT Time Calculation (min)  50 min    Equipment Utilized During Treatment  Gait belt    Activity Tolerance  Patient tolerated treatment well    Behavior During Therapy  [facility] for tasks assessed/performed       Past Medical History:  Diagnosis Date  . Arthritis    right knee, left shoulder/ OSTEO  . Cancer ([place])    skin   . Degenerative disc disease, lumbar    L4-L5  . Diverticulosis    ITIS  . History of hiatal hernia   . Hydronephrosis    right  . Hypercholesteremia   . Hypertension    CONTROLLED ON MEDS  . IBS (irritable bowel syndrome)   . Knee fracture, right 03/05/2015   DIFFICULTY WITH AMBULATION  . Osteopenia   . Ovarian cyst   . Pancreatic insufficiency   . Wears hearing aid    bilateral    Past Surgical History:  Procedure Laterality Date  . CATARACT EXTRACTION W/PHACO Right 11/02/2015   Procedure: CATARACT EXTRACTION PHACO AND INTRAOCULAR LENS PLACEMENT (IOC);  Surgeon: Leandrew Koyanagi, MD;  Location: [facility];  Service: Ophthalmology;  Laterality: Right;  . CATARACT EXTRACTION W/PHACO Left 07/18/2016   Procedure: CATARACT EXTRACTION PHACO AND INTRAOCULAR LENS PLACEMENT (IOC);  Surgeon: Leandrew Koyanagi, MD;  Location: [facility];  Service: Ophthalmology;  Laterality: Left;  . COLONOSCOPY    . FRACTURE SURGERY Left    hip  . history of left hip fracture     . INTRAMEDULLARY (IM) NAIL INTERTROCHANTERIC Left 02/12/2017   Procedure: INTRAMEDULLARY (IM) NAIL INTERTROCHANTRIC;  Surgeon: Earnestine Leys, MD;  Location: [facility];  Service: Orthopedics;  Laterality: Left;  . kidney stent    . OOPHORECTOMY Bilateral 2015  . ORIF WRIST FRACTURE Right 04/08/2018   Procedure: OPEN REDUCTION INTERNAL FIXATION (ORIF) WRIST FRACTURE;  Surgeon: Altamese Coqui, MD;  Location: [place];  Service: Orthopedics;  Laterality: Right;  . TONSILLECTOMY    . TOTAL KNEE ARTHROPLASTY Right 08/07/2017   Procedure: TOTAL KNEE ARTHROPLASTY;  Surgeon: Earnestine Leys, MD;  Location: [facility];  Service: Orthopedics;  Laterality: Right;    There were no vitals filed for this visit.   Subjective Assessment - 06/30/18 1440    Subjective  The vision is better.  Had a bad weekend with pain with her low back and L hip. The pain there is better. The pain across her back is gone. Noticed her back pain when she woke up Friday morning when turrning from her back to her R side.     Pertinent History  Pt states being referred to PT for balance and gait. Gait and balance has been going dowhnill since December 2018 after her R TKA.  [facility] after being discharged from skilled nursing facility and fractured the  head of her L femur. Did not have surgery due to the fracture not being bad. Had a sycope episode March 2019. She was out all of a sudden. Had a total stroke work up and was within normal limits.  Was coming along alright. However, had another syncopal episode 04/03/2018. Pt was standing and talking to her neighbor. Does not remember the ambulance coming. She seemed like she had a stroke. Had a second stroke work up and the results were within normal limits. Took her longer to recover from her syncopal episode.  Currently taking plavix.  Currently taking the medication Kepra for seizzures. Pt states that when the episodes occur, there is no warning.   Had R wrist fracture ORIF on 04/08/2018.  Pt  currently NWB R wrist.   Mainly uses her hurricain SPC.  Furniture walk at home. Also has a 3 wheeler and 4 wheeler rollator.  No falls other than due to her syncopal episodes. Pt states CPR can be performed if needed. Nothing long term. Can call ambulance if pt passes out or has a seizure. Neighbor was on her L side when talking to her prior to passing out.       Patient Stated Goals  Be able to walk better.    Currently in Pain?  Other (Comment)   B shoulder pain, no back pain.    Pain Onset  More than a month ago                                 PT Education - 06/30/18 1445    Education Details  ther-ex, HEP    Person(s) Educated  Patient    Methods  Explanation;Demonstration;Tactile cues;Verbal cues;Handout    Comprehension  Returned demonstration;Verbalized understanding         Objectives  No latex band allergies   MedbridgeAccess Code: [email or web address]   Gait: antalgic, decreased stance L LE, L femoral IR during L LE stance phase.    Ther-ex   Seated B ankle DF/PF 15x2 each way  LAQ  L 2 lbs 5x3 R2 lbs 5x3   Sitting with upright posture  Manually resisted trunk flexion isometrics 10x5 seconds for 3 sets  seated B scapular retraction 10x5 seconds for 2 sets  Standing hip abduction with B UE assist, yellow band around thighs 10x2  Forward step up onto Air Ex pad with one UE assist              R 10x             L 10x  Standing mini squats with B UE assist 10x  Standing alternating toe taps onto treadmill platform 10x2 each LE with 2 finger assist    Sit <> stand with emphasis on femoral control 5x  Standing leg press resisting double red band with bilateral UE assist 10x each LE  Upgraded side stepping home exercise to using yellow band around thighs 5x 2 daily. Counter support and chair nearby for safety. Pt demonstrated and verbalized understanding.     Try cowboy  walk next visit if appropriate to promote glute med strengthening and femoral control   Improved exercise technique, movement at target joints, use of target muscles after min to mod verbal, visual, tactile cues.   Continued working on trunk and LE strengthening to promote balance and ability to support herself in standing. Upgraded side stepping home exercise to pt using yellow band to promote  glute med muscle strengthening to promote more steady ambulation.       PT Short Term Goals - 06/03/18 1858      PT SHORT TERM GOAL #1   Title  Patient will be independent with her HEP to promote balance and decrease fall risk.     Time  3    Period  Weeks    Status  New    Target Date  06/26/18        PT Long Term Goals - 06/03/18 1858      PT LONG TERM GOAL #1   Title  Patient will improve B LE strength by at least 1/2 MMT grade to promote ability to perform standing tasks with decreased fall risk.     Time  8    Period  Weeks    Status  New    Target Date  07/31/18      PT LONG TERM GOAL #2   Title  Patient will improve TUG time to 12 seconds or less with use of SPC to promote functional mobility and decrease fall risk.     Baseline  18.4 seconds on average with SPC on L (06/03/2018)    Time  8    Period  Weeks    Status  New    Target Date  07/31/18      PT LONG TERM GOAL #3   Title  Patient will improve FOTO score by at least 10 points as a demonstration of improved function.     Baseline  Lower Leg FOTO 40 (06/03/2018)    Time  8    Period  Weeks    Status  New    Target Date  07/31/18      PT LONG TERM GOAL #4   Title  Patient will be able to ambulate 100 ft or more with SBA to independent to promote mobility.     Baseline  Pt currently ambulates with SPC (06/03/2018)    Time  8    Period  Weeks    Status  New    Target Date  07/31/18             Plan - 06/30/18 1445    Clinical Impression Statement  Continued working on trunk and LE strengthening to  promote balance and ability to support herself in standing. Upgraded side stepping home exercise to pt using yellow band to promote glute med muscle strengthening to promote more steady ambulation.     Rehab Potential  Fair    Clinical Impairments Affecting Rehab Potential  (-) age, hx of falls, weakness, medical history; (+) motivated    PT Frequency  2x / week    PT Duration  8 weeks    PT Treatment/Interventions  Therapeutic activities;Therapeutic exercise;Neuromuscular re-education;Balance training;Patient/family education;Manual techniques;Demetrius needling;Aquatic Therapy;Electrical Stimulation;Iontophoresis 4mg /ml Dexamethasone;Gait training    PT Next Visit Plan  trunk, B hip and LE strengthening, balance, gait, manual techniques and modalities PRN.     Consulted and Agree with Plan of Care  Patient       Patient will benefit from skilled therapeutic intervention in order to improve the following deficits and impairments:  Postural dysfunction, Improper body mechanics, Difficulty walking, Decreased strength, Decreased balance, Abnormal gait  Visit Diagnosis: Muscle weakness (generalized)  History of falling  Difficulty in walking, not elsewhere classified     Problem List Patient Active Problem List   Diagnosis Date Noted  . S/P ORIF (open reduction internal fixation) fracture radius  and ulnar fx 04/08/18 04/10/2018  . Distal radius and ulna fracture, right   . History of CVA (cerebrovascular accident)   . Benign essential HTN   . Tachycardia   . Leukocytosis   . Seizures ([place])   . Stroke-like episode ([place]) s/p IV tPA 04/03/2018  . Syncope   . TIA (transient ischemic attack) 11/04/2017  . Malnutrition of moderate degree 08/26/2017  . Total knee replacement status 08/07/2017  . Closed left hip fracture ([place]) 02/12/2017  . HTN (hypertension) 02/12/2017  . HLD (hyperlipidemia) 02/12/2017    Joneen Boers PT, DPT   06/30/2018, 4:34 PM  [facility] [address] [place], [address] Phone: [phone]   Fax:  [phone]  Name: Kellie Harris MRN: [id number] Date of Birth: 1931/03/13

## 2018-06-30 NOTE — Patient Instructions (Signed)
Upgraded side stepping home exercise to using yellow band around thighs 5x 2 daily. Counter support and chair near by for safety. Pt demonstrated and verbalized understanding.

## 2018-07-02 ENCOUNTER — Ambulatory Visit: Payer: PPO

## 2018-07-02 DIAGNOSIS — R262 Difficulty in walking, not elsewhere classified: Secondary | ICD-10-CM

## 2018-07-02 DIAGNOSIS — M6281 Muscle weakness (generalized): Secondary | ICD-10-CM

## 2018-07-02 DIAGNOSIS — Z9181 History of falling: Secondary | ICD-10-CM

## 2018-07-02 NOTE — Patient Instructions (Signed)
  MedbridgeAccess Code: [email or web address]   Standing Hip Extension with Counter Support   10x2-3 with yellow band around thighs   Standing Hip Abduction with Counter Support   Yellow band around thighs 10x2

## 2018-07-02 NOTE — Therapy (Signed)
[facility] [address] [place], [address] Phone: [phone]   Fax:  [phone]  Physical Therapy Treatment  Patient Details  Name: Kellie Harris MRN: [id number] Date of Birth: October 29, 1930 Referring Provider (PT): Antony Contras, MD   Encounter Date: 07/02/2018  PT End of Session - 07/02/18 1351    Visit Number  8    Number of Visits  17    Date for PT Re-Evaluation  07/31/18    Authorization Type  8    Authorization Time Period  of 10 progress report    PT Start Time  1351    PT Stop Time  1430    PT Time Calculation (min)  39 min    Equipment Utilized During Treatment  Gait belt    Activity Tolerance  Patient tolerated treatment well    Behavior During Therapy  W[facility] for tasks assessed/performed       Past Medical History:  Diagnosis Date  . Arthritis    right knee, left shoulder/ OSTEO  . Cancer ([place])    skin   . Degenerative disc disease, lumbar    L4-L5  . Diverticulosis    ITIS  . History of hiatal hernia   . Hydronephrosis    right  . Hypercholesteremia   . Hypertension    CONTROLLED ON MEDS  . IBS (irritable bowel syndrome)   . Knee fracture, right 03/05/2015   DIFFICULTY WITH AMBULATION  . Osteopenia   . Ovarian cyst   . Pancreatic insufficiency   . Wears hearing aid    bilateral    Past Surgical History:  Procedure Laterality Date  . CATARACT EXTRACTION W/PHACO Right 11/02/2015   Procedure: CATARACT EXTRACTION PHACO AND INTRAOCULAR LENS PLACEMENT (IOC);  Surgeon: Leandrew Koyanagi, MD;  Location: [facility];  Service: Ophthalmology;  Laterality: Right;  . CATARACT EXTRACTION W/PHACO Left 07/18/2016   Procedure: CATARACT EXTRACTION PHACO AND INTRAOCULAR LENS PLACEMENT (IOC);  Surgeon: Leandrew Koyanagi, MD;  Location: [facility];  Service: Ophthalmology;  Laterality: Left;  . COLONOSCOPY    . FRACTURE SURGERY Left    hip  . history of left hip fracture     . INTRAMEDULLARY (IM) NAIL INTERTROCHANTERIC Left 02/12/2017   Procedure: INTRAMEDULLARY (IM) NAIL INTERTROCHANTRIC;  Surgeon: Earnestine Leys, MD;  Location: [facility];  Service: Orthopedics;  Laterality: Left;  . kidney stent    . OOPHORECTOMY Bilateral 2015  . ORIF WRIST FRACTURE Right 04/08/2018   Procedure: OPEN REDUCTION INTERNAL FIXATION (ORIF) WRIST FRACTURE;  Surgeon: Altamese [place], MD;  Location: [facility];  Service: Orthopedics;  Laterality: Right;  . TONSILLECTOMY    . TOTAL KNEE ARTHROPLASTY Right 08/07/2017   Procedure: TOTAL KNEE ARTHROPLASTY;  Surgeon: Earnestine Leys, MD;  Location: [facility];  Service: Orthopedics;  Laterality: Right;    There were no vitals filed for this visit.  Subjective Assessment - 07/02/18 1353    Subjective  Feels ok. No lousy feeling. Still feels weak. Feels like no energy. Has to stop and rest when she does something. Has to take brakes when she lightly cleans her house.   Pt states that her back is not bothering her right now.     Pertinent History  Pt states being referred to PT for balance and gait. Gait and balance has been going dowhnill since December 2018 after her R TKA.  [facility] after being discharged from skilled nursing facility and fractured the head of her L femur. Did  not have surgery due to the fracture not being bad. Had a sycope episode March 2019. She was out all of a sudden. Had a total stroke work up and was within normal limits.  Was coming along alright. However, had another syncopal episode 04/03/2018. Pt was standing and talking to her neighbor. Does not remember the ambulance coming. She seemed like she had a stroke. Had a second stroke work up and the results were within normal limits. Took her longer to recover from her syncopal episode.  Currently taking plavix.  Currently taking the medication Kepra for seizzures. Pt states that when the episodes occur, there is no warning.   Had R wrist fracture ORIF on 04/08/2018.  Pt currently NWB R  wrist.   Mainly uses her hurricain SPC.  Furniture walk at home. Also has a 3 wheeler and 4 wheeler rollator.  No falls other than due to her syncopal episodes. Pt states CPR can be performed if needed. Nothing long term. Can call ambulance if pt passes out or has a seizure. Neighbor was on her L side when talking to her prior to passing out.       Patient Stated Goals  Be able to walk better.    Currently in Pain?  Other (Comment)   no complain of pain   Pain Onset  More than a month ago                               PT Education - 07/02/18 1421    Education Details  ther-ex    Person(s) Educated  Patient    Methods  Explanation;Demonstration;Tactile cues;Verbal cues    Comprehension  Returned demonstration;Verbalized understanding        Objectives  No latex band allergies   MedbridgeAccess Code: [email or web address]   Gait: antalgic, decreased stance L LE, L femoral IR during L LE stance phase.   Ther-ex  Sit <> stand 5x with emphasis on femoral control   Standing hip extension resisting yellow band around ankles with B UE assist 10x each LE  Then yellow band around distal thighs 10x2 each LE with B UE assist   Standing hip abduction with yellow band around distal thighs with B UE assist 10x2 each LE   Reviewed HEP. Pt demonstrated and verbalized understanding. Handout provided.    Standing alternating toe taps with 2 finger light touch assist 10x3 each LE  Four square step test CGA without use of AD 5x each direction.   Forward step up onto Air Ex pad with one UE assist  R 10x L 10x   Then without UE to light touch assist 10x each LE   L hip discomfort with stepping up onto Air Ex pad which eases with rest.    Improved exercise technique, movement at target joints, use of target muscles after mod verbal, visual, tactile cues.    Continued working on LE strengthening to promote balance and ability to perform  tasks such as chores at home with less difficulty.     PT Short Term Goals - 06/03/18 1858      PT SHORT TERM GOAL #1   Title  Patient will be independent with her HEP to promote balance and decrease fall risk.     Time  3    Period  Weeks    Status  New    Target Date  06/26/18        PT Long  Term Goals - 06/03/18 1858      PT LONG TERM GOAL #1   Title  Patient will improve B LE strength by at least 1/2 MMT grade to promote ability to perform standing tasks with decreased fall risk.     Time  8    Period  Weeks    Status  New    Target Date  07/31/18      PT LONG TERM GOAL #2   Title  Patient will improve TUG time to 12 seconds or less with use of SPC to promote functional mobility and decrease fall risk.     Baseline  18.4 seconds on average with SPC on L (06/03/2018)    Time  8    Period  Weeks    Status  New    Target Date  07/31/18      PT LONG TERM GOAL #3   Title  Patient will improve FOTO score by at least 10 points as a demonstration of improved function.     Baseline  Lower Leg FOTO 40 (06/03/2018)    Time  8    Period  Weeks    Status  New    Target Date  07/31/18      PT LONG TERM GOAL #4   Title  Patient will be able to ambulate 100 ft or more with SBA to independent to promote mobility.     Baseline  Pt currently ambulates with SPC (06/03/2018)    Time  8    Period  Weeks    Status  New    Target Date  07/31/18            Plan - 07/02/18 1349    Clinical Impression Statement  Continued working on LE strengthening to promote balance and ability to perform tasks such as chores at home with less difficulty.     Rehab Potential  Fair    Clinical Impairments Affecting Rehab Potential  (-) age, hx of falls, weakness, medical history; (+) motivated    PT Frequency  2x / week    PT Duration  8 weeks    PT Treatment/Interventions  Therapeutic activities;Therapeutic exercise;Neuromuscular re-education;Balance training;Patient/family education;Manual  techniques;Sweetin needling;Aquatic Therapy;Electrical Stimulation;Iontophoresis 4mg /ml Dexamethasone;Gait training    PT Next Visit Plan  trunk, B hip and LE strengthening, balance, gait, manual techniques and modalities PRN.     Consulted and Agree with Plan of Care  Patient       Patient will benefit from skilled therapeutic intervention in order to improve the following deficits and impairments:  Postural dysfunction, Improper body mechanics, Difficulty walking, Decreased strength, Decreased balance, Abnormal gait  Visit Diagnosis: Muscle weakness (generalized)  History of falling  Difficulty in walking, not elsewhere classified     Problem List Patient Active Problem List   Diagnosis Date Noted  . S/P ORIF (open reduction internal fixation) fracture radius and ulnar fx 04/08/18 04/10/2018  . Distal radius and ulna fracture, right   . History of CVA (cerebrovascular accident)   . Benign essential HTN   . Tachycardia   . Leukocytosis   . Seizures ([place])   . Stroke-like episode ([place]) s/p IV tPA 04/03/2018  . Syncope   . TIA (transient ischemic attack) 11/04/2017  . Malnutrition of moderate degree 08/26/2017  . Total knee replacement status 08/07/2017  . Closed left hip fracture ([place]) 02/12/2017  . HTN (hypertension) 02/12/2017  . HLD (hyperlipidemia) 02/12/2017    Joneen Boers PT, DPT   07/02/2018,  5:08 PM  [facility] [address], [place], [address] Phone: [phone]   Fax:  [phone]  Name: Kellie Harris MRN: [id number] Date of Birth: 28-Oct-1930

## 2018-07-04 DIAGNOSIS — Z8673 Personal history of transient ischemic attack (TIA), and cerebral infarction without residual deficits: Secondary | ICD-10-CM | POA: Diagnosis not present

## 2018-07-04 DIAGNOSIS — E782 Mixed hyperlipidemia: Secondary | ICD-10-CM | POA: Diagnosis not present

## 2018-07-04 DIAGNOSIS — I10 Essential (primary) hypertension: Secondary | ICD-10-CM | POA: Diagnosis not present

## 2018-07-07 ENCOUNTER — Ambulatory Visit: Payer: PPO

## 2018-07-07 DIAGNOSIS — R262 Difficulty in walking, not elsewhere classified: Secondary | ICD-10-CM

## 2018-07-07 DIAGNOSIS — M6281 Muscle weakness (generalized): Secondary | ICD-10-CM

## 2018-07-07 DIAGNOSIS — Z9181 History of falling: Secondary | ICD-10-CM

## 2018-07-07 NOTE — Therapy (Signed)
[facility] [address], [place], [address] Phone: [phone]   Fax:  [phone]  Physical Therapy Treatment And Progress Report (06/03/2018- 07/07/2018)  Patient Details  Name: Kellie Harris MRN: [id number] Date of Birth: June 17, 1931 Referring Provider (PT): Antony Contras, MD   Encounter Date: 07/07/2018  PT End of Session - 07/07/18 1309    Visit Number  9    Number of Visits  35    Date for PT Re-Evaluation  09/11/18    Authorization Type  9    Authorization Time Period  of 10 progress report    PT Start Time  1309   pt arrived late   PT Stop Time  1350    PT Time Calculation (min)  41 min    Equipment Utilized During Treatment  Gait belt    Activity Tolerance  Patient tolerated treatment well    Behavior During Therapy  WFL for tasks assessed/performed       Past Medical History:  Diagnosis Date  . Arthritis    right knee, left shoulder/ OSTEO  . Cancer ([facility])    skin   . Degenerative disc disease, lumbar    L4-L5  . Diverticulosis    ITIS  . History of hiatal hernia   . Hydronephrosis    right  . Hypercholesteremia   . Hypertension    CONTROLLED ON MEDS  . IBS (irritable bowel syndrome)   . Knee fracture, right 03/05/2015   DIFFICULTY WITH AMBULATION  . Osteopenia   . Ovarian cyst   . Pancreatic insufficiency   . Wears hearing aid    bilateral    Past Surgical History:  Procedure Laterality Date  . CATARACT EXTRACTION W/PHACO Right 11/02/2015   Procedure: CATARACT EXTRACTION PHACO AND INTRAOCULAR LENS PLACEMENT (IOC);  Surgeon: Leandrew Koyanagi, MD;  Location: [facility];  Service: Ophthalmology;  Laterality: Right;  . CATARACT EXTRACTION W/PHACO Left 07/18/2016   Procedure: CATARACT EXTRACTION PHACO AND INTRAOCULAR LENS PLACEMENT (IOC);  Surgeon: Leandrew Koyanagi, MD;  Location: [facility];  Service: Ophthalmology;  Laterality: Left;  . COLONOSCOPY    .  FRACTURE SURGERY Left    hip  . history of left hip fracture    . INTRAMEDULLARY (IM) NAIL INTERTROCHANTERIC Left 02/12/2017   Procedure: INTRAMEDULLARY (IM) NAIL INTERTROCHANTRIC;  Surgeon: Earnestine Leys, MD;  Location: [facility];  Service: Orthopedics;  Laterality: Left;  . kidney stent    . OOPHORECTOMY Bilateral 2015  . ORIF WRIST FRACTURE Right 04/08/2018   Procedure: OPEN REDUCTION INTERNAL FIXATION (ORIF) WRIST FRACTURE;  Surgeon: Altamese [place], MD;  Location: [facility];  Service: Orthopedics;  Laterality: Right;  . TONSILLECTOMY    . TOTAL KNEE ARTHROPLASTY Right 08/07/2017   Procedure: TOTAL KNEE ARTHROPLASTY;  Surgeon: Earnestine Leys, MD;  Location: [facility];  Service: Orthopedics;  Laterality: Right;    There were no vitals filed for this visit.  Subjective Assessment - 07/07/18 1311    Subjective  Not too bad today. Pt states standing for a while makes her L LE feel weak. Pt states seeing some improvement. Thinks walking is a little better in her house.     Pertinent History  Pt states being referred to PT for balance and gait. Gait and balance has been going dowhnill since December 2018 after her R TKA.  [facility] after being discharged from skilled nursing facility and fractured the head of her L femur. Did not have surgery due to  the fracture not being bad. Had a sycope episode March 2019. She was out all of a sudden. Had a total stroke work up and was within normal limits.  Was coming along alright. However, had another syncopal episode 04/03/2018. Pt was standing and talking to her neighbor. Does not remember the ambulance coming. She seemed like she had a stroke. Had a second stroke work up and the results were within normal limits. Took her longer to recover from her syncopal episode.  Currently taking plavix.  Currently taking the medication Kepra for seizzures. Pt states that when the episodes occur, there is no warning.   Had R wrist fracture ORIF on 04/08/2018.  Pt currently NWB R wrist.    Mainly uses her hurricain SPC.  Furniture walk at home. Also has a 3 wheeler and 4 wheeler rollator.  No falls other than due to her syncopal episodes. Pt states CPR can be performed if needed. Nothing long term. Can call ambulance if pt passes out or has a seizure. Neighbor was on her L side when talking to her prior to passing out.       Patient Stated Goals  Be able to walk better.    Currently in Pain?  Other (Comment)   no complain of pain.    Pain Onset  More than a month ago         [facility] PT Assessment - 07/07/18 1311      Observation/Other Assessments   Focus on Therapeutic Outcomes (FOTO)   lower leg FOTO 41      Strength   Right Hip Flexion  4+/5    Right Hip Extension  4+/5   seated manually resisted   Right Hip ABduction  4+/5   seated manually resisted clamshell   Left Hip Flexion  4+/5    Left Hip Extension  4+/5   seated manually resisted   Left Hip ABduction  4+/5   seated manually resisted clamshell   Right Knee Flexion  4+/5    Right Knee Extension  5/5    Left Knee Flexion  4/5    Left Knee Extension  5/5                           PT Education - 07/07/18 1402    Education Details  ther-ex, progress/current status with PT with pt.     Person(s) Educated  Patient    Methods  Explanation;Demonstration;Tactile cues;Verbal cues    Comprehension  Returned demonstration;Verbalized understanding      Objectives  No latex band allergies   MedbridgeAccess Code: [email or web address]   Gait: antalgic, decreased stance L LE, L femoral IR during L LE stance phase.   Ther-ex  Manually resisted hip abduction, hip extension, hip flexion, knee flexion, knee extension 1-2x each way for each LE.   Reviewed progress/current status with strength  Standing up from a chair, walking 10 ft forward, then returning 10 ft, then sitting back onto chair 3x With SPC: 15.66 seconds, 16.4 seconds, 16.13 seconds (16.06 seconds average)   Forward wedding  march 32 ft x 2 Side stepping 32 ft to the R and 32 ft to the L   Four square step exercise CGA without use of AD 5x each direction.   Gait around gym without use of SPC but pt holding her SPC 200 ft. L lateral trunk lean during L LE stance phase of gait. CGA. Cues for increased hip and knee flexion to  promote foot clearance towards last 75 ft.   LOB x1 during L LE stance phase with L lateral lean with independent recovery.   Reviewed plan of care: continue PT to promote strength, balance, and ability to ambulate   9 weeks for plan of care secondary to first available follow up appointment after next visit is in January 2020.    Improved exercise technique, movement at target joints, use of target muscles after min to mod verbal, visual, tactile cues.    Pt demonstrates improved overall LE strength, functional mobility (TUG times), and ability to ambulate since initial evaluation.  Pt able to ambulate up to 200 ft today without UE assist but LOB x 1 during L LE stance phase. Pt able to recover independently. Pt sill demonstrates bilateral LE weakness, decreased balance, and difficulty ambulating and performing functional tasks and would benefit from continued skilled physical therapy services to address the aforementioned deficits.         PT Short Term Goals - 07/07/18 1403      PT SHORT TERM GOAL #1   Title  Patient will be independent with her HEP to promote balance and decrease fall risk.     Baseline  Pt states performing her exercises at home (07/07/2018)    Time  3    Period  Weeks    Status  Achieved    Target Date  06/26/18        PT Long Term Goals - 07/07/18 1426      PT LONG TERM GOAL #1   Title  Patient will improve B LE strength by at least 1/2 MMT grade to promote ability to perform standing tasks with decreased fall risk.     Time  9    Period  Weeks    Status  Partially Met    Target Date  09/11/18      PT LONG TERM GOAL #2   Title  Patient will  improve TUG time to 12 seconds or less with use of SPC to promote functional mobility and decrease fall risk.     Baseline  18.4 seconds on average with SPC on L (06/03/2018); 16.06 seconds average with SPC (07/07/2018)    Time  9    Period  Weeks    Status  Partially Met    Target Date  09/11/18      PT LONG TERM GOAL #3   Title  Patient will improve FOTO score by at least 10 points as a demonstration of improved function.     Baseline  Lower Leg FOTO 40 (06/03/2018); 41 (07/07/2018)    Time  9    Period  Weeks    Status  On-going    Target Date  09/11/18      PT LONG TERM GOAL #4   Title  Patient will be able to ambulate 100 ft or more with SBA to independent to promote mobility.     Baseline  Pt currently ambulates with SPC (06/03/2018); pt able to ambulate 200 ft without using SPC CGA. LOB x 1 with independent recovery (07/07/2018)    Time  9    Period  Weeks    Status  Partially Met    Target Date  09/11/18            Plan - 07/07/18 1402    Clinical Impression Statement  Pt demonstrates improved overall LE strength, functional mobility (TUG times), and ability to ambulate since initial evaluation.  Pt able to  ambulate up to 200 ft today without UE assist but LOB x 1 during L LE stance phase. Pt able to recover independently. Pt sill demonstrates bilateral LE weakness, decreased balance, and difficulty ambulating and performing functional tasks and would benefit from continued skilled physical therapy services to address the aforementioned deficits.     History and Personal Factors relevant to plan of care:  age, medical history, falls due to syncopal episones which appear to come without warning, decreased strength, LE weakness, difficulty walking, difficulty performing standing tasks unassisted.     Clinical Presentation  Stable    Clinical Presentation due to:  pt making progress with strength and gait.     Rehab Potential  Fair    Clinical Impairments Affecting Rehab  Potential  (-) age, hx of falls, weakness, medical history; (+) motivated    PT Frequency  2x / week    PT Duration  Other (comment)   9 weeks   PT Treatment/Interventions  Therapeutic activities;Therapeutic exercise;Neuromuscular re-education;Balance training;Patient/family education;Manual techniques;[place] needling;Aquatic Therapy;Gait training    PT Next Visit Plan  trunk, B hip and LE strengthening, balance, gait, manual techniques and modalities PRN.     Consulted and Agree with Plan of Care  Patient       Patient will benefit from skilled therapeutic intervention in order to improve the following deficits and impairments:  Postural dysfunction, Improper body mechanics, Difficulty walking, Decreased strength, Decreased balance, Abnormal gait  Visit Diagnosis: Muscle weakness (generalized) - Plan: PT plan of care cert/re-cert  History of falling - Plan: PT plan of care cert/re-cert  Difficulty in walking, not elsewhere classified - Plan: PT plan of care cert/re-cert     Problem List Patient Active Problem List   Diagnosis Date Noted  . S/P ORIF (open reduction internal fixation) fracture radius and ulnar fx 04/08/18 04/10/2018  . Distal radius and ulna fracture, right   . History of CVA (cerebrovascular accident)   . Benign essential HTN   . Tachycardia   . Leukocytosis   . Seizures ([place])   . Stroke-like episode ([place]) s/p IV tPA 04/03/2018  . Syncope   . TIA (transient ischemic attack) 11/04/2017  . Malnutrition of moderate degree 08/26/2017  . Total knee replacement status 08/07/2017  . Closed left hip fracture ([place]) 02/12/2017  . HTN (hypertension) 02/12/2017  . HLD (hyperlipidemia) 02/12/2017    Thank you for your referral.  Joneen Boers PT, DPT   07/07/2018, 3:01 PM  [facility] [address] [place], [address] Phone: [phone]   Fax:  [phone]  Name: Kellie Harris MRN:  [id number] Date of Birth: 07-16-1931

## 2018-07-08 DIAGNOSIS — M25551 Pain in right hip: Secondary | ICD-10-CM | POA: Diagnosis not present

## 2018-07-08 DIAGNOSIS — R42 Dizziness and giddiness: Secondary | ICD-10-CM | POA: Diagnosis not present

## 2018-07-08 DIAGNOSIS — M25519 Pain in unspecified shoulder: Secondary | ICD-10-CM | POA: Diagnosis not present

## 2018-07-08 DIAGNOSIS — R569 Unspecified convulsions: Secondary | ICD-10-CM | POA: Diagnosis not present

## 2018-07-08 DIAGNOSIS — G459 Transient cerebral ischemic attack, unspecified: Secondary | ICD-10-CM | POA: Diagnosis not present

## 2018-07-08 DIAGNOSIS — M25552 Pain in left hip: Secondary | ICD-10-CM | POA: Diagnosis not present

## 2018-07-09 ENCOUNTER — Ambulatory Visit: Payer: PPO

## 2018-07-09 DIAGNOSIS — R262 Difficulty in walking, not elsewhere classified: Secondary | ICD-10-CM

## 2018-07-09 DIAGNOSIS — Z9181 History of falling: Secondary | ICD-10-CM

## 2018-07-09 DIAGNOSIS — M6281 Muscle weakness (generalized): Secondary | ICD-10-CM

## 2018-07-09 NOTE — Therapy (Signed)
[facility] [address] [place], [address] Phone: [phone]   Fax:  [phone]  Physical Therapy Treatment  Patient Details  Name: Kellie Harris MRN: [id number] Date of Birth: 03-28-1931 Referring Provider (PT): Antony Contras, MD   Encounter Date: 07/09/2018  PT End of Session - 07/09/18 1438    Visit Number  10    Number of Visits  35    Date for PT Re-Evaluation  09/11/18    Authorization Type  1    Authorization Time Period  of 10 progress report    PT Start Time  1438    PT Stop Time  1519    PT Time Calculation (min)  41 min    Equipment Utilized During Treatment  Gait belt    Activity Tolerance  Patient tolerated treatment well    Behavior During Therapy  WFL for tasks assessed/performed       Past Medical History:  Diagnosis Date  . Arthritis    right knee, left shoulder/ OSTEO  . Cancer ([place])    skin   . Degenerative disc disease, lumbar    L4-L5  . Diverticulosis    ITIS  . History of hiatal hernia   . Hydronephrosis    right  . Hypercholesteremia   . Hypertension    CONTROLLED ON MEDS  . IBS (irritable bowel syndrome)   . Knee fracture, right 03/05/2015   DIFFICULTY WITH AMBULATION  . Osteopenia   . Ovarian cyst   . Pancreatic insufficiency   . Wears hearing aid    bilateral    Past Surgical History:  Procedure Laterality Date  . CATARACT EXTRACTION W/PHACO Right 11/02/2015   Procedure: CATARACT EXTRACTION PHACO AND INTRAOCULAR LENS PLACEMENT (IOC);  Surgeon: Leandrew Koyanagi, MD;  Location: [facility];  Service: Ophthalmology;  Laterality: Right;  . CATARACT EXTRACTION W/PHACO Left 07/18/2016   Procedure: CATARACT EXTRACTION PHACO AND INTRAOCULAR LENS PLACEMENT (IOC);  Surgeon: Leandrew Koyanagi, MD;  Location: [facility];  Service: Ophthalmology;  Laterality: Left;  . COLONOSCOPY    . FRACTURE SURGERY Left    hip  . history of left hip fracture     . INTRAMEDULLARY (IM) NAIL INTERTROCHANTERIC Left 02/12/2017   Procedure: INTRAMEDULLARY (IM) NAIL INTERTROCHANTRIC;  Surgeon: Earnestine Leys, MD;  Location: [facility];  Service: Orthopedics;  Laterality: Left;  . kidney stent    . OOPHORECTOMY Bilateral 2015  . ORIF WRIST FRACTURE Right 04/08/2018   Procedure: OPEN REDUCTION INTERNAL FIXATION (ORIF) WRIST FRACTURE;  Surgeon: Altamese [place], MD;  Location: [facility];  Service: Orthopedics;  Laterality: Right;  . TONSILLECTOMY    . TOTAL KNEE ARTHROPLASTY Right 08/07/2017   Procedure: TOTAL KNEE ARTHROPLASTY;  Surgeon: Earnestine Leys, MD;  Location: [facility];  Service: Orthopedics;  Laterality: Right;    There were no vitals filed for this visit.  Subjective Assessment - 07/09/18 1441    Subjective  Saw her nurse practitioner yesterday who says that she does not think pt had a seizure. Going to get a 4 hour EEG. No feeling of fainting or syncope for the past few weeks.   Also has an opthamology appointment in January for her eyes.   Hopes walking is getting better. Still not a really pretty walk.  L hip has been bothering her this morning.  Pt states that her wrist is WBAT.     Pertinent History  Pt states being referred to PT for balance and gait.  Gait and balance has been going dowhnill since December 2018 after her R TKA.  [facility] after being discharged from skilled nursing facility and fractured the head of her L femur. Did not have surgery due to the fracture not being bad. Had a sycope episode March 2019. She was out all of a sudden. Had a total stroke work up and was within normal limits.  Was coming along alright. However, had another syncopal episode 04/03/2018. Pt was standing and talking to her neighbor. Does not remember the ambulance coming. She seemed like she had a stroke. Had a second stroke work up and the results were within normal limits. Took her longer to recover from her syncopal episode.  Currently taking plavix.  Currently taking the  medication Kepra for seizzures. Pt states that when the episodes occur, there is no warning.   Had R wrist fracture ORIF on 04/08/2018.  Pt currently NWB R wrist.   Mainly uses her hurricain SPC.  Furniture walk at home. Also has a 3 wheeler and 4 wheeler rollator.  No falls other than due to her syncopal episodes. Pt states CPR can be performed if needed. Nothing long term. Can call ambulance if pt passes out or has a seizure. Neighbor was on her L side when talking to her prior to passing out.       Patient Stated Goals  Be able to walk better.    Currently in Pain?  Yes    Pain Score  2    L hip   Pain Onset  More than a month ago                               PT Education - 07/09/18 1503    Education Details  ther-ex    Person(s) Educated  Patient    Methods  Explanation;Demonstration;Tactile cues;Verbal cues    Comprehension  Returned demonstration;Verbalized understanding      Objectives  No latex band allergies   MedbridgeAccess Code: [email or web address]   Gait: antalgic, decreased stance L LE, L femoral IR during L LE stance phase.   Ther-ex  Side stepping without using SPC 32 ft to the R and 32 ft to the L, then with SPC 15 ft to the R and 15 ft to the L  Seated gentle manually resisted trunk flexion isometrics at neutral 10x5 seconds for 3 sets  Seated gentle manual trunk perturbation by PT at neutral posture 1 minute x 3  Pt states decreased L hip pain after aforementioned trunk exercises.   Forward wedding march 32 ft x 2 CGA without use of SPC  Then with use of rw to see if pt can perform at home 60f x 2. Pt able to perform safely  Sit <> stand from regular chair with emphasis on femoral control  5x to promote LE strengthening   Improved exercise technique, movement at target joints, use of target muscles after min to mod verbal, visual, tactile cues.    No L hip pain following exercises to promote trunk strengthening. Continued  working on trunk and hip strengthening to promote ability to ambulate more safely and decrease lateral lean.      PT Short Term Goals - 07/07/18 1403      PT SHORT TERM GOAL #1   Title  Patient will be independent with her HEP to promote balance and decrease fall risk.     Baseline  Pt states performing her  exercises at home (07/07/2018)    Time  3    Period  Weeks    Status  Achieved    Target Date  06/26/18        PT Long Term Goals - 07/07/18 1426      PT LONG TERM GOAL #1   Title  Patient will improve B LE strength by at least 1/2 MMT grade to promote ability to perform standing tasks with decreased fall risk.     Time  9    Period  Weeks    Status  Partially Met    Target Date  09/11/18      PT LONG TERM GOAL #2   Title  Patient will improve TUG time to 12 seconds or less with use of SPC to promote functional mobility and decrease fall risk.     Baseline  18.4 seconds on average with SPC on L (06/03/2018); 16.06 seconds average with SPC (07/07/2018)    Time  9    Period  Weeks    Status  Partially Met    Target Date  09/11/18      PT LONG TERM GOAL #3   Title  Patient will improve FOTO score by at least 10 points as a demonstration of improved function.     Baseline  Lower Leg FOTO 40 (06/03/2018); 41 (07/07/2018)    Time  9    Period  Weeks    Status  On-going    Target Date  09/11/18      PT LONG TERM GOAL #4   Title  Patient will be able to ambulate 100 ft or more with SBA to independent to promote mobility.     Baseline  Pt currently ambulates with SPC (06/03/2018); pt able to ambulate 200 ft without using SPC CGA. LOB x 1 with independent recovery (07/07/2018)    Time  9    Period  Weeks    Status  Partially Met    Target Date  09/11/18            Plan - 07/09/18 1503    Clinical Impression Statement  No L hip pain following exercises to promote trunk strengthening. Continued working on trunk and hip strengthening to promote ability to ambulate  more safely and decrease lateral lean.     Rehab Potential  Fair    Clinical Impairments Affecting Rehab Potential  (-) age, hx of falls, weakness, medical history; (+) motivated    PT Frequency  2x / week    PT Duration  Other (comment)   9 weeks   PT Treatment/Interventions  Therapeutic activities;Therapeutic exercise;Neuromuscular re-education;Balance training;Patient/family education;Manual techniques;[place] needling;Aquatic Therapy;Gait training    PT Next Visit Plan  trunk, B hip and LE strengthening, balance, gait, manual techniques and modalities PRN.     Consulted and Agree with Plan of Care  Patient       Patient will benefit from skilled therapeutic intervention in order to improve the following deficits and impairments:  Postural dysfunction, Improper body mechanics, Difficulty walking, Decreased strength, Decreased balance, Abnormal gait  Visit Diagnosis: Muscle weakness (generalized)  History of falling  Difficulty in walking, not elsewhere classified     Problem List Patient Active Problem List   Diagnosis Date Noted  . S/P ORIF (open reduction internal fixation) fracture radius and ulnar fx 04/08/18 04/10/2018  . Distal radius and ulna fracture, right   . History of CVA (cerebrovascular accident)   . Benign essential HTN   .  Tachycardia   . Leukocytosis   . Seizures ([place])   . Stroke-like episode ([place]) s/p IV tPA 04/03/2018  . Syncope   . TIA (transient ischemic attack) 11/04/2017  . Malnutrition of moderate degree 08/26/2017  . Total knee replacement status 08/07/2017  . Closed left hip fracture ([place]) 02/12/2017  . HTN (hypertension) 02/12/2017  . HLD (hyperlipidemia) 02/12/2017   Joneen Boers PT, DPT   07/09/2018, 6:31 PM  [facility] [address] [place], [address] Phone: [phone]   Fax:  [phone]  Name: Kellie Harris MRN: [id number] Date of Birth: 1930/10/01

## 2018-07-09 NOTE — Patient Instructions (Signed)
Gave wedding march using rw for safety as part of her HEP. Pt demonstrated and verbalized understanding.

## 2018-07-11 ENCOUNTER — Other Ambulatory Visit: Payer: Self-pay | Admitting: *Deleted

## 2018-07-11 NOTE — Patient Outreach (Signed)
[facility]) Care Management  07/11/2018  Kellie Harris 11/04/1930 [id number]   Telephone follow up call    Patient with recent Hospital admission at [facility], 8/22-8/29 with stroke like episode, fall, fracture of right radius/ulnar and ORIF [facility] stay 8/29 - 9/13.  PMHx: includes but not limited to HTN, HLD,IBS, TIA , left hip fracture  Successful outreach call to patient . Patient discussed making slow steady progress.  She is discussed continuing with outpatient therapy and therapist pleased with her progress.  Patient reports still having personal care worker a few hours each day, she is considering cutting back on that as she gets stronger, patient reports she is beginning to do some light house work just trying to rest in between.   Seizure Patient discussed recent visit to neurology office, she denies any new seizure activity or "spells", patient is considering doing a 4 hour eeg procedure as recommended.   Hypertension Patient reports her blood pressure have been doing pretty good, less than 140/90. She continues to limit salt in her diet. Reports tolerating foods okay , stating she has never been a big eater, denies any weight loss.   Patient denies any new concerns agreeable to follow up call in the next month.  Patient has office visit with PCP in the next week.  Plan  Will plan follow up call in the next month.  THN CM Care Plan Problem One     Most Recent Value  Care Plan Problem One  Recent hospital admission related to stroke symptoms, right wrist surgery , at risk for readmission .   Role Documenting the Problem One  Care Management [place] for Problem One  Active  THN Long Term Goal   Patient will not experience a hospital admission within the next 90  days  [goal extended ]  THN Long Term Goal Start Date  04/28/18  Interventions for Problem One Long Term Goal  Reviewed current clinical state, discussed for any new symptoms    THN CM Short Term Goal #1   Patient will attend medical appointment in the next 30  days   THN CM Short Term Goal #1 Start Date  04/28/18  [facility] CM Short Term Goal #1 Met Date  05/27/18  THN CM Short Term Goal #2   Patietn will be able to report increase in strength and balance over the next 30 days   THN CM Short Term Goal #2 Start Date  06/25/18 [goal restarted. ]  Interventions for Short Term Goal #2  Discussed patient progress as continuing with outpt therapy , and resting between activities .   THN CM Short Term Goal #3  Patient will be able to report no falls in the next 30 days   THN CM Short Term Goal #3 Start Date  05/27/18  [facility] CM Short Term Goal #3 Met Date  07/11/18    [facility] CM Care Plan Problem Two     Most Recent Value  Care Plan Problem Two  Knowledge related to Self care managment of Hypertension   Role Documenting the Problem Two  Care Management Coordinator  Care Plan for Problem Two  Active  Interventions for Problem Two Long Term Goal   Reviewed recent reading, reinforced notifying MD of abnormal readings for her and  any symptoms of dizziness   THN Long Term Goal  Patient will be able to verbalize 2 measures of self care management of Hypertension over the next 31 days  THN Long Term Goal Start Date  06/25/18  THN CM Short Term Goal #1   Patient will be able to report monitoring blood pressure at least 3 days a week keeping a record over the next 30 days   THN CM Short Term Goal #1 Start Date  06/25/18  Interventions for Short Term Goal #2   encouraged continuing to monitor readings.   THN CM Short Term Goal #2   Patient will discuss how salt foods to limit over the next 30 days   THN CM Short Term Goal #2 Start Date  06/25/18  Interventions for Short Term Goal #2  Reviewed low salt daily limits and food high in salt to limits and seasoning choices       [name], RN, [place] Management Coordinator  [phone]- Mobile [phone]-  [place] Office

## 2018-07-15 ENCOUNTER — Ambulatory Visit: Payer: PPO

## 2018-07-16 DIAGNOSIS — Z0000 Encounter for general adult medical examination without abnormal findings: Secondary | ICD-10-CM | POA: Diagnosis not present

## 2018-07-16 DIAGNOSIS — I10 Essential (primary) hypertension: Secondary | ICD-10-CM | POA: Diagnosis not present

## 2018-07-16 DIAGNOSIS — I499 Cardiac arrhythmia, unspecified: Secondary | ICD-10-CM | POA: Diagnosis not present

## 2018-07-16 DIAGNOSIS — E7800 Pure hypercholesterolemia, unspecified: Secondary | ICD-10-CM | POA: Diagnosis not present

## 2018-07-17 DIAGNOSIS — R569 Unspecified convulsions: Secondary | ICD-10-CM | POA: Diagnosis not present

## 2018-07-17 DIAGNOSIS — E7800 Pure hypercholesterolemia, unspecified: Secondary | ICD-10-CM | POA: Diagnosis not present

## 2018-07-17 DIAGNOSIS — I10 Essential (primary) hypertension: Secondary | ICD-10-CM | POA: Diagnosis not present

## 2018-07-17 DIAGNOSIS — G459 Transient cerebral ischemic attack, unspecified: Secondary | ICD-10-CM | POA: Diagnosis not present

## 2018-07-17 DIAGNOSIS — Z8673 Personal history of transient ischemic attack (TIA), and cerebral infarction without residual deficits: Secondary | ICD-10-CM | POA: Diagnosis not present

## 2018-07-17 DIAGNOSIS — R42 Dizziness and giddiness: Secondary | ICD-10-CM | POA: Diagnosis not present

## 2018-07-28 ENCOUNTER — Other Ambulatory Visit: Payer: Self-pay | Admitting: *Deleted

## 2018-07-28 NOTE — Patient Outreach (Signed)
[facility]) Care Management  07/28/2018  Kellie Harris Nov 06, 1930 [id number]  Telephone follow up call   Patient with recent Hospital admission at [facility], 8/22-8/29 with stroke like episode, fall, fracture of right radius/ulnar and ORIF [facility] stay 8/29 - 9/13.  PMHx: includes but not limited to HTN, HLD,IBS, TIA , left hip fracture  Unsuccessful telephone outreach call to patient home and mobile number, able to leave a HIPAA compliant message for return call   Plan  Will await return call , if no response will plan return call in the next 4 business days.    [name], RN, [place] Management Coordinator  [phone]- Mobile [phone]- Toll Free Main Office

## 2018-07-30 ENCOUNTER — Ambulatory Visit: Payer: PPO | Attending: Internal Medicine

## 2018-07-30 ENCOUNTER — Other Ambulatory Visit: Payer: Self-pay | Admitting: *Deleted

## 2018-07-30 DIAGNOSIS — R262 Difficulty in walking, not elsewhere classified: Secondary | ICD-10-CM | POA: Insufficient documentation

## 2018-07-30 DIAGNOSIS — M6281 Muscle weakness (generalized): Secondary | ICD-10-CM | POA: Diagnosis not present

## 2018-07-30 DIAGNOSIS — Z9181 History of falling: Secondary | ICD-10-CM | POA: Diagnosis not present

## 2018-07-30 NOTE — Therapy (Signed)
[place] [facility] [address] [place], [address] Phone: [phone]   Fax:  [phone]  Physical Therapy Treatment  Patient Details  Name: Kellie Harris MRN: [id number] Date of Birth: [birth year] Referring Provider (PT): Antony Contras, MD   Encounter Date: 07/30/2018  PT End of Session - 07/30/18 1305    Visit Number  11    Number of Visits  35    Date for PT Re-Evaluation  09/11/18    Authorization Type  2    Authorization Time Period  of 10 progress report    PT Start Time  1305    PT Stop Time  1348    PT Time Calculation (min)  43 min    Equipment Utilized During Treatment  Gait belt    Activity Tolerance  Patient tolerated treatment well    Behavior During Therapy  WFL for tasks assessed/performed       Past Medical History:  Diagnosis Date  . Arthritis    right knee, left shoulder/ OSTEO  . Cancer ([place])    skin   . Degenerative disc disease, lumbar    L4-L5  . Diverticulosis    ITIS  . History of hiatal hernia   . Hydronephrosis    right  . Hypercholesteremia   . Hypertension    CONTROLLED ON MEDS  . IBS (irritable bowel syndrome)   . Knee fracture, right 03/05/2015   DIFFICULTY WITH AMBULATION  . Osteopenia   . Ovarian cyst   . Pancreatic insufficiency   . Wears hearing aid    bilateral    Past Surgical History:  Procedure Laterality Date  . CATARACT EXTRACTION W/PHACO Right 11/02/2015   Procedure: CATARACT EXTRACTION PHACO AND INTRAOCULAR LENS PLACEMENT (IOC);  Surgeon: Leandrew Koyanagi, MD;  Location: [place];  Service: Ophthalmology;  Laterality: Right;  . CATARACT EXTRACTION W/PHACO Left 07/18/2016   Procedure: CATARACT EXTRACTION PHACO AND INTRAOCULAR LENS PLACEMENT (IOC);  Surgeon: Leandrew Koyanagi, MD;  Location: [place];  Service: Ophthalmology;  Laterality: Left;  . COLONOSCOPY    . FRACTURE SURGERY Left    hip  . history of left hip fracture     . INTRAMEDULLARY (IM) NAIL INTERTROCHANTERIC Left 02/12/2017   Procedure: INTRAMEDULLARY (IM) NAIL INTERTROCHANTRIC;  Surgeon: Earnestine Leys, MD;  Location: [facility];  Service: Orthopedics;  Laterality: Left;  . kidney stent    . OOPHORECTOMY Bilateral 2015  . ORIF WRIST FRACTURE Right 04/08/2018   Procedure: OPEN REDUCTION INTERNAL FIXATION (ORIF) WRIST FRACTURE;  Surgeon: Altamese Talco, MD;  Location: [place];  Service: Orthopedics;  Laterality: Right;  . TONSILLECTOMY    . TOTAL KNEE ARTHROPLASTY Right 08/07/2017   Procedure: TOTAL KNEE ARTHROPLASTY;  Surgeon: Earnestine Leys, MD;  Location: [facility];  Service: Orthopedics;  Laterality: Right;    There were no vitals filed for this visit.  Subjective Assessment - 07/30/18 1307    Subjective  Feels discomfort both shoulders. Wonders if she hurt her R shoulder at her last fall.  No falls since PT.  Went to PCP.  Pulse was irregular, not afib. Possibly PVCs, mainly electrical, not like an artery block.  Saw her cardiologist who said that her EKG is fine.  Doctor mentioned putting a monotor in her skin to monitor her heart.  Getting an extended EEG for 4 hours.     Pertinent History  Pt states being referred to PT for balance and gait. Gait and balance has been  going dowhnill since December 2018 after her R TKA.  [place] after being discharged from skilled nursing facility and fractured the head of her L femur. Did not have surgery due to the fracture not being bad. Had a sycope episode March 2019. She was out all of a sudden. Had a total stroke work up and was within normal limits.  Was coming along alright. However, had another syncopal episode 04/03/2018. Pt was standing and talking to her neighbor. Does not remember the ambulance coming. She seemed like she had a stroke. Had a second stroke work up and the results were within normal limits. Took her longer to recover from her syncopal episode.  Currently taking plavix.  Currently taking the medication  Kepra for seizzures. Pt states that when the episodes occur, there is no warning.   Had R wrist fracture ORIF on 04/08/2018.  Pt currently NWB R wrist.   Mainly uses her hurricain SPC.  Furniture walk at home. Also has a 3 wheeler and 4 wheeler rollator.  No falls other than due to her syncopal episodes. Pt states CPR can be performed if needed. Nothing long term. Can call ambulance if pt passes out or has a seizure. Neighbor was on her L side when talking to her prior to passing out.       Patient Stated Goals  Be able to walk better.    Currently in Pain?  Yes    Pain Score  --   no pain level provided   Pain Onset  More than a month ago                               PT Education - 07/30/18 1330    Education Details  ther-ex    [employer]) Educated  Patient    Methods  Explanation;Demonstration;Tactile cues;Verbal cues    Comprehension  Returned demonstration;Verbalized understanding      Objectives  No latex band allergies   MedbridgeAccess Code: [email or web address]   Gait: antalgic, decreased stance L LE, L femoral IR during L LE stance phase.  No pacemakers or defibrillators   Ther-ex  (+) empty can, Michel Bickers, [place] tests. (+) Neers L shoulder  Sit <> stand from regular chair with emphasis on femoral control  5x to promote LE strengthening  Side stepping with B UE assist at treadmill bars 5x   Side stepping without using SPC 32 ft to the R and 32 ft to the L, CGA. L hip joint discomfort  Forward wedding march 32 ft CGA. L hip joint discomfort  Standing L hip abduction resisting 2 lbs with B UE assist 10x3   Seated gentle manually resisted trunk flexion isometrics at neutral 10x5 seconds for 3 sets to promote trunk control during gait  Standing B shoulder extension resisting yellow band with scapular retraction   10x5 seconds to promote trunk and scapular strength (for shoulders) for 3 sets  Pt states shoulders feel better after the  last 2 exercises.   Seated clamshells yellow band, hips less than 90 degrees flexion 10x3  Standing straight pallof press resisting double yellow band 10x5 seconds for 2 sets  Decreased L hip pain after session.    Improved exercise technique, movement at target joints, use of target muscles after mod verbal, visual, tactile cues.    Decreased B shoulder pain with exercises to promote scapular strengthening. Continued working on glute and trunk, and LE strengthening to promote ability to  perform standing tasks with less difficulty.           PT Short Term Goals - 07/07/18 1403      PT SHORT TERM GOAL #1   Title  Patient will be independent with her HEP to promote balance and decrease fall risk.     Baseline  Pt states performing her exercises at home (07/07/2018)    Time  3    Period  Weeks    Status  Achieved    Target Date  06/26/18        PT Long Term Goals - 07/07/18 1426      PT LONG TERM GOAL #1   Title  Patient will improve B LE strength by at least 1/2 MMT grade to promote ability to perform standing tasks with decreased fall risk.     Time  9    Period  Weeks    Status  Partially Met    Target Date  09/11/18      PT LONG TERM GOAL #2   Title  Patient will improve TUG time to 12 seconds or less with use of SPC to promote functional mobility and decrease fall risk.     Baseline  18.4 seconds on average with SPC on L (06/03/2018); 16.06 seconds average with SPC (07/07/2018)    Time  9    Period  Weeks    Status  Partially Met    Target Date  09/11/18      PT LONG TERM GOAL #3   Title  Patient will improve FOTO score by at least 10 points as a demonstration of improved function.     Baseline  Lower Leg FOTO 40 (06/03/2018); 41 (07/07/2018)    Time  9    Period  Weeks    Status  On-going    Target Date  09/11/18      PT LONG TERM GOAL #4   Title  Patient will be able to ambulate 100 ft or more with SBA to independent to promote mobility.     Baseline   Pt currently ambulates with SPC (06/03/2018); pt able to ambulate 200 ft without using SPC CGA. LOB x 1 with independent recovery (07/07/2018)    Time  9    Period  Weeks    Status  Partially Met    Target Date  09/11/18            Plan - 07/30/18 1305    Clinical Impression Statement  Decreased B shoulder pain with exercises to promote scapular strengthening. Continued working on glute and trunk, and LE strengthening to promote ability to perform standing tasks with less difficulty.     Rehab Potential  Fair    Clinical Impairments Affecting Rehab Potential  (-) age, hx of falls, weakness, medical history; (+) motivated    PT Frequency  2x / week    PT Duration  Other (comment)   9 weeks   PT Treatment/Interventions  Therapeutic activities;Therapeutic exercise;Neuromuscular re-education;Balance training;Patient/family education;Manual techniques;Disney needling;Aquatic Therapy;Gait training    PT Next Visit Plan  trunk, B hip and LE strengthening, balance, gait, manual techniques and modalities PRN.     Consulted and Agree with Plan of Care  Patient       Patient will benefit from skilled therapeutic intervention in order to improve the following deficits and impairments:  Postural dysfunction, Improper body mechanics, Difficulty walking, Decreased strength, Decreased balance, Abnormal gait  Visit Diagnosis: Muscle weakness (generalized)  History of falling  Difficulty in walking, not elsewhere classified     Problem List Patient Active Problem List   Diagnosis Date Noted  . S/P ORIF (open reduction internal fixation) fracture radius and ulnar fx 04/08/18 04/10/2018  . Distal radius and ulna fracture, right   . History of CVA (cerebrovascular accident)   . Benign essential HTN   . Tachycardia   . Leukocytosis   . Seizures ([place])   . Stroke-like episode ([place]) s/p IV tPA 04/03/2018  . Syncope   . TIA (transient ischemic attack) 11/04/2017  . Malnutrition of moderate  degree 08/26/2017  . Total knee replacement status 08/07/2017  . Closed left hip fracture ([place]) 02/12/2017  . HTN (hypertension) 02/12/2017  . HLD (hyperlipidemia) 02/12/2017   Joneen Boers PT, DPT   07/30/2018, 1:59 PM  [place] [facility] [address], [place], [address] Phone: [phone]   Fax:  [phone]  Name: Kellie Harris MRN: [id number] Date of Birth: 02-06-1931

## 2018-07-30 NOTE — Patient Outreach (Signed)
[place] [facility]) Care Management  07/30/2018  Kellie Harris Para Jan 20, 1931 [id number]   Telephone follow up call    Patient with recent Hospital admission at [facility], 8/22-8/29 with stroke like episode, fall, fracture of right radius/ulnar and [facility] stay 8/29 - 9/13.  PMHx: includes but not limited to HTN, HLD,IBS, TIA , left hip fracture  Successful outreach call to patient, she discussed getting along pretty good.  Patient discussed that she continue to attend out patient physical therapy.  She discussed still having personal care assistant available 5 days a week,patient states that she has not been cleared to return to driving yet.  Patient discussed that she is tolerating mobility around her home without problems using her rolllator or cane, patient discussed getting a new smaller type rollator.  She further discussed : Hypertension  Patient reports recent visit to PCP,and got a good report,labs where perfect, nothing to explain why she is tired a lot . She noted MD stated her heart beat was a little irregular and EKG, in sinus rhythm, no atrial fib .  Patient discussed she also had a visit with cardiology for follow up on the next day, she shared that she plans follow up in March consideration of   Loop recorder  implantation for further evaluation she states she hasn't made up her mind whether that is what she wants to do.  Patient discussed she continues to monitor her blood pressure at home and readings or less than 140/80 and also her blood pressure is checked at her therapy visit.   Stroke like symptoms   No further symptoms of dizziness, syncope. Patient discussed that she has a follow up visit with neurology for a 3 hour EEG in January and hoping that will provide more information.   Recent Right wrist ORIF Completed outpatient therapy related to wrist, continued to attend therapy for strengthening and balance reports attending about twice weekly.   Patient discussed some increase in tolerance of completing some light housekeeping in home, takes her times as she tires easily. Patient uses walker and cane, she discussed some of her balance issues may be related to history of left  Hip fracture and right TKA,  Denies recent falls, states family arranged medical alert system.    Discussed additional care, care coordination needs patient reports that she is managing well at home, she has Psychologist, sport and exercise and hoping to be able to decrease hours that she comes as she gets stronger. Patient hopeful to be cleared to drive after  further neurology test next year and as she gets stronger.  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 Patient declines transition to health coach.  Reinforced with patient to contact [facility] if future needs arise, she is agreeable.   Plan Will close case , care goal met Will send PCP case closure letter.   [name], RN, [place] Management Coordinator  [phone] Mobile [phone]- Toll Free Main Office

## 2018-08-08 ENCOUNTER — Other Ambulatory Visit: Payer: Self-pay

## 2018-08-08 NOTE — Patient Outreach (Signed)
First attempt to obtain mRs. No answer. No voicemail. Will try again later.

## 2018-08-19 ENCOUNTER — Ambulatory Visit: Payer: PPO | Attending: Internal Medicine

## 2018-08-19 DIAGNOSIS — M6281 Muscle weakness (generalized): Secondary | ICD-10-CM

## 2018-08-19 DIAGNOSIS — Z9181 History of falling: Secondary | ICD-10-CM | POA: Diagnosis not present

## 2018-08-19 DIAGNOSIS — R262 Difficulty in walking, not elsewhere classified: Secondary | ICD-10-CM | POA: Diagnosis not present

## 2018-08-19 NOTE — Therapy (Signed)
[facility] [address] [place], [address] Phone: [phone]   Fax:  [phone]  Physical Therapy Treatment  Patient Details  Name: Kellie Harris MRN: [id number] Date of Birth: 10/15/1930 Referring Provider (PT): Antony Contras, MD   Encounter Date: 08/19/2018  PT End of Session - 08/19/18 1309    Visit Number  12    Number of Visits  35    Date for PT Re-Evaluation  09/11/18    Authorization Type  3    Authorization Time Period  of 10 progress report    PT Start Time  1309   pt arrived late   PT Stop Time  1350    PT Time Calculation (min)  41 min    Equipment Utilized During Treatment  Gait belt    Activity Tolerance  Patient tolerated treatment well    Behavior During Therapy  WFL for tasks assessed/performed       Past Medical History:  Diagnosis Date  . Arthritis    right knee, left shoulder/ OSTEO  . Cancer ([place])    skin   . Degenerative disc disease, lumbar    L4-L5  . Diverticulosis    ITIS  . History of hiatal hernia   . Hydronephrosis    right  . Hypercholesteremia   . Hypertension    CONTROLLED ON MEDS  . IBS (irritable bowel syndrome)   . Knee fracture, right 03/05/2015   DIFFICULTY WITH AMBULATION  . Osteopenia   . Ovarian cyst   . Pancreatic insufficiency   . Wears hearing aid    bilateral    Past Surgical History:  Procedure Laterality Date  . CATARACT EXTRACTION W/PHACO Right 11/02/2015   Procedure: CATARACT EXTRACTION PHACO AND INTRAOCULAR LENS PLACEMENT (IOC);  Surgeon: Leandrew Koyanagi, MD;  Location: [facility];  Service: Ophthalmology;  Laterality: Right;  . CATARACT EXTRACTION W/PHACO Left 07/18/2016   Procedure: CATARACT EXTRACTION PHACO AND INTRAOCULAR LENS PLACEMENT (IOC);  Surgeon: Leandrew Koyanagi, MD;  Location: [facility];  Service: Ophthalmology;  Laterality: Left;  . COLONOSCOPY    . FRACTURE SURGERY Left    hip  . history of left  hip fracture    . INTRAMEDULLARY (IM) NAIL INTERTROCHANTERIC Left 02/12/2017   Procedure: INTRAMEDULLARY (IM) NAIL INTERTROCHANTRIC;  Surgeon: Earnestine Leys, MD;  Location: [facility];  Service: Orthopedics;  Laterality: Left;  . kidney stent    . OOPHORECTOMY Bilateral 2015  . ORIF WRIST FRACTURE Right 04/08/2018   Procedure: OPEN REDUCTION INTERNAL FIXATION (ORIF) WRIST FRACTURE;  Surgeon: Altamese Duck, MD;  Location: [facility];  Service: Orthopedics;  Laterality: Right;  . TONSILLECTOMY    . TOTAL KNEE ARTHROPLASTY Right 08/07/2017   Procedure: TOTAL KNEE ARTHROPLASTY;  Surgeon: Earnestine Leys, MD;  Location: [facility];  Service: Orthopedics;  Laterality: Right;    There were no vitals filed for this visit.  Subjective Assessment - 08/19/18 1311    Subjective  No falls recently. No syncope. Strength is still weak. The L leg is definitely weaker I think. Shoulder felt better after last session.     Pertinent History  Pt states being referred to PT for balance and gait. Gait and balance has been going dowhnill since December 2018 after her R TKA.  [facility] after being discharged from skilled nursing facility and fractured the head of her L femur. Did not have surgery due to the fracture not being bad. Had a sycope episode March 2019. She  was out all of a sudden. Had a total stroke work up and was within normal limits.  Was coming along alright. However, had another syncopal episode 04/03/2018. Pt was standing and talking to her neighbor. Does not remember the ambulance coming. She seemed like she had a stroke. Had a second stroke work up and the results were within normal limits. Took her longer to recover from her syncopal episode.  Currently taking plavix.  Currently taking the medication Kepra for seizzures. Pt states that when the episodes occur, there is no warning.   Had R wrist fracture ORIF on 04/08/2018.  Pt currently NWB R wrist.   Mainly uses her hurricain SPC.  Furniture walk at home. Also has a 3  wheeler and 4 wheeler rollator.  No falls other than due to her syncopal episodes. Pt states CPR can be performed if needed. Nothing long term. Can call ambulance if pt passes out or has a seizure. Neighbor was on her L side when talking to her prior to passing out.       Patient Stated Goals  Be able to walk better.    Currently in Pain?  Yes    Pain Score  5    B shoulder pain   Pain Onset  More than a month ago                               PT Education - 08/19/18 1315    Education Details  ther-ex    [employer]) Educated  Patient    Methods  Explanation;Demonstration;Tactile cues;Verbal cues    Comprehension  Returned demonstration;Verbalized understanding      Objectives  No latex band allergies   MedbridgeAccess Code: [email or web address]   Gait: antalgic, decreased stance L LE, L femoral IR during L LE stance phase.  No pacemakers or defibrillators   Ther-ex  Seated gentle manually resisted trunk flexion isometrics at neutral 10x5 seconds for 3 sets to promote trunk control during gait  Standing B shoulder extension resisting yellow band with scapular retraction              10x5 seconds to promote trunk and scapular strength (for shoulders) for 3 sets   Seated clamshells yellow band, hips less than 90 degrees flexion 10x3  Seated LAQ 2 lbs 5x3 each LE   Decreased L anterior thigh discomfort with addition of trunk flexion as well as increased use of L glute max muscles  Seated L hip extension isometrics 10x5 seconds for 2 sets. Decreased L anterior thigh discomfort with seated L knee extension  Standing hip abduction with 2 lb ankle weight with B UE assist 10x each LE   Standing straight pallof press resisting double yellow band 10x5 seconds   Standing heel raises (secondary to Pt stating R gastroc weakness) with B UE assist  10x2    Improved exercise technique, movement at target joints, use of target muscles after min to mod  verbal, visual, tactile cues.     Continued working on trunk and LE strengthening to help improve ability to perform standing tasks with less difficulty. Also worked on scapular retraction exercises to help decrease shoulder pain.        PT Short Term Goals - 07/07/18 1403      PT SHORT TERM GOAL #1   Title  Patient will be independent with her HEP to promote balance and decrease fall risk.     Baseline  Pt states performing her exercises at home (07/07/2018)    Time  3    Period  Weeks    Status  Achieved    Target Date  06/26/18        PT Long Term Goals - 07/07/18 1426      PT LONG TERM GOAL #1   Title  Patient will improve B LE strength by at least 1/2 MMT grade to promote ability to perform standing tasks with decreased fall risk.     Time  9    Period  Weeks    Status  Partially Met    Target Date  09/11/18      PT LONG TERM GOAL #2   Title  Patient will improve TUG time to 12 seconds or less with use of SPC to promote functional mobility and decrease fall risk.     Baseline  18.4 seconds on average with SPC on L (06/03/2018); 16.06 seconds average with SPC (07/07/2018)    Time  9    Period  Weeks    Status  Partially Met    Target Date  09/11/18      PT LONG TERM GOAL #3   Title  Patient will improve FOTO score by at least 10 points as a demonstration of improved function.     Baseline  Lower Leg FOTO 40 (06/03/2018); 41 (07/07/2018)    Time  9    Period  Weeks    Status  On-going    Target Date  09/11/18      PT LONG TERM GOAL #4   Title  Patient will be able to ambulate 100 ft or more with SBA to independent to promote mobility.     Baseline  Pt currently ambulates with SPC (06/03/2018); pt able to ambulate 200 ft without using SPC CGA. LOB x 1 with independent recovery (07/07/2018)    Time  9    Period  Weeks    Status  Partially Met    Target Date  09/11/18            Plan - 08/19/18 1308    Clinical Impression Statement  Continued working  on trunk and LE strengthening to help improve ability to perform standing tasks with less difficulty. Also worked on scapular retraction exercises to help decrease shoulder pain.    Rehab Potential  Fair    Clinical Impairments Affecting Rehab Potential  (-) age, hx of falls, weakness, medical history; (+) motivated    PT Frequency  2x / week    PT Duration  Other (comment)   9 weeks   PT Treatment/Interventions  Therapeutic activities;Therapeutic exercise;Neuromuscular re-education;Balance training;Patient/family education;Manual techniques;Drohan needling;Aquatic Therapy;Gait training    PT Next Visit Plan  trunk, B hip and LE strengthening, balance, gait, manual techniques and modalities PRN.     Consulted and Agree with Plan of Care  Patient       Patient will benefit from skilled therapeutic intervention in order to improve the following deficits and impairments:  Postural dysfunction, Improper body mechanics, Difficulty walking, Decreased strength, Decreased balance, Abnormal gait  Visit Diagnosis: Muscle weakness (generalized)  History of falling  Difficulty in walking, not elsewhere classified     Problem List Patient Active Problem List   Diagnosis Date Noted  . S/P ORIF (open reduction internal fixation) fracture radius and ulnar fx 04/08/18 04/10/2018  . Distal radius and ulna fracture, right   . History of CVA (cerebrovascular accident)   . Benign essential  HTN   . Tachycardia   . Leukocytosis   . Seizures ([place])   . Stroke-like episode ([place]) s/p IV tPA 04/03/2018  . Syncope   . TIA (transient ischemic attack) 11/04/2017  . Malnutrition of moderate degree 08/26/2017  . Total knee replacement status 08/07/2017  . Closed left hip fracture ([place]) 02/12/2017  . HTN (hypertension) 02/12/2017  . HLD (hyperlipidemia) 02/12/2017    Joneen Boers PT, DPT   08/19/2018, 7:35 PM  [facility] [address] [place], [address] Phone: [phone]   Fax:  [phone]  Name: VALORY WETHERBY MRN: [id number] Date of Birth: 01/18/1931

## 2018-08-21 ENCOUNTER — Ambulatory Visit: Payer: PPO

## 2018-08-21 DIAGNOSIS — M6281 Muscle weakness (generalized): Secondary | ICD-10-CM

## 2018-08-21 DIAGNOSIS — R262 Difficulty in walking, not elsewhere classified: Secondary | ICD-10-CM

## 2018-08-21 DIAGNOSIS — Z9181 History of falling: Secondary | ICD-10-CM

## 2018-08-21 NOTE — Therapy (Signed)
[facility] [address], [place], [address] Phone: [phone]   Fax:  [phone]  Physical Therapy Treatment  Patient Details  Name: Kellie Harris MRN: [id number] Date of Birth: 1930-08-17 Referring Provider (PT): Antony Contras, MD   Encounter Date: 08/21/2018  PT End of Session - 08/21/18 1418    Visit Number  13    Number of Visits  35    Date for PT Re-Evaluation  09/11/18    Authorization Type  4    Authorization Time Period  of 10 progress report    PT Start Time  1418    PT Stop Time  1502    PT Time Calculation (min)  44 min    Equipment Utilized During Treatment  Gait belt    Activity Tolerance  Patient tolerated treatment well    Behavior During Therapy  WFL for tasks assessed/performed       Past Medical History:  Diagnosis Date  . Arthritis    right knee, left shoulder/ OSTEO  . Cancer ([place])    skin   . Degenerative disc disease, lumbar    L4-L5  . Diverticulosis    ITIS  . History of hiatal hernia   . Hydronephrosis    right  . Hypercholesteremia   . Hypertension    CONTROLLED ON MEDS  . IBS (irritable bowel syndrome)   . Knee fracture, right 03/05/2015   DIFFICULTY WITH AMBULATION  . Osteopenia   . Ovarian cyst   . Pancreatic insufficiency   . Wears hearing aid    bilateral    Past Surgical History:  Procedure Laterality Date  . CATARACT EXTRACTION W/PHACO Right 11/02/2015   Procedure: CATARACT EXTRACTION PHACO AND INTRAOCULAR LENS PLACEMENT (IOC);  Surgeon: Leandrew Koyanagi, MD;  Location: [facility];  Service: Ophthalmology;  Laterality: Right;  . CATARACT EXTRACTION W/PHACO Left 07/18/2016   Procedure: CATARACT EXTRACTION PHACO AND INTRAOCULAR LENS PLACEMENT (IOC);  Surgeon: Leandrew Koyanagi, MD;  Location: [facility];  Service: Ophthalmology;  Laterality: Left;  . COLONOSCOPY    . FRACTURE SURGERY Left    hip  . history of left hip fracture    .  INTRAMEDULLARY (IM) NAIL INTERTROCHANTERIC Left 02/12/2017   Procedure: INTRAMEDULLARY (IM) NAIL INTERTROCHANTRIC;  Surgeon: Earnestine Leys, MD;  Location: [facility];  Service: Orthopedics;  Laterality: Left;  . kidney stent    . OOPHORECTOMY Bilateral 2015  . ORIF WRIST FRACTURE Right 04/08/2018   Procedure: OPEN REDUCTION INTERNAL FIXATION (ORIF) WRIST FRACTURE;  Surgeon: Altamese Speedway, MD;  Location: [facility];  Service: Orthopedics;  Laterality: Right;  . TONSILLECTOMY    . TOTAL KNEE ARTHROPLASTY Right 08/07/2017   Procedure: TOTAL KNEE ARTHROPLASTY;  Surgeon: Earnestine Leys, MD;  Location: [facility];  Service: Orthopedics;  Laterality: Right;    There were no vitals filed for this visit.  Subjective Assessment - 08/21/18 1420    Subjective  Pt states that her eyes have been bothering her occasionally. Neurologist did not think it was anything neurological.  Does not have a clear vison at times, other times her vision is clear.    Shoulders bother her.     Pertinent History  Pt states being referred to PT for balance and gait. Gait and balance has been going dowhnill since December 2018 after her R TKA.  [facility] after being discharged from skilled nursing facility and fractured the head of her L femur. Did not have surgery due to  the fracture not being bad. Had a sycope episode March 2019. She was out all of a sudden. Had a total stroke work up and was within normal limits.  Was coming along alright. However, had another syncopal episode 04/03/2018. Pt was standing and talking to her neighbor. Does not remember the ambulance coming. She seemed like she had a stroke. Had a second stroke work up and the results were within normal limits. Took her longer to recover from her syncopal episode.  Currently taking plavix.  Currently taking the medication Kepra for seizzures. Pt states that when the episodes occur, there is no warning.   Had R wrist fracture ORIF on 04/08/2018.  Pt currently NWB R wrist.   Mainly uses  her hurricain SPC.  Furniture walk at home. Also has a 3 wheeler and 4 wheeler rollator.  No falls other than due to her syncopal episodes. Pt states CPR can be performed if needed. Nothing long term. Can call ambulance if pt passes out or has a seizure. Neighbor was on her L side when talking to her prior to passing out.       Patient Stated Goals  Be able to walk better.    Currently in Pain?  Yes    Pain Score  3     Pain Location  Shoulder    Pain Orientation  Right;Left    Pain Onset  More than a month ago                               PT Education - 08/21/18 1428    Education Details  ther-ex    Person(s) Educated  Patient    Methods  Explanation;Demonstration;Tactile cues;Verbal cues    Comprehension  Returned demonstration;Verbalized understanding       Objectives  No latex band allergies   MedbridgeAccess Code: [email or web address]   Gait: antalgic, decreased stance L LE, L femoral IR during L LE stance phase.  No pacemakers or defibrillators   Ther-ex  Blood pressure L arm sitting, mechanically taken, normal cuff: 160/83, HR 68. No headaches  Seated gentle manually resisted trunk flexion isometrics at neutral 10x5 seconds for 3 setsto promote trunk control during gait  Seated LAQ 2 lbs 5x3 each LE to promote quad strengthening   Seated L hip extension isometrics 10x5 seconds for 2 sets.   Standing hip abduction with 2 lb ankle weight with B UE assist 10x2 each LE to promote glute med muscle strengthening to decrease difficulty with gait.   Forward step up onto 4 inch step with one UE assist 10x2 each LE  Standing heel raises with B UE assist             10x2  Side stepping 20 ft to the R and 20 ft to the L 2x with rest breaks secondary to fatigue CGA without SPC use to promote balance and glute med muscle strengthening.    Improved exercise technique, movement at target joints, use of target muscles after min to mod verbal,  visual, tactile cues.   Continued working on trunk and glute strengthening to promote ability to ambulate with less lateral trunk lean as well as to help decrease pressure to low back and promote femoral control with standing tasks. Pt will benefit from continued skilled physical therapy services to improve strength, activity tolerance, and ability to perform functional tasks.  PT Short Term Goals - 07/07/18 1403      PT SHORT TERM GOAL #1   Title  Patient will be independent with her HEP to promote balance and decrease fall risk.     Baseline  Pt states performing her exercises at home (07/07/2018)    Time  3    Period  Weeks    Status  Achieved    Target Date  06/26/18        PT Long Term Goals - 07/07/18 1426      PT LONG TERM GOAL #1   Title  Patient will improve B LE strength by at least 1/2 MMT grade to promote ability to perform standing tasks with decreased fall risk.     Time  9    Period  Weeks    Status  Partially Met    Target Date  09/11/18      PT LONG TERM GOAL #2   Title  Patient will improve TUG time to 12 seconds or less with use of SPC to promote functional mobility and decrease fall risk.     Baseline  18.4 seconds on average with SPC on L (06/03/2018); 16.06 seconds average with SPC (07/07/2018)    Time  9    Period  Weeks    Status  Partially Met    Target Date  09/11/18      PT LONG TERM GOAL #3   Title  Patient will improve FOTO score by at least 10 points as a demonstration of improved function.     Baseline  Lower Leg FOTO 40 (06/03/2018); 41 (07/07/2018)    Time  9    Period  Weeks    Status  On-going    Target Date  09/11/18      PT LONG TERM GOAL #4   Title  Patient will be able to ambulate 100 ft or more with SBA to independent to promote mobility.     Baseline  Pt currently ambulates with SPC (06/03/2018); pt able to ambulate 200 ft without using SPC CGA. LOB x 1 with independent recovery (07/07/2018)     Time  9    Period  Weeks    Status  Partially Met    Target Date  09/11/18            Plan - 08/21/18 1435    Clinical Impression Statement  Continued working on trunk and glute strengthening to promote ability to ambulate with less lateral trunk lean as well as to help decrease pressure to low back and promote femoral control with standing tasks. Pt will benefit from continued skilled physical therapy services to improve strength, activity tolerance, and ability to perform functional tasks.     Rehab Potential  Fair    Clinical Impairments Affecting Rehab Potential  (-) age, hx of falls, weakness, medical history; (+) motivated    PT Frequency  2x / week    PT Duration  Other (comment)   9 weeks   PT Treatment/Interventions  Therapeutic activities;Therapeutic exercise;Neuromuscular re-education;Balance training;Patient/family education;Manual techniques;Reindel needling;Aquatic Therapy;Gait training    PT Next Visit Plan  trunk, B hip and LE strengthening, balance, gait, manual techniques and modalities PRN.     Consulted and Agree with Plan of Care  Patient       Patient will benefit from skilled therapeutic intervention in order to improve the following deficits and impairments:  Postural dysfunction, Improper body mechanics, Difficulty walking, Decreased strength, Decreased balance, Abnormal gait  Visit Diagnosis: Muscle weakness (generalized)  History of falling  Difficulty in walking, not elsewhere classified     Problem List Patient Active Problem List   Diagnosis Date Noted  . S/P ORIF (open reduction internal fixation) fracture radius and ulnar fx 04/08/18 04/10/2018  . Distal radius and ulna fracture, right   . History of CVA (cerebrovascular accident)   . Benign essential HTN   . Tachycardia   . Leukocytosis   . Seizures ([place])   . Stroke-like episode ([place]) s/p IV tPA 04/03/2018  . Syncope   . TIA (transient ischemic attack) 11/04/2017  . Malnutrition of  moderate degree 08/26/2017  . Total knee replacement status 08/07/2017  . Closed left hip fracture ([place]) 02/12/2017  . HTN (hypertension) 02/12/2017  . HLD (hyperlipidemia) 02/12/2017   Joneen Boers PT, DPT   08/21/2018, 3:17 PM  [facility] [address] [place], [address] Phone: [phone]   Fax:  [phone]  Name: Kellie Harris MRN: [id number] Date of Birth: September 23, 1930

## 2018-08-25 ENCOUNTER — Ambulatory Visit: Payer: PPO | Admitting: Neurology

## 2018-08-25 ENCOUNTER — Ambulatory Visit: Payer: PPO

## 2018-08-28 ENCOUNTER — Ambulatory Visit: Payer: PPO

## 2018-08-28 DIAGNOSIS — R262 Difficulty in walking, not elsewhere classified: Secondary | ICD-10-CM

## 2018-08-28 DIAGNOSIS — Z9181 History of falling: Secondary | ICD-10-CM

## 2018-08-28 DIAGNOSIS — M6281 Muscle weakness (generalized): Secondary | ICD-10-CM | POA: Diagnosis not present

## 2018-08-28 NOTE — Therapy (Signed)
[facility] [address] [place], [address] Phone: [phone]   Fax:  [phone]  Physical Therapy Treatment  Patient Details  Name: Kellie Harris MRN: [id number] Date of Birth: February 23, 1931 Referring Provider (PT): Antony Contras, MD   Encounter Date: 08/28/2018  PT End of Session - 08/28/18 1434    Visit Number  14    Number of Visits  35    Date for PT Re-Evaluation  09/11/18    Authorization Type  5    Authorization Time Period  of 10 progress report    PT Start Time  1434    PT Stop Time  1519    PT Time Calculation (min)  45 min    Equipment Utilized During Treatment  Gait belt    Activity Tolerance  Patient tolerated treatment well    Behavior During Therapy  WFL for tasks assessed/performed       Past Medical History:  Diagnosis Date  . Arthritis    right knee, left shoulder/ OSTEO  . Cancer ([place])    skin   . Degenerative disc disease, lumbar    L4-L5  . Diverticulosis    ITIS  . History of hiatal hernia   . Hydronephrosis    right  . Hypercholesteremia   . Hypertension    CONTROLLED ON MEDS  . IBS (irritable bowel syndrome)   . Knee fracture, right 03/05/2015   DIFFICULTY WITH AMBULATION  . Osteopenia   . Ovarian cyst   . Pancreatic insufficiency   . Wears hearing aid    bilateral    Past Surgical History:  Procedure Laterality Date  . CATARACT EXTRACTION W/PHACO Right 11/02/2015   Procedure: CATARACT EXTRACTION PHACO AND INTRAOCULAR LENS PLACEMENT (IOC);  Surgeon: Leandrew Koyanagi, MD;  Location: [facility];  Service: Ophthalmology;  Laterality: Right;  . CATARACT EXTRACTION W/PHACO Left 07/18/2016   Procedure: CATARACT EXTRACTION PHACO AND INTRAOCULAR LENS PLACEMENT (IOC);  Surgeon: Leandrew Koyanagi, MD;  Location: [facility];  Service: Ophthalmology;  Laterality: Left;  . COLONOSCOPY    . FRACTURE SURGERY Left    hip  . history of left hip fracture     . INTRAMEDULLARY (IM) NAIL INTERTROCHANTERIC Left 02/12/2017   Procedure: INTRAMEDULLARY (IM) NAIL INTERTROCHANTRIC;  Surgeon: Earnestine Leys, MD;  Location: [facility];  Service: Orthopedics;  Laterality: Left;  . kidney stent    . OOPHORECTOMY Bilateral 2015  . ORIF WRIST FRACTURE Right 04/08/2018   Procedure: OPEN REDUCTION INTERNAL FIXATION (ORIF) WRIST FRACTURE;  Surgeon: Altamese McClenney Tract, MD;  Location: [facility];  Service: Orthopedics;  Laterality: Right;  . TONSILLECTOMY    . TOTAL KNEE ARTHROPLASTY Right 08/07/2017   Procedure: TOTAL KNEE ARTHROPLASTY;  Surgeon: Earnestine Leys, MD;  Location: [facility];  Service: Orthopedics;  Laterality: Right;    There were no vitals filed for this visit.  Subjective Assessment - 08/28/18 1434    Subjective  Does not feel too good this week. Had a dizzy spell Sunday night. No syncope. Felt like if she kept standing up, she might pass out.  As soon as her dizziness occured, she went to her bed and lied down. Her vitals were 131/76, and HR 71 lying down.   Felt better lying down for about 15 minutes to an hour. Pt states that she is always standing up when she passed out.  Felt extremely weak since then. Still feels week today.  Woke up lying down this morning and  felt better.  Stood up and did not feel as good.  Pt states that her dizzy spells happen every 6 months and its getting closer to 6 months.  Had 2 complete check ups and did not find anything. No pain, no difficulty breathing, no chest pain, no nausea.  Pt was making food in the kitchen when the dizziness occured Sunday.     Pertinent History  Pt states being referred to PT for balance and gait. Gait and balance has been going dowhnill since December 2018 after her R TKA.  [facility] after being discharged from skilled nursing facility and fractured the head of her L femur. Did not have surgery due to the fracture not being bad. Had a sycope episode March 2019. She was out all of a sudden. Had a total stroke work  up and was within normal limits.  Was coming along alright. However, had another syncopal episode 04/03/2018. Pt was standing and talking to her neighbor. Does not remember the ambulance coming. She seemed like she had a stroke. Had a second stroke work up and the results were within normal limits. Took her longer to recover from her syncopal episode.  Currently taking plavix.  Currently taking the medication Kepra for seizzures. Pt states that when the episodes occur, there is no warning.   Had R wrist fracture ORIF on 04/08/2018.  Pt currently NWB R wrist.   Mainly uses her hurricain SPC.  Furniture walk at home. Also has a 3 wheeler and 4 wheeler rollator.  No falls other than due to her syncopal episodes. Pt states CPR can be performed if needed. Nothing long term. Can call ambulance if pt passes out or has a seizure. Neighbor was on her L side when talking to her prior to passing out.       Patient Stated Goals  Be able to walk better.    Currently in Pain?  Other (Comment)   no pain level provided   Pain Onset  More than a month ago                               PT Education - 08/28/18 1444    Education Details  ther-ex    Person(s) Educated  Patient    Methods  Explanation;Demonstration;Tactile cues;Verbal cues    Comprehension  Returned demonstration;Verbalized understanding      Objectives  No latex band allergies   Pt states having dizzy spells about 3 years ago  MedbridgeAccess Code: [email or web address]   Gait: antalgic, decreased stance L LE, L femoral IR during L LE stance phase.  No pacemakers or defibrillators   Ther-ex  Blood pressure mechanically taken, normal cuff:   L arm sitting 149/85, HR 74, no dizziness currently  L arm standing after 2 minutes: 166/113, HR 84  Readjusted the cuff after standing after sitting and: 165/78, HR 76  Seated L hip extension isometrics 10x5 seconds for 2 sets.   L knee felt better after exercise  Seated  LAQ 5x4 with 5 second holds each LE to promote quad strengthening   Seated clamshell isometrics with manual resistance from PT 10x5 seconds for 3 sets hips less than 90 degrees flexion  Cervical rotation to end range: no dizziness.   Seated B scapular retraction 10x5 seconds for 2 sets to promote thoracic extension and decrease low back pressure. Decreased R shoulder pain when raising her R arm up as well.   Improved exercise  technique, movement at target joints, use of target muscles after min to mod verbal, visual, tactile cues.    Light exercises performed in sitting to promote gentle strengthening and function performed today secondary to elevated blood pressure levels. Pt demonstrates increased blood pressure levels in standing compared to sitting. Decreased R shoulder pain with shoulder flexion after scapular retraction exercise. Pt tolerated session well without aggravation of pain or complain of dizziness. Pt will benefit from continued skilled physical therapy services to improve strength and function.          PT Short Term Goals - 07/07/18 1403      PT SHORT TERM GOAL #1   Title  Patient will be independent with her HEP to promote balance and decrease fall risk.     Baseline  Pt states performing her exercises at home (07/07/2018)    Time  3    Period  Weeks    Status  Achieved    Target Date  06/26/18        PT Long Term Goals - 07/07/18 1426      PT LONG TERM GOAL #1   Title  Patient will improve B LE strength by at least 1/2 MMT grade to promote ability to perform standing tasks with decreased fall risk.     Time  9    Period  Weeks    Status  Partially Met    Target Date  09/11/18      PT LONG TERM GOAL #2   Title  Patient will improve TUG time to 12 seconds or less with use of SPC to promote functional mobility and decrease fall risk.     Baseline  18.4 seconds on average with SPC on L (06/03/2018); 16.06 seconds average with SPC (07/07/2018)    Time  9     Period  Weeks    Status  Partially Met    Target Date  09/11/18      PT LONG TERM GOAL #3   Title  Patient will improve FOTO score by at least 10 points as a demonstration of improved function.     Baseline  Lower Leg FOTO 40 (06/03/2018); 41 (07/07/2018)    Time  9    Period  Weeks    Status  On-going    Target Date  09/11/18      PT LONG TERM GOAL #4   Title  Patient will be able to ambulate 100 ft or more with SBA to independent to promote mobility.     Baseline  Pt currently ambulates with SPC (06/03/2018); pt able to ambulate 200 ft without using SPC CGA. LOB x 1 with independent recovery (07/07/2018)    Time  9    Period  Weeks    Status  Partially Met    Target Date  09/11/18            Plan - 08/28/18 1434    Clinical Impression Statement  Light exercises performed in sitting to promote gentle strengthening and function performed today secondary to elevated blood pressure levels. Pt demonstrates increased blood pressure levels in standing compared to sitting. Decreased R shoulder pain with shoulder flexion after scapular retraction exercise. Pt tolerated session well without aggravation of pain or complain of dizziness. Pt will benefit from continued skilled physical therapy services to improve strength and function.      Rehab Potential  Fair    Clinical Impairments Affecting Rehab Potential  (-) age, hx of falls, weakness, medical  history; (+) motivated    PT Frequency  2x / week    PT Duration  Other (comment)   9 weeks   PT Treatment/Interventions  Therapeutic activities;Therapeutic exercise;Neuromuscular re-education;Balance training;Patient/family education;Manual techniques;[place] needling;Aquatic Therapy;Gait training    PT Next Visit Plan  trunk, B hip and LE strengthening, balance, gait, manual techniques and modalities PRN.     Consulted and Agree with Plan of Care  Patient       Patient will benefit from skilled therapeutic intervention in order to improve  the following deficits and impairments:  Postural dysfunction, Improper body mechanics, Difficulty walking, Decreased strength, Decreased balance, Abnormal gait  Visit Diagnosis: Muscle weakness (generalized)  History of falling  Difficulty in walking, not elsewhere classified     Problem List Patient Active Problem List   Diagnosis Date Noted  . S/P ORIF (open reduction internal fixation) fracture radius and ulnar fx 04/08/18 04/10/2018  . Distal radius and ulna fracture, right   . History of CVA (cerebrovascular accident)   . Benign essential HTN   . Tachycardia   . Leukocytosis   . Seizures ([place])   . Stroke-like episode ([place]) s/p IV tPA 04/03/2018  . Syncope   . TIA (transient ischemic attack) 11/04/2017  . Malnutrition of moderate degree 08/26/2017  . Total knee replacement status 08/07/2017  . Closed left hip fracture ([place]) 02/12/2017  . HTN (hypertension) 02/12/2017  . HLD (hyperlipidemia) 02/12/2017    Joneen Boers PT, DPT   08/28/2018, 3:31 PM  [facility] [address] [place], [address] Phone: [phone]   Fax:  [phone]  Name: ELISABEL HANOVER MRN: [id number] Date of Birth: 02/12/1931

## 2018-09-01 ENCOUNTER — Ambulatory Visit: Payer: PPO

## 2018-09-01 DIAGNOSIS — Z9181 History of falling: Secondary | ICD-10-CM

## 2018-09-01 DIAGNOSIS — M6281 Muscle weakness (generalized): Secondary | ICD-10-CM

## 2018-09-01 DIAGNOSIS — R262 Difficulty in walking, not elsewhere classified: Secondary | ICD-10-CM

## 2018-09-01 NOTE — Therapy (Signed)
[facility] [address], [place], [address] Phone: [phone]   Fax:  [phone]  Physical Therapy Treatment  Patient Details  Name: Kellie Harris MRN: [id number] Date of Birth: 10/29/1930 Referring Provider (PT): Antony Contras, MD   Encounter Date: 09/01/2018  PT End of Session - 09/01/18 1347    Visit Number  15    Number of Visits  35    Date for PT Re-Evaluation  09/11/18    Authorization Type  6    Authorization Time Period  of 10 progress report    PT Start Time  1347    PT Stop Time  1432    PT Time Calculation (min)  45 min    Equipment Utilized During Treatment  Gait belt    Activity Tolerance  Patient tolerated treatment well    Behavior During Therapy  [facility] for tasks assessed/performed       Past Medical History:  Diagnosis Date  . Arthritis    right knee, left shoulder/ OSTEO  . Cancer ([place])    skin   . Degenerative disc disease, lumbar    L4-L5  . Diverticulosis    ITIS  . History of hiatal hernia   . Hydronephrosis    right  . Hypercholesteremia   . Hypertension    CONTROLLED ON MEDS  . IBS (irritable bowel syndrome)   . Knee fracture, right 03/05/2015   DIFFICULTY WITH AMBULATION  . Osteopenia   . Ovarian cyst   . Pancreatic insufficiency   . Wears hearing aid    bilateral    Past Surgical History:  Procedure Laterality Date  . CATARACT EXTRACTION W/PHACO Right 11/02/2015   Procedure: CATARACT EXTRACTION PHACO AND INTRAOCULAR LENS PLACEMENT (IOC);  Surgeon: Leandrew Koyanagi, MD;  Location: [place];  Service: Ophthalmology;  Laterality: Right;  . CATARACT EXTRACTION W/PHACO Left 07/18/2016   Procedure: CATARACT EXTRACTION PHACO AND INTRAOCULAR LENS PLACEMENT (IOC);  Surgeon: Leandrew Koyanagi, MD;  Location: [facility];  Service: Ophthalmology;  Laterality: Left;  . COLONOSCOPY    . FRACTURE SURGERY Left    hip  . history of left hip fracture     . INTRAMEDULLARY (IM) NAIL INTERTROCHANTERIC Left 02/12/2017   Procedure: INTRAMEDULLARY (IM) NAIL INTERTROCHANTRIC;  Surgeon: Earnestine Leys, MD;  Location: [facility];  Service: Orthopedics;  Laterality: Left;  . kidney stent    . OOPHORECTOMY Bilateral 2015  . ORIF WRIST FRACTURE Right 04/08/2018   Procedure: OPEN REDUCTION INTERNAL FIXATION (ORIF) WRIST FRACTURE;  Surgeon: Altamese Granton, MD;  Location: [facility];  Service: Orthopedics;  Laterality: Right;  . TONSILLECTOMY    . TOTAL KNEE ARTHROPLASTY Right 08/07/2017   Procedure: TOTAL KNEE ARTHROPLASTY;  Surgeon: Earnestine Leys, MD;  Location: [facility];  Service: Orthopedics;  Laterality: Right;    There were no vitals filed for this visit.  Subjective Assessment - 09/01/18 1349    Subjective  Pt states feeling a little better. Eyes also feel a little fuzzy. Has an optomologist appointment next week.     Pertinent History  Pt states being referred to PT for balance and gait. Gait and balance has been going dowhnill since December 2018 after her R TKA.  [facility] after being discharged from skilled nursing facility and fractured the head of her L femur. Did not have surgery due to the fracture not being bad. Had a sycope episode March 2019. She was out all of a sudden. Had a total  stroke work up and was within normal limits.  Was coming along alright. However, had another syncopal episode 04/03/2018. Pt was standing and talking to her neighbor. Does not remember the ambulance coming. She seemed like she had a stroke. Had a second stroke work up and the results were within normal limits. Took her longer to recover from her syncopal episode.  Currently taking plavix.  Currently taking the medication Kepra for seizzures. Pt states that when the episodes occur, there is no warning.   Had R wrist fracture ORIF on 04/08/2018.  Pt currently NWB R wrist.   Mainly uses her hurricain SPC.  Furniture walk at home. Also has a 3 wheeler and 4 wheeler rollator.  No falls  other than due to her syncopal episodes. Pt states CPR can be performed if needed. Nothing long term. Can call ambulance if pt passes out or has a seizure. Neighbor was on her L side when talking to her prior to passing out.       Patient Stated Goals  Be able to walk better.    Currently in Pain?  Yes    Pain Score  5    4-5/10 B shoulder pain   Pain Onset  More than a month ago                               PT Education - 09/01/18 1401    Education Details  ther-ex    [employer]) Educated  Patient    Methods  Explanation;Demonstration;Tactile cues;Verbal cues    Comprehension  Returned demonstration;Verbalized understanding       Objectives  No latex band allergies   Pt states having dizzy spells about 3 years ago  MedbridgeAccess Code: [email or web address]   Gait: antalgic, decreased stance L LE, L femoral IR during L LE stance phase.  No pacemakers or defibrillators  Going to go with Dr. Gurney Maxin, MD for neurology.     Ther-ex  Blood pressure mechanically taken, normal cuff:              after 2 min of standing L arm standing: 155/104, HR 87   Then immediatley after standing up from sitting: 158/86, HR 78     Increased diastolic value with prolonged standing observed.   Seated L hip extension isometrics 10x5 seconds for 2 sets.              Pt states the exercise helps with her L knee pain  Seated clamshell isometrics with manual resistance from PT 10x5 seconds for 3 sets hips less than 90 degrees flexion  Seated LAQ 10x with 5 second holds each LEto promote quad strengthening  Static standing 2 min   Blood pressure L arm standing, normal cuff, mechanically taken after aforementioned sitting exercises:183/89, HR 79  Seated B scapular retraction 10x5 seconds for 3 sets to promote thoracic extension and decrease low back pressure.  Seated B shoulder ER no resistance, pain free range 10x2  Decreased pain with shoulder flexion  afterwards.    Improved exercise technique, movement at target joints, use of target muscles after min to mod verbal, visual, tactile cues.    No complain of dizziness throughout session. Decreased fuzzy vision towards end of session per pt reports. Decreased R shoulder pain with shoulder flexion following activation of infraspinatus muscles.    Increased diastolic or systolic blood pressure levels observed with standing at least 2 minutes. Continued working on gentle  LE strengthening in sitting to maintain/promote ability to perform functional tasks. Decreased R shoulder pain with flexion following exercise to gently activate her infraspinatus muscle. Pt will benefit from continued skilled physical therapy services to improve strength and function.     PT Short Term Goals - 07/07/18 1403      PT SHORT TERM GOAL #1   Title  Patient will be independent with her HEP to promote balance and decrease fall risk.     Baseline  Pt states performing her exercises at home (07/07/2018)    Time  3    Period  Weeks    Status  Achieved    Target Date  06/26/18        PT Long Term Goals - 07/07/18 1426      PT LONG TERM GOAL #1   Title  Patient will improve B LE strength by at least 1/2 MMT grade to promote ability to perform standing tasks with decreased fall risk.     Time  9    Period  Weeks    Status  Partially Met    Target Date  09/11/18      PT LONG TERM GOAL #2   Title  Patient will improve TUG time to 12 seconds or less with use of SPC to promote functional mobility and decrease fall risk.     Baseline  18.4 seconds on average with SPC on L (06/03/2018); 16.06 seconds average with SPC (07/07/2018)    Time  9    Period  Weeks    Status  Partially Met    Target Date  09/11/18      PT LONG TERM GOAL #3   Title  Patient will improve FOTO score by at least 10 points as a demonstration of improved function.     Baseline  Lower Leg FOTO 40 (06/03/2018); 41 (07/07/2018)    Time  9     Period  Weeks    Status  On-going    Target Date  09/11/18      PT LONG TERM GOAL #4   Title  Patient will be able to ambulate 100 ft or more with SBA to independent to promote mobility.     Baseline  Pt currently ambulates with SPC (06/03/2018); pt able to ambulate 200 ft without using SPC CGA. LOB x 1 with independent recovery (07/07/2018)    Time  9    Period  Weeks    Status  Partially Met    Target Date  09/11/18            Plan - 09/01/18 1354    Clinical Impression Statement  Increased diastolic or systolic blood pressure levels observed with standing at least 2 minutes. Continued working on gentle LE strengthening in sitting to maintain/promote ability to perform functional tasks. Decreased R shoulder pain with flexion following exercise to gently activate her infraspinatus muscle. Pt will benefit from continued skilled physical therapy services to improve strength and function.     Rehab Potential  Fair    Clinical Impairments Affecting Rehab Potential  (-) age, hx of falls, weakness, medical history; (+) motivated    PT Frequency  2x / week    PT Duration  Other (comment)   9 weeks   PT Treatment/Interventions  Therapeutic activities;Therapeutic exercise;Neuromuscular re-education;Balance training;Patient/family education;Manual techniques;[place] needling;Aquatic Therapy;Gait training    PT Next Visit Plan  trunk, B hip and LE strengthening, balance, gait, manual techniques and modalities PRN.  Consulted and Agree with Plan of Care  Patient       Patient will benefit from skilled therapeutic intervention in order to improve the following deficits and impairments:  Postural dysfunction, Improper body mechanics, Difficulty walking, Decreased strength, Decreased balance, Abnormal gait  Visit Diagnosis: Muscle weakness (generalized)  History of falling  Difficulty in walking, not elsewhere classified     Problem List Patient Active Problem List   Diagnosis Date  Noted  . S/P ORIF (open reduction internal fixation) fracture radius and ulnar fx 04/08/18 04/10/2018  . Distal radius and ulna fracture, right   . History of CVA (cerebrovascular accident)   . Benign essential HTN   . Tachycardia   . Leukocytosis   . Seizures ([place])   . Stroke-like episode ([place]) s/p IV tPA 04/03/2018  . Syncope   . TIA (transient ischemic attack) 11/04/2017  . Malnutrition of moderate degree 08/26/2017  . Total knee replacement status 08/07/2017  . Closed left hip fracture ([place]) 02/12/2017  . HTN (hypertension) 02/12/2017  . HLD (hyperlipidemia) 02/12/2017   Joneen Boers PT, DPT    09/01/2018, 7:23 PM  [facility] [address], [place], [address] Phone: [phone]   Fax:  [phone]  Name: Kellie Harris MRN: [id number] Date of Birth: Dec 12, 1930

## 2018-09-04 ENCOUNTER — Ambulatory Visit: Payer: PPO

## 2018-09-04 DIAGNOSIS — M6281 Muscle weakness (generalized): Secondary | ICD-10-CM

## 2018-09-04 DIAGNOSIS — R262 Difficulty in walking, not elsewhere classified: Secondary | ICD-10-CM

## 2018-09-04 DIAGNOSIS — Z9181 History of falling: Secondary | ICD-10-CM

## 2018-09-04 NOTE — Therapy (Signed)
[facility] [address], [place], [address] Phone: [phone]   Fax:  [phone]  Physical Therapy Treatment  Patient Details  Name: Kellie Harris MRN: [id number] Date of Birth: 1931-03-15 Referring Provider (PT): Antony Contras, MD   Encounter Date: 09/04/2018  PT End of Session - 09/04/18 1305    Visit Number  16    Number of Visits  35    Date for PT Re-Evaluation  09/11/18    Authorization Type  7    Authorization Time Period  of 10 progress report    PT Start Time  1306    PT Stop Time  1352    PT Time Calculation (min)  46 min    Equipment Utilized During Treatment  Gait belt    Activity Tolerance  Patient tolerated treatment well    Behavior During Therapy  WFL for tasks assessed/performed       Past Medical History:  Diagnosis Date  . Arthritis    right knee, left shoulder/ OSTEO  . Cancer ([place])    skin   . Degenerative disc disease, lumbar    L4-L5  . Diverticulosis    ITIS  . History of hiatal hernia   . Hydronephrosis    right  . Hypercholesteremia   . Hypertension    CONTROLLED ON MEDS  . IBS (irritable bowel syndrome)   . Knee fracture, right 03/05/2015   DIFFICULTY WITH AMBULATION  . Osteopenia   . Ovarian cyst   . Pancreatic insufficiency   . Wears hearing aid    bilateral    Past Surgical History:  Procedure Laterality Date  . CATARACT EXTRACTION W/PHACO Right 11/02/2015   Procedure: CATARACT EXTRACTION PHACO AND INTRAOCULAR LENS PLACEMENT (IOC);  Surgeon: Leandrew Koyanagi, MD;  Location: [facility];  Service: Ophthalmology;  Laterality: Right;  . CATARACT EXTRACTION W/PHACO Left 07/18/2016   Procedure: CATARACT EXTRACTION PHACO AND INTRAOCULAR LENS PLACEMENT (IOC);  Surgeon: Leandrew Koyanagi, MD;  Location: [facility];  Service: Ophthalmology;  Laterality: Left;  . COLONOSCOPY    . FRACTURE SURGERY Left    hip  . history of left hip fracture     . INTRAMEDULLARY (IM) NAIL INTERTROCHANTERIC Left 02/12/2017   Procedure: INTRAMEDULLARY (IM) NAIL INTERTROCHANTRIC;  Surgeon: Earnestine Leys, MD;  Location: [facility];  Service: Orthopedics;  Laterality: Left;  . kidney stent    . OOPHORECTOMY Bilateral 2015  . ORIF WRIST FRACTURE Right 04/08/2018   Procedure: OPEN REDUCTION INTERNAL FIXATION (ORIF) WRIST FRACTURE;  Surgeon: Altamese Marshfield, MD;  Location: [facility];  Service: Orthopedics;  Laterality: Right;  . TONSILLECTOMY    . TOTAL KNEE ARTHROPLASTY Right 08/07/2017   Procedure: TOTAL KNEE ARTHROPLASTY;  Surgeon: Earnestine Leys, MD;  Location: [facility];  Service: Orthopedics;  Laterality: Right;    There were no vitals filed for this visit.  Subjective Assessment - 09/04/18 1307    Subjective  Feels not too bad, not great. Does not have the fuzzy feeling at the moment. There is no certain time that the fuzzy feeling is going to happen.  5/10 B shoulder pain when she moves it.   R shoulder seems like it was worse after her most recent syncopal episode.     Pertinent History  Pt states being referred to PT for balance and gait. Gait and balance has been going dowhnill since December 2018 after her R TKA.  [facility] after being discharged from skilled nursing facility and  fractured the head of her L femur. Did not have surgery due to the fracture not being bad. Had a sycope episode March 2019. She was out all of a sudden. Had a total stroke work up and was within normal limits.  Was coming along alright. However, had another syncopal episode 04/03/2018. Pt was standing and talking to her neighbor. Does not remember the ambulance coming. She seemed like she had a stroke. Had a second stroke work up and the results were within normal limits. Took her longer to recover from her syncopal episode.  Currently taking plavix.  Currently taking the medication Kepra for seizzures. Pt states that when the episodes occur, there is no warning.   Had R wrist fracture ORIF on  04/08/2018.  Pt currently NWB R wrist.   Mainly uses her hurricain SPC.  Furniture walk at home. Also has a 3 wheeler and 4 wheeler rollator.  No falls other than due to her syncopal episodes. Pt states CPR can be performed if needed. Nothing long term. Can call ambulance if pt passes out or has a seizure. Neighbor was on her L side when talking to her prior to passing out.       Patient Stated Goals  Be able to walk better.    Currently in Pain?  Yes    Pain Score  5     Pain Onset  More than a month ago                               PT Education - 09/04/18 1311    Education Details  ther-ex    [employer]) Educated  Patient    Methods  Explanation;Demonstration;Tactile cues;Verbal cues    Comprehension  Returned demonstration;Verbalized understanding      Objectives  No latex band allergies   Pt states having dizzy spells about 3 years ago  MedbridgeAccess Code: [email or web address]   Gait: antalgic, decreased stance L LE, L femoral IR during L LE stance phase.  No pacemakers or defibrillators  Going to go with Dr. Gurney Maxin, MD for neurology.   Next Cardiologist appointment is in March 2020     Ther-ex  Blood pressure mechanically taken, normal cuff sitting: 92/53, HR 73. No dizziness.  Pt states feeling weak but not unusually weak.   Seated hip adduction ball and glute max squeeze 10x5 seconds for 2 sets  Seated heel toe raises 30x each way  Seated trunk flexion isometrics at neutral with PT manual resistance 10x5 seconds for 3 sets  Blood pressure L arm sitting after aforementioned exercises, normal cuff: 147/78, HR 81   Standing hip abduction with UE assist 10x2 to promote glute med muscle strength.   More difficult for R compared to L  Forward step up onto Air Ex pad with one UE assist 10x2 each LE to promote LE strength and balance  Blood pressure L arm standing, normal cuff after standing exercises: 159/101, HR  90  Seated bilateral scapular retraction 10x5 seconds for 3 sets  Seated L hip extension isometrics 10x5 seconds  Seated R shoulder IR isometrics 10x5 seconds for 2 sets  Good posterior translation of R humeral head palpated during exercise  Decreased R shoulder pain with flexion afterwards   Check goals next visit if appropriate.   Improved exercise technique, movement at target joints, use of target muscles after min to mod verbal, visual, tactile cues.   Worked on sitting exercises initially  to promote LE and trunk strength as well as to improve blood pressure levels due to low levels initially. Performed standing LE strengthening exercises afterwards to help improve ability to perform standing tasks such as walking. Pt systolic values seem to increase when pt is standing. No complain of headaches or dizziness throughout session.          PT Short Term Goals - 07/07/18 1403      PT SHORT TERM GOAL #1   Title  Patient will be independent with her HEP to promote balance and decrease fall risk.     Baseline  Pt states performing her exercises at home (07/07/2018)    Time  3    Period  Weeks    Status  Achieved    Target Date  06/26/18        PT Long Term Goals - 07/07/18 1426      PT LONG TERM GOAL #1   Title  Patient will improve B LE strength by at least 1/2 MMT grade to promote ability to perform standing tasks with decreased fall risk.     Time  9    Period  Weeks    Status  Partially Met    Target Date  09/11/18      PT LONG TERM GOAL #2   Title  Patient will improve TUG time to 12 seconds or less with use of SPC to promote functional mobility and decrease fall risk.     Baseline  18.4 seconds on average with SPC on L (06/03/2018); 16.06 seconds average with SPC (07/07/2018)    Time  9    Period  Weeks    Status  Partially Met    Target Date  09/11/18      PT LONG TERM GOAL #3   Title  Patient will improve FOTO score by at least 10 points as a demonstration  of improved function.     Baseline  Lower Leg FOTO 40 (06/03/2018); 41 (07/07/2018)    Time  9    Period  Weeks    Status  On-going    Target Date  09/11/18      PT LONG TERM GOAL #4   Title  Patient will be able to ambulate 100 ft or more with SBA to independent to promote mobility.     Baseline  Pt currently ambulates with SPC (06/03/2018); pt able to ambulate 200 ft without using SPC CGA. LOB x 1 with independent recovery (07/07/2018)    Time  9    Period  Weeks    Status  Partially Met    Target Date  09/11/18            Plan - 09/04/18 1312    Clinical Impression Statement  Worked on sitting exercises initially to promote LE and trunk strength as well as to improve blood pressure levels due to low levels initially. Performed standing LE strengthening exercises afterwards to help improve ability to perform standing tasks such as walking. Pt systolic values seem to increase when pt is standing. No complain of headaches or dizziness throughout session.     Rehab Potential  Fair    Clinical Impairments Affecting Rehab Potential  (-) age, hx of falls, weakness, medical history; (+) motivated    PT Frequency  2x / week    PT Duration  Other (comment)   9 weeks   PT Treatment/Interventions  Therapeutic activities;Therapeutic exercise;Neuromuscular re-education;Balance training;Patient/family education;Manual techniques;[place] needling;Aquatic Therapy;Gait training  PT Next Visit Plan  trunk, B hip and LE strengthening, balance, gait, manual techniques and modalities PRN.     Consulted and Agree with Plan of Care  Patient       Patient will benefit from skilled therapeutic intervention in order to improve the following deficits and impairments:  Postural dysfunction, Improper body mechanics, Difficulty walking, Decreased strength, Decreased balance, Abnormal gait  Visit Diagnosis: Muscle weakness (generalized)  History of falling  Difficulty in walking, not elsewhere  classified     Problem List Patient Active Problem List   Diagnosis Date Noted  . S/P ORIF (open reduction internal fixation) fracture radius and ulnar fx 04/08/18 04/10/2018  . Distal radius and ulna fracture, right   . History of CVA (cerebrovascular accident)   . Benign essential HTN   . Tachycardia   . Leukocytosis   . Seizures ([place])   . Stroke-like episode ([place]) s/p IV tPA 04/03/2018  . Syncope   . TIA (transient ischemic attack) 11/04/2017  . Malnutrition of moderate degree 08/26/2017  . Total knee replacement status 08/07/2017  . Closed left hip fracture ([place]) 02/12/2017  . HTN (hypertension) 02/12/2017  . HLD (hyperlipidemia) 02/12/2017     Joneen Boers PT, DPT  09/04/2018, 2:09 PM  [facility] [address], [place], [address] Phone: [phone]   Fax:  [phone]  Name: Kellie Harris MRN: [id number] Date of Birth: 05/04/1931

## 2018-09-08 ENCOUNTER — Ambulatory Visit: Payer: PPO

## 2018-09-08 DIAGNOSIS — M6281 Muscle weakness (generalized): Secondary | ICD-10-CM

## 2018-09-08 DIAGNOSIS — R262 Difficulty in walking, not elsewhere classified: Secondary | ICD-10-CM

## 2018-09-08 DIAGNOSIS — Z9181 History of falling: Secondary | ICD-10-CM

## 2018-09-08 NOTE — Therapy (Signed)
[facility] [address] [place], [address] Phone: [phone]   Fax:  [phone]  Physical Therapy Treatment  Patient Details  Name: Kellie Harris MRN: [id number] Date of Birth: 12/29/1930 Referring Provider (PT): Antony Contras, MD   Encounter Date: 09/08/2018  PT End of Session - 09/08/18 1401    Visit Number  17    Number of Visits  35    Date for PT Re-Evaluation  09/11/18    Authorization Type  8    Authorization Time Period  of 10 progress report    PT Start Time  1401   pt arrived late   PT Stop Time  1429    PT Time Calculation (min)  28 min    Equipment Utilized During Treatment  Gait belt    Activity Tolerance  Patient tolerated treatment well    Behavior During Therapy  WFL for tasks assessed/performed       Past Medical History:  Diagnosis Date  . Arthritis    right knee, left shoulder/ OSTEO  . Cancer ([place])    skin   . Degenerative disc disease, lumbar    L4-L5  . Diverticulosis    ITIS  . History of hiatal hernia   . Hydronephrosis    right  . Hypercholesteremia   . Hypertension    CONTROLLED ON MEDS  . IBS (irritable bowel syndrome)   . Knee fracture, right 03/05/2015   DIFFICULTY WITH AMBULATION  . Osteopenia   . Ovarian cyst   . Pancreatic insufficiency   . Wears hearing aid    bilateral    Past Surgical History:  Procedure Laterality Date  . CATARACT EXTRACTION W/PHACO Right 11/02/2015   Procedure: CATARACT EXTRACTION PHACO AND INTRAOCULAR LENS PLACEMENT (IOC);  Surgeon: Leandrew Koyanagi, MD;  Location: [facility];  Service: Ophthalmology;  Laterality: Right;  . CATARACT EXTRACTION W/PHACO Left 07/18/2016   Procedure: CATARACT EXTRACTION PHACO AND INTRAOCULAR LENS PLACEMENT (IOC);  Surgeon: Leandrew Koyanagi, MD;  Location: [facility];  Service: Ophthalmology;  Laterality: Left;  . COLONOSCOPY    . FRACTURE SURGERY Left    hip  . history of left  hip fracture    . INTRAMEDULLARY (IM) NAIL INTERTROCHANTERIC Left 02/12/2017   Procedure: INTRAMEDULLARY (IM) NAIL INTERTROCHANTRIC;  Surgeon: Earnestine Leys, MD;  Location: [facility];  Service: Orthopedics;  Laterality: Left;  . kidney stent    . OOPHORECTOMY Bilateral 2015  . ORIF WRIST FRACTURE Right 04/08/2018   Procedure: OPEN REDUCTION INTERNAL FIXATION (ORIF) WRIST FRACTURE;  Surgeon: Altamese , MD;  Location: [facility];  Service: Orthopedics;  Laterality: Right;  . TONSILLECTOMY    . TOTAL KNEE ARTHROPLASTY Right 08/07/2017   Procedure: TOTAL KNEE ARTHROPLASTY;  Surgeon: Earnestine Leys, MD;  Location: [facility];  Service: Orthopedics;  Laterality: Right;    There were no vitals filed for this visit.  Subjective Assessment - 09/08/18 1404    Subjective  Feels about the same. Just weak.     Pertinent History  Pt states being referred to PT for balance and gait. Gait and balance has been going dowhnill since December 2018 after her R TKA.  [facility] after being discharged from skilled nursing facility and fractured the head of her L femur. Did not have surgery due to the fracture not being bad. Had a sycope episode March 2019. She was out all of a sudden. Had a total stroke work up and was within normal limits.  Was coming along alright. However, had another syncopal episode 04/03/2018. Pt was standing and talking to her neighbor. Does not remember the ambulance coming. She seemed like she had a stroke. Had a second stroke work up and the results were within normal limits. Took her longer to recover from her syncopal episode.  Currently taking plavix.  Currently taking the medication Kepra for seizzures. Pt states that when the episodes occur, there is no warning.   Had R wrist fracture ORIF on 04/08/2018.  Pt currently NWB R wrist.   Mainly uses her hurricain SPC.  Furniture walk at home. Also has a 3 wheeler and 4 wheeler rollator.  No falls other than due to her syncopal episodes. Pt states CPR can be  performed if needed. Nothing long term. Can call ambulance if pt passes out or has a seizure. Neighbor was on her L side when talking to her prior to passing out.       Patient Stated Goals  Be able to walk better.    Currently in Pain?  Yes    Pain Score  7    B shoulder pain   Pain Onset  More than a month ago         [facility] PT Assessment - 09/08/18 1406      Strength   Right Hip Flexion  4+/5    Right Hip Extension  5/5   seated manually resisted   Right Hip ABduction  5/5   seated manually resisted clamshell   Left Hip Flexion  4+/5    Left Hip Extension  5/5   seated manually resisted   Left Hip ABduction  5/5   seated manually resisted clamshell   Right Knee Flexion  4+/5    Right Knee Extension  5/5    Left Knee Flexion  4/5    Left Knee Extension  4+/5                           PT Education - 09/08/18 1423    Education Details  ther-ex    Person(s) Educated  Patient    Methods  Explanation;Demonstration;Tactile cues;Verbal cues    Comprehension  Returned demonstration;Verbalized understanding      Objectives  No latex band allergies   Pt states having dizzy spells about 3 years ago  MedbridgeAccess Code: [email or web address]   Gait: antalgic, decreased stance L LE, L femoral IR during L LE stance phase.  No pacemakers or defibrillators  Going to go with Dr. Gurney Maxin, MD for neurology.  Next Cardiologist appointment is in March 2020     Ther-ex  Blood pressure mechanically taken, normal cuff sitting: 154/97, HR 74. No dizziness or headaches  Seated manually resisted hip flexion, knee flexion, knee extension, hip extension, clamshell isometrics 1-2x each way for each LE. Reviewed progress/current status with LE strength with pt.   Improved glute med and max strength.    Seated L hip extension isometrics 10x5 seconds   Seated L knee extension 10x5 seconds   Then with 2 lbs 5x to promote quadriceps  strength  Seated B shoulder ER yellow band with scapular retraction 3x5  No change in shoulder pain.   Standing up from a chair, walking 10 ft forward, then returning 10 ft, then sitting back onto chair 3x  With SPC: 15 seconds, 17 seconds, 15 seconds (15.67 seconds average). Improved time since last measured.   Try NuStep next visit if appropriate.  Improved exercise technique, movement at target joints, use of target muscles after mod verbal, visual, tactile cues.   Pt tolerated session well without aggravation of symptoms.     Improved B glute med and max strength and slight improved TUG time using her SPC since initial evaluation. Continued working on LE strengthening to promote ability to perform standing tasks. Pt tolerated session well without aggravation of symptoms. Pt will benefit from continued skilled physical therapy services to improve strength and function.          PT Short Term Goals - 07/07/18 1403      PT SHORT TERM GOAL #1   Title  Patient will be independent with her HEP to promote balance and decrease fall risk.     Baseline  Pt states performing her exercises at home (07/07/2018)    Time  3    Period  Weeks    Status  Achieved    Target Date  06/26/18        PT Long Term Goals - 07/07/18 1426      PT LONG TERM GOAL #1   Title  Patient will improve B LE strength by at least 1/2 MMT grade to promote ability to perform standing tasks with decreased fall risk.     Time  9    Period  Weeks    Status  Partially Met    Target Date  09/11/18      PT LONG TERM GOAL #2   Title  Patient will improve TUG time to 12 seconds or less with use of SPC to promote functional mobility and decrease fall risk.     Baseline  18.4 seconds on average with SPC on L (06/03/2018); 16.06 seconds average with SPC (07/07/2018)    Time  9    Period  Weeks    Status  Partially Met    Target Date  09/11/18      PT LONG TERM GOAL #3   Title  Patient will improve FOTO  score by at least 10 points as a demonstration of improved function.     Baseline  Lower Leg FOTO 40 (06/03/2018); 41 (07/07/2018)    Time  9    Period  Weeks    Status  On-going    Target Date  09/11/18      PT LONG TERM GOAL #4   Title  Patient will be able to ambulate 100 ft or more with SBA to independent to promote mobility.     Baseline  Pt currently ambulates with SPC (06/03/2018); pt able to ambulate 200 ft without using SPC CGA. LOB x 1 with independent recovery (07/07/2018)    Time  9    Period  Weeks    Status  Partially Met    Target Date  09/11/18            Plan - 09/08/18 1403    Clinical Impression Statement  Improved B glute med and max strength and slight improved TUG time using her SPC since initial evaluation. Continued working on LE strengthening to promote ability to perform standing tasks. Pt tolerated session well without aggravation of symptoms. Pt will benefit from continued skilled physical therapy services to improve strength and function.     Rehab Potential  Fair    Clinical Impairments Affecting Rehab Potential  (-) age, hx of falls, weakness, medical history; (+) motivated    PT Frequency  2x / week    PT Duration  Other (comment)  9 weeks   PT Treatment/Interventions  Therapeutic activities;Therapeutic exercise;Neuromuscular re-education;Balance training;Patient/family education;Manual techniques;[place] needling;Aquatic Therapy;Gait training    PT Next Visit Plan  trunk, B hip and LE strengthening, balance, gait, manual techniques and modalities PRN.     Consulted and Agree with Plan of Care  Patient       Patient will benefit from skilled therapeutic intervention in order to improve the following deficits and impairments:  Postural dysfunction, Improper body mechanics, Difficulty walking, Decreased strength, Decreased balance, Abnormal gait  Visit Diagnosis: Muscle weakness (generalized)  History of falling  Difficulty in walking, not  elsewhere classified     Problem List Patient Active Problem List   Diagnosis Date Noted  . S/P ORIF (open reduction internal fixation) fracture radius and ulnar fx 04/08/18 04/10/2018  . Distal radius and ulna fracture, right   . History of CVA (cerebrovascular accident)   . Benign essential HTN   . Tachycardia   . Leukocytosis   . Seizures ([place])   . Stroke-like episode ([place]) s/p IV tPA 04/03/2018  . Syncope   . TIA (transient ischemic attack) 11/04/2017  . Malnutrition of moderate degree 08/26/2017  . Total knee replacement status 08/07/2017  . Closed left hip fracture ([place]) 02/12/2017  . HTN (hypertension) 02/12/2017  . HLD (hyperlipidemia) 02/12/2017    Joneen Boers PT, DPT   09/08/2018, 6:41 PM   [facility] [address] [place], [address] Phone: [phone]   Fax:  [phone]  Name: Kellie Harris MRN: [id number] Date of Birth: 05/09/1931

## 2018-09-11 ENCOUNTER — Ambulatory Visit: Payer: PPO

## 2018-09-11 DIAGNOSIS — M6281 Muscle weakness (generalized): Secondary | ICD-10-CM | POA: Diagnosis not present

## 2018-09-11 DIAGNOSIS — R262 Difficulty in walking, not elsewhere classified: Secondary | ICD-10-CM

## 2018-09-11 DIAGNOSIS — Z9181 History of falling: Secondary | ICD-10-CM

## 2018-09-11 NOTE — Therapy (Addendum)
[facility] [address] [place], [address] Phone: [phone]   Fax:  [phone]  Physical Therapy Treatment And Progress Report (07/09/18 to 09/11/18)  Patient Details  Name: Kellie Harris MRN: [id number] Date of Birth: May 01, 1931  Referring Provider (PT): Antony Contras, MD   Encounter Date: 09/11/2018  PT End of Session - 09/11/18 1313    Visit Number  18    Number of Visits  47    Date for PT Re-Evaluation  10/23/18    Authorization Type  9    Authorization Time Period  of 10 progress report    PT Start Time  1315   pt arrived late   PT Stop Time  1345    PT Time Calculation (min)  30 min    Equipment Utilized During Treatment  Gait belt    Activity Tolerance  Patient tolerated treatment well    Behavior During Therapy  [facility] for tasks assessed/performed       Past Medical History:  Diagnosis Date  . Arthritis    right knee, left shoulder/ OSTEO  . Cancer ([place])    skin   . Degenerative disc disease, lumbar    L4-L5  . Diverticulosis    ITIS  . History of hiatal hernia   . Hydronephrosis    right  . Hypercholesteremia   . Hypertension    CONTROLLED ON MEDS  . IBS (irritable bowel syndrome)   . Knee fracture, right 03/05/2015   DIFFICULTY WITH AMBULATION  . Osteopenia   . Ovarian cyst   . Pancreatic insufficiency   . Wears hearing aid    bilateral    Past Surgical History:  Procedure Laterality Date  . CATARACT EXTRACTION W/PHACO Right 11/02/2015   Procedure: CATARACT EXTRACTION PHACO AND INTRAOCULAR LENS PLACEMENT (IOC);  Surgeon: Leandrew Koyanagi, MD;  Location: [place];  Service: Ophthalmology;  Laterality: Right;  . CATARACT EXTRACTION W/PHACO Left 07/18/2016   Procedure: CATARACT EXTRACTION PHACO AND INTRAOCULAR LENS PLACEMENT (IOC);  Surgeon: Leandrew Koyanagi, MD;  Location: [place];  Service: Ophthalmology;  Laterality: Left;  . COLONOSCOPY    .  FRACTURE SURGERY Left    hip  . history of left hip fracture    . INTRAMEDULLARY (IM) NAIL INTERTROCHANTERIC Left 02/12/2017   Procedure: INTRAMEDULLARY (IM) NAIL INTERTROCHANTRIC;  Surgeon: Earnestine Leys, MD;  Location: [facility];  Service: Orthopedics;  Laterality: Left;  . kidney stent    . OOPHORECTOMY Bilateral 2015  . ORIF WRIST FRACTURE Right 04/08/2018   Procedure: OPEN REDUCTION INTERNAL FIXATION (ORIF) WRIST FRACTURE;  Surgeon: Altamese Rosebud, MD;  Location: [place];  Service: Orthopedics;  Laterality: Right;  . TONSILLECTOMY    . TOTAL KNEE ARTHROPLASTY Right 08/07/2017   Procedure: TOTAL KNEE ARTHROPLASTY;  Surgeon: Earnestine Leys, MD;  Location: [facility];  Service: Orthopedics;  Laterality: Right;    There were no vitals filed for this visit.  Subjective Assessment - 09/11/18 1320    Subjective  Pt states that she was up and about yesterday and realized that she was not using her SPC. Did not notice being off balance. Pt states that she can usually touch furninture in her house.  Did more activities than usual yesterday.  Usually furniture walks at home but able to do more of that walking or being up and about at her home yesterday. The security system guy was working at her home for 4 hours yesterday.   Pt states  that not knowing what causes her syncope bothers her.  Feels like PT helped with strength.  Would like to continue.     Pertinent History  Pt states being referred to PT for balance and gait. Gait and balance has been going dowhnill since December 2018 after her R TKA.  [facility] after being discharged from skilled nursing facility and fractured the head of her L femur. Did not have surgery due to the fracture not being bad. Had a sycope episode March 2019. She was out all of a sudden. Had a total stroke work up and was within normal limits.  Was coming along alright. However, had another syncopal episode 04/03/2018. Pt was standing and talking to her neighbor. Does not remember the  ambulance coming. She seemed like she had a stroke. Had a second stroke work up and the results were within normal limits. Took her longer to recover from her syncopal episode.  Currently taking plavix.  Currently taking the medication Kepra for seizzures. Pt states that when the episodes occur, there is no warning.   Had R wrist fracture ORIF on 04/08/2018.  Pt currently NWB R wrist.   Mainly uses her hurricain SPC.  Furniture walk at home. Also has a 3 wheeler and 4 wheeler rollator.  No falls other than due to her syncopal episodes. Pt states CPR can be performed if needed. Nothing long term. Can call ambulance if pt passes out or has a seizure. Neighbor was on her L side when talking to her prior to passing out.       Patient Stated Goals  Be able to walk better.    Currently in Pain?  Yes    Pain Score  5    shoulder pain   Pain Onset  More than a month ago         [facility] PT Assessment - 09/11/18 1857      Strength   Overall Strength Comments  strength measured on 09/08/2018    Right Hip Flexion  4+/5    Right Hip Extension  5/5   seated manually resisted   Right Hip ABduction  5/5   seated manually resisted clamshell   Left Hip Flexion  4+/5    Left Hip Extension  5/5   seated manually resisted   Left Hip ABduction  5/5   seated manually resisted clamshell   Right Knee Flexion  4+/5    Right Knee Extension  5/5    Left Knee Flexion  4/5    Left Knee Extension  4+/5                           PT Education - 09/11/18 1856    Education Details  plan of care    Person(s) Educated  Patient    Methods  Explanation    Comprehension  Verbalized understanding      Objectives  No latex band allergies   Pt states having dizzy spells about 3 years ago  MedbridgeAccess Code: [email or web address]   Gait: antalgic, decreased stance L LE, L femoral IR during L LE stance phase.  No pacemakers or defibrillators    Next Cardiologist appointment is in March  2020     Ther-ex  Blood pressure manually taken, normal cuffsitting:160/84 L arm sitting. Pt states she got upset because her caregiver did not pick her up for PT in time. No lightheadedness, dizziness, headache.   Gait without use of SPC (but pt  holding it for safety). SBA, 200 ft. No LOB. Cues for increasing hip and knee flexion to promote foot clearance.    Reviewed progress/current status with PT towards goals  Reviewed plan of care: continue 2x/week for 6 weeks   Improved exercise technique, movement at target joints, use of target muscles after min verbal cues.   Pt demonstrates improved B glute med and max strength, slight improved TUG time using her SPC, improved ability to ambulate without use of her SPC, and some improved ability to perform functional tasks based on her FOTO score, as well as overall improved activity tolerance based on subjective reports. Pt is making some progress with PT towards goals. Patient will benefit from continued skilled physical therapy services to improve strength, and function. Challenges with progress include age as well as varying levels of blood pressure, in which standing tends to result in elevated diastolic blood pressure levels compared to sitting.        PT Short Term Goals - 07/07/18 1403      PT SHORT TERM GOAL #1   Title  Patient will be independent with her HEP to promote balance and decrease fall risk.     Baseline  Pt states performing her exercises at home (07/07/2018)    Time  3    Period  Weeks    Status  Achieved    Target Date  06/26/18        PT Long Term Goals - 09/11/18 1330      PT LONG TERM GOAL #1   Title  Patient will improve B LE strength by at least 1/2 MMT grade to promote ability to perform standing tasks with decreased fall risk.     Time  6    Period  Weeks    Status  Partially Met    Target Date  10/23/18      PT LONG TERM GOAL #2   Title  Patient will improve TUG time to 12 seconds or  less with use of SPC to promote functional mobility and decrease fall risk.     Baseline  18.4 seconds on average with SPC on L (06/03/2018); 16.06 seconds average with SPC (07/07/2018); 15.67 seconds average with SPC (09/08/2018)    Time  6    Period  Weeks    Status  Partially Met    Target Date  10/23/18      PT LONG TERM GOAL #3   Title  Patient will improve FOTO score by at least 10 points as a demonstration of improved function.     Baseline  Lower Leg FOTO 40 (06/03/2018); 41 (07/07/2018); 45 (09/11/2018)    Time  6    Period  Weeks    Status  Partially Met    Target Date  10/23/18      PT LONG TERM GOAL #4   Title  Patient will be able to ambulate 100 ft or more with SBA to independent to promote mobility.     Baseline  Pt currently ambulates with SPC (06/03/2018); pt able to ambulate 200 ft without using SPC CGA. LOB x 1 with independent recovery (07/07/2018). 200 ft without SPC, SBA, no LOB (09/11/2018)    Time  6    Period  Weeks    Status  Partially Met    Target Date  10/23/18            Plan - 09/11/18 1312    Clinical Impression Statement  Pt demonstrates improved B glute  med and max strength, slight improved TUG time using her SPC, improved ability to ambulate without use of her SPC, and some improved ability to perform functional tasks based on her FOTO score, as well as overall improved activity tolerance based on subjective reports. Pt is making some progress with PT towards goals. Patient will benefit from continued skilled physical therapy services to improve strength, and function. Challenges with progress include age as well as varying levels of blood pressure, in which standing tends to result in elevated diastolic blood pressure levels compared to sitting.     History and Personal Factors relevant to plan of care:  age, medical history, falls due to syncopal episodes which appear to come without warning, weakness, difficulty walking, difficulty performing  standing tasks unassisted.     Clinical Presentation  Stable    Clinical Presentation due to:  improving strength and activity tolerance (based on subjective reports)     Clinical Decision Making  Low    Rehab Potential  Fair    Clinical Impairments Affecting Rehab Potential  (-) age, hx of falls, weakness, medical history; (+) motivated    PT Frequency  2x / week    PT Duration  6 weeks    PT Treatment/Interventions  Therapeutic activities;Therapeutic exercise;Neuromuscular re-education;Balance training;Patient/family education;Manual techniques;Gato needling;Aquatic Therapy;Gait training    PT Next Visit Plan  trunk, B hip and LE strengthening, balance, gait, manual techniques and modalities PRN.     Consulted and Agree with Plan of Care  Patient       Patient will benefit from skilled therapeutic intervention in order to improve the following deficits and impairments:  Postural dysfunction, Improper body mechanics, Difficulty walking, Decreased strength, Decreased balance, Abnormal gait  Visit Diagnosis: Muscle weakness (generalized) - Plan: PT plan of care cert/re-cert  History of falling - Plan: PT plan of care cert/re-cert  Difficulty in walking, not elsewhere classified - Plan: PT plan of care cert/re-cert     Problem List Patient Active Problem List   Diagnosis Date Noted  . S/P ORIF (open reduction internal fixation) fracture radius and ulnar fx 04/08/18 04/10/2018  . Distal radius and ulna fracture, right   . History of CVA (cerebrovascular accident)   . Benign essential HTN   . Tachycardia   . Leukocytosis   . Seizures ([place])   . Stroke-like episode ([place]) s/p IV tPA 04/03/2018  . Syncope   . TIA (transient ischemic attack) 11/04/2017  . Malnutrition of moderate degree 08/26/2017  . Total knee replacement status 08/07/2017  . Closed left hip fracture ([place]) 02/12/2017  . HTN (hypertension) 02/12/2017  . HLD (hyperlipidemia) 02/12/2017   Thank you for your  referral.  Joneen Boers PT, DPT   09/11/2018, 7:25 PM  [facility] [address] [place], [address] Phone: [phone]   Fax:  [phone]  Name: ARTIS BUECHELE MRN: [id number] Date of Birth: 01-22-1931

## 2018-09-15 ENCOUNTER — Ambulatory Visit: Payer: PPO | Attending: Internal Medicine

## 2018-09-15 DIAGNOSIS — M6281 Muscle weakness (generalized): Secondary | ICD-10-CM | POA: Insufficient documentation

## 2018-09-15 DIAGNOSIS — Z9181 History of falling: Secondary | ICD-10-CM | POA: Diagnosis not present

## 2018-09-15 DIAGNOSIS — R262 Difficulty in walking, not elsewhere classified: Secondary | ICD-10-CM | POA: Diagnosis not present

## 2018-09-15 NOTE — Therapy (Signed)
[facility] [address] [place], [address] Phone: [phone]   Fax:  [phone]  Physical Therapy Treatment  Patient Details  Name: Kellie Harris MRN: [id number] Date of Birth: 19-Dec-1930 Referring Provider (PT): Antony Contras, MD   Encounter Date: 09/15/2018  PT End of Session - 09/15/18 1435    Visit Number  19    Number of Visits  47    Date for PT Re-Evaluation  10/23/18    Authorization Type  1    Authorization Time Period  of 10 progress report    PT Start Time  1435    PT Stop Time  1515    PT Time Calculation (min)  40 min    Equipment Utilized During Treatment  Gait belt    Activity Tolerance  Patient tolerated treatment well    Behavior During Therapy  WFL for tasks assessed/performed       Past Medical History:  Diagnosis Date  . Arthritis    right knee, left shoulder/ OSTEO  . Cancer ([place])    skin   . Degenerative disc disease, lumbar    L4-L5  . Diverticulosis    ITIS  . History of hiatal hernia   . Hydronephrosis    right  . Hypercholesteremia   . Hypertension    CONTROLLED ON MEDS  . IBS (irritable bowel syndrome)   . Knee fracture, right 03/05/2015   DIFFICULTY WITH AMBULATION  . Osteopenia   . Ovarian cyst   . Pancreatic insufficiency   . Wears hearing aid    bilateral    Past Surgical History:  Procedure Laterality Date  . CATARACT EXTRACTION W/PHACO Right 11/02/2015   Procedure: CATARACT EXTRACTION PHACO AND INTRAOCULAR LENS PLACEMENT (IOC);  Surgeon: Leandrew Koyanagi, MD;  Location: [place];  Service: Ophthalmology;  Laterality: Right;  . CATARACT EXTRACTION W/PHACO Left 07/18/2016   Procedure: CATARACT EXTRACTION PHACO AND INTRAOCULAR LENS PLACEMENT (IOC);  Surgeon: Leandrew Koyanagi, MD;  Location: [place];  Service: Ophthalmology;  Laterality: Left;  . COLONOSCOPY    . FRACTURE SURGERY Left    hip  . history of left hip fracture    .  INTRAMEDULLARY (IM) NAIL INTERTROCHANTERIC Left 02/12/2017   Procedure: INTRAMEDULLARY (IM) NAIL INTERTROCHANTRIC;  Surgeon: Earnestine Leys, MD;  Location: [facility];  Service: Orthopedics;  Laterality: Left;  . kidney stent    . OOPHORECTOMY Bilateral 2015  . ORIF WRIST FRACTURE Right 04/08/2018   Procedure: OPEN REDUCTION INTERNAL FIXATION (ORIF) WRIST FRACTURE;  Surgeon: Altamese [place], MD;  Location: [place];  Service: Orthopedics;  Laterality: Right;  . TONSILLECTOMY    . TOTAL KNEE ARTHROPLASTY Right 08/07/2017   Procedure: TOTAL KNEE ARTHROPLASTY;  Surgeon: Earnestine Leys, MD;  Location: [facility];  Service: Orthopedics;  Laterality: Right;    There were no vitals filed for this visit.  Subjective Assessment - 09/15/18 1438    Subjective  Felt a little better this morning. No feeling of weakness or blurred vision.     Pertinent History  Pt states being referred to PT for balance and gait. Gait and balance has been going dowhnill since December 2018 after her R TKA.  [facility] after being discharged from skilled nursing facility and fractured the head of her L femur. Did not have surgery due to the fracture not being bad. Had a sycope episode March 2019. She was out all of a sudden. Had a total stroke work up and was  within normal limits.  Was coming along alright. However, had another syncopal episode 04/03/2018. Pt was standing and talking to her neighbor. Does not remember the ambulance coming. She seemed like she had a stroke. Had a second stroke work up and the results were within normal limits. Took her longer to recover from her syncopal episode.  Currently taking plavix.  Currently taking the medication Kepra for seizzures. Pt states that when the episodes occur, there is no warning.   Had R wrist fracture ORIF on 04/08/2018.  Pt currently NWB R wrist.   Mainly uses her hurricain SPC.  Furniture walk at home. Also has a 3 wheeler and 4 wheeler rollator.  No falls other than due to her syncopal  episodes. Pt states CPR can be performed if needed. Nothing long term. Can call ambulance if pt passes out or has a seizure. Neighbor was on her L side when talking to her prior to passing out.       Patient Stated Goals  Be able to walk better.    Currently in Pain?  Yes    Pain Score  7    B shoulder pain   Pain Location  Shoulder    Pain Onset  More than a month ago                               PT Education - 09/15/18 1449    Education Details  ther-ex    Person(s) Educated  Patient    Methods  Explanation;Demonstration;Tactile cues;Verbal cues    Comprehension  Returned demonstration;Verbalized understanding        Objectives  No latex band allergies   Pt states having dizzy spells about 3 years ago  MedbridgeAccess Code: [email or web address]   Gait: antalgic, decreased stance L LE, L femoral IR during L LE stance phase.  No pacemakers or defibrillators   Next Cardiologist appointment is in March 2020     Ther-ex  Blood pressure mechanically taken, normal cuffstanding after 2 minutes: 151/91 L arm standing. HR 78  standing hip abduction 2 lbs with B UE assist  L 10x, then 5x  R 10x, then 5x  Seated B hip adduction ball and glute max squeeze 10x5 seconds for 2 sets  Standing B shoulder extension with scapular retraction 10x2  Standing straight pallof press resisting double yellow band 10x2  Standing LE leg press resisting double red band with B UE assist  L 10x2  R 10x2  Seated LAQ  R 10x5 seconds, then with 2 lbs 10x  L 10x5 seconds, then with 2 lbs 10x  Standing alternating toe taps onto treadmill platform with light touch assist 10x2 each LE to promote glute med strengthening and balance.    Improved exercise technique, movement at target joints, use of target muscles after min to mod verbal cues.   Good muscle use felt by pt with exercises. Pt tolerated session well without aggravation of symptoms.     Continued working on glute med and max strengthening to help decrease pelvic drop during gait as well as improve balance and decrease fall risk. Also worked on LE and trunk strengthening to promote ability to perform standing tasks with less difficulty. Pt tolerated session well without aggravation of symptoms. Pt will benefit from continued skilled physical therapy services to improve strength and function.            PT Short Term Goals - 07/07/18 1403  PT SHORT TERM GOAL #1   Title  Patient will be independent with her HEP to promote balance and decrease fall risk.     Baseline  Pt states performing her exercises at home (07/07/2018)    Time  3    Period  Weeks    Status  Achieved    Target Date  06/26/18        PT Long Term Goals - 09/11/18 1330      PT LONG TERM GOAL #1   Title  Patient will improve B LE strength by at least 1/2 MMT grade to promote ability to perform standing tasks with decreased fall risk.     Time  6    Period  Weeks    Status  Partially Met    Target Date  10/23/18      PT LONG TERM GOAL #2   Title  Patient will improve TUG time to 12 seconds or less with use of SPC to promote functional mobility and decrease fall risk.     Baseline  18.4 seconds on average with SPC on L (06/03/2018); 16.06 seconds average with SPC (07/07/2018); 15.67 seconds average with SPC (09/08/2018)    Time  6    Period  Weeks    Status  Partially Met    Target Date  10/23/18      PT LONG TERM GOAL #3   Title  Patient will improve FOTO score by at least 10 points as a demonstration of improved function.     Baseline  Lower Leg FOTO 40 (06/03/2018); 41 (07/07/2018); 45 (09/11/2018)    Time  6    Period  Weeks    Status  Partially Met    Target Date  10/23/18      PT LONG TERM GOAL #4   Title  Patient will be able to ambulate 100 ft or more with SBA to independent to promote mobility.     Baseline  Pt currently ambulates with SPC (06/03/2018); pt able to  ambulate 200 ft without using SPC CGA. LOB x 1 with independent recovery (07/07/2018). 200 ft without SPC, SBA, no LOB (09/11/2018)    Time  6    Period  Weeks    Status  Partially Met    Target Date  10/23/18            Plan - 09/15/18 1440    Clinical Impression Statement  Continued working on glute med and max strengthening to help decrease pelvic drop during gait as well as improve balance and decrease fall risk. Also worked on LE and trunk strengthening to promote ability to perform standing tasks with less difficulty. Pt tolerated session well without aggravation of symptoms. Pt will benefit from continued skilled physical therapy services to improve strength and function.     Rehab Potential  Fair    Clinical Impairments Affecting Rehab Potential  (-) age, hx of falls, weakness, medical history; (+) motivated    PT Frequency  2x / week    PT Duration  6 weeks    PT Treatment/Interventions  Therapeutic activities;Therapeutic exercise;Neuromuscular re-education;Balance training;Patient/family education;Manual techniques;Armenti needling;Aquatic Therapy;Gait training    PT Next Visit Plan  trunk, B hip and LE strengthening, balance, gait, manual techniques and modalities PRN.     Consulted and Agree with Plan of Care  Patient       Patient will benefit from skilled therapeutic intervention in order to improve the following deficits and impairments:  Postural dysfunction, Improper  body mechanics, Difficulty walking, Decreased strength, Decreased balance, Abnormal gait  Visit Diagnosis: Muscle weakness (generalized)  History of falling  Difficulty in walking, not elsewhere classified     Problem List Patient Active Problem List   Diagnosis Date Noted  . S/P ORIF (open reduction internal fixation) fracture radius and ulnar fx 04/08/18 04/10/2018  . Distal radius and ulna fracture, right   . History of CVA (cerebrovascular accident)   . Benign essential HTN   . Tachycardia   .  Leukocytosis   . Seizures ([place])   . Stroke-like episode ([place]) s/p IV tPA 04/03/2018  . Syncope   . TIA (transient ischemic attack) 11/04/2017  . Malnutrition of moderate degree 08/26/2017  . Total knee replacement status 08/07/2017  . Closed left hip fracture ([place]) 02/12/2017  . HTN (hypertension) 02/12/2017  . HLD (hyperlipidemia) 02/12/2017    Joneen Boers PT, DPT   09/15/2018, 3:29 PM  [facility] [address], [place], [address] Phone: [phone]   Fax:  [phone]  Name: SOREN PIGMAN MRN: [id number] Date of Birth: [birth year]

## 2018-09-18 ENCOUNTER — Ambulatory Visit: Payer: PPO

## 2018-09-22 ENCOUNTER — Ambulatory Visit: Payer: PPO

## 2018-09-22 DIAGNOSIS — M6281 Muscle weakness (generalized): Secondary | ICD-10-CM | POA: Diagnosis not present

## 2018-09-22 DIAGNOSIS — Z9181 History of falling: Secondary | ICD-10-CM

## 2018-09-22 DIAGNOSIS — R262 Difficulty in walking, not elsewhere classified: Secondary | ICD-10-CM

## 2018-09-22 NOTE — Therapy (Signed)
[facility] [address] [place], [address] Phone: [phone]   Fax:  [phone]  Physical Therapy Treatment  Patient Details  Name: Kellie Harris MRN: [id number] Date of Birth: Aug 26, 1930 Referring Provider (PT): Antony Contras, MD   Encounter Date: 09/22/2018  PT End of Session - 09/22/18 1438    Visit Number  20    Number of Visits  47    Date for PT Re-Evaluation  10/23/18    Authorization Type  2    Authorization Time Period  of 10 progress report    PT Start Time  1438    PT Stop Time  1524    PT Time Calculation (min)  46 min    Equipment Utilized During Treatment  Gait belt    Activity Tolerance  Patient tolerated treatment well    Behavior During Therapy  WFL for tasks assessed/performed       Past Medical History:  Diagnosis Date  . Arthritis    right knee, left shoulder/ OSTEO  . Cancer ([place])    skin   . Degenerative disc disease, lumbar    L4-L5  . Diverticulosis    ITIS  . History of hiatal hernia   . Hydronephrosis    right  . Hypercholesteremia   . Hypertension    CONTROLLED ON MEDS  . IBS (irritable bowel syndrome)   . Knee fracture, right 03/05/2015   DIFFICULTY WITH AMBULATION  . Osteopenia   . Ovarian cyst   . Pancreatic insufficiency   . Wears hearing aid    bilateral    Past Surgical History:  Procedure Laterality Date  . CATARACT EXTRACTION W/PHACO Right 11/02/2015   Procedure: CATARACT EXTRACTION PHACO AND INTRAOCULAR LENS PLACEMENT (IOC);  Surgeon: Leandrew Koyanagi, MD;  Location: [facility];  Service: Ophthalmology;  Laterality: Right;  . CATARACT EXTRACTION W/PHACO Left 07/18/2016   Procedure: CATARACT EXTRACTION PHACO AND INTRAOCULAR LENS PLACEMENT (IOC);  Surgeon: Leandrew Koyanagi, MD;  Location: [facility];  Service: Ophthalmology;  Laterality: Left;  . COLONOSCOPY    . FRACTURE SURGERY Left    hip  . history of left hip fracture     . INTRAMEDULLARY (IM) NAIL INTERTROCHANTERIC Left 02/12/2017   Procedure: INTRAMEDULLARY (IM) NAIL INTERTROCHANTRIC;  Surgeon: Earnestine Leys, MD;  Location: [facility];  Service: Orthopedics;  Laterality: Left;  . kidney stent    . OOPHORECTOMY Bilateral 2015  . ORIF WRIST FRACTURE Right 04/08/2018   Procedure: OPEN REDUCTION INTERNAL FIXATION (ORIF) WRIST FRACTURE;  Surgeon: Altamese [place] Villa, MD;  Location: [facility];  Service: Orthopedics;  Laterality: Right;  . TONSILLECTOMY    . TOTAL KNEE ARTHROPLASTY Right 08/07/2017   Procedure: TOTAL KNEE ARTHROPLASTY;  Surgeon: Earnestine Leys, MD;  Location: [facility];  Service: Orthopedics;  Laterality: Right;    There were no vitals filed for this visit.  Subjective Assessment - 09/22/18 1440    Subjective  Pt states moving slowly which is better than not moving. No blurred vision. Feels weak.     Pertinent History  Pt states being referred to PT for balance and gait. Gait and balance has been going dowhnill since December 2018 after her R TKA.  [facility] after being discharged from skilled nursing facility and fractured the head of her L femur. Did not have surgery due to the fracture not being bad. Had a sycope episode March 2019. She was out all of a sudden. Had a total stroke work up  and was within normal limits.  Was coming along alright. However, had another syncopal episode 04/03/2018. Pt was standing and talking to her neighbor. Does not remember the ambulance coming. She seemed like she had a stroke. Had a second stroke work up and the results were within normal limits. Took her longer to recover from her syncopal episode.  Currently taking plavix.  Currently taking the medication Kepra for seizzures. Pt states that when the episodes occur, there is no warning.   Had R wrist fracture ORIF on 04/08/2018.  Pt currently NWB R wrist.   Mainly uses her hurricain SPC.  Furniture walk at home. Also has a 3 wheeler and 4 wheeler rollator.  No falls other than due to her  syncopal episodes. Pt states CPR can be performed if needed. Nothing long term. Can call ambulance if pt passes out or has a seizure. Neighbor was on her L side when talking to her prior to passing out.       Patient Stated Goals  Be able to walk better.    Currently in Pain?  Other (Comment)    Pain Score  --   no complain of pain   Pain Onset  More than a month ago                               PT Education - 09/22/18 1442    Education Details  ther-ex    Person(s) Educated  Patient    Methods  Explanation;Demonstration;Tactile cues;Verbal cues    Comprehension  Returned demonstration;Verbalized understanding      Objectives  No latex band allergies   Pt states having dizzy spells about 3 years ago  MedbridgeAccess Code: [email or web address]   Gait: antalgic, decreased stance L LE, L femoral IR during L LE stance phase.  No pacemakers or defibrillators   Next Cardiologist appointment is in March 2020     Ther-ex  Blood pressure mechanicallytaken, normal cuff sitting, L arm 160/88, HR 69  Then after standing for 2 minutes 154/88, HR 97   standing hip abduction 2 lbs with B UE assist             L 10x,              R 10x,   Seated LAQ             R  2 lbs 10x3             L  2 lbs 10x3  Stepping over 4 mini hurdles with one UE assist 10x to promote balance  Side stepping 32 ft to the R and 32 ft to the L without SPC assist  Rest break provided secondary to fatigue. Pt states vision feels a little funny  Blood pressure L arm sitting, normal cuff, mechanically taken:  146/80, HR 72  Pt states that she feels like the funny feeling in her eyes is due to amlodipine (Norvasc). Notices the funny feeling in her eyes when she takes it.     Standing B shoulder extension with scapular retraction 10x with yellow band to promote thoracic extension and help decrease low back extension pressure  Shoulder discomfort  Then 10x5 seconds, no  resistance  Standing LE leg press resisting double red band with B UE assist    L 10x2             R 10x2  Funny feeling in eyes improving  per pt.     Improved exercise technique, movement at target joints, use of target muscles after min to mod verbal, visual, tactile cues.    Continued working on B LE strengthening such as the glute med, max, and quadriceps to promote femoral control when performing tasks such as sit <> stands as well as to promote ability to perform standing tasks with less difficulty and more balance. Good muscle use felt during exercises. Pt will benefit from continued skilled physical therapy services to improve strength and function.      PT Short Term Goals - 07/07/18 1403      PT SHORT TERM GOAL #1   Title  Patient will be independent with her HEP to promote balance and decrease fall risk.     Baseline  Pt states performing her exercises at home (07/07/2018)    Time  3    Period  Weeks    Status  Achieved    Target Date  06/26/18        PT Long Term Goals - 09/11/18 1330      PT LONG TERM GOAL #1   Title  Patient will improve B LE strength by at least 1/2 MMT grade to promote ability to perform standing tasks with decreased fall risk.     Time  6    Period  Weeks    Status  Partially Met    Target Date  10/23/18      PT LONG TERM GOAL #2   Title  Patient will improve TUG time to 12 seconds or less with use of SPC to promote functional mobility and decrease fall risk.     Baseline  18.4 seconds on average with SPC on L (06/03/2018); 16.06 seconds average with SPC (07/07/2018); 15.67 seconds average with SPC (09/08/2018)    Time  6    Period  Weeks    Status  Partially Met    Target Date  10/23/18      PT LONG TERM GOAL #3   Title  Patient will improve FOTO score by at least 10 points as a demonstration of improved function.     Baseline  Lower Leg FOTO 40 (06/03/2018); 41 (07/07/2018); 45 (09/11/2018)    Time  6    Period  Weeks    Status   Partially Met    Target Date  10/23/18      PT LONG TERM GOAL #4   Title  Patient will be able to ambulate 100 ft or more with SBA to independent to promote mobility.     Baseline  Pt currently ambulates with SPC (06/03/2018); pt able to ambulate 200 ft without using SPC CGA. LOB x 1 with independent recovery (07/07/2018). 200 ft without SPC, SBA, no LOB (09/11/2018)    Time  6    Period  Weeks    Status  Partially Met    Target Date  10/23/18            Plan - 09/22/18 1442    Clinical Impression Statement  Continued working on B LE strengthening such as the glute med, max, and quadriceps to promote femoral control when performing tasks such as sit <> stands as well as to promote ability to perform standing tasks with less difficulty and more balance. Good muscle use felt during exercises. Pt will benefit from continued skilled physical therapy services to improve strength and function    Rehab Potential  Fair    Clinical Impairments Affecting Rehab  Potential  (-) age, hx of falls, weakness, medical history; (+) motivated    PT Frequency  2x / week    PT Duration  6 weeks    PT Treatment/Interventions  Therapeutic activities;Therapeutic exercise;Neuromuscular re-education;Balance training;Patient/family education;Manual techniques;Eimers needling;Aquatic Therapy;Gait training    PT Next Visit Plan  trunk, B hip and LE strengthening, balance, gait, manual techniques and modalities PRN.     Consulted and Agree with Plan of Care  Patient       Patient will benefit from skilled therapeutic intervention in order to improve the following deficits and impairments:  Postural dysfunction, Improper body mechanics, Difficulty walking, Decreased strength, Decreased balance, Abnormal gait  Visit Diagnosis: Muscle weakness (generalized)  History of falling  Difficulty in walking, not elsewhere classified     Problem List Patient Active Problem List   Diagnosis Date Noted  . S/P ORIF (open  reduction internal fixation) fracture radius and ulnar fx 04/08/18 04/10/2018  . Distal radius and ulna fracture, right   . History of CVA (cerebrovascular accident)   . Benign essential HTN   . Tachycardia   . Leukocytosis   . Seizures ([place])   . Stroke-like episode ([place]) s/p IV tPA 04/03/2018  . Syncope   . TIA (transient ischemic attack) 11/04/2017  . Malnutrition of moderate degree 08/26/2017  . Total knee replacement status 08/07/2017  . Closed left hip fracture ([place]) 02/12/2017  . HTN (hypertension) 02/12/2017  . HLD (hyperlipidemia) 02/12/2017    Joneen Boers PT, DPT   09/22/2018, 3:38 PM  [facility] [address] [place], [address] Phone: [phone]   Fax:  [phone]  Name: LATASHIA KOCH MRN: [id number] Date of Birth: Mar 22, 1931

## 2018-09-24 ENCOUNTER — Ambulatory Visit: Payer: PPO

## 2018-09-24 DIAGNOSIS — Z9181 History of falling: Secondary | ICD-10-CM

## 2018-09-24 DIAGNOSIS — M6281 Muscle weakness (generalized): Secondary | ICD-10-CM | POA: Diagnosis not present

## 2018-09-24 DIAGNOSIS — R262 Difficulty in walking, not elsewhere classified: Secondary | ICD-10-CM

## 2018-09-24 NOTE — Therapy (Signed)
[facility] [address], [place], [address] Phone: [phone]   Fax:  [phone]  Physical Therapy Treatment  Patient Details  Name: Kellie Harris MRN: [id number] Date of Birth: 09-17-1930 Referring Provider (PT): Antony Contras, MD    Encounter Date: 09/24/2018  PT End of Session - 09/24/18 1347    Visit Number  21    Number of Visits  47    Date for PT Re-Evaluation  10/23/18    Authorization Type  3    Authorization Time Period  of 10 progress report    PT Start Time  1348    PT Stop Time  1432    PT Time Calculation (min)  44 min    Equipment Utilized During Treatment  Gait belt    Activity Tolerance  Patient tolerated treatment well    Behavior During Therapy  WFL for tasks assessed/performed       Past Medical History:  Diagnosis Date  . Arthritis    right knee, left shoulder/ OSTEO  . Cancer ([place])    skin   . Degenerative disc disease, lumbar    L4-L5  . Diverticulosis    ITIS  . History of hiatal hernia   . Hydronephrosis    right  . Hypercholesteremia   . Hypertension    CONTROLLED ON MEDS  . IBS (irritable bowel syndrome)   . Knee fracture, right 03/05/2015   DIFFICULTY WITH AMBULATION  . Osteopenia   . Ovarian cyst   . Pancreatic insufficiency   . Wears hearing aid    bilateral    Past Surgical History:  Procedure Laterality Date  . CATARACT EXTRACTION W/PHACO Right 11/02/2015   Procedure: CATARACT EXTRACTION PHACO AND INTRAOCULAR LENS PLACEMENT (IOC);  Surgeon: Leandrew Koyanagi, MD;  Location: [facility];  Service: Ophthalmology;  Laterality: Right;  . CATARACT EXTRACTION W/PHACO Left 07/18/2016   Procedure: CATARACT EXTRACTION PHACO AND INTRAOCULAR LENS PLACEMENT (IOC);  Surgeon: Leandrew Koyanagi, MD;  Location: [facility];  Service: Ophthalmology;  Laterality: Left;  . COLONOSCOPY    . FRACTURE SURGERY Left    hip  . history of left hip fracture     . INTRAMEDULLARY (IM) NAIL INTERTROCHANTERIC Left 02/12/2017   Procedure: INTRAMEDULLARY (IM) NAIL INTERTROCHANTRIC;  Surgeon: Earnestine Leys, MD;  Location: [facility];  Service: Orthopedics;  Laterality: Left;  . kidney stent    . OOPHORECTOMY Bilateral 2015  . ORIF WRIST FRACTURE Right 04/08/2018   Procedure: OPEN REDUCTION INTERNAL FIXATION (ORIF) WRIST FRACTURE;  Surgeon: Altamese Taylor, MD;  Location: [facility];  Service: Orthopedics;  Laterality: Right;  . TONSILLECTOMY    . TOTAL KNEE ARTHROPLASTY Right 08/07/2017   Procedure: TOTAL KNEE ARTHROPLASTY;  Surgeon: Earnestine Leys, MD;  Location: [facility];  Service: Orthopedics;  Laterality: Right;    There were no vitals filed for this visit.  Subjective Assessment - 09/24/18 1354    Subjective  Doing ok I hope. Weak I guess.    Pertinent History  Pt states being referred to PT for balance and gait. Gait and balance has been going dowhnill since December 2018 after her R TKA.  [facility] after being discharged from skilled nursing facility and fractured the head of her L femur. Did not have surgery due to the fracture not being bad. Had a sycope episode March 2019. She was out all of a sudden. Had a total stroke work up and was within normal limits.  Was coming  along alright. However, had another syncopal episode 04/03/2018. Pt was standing and talking to her neighbor. Does not remember the ambulance coming. She seemed like she had a stroke. Had a second stroke work up and the results were within normal limits. Took her longer to recover from her syncopal episode.  Currently taking plavix.  Currently taking the medication Kepra for seizzures. Pt states that when the episodes occur, there is no warning.   Had R wrist fracture ORIF on 04/08/2018.  Pt currently NWB R wrist.   Mainly uses her hurricain SPC.  Furniture walk at home. Also has a 3 wheeler and 4 wheeler rollator.  No falls other than due to her syncopal episodes. Pt states CPR can be performed if  needed. Nothing long term. Can call ambulance if pt passes out or has a seizure. Neighbor was on her L side when talking to her prior to passing out.       Patient Stated Goals  Be able to walk better.    Currently in Pain?  Other (Comment)    Pain Score  --   no complain of pain   Pain Onset  More than a month ago                               PT Education - 09/24/18 1707    Education Details  ther-ex    Person(s) Educated  Patient    Methods  Explanation;Demonstration;Tactile cues;Verbal cues    Comprehension  Returned demonstration;Verbalized understanding       Objectives  No latex band allergies   Pt states having dizzy spells about 3 years ago  MedbridgeAccess Code: [email or web address]   Gait: antalgic, decreased stance L LE, L femoral IR during L LE stance phase.  No pacemakers or defibrillators   Next Cardiologist appointment is in March 2020     Ther-ex  Blood pressuremechanicallytaken, normal cuff after 2 minutes of standing, L arm: 154/87, HR 84  Seated LAQ to promote quadriceps strength. Good muscle use felt.  R  2 lbs 10x3 L  2 lbs 10x3  Side stepping 32 ft to the R and 32 ft to the L without SPC assist to promote glute med muscle strengthening and balance.   Forward wedding march 32 ft x 2 without use of SPC to promote glute med muscle strengthening and balance.   Forward stepping over 6 mini hurdles with SPC 8x CGA to promote balance.    standing hip abduction 2 lbs with B UE assist to promote glute med strengthening and decrease lateral lean L 10x3  R 10x3  SLS with B UE assist to promote glute med muscle strengthening and decrease lateral lead during gait  R LE 2x5 seconds. R patalla discomfort  L LE 2x5 seconds    Improved exercise technique, movement at target joints, use of target muscles after min to  mod verbal, visual, tactile cues.   Manual  therapy  Seated STM R vastus lateralis to decrease lateral pull on patella   Decreased R patellar pain with SLS with B UE assist exercise   Good muscle use felt with exercises.     Continued working on LE strengthening and balance exercises to help improve function and decrease fall risk. Demonstrates R patellar pain with single leg stand activity which decreased following manual therapy to decrease vastus lateralis muscle tension to her patella. Pt will benefit from continued skilled physical therapy services to  decrease pain, improve strength, balance and function.                            PT Short Term Goals - 07/07/18 1403      PT SHORT TERM GOAL #1   Title  Patient will be independent with her HEP to promote balance and decrease fall risk.     Baseline  Pt states performing her exercises at home (07/07/2018)    Time  3    Period  Weeks    Status  Achieved    Target Date  06/26/18        PT Long Term Goals - 09/11/18 1330      PT LONG TERM GOAL #1   Title  Patient will improve B LE strength by at least 1/2 MMT grade to promote ability to perform standing tasks with decreased fall risk.     Time  6    Period  Weeks    Status  Partially Met    Target Date  10/23/18      PT LONG TERM GOAL #2   Title  Patient will improve TUG time to 12 seconds or less with use of SPC to promote functional mobility and decrease fall risk.     Baseline  18.4 seconds on average with SPC on L (06/03/2018); 16.06 seconds average with SPC (07/07/2018); 15.67 seconds average with SPC (09/08/2018)    Time  6    Period  Weeks    Status  Partially Met    Target Date  10/23/18      PT LONG TERM GOAL #3   Title  Patient will improve FOTO score by at least 10 points as a demonstration of improved function.     Baseline  Lower Leg FOTO 40 (06/03/2018); 41 (07/07/2018); 45 (09/11/2018)    Time  6    Period  Weeks    Status  Partially Met    Target Date  10/23/18      PT LONG TERM  GOAL #4   Title  Patient will be able to ambulate 100 ft or more with SBA to independent to promote mobility.     Baseline  Pt currently ambulates with SPC (06/03/2018); pt able to ambulate 200 ft without using SPC CGA. LOB x 1 with independent recovery (07/07/2018). 200 ft without SPC, SBA, no LOB (09/11/2018)    Time  6    Period  Weeks    Status  Partially Met    Target Date  10/23/18            Plan - 09/24/18 1346    Clinical Impression Statement  Continued working on LE strengthening and balance exercises to help improve function and decrease fall risk. Demonstrates R patellar pain with single leg stand activity which decreased following manual therapy to decrease vastus lateralis muscle tension to her patella. Pt will benefit from continued skilled physical therapy services to decrease pain, improve strength, balance and function.     Rehab Potential  Fair    Clinical Impairments Affecting Rehab Potential  (-) age, hx of falls, weakness, medical history; (+) motivated    PT Frequency  2x / week    PT Duration  6 weeks    PT Treatment/Interventions  Therapeutic activities;Therapeutic exercise;Neuromuscular re-education;Balance training;Patient/family education;Manual techniques;Duren needling;Aquatic Therapy;Gait training    PT Next Visit Plan  trunk, B hip and LE strengthening, balance, gait, manual techniques and modalities PRN.  Consulted and Agree with Plan of Care  Patient       Patient will benefit from skilled therapeutic intervention in order to improve the following deficits and impairments:  Postural dysfunction, Improper body mechanics, Difficulty walking, Decreased strength, Decreased balance, Abnormal gait  Visit Diagnosis: Muscle weakness (generalized)  History of falling  Difficulty in walking, not elsewhere classified     Problem List Patient Active Problem List   Diagnosis Date Noted  . S/P ORIF (open reduction internal fixation) fracture radius and  ulnar fx 04/08/18 04/10/2018  . Distal radius and ulna fracture, right   . History of CVA (cerebrovascular accident)   . Benign essential HTN   . Tachycardia   . Leukocytosis   . Seizures ([place])   . Stroke-like episode ([place]) s/p IV tPA 04/03/2018  . Syncope   . TIA (transient ischemic attack) 11/04/2017  . Malnutrition of moderate degree 08/26/2017  . Total knee replacement status 08/07/2017  . Closed left hip fracture ([place]) 02/12/2017  . HTN (hypertension) 02/12/2017  . HLD (hyperlipidemia) 02/12/2017     Joneen Boers PT, DPT  09/24/2018, 5:22 PM  [facility] [address] [place], [address] Phone: [phone]   Fax:  [phone]  Name: Kellie Harris MRN: [id number] Date of Birth: 1930-11-23

## 2018-09-29 ENCOUNTER — Ambulatory Visit: Payer: PPO

## 2018-09-29 DIAGNOSIS — Z9181 History of falling: Secondary | ICD-10-CM

## 2018-09-29 DIAGNOSIS — M6281 Muscle weakness (generalized): Secondary | ICD-10-CM

## 2018-09-29 DIAGNOSIS — R262 Difficulty in walking, not elsewhere classified: Secondary | ICD-10-CM

## 2018-09-29 NOTE — Therapy (Signed)
[facility] [address] [place], [address] Phone: [phone]   Fax:  [phone]  Physical Therapy Treatment  Patient Details  Name: Kellie Harris MRN: [id number] Date of Birth: 10-14-1930 Referring Provider (PT): Antony Contras, MD   Encounter Date: 09/29/2018  PT End of Session - 09/29/18 1438    Visit Number  22    Number of Visits  47    Date for PT Re-Evaluation  10/23/18    Authorization Type  4    Authorization Time Period  of 10 progress report    PT Start Time  1438   pt arrived late   PT Stop Time  1518    PT Time Calculation (min)  40 min    Equipment Utilized During Treatment  Gait belt    Activity Tolerance  Patient tolerated treatment well    Behavior During Therapy  [facility] for tasks assessed/performed       Past Medical History:  Diagnosis Date  . Arthritis    right knee, left shoulder/ OSTEO  . Cancer ([place])    skin   . Degenerative disc disease, lumbar    L4-L5  . Diverticulosis    ITIS  . History of hiatal hernia   . Hydronephrosis    right  . Hypercholesteremia   . Hypertension    CONTROLLED ON MEDS  . IBS (irritable bowel syndrome)   . Knee fracture, right 03/05/2015   DIFFICULTY WITH AMBULATION  . Osteopenia   . Ovarian cyst   . Pancreatic insufficiency   . Wears hearing aid    bilateral    Past Surgical History:  Procedure Laterality Date  . CATARACT EXTRACTION W/PHACO Right 11/02/2015   Procedure: CATARACT EXTRACTION PHACO AND INTRAOCULAR LENS PLACEMENT (IOC);  Surgeon: Leandrew Koyanagi, MD;  Location: [facility];  Service: Ophthalmology;  Laterality: Right;  . CATARACT EXTRACTION W/PHACO Left 07/18/2016   Procedure: CATARACT EXTRACTION PHACO AND INTRAOCULAR LENS PLACEMENT (IOC);  Surgeon: Leandrew Koyanagi, MD;  Location: [place];  Service: Ophthalmology;  Laterality: Left;  . COLONOSCOPY    . FRACTURE SURGERY Left    hip  . history of left  hip fracture    . INTRAMEDULLARY (IM) NAIL INTERTROCHANTERIC Left 02/12/2017   Procedure: INTRAMEDULLARY (IM) NAIL INTERTROCHANTRIC;  Surgeon: Earnestine Leys, MD;  Location: [facility];  Service: Orthopedics;  Laterality: Left;  . kidney stent    . OOPHORECTOMY Bilateral 2015  . ORIF WRIST FRACTURE Right 04/08/2018   Procedure: OPEN REDUCTION INTERNAL FIXATION (ORIF) WRIST FRACTURE;  Surgeon: Altamese Mahomet, MD;  Location: [place];  Service: Orthopedics;  Laterality: Right;  . TONSILLECTOMY    . TOTAL KNEE ARTHROPLASTY Right 08/07/2017   Procedure: TOTAL KNEE ARTHROPLASTY;  Surgeon: Earnestine Leys, MD;  Location: [facility];  Service: Orthopedics;  Laterality: Right;    There were no vitals filed for this visit.  Subjective Assessment - 09/29/18 1440    Subjective  Feels about the same    Pertinent History  Pt states being referred to PT for balance and gait. Gait and balance has been going dowhnill since December 2018 after her R TKA.  [facility] after being discharged from skilled nursing facility and fractured the head of her L femur. Did not have surgery due to the fracture not being bad. Had a sycope episode March 2019. She was out all of a sudden. Had a total stroke work up and was within normal limits.  Was coming  along alright. However, had another syncopal episode 04/03/2018. Pt was standing and talking to her neighbor. Does not remember the ambulance coming. She seemed like she had a stroke. Had a second stroke work up and the results were within normal limits. Took her longer to recover from her syncopal episode.  Currently taking plavix.  Currently taking the medication Kepra for seizzures. Pt states that when the episodes occur, there is no warning.   Had R wrist fracture ORIF on 04/08/2018.  Pt currently NWB R wrist.   Mainly uses her hurricain SPC.  Furniture walk at home. Also has a 3 wheeler and 4 wheeler rollator.  No falls other than due to her syncopal episodes. Pt states CPR can be performed if  needed. Nothing long term. Can call ambulance if pt passes out or has a seizure. Neighbor was on her L side when talking to her prior to passing out.       Patient Stated Goals  Be able to walk better.    Currently in Pain?  Yes    Pain Score  4    3-4/10 B shoulder when moving her arms   Pain Onset  More than a month ago                              Objectives  No latex band allergies   Pt states having dizzy spells about 3 years ago  MedbridgeAccess Code: [email or web address]   Gait: antalgic, decreased stance L LE, L femoral IR during L LE stance phase.  No pacemakers or defibrillators   Next Cardiologist appointment is in March 2020  Manual therapy   Seated STM R vastus lateralis to decrease lateral pull on patella                 Ther-ex  Blood pressuremechanicallytaken, normal cuffafter 2 minutes of standing, L arm: 160/83, HR 80   Forward stepping over 6 mini hurdles with SPC 8x CGA to promote balance.   SLS with B UE assist to promote glute med muscle strengthening and decrease lateral lead during gait             R LE 10x5 seconds, then 5x5 seconds             L LE 10x5 seconds, then 5x5 seconds   Able to perform more repetitions prior to R patellar discomfort compared to last session.    standing hip abduction 2 lbs with B UE assist to promote glute med strengthening and decrease lateral lean L 10x3  R 10x3  Seated LAQ to promote quadriceps strength. Good muscle use felt.  R 2 lbs 10x3 L 2 lbs 10x3  Seated hip extension isometrics to promote glute max muscle strengthening.   R 10x5 seconds for 2 sets  L 10x5 seconds for 2 sets  Work on increasing gait speed next session if appropriate   Improved exercise technique, movement at target joints, use of target muscles after min to  mod verbal, visual, tactile cues.    Good muscle use felt with exercises.     Continued working on glute and quadriceps strengthening to promote ability to perform standing tasks with less difficulty. Better able to tolerate SLS with B UE assist exercises today compared to previous session (more repetitions performed prior to onset of R patellar pain). Continued with STM to R vastus lateralis to help decrease R patellar discomfort with standing exercises. Pt will benefit  from continued skilled physical therapy services to improve strength and function.     PT Education - 09/29/18 1452    Education Details  ther-ex    Person(s) Educated  Patient    Methods  Explanation;Demonstration;Tactile cues;Verbal cues    Comprehension  Returned demonstration;Verbalized understanding       PT Short Term Goals - 07/07/18 1403      PT SHORT TERM GOAL #1   Title  Patient will be independent with her HEP to promote balance and decrease fall risk.     Baseline  Pt states performing her exercises at home (07/07/2018)    Time  3    Period  Weeks    Status  Achieved    Target Date  06/26/18        PT Long Term Goals - 09/11/18 1330      PT LONG TERM GOAL #1   Title  Patient will improve B LE strength by at least 1/2 MMT grade to promote ability to perform standing tasks with decreased fall risk.     Time  6    Period  Weeks    Status  Partially Met    Target Date  10/23/18      PT LONG TERM GOAL #2   Title  Patient will improve TUG time to 12 seconds or less with use of SPC to promote functional mobility and decrease fall risk.     Baseline  18.4 seconds on average with SPC on L (06/03/2018); 16.06 seconds average with SPC (07/07/2018); 15.67 seconds average with SPC (09/08/2018)    Time  6    Period  Weeks    Status  Partially Met    Target Date  10/23/18      PT LONG TERM GOAL #3   Title  Patient will improve FOTO score by at least 10 points as a demonstration of improved function.     Baseline  Lower Leg FOTO 40 (06/03/2018); 41 (07/07/2018); 45 (09/11/2018)     Time  6    Period  Weeks    Status  Partially Met    Target Date  10/23/18      PT LONG TERM GOAL #4   Title  Patient will be able to ambulate 100 ft or more with SBA to independent to promote mobility.     Baseline  Pt currently ambulates with SPC (06/03/2018); pt able to ambulate 200 ft without using SPC CGA. LOB x 1 with independent recovery (07/07/2018). 200 ft without SPC, SBA, no LOB (09/11/2018)    Time  6    Period  Weeks    Status  Partially Met    Target Date  10/23/18            Plan - 09/29/18 1452    Clinical Impression Statement  Continued working on glute and quadriceps strengthening to promote ability to perform standing tasks with less difficulty. Better able to tolerate SLS with B UE assist exercises today compared to previous session (more repetitions performed prior to onset of R patellar pain). Continued with STM to R vastus lateralis to help decrease R patellar discomfort with standing exercises. Pt will benefit from continued skilled physical therapy services to improve strength and function.     Rehab Potential  Fair    Clinical Impairments Affecting Rehab Potential  (-) age, hx of falls, weakness, medical history; (+) motivated    PT Frequency  2x / week    PT Duration  6  weeks    PT Treatment/Interventions  Therapeutic activities;Therapeutic exercise;Neuromuscular re-education;Balance training;Patient/family education;Manual techniques;[place]night needling;Aquatic Therapy;Gait training    PT Next Visit Plan  trunk, B hip and LE strengthening, balance, gait, manual techniques and modalities PRN.     Consulted and Agree with Plan of Care  Patient       Patient will benefit from skilled therapeutic intervention in order to improve the following deficits and impairments:  Postural dysfunction, Improper body mechanics, Difficulty walking, Decreased strength, Decreased balance, Abnormal gait  Visit Diagnosis: Muscle weakness (generalized)  History of  falling  Difficulty in walking, not elsewhere classified     Problem List Patient Active Problem List   Diagnosis Date Noted  . S/P ORIF (open reduction internal fixation) fracture radius and ulnar fx 04/08/18 04/10/2018  . Distal radius and ulna fracture, right   . History of CVA (cerebrovascular accident)   . Benign essential HTN   . Tachycardia   . Leukocytosis   . Seizures ([place])   . Stroke-like episode ([place]) s/p IV tPA 04/03/2018  . Syncope   . TIA (transient ischemic attack) 11/04/2017  . Malnutrition of moderate degree 08/26/2017  . Total knee replacement status 08/07/2017  . Closed left hip fracture ([place]) 02/12/2017  . HTN (hypertension) 02/12/2017  . HLD (hyperlipidemia) 02/12/2017    Joneen Boers PT, DPT   09/29/2018, 8:01 PM  [facility] [address], [place], [address] Phone: [phone]   Fax:  [phone]  Name: Kellie Harris MRN: [id number] Date of Birth: Feb 26, 1931

## 2018-10-06 ENCOUNTER — Ambulatory Visit: Payer: PPO

## 2018-10-06 DIAGNOSIS — R262 Difficulty in walking, not elsewhere classified: Secondary | ICD-10-CM

## 2018-10-06 DIAGNOSIS — Z9181 History of falling: Secondary | ICD-10-CM

## 2018-10-06 DIAGNOSIS — M6281 Muscle weakness (generalized): Secondary | ICD-10-CM

## 2018-10-06 NOTE — Therapy (Signed)
[facility] [address] [place], [address] Phone: [phone]   Fax:  [phone]  Physical Therapy Treatment  Patient Details  Name: Kellie Harris MRN: [id number] Date of Birth: 1930-10-01 Referring Provider (PT): Antony Contras, MD   Encounter Date: 10/06/2018  PT End of Session - 10/06/18 1431    Visit Number  23    Number of Visits  47    Date for PT Re-Evaluation  10/23/18    Authorization Type  5    Authorization Time Period  of 10 progress report    PT Start Time  1431    PT Stop Time  1512    PT Time Calculation (min)  41 min    Equipment Utilized During Treatment  Gait belt    Activity Tolerance  Patient tolerated treatment well    Behavior During Therapy  WFL for tasks assessed/performed       Past Medical History:  Diagnosis Date  . Arthritis    right knee, left shoulder/ OSTEO  . Cancer ([place])    skin   . Degenerative disc disease, lumbar    L4-L5  . Diverticulosis    ITIS  . History of hiatal hernia   . Hydronephrosis    right  . Hypercholesteremia   . Hypertension    CONTROLLED ON MEDS  . IBS (irritable bowel syndrome)   . Knee fracture, right 03/05/2015   DIFFICULTY WITH AMBULATION  . Osteopenia   . Ovarian cyst   . Pancreatic insufficiency   . Wears hearing aid    bilateral    Past Surgical History:  Procedure Laterality Date  . CATARACT EXTRACTION W/PHACO Right 11/02/2015   Procedure: CATARACT EXTRACTION PHACO AND INTRAOCULAR LENS PLACEMENT (IOC);  Surgeon: Leandrew Koyanagi, MD;  Location: [facility];  Service: Ophthalmology;  Laterality: Right;  . CATARACT EXTRACTION W/PHACO Left 07/18/2016   Procedure: CATARACT EXTRACTION PHACO AND INTRAOCULAR LENS PLACEMENT (IOC);  Surgeon: Leandrew Koyanagi, MD;  Location: [facility];  Service: Ophthalmology;  Laterality: Left;  . COLONOSCOPY    . FRACTURE SURGERY Left    hip  . history of left hip fracture     . INTRAMEDULLARY (IM) NAIL INTERTROCHANTERIC Left 02/12/2017   Procedure: INTRAMEDULLARY (IM) NAIL INTERTROCHANTRIC;  Surgeon: Earnestine Leys, MD;  Location: [facility];  Service: Orthopedics;  Laterality: Left;  . kidney stent    . OOPHORECTOMY Bilateral 2015  . ORIF WRIST FRACTURE Right 04/08/2018   Procedure: OPEN REDUCTION INTERNAL FIXATION (ORIF) WRIST FRACTURE;  Surgeon: Altamese Aurora, MD;  Location: [facility];  Service: Orthopedics;  Laterality: Right;  . TONSILLECTOMY    . TOTAL KNEE ARTHROPLASTY Right 08/07/2017   Procedure: TOTAL KNEE ARTHROPLASTY;  Surgeon: Earnestine Leys, MD;  Location: [facility];  Service: Orthopedics;  Laterality: Right;    There were no vitals filed for this visit.  Subjective Assessment - 10/06/18 1434    Subjective  Energy levels not so good.  R knee still bothers her. 3-4/10 R knee pain at the knee cap area when walking.     Pertinent History  Pt states being referred to PT for balance and gait. Gait and balance has been going dowhnill since December 2018 after her R TKA.  [facility] after being discharged from skilled nursing facility and fractured the head of her L femur. Did not have surgery due to the fracture not being bad. Had a sycope episode March 2019. She was out all of a  sudden. Had a total stroke work up and was within normal limits.  Was coming along alright. However, had another syncopal episode 04/03/2018. Pt was standing and talking to her neighbor. Does not remember the ambulance coming. She seemed like she had a stroke. Had a second stroke work up and the results were within normal limits. Took her longer to recover from her syncopal episode.  Currently taking plavix.  Currently taking the medication Kepra for seizzures. Pt states that when the episodes occur, there is no warning.   Had R wrist fracture ORIF on 04/08/2018.  Pt currently NWB R wrist.   Mainly uses her hurricain SPC.  Furniture walk at home. Also has a 3 wheeler and 4 wheeler rollator.  No falls  other than due to her syncopal episodes. Pt states CPR can be performed if needed. Nothing long term. Can call ambulance if pt passes out or has a seizure. Neighbor was on her L side when talking to her prior to passing out.       Patient Stated Goals  Be able to walk better.    Currently in Pain?  Yes    Pain Score  --    Pain Location  Knee    Pain Orientation  Right    Pain Onset  More than a month ago                               PT Education - 10/06/18 1436    Education Details  ther-ex    Person(s) Educated  Patient    Methods  Explanation;Demonstration;Tactile cues;Verbal cues    Comprehension  Returned demonstration;Verbalized understanding      Objectives  No latex band allergies   Pt states having dizzy spells about 3 years ago  MedbridgeAccess Code: [email or web address]   Gait: antalgic, decreased stance L LE, L femoral IR during L LE stance phase.  No pacemakers or defibrillators   Next Cardiologist appointment is in March 2020  Manual therapy   Seated STM R vastus lateralis to decrease lateral pull on patella    Seated STM R lateral hamstrings and IT band to decrease tension and promote better mechanics at her R knee joint.   Decreased R knee pain with gait afterwards    Ther-ex  Blood pressuremechanicallytaken, normal cuffafter 2 minutes of standing, L arm: 152/83, HR 75  Seated R hip ER 10x5 seconds for 3 sets  Reviewed and given as part of her HEP. Pt demonstrates and verbalized understanding. Handout provided.   Seated hip adduction ball squeeze and glute squeeze 10x5 seconds   SLS with B UE assistto promote glute med muscle strengthening and decrease lateral lead during gait R LE 10x5 seconds, then 5x5 seconds L LE 10x5 seconds, then 5x5 seconds  Ascending and descending 4 regular steps. Reciprocal pattern. CGA  B UE assist 1x  Then L rail assist 1x  Then L rail and opposite  SPC assist 1x   Improved exercise technique, movement at target joints, use of target muscles after min to mod verbal, visual, tactile cues.     Good muscle use felt with exercises. Decreased L knee pain following manual therapy. Pt tolerated session well without aggravation of symptoms.      Decreased R knee pain with treatment to decrease R lateral thigh muscle and tissue tightness to promote better mechanics at the knee and patella. Continued working on [employer], especially the glute med and  max to decrease R knee pain with walking as well as improve ability to perform standing tasks. Pt will benefit from continued skilled physical therapy services to improve strength, balance and function.          PT Short Term Goals - 07/07/18 1403      PT SHORT TERM GOAL #1   Title  Patient will be independent with her HEP to promote balance and decrease fall risk.     Baseline  Pt states performing her exercises at home (07/07/2018)    Time  3    Period  Weeks    Status  Achieved    Target Date  06/26/18        PT Long Term Goals - 09/11/18 1330      PT LONG TERM GOAL #1   Title  Patient will improve B LE strength by at least 1/2 MMT grade to promote ability to perform standing tasks with decreased fall risk.     Time  6    Period  Weeks    Status  Partially Met    Target Date  10/23/18      PT LONG TERM GOAL #2   Title  Patient will improve TUG time to 12 seconds or less with use of SPC to promote functional mobility and decrease fall risk.     Baseline  18.4 seconds on average with SPC on L (06/03/2018); 16.06 seconds average with SPC (07/07/2018); 15.67 seconds average with SPC (09/08/2018)    Time  6    Period  Weeks    Status  Partially Met    Target Date  10/23/18      PT LONG TERM GOAL #3   Title  Patient will improve FOTO score by at least 10 points as a demonstration of improved function.     Baseline  Lower Leg FOTO 40 (06/03/2018); 41 (07/07/2018); 45  (09/11/2018)    Time  6    Period  Weeks    Status  Partially Met    Target Date  10/23/18      PT LONG TERM GOAL #4   Title  Patient will be able to ambulate 100 ft or more with SBA to independent to promote mobility.     Baseline  Pt currently ambulates with SPC (06/03/2018); pt able to ambulate 200 ft without using SPC CGA. LOB x 1 with independent recovery (07/07/2018). 200 ft without SPC, SBA, no LOB (09/11/2018)    Time  6    Period  Weeks    Status  Partially Met    Target Date  10/23/18            Plan - 10/06/18 1436    Clinical Impression Statement  Decreased R knee pain with treatment to decrease R lateral thigh muscle and tissue tightness to promote better mechanics at the knee and patella. Continued working on LE strengtening, especially the glute med and max to decrease R knee pain with walking as well as improve ability to perform standing tasks. Pt will benefit from continued skilled physical therapy services to improve strength, balance and function.     Rehab Potential  Fair    Clinical Impairments Affecting Rehab Potential  (-) age, hx of falls, weakness, medical history; (+) motivated    PT Frequency  2x / week    PT Duration  6 weeks    PT Treatment/Interventions  Therapeutic activities;Therapeutic exercise;Neuromuscular re-education;Balance training;Patient/family education;Manual techniques;Tally needling;Aquatic Therapy;Gait training    PT  Next Visit Plan  trunk, B hip and LE strengthening, balance, gait, manual techniques and modalities PRN.     Consulted and Agree with Plan of Care  Patient       Patient will benefit from skilled therapeutic intervention in order to improve the following deficits and impairments:  Postural dysfunction, Improper body mechanics, Difficulty walking, Decreased strength, Decreased balance, Abnormal gait  Visit Diagnosis: Muscle weakness (generalized)  History of falling  Difficulty in walking, not elsewhere  classified     Problem List Patient Active Problem List   Diagnosis Date Noted  . S/P ORIF (open reduction internal fixation) fracture radius and ulnar fx 04/08/18 04/10/2018  . Distal radius and ulna fracture, right   . History of CVA (cerebrovascular accident)   . Benign essential HTN   . Tachycardia   . Leukocytosis   . Seizures ([place])   . Stroke-like episode ([place]) s/p IV tPA 04/03/2018  . Syncope   . TIA (transient ischemic attack) 11/04/2017  . Malnutrition of moderate degree 08/26/2017  . Total knee replacement status 08/07/2017  . Closed left hip fracture ([place]) 02/12/2017  . HTN (hypertension) 02/12/2017  . HLD (hyperlipidemia) 02/12/2017    Joneen Boers PT, DPT   10/06/2018, 7:42 PM  [facility] [address], [place], [address] Phone: [phone]   Fax:  [phone]  Name: Kellie Harris MRN: [id number] Date of Birth: [birth year]

## 2018-10-06 NOTE — Patient Instructions (Signed)
MedbridgeAccess Code: [email or web address]  Seated Hip External Rotation AROM  R hip 10x3 with 5 second holds

## 2018-10-09 ENCOUNTER — Ambulatory Visit: Payer: PPO

## 2018-10-09 DIAGNOSIS — M6281 Muscle weakness (generalized): Secondary | ICD-10-CM

## 2018-10-09 DIAGNOSIS — R262 Difficulty in walking, not elsewhere classified: Secondary | ICD-10-CM

## 2018-10-09 DIAGNOSIS — Z9181 History of falling: Secondary | ICD-10-CM

## 2018-10-09 NOTE — Therapy (Signed)
[facility] [address], [place], [address] Phone: [phone]   Fax:  [phone]  Physical Therapy Treatment  Patient Details  Name: Kellie Harris MRN: [id number] Date of Birth: 05/23/1931 Referring Provider (PT): Antony Contras, MD   Encounter Date: 10/09/2018  PT End of Session - 10/09/18 1353    Visit Number  24    Number of Visits  47    Date for PT Re-Evaluation  10/23/18    Authorization Type  6    Authorization Time Period  of 10 progress report    PT Start Time  1354    PT Stop Time  1437    PT Time Calculation (min)  43 min    Equipment Utilized During Treatment  Gait belt    Activity Tolerance  Patient tolerated treatment well    Behavior During Therapy  [facility] for tasks assessed/performed       Past Medical History:  Diagnosis Date  . Arthritis    right knee, left shoulder/ OSTEO  . Cancer ([place])    skin   . Degenerative disc disease, lumbar    L4-L5  . Diverticulosis    ITIS  . History of hiatal hernia   . Hydronephrosis    right  . Hypercholesteremia   . Hypertension    CONTROLLED ON MEDS  . IBS (irritable bowel syndrome)   . Knee fracture, right 03/05/2015   DIFFICULTY WITH AMBULATION  . Osteopenia   . Ovarian cyst   . Pancreatic insufficiency   . Wears hearing aid    bilateral    Past Surgical History:  Procedure Laterality Date  . CATARACT EXTRACTION W/PHACO Right 11/02/2015   Procedure: CATARACT EXTRACTION PHACO AND INTRAOCULAR LENS PLACEMENT (IOC);  Surgeon: Leandrew Koyanagi, MD;  Location: [place];  Service: Ophthalmology;  Laterality: Right;  . CATARACT EXTRACTION W/PHACO Left 07/18/2016   Procedure: CATARACT EXTRACTION PHACO AND INTRAOCULAR LENS PLACEMENT (IOC);  Surgeon: Leandrew Koyanagi, MD;  Location: [place];  Service: Ophthalmology;  Laterality: Left;  . COLONOSCOPY    . FRACTURE SURGERY Left    hip  . history of left hip fracture     . INTRAMEDULLARY (IM) NAIL INTERTROCHANTERIC Left 02/12/2017   Procedure: INTRAMEDULLARY (IM) NAIL INTERTROCHANTRIC;  Surgeon: Earnestine Leys, MD;  Location: [facility];  Service: Orthopedics;  Laterality: Left;  . kidney stent    . OOPHORECTOMY Bilateral 2015  . ORIF WRIST FRACTURE Right 04/08/2018   Procedure: OPEN REDUCTION INTERNAL FIXATION (ORIF) WRIST FRACTURE;  Surgeon: Altamese Buellton, MD;  Location: [place];  Service: Orthopedics;  Laterality: Right;  . TONSILLECTOMY    . TOTAL KNEE ARTHROPLASTY Right 08/07/2017   Procedure: TOTAL KNEE ARTHROPLASTY;  Surgeon: Earnestine Leys, MD;  Location: [facility];  Service: Orthopedics;  Laterality: Right;    There were no vitals filed for this visit.  Subjective Assessment - 10/09/18 1354    Subjective  Feels about the same. R knee still hurts.     Pertinent History  Pt states being referred to PT for balance and gait. Gait and balance has been going dowhnill since December 2018 after her R TKA.  [facility] after being discharged from skilled nursing facility and fractured the head of her L femur. Did not have surgery due to the fracture not being bad. Had a sycope episode March 2019. She was out all of a sudden. Had a total stroke work up and was within normal limits.  Was  coming along alright. However, had another syncopal episode 04/03/2018. Pt was standing and talking to her neighbor. Does not remember the ambulance coming. She seemed like she had a stroke. Had a second stroke work up and the results were within normal limits. Took her longer to recover from her syncopal episode.  Currently taking plavix.  Currently taking the medication Kepra for seizzures. Pt states that when the episodes occur, there is no warning.   Had R wrist fracture ORIF on 04/08/2018.  Pt currently NWB R wrist.   Mainly uses her hurricain SPC.  Furniture walk at home. Also has a 3 wheeler and 4 wheeler rollator.  No falls other than due to her syncopal episodes. Pt states CPR can be  performed if needed. Nothing long term. Can call ambulance if pt passes out or has a seizure. Neighbor was on her L side when talking to her prior to passing out.       Patient Stated Goals  Be able to walk better.    Currently in Pain?  Yes    Pain Score  5    guesses 4-5/10 R knee pain when walking   Pain Onset  More than a month ago                               PT Education - 10/09/18 1357    Education Details  ther-ex    [employer]) Educated  Patient    Methods  Explanation;Demonstration;Tactile cues;Verbal cues    Comprehension  Returned demonstration;Verbalized understanding      Objectives  No latex band allergies   Pt states having dizzy spells about 3 years ago  MedbridgeAccess Code: [email or web address]   Gait: antalgic, decreased stance L LE, L femoral IR during L LE stance phase.  No pacemakers or defibrillators   Next Cardiologist appointment is in March 2020   Ther-ex  Seated R hip ER 10x5 seconds for 2 sets         then with 1 lbs at ankle 10x5 seconds  Lateral step downs with B UE assist, 4 inch step  R LE 10x2  Standing R hip abduction 3 lbs at ankle with B UE assist 5x3   Stepping over 6 hurdles with SPC 6x, CGA to SBA  Forward step up onto 1st regular step with B UE assist  R 5x2  L 5x2   Ascending and descending 4 regular steps. Reciprocal pattern. CGA             L rail assist 4x. SBA on last repetition.   Cues for femoral control   Side stepping without UE assist 20 ft to the R and 20 ft to the L CGA for 2 sets  Forward wedding march 20 ft x 2 no UE assist, CGA  Standing mini squats without UE assist 5x2    Improved exercise technique, movement at target joints, use of target muscles after min to mod verbal, visual, tactile cues.   Response to treatment Pt tolerated session well without aggravation of R knee pain. Good muscle use felt with exercises. No LOB with exercises without UE assist.     Clinical imprression Pt able to ascend and descend 4 regular steps with L rail assist 4x with overall improved femoral control observed. Decreased lateral lean observed today with gait without SPC after performing exercises.  Continued working on LE strengthening to promote ability to perform standing tasks such as ambulation  and stair negotiation with less difficulty. Pt will benefit from continued skilled physical therapy services to improve strength and function.      PT Short Term Goals - 07/07/18 1403      PT SHORT TERM GOAL #1   Title  Patient will be independent with her HEP to promote balance and decrease fall risk.     Baseline  Pt states performing her exercises at home (07/07/2018)    Time  3    Period  Weeks    Status  Achieved    Target Date  06/26/18        PT Long Term Goals - 09/11/18 1330      PT LONG TERM GOAL #1   Title  Patient will improve B LE strength by at least 1/2 MMT grade to promote ability to perform standing tasks with decreased fall risk.     Time  6    Period  Weeks    Status  Partially Met    Target Date  10/23/18      PT LONG TERM GOAL #2   Title  Patient will improve TUG time to 12 seconds or less with use of SPC to promote functional mobility and decrease fall risk.     Baseline  18.4 seconds on average with SPC on L (06/03/2018); 16.06 seconds average with SPC (07/07/2018); 15.67 seconds average with SPC (09/08/2018)    Time  6    Period  Weeks    Status  Partially Met    Target Date  10/23/18      PT LONG TERM GOAL #3   Title  Patient will improve FOTO score by at least 10 points as a demonstration of improved function.     Baseline  Lower Leg FOTO 40 (06/03/2018); 41 (07/07/2018); 45 (09/11/2018)    Time  6    Period  Weeks    Status  Partially Met    Target Date  10/23/18      PT LONG TERM GOAL #4   Title  Patient will be able to ambulate 100 ft or more with SBA to independent to promote mobility.     Baseline  Pt currently  ambulates with SPC (06/03/2018); pt able to ambulate 200 ft without using SPC CGA. LOB x 1 with independent recovery (07/07/2018). 200 ft without SPC, SBA, no LOB (09/11/2018)    Time  6    Period  Weeks    Status  Partially Met    Target Date  10/23/18            Plan - 10/09/18 1357    Clinical Impression Statement  Pt able to ascend and descend 4 regular steps with L rail assist 4x with overall improved femoral control observed. Decreased lateral lean observed today with gait without SPC after performing exercises.  Continued working on LE strengthening to promote ability to perform standing tasks such as ambulation and stair negotiation with less difficulty. Pt will benefit from continued skilled physical therapy services to improve strength and function.     Rehab Potential  Fair    Clinical Impairments Affecting Rehab Potential  (-) age, hx of falls, weakness, medical history; (+) motivated    PT Frequency  2x / week    PT Duration  6 weeks    PT Treatment/Interventions  Therapeutic activities;Therapeutic exercise;Neuromuscular re-education;Balance training;Patient/family education;Manual techniques;[place] needling;Aquatic Therapy;Gait training    PT Next Visit Plan  trunk, B hip and LE strengthening, balance, gait, manual techniques  and modalities PRN.     Consulted and Agree with Plan of Care  Patient       Patient will benefit from skilled therapeutic intervention in order to improve the following deficits and impairments:  Postural dysfunction, Improper body mechanics, Difficulty walking, Decreased strength, Decreased balance, Abnormal gait  Visit Diagnosis: Muscle weakness (generalized)  History of falling  Difficulty in walking, not elsewhere classified     Problem List Patient Active Problem List   Diagnosis Date Noted  . S/P ORIF (open reduction internal fixation) fracture radius and ulnar fx 04/08/18 04/10/2018  . Distal radius and ulna fracture, right   . History  of CVA (cerebrovascular accident)   . Benign essential HTN   . Tachycardia   . Leukocytosis   . Seizures ([place])   . Stroke-like episode ([place]) s/p IV tPA 04/03/2018  . Syncope   . TIA (transient ischemic attack) 11/04/2017  . Malnutrition of moderate degree 08/26/2017  . Total knee replacement status 08/07/2017  . Closed left hip fracture ([place]) 02/12/2017  . HTN (hypertension) 02/12/2017  . HLD (hyperlipidemia) 02/12/2017    Joneen Boers PT, DPT   10/09/2018, 2:55 PM  [facility] [address] [place], [address] Phone: [phone]   Fax:  [phone]  Name: JALAYNE GANESH MRN: [id number] Date of Birth: 1931/05/16

## 2018-10-14 ENCOUNTER — Ambulatory Visit: Payer: PPO | Attending: Internal Medicine

## 2018-10-14 DIAGNOSIS — M6281 Muscle weakness (generalized): Secondary | ICD-10-CM | POA: Insufficient documentation

## 2018-10-14 DIAGNOSIS — R262 Difficulty in walking, not elsewhere classified: Secondary | ICD-10-CM | POA: Insufficient documentation

## 2018-10-14 DIAGNOSIS — Z9181 History of falling: Secondary | ICD-10-CM | POA: Insufficient documentation

## 2018-10-14 NOTE — Therapy (Signed)
[facility] [address] [place], [address] Phone: [phone]   Fax:  [phone]  Physical Therapy Treatment  Patient Details  Name: Kellie Harris MRN: [id number] Date of Birth: 25-Jan-1931 Referring Provider (PT): Antony Contras, MD   Encounter Date: 10/14/2018  PT End of Session - 10/14/18 1350    Visit Number  25    Number of Visits  47    Date for PT Re-Evaluation  10/23/18    Authorization Type  7    Authorization Time Period  of 10 progress report    PT Start Time  1351    PT Stop Time  1426    PT Time Calculation (min)  35 min    Equipment Utilized During Treatment  Gait belt    Activity Tolerance  Patient tolerated treatment well    Behavior During Therapy  WFL for tasks assessed/performed       Past Medical History:  Diagnosis Date  . Arthritis    right knee, left shoulder/ OSTEO  . Cancer ([place])    skin   . Degenerative disc disease, lumbar    L4-L5  . Diverticulosis    ITIS  . History of hiatal hernia   . Hydronephrosis    right  . Hypercholesteremia   . Hypertension    CONTROLLED ON MEDS  . IBS (irritable bowel syndrome)   . Knee fracture, right 03/05/2015   DIFFICULTY WITH AMBULATION  . Osteopenia   . Ovarian cyst   . Pancreatic insufficiency   . Wears hearing aid    bilateral    Past Surgical History:  Procedure Laterality Date  . CATARACT EXTRACTION W/PHACO Right 11/02/2015   Procedure: CATARACT EXTRACTION PHACO AND INTRAOCULAR LENS PLACEMENT (IOC);  Surgeon: Leandrew Koyanagi, MD;  Location: [place];  Service: Ophthalmology;  Laterality: Right;  . CATARACT EXTRACTION W/PHACO Left 07/18/2016   Procedure: CATARACT EXTRACTION PHACO AND INTRAOCULAR LENS PLACEMENT (IOC);  Surgeon: Leandrew Koyanagi, MD;  Location: [place];  Service: Ophthalmology;  Laterality: Left;  . COLONOSCOPY    . FRACTURE SURGERY Left    hip  . history of left hip fracture    .  INTRAMEDULLARY (IM) NAIL INTERTROCHANTERIC Left 02/12/2017   Procedure: INTRAMEDULLARY (IM) NAIL INTERTROCHANTRIC;  Surgeon: Earnestine Leys, MD;  Location: [facility];  Service: Orthopedics;  Laterality: Left;  . kidney stent    . OOPHORECTOMY Bilateral 2015  . ORIF WRIST FRACTURE Right 04/08/2018   Procedure: OPEN REDUCTION INTERNAL FIXATION (ORIF) WRIST FRACTURE;  Surgeon: Altamese Emmons, MD;  Location: [place];  Service: Orthopedics;  Laterality: Right;  . TONSILLECTOMY    . TOTAL KNEE ARTHROPLASTY Right 08/07/2017   Procedure: TOTAL KNEE ARTHROPLASTY;  Surgeon: Earnestine Leys, MD;  Location: [facility];  Service: Orthopedics;  Laterality: Right;    There were no vitals filed for this visit.  Subjective Assessment - 10/14/18 1351    Subjective  Felt pretty good after last session. [facility] after [facility]. Felt weak when [facility] entered [facility]. Legs felt weak. Did not notice her arms getting weak. Pt was pushing a cart.  Pt fell onto her butt.  Pt did not Harris consciousness, could answer questions and was with it. She knew what was going on.  Did not want to call EMS. Does not think she hurt anything but her R knee bothers her.  Has been using tylenol and ice for her knee. No bruising, no swelling.  Did  not land on her knee when she fell.  Back and hips do not hurt.  Shoulders hurt just a little more than usual.  Did not call her doctor.  Still feels weak. Feels similar to the other spells she had.   Does not think she broke anything.  Called her neurologist afterwards. Was told to get a routine EEG before a 4 hour EEG.  Pt felt light headed and weak all over around the time of the fall.  Pt states that this happens every 6 months     Pertinent History  Pt states being referred to PT for balance and gait. Gait and balance has been going dowhnill since December 2018 after her R TKA.  [facility] after being discharged from skilled nursing facility and fractured the head of her L femur. Did not have surgery due to  the fracture not being bad. Had a sycope episode March 2019. She was out all of a sudden. Had a total stroke work up and was within normal limits.  Was coming along alright. However, had another syncopal episode 04/03/2018. Pt was standing and talking to her neighbor. Does not remember the ambulance coming. She seemed like she had a stroke. Had a second stroke work up and the results were within normal limits. Took her longer to recover from her syncopal episode.  Currently taking plavix.  Currently taking the medication Kepra for seizzures. Pt states that when the episodes occur, there is no warning.   Had R wrist fracture ORIF on 04/08/2018.  Pt currently NWB R wrist.   Mainly uses her hurricain SPC.  Furniture walk at home. Also has a 3 wheeler and 4 wheeler rollator.  No falls other than due to her syncopal episodes. Pt states CPR can be performed if needed. Nothing long term. Can call ambulance if pt passes out or has a seizure. Neighbor was on her L side when talking to her prior to passing out.       Patient Stated Goals  Be able to walk better.    Currently in Pain?  Yes    Pain Score  5    R knee pain when walking, took tylenol    Pain Onset  More than a month ago                               PT Education - 10/14/18 1433    Education Details  Pt was recommended to get checked out by cardiologist and PCP    Person(s) Educated  Patient    Methods  Explanation    Comprehension  Verbalized understanding       Objectives  No latex band allergies   Pt states having dizzy spells about 3 years ago  MedbridgeAccess Code: [email or web address]   Gait: antalgic, decreased stance L LE, L femoral IR during L LE stance phase.  No pacemakers or defibrillators   Next Cardiologist appointment is in March 2020      Pt was recommended to see a doctor to get her checked out to make sure she did not hurt anything.     Blood pressure L arm sitting, mechanically  taken, normal cuff after 2 minutes of standing: 164/119, HR 72   Blood pressure L arm sitting, mechanically taken, normal cuff after 2 minutes of sitting: 158/100, HR 89 52 BPM pulse, manually taken  Manual therapy  Seated STM R vastus lateralis and lateral hamstrings to decreased tension  and promote proper mechanics to R knee when walking to help decrease pain.     Response to treatment Pt states R knee feels better when walking on it after the manual therapy to her muscles on her thigh.    Clinical impression Rest of PT held off for today secondary to current elevated diastolic levels and history of weakness and fall last week with addition of elevated diastolic levels last week as well same day of fall. Pt was recommended to make an appointment with her cardiologist to get her checked out as well as with her PCP (to also make sure she did not hurt anything during the fall/possible x-rays).     PT Short Term Goals - 07/07/18 1403      PT SHORT TERM GOAL #1   Title  Patient will be independent with her HEP to promote balance and decrease fall risk.     Baseline  Pt states performing her exercises at home (07/07/2018)    Time  3    Period  Weeks    Status  Achieved    Target Date  06/26/18        PT Long Term Goals - 09/11/18 1330      PT LONG TERM GOAL #1   Title  Patient will improve B LE strength by at least 1/2 MMT grade to promote ability to perform standing tasks with decreased fall risk.     Time  6    Period  Weeks    Status  Partially Met    Target Date  10/23/18      PT LONG TERM GOAL #2   Title  Patient will improve TUG time to 12 seconds or less with use of SPC to promote functional mobility and decrease fall risk.     Baseline  18.4 seconds on average with SPC on L (06/03/2018); 16.06 seconds average with SPC (07/07/2018); 15.67 seconds average with SPC (09/08/2018)    Time  6    Period  Weeks    Status  Partially Met    Target Date  10/23/18      PT  LONG TERM GOAL #3   Title  Patient will improve FOTO score by at least 10 points as a demonstration of improved function.     Baseline  Lower Leg FOTO 40 (06/03/2018); 41 (07/07/2018); 45 (09/11/2018)    Time  6    Period  Weeks    Status  Partially Met    Target Date  10/23/18      PT LONG TERM GOAL #4   Title  Patient will be able to ambulate 100 ft or more with SBA to independent to promote mobility.     Baseline  Pt currently ambulates with SPC (06/03/2018); pt able to ambulate 200 ft without using SPC CGA. LOB x 1 with independent recovery (07/07/2018). 200 ft without SPC, SBA, no LOB (09/11/2018)    Time  6    Period  Weeks    Status  Partially Met    Target Date  10/23/18            Plan - 10/14/18 1348    Clinical Impression Statement  Rest of PT held off for today secondary to current elevated diastolic levels and history of weakness and fall last week with addition of elevated diastolic levels last week as well same day of fall. Pt was recommended to make an appointment with her cardiologist to get her checked out as well as  with her PCP (to also make sure she did not hurt anything during the fall/possible x-rays).     Rehab Potential  Fair    Clinical Impairments Affecting Rehab Potential  (-) age, hx of falls, weakness, medical history; (+) motivated    PT Frequency  2x / week    PT Duration  6 weeks    PT Treatment/Interventions  Therapeutic activities;Therapeutic exercise;Neuromuscular re-education;Balance training;Patient/family education;Manual techniques;Clear needling;Aquatic Therapy;Gait training    PT Next Visit Plan  trunk, B hip and LE strengthening, balance, gait, manual techniques and modalities PRN.     Consulted and Agree with Plan of Care  Patient       Patient will benefit from skilled therapeutic intervention in order to improve the following deficits and impairments:  Postural dysfunction, Improper body mechanics, Difficulty walking, Decreased strength,  Decreased balance, Abnormal gait  Visit Diagnosis: Muscle weakness (generalized)  History of falling  Difficulty in walking, not elsewhere classified     Problem List Patient Active Problem List   Diagnosis Date Noted  . S/P ORIF (open reduction internal fixation) fracture radius and ulnar fx 04/08/18 04/10/2018  . Distal radius and ulna fracture, right   . History of CVA (cerebrovascular accident)   . Benign essential HTN   . Tachycardia   . Leukocytosis   . Seizures ([place])   . Stroke-like episode ([place]) s/p IV tPA 04/03/2018  . Syncope   . TIA (transient ischemic attack) 11/04/2017  . Malnutrition of moderate degree 08/26/2017  . Total knee replacement status 08/07/2017  . Closed left hip fracture ([place]) 02/12/2017  . HTN (hypertension) 02/12/2017  . HLD (hyperlipidemia) 02/12/2017     Joneen Boers PT, DPT  10/14/2018, 2:34 PM  [facility] [address], [place], [address] Phone: [phone]   Fax:  [phone]  Name: Kellie Harris MRN: [id number] Date of Birth: 07-24-1931

## 2018-10-15 DIAGNOSIS — I10 Essential (primary) hypertension: Secondary | ICD-10-CM | POA: Diagnosis not present

## 2018-10-15 DIAGNOSIS — R42 Dizziness and giddiness: Secondary | ICD-10-CM | POA: Diagnosis not present

## 2018-10-16 ENCOUNTER — Ambulatory Visit: Payer: PPO

## 2018-10-21 ENCOUNTER — Ambulatory Visit: Payer: PPO

## 2018-10-21 DIAGNOSIS — R262 Difficulty in walking, not elsewhere classified: Secondary | ICD-10-CM

## 2018-10-21 DIAGNOSIS — Z9181 History of falling: Secondary | ICD-10-CM

## 2018-10-21 DIAGNOSIS — M6281 Muscle weakness (generalized): Secondary | ICD-10-CM | POA: Diagnosis not present

## 2018-10-21 NOTE — Therapy (Signed)
[facility] [address] [place], [address] Phone: [phone]   Fax:  [phone]  Physical Therapy Treatment And Discharge Summary  Patient Details  Name: Kellie Harris MRN: [id number] Date of Birth: 07-Sep-1930 Referring Provider (PT): Antony Contras, MD   Encounter Date: 10/21/2018  PT End of Session - 10/21/18 1343    Visit Number  26    Number of Visits  47    Date for PT Re-Evaluation  10/23/18    Authorization Type  8    Authorization Time Period  of 10 progress report    PT Start Time  1345    PT Stop Time  1443    PT Time Calculation (min)  58 min    Equipment Utilized During Treatment  Gait belt    Activity Tolerance  Patient tolerated treatment well    Behavior During Therapy  WFL for tasks assessed/performed       Past Medical History:  Diagnosis Date  . Arthritis    right knee, left shoulder/ OSTEO  . Cancer ([place])    skin   . Degenerative disc disease, lumbar    L4-L5  . Diverticulosis    ITIS  . History of hiatal hernia   . Hydronephrosis    right  . Hypercholesteremia   . Hypertension    CONTROLLED ON MEDS  . IBS (irritable bowel syndrome)   . Knee fracture, right 03/05/2015   DIFFICULTY WITH AMBULATION  . Osteopenia   . Ovarian cyst   . Pancreatic insufficiency   . Wears hearing aid    bilateral    Past Surgical History:  Procedure Laterality Date  . CATARACT EXTRACTION W/PHACO Right 11/02/2015   Procedure: CATARACT EXTRACTION PHACO AND INTRAOCULAR LENS PLACEMENT (IOC);  Surgeon: Leandrew Koyanagi, MD;  Location: [facility];  Service: Ophthalmology;  Laterality: Right;  . CATARACT EXTRACTION W/PHACO Left 07/18/2016   Procedure: CATARACT EXTRACTION PHACO AND INTRAOCULAR LENS PLACEMENT (IOC);  Surgeon: Leandrew Koyanagi, MD;  Location: [facility];  Service: Ophthalmology;  Laterality: Left;  . COLONOSCOPY    . FRACTURE SURGERY Left    hip  . history  of left hip fracture    . INTRAMEDULLARY (IM) NAIL INTERTROCHANTERIC Left 02/12/2017   Procedure: INTRAMEDULLARY (IM) NAIL INTERTROCHANTRIC;  Surgeon: Earnestine Leys, MD;  Location: [facility];  Service: Orthopedics;  Laterality: Left;  . kidney stent    . OOPHORECTOMY Bilateral 2015  . ORIF WRIST FRACTURE Right 04/08/2018   Procedure: OPEN REDUCTION INTERNAL FIXATION (ORIF) WRIST FRACTURE;  Surgeon: Altamese Walker, MD;  Location: [facility];  Service: Orthopedics;  Laterality: Right;  . TONSILLECTOMY    . TOTAL KNEE ARTHROPLASTY Right 08/07/2017   Procedure: TOTAL KNEE ARTHROPLASTY;  Surgeon: Earnestine Leys, MD;  Location: [facility];  Service: Orthopedics;  Laterality: Right;    There were no vitals filed for this visit.  Subjective Assessment - 10/21/18 1350    Subjective  Pt states that Dr. Raylene Miyamoto office called and told her that her thyroid/parathyroid panel is elevate. They want her to see an endochrinologist.  Pt was upset about her levels.  Sees Dr. Hester Mates this coming Monday.  R knee still hurts. Feels like its going to give way since the most recent fall 2 weeks ago.   Pt states that Dr. Lenn Cal PA did not think she broke any bones from her fall.     Pertinent History  Pt states being referred to PT for balance  and gait. Gait and balance has been going dowhnill since December 2018 after her R TKA.  [place] [address] after being discharged from skilled nursing facility and fractured the head of her L femur. Did not have surgery due to the fracture not being bad. Had a sycope episode March 2019. She was out all of a sudden. Had a total stroke work up and was within normal limits.  Was coming along alright. However, had another syncopal episode 04/03/2018. Pt was standing and talking to her neighbor. Does not remember the ambulance coming. She seemed like she had a stroke. Had a second stroke work up and the results were within normal limits. Took her longer to recover from her syncopal episode.  Currently taking  plavix.  Currently taking the medication Kepra for seizzures. Pt states that when the episodes occur, there is no warning.   Had R wrist fracture ORIF on 04/08/2018.  Pt currently NWB R wrist.   Mainly uses her hurricain SPC.  Furniture walk at home. Also has a 3 wheeler and 4 wheeler rollator.  No falls other than due to her syncopal episodes. Pt states CPR can be performed if needed. Nothing long term. Can call ambulance if pt passes out or has a seizure. Neighbor was on her L side when talking to her prior to passing out.       Patient Stated Goals  Be able to walk better.    Currently in Pain?  Yes    Pain Score  6    R knee when going down 2 steps at home.    Pain Onset  More than a month ago         [facility] PT Assessment - 10/21/18 1419      Strength   Right Hip Flexion  4+/5    Right Hip Extension  5/5   seated manually resisted   Right Hip ABduction  5/5   seated manually resisted clamshell   Left Hip Flexion  4+/5    Left Hip Extension  5/5   seated manually resisted   Left Hip ABduction  5/5   seated manually resisted clamshell   Right Knee Flexion  5/5    Right Knee Extension  5/5    Left Knee Flexion  5/5    Left Knee Extension  5/5                           Objectives  No latex band allergies   Pt states having dizzy spells about 3 years ago  Pt states having a small rollator walker at home that she has that she can use to sit on if she is going to have a faint spell. Pt. States wanting to continue with her HEP at home to continue progress.      MedbridgeAccess Code: [email or web address]    Manual therapy  Seated STM R vastus lateralis and lateral hamstrings to decrease tension and promote proper mechanics to R knee when walking to help decrease pain.   Knee feels better afterwards per pt     Ther-ex  Blood pressure L arm sitting, mechanically taken, normal cuff: 155/76, HR 72  Standing up from a chair, walking 10 ft forward,  then returning 10 ft, then sitting back onto chair 3x  Without AD: 21 seconds, 16 seconds, 15 seconds, 13 seconds   14.67 seconds average for last 3 trials without AD Reviewed progress with PT.   Gait around gym with  pt holding SPC 200 ft. CGA secondary to recent hx of fall  Seated R hip ER 10x5 seconds for 2 sets   Seated manually resisted hip flexion, knee flexion, knee extension, hip extension, clamshell 1x each way for each LE  Reviewed progress with strength with pt.   Ascending and descending 4 regular steps with L rail assist. Reciprocal pattern.  1x with CGA to min A going down.    Improved exercise technique, movement at target joints, use of target muscles after min to mod verbal, visual, tactile cues.    Response to treatment Pt tolerated session well without aggravation of symptoms or R knee pain. Good muscle use felt with exercises.    Clinical impression Pt demonstrates overall improved B LE strength, balance, ability to ambulate without AD, as well as improved function since initial evaluation. Pt currently ambulating with her 3 wheeled rollator walker secondary to recent fall 2 weeks ago in which both her legs felt week. Pt feels like she is ready to continue her progress with her HEP at home and that she plans to use her small rollator walker with a seat for those times when she feels like she is going to have a syncopal or weakness spell. Skilled physical therapy services discharged secondary to progress and pt input with patient continuing progress with her exercises at home.       PT Education - 10/21/18 1459    Education Details  ther-ex, progress/current status with PT towards goals    Person(s) Educated  Patient    Methods  Explanation;Demonstration;Tactile cues;Verbal cues    Comprehension  Returned demonstration;Verbalized understanding          PT Short Term Goals - 07/07/18 1403      PT SHORT TERM GOAL #1   Title  Patient will be  independent with her HEP to promote balance and decrease fall risk.     Baseline  Pt states performing her exercises at home (07/07/2018)    Time  3    Period  Weeks    Status  Achieved    Target Date  06/26/18        PT Long Term Goals - 10/21/18 1505      PT LONG TERM GOAL #1   Title  Patient will improve B LE strength by at least 1/2 MMT grade to promote ability to perform standing tasks with decreased fall risk.     Time  6    Period  Weeks    Status  Achieved    Target Date  10/23/18      PT LONG TERM GOAL #2   Title  Patient will improve TUG time to 12 seconds or less with use of SPC to promote functional mobility and decrease fall risk.     Baseline  18.4 seconds on average with SPC on L (06/03/2018); 16.06 seconds average with SPC (07/07/2018); 15.67 seconds average with SPC (09/08/2018); 14.67 seconds average without AD but with CGA (10/21/2018)    Time  6    Period  Weeks    Status  Partially Met    Target Date  10/23/18      PT LONG TERM GOAL #3   Title  Patient will improve FOTO score by at least 10 points as a demonstration of improved function.     Baseline  Lower Leg FOTO 40 (06/03/2018); 41 (07/07/2018); 45 (09/11/2018)    Time  6    Period  Weeks    Status  Partially Met    Target Date  10/23/18      PT LONG TERM GOAL #4   Title  Patient will be able to ambulate 100 ft or more with SBA to independent to promote mobility.     Baseline  Pt currently ambulates with SPC (06/03/2018); pt able to ambulate 200 ft without using SPC CGA. LOB x 1 with independent recovery (07/07/2018). 200 ft without SPC, SBA, no LOB (09/11/2018); 200 ft with CGA, no AD, no LOB (10/21/2018)    Time  6    Period  Weeks    Status  Partially Met    Target Date  10/23/18            Plan - 10/21/18 1342    Clinical Impression Statement  Pt demonstrates overall improved B LE strength, balance, ability to ambulate without AD, as well as improved function since initial evaluation. Pt  currently ambulating with her 3 wheeled rollator walker secondary to recent fall 2 weeks ago in which both her legs felt week. Pt feels like she is ready to continue her progress with her HEP at home and that she plans to use her small rollator walker with a seat for those times when she feels like she is going to have a syncopal or weakness spell. Skilled physical therapy services discharged secondary to progress and pt input with patient continuing progress with her exercises at home.     Stability/Clinical Decision Making  Stable/Uncomplicated    Clinical Decision Making  Low    Clinical Presentation due to:  improved strength, TUG    Rehab Potential  Fair    Clinical Impairments Affecting Rehab Potential  (-) age, hx of falls, weakness, medical history; (+) motivated    PT Frequency  --    PT Duration  --    PT Treatment/Interventions  Therapeutic activities;Therapeutic exercise;Neuromuscular re-education;Balance training;Patient/family education;Manual techniques;Gait training    PT Next Visit Plan  continue progress with her exercises at home    Consulted and Agree with Plan of Care  Patient       Patient will benefit from skilled therapeutic intervention in order to improve the following deficits and impairments:  Postural dysfunction, Improper body mechanics, Difficulty walking, Decreased strength, Decreased balance, Abnormal gait  Visit Diagnosis: Muscle weakness (generalized)  History of falling  Difficulty in walking, not elsewhere classified     Problem List Patient Active Problem List   Diagnosis Date Noted  . S/P ORIF (open reduction internal fixation) fracture radius and ulnar fx 04/08/18 04/10/2018  . Distal radius and ulna fracture, right   . History of CVA (cerebrovascular accident)   . Benign essential HTN   . Tachycardia   . Leukocytosis   . Seizures ([place])   . Stroke-like episode ([place]) s/p IV tPA 04/03/2018  . Syncope   . TIA (transient ischemic attack)  11/04/2017  . Malnutrition of moderate degree 08/26/2017  . Total knee replacement status 08/07/2017  . Closed left hip fracture ([place]) 02/12/2017  . HTN (hypertension) 02/12/2017  . HLD (hyperlipidemia) 02/12/2017   Thank you for your referral.  Joneen Boers PT, DPT   10/21/2018, 3:10 PM  [facility] [address], [place], [address] Phone: [phone]   Fax:  [phone]  Name: Kellie Harris MRN: [id number] Date of Birth: [birth year]

## 2018-10-29 ENCOUNTER — Ambulatory Visit: Payer: PPO

## 2018-11-05 ENCOUNTER — Ambulatory Visit: Payer: PPO

## 2019-01-06 DIAGNOSIS — H04123 Dry eye syndrome of bilateral lacrimal glands: Secondary | ICD-10-CM | POA: Diagnosis not present

## 2019-01-08 DIAGNOSIS — M810 Age-related osteoporosis without current pathological fracture: Secondary | ICD-10-CM | POA: Diagnosis not present

## 2019-01-08 DIAGNOSIS — E213 Hyperparathyroidism, unspecified: Secondary | ICD-10-CM | POA: Diagnosis not present

## 2019-01-28 DIAGNOSIS — E213 Hyperparathyroidism, unspecified: Secondary | ICD-10-CM | POA: Diagnosis not present

## 2019-01-28 DIAGNOSIS — M810 Age-related osteoporosis without current pathological fracture: Secondary | ICD-10-CM | POA: Diagnosis not present

## 2019-02-10 DIAGNOSIS — M810 Age-related osteoporosis without current pathological fracture: Secondary | ICD-10-CM | POA: Diagnosis not present

## 2019-02-10 DIAGNOSIS — E213 Hyperparathyroidism, unspecified: Secondary | ICD-10-CM | POA: Diagnosis not present

## 2019-02-20 DIAGNOSIS — E213 Hyperparathyroidism, unspecified: Secondary | ICD-10-CM | POA: Diagnosis not present

## 2019-02-24 DIAGNOSIS — Z79899 Other long term (current) drug therapy: Secondary | ICD-10-CM | POA: Diagnosis not present

## 2019-02-24 DIAGNOSIS — S72009A Fracture of unspecified part of neck of unspecified femur, initial encounter for closed fracture: Secondary | ICD-10-CM | POA: Diagnosis not present

## 2019-02-24 DIAGNOSIS — S6292XA Unspecified fracture of left wrist and hand, initial encounter for closed fracture: Secondary | ICD-10-CM | POA: Diagnosis not present

## 2019-02-24 DIAGNOSIS — R410 Disorientation, unspecified: Secondary | ICD-10-CM | POA: Diagnosis not present

## 2019-02-24 DIAGNOSIS — E210 Primary hyperparathyroidism: Secondary | ICD-10-CM | POA: Diagnosis not present

## 2019-02-24 DIAGNOSIS — S82009A Unspecified fracture of unspecified patella, initial encounter for closed fracture: Secondary | ICD-10-CM | POA: Diagnosis not present

## 2019-02-24 DIAGNOSIS — M810 Age-related osteoporosis without current pathological fracture: Secondary | ICD-10-CM | POA: Diagnosis not present

## 2019-02-24 DIAGNOSIS — Z7982 Long term (current) use of aspirin: Secondary | ICD-10-CM | POA: Diagnosis not present

## 2019-02-24 DIAGNOSIS — E213 Hyperparathyroidism, unspecified: Secondary | ICD-10-CM | POA: Diagnosis not present

## 2019-02-24 DIAGNOSIS — Z8673 Personal history of transient ischemic attack (TIA), and cerebral infarction without residual deficits: Secondary | ICD-10-CM | POA: Diagnosis not present

## 2019-02-24 DIAGNOSIS — M6281 Muscle weakness (generalized): Secondary | ICD-10-CM | POA: Diagnosis not present

## 2019-02-24 DIAGNOSIS — S6291XA Unspecified fracture of right wrist and hand, initial encounter for closed fracture: Secondary | ICD-10-CM | POA: Diagnosis not present

## 2019-02-24 DIAGNOSIS — I10 Essential (primary) hypertension: Secondary | ICD-10-CM | POA: Diagnosis not present

## 2019-02-24 DIAGNOSIS — E785 Hyperlipidemia, unspecified: Secondary | ICD-10-CM | POA: Diagnosis not present

## 2019-03-17 DIAGNOSIS — E213 Hyperparathyroidism, unspecified: Secondary | ICD-10-CM | POA: Diagnosis not present

## 2019-03-17 DIAGNOSIS — D351 Benign neoplasm of parathyroid gland: Secondary | ICD-10-CM | POA: Diagnosis not present

## 2019-03-24 DIAGNOSIS — Z01818 Encounter for other preprocedural examination: Secondary | ICD-10-CM | POA: Diagnosis not present

## 2019-03-24 DIAGNOSIS — Z8673 Personal history of transient ischemic attack (TIA), and cerebral infarction without residual deficits: Secondary | ICD-10-CM | POA: Diagnosis not present

## 2019-03-24 DIAGNOSIS — I10 Essential (primary) hypertension: Secondary | ICD-10-CM | POA: Diagnosis not present

## 2019-03-24 DIAGNOSIS — E785 Hyperlipidemia, unspecified: Secondary | ICD-10-CM | POA: Diagnosis not present

## 2019-03-24 DIAGNOSIS — R569 Unspecified convulsions: Secondary | ICD-10-CM | POA: Diagnosis not present

## 2019-03-25 DIAGNOSIS — Z1159 Encounter for screening for other viral diseases: Secondary | ICD-10-CM | POA: Diagnosis not present

## 2019-03-27 DIAGNOSIS — Z8673 Personal history of transient ischemic attack (TIA), and cerebral infarction without residual deficits: Secondary | ICD-10-CM | POA: Diagnosis not present

## 2019-03-27 DIAGNOSIS — E213 Hyperparathyroidism, unspecified: Secondary | ICD-10-CM | POA: Diagnosis not present

## 2019-03-27 DIAGNOSIS — D351 Benign neoplasm of parathyroid gland: Secondary | ICD-10-CM | POA: Diagnosis not present

## 2019-03-27 DIAGNOSIS — E210 Primary hyperparathyroidism: Secondary | ICD-10-CM | POA: Diagnosis not present

## 2019-03-27 DIAGNOSIS — R590 Localized enlarged lymph nodes: Secondary | ICD-10-CM | POA: Diagnosis not present

## 2019-03-27 DIAGNOSIS — I10 Essential (primary) hypertension: Secondary | ICD-10-CM | POA: Diagnosis not present

## 2019-03-27 DIAGNOSIS — E785 Hyperlipidemia, unspecified: Secondary | ICD-10-CM | POA: Diagnosis not present

## 2019-03-27 DIAGNOSIS — Z9641 Presence of insulin pump (external) (internal): Secondary | ICD-10-CM | POA: Diagnosis not present

## 2019-03-27 DIAGNOSIS — Z95810 Presence of automatic (implantable) cardiac defibrillator: Secondary | ICD-10-CM | POA: Diagnosis not present

## 2019-03-27 DIAGNOSIS — Z7902 Long term (current) use of antithrombotics/antiplatelets: Secondary | ICD-10-CM | POA: Diagnosis not present

## 2019-04-07 DIAGNOSIS — E213 Hyperparathyroidism, unspecified: Secondary | ICD-10-CM | POA: Diagnosis not present

## 2019-04-07 DIAGNOSIS — Z09 Encounter for follow-up examination after completed treatment for conditions other than malignant neoplasm: Secondary | ICD-10-CM | POA: Diagnosis not present

## 2019-04-13 DIAGNOSIS — I10 Essential (primary) hypertension: Secondary | ICD-10-CM | POA: Diagnosis not present

## 2019-04-13 DIAGNOSIS — R531 Weakness: Secondary | ICD-10-CM | POA: Diagnosis not present

## 2019-04-13 DIAGNOSIS — R5383 Other fatigue: Secondary | ICD-10-CM | POA: Diagnosis not present

## 2019-04-23 DIAGNOSIS — E871 Hypo-osmolality and hyponatremia: Secondary | ICD-10-CM | POA: Diagnosis not present

## 2019-04-23 DIAGNOSIS — Z23 Encounter for immunization: Secondary | ICD-10-CM | POA: Diagnosis not present

## 2019-04-23 DIAGNOSIS — M810 Age-related osteoporosis without current pathological fracture: Secondary | ICD-10-CM | POA: Diagnosis not present

## 2019-04-27 DIAGNOSIS — E871 Hypo-osmolality and hyponatremia: Secondary | ICD-10-CM | POA: Diagnosis not present

## 2019-05-03 IMAGING — DX DG KNEE 1-2V PORT*R*
2 series · 3 of 3 positions shown · non-contrast
Comparison: March 04, 2015

CLINICAL DATA: Status post total knee replacement

EXAM:
PORTABLE RIGHT KNEE - 1-2 VIEW

[knee ap]
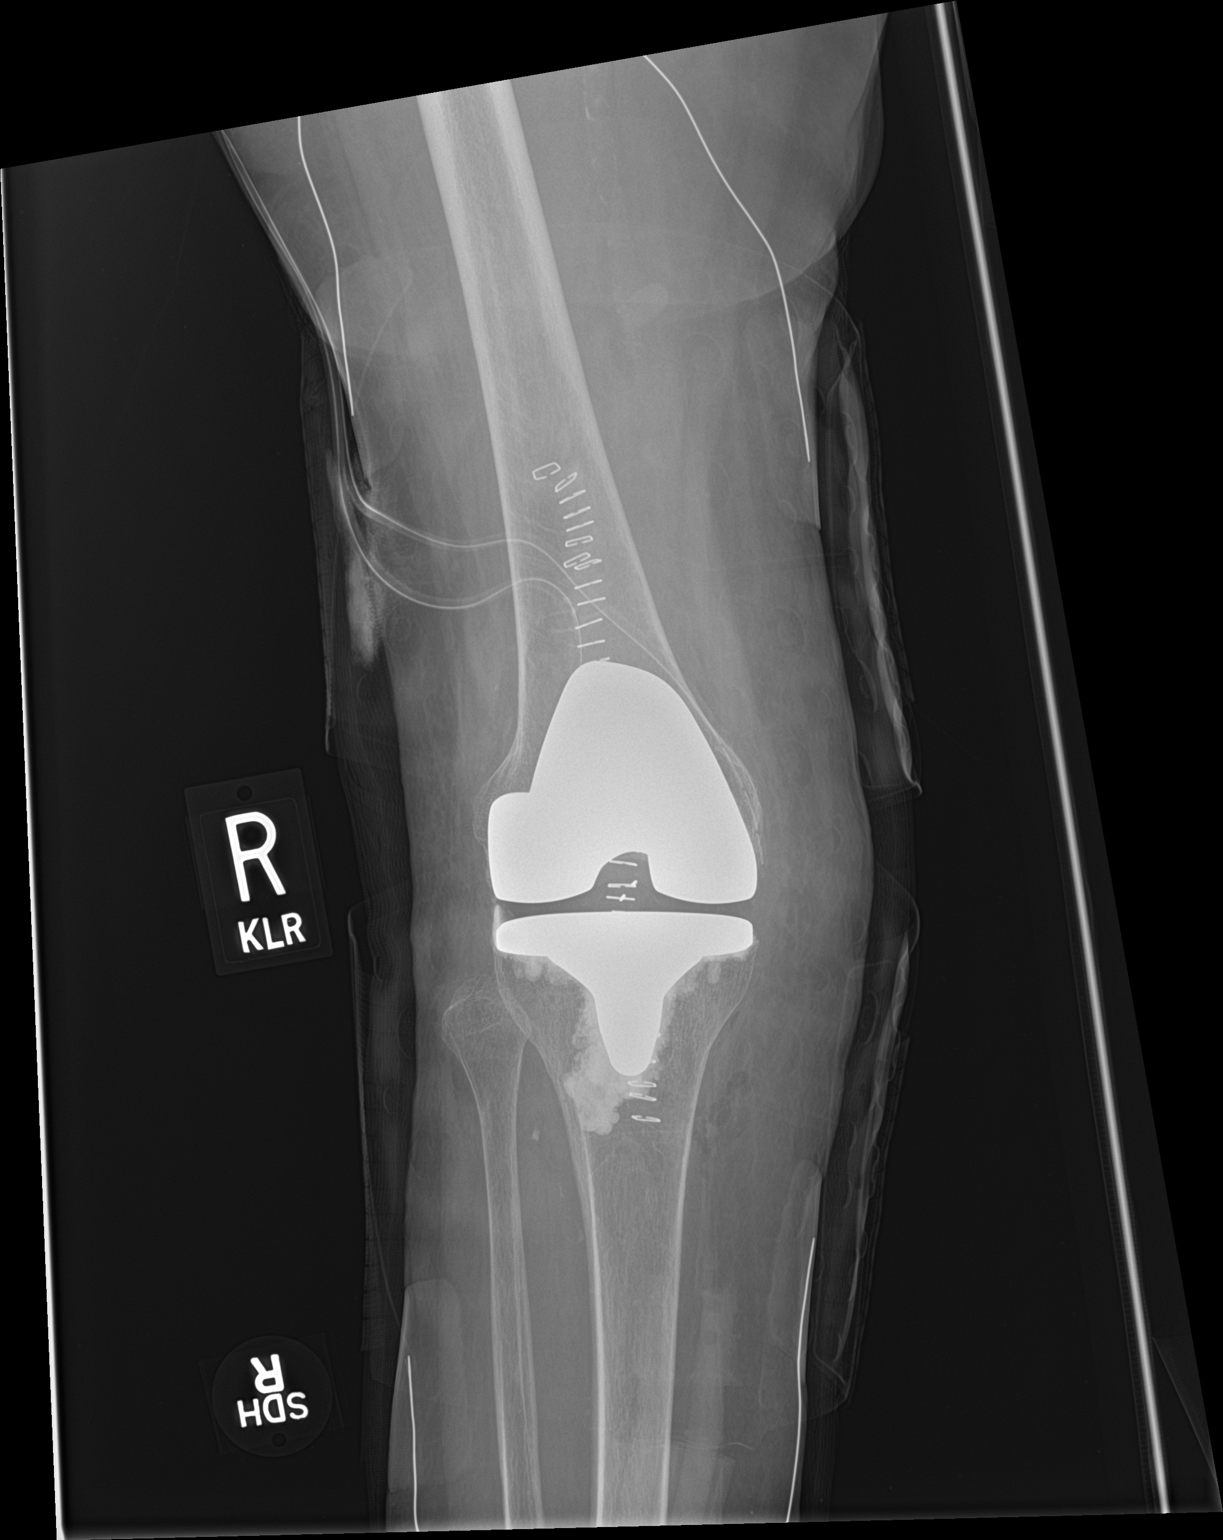

[Series 2: knee lat · 0.14mm/px · 2 of 2 slices shown]
[im 1/2]
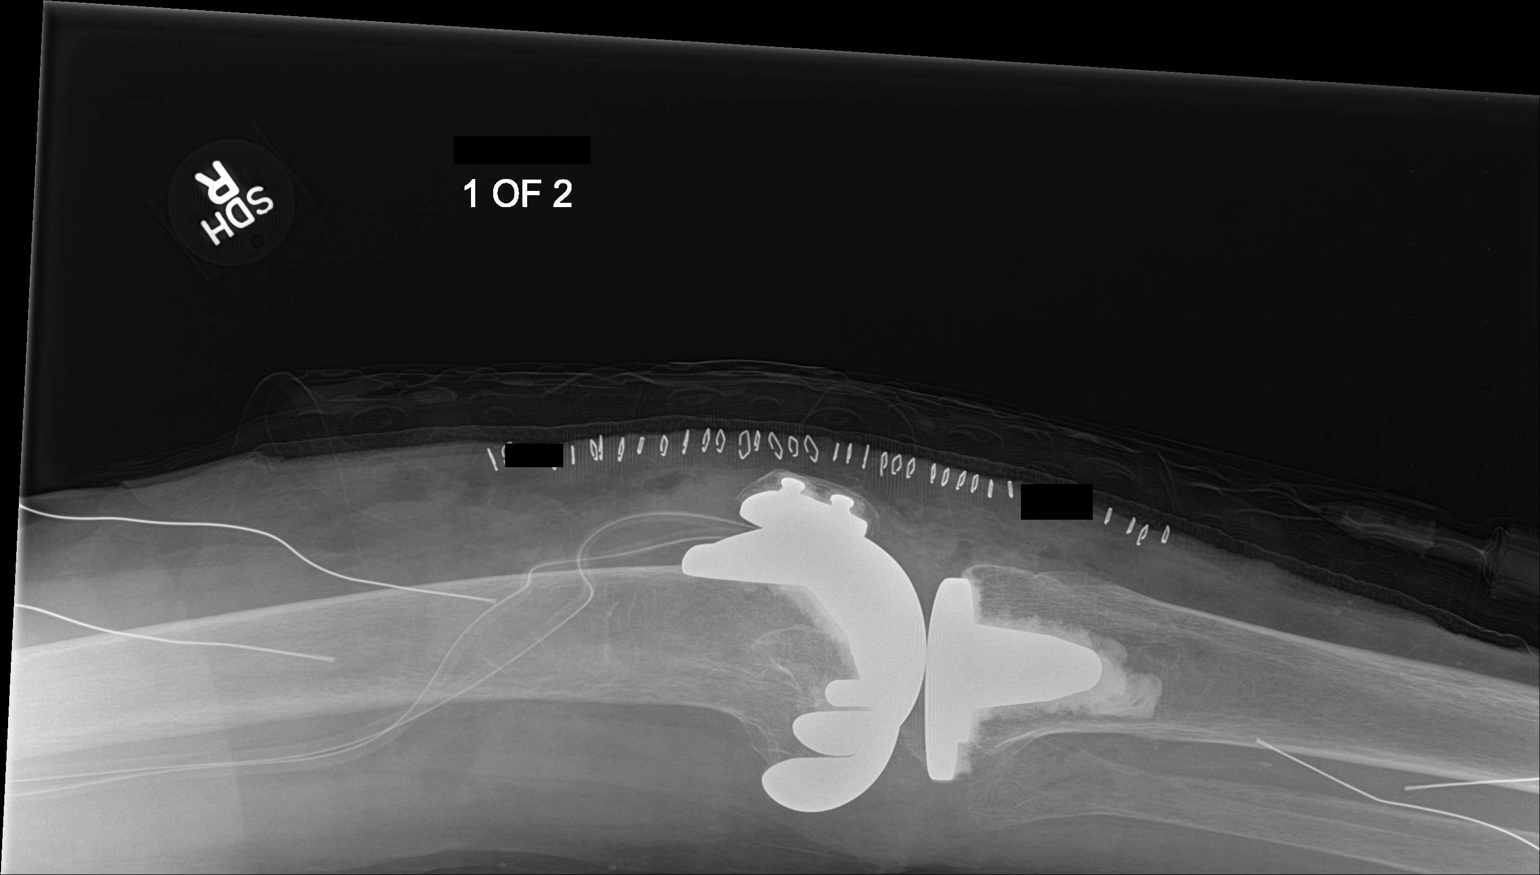
[im 2/2]
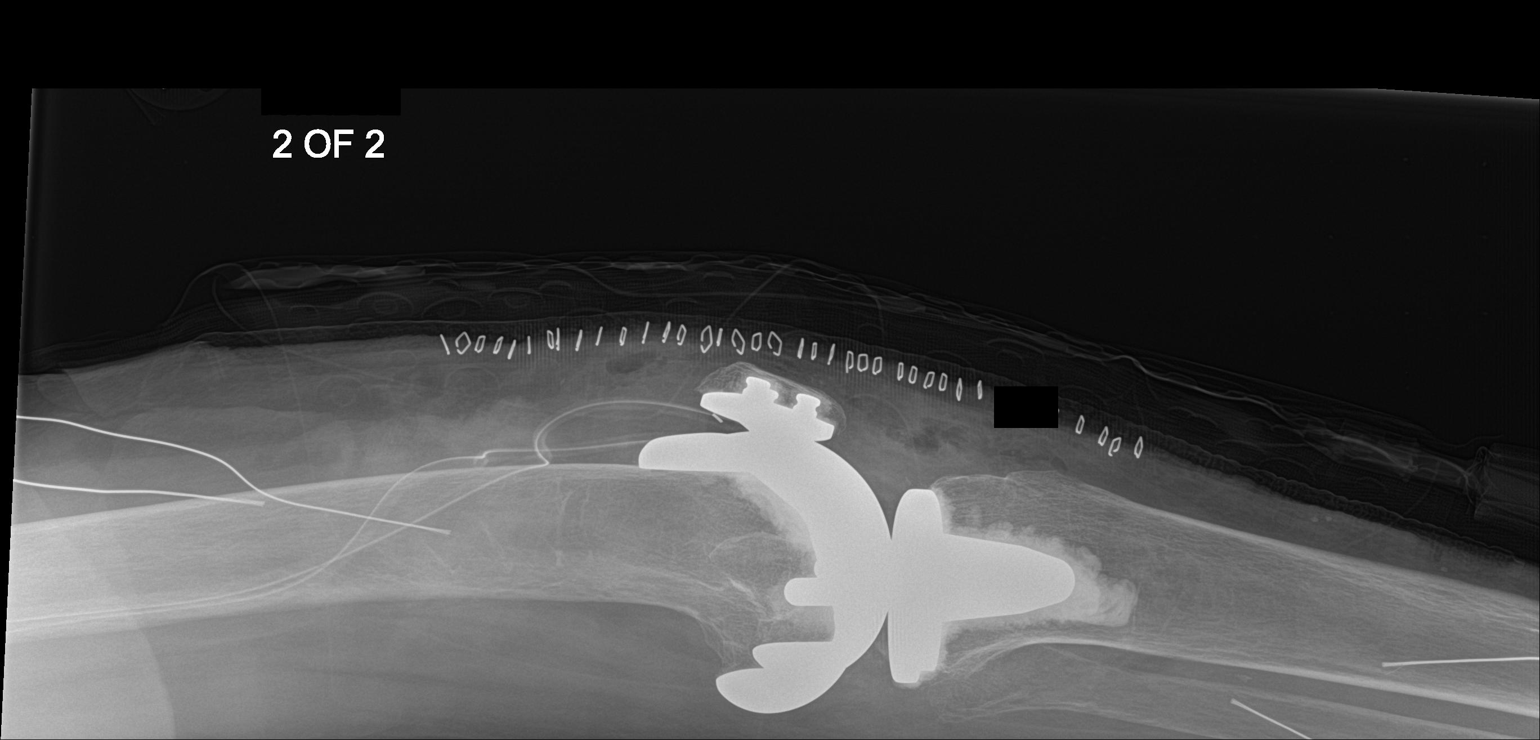

[3 of 3 positions shown; findings below may reference images not displayed]

FINDINGS: Frontal and lateral views were obtained. Patient is status post
total knee replacement with femoral and tibial prosthetic components
well-seated. No fracture or dislocation. No erosive change. There is
a drain in the suprapatellar bursa. Soft tissue fluid and air noted.
IMPRESSION: Femoral and tibial prosthetic components well-seated. No acute
fracture or dislocation.

## 2019-05-20 DIAGNOSIS — M25519 Pain in unspecified shoulder: Secondary | ICD-10-CM | POA: Diagnosis not present

## 2019-05-20 DIAGNOSIS — M25552 Pain in left hip: Secondary | ICD-10-CM | POA: Diagnosis not present

## 2019-05-20 DIAGNOSIS — R42 Dizziness and giddiness: Secondary | ICD-10-CM | POA: Diagnosis not present

## 2019-05-20 DIAGNOSIS — M25551 Pain in right hip: Secondary | ICD-10-CM | POA: Diagnosis not present

## 2019-06-16 DIAGNOSIS — Z0000 Encounter for general adult medical examination without abnormal findings: Secondary | ICD-10-CM | POA: Diagnosis not present

## 2019-06-16 DIAGNOSIS — I10 Essential (primary) hypertension: Secondary | ICD-10-CM | POA: Diagnosis not present

## 2019-06-16 DIAGNOSIS — E7800 Pure hypercholesterolemia, unspecified: Secondary | ICD-10-CM | POA: Diagnosis not present

## 2019-06-16 DIAGNOSIS — E041 Nontoxic single thyroid nodule: Secondary | ICD-10-CM | POA: Diagnosis not present

## 2019-06-16 DIAGNOSIS — E559 Vitamin D deficiency, unspecified: Secondary | ICD-10-CM | POA: Diagnosis not present

## 2019-06-16 DIAGNOSIS — E213 Hyperparathyroidism, unspecified: Secondary | ICD-10-CM | POA: Diagnosis not present

## 2019-06-16 DIAGNOSIS — E871 Hypo-osmolality and hyponatremia: Secondary | ICD-10-CM | POA: Diagnosis not present

## 2019-06-16 DIAGNOSIS — M810 Age-related osteoporosis without current pathological fracture: Secondary | ICD-10-CM | POA: Diagnosis not present

## 2019-08-24 DIAGNOSIS — R42 Dizziness and giddiness: Secondary | ICD-10-CM | POA: Diagnosis not present

## 2019-08-24 DIAGNOSIS — M25519 Pain in unspecified shoulder: Secondary | ICD-10-CM | POA: Diagnosis not present

## 2019-08-24 DIAGNOSIS — M25552 Pain in left hip: Secondary | ICD-10-CM | POA: Diagnosis not present

## 2019-08-24 DIAGNOSIS — M25551 Pain in right hip: Secondary | ICD-10-CM | POA: Diagnosis not present

## 2019-08-24 DIAGNOSIS — G459 Transient cerebral ischemic attack, unspecified: Secondary | ICD-10-CM | POA: Diagnosis not present

## 2019-11-30 DIAGNOSIS — M19012 Primary osteoarthritis, left shoulder: Secondary | ICD-10-CM | POA: Diagnosis not present

## 2019-11-30 DIAGNOSIS — S72035D Nondisplaced midcervical fracture of left femur, subsequent encounter for closed fracture with routine healing: Secondary | ICD-10-CM | POA: Diagnosis not present

## 2019-11-30 DIAGNOSIS — Z96651 Presence of right artificial knee joint: Secondary | ICD-10-CM | POA: Diagnosis not present

## 2019-11-30 DIAGNOSIS — M19011 Primary osteoarthritis, right shoulder: Secondary | ICD-10-CM | POA: Diagnosis not present

## 2019-12-16 DIAGNOSIS — E7800 Pure hypercholesterolemia, unspecified: Secondary | ICD-10-CM | POA: Diagnosis not present

## 2019-12-16 DIAGNOSIS — Z8673 Personal history of transient ischemic attack (TIA), and cerebral infarction without residual deficits: Secondary | ICD-10-CM | POA: Diagnosis not present

## 2019-12-16 DIAGNOSIS — I10 Essential (primary) hypertension: Secondary | ICD-10-CM | POA: Diagnosis not present

## 2019-12-16 DIAGNOSIS — E871 Hypo-osmolality and hyponatremia: Secondary | ICD-10-CM | POA: Diagnosis not present

## 2019-12-23 DIAGNOSIS — I10 Essential (primary) hypertension: Secondary | ICD-10-CM | POA: Diagnosis not present

## 2019-12-23 DIAGNOSIS — E213 Hyperparathyroidism, unspecified: Secondary | ICD-10-CM | POA: Diagnosis not present

## 2019-12-23 DIAGNOSIS — E871 Hypo-osmolality and hyponatremia: Secondary | ICD-10-CM | POA: Diagnosis not present

## 2019-12-23 DIAGNOSIS — D72829 Elevated white blood cell count, unspecified: Secondary | ICD-10-CM | POA: Diagnosis not present

## 2019-12-23 DIAGNOSIS — E782 Mixed hyperlipidemia: Secondary | ICD-10-CM | POA: Diagnosis not present

## 2019-12-23 DIAGNOSIS — Z0000 Encounter for general adult medical examination without abnormal findings: Secondary | ICD-10-CM | POA: Diagnosis not present

## 2020-05-03 ENCOUNTER — Emergency Department: Payer: PPO

## 2020-05-03 ENCOUNTER — Observation Stay: Payer: PPO

## 2020-05-03 ENCOUNTER — Observation Stay
Admission: EM | Admit: 2020-05-03 | Discharge: 2020-05-04 | Disposition: A | Discharge status: Home / Self Care | Payer: PPO | Attending: Internal Medicine | Admitting: Internal Medicine

## 2020-05-03 ENCOUNTER — Other Ambulatory Visit: Payer: Self-pay

## 2020-05-03 DIAGNOSIS — Z7901 Long term (current) use of anticoagulants: Secondary | ICD-10-CM | POA: Insufficient documentation

## 2020-05-03 DIAGNOSIS — R000 Tachycardia, unspecified: Secondary | ICD-10-CM | POA: Diagnosis not present

## 2020-05-03 DIAGNOSIS — R29818 Other symptoms and signs involving the nervous system: Secondary | ICD-10-CM | POA: Diagnosis not present

## 2020-05-03 DIAGNOSIS — J320 Chronic maxillary sinusitis: Secondary | ICD-10-CM | POA: Diagnosis not present

## 2020-05-03 DIAGNOSIS — I10 Essential (primary) hypertension: Secondary | ICD-10-CM | POA: Diagnosis not present

## 2020-05-03 DIAGNOSIS — E041 Nontoxic single thyroid nodule: Secondary | ICD-10-CM | POA: Diagnosis not present

## 2020-05-03 DIAGNOSIS — E213 Hyperparathyroidism, unspecified: Secondary | ICD-10-CM | POA: Diagnosis not present

## 2020-05-03 DIAGNOSIS — R4701 Aphasia: Secondary | ICD-10-CM | POA: Diagnosis not present

## 2020-05-03 DIAGNOSIS — R4781 Slurred speech: Secondary | ICD-10-CM | POA: Diagnosis not present

## 2020-05-03 DIAGNOSIS — Z79899 Other long term (current) drug therapy: Secondary | ICD-10-CM | POA: Insufficient documentation

## 2020-05-03 DIAGNOSIS — M810 Age-related osteoporosis without current pathological fracture: Secondary | ICD-10-CM | POA: Diagnosis not present

## 2020-05-03 DIAGNOSIS — G319 Degenerative disease of nervous system, unspecified: Secondary | ICD-10-CM | POA: Diagnosis not present

## 2020-05-03 DIAGNOSIS — H538 Other visual disturbances: Secondary | ICD-10-CM | POA: Diagnosis not present

## 2020-05-03 DIAGNOSIS — E785 Hyperlipidemia, unspecified: Secondary | ICD-10-CM | POA: Diagnosis present

## 2020-05-03 DIAGNOSIS — I6381 Other cerebral infarction due to occlusion or stenosis of small artery: Secondary | ICD-10-CM | POA: Diagnosis not present

## 2020-05-03 DIAGNOSIS — Z20822 Contact with and (suspected) exposure to covid-19: Secondary | ICD-10-CM | POA: Diagnosis not present

## 2020-05-03 DIAGNOSIS — R001 Bradycardia, unspecified: Secondary | ICD-10-CM | POA: Diagnosis not present

## 2020-05-03 DIAGNOSIS — E559 Vitamin D deficiency, unspecified: Secondary | ICD-10-CM | POA: Diagnosis not present

## 2020-05-03 DIAGNOSIS — Z96651 Presence of right artificial knee joint: Secondary | ICD-10-CM | POA: Insufficient documentation

## 2020-05-03 DIAGNOSIS — I63233 Cerebral infarction due to unspecified occlusion or stenosis of bilateral carotid arteries: Secondary | ICD-10-CM | POA: Diagnosis not present

## 2020-05-03 DIAGNOSIS — R0902 Hypoxemia: Secondary | ICD-10-CM | POA: Diagnosis not present

## 2020-05-03 DIAGNOSIS — Z8673 Personal history of transient ischemic attack (TIA), and cerebral infarction without residual deficits: Secondary | ICD-10-CM

## 2020-05-03 LAB — COMPREHENSIVE METABOLIC PANEL
ALT: 13 U/L (ref 0–44)
AST: 21 U/L (ref 15–41)
Albumin: 5.2 g/dL — ABNORMAL HIGH (ref 3.5–5.0)
Alkaline Phosphatase: 67 U/L (ref 38–126)
Anion gap: 17 — ABNORMAL HIGH (ref 5–15)
BUN: 11 mg/dL (ref 8–23)
CO2: 23 mmol/L (ref 22–32)
Calcium: 10.6 mg/dL — ABNORMAL HIGH (ref 8.9–10.3)
Chloride: 98 mmol/L (ref 98–111)
Creatinine, Ser: 0.8 mg/dL (ref 0.44–1.00)
GFR calc Af Amer: >60 mL/min (ref >60)
GFR calc non Af Amer: >60 mL/min (ref >60)
Glucose, Bld: 116 mg/dL — ABNORMAL HIGH (ref 70–99)
Potassium: 3.8 mmol/L (ref 3.5–5.1)
Sodium: 138 mmol/L (ref 135–145)
Total Bilirubin: 1.2 mg/dL (ref 0.3–1.2)
Total Protein: 8 g/dL (ref 6.5–8.1)

## 2020-05-03 LAB — CBC
HCT: 43.7 % (ref 36.0–46.0)
Hemoglobin: 14.7 g/dL (ref 12.0–15.0)
MCH: 29.6 pg (ref 26.0–34.0)
MCHC: 33.6 g/dL (ref 30.0–36.0)
MCV: 88.1 fL (ref 80.0–100.0)
Platelets: 291 10*3/uL (ref 150–400)
RBC: 4.96 MIL/uL (ref 3.87–5.11)
RDW: 14.5 % (ref 11.5–15.5)
WBC: 12.2 10*3/uL — ABNORMAL HIGH (ref 4.0–10.5)
nRBC: 0 % (ref 0.0–0.2)

## 2020-05-03 LAB — DIFFERENTIAL
Abs Immature Granulocytes: 0.03 10*3/uL (ref 0.00–0.07)
Basophils Absolute: 0.1 10*3/uL (ref 0.0–0.1)
Basophils Relative: 0 %
Eosinophils Absolute: 0.1 10*3/uL (ref 0.0–0.5)
Eosinophils Relative: 1 %
Immature Granulocytes: 0 %
Lymphocytes Relative: 11 %
Lymphs Abs: 1.4 10*3/uL (ref 0.7–4.0)
Monocytes Absolute: 0.6 10*3/uL (ref 0.1–1.0)
Monocytes Relative: 5 %
Neutro Abs: 10 10*3/uL — ABNORMAL HIGH (ref 1.7–7.7)
Neutrophils Relative %: 83 %

## 2020-05-03 LAB — APTT: aPTT: 32 seconds (ref 24–36)

## 2020-05-03 LAB — PROTIME-INR
INR: 0.9 (ref 0.8–1.2)
Prothrombin Time: 12.1 seconds (ref 11.4–15.2)

## 2020-05-03 LAB — GLUCOSE, CAPILLARY: Glucose-Capillary: 120 mg/dL — ABNORMAL HIGH (ref 70–99)

## 2020-05-03 LAB — SARS CORONAVIRUS 2 BY RT PCR (HOSPITAL ORDER, PERFORMED IN ~~LOC~~ HOSPITAL LAB): SARS Coronavirus 2: NEGATIVE

## 2020-05-03 LAB — ETHANOL: Alcohol, Ethyl (B): <10 mg/dL (ref <10)

## 2020-05-03 MED ORDER — SENNOSIDES-DOCUSATE SODIUM 8.6-50 MG PO TABS: 1.0000 | ORAL_TABLET | Freq: Every evening | ORAL | Status: DC | PRN

## 2020-05-03 MED ORDER — SODIUM CHLORIDE 0.9 % IV SOLN
50.0000 mg | Freq: Two times a day (BID) | INTRAVENOUS | Status: DC
  Administered 2020-05-03 – 2020-05-04 (×2): 50 mg via INTRAVENOUS
  Filled 2020-05-03 (×4): qty 5

## 2020-05-03 MED ORDER — SODIUM CHLORIDE 0.9 % IV SOLN
50.0000 mg | INTRAVENOUS | Status: DC
  Filled 2020-05-03: qty 5

## 2020-05-03 MED ORDER — SODIUM CHLORIDE 0.9 % IV SOLN: INTRAVENOUS | Status: AC

## 2020-05-03 MED ORDER — PRAVASTATIN SODIUM 20 MG PO TABS: 20.0000 mg | ORAL_TABLET | Freq: Every day | ORAL | Status: DC

## 2020-05-03 MED ORDER — ACETAMINOPHEN 160 MG/5ML PO SOLN
650.0000 mg | ORAL | Status: DC | PRN
  Filled 2020-05-03: qty 20.3

## 2020-05-03 MED ORDER — LABETALOL HCL 5 MG/ML IV SOLN
5.0000 mg | INTRAVENOUS | Status: DC | PRN
  Filled 2020-05-03: qty 4

## 2020-05-03 MED ORDER — ASPIRIN 81 MG PO CHEW
324.0000 mg | CHEWABLE_TABLET | Freq: Once | ORAL | Status: AC
  Administered 2020-05-04: 324 mg via ORAL
  Filled 2020-05-03: qty 4

## 2020-05-03 MED ORDER — ACETAMINOPHEN 650 MG RE SUPP: 650.0000 mg | RECTAL | Status: DC | PRN

## 2020-05-03 MED ORDER — ALTEPLASE 100 MG IV SOLR
INTRAVENOUS | Status: AC
  Filled 2020-05-03: qty 100

## 2020-05-03 MED ORDER — ENOXAPARIN SODIUM 40 MG/0.4ML ~~LOC~~ SOLN
40.0000 mg | SUBCUTANEOUS | Status: DC
  Administered 2020-05-04: 40 mg via SUBCUTANEOUS
  Filled 2020-05-03: qty 0.4

## 2020-05-03 MED ORDER — ASPIRIN EC 81 MG PO TBEC
81.0000 mg | DELAYED_RELEASE_TABLET | Freq: Every day | ORAL | Status: DC
  Administered 2020-05-04: 81 mg via ORAL
  Filled 2020-05-03: qty 1

## 2020-05-03 MED ORDER — STROKE: EARLY STAGES OF RECOVERY BOOK: Freq: Once | Status: AC

## 2020-05-03 MED ORDER — HYDROCERIN EX CREA
TOPICAL_CREAM | Freq: Two times a day (BID) | CUTANEOUS | Status: DC
  Administered 2020-05-04: 1 via TOPICAL
  Filled 2020-05-03 (×2): qty 113

## 2020-05-03 MED ORDER — LABETALOL HCL 5 MG/ML IV SOLN
INTRAVENOUS | Status: AC
  Filled 2020-05-03: qty 4

## 2020-05-03 MED ORDER — ACETAMINOPHEN 325 MG PO TABS: 650.0000 mg | ORAL_TABLET | ORAL | Status: DC | PRN

## 2020-05-03 NOTE — ED Triage Notes (Addendum)
Pt here via [facility] from home.  [facility] called out due to pt having increased asphasia. Per daughter, pt typically has issues with her words coming out, however pt is usually aware of this. Today, pt has been worse and had intermittent episodes of confusion. Per family, this happened 6x last year, pt was admitted to hospital x3 times but they were unable to find a cause for pt's symptoms.   LKW between 1630-1730  BP 183/101, pt with hx of htn and usually self medicates with an extra 15mg  of BP medication.  cbg 125  [facility] VS- bp 183/101, hr 104, temp 97.6 oral, co2 18, RA o2 sats 90%.

## 2020-05-03 NOTE — H&P (Signed)
History and Physical    Kellie Harris TJQ:[id number] DOB: Dec 18, 1930 DOA: 05/03/2020  PCP: Dion Body, MD  Patient coming from: Home  I have personally briefly reviewed patient's old medical records in [place]  Chief Complaint: Expressive aphasia  HPI: Kellie Harris is a 86 y.o. female with medical history significant for stroke-like episode versus seizure (s/p tPA 04/03/2018), HTN, HLD, hyperparathyroidism/hypercalcemia (s/p left inferior parathyroidectomy 03/27/2019), and lumbar DDD who presents to the ED for evaluation of recurrent and worsened expressive aphasia.  History is limited from patient due to expressive aphasia and is otherwise supplemented by EDP, chart review, and daughter at bedside.  Patient was in her usual state of health earlier today 05/03/2020.  She went for a thyroid ultrasound today for follow-up for known thyroid nodule (ultrasound showed small left upper thyroid nodule that has not changed compared to prior).  Her daughter drove her home around 16 and on the way patient complained of some blurred vision but otherwise was speaking clearly.  At home patient appeared more lethargic than usual with onset of expressive aphasia.  She has had multiple similar episodes in the past without objective findings of acute CVA or seizure at those times.  Per previous documentation she was previously on Keppra and did have breakthrough spells while on Keppra.   Daughter states that this episode is similar to those previous episodes.  Patient is able to understand when people speak to her and follows commands but has difficulty saying the right words when she speaks.  She has not had any slurred speech, facial droop, lightheadedness/dizziness, nausea, vomiting, change in sensation, or new weakness in her arms or legs.  Patient normally ambulates with the use of a walker without significant change in gait per daughter.  ED Course:  Initial vitals showed BP 194/113, pulse  113, RR 15, temp 97.7 Fahrenheit, SPO2 92% on room air.  Labs show WBC 12.2, hemoglobin 14.7, platelets 291,000, sodium 138, potassium 3.8, bicarb 23, BUN 11, creatinine 0.8, serum glucose 116, AST 21, ALT 13, alk phos 67, total bilirubin 1.2, serum ethanol <10.  SARS-CoV-2 PCR is ordered and pending. Urinalysis and UDS are ordered and pending.  CT head is negative for acute intracranial abnormality. 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  Patient was given aspirin 324 mg once. On-call teleneurology were consulted and recommended starting IV Vimpat 50 mg twice daily for potential seizure-like activity. They recommended medical admission for further work-up including MRI brain and EEG. The hospitalist service was consulted to admit for further evaluation and management.  Review of Systems:  All systems reviewed and are negative except as documented in history of present illness above.   Past Medical History:  Diagnosis Date  . Arthritis    right knee, left shoulder/ OSTEO  . Cancer ([place])    skin   . Degenerative disc disease, lumbar    L4-L5  . Diverticulosis    ITIS  . History of hiatal hernia   . Hydronephrosis    right  . Hypercholesteremia   . Hypertension    CONTROLLED ON MEDS  . IBS (irritable bowel syndrome)   . Knee fracture, right 03/05/2015   DIFFICULTY WITH AMBULATION  . Osteopenia   . Ovarian cyst   . Pancreatic insufficiency   . Wears hearing aid    bilateral    Past Surgical History:  Procedure Laterality Date  . CATARACT EXTRACTION W/PHACO Right 11/02/2015  Procedure: CATARACT EXTRACTION PHACO AND INTRAOCULAR LENS PLACEMENT (IOC);  Surgeon: Leandrew Koyanagi, MD;  Location: [facility];  Service: Ophthalmology;  Laterality: Right;  . CATARACT EXTRACTION W/PHACO Left 07/18/2016   Procedure: CATARACT EXTRACTION PHACO AND INTRAOCULAR LENS PLACEMENT (IOC);  Surgeon:  Leandrew Koyanagi, MD;  Location: [facility];  Service: Ophthalmology;  Laterality: Left;  . COLONOSCOPY    . FRACTURE SURGERY Left    hip  . history of left hip fracture    . INTRAMEDULLARY (IM) NAIL INTERTROCHANTERIC Left 02/12/2017   Procedure: INTRAMEDULLARY (IM) NAIL INTERTROCHANTRIC;  Surgeon: Earnestine Leys, MD;  Location: [facility];  Service: Orthopedics;  Laterality: Left;  . kidney stent    . OOPHORECTOMY Bilateral 2015  . ORIF WRIST FRACTURE Right 04/08/2018   Procedure: OPEN REDUCTION INTERNAL FIXATION (ORIF) WRIST FRACTURE;  Surgeon: Altamese Ontonagon, MD;  Location: [place];  Service: Orthopedics;  Laterality: Right;  . TONSILLECTOMY    . TOTAL KNEE ARTHROPLASTY Right 08/07/2017   Procedure: TOTAL KNEE ARTHROPLASTY;  Surgeon: Earnestine Leys, MD;  Location: [facility];  Service: Orthopedics;  Laterality: Right;    Social History:  reports that she has never smoked. She has never used smokeless tobacco. She reports that she does not drink alcohol and does not use drugs.  No Known Allergies  Family History  Problem Relation Age of Onset  . Breast cancer Mother 22  . Leukemia Mother   . Stroke Father      Prior to Admission medications   Medication Sig Start Date End Date Taking? Authorizing Provider  acetaminophen (TYLENOL) 500 MG tablet Take 500-1,000 mg by mouth every 6 (six) hours as needed (for pain or headaches).    [provider]  amLODipine (NORVASC) 5 MG tablet Take 5 mg by mouth daily.    [provider]  amoxicillin (AMOXIL) 500 MG capsule Take 2,000 mg by mouth See admin instructions. Take 2,000 mg by mouth one hour prior to dental procedures 01/01/18   [provider]  clopidogrel (PLAVIX) 75 MG tablet Take 75 mg by mouth daily.    [provider]  levETIRAcetam (KEPPRA XR) 500 MG 24 hr tablet Take 1 tablet (500 mg total) by mouth daily. 06/03/18   Garvin Fila, MD  pravastatin (PRAVACHOL) 20 MG tablet Take 1 tablet (20  mg total) by mouth at bedtime. 08/09/17   Earnestine Leys, MD  Probiotic CAPS Take 1 capsule by mouth daily as needed (when IBS flares).    [provider]    Physical Exam: Vitals:   05/03/20 1905 05/03/20 1917 05/03/20 1917  BP:  (!) 194/113   Pulse:  (!) 114 (!) 113  Resp:  20 15  Temp:   97.7 F (36.5 C)  TempSrc:   Oral  SpO2:  92% 93%  Weight: 44.5 kg    Height: _0  (1.473 m)     Constitutional: Elderly woman resting supine in bed, NAD, calm, comfortable Eyes: PERRL, EOMI, lids and conjunctivae normal ENMT: Mucous membranes are moist. Posterior pharynx clear of any exudate or lesions.Normal dentition.  Neck: normal, supple, no masses. Respiratory: clear to auscultation bilaterally, no wheezing, no crackles. Normal respiratory effort. No accessory muscle use.  Cardiovascular: Tachycardic with regular rhythm, no murmurs / rubs / gallops. No extremity edema. 2+ pedal pulses. Abdomen: no tenderness, no masses palpated. No hepatosplenomegaly. Bowel sounds positive.  Musculoskeletal: no clubbing / cyanosis. No joint deformity upper and lower extremities. Good ROM, no contractures. Normal muscle tone.  Skin:  no rashes, lesions, ulcers. No induration Neurologic: Exhibits expressive aphasia otherwise CN 2-12 grossly intact. Sensation intact, Strength 5/5 in all 4.  Follows commands. Psychiatric: Alert and oriented x 3.   Labs on Admission: I have personally reviewed following labs and imaging studies  CBC: Recent Labs  Lab 05/03/20 1912  WBC 12.2*  NEUTROABS 10.0*  HGB 14.7  HCT 43.7  MCV 88.1  PLT 878   Basic Metabolic Panel: Recent Labs  Lab 05/03/20 1912  NA 138  K 3.8  CL 98  CO2 23  GLUCOSE 116*  BUN 11  CREATININE 0.80  CALCIUM 10.6*   GFR: Estimated Creatinine Clearance: 32 mL/min (by C-G formula based on SCr of 0.8 mg/dL). Liver Function Tests: Recent Labs  Lab 05/03/20 1912  AST 21  ALT 13  ALKPHOS 67  BILITOT 1.2  PROT 8.0  ALBUMIN  5.2*   No results for input(s): LIPASE, AMYLASE in the last 168 hours. No results for input(s): AMMONIA in the last 168 hours. Coagulation Profile: Recent Labs  Lab 05/03/20 1912  INR 0.9   Cardiac Enzymes: No results for input(s): CKTOTAL, CKMB, CKMBINDEX, TROPONINI in the last 168 hours. BNP (last 3 results) No results for input(s): PROBNP in the last 8760 hours. HbA1C: No results for input(s): HGBA1C in the last 72 hours. CBG: Recent Labs  Lab 05/03/20 1911  GLUCAP 120*   Lipid Profile: No results for input(s): CHOL, HDL, LDLCALC, TRIG, CHOLHDL, LDLDIRECT in the last 72 hours. Thyroid Function Tests: No results for input(s): TSH, T4TOTAL, FREET4, T3FREE, THYROIDAB in the last 72 hours. Anemia Panel: No results for input(s): VITAMINB12, FOLATE, FERRITIN, TIBC, IRON, RETICCTPCT in the last 72 hours. Urine analysis:    Component Value Date/Time   COLORURINE STRAW (A) 11/04/2017 2039   APPEARANCEUR CLEAR (A) 11/04/2017 2039   APPEARANCEUR Clear 07/08/2014 2155   LABSPEC 1.014 11/04/2017 2039   LABSPEC 1.003 07/08/2014 2155   PHURINE 7.0 11/04/2017 2039   GLUCOSEU NEGATIVE 11/04/2017 2039   GLUCOSEU Negative 07/08/2014 2155   HGBUR MODERATE (A) 11/04/2017 2039   BILIRUBINUR NEGATIVE 11/04/2017 2039   BILIRUBINUR Negative 07/08/2014 2155   KETONESUR NEGATIVE 11/04/2017 2039   PROTEINUR NEGATIVE 11/04/2017 2039   NITRITE NEGATIVE 11/04/2017 2039   LEUKOCYTESUR TRACE (A) 11/04/2017 2039   LEUKOCYTESUR Negative 07/08/2014 2155    Radiological Exams on Admission: CT HEAD CODE STROKE WO CONTRAST  Result Date: 05/03/2020 CLINICAL DATA:  Code stroke. Neuro deficit, acute, stroke suspected. Aphasia onset 1730. EXAM: CT HEAD WITHOUT CONTRAST TECHNIQUE: Contiguous axial images were obtained from the base of the skull through the vertex without intravenous contrast. COMPARISON:  Brain MRI 04/04/2018.  Noncontrast head CT 04/03/2018. FINDINGS: Brain: Stable, mild to moderate  generalized parenchymal atrophy. As before, there is advanced ill-defined hypoattenuation within the cerebral white matter which is nonspecific, but consistent with chronic small vessel ischemic disease. Redemonstrated remote lacunar infarcts within the bilateral thalami and right basal ganglia/hemispheric cerebral white matter. Known small chronic infarcts within the cerebellar hemispheres. There is no acute intracranial hemorrhage. No acute demarcated cortical infarct is identified. No extra-axial fluid collection. No evidence of intracranial mass. No midline shift. Vascular: No definite hyperdense vessel. Atherosclerotic calcifications Skull: Normal. Negative for fracture or focal lesion. Sinuses/Orbits: Visualized orbits show no acute finding. Bilateral lens replacements. Opacification of the partially imaged left maxillary sinus. No significant mastoid effusion. ASPECTS [facility] Stroke Program Early CT Score) - Ganglionic level infarction (caudate, lentiform nuclei, internal capsule, insula, M1-M3 cortex): 7 -  Supraganglionic infarction (M4-M6 cortex): 3 Total score (0-10 with 10 being normal): 10 (when discounting chronic infarcts). These results were called by telephone at the time of interpretation on 05/03/2020 at 7:18 pm to provider MARK QUALE , who verbally acknowledged these results. IMPRESSION: No CT evidence of acute intracranial abnormality. APSECTS is 10 (when discounting chronic infarcts). Stable generalized parenchymal atrophy and advanced cerebral white matter chronic small vessel ischemic disease. Known chronic infarcts within the thalami and right basal ganglia/hemispheric cerebral white matter as well as cerebellum. Left maxillary sinusitis, incompletely imaged. Electronically Signed   By: Kellie Simmering DO   On: 05/03/2020 19:23    EKG: Independently reviewed. Sinus tachycardia, RBBB, motion artifact.  Rate is faster when compared to prior.  Assessment/Plan Principal Problem:   Expressive  aphasia Active Problems:   HLD (hyperlipidemia)   History of CVA (cerebrovascular accident)   Benign essential HTN  Kellie Harris is a 86 y.o. female with medical history significant for stroke-like episode versus seizure (s/p tPA 04/03/2018), HTN, HLD, hyperparathyroidism/hypercalcemia (s/p left inferior parathyroidectomy 03/27/2019), and lumbar DDD who is admitted for evaluation of expressive aphasia.  Expressive aphasia History of TIA versus seizure like activity: Multiple similar episodes in the past without findings of acute CVA or seizure on previous admissions.  She continues to have expressive aphasia on admission though seems to be improving per daughter.  Teleneurology were consulted and recommended starting IV Vimpat for potential seizure activity. -Continue IV Vimpat 50 mg twice daily per neurology -Obtain MRI brain without contrast -EEG -Echocardiogram -Carotid Dopplers -Monitor on telemetry, continue neurochecks -Check A1c and lipid panel -PT/OT/SLP eval -Continue aspirin 81 mg daily and pravastatin 20 mg daily -Allow permissive hypertension up to 220/110 for now  Hypertension: Hold home amlodipine and lisinopril to allow for permissive hypertension as above.  Use IV labetalol if needed for SBP >220 and/or DBP >110.  Hyperlipidemia: Continue pravastatin.  Hyperparathyroidism/hypercalcemia: S/p left inferior parathyroidectomy 03/27/2019.  Follows with endocrinology.  Mild hypercalcemia on admission.  Will place on IV fluid hydration overnight.  DVT prophylaxis: Lovenox Code Status: DNR, confirmed with patient and daughter Family Communication: Discussed with patient's daughter at bedside Disposition Plan: From home, dispo pending further CVA/seizure like activity work-up and PT/OT/SLP eval Consults called: Teleneurology Admission status:  Status is: Observation  The patient remains OBS appropriate and will d/c before 2 midnights.  Dispo: The patient is from: Home               Anticipated d/c is to: Home              Anticipated d/c date is: 1 day              Patient currently is not medically stable to d/c.   Zada Finders MD [facility]  If 7PM-7AM, please contact night-coverage [email or web address]  05/03/2020, 9:10 PM

## 2020-05-03 NOTE — ED Notes (Signed)
ED Provider at bedside speaking to tele neuro.

## 2020-05-03 NOTE — ED Notes (Signed)
Per daughter- at approx 1645 pt starting complaining of blurred vision. Pt was not aphasic at this time.

## 2020-05-03 NOTE — ED Notes (Signed)
Unable to call report to the floor at this time. Pt remains in MRI.

## 2020-05-03 NOTE — ED Notes (Signed)
Code stroke called 1919

## 2020-05-03 NOTE — ED Notes (Signed)
US at bedside

## 2020-05-03 NOTE — ED Notes (Signed)
CBG 120 

## 2020-05-03 NOTE — Consult Note (Signed)
[facility] Tele[facility]urology Consult Services   Date of Service:   05/03/2020 19:21:00  Impression:     .  G41.2 - Complex partial status epilepticus     .  R56.9 - Seizures  Comments/Sign-Out: I discussed the case in detail with the patient. We talked in detail about treatment options. We talked about management of the symptoms. Also discussed the case in detail with patient daughter Ms. Christy. Patient had multiple episodes similar to this in the past and the work-up has been negative. We talked about conservative treatment. Patient is already getting better by the time I finished my exam. I suspect that these episodes most likely are complex partial seizures. She did not have any facial drooping or any weakness in her arms or legs. We talked about starting her on Vimpat. I would start her on Vimpat IV 50 mg twice daily. We will continue her on this for 24 hours and then switch her to Vimpat p.o. 100 mg twice daily which she will continue as an outpatient. I would recommend getting an MRI and EEG and neurology follow-up. We talked about IV alteplase. We talked about possibility of a stroke and we talked about risks and complications. At this point discussing with the family, the decision was made not to proceed with IV alteplase as this is very similar to her previous presentations and she had multiple presentations over the past few years which are similar to this. Also she is already getting better and by the time I finished my exam her NIH stroke scale was only 1.  Metrics: Last Known Well: 05/03/2020 16:30:00 [facility] Notification Time: 05/03/2020 19:21:00 Arrival Time: 05/03/2020 18:51:00 Stamp Time: 05/03/2020 19:21:00 Telephone Response Time: 05/03/2020 19:24:00 Time First Login Attempt: 05/03/2020 19:24:00 Symptoms: Speech difficulty. NIHSS Start Assessment Time: 05/03/2020 19:37:00 Patient is not a candidate for Thrombolytic. Thrombolytic Medical Decision:  05/03/2020 19:51:27 Patient was not deemed candidate for Thrombolytic because of following reasons: Other Diagnosis suspected.  CT head showed no acute hemorrhage or acute core infarct.  ED Physician notified of diagnostic impression and management plan on 05/03/2020 20:00:50  Advanced Imaging: Advanced Imaging Not Recommended because:  Clinical Presentation is not Suggestive of LVO and NIHSS is <6   Our recommendations are outlined below.  Recommendations:     .  Activate Stroke Protocol Admission/Order Set     .  Stroke/Telemetry Floor     .  Neuro Checks     .  Bedside Swallow Eval     .  DVT Prophylaxis     .  IV Fluids, Normal Saline     .  Head of Bed 30 Degrees     .  Euglycemia and Avoid Hyperthermia (PRN Acetaminophen)     .  Antiplatelet Therapy Recommended  Routine Consultation with [facility] for Follow up Care  Sign Out:     .  Discussed with Emergency Department Provider    ------------------------------------------------------------------------------  History of Present Illness: Patient is a 86 year old Female.  Patient was brought by EMS for symptoms of Speech difficulty.  86 year old white female with past medical history of hypertension came to the hospital because of aphasia. History is obtained from patient daughter and the family present at the bedside. Patient had recurrent episodes over the past few years when she became confused, disoriented and had extensive work-up done including MRIs and EEGs which have been negative. At one point she was on Keppra and had few breakthrough episodes. She started having this speech  difficulty this afternoon which according to the family is very similar to her previous episodes. She does not have any focal weakness. Denies any headaches. Denies double vision.    Past Medical History:     . Hypertension     . Hyperlipidemia     . Stroke  Social History: Smoking: No Alcohol Use: No Drug Use:  No  Family History:htn  Review of System:  14 Points Review of Systems was performed and was negative except mentioned in HPI.  Anticoagulant use:  No  Antiplatelet use: No     Examination: BP(192/92), Pulse(118), Blood Glucose(120) 1A: Level of Consciousness - Alert; keenly responsive + 0 1B: Ask Month and Age - Aphasic + 2 1C: Blink Eyes & Squeeze Hands - Performs 1 Task + 1 2: Test Horizontal Extraocular Movements - Normal + 0 3: Test Visual Fields - No Visual Loss + 0 4: Test Facial Palsy (Use Grimace if Obtunded) - Normal symmetry + 0 5A: Test Left Arm Motor Drift - No Drift for 10 Seconds + 0 5B: Test Right Arm Motor Drift - No Drift for 10 Seconds + 0 6A: Test Left Leg Motor Drift - No Drift for 5 Seconds + 0 6B: Test Right Leg Motor Drift - No Drift for 5 Seconds + 0 7: Test Limb Ataxia (FNF/Heel-Shin) - No Ataxia + 0 8: Test Sensation - Normal; No sensory loss + 0 9: Test Language/Aphasia - Severe Aphasia: Fragmentary Expression, Inference Needed, Cannot Identify Materials + 2 10: Test Dysarthria - Normal + 0 11: Test Extinction/Inattention - No abnormality + 0  NIHSS Score: 5  Pre-Morbid Modified Rankin Scale: 0 Points = No symptoms at all   Patient/Family was informed the Neurology Consult would occur via TeleHealth consult by way of interactive audio and video telecommunications and consented to receiving care in this manner.   Patient is being evaluated for possible acute neurologic impairment and high probability of imminent or life-threatening deterioration. I spent total of 40 minutes providing care to this patient, including time for face to face visit via telemedicine, review of medical records, imaging studies and discussion of findings with providers, the patient and/or family.   Dr Faustino Congress   [facility] [phone]  Case [id number]

## 2020-05-03 NOTE — ED Provider Notes (Signed)
[facility] Emergency Department Provider Note   ____________________________________________   First MD Initiated Contact with Patient 05/03/20 1913     (approximate)  I have reviewed the triage vital signs and the nursing notes.   HISTORY  Chief Complaint Aphasia  EM caveat: Patient with some expressive aphasia majority of history comes from daughter  HPI Kellie Harris is a 86 y.o. female who per the daughter has had 2 or 3 similar episodes where she will have difficulty speaking once of which they believe she received TPA but then also was later diagnosed as a possible seizure  This evening at about 4:45 PM daughter reports that her mother reported some difficulty seemed a bit confused and they noticed she was having trouble speaking.  She has had difficulty expressing words and her daughter use the word "aphasia"  Very similar episodes in the past few times.    Past Medical History:  Diagnosis Date  . Arthritis    right knee, left shoulder/ OSTEO  . Cancer ([place])    skin   . Degenerative disc disease, lumbar    L4-L5  . Diverticulosis    ITIS  . History of hiatal hernia   . Hydronephrosis    right  . Hypercholesteremia   . Hypertension    CONTROLLED ON MEDS  . IBS (irritable bowel syndrome)   . Knee fracture, right 03/05/2015   DIFFICULTY WITH AMBULATION  . Osteopenia   . Ovarian cyst   . Pancreatic insufficiency   . Wears hearing aid    bilateral    Patient Active Problem List   Diagnosis Date Noted  . Expressive aphasia 05/03/2020  . S/P ORIF (open reduction internal fixation) fracture radius and ulnar fx 04/08/18 04/10/2018  . Distal radius and ulna fracture, right   . History of CVA (cerebrovascular accident)   . Benign essential HTN   . Tachycardia   . Leukocytosis   . Seizures ([place])   . Stroke-like episode ([place]) s/p IV tPA 04/03/2018  . Syncope   . TIA (transient ischemic attack) 11/04/2017  . Malnutrition of moderate  degree 08/26/2017  . Total knee replacement status 08/07/2017  . Closed left hip fracture ([place]) 02/12/2017  . HTN (hypertension) 02/12/2017  . HLD (hyperlipidemia) 02/12/2017    Past Surgical History:  Procedure Laterality Date  . CATARACT EXTRACTION W/PHACO Right 11/02/2015   Procedure: CATARACT EXTRACTION PHACO AND INTRAOCULAR LENS PLACEMENT (IOC);  Surgeon: Leandrew Koyanagi, MD;  Location: [place];  Service: Ophthalmology;  Laterality: Right;  . CATARACT EXTRACTION W/PHACO Left 07/18/2016   Procedure: CATARACT EXTRACTION PHACO AND INTRAOCULAR LENS PLACEMENT (IOC);  Surgeon: Leandrew Koyanagi, MD;  Location: [place];  Service: Ophthalmology;  Laterality: Left;  . COLONOSCOPY    . FRACTURE SURGERY Left    hip  . history of left hip fracture    . INTRAMEDULLARY (IM) NAIL INTERTROCHANTERIC Left 02/12/2017   Procedure: INTRAMEDULLARY (IM) NAIL INTERTROCHANTRIC;  Surgeon: Earnestine Leys, MD;  Location: [facility];  Service: Orthopedics;  Laterality: Left;  . kidney stent    . OOPHORECTOMY Bilateral 2015  . ORIF WRIST FRACTURE Right 04/08/2018   Procedure: OPEN REDUCTION INTERNAL FIXATION (ORIF) WRIST FRACTURE;  Surgeon: Altamese Slatington, MD;  Location: [place];  Service: Orthopedics;  Laterality: Right;  . TONSILLECTOMY    . TOTAL KNEE ARTHROPLASTY Right 08/07/2017   Procedure: TOTAL KNEE ARTHROPLASTY;  Surgeon: Earnestine Leys, MD;  Location: [facility];  Service: Orthopedics;  Laterality: Right;  Prior to Admission medications   Medication Sig Start Date End Date Taking? Authorizing Provider  acetaminophen (TYLENOL) 500 MG tablet Take 500-1,000 mg by mouth every 6 (six) hours as needed (for pain or headaches).    [provider]  amLODipine (NORVASC) 5 MG tablet Take 5 mg by mouth daily.    [provider]  amoxicillin (AMOXIL) 500 MG capsule Take 2,000 mg by mouth See admin instructions. Take 2,000 mg by mouth one hour prior to dental procedures  01/01/18   [provider]  clopidogrel (PLAVIX) 75 MG tablet Take 75 mg by mouth daily.    [provider]  levETIRAcetam (KEPPRA XR) 500 MG 24 hr tablet Take 1 tablet (500 mg total) by mouth daily. 06/03/18   Garvin Fila, MD  pravastatin (PRAVACHOL) 20 MG tablet Take 1 tablet (20 mg total) by mouth at bedtime. 08/09/17   Earnestine Leys, MD  Probiotic CAPS Take 1 capsule by mouth daily as needed (when IBS flares).    [provider]    Allergies Patient has no known allergies.  Family History  Problem Relation Age of Onset  . Breast cancer Mother 10  . Leukemia Mother   . Stroke Father     Social History Social History   Tobacco Use  . Smoking status: Never Smoker  . Smokeless tobacco: Never Used  Vaping Use  . Vaping Use: Never used  Substance Use Topics  . Alcohol use: No  . Drug use: No    Review of Systems EM caveat   ____________________________________________   PHYSICAL EXAM:  VITAL SIGNS: ED Triage Vitals  Enc Vitals Group     BP 05/03/20 1917 (!) 194/113     Pulse Rate 05/03/20 1917 (!) 114     Resp 05/03/20 1917 20     Temp 05/03/20 1917 97.7 F (36.5 C)     Temp Source 05/03/20 1917 Oral     SpO2 05/03/20 1917 92 %     Weight 05/03/20 1905 98 lb 1.7 oz (44.5 kg)     Height 05/03/20 1905 4\' 10"  (1.473 m)     Head Circumference --      Peak Flow --      Pain Score 05/03/20 1905 0     Pain Loc --      Pain Edu? --      Excl. in [place]? --     Constitutional: Alert and oriented difficulty expressing herself with words but follows commands well. Well appearing and in no acute distress. Eyes: Conjunctivae are normal. Head: Atraumatic. Nose: No congestion/rhinnorhea. Mouth/Throat: Mucous membranes are moist. Neck: No stridor.  Cardiovascular: Normal rate, regular rhythm. Grossly normal heart sounds.  Good peripheral circulation. Respiratory: Normal respiratory effort.  No retractions. Lungs CTAB. Gastrointestinal: Soft  and nontender. No distention. Musculoskeletal: No lower extremity tenderness nor edema. Neurologic: Occasional clear speech but frequently expressing the wrong word.  She does however follow commands normally.  Hard of hearing.  No pronator drift in any extremity.  Moves all extremities with 5 out of 5 strength.  Denies sensory deficits.  No facial droop.  NIH equals 1. VAN negative (no motor symptoms) Skin:  Skin is warm, [place] and intact. No rash noted. Psychiatric: Mood and affect are normal. Speech and behavior are normal.  ____________________________________________   LABS (all labs ordered are listed, but only abnormal results are displayed)  Labs Reviewed  CBC - Abnormal; Notable for the following components:      Result  Value   WBC 12.2 (*)    All other components within normal limits  DIFFERENTIAL - Abnormal; Notable for the following components:   Neutro Abs 10.0 (*)    All other components within normal limits  COMPREHENSIVE METABOLIC PANEL - Abnormal; Notable for the following components:   Glucose, Bld 116 (*)    Calcium 10.6 (*)    Albumin 5.2 (*)    Anion gap 17 (*)    All other components within normal limits  GLUCOSE, CAPILLARY - Abnormal; Notable for the following components:   Glucose-Capillary 120 (*)    All other components within normal limits  SARS CORONAVIRUS 2 BY RT PCR (HOSPITAL ORDER, [place] [facility])  ETHANOL  PROTIME-INR  APTT  URINE DRUG SCREEN, QUALITATIVE ([facility] ONLY)  URINALYSIS, ROUTINE W REFLEX MICROSCOPIC  I-STAT CREATININE, ED   ____________________________________________  EKG  Reviewed interpreted at [place] rate 110 QRS 110 QTc 470 Right bundle branch block.  Nonspecific T wave abnormality, notable artifact.  Difficult to exclude ischemic change, but overall appears right bundle with artifact, I do not see evidence of acute MI ____________________________________________  RADIOLOGY    Personally reviewed  head CT, negative for acute hemorrhage pending radiology over read  Discussed CT head upon its results with radiologist, they advised old strokes but no evidence of acute stroke or  hemorrhage ____________________________________________   PROCEDURES  Procedure(s) performed: None  Procedures  Critical Care performed: Yes, see critical care note(s)  CRITICAL CARE Performed by: Delman Kitten   Total critical care time: 35 minutes  Critical care time was exclusive of separately billable procedures and treating other patients.  Critical care was necessary to treat or prevent imminent or life-threatening deterioration.  Critical care was time spent personally by me on the following activities: development of treatment plan with patient and/or surrogate as well as nursing, discussions with consultants, evaluation of patient's response to treatment, examination of patient, obtaining history from patient or surrogate, ordering and performing treatments and interventions, ordering and review of laboratory studies, ordering and review of radiographic studies, pulse oximetry and re-evaluation of patient's condition.  ____________________________________________   INITIAL IMPRESSION / ASSESSMENT AND PLAN / ED COURSE  Pertinent labs & imaging results that were available during my care of the patient were reviewed by me and considered in my medical decision making (see chart for details).     Clinical Course as of May 03 2036  Tue May 03, 2020  1913 Intial MD eval DELAYED due to active CPR/Arrest for another patient in ER.    [MQ]    Clinical Course User Index [MQ] Delman Kitten, MD   ----------------------------------------- 8:36 PM on 05/03/2020 -----------------------------------------  Based on patient's previous history, low NIH, discussed with patient and family no TPA given.  Patient to be admitted IV Vimpat ordered as per recommendations from neurology.  Admission discussed with  Dr. Posey Pronto.  Patient and family understandable agreeable with plan for admission.  ____________________________________________   FINAL CLINICAL IMPRESSION(S) / ED DIAGNOSES  Final diagnoses:  Expressive aphasia        Note:  This document was prepared using Dragon voice recognition software and may include unintentional dictation errors       Delman Kitten, MD 05/03/20 2037

## 2020-05-03 NOTE — ED Notes (Signed)
Remo Lipps RN floor nurse will read report on chart.

## 2020-05-04 ENCOUNTER — Observation Stay (HOSPITAL_BASED_OUTPATIENT_CLINIC_OR_DEPARTMENT_OTHER)
Admit: 2020-05-04 | Discharge: 2020-05-04 | Disposition: A | Discharge status: Home / Self Care | Payer: PPO | Attending: Internal Medicine | Admitting: Internal Medicine

## 2020-05-04 ENCOUNTER — Encounter: Payer: Self-pay | Admitting: Internal Medicine

## 2020-05-04 DIAGNOSIS — D72829 Elevated white blood cell count, unspecified: Secondary | ICD-10-CM

## 2020-05-04 DIAGNOSIS — R4701 Aphasia: Secondary | ICD-10-CM | POA: Diagnosis not present

## 2020-05-04 DIAGNOSIS — I10 Essential (primary) hypertension: Secondary | ICD-10-CM | POA: Diagnosis not present

## 2020-05-04 DIAGNOSIS — I351 Nonrheumatic aortic (valve) insufficiency: Secondary | ICD-10-CM | POA: Diagnosis not present

## 2020-05-04 DIAGNOSIS — E785 Hyperlipidemia, unspecified: Secondary | ICD-10-CM | POA: Diagnosis not present

## 2020-05-04 LAB — ECHOCARDIOGRAM COMPLETE
AR max vel: 4.17 cm2
AV Area VTI: 3.91 cm2
AV Area mean vel: 3.35 cm2
AV Mean grad: 4 mmHg
AV Peak grad: 6.3 mmHg
Ao pk vel: 1.25 m/s
Area-P 1/2: 2.19 cm2
Height: 58 in
S' Lateral: 2.11 cm
Weight: 1569.68 oz

## 2020-05-04 LAB — LIPID PANEL
Cholesterol: 234 mg/dL — ABNORMAL HIGH (ref 0–200)
HDL: 76 mg/dL (ref >40)
LDL Cholesterol: 135 mg/dL — ABNORMAL HIGH (ref 0–99)
Total CHOL/HDL Ratio: 3.1 RATIO
Triglycerides: 113 mg/dL (ref <150)
VLDL: 23 mg/dL (ref 0–40)

## 2020-05-04 LAB — BASIC METABOLIC PANEL
Anion gap: 14 (ref 5–15)
BUN: 9 mg/dL (ref 8–23)
CO2: 24 mmol/L (ref 22–32)
Calcium: 9.5 mg/dL (ref 8.9–10.3)
Chloride: 100 mmol/L (ref 98–111)
Creatinine, Ser: 0.74 mg/dL (ref 0.44–1.00)
GFR calc Af Amer: >60 mL/min (ref >60)
GFR calc non Af Amer: >60 mL/min (ref >60)
Glucose, Bld: 111 mg/dL — ABNORMAL HIGH (ref 70–99)
Potassium: 3.3 mmol/L — ABNORMAL LOW (ref 3.5–5.1)
Sodium: 138 mmol/L (ref 135–145)

## 2020-05-04 LAB — CBC
HCT: 40.9 % (ref 36.0–46.0)
Hemoglobin: 13.6 g/dL (ref 12.0–15.0)
MCH: 29.8 pg (ref 26.0–34.0)
MCHC: 33.3 g/dL (ref 30.0–36.0)
MCV: 89.5 fL (ref 80.0–100.0)
Platelets: 247 10*3/uL (ref 150–400)
RBC: 4.57 MIL/uL (ref 3.87–5.11)
RDW: 14.8 % (ref 11.5–15.5)
WBC: 12.4 10*3/uL — ABNORMAL HIGH (ref 4.0–10.5)
nRBC: 0 % (ref 0.0–0.2)

## 2020-05-04 LAB — HEMOGLOBIN A1C
Hgb A1c MFr Bld: 4.9 % (ref 4.8–5.6)
Mean Plasma Glucose: 93.93 mg/dL

## 2020-05-04 MED ORDER — POTASSIUM CHLORIDE 10 MEQ/100ML IV SOLN
10.0000 meq | INTRAVENOUS | Status: AC
  Administered 2020-05-04 (×4): 10 meq via INTRAVENOUS
  Filled 2020-05-04 (×3): qty 100

## 2020-05-04 MED ORDER — SENNOSIDES-DOCUSATE SODIUM 8.6-50 MG PO TABS: 1.0000 | ORAL_TABLET | Freq: Every evening | ORAL | 0 refills | Status: DC | PRN

## 2020-05-04 MED ORDER — PRAVASTATIN SODIUM 20 MG PO TABS: 40.0000 mg | ORAL_TABLET | Freq: Every day | ORAL | Status: DC

## 2020-05-04 MED ORDER — PRAVASTATIN SODIUM 40 MG PO TABS
40.0000 mg | ORAL_TABLET | Freq: Every day | ORAL | 0 refills | Status: DC
Start: 2020-05-04 — End: 2021-06-13

## 2020-05-04 NOTE — Progress Notes (Signed)
SLP Cancellation Note  Patient Details Name: Kellie Harris MRN: [id number] DOB: 08-Aug-1931   Cancelled treatment:       Reason Eval/Treat Not Completed: SLP screened, no needs identified, will sign off   Consult received, chart reviewed, this Probation officer spoke with nursing and screen provided. Pt is expressively able to communicate wants/needs as her expressive abilities are now at baseline and is is free of overt s/s of aspiration/dysphagia.   ST to sign off.   Lanorris Kalisz B. Rutherford Nail M.S., CCC-SLP, [place] Office [phone]  Stormy Fabian 05/04/2020, 12:43 PM

## 2020-05-04 NOTE — Discharge Summary (Signed)
Physician Discharge Summary  Kaylen Motl Sturdivant TGG:[id number] DOB: 05-23-1931 DOA: 05/03/2020  PCP: Dion Body, MD  Admit date: 05/03/2020 Discharge date: 05/04/2020  Admitted From: Home Disposition: Home  Recommendations for Outpatient Follow-up:  1. Follow up with PCP in 1-2 weeks 2. Follow up with Neurology Dr. Melrose Nakayama within 1-2 weeks 3. Follow up with Dr. Saralyn Pilar within 1-2 weeks and please go to the Office 05/05/20 or 05/06/20 for Holter Monitor vs Discussion about an Implantable Loop Recorder 4. Please obtain CMP/CBC, Mag, PHos in one week 5. Please follow up on the following pending results:  Home Health: No Equipment/Devices: 3in1 Bedside Commode    Discharge Condition: Stable CODE STATUS: DO NOT RESUSCITATE Diet recommendation: Heart Healthy Diet   Brief/Interim Summary: HPI per Dr. Zada Finders on 05/03/20 Brookelynne Dimperio Kluge is a 86 y.o. female with medical history significant for stroke-like episode versus seizure (s/p tPA 04/03/2018), HTN, HLD, hyperparathyroidism/hypercalcemia (s/p left inferior parathyroidectomy 03/27/2019), and lumbar DDD who presents to the ED for evaluation of recurrent and worsened expressive aphasia.  History is limited from patient due to expressive aphasia and is otherwise supplemented by EDP, chart review, and daughter at bedside.  Patient was in her usual state of health earlier today 05/03/2020.  She went for a thyroid ultrasound today for follow-up for known thyroid nodule (ultrasound showed small left upper thyroid nodule that has not changed compared to prior).  Her daughter drove her home around 2 and on the way patient complained of some blurred vision but otherwise was speaking clearly.  At home patient appeared more lethargic than usual with onset of expressive aphasia.  She has had multiple similar episodes in the past without objective findings of acute CVA or seizure at those times.  Per previous documentation she was previously on Keppra and  did have breakthrough spells while on Keppra.   Daughter states that this episode is similar to those previous episodes.  Patient is able to understand when people speak to her and follows commands but has difficulty saying the right words when she speaks.  She has not had any slurred speech, facial droop, lightheadedness/dizziness, nausea, vomiting, change in sensation, or new weakness in her arms or legs.  Patient normally ambulates with the use of a walker without significant change in gait per daughter.  ED Course:  Initial vitals showed BP 194/113, pulse 113, RR 15, temp 97.7 Fahrenheit, SPO2 92% on room air.  Labs show WBC 12.2, hemoglobin 14.7, platelets 291,000, sodium 138, potassium 3.8, bicarb 23, BUN 11, creatinine 0.8, serum glucose 116, AST 21, ALT 13, alk phos 67, total bilirubin 1.2, serum ethanol <10.  SARS-CoV-2 PCR is ordered and pending. Urinalysis and UDS are ordered and pending.  CT head is negative for acute intracranial abnormality. 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  Patient was given aspirin 324 mg once. On-call teleneurology were consulted and recommended starting IV Vimpat 50 mg twice daily for potential seizure-like activity. They recommended medical admission for further work-up including MRI brain and EEG. The hospitalist service was consulted to admit for further evaluation and management.  Review of Systems:  All systems reviewed and are negative except as documented in history of present illness above.  **Interim History  Telemetry neurology evaluated and felt that she could have had a seizure so they started her on IV Vimpat.  In-house neurology evaluated and felt that she did not have either a seizure or stroke and recommended  no antiepileptics but recommended following up with her primary neurologist and have cardiology evaluate for implantable loop recorder versus a  repeat Holter monitoring.  Echocardiogram was done and showed some grade 1 diastolic dysfunction but relatively unremarkable.  PT OT recommending no home health and SLP evaluated and patient is back to baseline.  Patient daughter confirmed that patient was back to baseline and she had no more expressive aphasia.  Patient has been deemed stable to be discharged and will follow up with PCP, and cardiology, neurology in outpatient setting  Discharge Diagnoses:  Principal Problem:   Expressive aphasia Active Problems:   HLD (hyperlipidemia)   History of CVA (cerebrovascular accident)   Benign essential HTN  Expressive aphasia History of TIA versus seizure like activity: -Multiple similar episodes in the past without findings of acute CVA or seizure on previous admissions.   -She continued to have expressive aphasia on admission though seems to be improving per daughter and per daughter is back to baseline now.   -Teleneurology were consulted and recommended starting IV Vimpat for potential seizure activity but in-house neurology came and felt that she did not have either a seizure or a stroke so they have discontinued IV Vimpat and recommend no antiepileptics for discharge -Continue IV Vimpat 50 mg twice daily per neurology initially -Obtain MRI brain without contrast and showed risk of CVA as it read as "No acute intracranial process. Old RIGHT basal ganglia, RIGHT thalamus and bilateral cerebella lacunar infarcts. Moderate chronic small vessel ischemic disease." -EEG canceled by neurology -Echocardiogram done showed grade 1 diastolic dysfunction but normal EF 65 to 70% -Carotid Dopplers showed "Minor carotid atherosclerosis. No hemodynamically significant ICA stenosis. Degree of narrowing less than 50% bilaterally by ultrasound criteria. No significant interval change compared 2019. Patent antegrade vertebral flow bilaterally" -Monitored on telemetry, continue neurochecks -Check A1c and lipid  panel -PT/OT/SLP eval they have all signed off -Continue aspirin 81 mg daily and pravastatin 20 mg daily but now stopped the aspirin given that she had no stroke: We will increase pravastatin to 40 mg p.o. daily given her hyperlipidemia -Allow permissive hypertension up to 220/110 for now  Hypertension: -Initially held home amlodipine and lisinopril to allow for permissive hypertension as above but can resume now.   -Use IV labetalol if needed for SBP >220 and/or DBP >110. -Blood pressure prior to discharge is 155/93  Hypokalemia -Patient's potassium this morning was 3.3 -Replete prior to discharge -Continue to Monitor and trend in outpatient setting repeat CMP with PCP  Hyperlipidemia: -Lipid panel done and showed a total cholesterol/HDL ratio of 3.1, cholesterol level 234, HDL of 76, LDL 135, triglycerides 113, VLDL of 23 -Continue pravastatin 40 mg.  Hyperparathyroidism/hypercalcemia: -S/p left inferior parathyroidectomy 03/27/2019.  Follows with endocrinology.  Mild hypercalcemia on admission.   -Will place on IV fluid hydration overnight continued today with improvement of her calcium.  Leukocytosis -Mildly reactive -Patient WBC went from 12.2 -> 12.4 -Currently no signs or symptoms of infection as she is afebrile-continue monitor and trend in the outpatient setting and repeat CBC with PCP  Discharge Instructions  Allergies as of 05/04/2020   No Known Allergies     Medication List    STOP taking these medications   levETIRAcetam 500 MG 24 hr tablet Commonly known as: KEPPRA XR     TAKE these medications   acetaminophen 500 MG tablet Commonly known as: TYLENOL Take 500-1,000 mg by mouth every 6 (six) hours as needed (for pain or headaches).   amLODipine  5 MG tablet Commonly known as: NORVASC Take 5 mg by mouth daily.   amoxicillin 500 MG capsule Commonly known as: AMOXIL Take 2,000 mg by mouth See admin instructions. Take 2,000 mg by mouth one hour prior to  dental procedures   lisinopril 10 MG tablet Commonly known as: ZESTRIL Take 10 mg by mouth 2 (two) times daily.   pravastatin 40 MG tablet Commonly known as: PRAVACHOL Take 1 tablet (40 mg total) by mouth daily at 6 PM. What changed:   how much to take  when to take this   senna-docusate 8.6-50 MG tablet Commonly known as: Senokot-S Take 1 tablet by mouth at bedtime as needed for mild constipation.       Follow-up Information    Dion Body, MD. Call.   Specialty: Family Medicine Why: Follow up within 1-2 weeks  Contact information: [place] [address] [phone]        Isaias Cowman, MD. Go on 05/05/2020.   Specialty: Cardiology Why: Follow up for Implantable Loop Recorder vs. Holter. Please go to the Office 05/05/20  Contact information: [address] [facility] [place] [address] [phone]        Anabel Bene, MD. Call.   Specialty: Neurology Why: Follow up with Neurology within 1-2 weeks Contact information: [place] [facility] [place] [address] [phone]              No Known Allergies  Consultations:  TeleNeurology  Neurology  Discussed with Dr. Clayborn Bigness of Cardiology  Procedures/Studies: MR BRAIN WO CONTRAST  Result Date: 05/04/2020 CLINICAL DATA:  Initial evaluation for neuro deficit, stroke suspected. EXAM: MRI HEAD WITHOUT CONTRAST TECHNIQUE: Multiplanar, multiecho pulse sequences of the brain and surrounding structures were obtained without intravenous contrast. COMPARISON:  Prior CT from 05/03/2020. FINDINGS: Brain: Diffuse prominence of the CSF containing spaces compatible with generalized age-related cerebral atrophy. Patchy and confluent T2/FLAIR hyperintensity within the periventricular deep white matter both cerebral hemispheres most consistent with chronic small vessel ischemic disease, moderate in nature.  Multiple scattered remote lacunar infarcts present about the hemispheric cerebral white matter, bilateral thalami, and right greater than left cerebellum. Small area of encephalomalacia and gliosis involving the left occipital lobe compatible with a chronic left PCA territory infarct. No abnormal foci of restricted diffusion to suggest acute or subacute ischemia. Gray-white matter differentiation maintained. No other areas of encephalomalacia to suggest chronic cortical infarction. No foci of susceptibility artifact to suggest acute or chronic intracranial hemorrhage. No mass lesion, midline shift or mass effect. Diffuse ventricular prominence related to global parenchymal volume loss without hydrocephalus. No extra-axial fluid collection. Pituitary gland suprasellar region within normal limits. Midline structures intact. Vascular: Major intracranial vascular flow voids are maintained. Skull and upper cervical spine: Craniocervical junction within normal limits. Bone marrow signal intensity normal. No scalp soft tissue abnormality. Sinuses/Orbits: Patient status post bilateral ocular lens replacement. Globes and orbital soft tissues demonstrate no acute finding. Chronic left maxillary sinus disease noted. Paranasal sinuses are otherwise largely clear. No mastoid effusion. Other: None. IMPRESSION: 1. No acute intracranial abnormality. 2. Chronic left PCA territory infarct, with multiple additional remote lacunar infarcts involving the hemispheric cerebral white matter, bilateral thalami, and right greater than left cerebellum. 3. Underlying age-related cerebral atrophy with moderate chronic small vessel ischemic disease. Electronically Signed   By: Jeannine Boga M.D.   On: 05/04/2020 00:18   US Carotid Bilateral (at [facility] and [facility] only)  Result Date: 05/04/2020  CLINICAL DATA:  Expressive aphasia, hypertension, stroke, blurred vision EXAM: BILATERAL CAROTID DUPLEX ULTRASOUND TECHNIQUE: Pearline Cables scale imaging,  color Doppler and duplex ultrasound were performed of bilateral carotid and vertebral arteries in the neck. COMPARISON:  11/05/2017 FINDINGS: Criteria: Quantification of carotid stenosis is based on velocity parameters that correlate the residual internal carotid diameter with NASCET-based stenosis levels, using the diameter of the distal internal carotid lumen as the denominator for stenosis measurement. The following velocity measurements were obtained: RIGHT ICA: 49/21 cm/sec CCA: 17/61 cm/sec SYSTOLIC ICA/CCA RATIO:  0.7 ECA: 66 cm/sec LEFT ICA: 99/33 cm/sec CCA: 60/73 cm/sec SYSTOLIC ICA/CCA RATIO:  2.0 ECA: 84 cm/sec RIGHT CAROTID ARTERY: Minor echogenic shadowing plaque formation. No hemodynamically significant right ICA stenosis, velocity elevation, or turbulent flow. Degree of narrowing less than 50%. RIGHT VERTEBRAL ARTERY:  Normal antegrade flow LEFT CAROTID ARTERY: Similar scattered minor echogenic plaque formation. No hemodynamically significant left ICA stenosis, velocity elevation, or turbulent flow. LEFT VERTEBRAL ARTERY:  Normal antegrade flow IMPRESSION: Minor carotid atherosclerosis. No hemodynamically significant ICA stenosis. Degree of narrowing less than 50% bilaterally by ultrasound criteria. No significant interval change compared 2019. Patent antegrade vertebral flow bilaterally Electronically Signed   By: Jerilynn Mages.  Shick M.D.   On: 05/04/2020 07:53   CT HEAD CODE STROKE WO CONTRAST  Result Date: 05/03/2020 CLINICAL DATA:  Code stroke. Neuro deficit, acute, stroke suspected. Aphasia onset 1730. EXAM: CT HEAD WITHOUT CONTRAST TECHNIQUE: Contiguous axial images were obtained from the base of the skull through the vertex without intravenous contrast. COMPARISON:  Brain MRI 04/04/2018.  Noncontrast head CT 04/03/2018. FINDINGS: Brain: Stable, mild to moderate generalized parenchymal atrophy. As before, there is advanced ill-defined hypoattenuation within the cerebral white matter which is  nonspecific, but consistent with chronic small vessel ischemic disease. Redemonstrated remote lacunar infarcts within the bilateral thalami and right basal ganglia/hemispheric cerebral white matter. Known small chronic infarcts within the cerebellar hemispheres. There is no acute intracranial hemorrhage. No acute demarcated cortical infarct is identified. No extra-axial fluid collection. No evidence of intracranial mass. No midline shift. Vascular: No definite hyperdense vessel. Atherosclerotic calcifications Skull: Normal. Negative for fracture or focal lesion. Sinuses/Orbits: Visualized orbits show no acute finding. Bilateral lens replacements. Opacification of the partially imaged left maxillary sinus. No significant mastoid effusion. ASPECTS [facility] Stroke Program Early CT Score) - Ganglionic level infarction (caudate, lentiform nuclei, internal capsule, insula, M1-M3 cortex): 7 - Supraganglionic infarction (M4-M6 cortex): 3 Total score (0-10 with 10 being normal): 10 (when discounting chronic infarcts). These results were called by telephone at the time of interpretation on 05/03/2020 at 7:18 pm to provider MARK QUALE , who verbally acknowledged these results. IMPRESSION: No CT evidence of acute intracranial abnormality. APSECTS is 10 (when discounting chronic infarcts). Stable generalized parenchymal atrophy and advanced cerebral white matter chronic small vessel ischemic disease. Known chronic infarcts within the thalami and right basal ganglia/hemispheric cerebral white matter as well as cerebellum. Left maxillary sinusitis, incompletely imaged. Electronically Signed   By: Kellie Simmering DO   On: 05/03/2020 19:23    ECHOCARDIOGRAM IMPRESSIONS    1. Left ventricular ejection fraction, by estimation, is 65 to 70%. The  left ventricle has normal function. The left ventricle has no regional  wall motion abnormalities. Left ventricular diastolic parameters are  consistent with Grade I diastolic   dysfunction (impaired relaxation).  2. Right ventricular systolic function is normal. The right ventricular  size is normal. There is normal pulmonary artery systolic pressure.  3. The mitral valve is normal  in structure. No evidence of mitral valve  regurgitation.  4. The aortic valve is tricuspid. Aortic valve regurgitation is mild.  Mild aortic valve sclerosis is present, with no evidence of aortic valve  stenosis.  5. The inferior vena cava is normal in size with greater than 50%  respiratory variability, suggesting right atrial pressure of 3 mmHg.   FINDINGS  Left Ventricle: Left ventricular ejection fraction, by estimation, is 65  to 70%. The left ventricle has normal function. The left ventricle has no  regional wall motion abnormalities. The left ventricular internal cavity  size was normal in size. There is  no left ventricular hypertrophy. Left ventricular diastolic parameters  are consistent with Grade I diastolic dysfunction (impaired relaxation).   Right Ventricle: The right ventricular size is normal. No increase in  right ventricular wall thickness. Right ventricular systolic function is  normal. There is normal pulmonary artery systolic pressure. The tricuspid  regurgitant velocity is 2.44 m/s, and  with an assumed right atrial pressure of 3 mmHg, the estimated right  ventricular systolic pressure is 15.4 mmHg.   Left Atrium: Left atrial size was normal in size.   Right Atrium: Right atrial size was normal in size.   Pericardium: There is no evidence of pericardial effusion.   Mitral Valve: The mitral valve is normal in structure. No evidence of  mitral valve regurgitation.   Tricuspid Valve: The tricuspid valve is grossly normal. Tricuspid valve  regurgitation is not demonstrated.   Aortic Valve: The aortic valve is tricuspid. Aortic valve regurgitation is  mild. Mild aortic valve sclerosis is present, with no evidence of aortic  valve stenosis.  Aortic valve mean gradient measures 4.0 mmHg. Aortic valve  peak gradient measures 6.2  mmHg. Aortic valve area, by VTI measures 3.91 cm.   Pulmonic Valve: The pulmonic valve was not well visualized. Pulmonic valve  regurgitation is not visualized.   Aorta: The aortic root is normal in size and structure.   Venous: The inferior vena cava is normal in size with greater than 50%  respiratory variability, suggesting right atrial pressure of 3 mmHg.   IAS/Shunts: No atrial level shunt detected by color flow Doppler.     LEFT VENTRICLE  PLAX 2D  LVIDd:     3.66 cm Diastology  LVIDs:     2.11 cm LV e' medial:  3.05 cm/s  LV PW:     1.21 cm LV E/e' medial: 20.5  LV IVS:    0.71 cm LV e' lateral:  8.59 cm/s  LVOT diam:   2.00 cm LV E/e' lateral: 7.3  LV SV:     88  LV SV Index:  65  LVOT Area:   3.14 cm     RIGHT VENTRICLE  RV Basal diam: 2.26 cm  RV S prime:   13.60 cm/s  TAPSE (M-mode): 2.8 cm   LEFT ATRIUM      Index    RIGHT ATRIUM      Index  LA diam:   3.10 cm 2.30 cm/m RA Area:   14.90 cm  LA Vol (A2C): 28.1 ml 20.89 ml/m RA Volume:  36.30 ml 26.98 ml/m  LA Vol (A4C): 31.1 ml 23.12 ml/m  AORTIC VALVE          PULMONIC VALVE  AV Area (Vmax):  4.17 cm  PV Vmax:    0.78 m/s  AV Area (Vmean):  3.35 cm  PV Peak grad:  2.4 mmHg  AV Area (VTI):   3.91 cm  RVOT Peak grad: 2 mmHg  AV Vmax:      125.00 cm/s  AV Vmean:     91.400 cm/s  AV VTI:      0.224 m  AV Peak Grad:   6.2 mmHg  AV Mean Grad:   4.0 mmHg  LVOT Vmax:     166.00 cm/s  LVOT Vmean:    97.400 cm/s  LVOT VTI:     0.279 m  LVOT/AV VTI ratio: 1.25    AORTA  Ao Root diam: 2.90 cm   MITRAL VALVE        TRICUSPID VALVE  MV Area (PHT): 2.19 cm   TR Peak grad:  23.8 mmHg  MV Decel Time: 347 msec   TR Vmax:    244.00 cm/s  MV E velocity: 62.60 cm/s  MV A velocity:  115.00 cm/s SHUNTS  MV E/A ratio: 0.54     Systemic VTI: 0.28 m               Systemic Diam: 2.00 cm    Subjective: And examined at bedside and she is back to baseline.  Was able to express without issues.  No chest pain, lightheadedness or dizziness.  No nausea or vomiting.  No other concerns or complaints at this time and she is stable for discharge and will need to follow-up with PCP as well as cardiology in outpatient setting and she is understandable agree with the plan of care.  Discharge Exam: Vitals:   05/04/20 0728 05/04/20 1139  BP: (!) 190/89 (!) 155/93  Pulse: 80 98  Resp: 18 17  Temp: 98 F (36.7 C) 98 F (36.7 C)  SpO2: 98% 92%   Vitals:   05/04/20 0204 05/04/20 0422 05/04/20 0728 05/04/20 1139  BP: (!) 181/100 (!) 184/112 (!) 190/89 (!) 155/93  Pulse: 94 87 80 98  Resp: $Remo'19 17 18 17  'ROnmp$ Temp: 98.2 F (36.8 C) 98.2 F (36.8 C) 98 F (36.7 C) 98 F (36.7 C)  TempSrc: Oral Oral Oral Oral  SpO2: 91% 95% 98% 92%  Weight:      Height:       General: Pt is alert, awake, not in acute distress Cardiovascular: RRR, S1/S2 +, no rubs, no gallops Respiratory: Diminished bilaterally, no wheezing, no rhonchi Abdominal: Soft, NT, ND, bowel sounds + Extremities: no edema, no cyanosis  The results of significant diagnostics from this hospitalization (including imaging, microbiology, ancillary and laboratory) are listed below for reference.    Microbiology: Recent Results (from the past 240 hour(s))  SARS Coronavirus 2 by RT PCR (hospital order, performed in [facility] hospital lab) Nasopharyngeal Nasopharyngeal Swab     Status: None   Collection Time: 05/03/20  9:43 PM   Specimen: Nasopharyngeal Swab  Result Value Ref Range Status   SARS Coronavirus 2 NEGATIVE NEGATIVE Final    Comment: (NOTE) SARS-CoV-2 target nucleic acids are NOT DETECTED.  The SARS-CoV-2 RNA is generally detectable in upper and lower respiratory specimens during the acute phase  of infection. The lowest concentration of SARS-CoV-2 viral copies this assay can detect is 250 copies / mL. A negative result does not preclude SARS-CoV-2 infection and should not be used as the sole basis for treatment or other patient management decisions.  A negative result may occur with improper specimen collection / handling, submission of specimen other than nasopharyngeal swab, presence of viral mutation(s) within the areas targeted by this assay, and inadequate number of viral copies (<250 copies / mL). A negative result  must be combined with clinical observations, patient history, and epidemiological information.  Fact Sheet for Patients:   StrictlyIdeas.no  Fact Sheet for Healthcare Providers: BankingDealers.co.za  This test is not yet approved or  cleared by the [place] FDA and has been authorized for detection and/or diagnosis of SARS-CoV-2 by FDA under an Emergency Use Authorization (EUA).  This EUA will remain in effect (meaning this test can be used) for the duration of the COVID-19 declaration under Section 564(b)(1) of the Act, 21 U.S.C. section 360bbb-3(b)(1), unless the authorization is terminated or revoked sooner.  Performed at [facility], [address] [place], [place] [address]     Labs: BNP (last 3 results) No results for input(s): BNP in the last 8760 hours. Basic Metabolic Panel: Recent Labs  Lab 05/03/20 1912 05/04/20 0424  NA 138 138  K 3.8 3.3*  CL 98 100  CO2 23 24  GLUCOSE 116* 111*  BUN 11 9  CREATININE 0.80 0.74  CALCIUM 10.6* 9.5   Liver Function Tests: Recent Labs  Lab 05/03/20 1912  AST 21  ALT 13  ALKPHOS 67  BILITOT 1.2  PROT 8.0  ALBUMIN 5.2*   No results for input(s): LIPASE, AMYLASE in the last 168 hours. No results for input(s): AMMONIA in the last 168 hours. CBC: Recent Labs  Lab 05/03/20 1912 05/04/20 0424  WBC 12.2* 12.4*  NEUTROABS 10.0*  --    HGB 14.7 13.6  HCT 43.7 40.9  MCV 88.1 89.5  PLT 291 247   Cardiac Enzymes: No results for input(s): CKTOTAL, CKMB, CKMBINDEX, TROPONINI in the last 168 hours. BNP: Invalid input(s): POCBNP CBG: Recent Labs  Lab 05/03/20 1911  GLUCAP 120*   D-Dimer No results for input(s): DDIMER in the last 72 hours. Hgb A1c No results for input(s): HGBA1C in the last 72 hours. Lipid Profile Recent Labs    05/04/20 0424  CHOL 234*  HDL 76  LDLCALC 135*  TRIG 113  CHOLHDL 3.1   Thyroid function studies No results for input(s): TSH, T4TOTAL, T3FREE, THYROIDAB in the last 72 hours.  Invalid input(s): FREET3 Anemia work up No results for input(s): VITAMINB12, FOLATE, FERRITIN, TIBC, IRON, RETICCTPCT in the last 72 hours. Urinalysis    Component Value Date/Time   COLORURINE STRAW (A) 11/04/2017 2039   APPEARANCEUR CLEAR (A) 11/04/2017 2039   APPEARANCEUR Clear 07/08/2014 2155   LABSPEC 1.014 11/04/2017 2039   LABSPEC 1.003 07/08/2014 2155   PHURINE 7.0 11/04/2017 2039   GLUCOSEU NEGATIVE 11/04/2017 2039   GLUCOSEU Negative 07/08/2014 2155   HGBUR MODERATE (A) 11/04/2017 2039   BILIRUBINUR NEGATIVE 11/04/2017 2039   BILIRUBINUR Negative 07/08/2014 2155   KETONESUR NEGATIVE 11/04/2017 2039   PROTEINUR NEGATIVE 11/04/2017 2039   NITRITE NEGATIVE 11/04/2017 2039   LEUKOCYTESUR TRACE (A) 11/04/2017 2039   LEUKOCYTESUR Negative 07/08/2014 2155   Sepsis Labs Invalid input(s): PROCALCITONIN,  WBC,  LACTICIDVEN Microbiology Recent Results (from the past 240 hour(s))  SARS Coronavirus 2 by RT PCR (hospital order, performed in [place] hospital lab) Nasopharyngeal Nasopharyngeal Swab     Status: None   Collection Time: 05/03/20  9:43 PM   Specimen: Nasopharyngeal Swab  Result Value Ref Range Status   SARS Coronavirus 2 NEGATIVE NEGATIVE Final    Comment: (NOTE) SARS-CoV-2 target nucleic acids are NOT DETECTED.  The SARS-CoV-2 RNA is generally detectable in upper and  lower respiratory specimens during the acute phase of infection. The lowest concentration of SARS-CoV-2 viral copies this assay can detect  is 250 copies / mL. A negative result does not preclude SARS-CoV-2 infection and should not be used as the sole basis for treatment or other patient management decisions.  A negative result may occur with improper specimen collection / handling, submission of specimen other than nasopharyngeal swab, presence of viral mutation(s) within the areas targeted by this assay, and inadequate number of viral copies (<250 copies / mL). A negative result must be combined with clinical observations, patient history, and epidemiological information.  Fact Sheet for Patients:   StrictlyIdeas.no  Fact Sheet for Healthcare Providers: BankingDealers.co.za  This test is not yet approved or  cleared by the [place] FDA and has been authorized for detection and/or diagnosis of SARS-CoV-2 by FDA under an Emergency Use Authorization (EUA).  This EUA will remain in effect (meaning this test can be used) for the duration of the COVID-19 declaration under Section 564(b)(1) of the Act, 21 U.S.C. section 360bbb-3(b)(1), unless the authorization is terminated or revoked sooner.  Performed at [facility], [address], [place], [place] [address]    Time coordinating discharge: 35 minutes  SIGNED:  Kerney Elbe, DO [facility] 05/04/2020, 12:55 PM Pager is on [place]  If 7PM-7AM, please contact night-coverage [email or web address]

## 2020-05-04 NOTE — Evaluation (Signed)
Physical Therapy Evaluation Patient Details Name: DELPHIA KAYLOR MRN: [id number] DOB: Jun 30, 1931 Today's Date: 05/04/2020   History of Present Illness  Perrin Eddleman Odor is a 86 y.o. female with medical history significant for stroke-like episode versus seizure (s/p tPA 04/03/2018), HTN, HLD, hyperparathyroidism/hypercalcemia (s/p left inferior parathyroidectomy 03/27/2019), and lumbar DDD who presents to the ED for evaluation of recurrent and worsened expressive aphasia. Immaging negative for acute abnormality. 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  Clinical Impression  Upon evaluation, patient alert and oriented; follows commands, and participates well with session.  Effectively communicates basic wants and needs, but does demonstrates intermittent word-finding difficulties within conversation (able to use strategies to compensate).  Bilat UE/LE strength and ROM grossly symmetrical and WFL; no focal weakness, sensory or coordination deficit appreciated.  Able to complete sit/stand, basic transfers and gait (200') with RW, mod indep.  Demonstrates reciprocal stepping pattern with fair/good cadence; mild narrowing of BOS with mild toe-in (R > L) at times; mild sway with head turns, but self-corrects with LE step strategy.  Feels gait performance is at/near baseline.  Fair/good standing balance noted throughout session; good functional reach, good awareness of limits of stability, good use of compensatory strategies as needed. Appears to be at baseline level of functional ability without indication for acute PT services.  Will complete initial PT order at this time; please re-consult should needs change.     Follow Up Recommendations No PT follow up    Equipment Recommendations       Recommendations for Other Services       Precautions / Restrictions Precautions Precautions: Fall;None Restrictions Weight Bearing Restrictions:  No      Mobility  Bed Mobility               General bed mobility comments: seated in recliner beginning/end of treatment session  Transfers Overall transfer level: Modified independent Equipment used: Rolling walker (2 wheeled)             General transfer comment: min cuing for hand placement to prevent pulling on RW  Ambulation/Gait Ambulation/Gait assistance: Modified independent (Device/Increase time) Gait Distance (Feet): 200 Feet Assistive device: Rolling walker (2 wheeled)       General Gait Details: reciprocal stepping pattern with fair/good cadence; mild narrowing of BOS with mild toe-in (R > L) at times; mild sway with head turns, but self-corrects with LE step strategy.  Feels gait performance is at/near baseline  Stairs            Wheelchair Mobility    Modified Rankin (Stroke Patients Only)       Balance Overall balance assessment: Modified Independent Sitting-balance support: No upper extremity supported Sitting balance-Leahy Scale: Normal     Standing balance support: Bilateral upper extremity supported;No upper extremity supported Standing balance-Leahy Scale: Fair                 High Level Balance Comments: standing functional reach at least 6" from immediate BOS; good awareness of limits of stability, good use of compensatory strategies as needed             Pertinent Vitals/Pain Pain Assessment: No/denies pain    Home Living Family/patient expects to be discharged to:: Private residence (Simultaneous filing. User may not have seen previous data.) Living Arrangements: Alone (Simultaneous filing. User may not have seen previous data.) Available Help at Discharge: Family;Available PRN/intermittently (Daughter  Simultaneous filing. User may not have seen  previous data.) Type of Home: House (Simultaneous filing. User may not have seen previous data.) Home Access: Stairs to enter (Simultaneous filing. User may not have  seen previous data.) Entrance Stairs-Rails: None (Simultaneous filing. User may not have seen previous data.) Entrance Stairs-Number of Steps: 1 (Simultaneous filing. User may not have seen previous data.) Home Layout: One level (Simultaneous filing. User may not have seen previous data.) Home Equipment: Walker - 2 wheels;Walker - 4 wheels;Grab bars - tub/shower;Hand held shower head;Shower seat (Simultaneous filing. User may not have seen previous data.)      Prior Function Level of Independence: Independent with assistive device(s) (Simultaneous filing. User may not have seen previous data.)         Comments: Pt states that she likes to "Keep my rollator close" on occasion will use walls to support herself. Denies falls history in last year. Performs ADL management independently with devices including shower chair for bathing. (Simultaneous filing. User may not have seen previous data.)     Hand Dominance   Dominant Hand: Right (Simultaneous filing. User may not have seen previous data.)    Extremity/Trunk Assessment   Upper Extremity Assessment Upper Extremity Assessment: Overall WFL for tasks assessed (grossly 4+ to 5/5 throughout; no focal weakness, sensory or coordination deficit)    Lower Extremity Assessment Lower Extremity Assessment: Overall WFL for tasks assessed (grossly 4+ to 5/5 throughout; no focal weakness, sensory or coordination deficit)       Communication   Communication: HOH;Expressive difficulties (Simultaneous filing. User may not have seen previous data.)  Cognition Arousal/Alertness: Awake/alert Behavior During Therapy: WFL for tasks assessed/performed Overall Cognitive Status: Within Functional Limits for tasks assessed                                        General Comments      Exercises Other Exercises Other Exercises: Sit/stand from various surfaces in room (recliner, standard toilet, edge of bed), mod indep with and without  assist device.  Does require use of UEs for optimal safety. Other Exercises: Donning shoes edge of bed, indep; good sitting balance, functional reach noted Other Exercises: Toilet transfer, ambulatory with RW, mod indep; sit/stand from standard toilet, mod indep wiht grab bar; standing balance at sink for hand hygiene, mod indep   Assessment/Plan    PT Assessment Patent does not need any further PT services  PT Problem List Decreased balance;Decreased mobility;Decreased activity tolerance       PT Treatment Interventions Gait training;Functional mobility training;Therapeutic activities;Therapeutic exercise;Balance training;Neuromuscular re-education;Cognitive remediation;Patient/family education    PT Goals (Current goals can be found in the Care Plan section)  Acute Rehab PT Goals Patient Stated Goal: to get back home, hopefully today PT Goal Formulation: All assessment and education complete, DC therapy Time For Goal Achievement: 05/04/20 Potential to Achieve Goals: Good    Frequency     Barriers to discharge        Co-evaluation               AM-PAC PT "6 Clicks" Mobility  Outcome Measure Help needed turning from your back to your side while in a flat bed without using bedrails?: None Help needed moving from lying on your back to sitting on the side of a flat bed without using bedrails?: None Help needed moving to and from a bed to a chair (including a wheelchair)?: None Help needed standing up  from a chair using your arms (e.g., wheelchair or bedside chair)?: None Help needed to walk in hospital room?: None Help needed climbing 3-5 steps with a railing? : None 6 Click Score: 24    End of Session   Activity Tolerance: Patient tolerated treatment well Patient left: in chair;with call bell/phone within reach;with chair alarm set Nurse Communication: Mobility status PT Visit Diagnosis: Other symptoms and signs involving the nervous system (R29.898)    Time:  0950-1010 PT Time Calculation (min) (ACUTE ONLY): 20 min   Charges:   PT Evaluation $PT Eval Moderate Complexity: 1 Mod PT Treatments $Therapeutic Activity: 8-22 mins        Erlinda Solinger H. Owens Shark, PT, DPT, NCS 05/04/20, 10:28 AM [phone]

## 2020-05-04 NOTE — Consult Note (Signed)
Reason for Consult: aphasia  Requesting Physician: Dr. Alfredia Ferguson  CC: aphasia    HPI: Kellie Harris is an 86 y.o. female female with medical history significant for stroke-like episode versus seizure (s/p tPA 04/03/2018), HTN, HLD, hyperparathyroidism/hypercalcemia presents for evaluation of recurrent and worsened expressive aphasia that was transient and resolved.  Prior episode 2 years ago associated with syncope. Pt was on Keppra for a period of time and was d/c. She does follow with Dr. Melrose Nakayama.    Past Medical History:  Diagnosis Date   Arthritis    right knee, left shoulder/ OSTEO   Cancer ([place])    skin    Degenerative disc disease, lumbar    L4-L5   Diverticulosis    ITIS   History of hiatal hernia    Hydronephrosis    right   Hypercholesteremia    Hypertension    CONTROLLED ON MEDS   IBS (irritable bowel syndrome)    Knee fracture, right 03/05/2015   DIFFICULTY WITH AMBULATION   Osteopenia    Ovarian cyst    Pancreatic insufficiency    Wears hearing aid    bilateral    Past Surgical History:  Procedure Laterality Date   CATARACT EXTRACTION W/PHACO Right 11/02/2015   Procedure: CATARACT EXTRACTION PHACO AND INTRAOCULAR LENS PLACEMENT ([place]);  Surgeon: Leandrew Koyanagi, MD;  Location: [place];  Service: Ophthalmology;  Laterality: Right;   CATARACT EXTRACTION W/PHACO Left 07/18/2016   Procedure: CATARACT EXTRACTION PHACO AND INTRAOCULAR LENS PLACEMENT ([facility]);  Surgeon: Leandrew Koyanagi, MD;  Location: [place];  Service: Ophthalmology;  Laterality: Left;   COLONOSCOPY     FRACTURE SURGERY Left    hip   history of left hip fracture     INTRAMEDULLARY (IM) NAIL INTERTROCHANTERIC Left 02/12/2017   Procedure: INTRAMEDULLARY (IM) NAIL INTERTROCHANTRIC;  Surgeon: Earnestine Leys, MD;  Location: [facility];  Service: Orthopedics;  Laterality: Left;   kidney stent     OOPHORECTOMY Bilateral 2015   ORIF WRIST FRACTURE Right 04/08/2018    Procedure: OPEN REDUCTION INTERNAL FIXATION (ORIF) WRIST FRACTURE;  Surgeon: Altamese Gibson, MD;  Location: [place];  Service: Orthopedics;  Laterality: Right;   TONSILLECTOMY     TOTAL KNEE ARTHROPLASTY Right 08/07/2017   Procedure: TOTAL KNEE ARTHROPLASTY;  Surgeon: Earnestine Leys, MD;  Location: [facility];  Service: Orthopedics;  Laterality: Right;    Family History  Problem Relation Age of Onset   Breast cancer Mother 20   Leukemia Mother    Stroke Father     Social History:  reports that she has never smoked. She has never used smokeless tobacco. She reports that she does not drink alcohol and does not use drugs.  No Known Allergies  Medications: I have reviewed the patient's current medications.  ROS: History obtained from the patient  General ROS: negative for - chills, fatigue, fever, night sweats, weight gain or weight loss Psychological ROS: negative for - behavioral disorder, hallucinations, memory difficulties, mood swings or suicidal ideation Ophthalmic ROS: negative for - blurry vision, double vision, eye pain or loss of vision ENT ROS: negative for - epistaxis, nasal discharge, oral lesions, sore throat, tinnitus or vertigo Allergy and Immunology ROS: negative for - hives or itchy/watery eyes Hematological and Lymphatic ROS: negative for - bleeding problems, bruising or swollen lymph nodes Endocrine ROS: negative for - galactorrhea, hair pattern changes, polydipsia/polyuria or temperature intolerance Respiratory ROS: negative for - cough, hemoptysis, shortness of breath or wheezing Cardiovascular ROS: negative for - chest pain, dyspnea  on exertion, edema or irregular heartbeat Gastrointestinal ROS: negative for - abdominal pain, diarrhea, hematemesis, nausea/vomiting or stool incontinence Genito-Urinary ROS: negative for - dysuria, hematuria, incontinence or urinary frequency/urgency Musculoskeletal ROS: negative for - joint swelling or muscular  weakness Neurological ROS: as noted in HPI Dermatological ROS: negative for rash and skin lesion changes  Physical Examination: Blood pressure (!) 190/89, pulse 80, temperature 98 F (36.7 C), temperature source Oral, resp. rate 18, height 4\' 10"  (1.473 m), weight 44.5 kg, SpO2 98 %.   Neurological Examination   Mental Status: Alert, oriented, thought content appropriate.  Speech fluent without evidence of aphasia.  Able to follow 3 step commands without difficulty. Cranial Nerves: II: Discs flat bilaterally; Visual fields grossly normal, pupils equal, round, reactive to light and accommodation III,IV, VI: ptosis not present, extra-ocular motions intact bilaterally V,VII: smile symmetric, facial light touch sensation normal bilaterally VIII: hearing normal bilaterally IX,X: gag reflex present XI: bilateral shoulder shrug XII: midline tongue extension Motor: Generalized weakness more so in lower extremities Tone and bulk:normal tone throughout; no atrophy noted Sensory: Pinprick and light touch intact throughout, bilaterally    Laboratory Studies:   Basic Metabolic Panel: Recent Labs  Lab 05/03/20 1912 05/04/20 0424  NA 138 138  K 3.8 3.3*  CL 98 100  CO2 23 24  GLUCOSE 116* 111*  BUN 11 9  CREATININE 0.80 0.74  CALCIUM 10.6* 9.5    Liver Function Tests: Recent Labs  Lab 05/03/20 1912  AST 21  ALT 13  ALKPHOS 67  BILITOT 1.2  PROT 8.0  ALBUMIN 5.2*   No results for input(s): LIPASE, AMYLASE in the last 168 hours. No results for input(s): AMMONIA in the last 168 hours.  CBC: Recent Labs  Lab 05/03/20 1912 05/04/20 0424  WBC 12.2* 12.4*  NEUTROABS 10.0*  --   HGB 14.7 13.6  HCT 43.7 40.9  MCV 88.1 89.5  PLT 291 247    Cardiac Enzymes: No results for input(s): CKTOTAL, CKMB, CKMBINDEX, TROPONINI in the last 168 hours.  BNP: Invalid input(s): POCBNP  CBG: Recent Labs  Lab 05/03/20 1911  GLUCAP 120*    Microbiology: Results for orders  placed or performed during the hospital encounter of 05/03/20  SARS Coronavirus 2 by RT PCR (hospital order, performed in [facility] hospital lab) Nasopharyngeal Nasopharyngeal Swab     Status: None   Collection Time: 05/03/20  9:43 PM   Specimen: Nasopharyngeal Swab  Result Value Ref Range Status   SARS Coronavirus 2 NEGATIVE NEGATIVE Final    Comment: (NOTE) SARS-CoV-2 target nucleic acids are NOT DETECTED.  The SARS-CoV-2 RNA is generally detectable in upper and lower respiratory specimens during the acute phase of infection. The lowest concentration of SARS-CoV-2 viral copies this assay can detect is 250 copies / mL. A negative result does not preclude SARS-CoV-2 infection and should not be used as the sole basis for treatment or other patient management decisions.  A negative result may occur with improper specimen collection / handling, submission of specimen other than nasopharyngeal swab, presence of viral mutation(s) within the areas targeted by this assay, and inadequate number of viral copies (<250 copies / mL). A negative result must be combined with clinical observations, patient history, and epidemiological information.  Fact Sheet for Patients:   StrictlyIdeas.no  Fact Sheet for Healthcare Providers: BankingDealers.co.za  This test is not yet approved or  cleared by the [place] FDA and has been authorized for detection and/or diagnosis of SARS-CoV-2 by FDA  under an Emergency Use Authorization (EUA).  This EUA will remain in effect (meaning this test can be used) for the duration of the COVID-19 declaration under Section 564(b)(1) of the Act, 21 U.S.C. section 360bbb-3(b)(1), unless the authorization is terminated or revoked sooner.  Performed at [facility], [address] [place], [place] [address]     Coagulation Studies: Recent Labs    05/03/20 1912  LABPROT 12.1  INR 0.9    Urinalysis:  No results for input(s): COLORURINE, LABSPEC, PHURINE, GLUCOSEU, HGBUR, BILIRUBINUR, KETONESUR, PROTEINUR, UROBILINOGEN, NITRITE, LEUKOCYTESUR in the last 168 hours.  Invalid input(s): APPERANCEUR  Lipid Panel:     Component Value Date/Time   CHOL 234 (H) 05/04/2020 0424   TRIG 113 05/04/2020 0424   HDL 76 05/04/2020 0424   CHOLHDL 3.1 05/04/2020 0424   VLDL 23 05/04/2020 0424   LDLCALC 135 (H) 05/04/2020 0424    HgbA1C:  Lab Results  Component Value Date   HGBA1C 4.7 (L) 04/04/2018    Urine Drug Screen:      Component Value Date/Time   LABOPIA NONE DETECTED 11/04/2017 2039   COCAINSCRNUR NONE DETECTED 11/04/2017 2039   LABBENZ POSITIVE (A) 11/04/2017 2039   AMPHETMU NONE DETECTED 11/04/2017 2039   THCU NONE DETECTED 11/04/2017 2039   LABBARB NONE DETECTED 11/04/2017 2039    Alcohol Level:  Recent Labs  Lab 05/03/20 1912  ETH <10    Other results: EKG: normal EKG, normal sinus rhythm, unchanged from previous tracings.  Imaging: MR BRAIN WO CONTRAST  Result Date: 05/04/2020 CLINICAL DATA:  Initial evaluation for neuro deficit, stroke suspected. EXAM: MRI HEAD WITHOUT CONTRAST TECHNIQUE: Multiplanar, multiecho pulse sequences of the brain and surrounding structures were obtained without intravenous contrast. COMPARISON:  Prior CT from 05/03/2020. FINDINGS: Brain: Diffuse prominence of the CSF containing spaces compatible with generalized age-related cerebral atrophy. Patchy and confluent T2/FLAIR hyperintensity within the periventricular deep white matter both cerebral hemispheres most consistent with chronic small vessel ischemic disease, moderate in nature. Multiple scattered remote lacunar infarcts present about the hemispheric cerebral white matter, bilateral thalami, and right greater than left cerebellum. Small area of encephalomalacia and gliosis involving the left occipital lobe compatible with a chronic left PCA territory infarct. No abnormal foci of restricted  diffusion to suggest acute or subacute ischemia. Gray-white matter differentiation maintained. No other areas of encephalomalacia to suggest chronic cortical infarction. No foci of susceptibility artifact to suggest acute or chronic intracranial hemorrhage. No mass lesion, midline shift or mass effect. Diffuse ventricular prominence related to global parenchymal volume loss without hydrocephalus. No extra-axial fluid collection. Pituitary gland suprasellar region within normal limits. Midline structures intact. Vascular: Major intracranial vascular flow voids are maintained. Skull and upper cervical spine: Craniocervical junction within normal limits. Bone marrow signal intensity normal. No scalp soft tissue abnormality. Sinuses/Orbits: Patient status post bilateral ocular lens replacement. Globes and orbital soft tissues demonstrate no acute finding. Chronic left maxillary sinus disease noted. Paranasal sinuses are otherwise largely clear. No mastoid effusion. Other: None. IMPRESSION: 1. No acute intracranial abnormality. 2. Chronic left PCA territory infarct, with multiple additional remote lacunar infarcts involving the hemispheric cerebral white matter, bilateral thalami, and right greater than left cerebellum. 3. Underlying age-related cerebral atrophy with moderate chronic small vessel ischemic disease. Electronically Signed   By: Jeannine Boga M.D.   On: 05/04/2020 00:18   US Carotid Bilateral (at [facility] and [facility] only)  Result Date: 05/04/2020 CLINICAL DATA:  Expressive aphasia, hypertension, stroke, blurred vision EXAM: BILATERAL CAROTID  DUPLEX ULTRASOUND TECHNIQUE: Pearline Cables scale imaging, color Doppler and duplex ultrasound were performed of bilateral carotid and vertebral arteries in the neck. COMPARISON:  11/05/2017 FINDINGS: Criteria: Quantification of carotid stenosis is based on velocity parameters that correlate the residual internal carotid diameter with NASCET-based stenosis levels, using the  diameter of the distal internal carotid lumen as the denominator for stenosis measurement. The following velocity measurements were obtained: RIGHT ICA: 49/21 cm/sec CCA: 96/22 cm/sec SYSTOLIC ICA/CCA RATIO:  0.7 ECA: 66 cm/sec LEFT ICA: 99/33 cm/sec CCA: 29/79 cm/sec SYSTOLIC ICA/CCA RATIO:  2.0 ECA: 84 cm/sec RIGHT CAROTID ARTERY: Minor echogenic shadowing plaque formation. No hemodynamically significant right ICA stenosis, velocity elevation, or turbulent flow. Degree of narrowing less than 50%. RIGHT VERTEBRAL ARTERY:  Normal antegrade flow LEFT CAROTID ARTERY: Similar scattered minor echogenic plaque formation. No hemodynamically significant left ICA stenosis, velocity elevation, or turbulent flow. LEFT VERTEBRAL ARTERY:  Normal antegrade flow IMPRESSION: Minor carotid atherosclerosis. No hemodynamically significant ICA stenosis. Degree of narrowing less than 50% bilaterally by ultrasound criteria. No significant interval change compared 2019. Patent antegrade vertebral flow bilaterally Electronically Signed   By: Jerilynn Mages.  Shick M.D.   On: 05/04/2020 07:53   CT HEAD CODE STROKE WO CONTRAST  Result Date: 05/03/2020 CLINICAL DATA:  Code stroke. Neuro deficit, acute, stroke suspected. Aphasia onset 1730. EXAM: CT HEAD WITHOUT CONTRAST TECHNIQUE: Contiguous axial images were obtained from the base of the skull through the vertex without intravenous contrast. COMPARISON:  Brain MRI 04/04/2018.  Noncontrast head CT 04/03/2018. FINDINGS: Brain: Stable, mild to moderate generalized parenchymal atrophy. As before, there is advanced ill-defined hypoattenuation within the cerebral white matter which is nonspecific, but consistent with chronic small vessel ischemic disease. Redemonstrated remote lacunar infarcts within the bilateral thalami and right basal ganglia/hemispheric cerebral white matter. Known small chronic infarcts within the cerebellar hemispheres. There is no acute intracranial hemorrhage. No acute demarcated  cortical infarct is identified. No extra-axial fluid collection. No evidence of intracranial mass. No midline shift. Vascular: No definite hyperdense vessel. Atherosclerotic calcifications Skull: Normal. Negative for fracture or focal lesion. Sinuses/Orbits: Visualized orbits show no acute finding. Bilateral lens replacements. Opacification of the partially imaged left maxillary sinus. No significant mastoid effusion. ASPECTS [facility] Stroke Program Early CT Score) - Ganglionic level infarction (caudate, lentiform nuclei, internal capsule, insula, M1-M3 cortex): 7 - Supraganglionic infarction (M4-M6 cortex): 3 Total score (0-10 with 10 being normal): 10 (when discounting chronic infarcts). These results were called by telephone at the time of interpretation on 05/03/2020 at 7:18 pm to provider MARK QUALE , who verbally acknowledged these results. IMPRESSION: No CT evidence of acute intracranial abnormality. APSECTS is 10 (when discounting chronic infarcts). Stable generalized parenchymal atrophy and advanced cerebral white matter chronic small vessel ischemic disease. Known chronic infarcts within the thalami and right basal ganglia/hemispheric cerebral white matter as well as cerebellum. Left maxillary sinusitis, incompletely imaged. Electronically Signed   By: Kellie Simmering DO   On: 05/03/2020 19:23     Assessment/Plan:  86 y.o. female female with medical history significant for stroke-like episode versus seizure (s/p tPA 04/03/2018), HTN, HLD, hyperparathyroidism/hypercalcemia presents for evaluation of recurrent and worsened expressive aphasia that was transient and resolved.  Prior episode 2 years ago associated with syncope. Pt was on Keppra for a period of time and was d/c. She does follow with Dr. Melrose Nakayama.     - MRI reviewed with chronic PCA stroke and nothing else acute - Don't think this is seizure related, she has had multiple EEGs,  was on Keppra and is followed up by Dr. Melrose Nakayama - last time 2  years ago assicated with syncope where she was placed on cardiac monitoring but it was short term - ? Consideration of implantable recorder vs again holter type of monitoring - I don't think there is a need for EEG or anti epileptics at this time given her current condition and close to baseline - d/c from Neurological perspective with cardiology and Neurology follow up as out patient 05/04/2020, 10:40 AM

## 2020-05-04 NOTE — Evaluation (Signed)
Occupational Therapy Evaluation Patient Details Name: Kellie Harris MRN: [id number] DOB: June 13, 1931 Today's Date: 05/04/2020    History of Present Illness Kellie Harris is a 86 y.o. female with medical history significant for stroke-like episode versus seizure (s/p tPA 04/03/2018), HTN, HLD, hyperparathyroidism/hypercalcemia (s/p left inferior parathyroidectomy 03/27/2019), and lumbar DDD who presents to the ED for evaluation of recurrent and worsened expressive aphasia. Immaging negative for acute abnormality. 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   Clinical Impression   [name] was seen for OT evaluation this date. Prior to hospital admission, pt was modified independent for BADL management, using a 2WW for functional mobility within the home. Pt lives alone in a one level home with 1 step to enter. Currently pt demonstrates impairments as described below (See OT problem list) which functionally limit her ability to perform ADL/self-care tasks. She presents near baseline functional independence for functional mobility and toileting this date, however requires consistent cueing and supervision for safety.  Pt would benefit from skilled OT services to address noted impairments and functional limitations (see below for any additional details) in order to maximize safety and independence while minimizing falls risk and caregiver burden. Do not anticipate need for follow-up OT services upon hospital DC.     Follow Up Recommendations  No OT follow up    Equipment Recommendations  3 in 1 bedside commode    Recommendations for Other Services       Precautions / Restrictions Precautions Precautions: Fall;None Restrictions Weight Bearing Restrictions: No      Mobility Bed Mobility Overal bed mobility: Needs Assistance Bed Mobility: Supine to Sit     Supine to sit: Modified independent (Device/Increase time)      General bed mobility comments: SBA for mgt of lines/leads  Transfers Overall transfer level: Modified independent Equipment used: Rolling walker (2 wheeled)             General transfer comment: min cuing for hand placement to prevent pulling on RW, SBA for lines/leads    Balance Overall balance assessment: Modified Independent Sitting-balance support: No upper extremity supported Sitting balance-Leahy Scale: Normal Sitting balance - Comments: Steady static/dynamic sitting, reaching within BOS.   Standing balance support: Bilateral upper extremity supported;No upper extremity supported Standing balance-Leahy Scale: Fair                 High Level Balance Comments: standing functional reach at least 6" from immediate BOS; good awareness of limits of stability, good use of compensatory strategies as needed           ADL either performed or assessed with clinical judgement   ADL Overall ADL's : Needs assistance/impaired                                     Functional mobility during ADLs: Rolling walker;Supervision/safety General ADL Comments: Pt at or near baseline level of funtional independence for ADL management. She performs toilet transfer/toileting with supervision/SBA for mgt of lines and leads. Min cueing for safety t/o session.     Vision Baseline Vision/History: Wears glasses Wears Glasses: Reading only Patient Visual Report: Blurring of vision Additional Comments: Blurring of vision has resolved since yesterday.     Perception     Praxis      Pertinent Vitals/Pain Pain Assessment: No/denies pain     Hand Dominance Right (Simultaneous filing.  User may not have seen previous data.)   Extremity/Trunk Assessment Upper Extremity Assessment Upper Extremity Assessment: Overall WFL for tasks assessed (Grossly 4+/5 t/o BUE with grip, FMC, and sensation WFL. Pt endorses history of BUE shoulder arthritis. Gets shots every few months for  pain mgt.)   Lower Extremity Assessment Lower Extremity Assessment: Overall WFL for tasks assessed;Defer to PT evaluation   Cervical / Trunk Assessment Cervical / Trunk Assessment: Normal   Communication Communication Communication: HOH;Expressive difficulties (Simultaneous filing. User may not have seen previous data.)   Cognition Arousal/Alertness: Awake/alert Behavior During Therapy: WFL for tasks assessed/performed Overall Cognitive Status: Within Functional Limits for tasks assessed                                 General Comments: Pleasant, conversational, generally A&Ox4. Pt reports her speech is much improved from initial presentation to the hospital. She is noted to have occasional difficulty with word finding t/o session.   General Comments       Exercises Other Exercises Other Exercises: Sit/stand from various surfaces in room (recliner, standard toilet, edge of bed), mod indep with and without assist device.  Does require use of UEs for optimal safety. Other Exercises: Donning shoes edge of bed, indep; good sitting balance, functional reach noted Other Exercises: OT facilitates bed mobility, toilet transfer, pt ambulatory with RW, SBA for mgt of lines and leads with cueing for safety t/o session; sit/stand from standard toilet, requires cueing to use grab bar; standing balance at sink for hand hygiene, mod indep   Shoulder Instructions      Home Living Family/patient expects to be discharged to:: Private residence (Simultaneous filing. User may not have seen previous data.) Living Arrangements: Alone (Simultaneous filing. User may not have seen previous data.) Available Help at Discharge: Family;Available PRN/intermittently (Daughter  Simultaneous filing. User may not have seen previous data.) Type of Home: House (Simultaneous filing. User may not have seen previous data.) Home Access: Stairs to enter (Simultaneous filing. User may not have seen previous  data.) Entrance Stairs-Number of Steps: 1 (Simultaneous filing. User may not have seen previous data.) Entrance Stairs-Rails: None (Simultaneous filing. User may not have seen previous data.) Home Layout: One level (Simultaneous filing. User may not have seen previous data.)     Bathroom Shower/Tub: Tub/shower unit   Bathroom Toilet: Handicapped height     Home Equipment: Environmental consultant - 2 wheels;Walker - 4 wheels;Grab bars - tub/shower;Hand held shower head;Shower seat (Simultaneous filing. User may not have seen previous data.)          Prior Functioning/Environment Level of Independence: Independent with assistive device(s) (Simultaneous filing. User may not have seen previous data.)        Comments: Pt states that she likes to "Keep my rollator close" on occasion will use walls to support herself. Denies falls history in last year. Performs ADL management independently with devices including shower chair for bathing. (Simultaneous filing. User may not have seen previous data.)        OT Problem List: Decreased safety awareness;Cardiopulmonary status limiting activity;Impaired vision/perception      OT Treatment/Interventions: Self-care/ADL training;Therapeutic exercise;Therapeutic activities;DME and/or AE instruction;Patient/family education;Balance training;Energy conservation    OT Goals(Current goals can be found in the care plan section) Acute Rehab OT Goals Patient Stated Goal: to get back home, hopefully today OT Goal Formulation: With patient Time For Goal Achievement: 05/18/20 Potential to Achieve Goals: Good ADL Goals  Pt Will Perform Grooming: standing;with modified independence (c LRAD PRN for improved safety and functional indep.) Pt Will Perform Lower Body Dressing: sit to/from stand;with modified independence (c LRAD PRN for improved safety and functional indep.) Additional ADL Goal #1: Pt will independently verbalize a plan to implement at least 3 learned falls  prevention strategies into her daily routines/home environment for improved safety and functional independence upon hospital DC.  OT Frequency: Min 2X/week   Barriers to D/C:            Co-evaluation              AM-PAC OT "6 Clicks" Daily Activity     Outcome Measure Help from another person eating meals?: None Help from another person taking care of personal grooming?: None Help from another person toileting, which includes using toliet, bedpan, or urinal?: A Little Help from another person bathing (including washing, rinsing, drying)?: A Little Help from another person to put on and taking off regular upper body clothing?: None Help from another person to put on and taking off regular lower body clothing?: None 6 Click Score: 22   End of Session Equipment Utilized During Treatment: Gait belt;Rolling walker Nurse Communication: Mobility status;Other (comment) (Pt does not have room phone or BSC in room.)  Activity Tolerance: Patient tolerated treatment well Patient left: in chair;with call bell/phone within reach;with chair alarm set  OT Visit Diagnosis: Other abnormalities of gait and mobility (R26.89)                Time: 9357-0177 OT Time Calculation (min): 30 min Charges:  OT General Charges $OT Visit: 1 Visit OT Evaluation $OT Eval Low Complexity: 1 Low OT Treatments $Self Care/Home Management : 23-37 mins  Shara Blazing, M.S., OTR/L Ascom: [phone] 05/04/20, 10:44 AM

## 2020-05-04 NOTE — Progress Notes (Signed)
*  PRELIMINARY RESULTS* Echocardiogram 2D Echocardiogram has been performed.  Sherrie Sport 05/04/2020, 1:10 PM

## 2020-05-09 DIAGNOSIS — M19011 Primary osteoarthritis, right shoulder: Secondary | ICD-10-CM | POA: Diagnosis not present

## 2020-05-23 DIAGNOSIS — Z79899 Other long term (current) drug therapy: Secondary | ICD-10-CM | POA: Diagnosis not present

## 2020-05-23 DIAGNOSIS — Z23 Encounter for immunization: Secondary | ICD-10-CM | POA: Diagnosis not present

## 2020-05-23 DIAGNOSIS — I639 Cerebral infarction, unspecified: Secondary | ICD-10-CM | POA: Diagnosis not present

## 2020-05-23 DIAGNOSIS — R531 Weakness: Secondary | ICD-10-CM | POA: Diagnosis not present

## 2020-05-23 DIAGNOSIS — G459 Transient cerebral ischemic attack, unspecified: Secondary | ICD-10-CM | POA: Diagnosis not present

## 2020-05-23 DIAGNOSIS — I10 Essential (primary) hypertension: Secondary | ICD-10-CM | POA: Diagnosis not present

## 2020-05-23 DIAGNOSIS — R42 Dizziness and giddiness: Secondary | ICD-10-CM | POA: Diagnosis not present

## 2020-06-08 DIAGNOSIS — R42 Dizziness and giddiness: Secondary | ICD-10-CM | POA: Diagnosis not present

## 2020-06-17 DIAGNOSIS — I10 Essential (primary) hypertension: Secondary | ICD-10-CM | POA: Diagnosis not present

## 2020-06-17 DIAGNOSIS — Z8673 Personal history of transient ischemic attack (TIA), and cerebral infarction without residual deficits: Secondary | ICD-10-CM | POA: Diagnosis not present

## 2020-06-17 DIAGNOSIS — E782 Mixed hyperlipidemia: Secondary | ICD-10-CM | POA: Diagnosis not present

## 2020-06-24 DIAGNOSIS — D72828 Other elevated white blood cell count: Secondary | ICD-10-CM | POA: Diagnosis not present

## 2020-06-24 DIAGNOSIS — E782 Mixed hyperlipidemia: Secondary | ICD-10-CM | POA: Diagnosis not present

## 2020-06-24 DIAGNOSIS — E871 Hypo-osmolality and hyponatremia: Secondary | ICD-10-CM | POA: Diagnosis not present

## 2020-06-24 DIAGNOSIS — I10 Essential (primary) hypertension: Secondary | ICD-10-CM | POA: Diagnosis not present

## 2020-06-24 DIAGNOSIS — Z0000 Encounter for general adult medical examination without abnormal findings: Secondary | ICD-10-CM | POA: Diagnosis not present

## 2020-08-18 DIAGNOSIS — E782 Mixed hyperlipidemia: Secondary | ICD-10-CM | POA: Diagnosis not present

## 2020-08-18 DIAGNOSIS — E041 Nontoxic single thyroid nodule: Secondary | ICD-10-CM | POA: Diagnosis not present

## 2020-08-18 DIAGNOSIS — M810 Age-related osteoporosis without current pathological fracture: Secondary | ICD-10-CM | POA: Diagnosis not present

## 2020-09-15 DIAGNOSIS — H40003 Preglaucoma, unspecified, bilateral: Secondary | ICD-10-CM | POA: Diagnosis not present

## 2020-10-06 DIAGNOSIS — M810 Age-related osteoporosis without current pathological fracture: Secondary | ICD-10-CM | POA: Diagnosis not present

## 2020-12-16 DIAGNOSIS — D72828 Other elevated white blood cell count: Secondary | ICD-10-CM | POA: Diagnosis not present

## 2020-12-16 DIAGNOSIS — E782 Mixed hyperlipidemia: Secondary | ICD-10-CM | POA: Diagnosis not present

## 2021-01-03 DIAGNOSIS — E871 Hypo-osmolality and hyponatremia: Secondary | ICD-10-CM | POA: Diagnosis not present

## 2021-01-03 DIAGNOSIS — E782 Mixed hyperlipidemia: Secondary | ICD-10-CM | POA: Diagnosis not present

## 2021-01-03 DIAGNOSIS — M25519 Pain in unspecified shoulder: Secondary | ICD-10-CM | POA: Diagnosis not present

## 2021-01-03 DIAGNOSIS — I10 Essential (primary) hypertension: Secondary | ICD-10-CM | POA: Diagnosis not present

## 2021-01-03 DIAGNOSIS — R42 Dizziness and giddiness: Secondary | ICD-10-CM | POA: Diagnosis not present

## 2021-01-03 DIAGNOSIS — D72828 Other elevated white blood cell count: Secondary | ICD-10-CM | POA: Diagnosis not present

## 2021-01-28 ENCOUNTER — Inpatient Hospital Stay: Payer: PPO

## 2021-01-28 ENCOUNTER — Inpatient Hospital Stay
Admission: EM | Admit: 2021-01-28 | Discharge: 2021-02-02 | DRG: 064 | Disposition: A | Discharge status: Home / Self Care | Payer: PPO | Attending: Internal Medicine | Admitting: Internal Medicine

## 2021-01-28 ENCOUNTER — Other Ambulatory Visit: Payer: Self-pay

## 2021-01-28 ENCOUNTER — Emergency Department: Payer: PPO

## 2021-01-28 ENCOUNTER — Encounter: Payer: Self-pay | Admitting: Emergency Medicine

## 2021-01-28 DIAGNOSIS — K589 Irritable bowel syndrome without diarrhea: Secondary | ICD-10-CM | POA: Diagnosis not present

## 2021-01-28 DIAGNOSIS — R55 Syncope and collapse: Secondary | ICD-10-CM | POA: Diagnosis not present

## 2021-01-28 DIAGNOSIS — M1990 Unspecified osteoarthritis, unspecified site: Secondary | ICD-10-CM | POA: Diagnosis not present

## 2021-01-28 DIAGNOSIS — Z96651 Presence of right artificial knee joint: Secondary | ICD-10-CM | POA: Diagnosis not present

## 2021-01-28 DIAGNOSIS — N1330 Unspecified hydronephrosis: Secondary | ICD-10-CM | POA: Diagnosis not present

## 2021-01-28 DIAGNOSIS — E7800 Pure hypercholesterolemia, unspecified: Secondary | ICD-10-CM | POA: Diagnosis not present

## 2021-01-28 DIAGNOSIS — R0902 Hypoxemia: Secondary | ICD-10-CM | POA: Diagnosis not present

## 2021-01-28 DIAGNOSIS — E785 Hyperlipidemia, unspecified: Secondary | ICD-10-CM | POA: Diagnosis present

## 2021-01-28 DIAGNOSIS — R42 Dizziness and giddiness: Secondary | ICD-10-CM

## 2021-01-28 DIAGNOSIS — Z20822 Contact with and (suspected) exposure to covid-19: Secondary | ICD-10-CM | POA: Diagnosis not present

## 2021-01-28 DIAGNOSIS — Z66 Do not resuscitate: Secondary | ICD-10-CM | POA: Diagnosis not present

## 2021-01-28 DIAGNOSIS — Z823 Family history of stroke: Secondary | ICD-10-CM | POA: Diagnosis not present

## 2021-01-28 DIAGNOSIS — R569 Unspecified convulsions: Secondary | ICD-10-CM | POA: Diagnosis not present

## 2021-01-28 DIAGNOSIS — I69314 Frontal lobe and executive function deficit following cerebral infarction: Secondary | ICD-10-CM | POA: Diagnosis not present

## 2021-01-28 DIAGNOSIS — Z803 Family history of malignant neoplasm of breast: Secondary | ICD-10-CM

## 2021-01-28 DIAGNOSIS — I6381 Other cerebral infarction due to occlusion or stenosis of small artery: Principal | ICD-10-CM | POA: Diagnosis present

## 2021-01-28 DIAGNOSIS — Z79899 Other long term (current) drug therapy: Secondary | ICD-10-CM | POA: Diagnosis not present

## 2021-01-28 DIAGNOSIS — E876 Hypokalemia: Secondary | ICD-10-CM | POA: Diagnosis not present

## 2021-01-28 DIAGNOSIS — I4510 Unspecified right bundle-branch block: Secondary | ICD-10-CM | POA: Diagnosis not present

## 2021-01-28 DIAGNOSIS — H9190 Unspecified hearing loss, unspecified ear: Secondary | ICD-10-CM | POA: Diagnosis not present

## 2021-01-28 DIAGNOSIS — R4701 Aphasia: Secondary | ICD-10-CM | POA: Diagnosis not present

## 2021-01-28 DIAGNOSIS — M5136 Other intervertebral disc degeneration, lumbar region: Secondary | ICD-10-CM | POA: Diagnosis present

## 2021-01-28 DIAGNOSIS — Z806 Family history of leukemia: Secondary | ICD-10-CM | POA: Diagnosis not present

## 2021-01-28 DIAGNOSIS — Z974 Presence of external hearing-aid: Secondary | ICD-10-CM | POA: Diagnosis not present

## 2021-01-28 DIAGNOSIS — I639 Cerebral infarction, unspecified: Secondary | ICD-10-CM

## 2021-01-28 DIAGNOSIS — I77819 Aortic ectasia, unspecified site: Secondary | ICD-10-CM | POA: Diagnosis not present

## 2021-01-28 DIAGNOSIS — I6529 Occlusion and stenosis of unspecified carotid artery: Secondary | ICD-10-CM | POA: Diagnosis not present

## 2021-01-28 DIAGNOSIS — I6523 Occlusion and stenosis of bilateral carotid arteries: Secondary | ICD-10-CM | POA: Diagnosis not present

## 2021-01-28 DIAGNOSIS — M8580 Other specified disorders of bone density and structure, unspecified site: Secondary | ICD-10-CM | POA: Diagnosis not present

## 2021-01-28 DIAGNOSIS — J019 Acute sinusitis, unspecified: Secondary | ICD-10-CM | POA: Diagnosis not present

## 2021-01-28 DIAGNOSIS — I10 Essential (primary) hypertension: Secondary | ICD-10-CM | POA: Diagnosis not present

## 2021-01-28 DIAGNOSIS — J9601 Acute respiratory failure with hypoxia: Secondary | ICD-10-CM | POA: Diagnosis not present

## 2021-01-28 DIAGNOSIS — R54 Age-related physical debility: Secondary | ICD-10-CM | POA: Diagnosis not present

## 2021-01-28 DIAGNOSIS — I6389 Other cerebral infarction: Secondary | ICD-10-CM | POA: Diagnosis not present

## 2021-01-28 DIAGNOSIS — Z8673 Personal history of transient ischemic attack (TIA), and cerebral infarction without residual deficits: Secondary | ICD-10-CM

## 2021-01-28 DIAGNOSIS — Z85828 Personal history of other malignant neoplasm of skin: Secondary | ICD-10-CM

## 2021-01-28 DIAGNOSIS — G319 Degenerative disease of nervous system, unspecified: Secondary | ICD-10-CM | POA: Diagnosis not present

## 2021-01-28 LAB — RESP PANEL BY RT-PCR (FLU A&B, COVID) ARPGX2
Influenza A by PCR: NEGATIVE
Influenza B by PCR: NEGATIVE
SARS Coronavirus 2 by RT PCR: NEGATIVE

## 2021-01-28 LAB — BASIC METABOLIC PANEL
Anion gap: 12 (ref 5–15)
BUN: 13 mg/dL (ref 8–23)
CO2: 25 mmol/L (ref 22–32)
Calcium: 9.8 mg/dL (ref 8.9–10.3)
Chloride: 98 mmol/L (ref 98–111)
Creatinine, Ser: 0.68 mg/dL (ref 0.44–1.00)
GFR, Estimated: >60 mL/min (ref >60)
Glucose, Bld: 158 mg/dL — ABNORMAL HIGH (ref 70–99)
Potassium: 3.8 mmol/L (ref 3.5–5.1)
Sodium: 135 mmol/L (ref 135–145)

## 2021-01-28 LAB — URINALYSIS, COMPLETE (UACMP) WITH MICROSCOPIC
Bacteria, UA: NONE SEEN
Bilirubin Urine: NEGATIVE
Glucose, UA: NEGATIVE mg/dL
Hgb urine dipstick: NEGATIVE
Ketones, ur: 5 mg/dL — AB
Leukocytes,Ua: NEGATIVE
Nitrite: NEGATIVE
Protein, ur: NEGATIVE mg/dL
Specific Gravity, Urine: 1.005 (ref 1.005–1.030)
Squamous Epithelial / HPF: NONE SEEN (ref 0–5)
pH: 7 (ref 5.0–8.0)

## 2021-01-28 LAB — CBC
HCT: 43.6 % (ref 36.0–46.0)
Hemoglobin: 14.7 g/dL (ref 12.0–15.0)
MCH: 30.4 pg (ref 26.0–34.0)
MCHC: 33.7 g/dL (ref 30.0–36.0)
MCV: 90.1 fL (ref 80.0–100.0)
Platelets: 301 10*3/uL (ref 150–400)
RBC: 4.84 MIL/uL (ref 3.87–5.11)
RDW: 13.5 % (ref 11.5–15.5)
WBC: 12.4 10*3/uL — ABNORMAL HIGH (ref 4.0–10.5)
nRBC: 0 % (ref 0.0–0.2)

## 2021-01-28 LAB — TROPONIN I (HIGH SENSITIVITY)
Troponin I (High Sensitivity): 6 ng/L (ref <18)
Troponin I (High Sensitivity): 7 ng/L (ref <18)

## 2021-01-28 MED ORDER — ONDANSETRON HCL 4 MG/2ML IJ SOLN
INTRAMUSCULAR | Status: AC
  Administered 2021-01-28: 4 mg via INTRAVENOUS
  Filled 2021-01-28: qty 2

## 2021-01-28 MED ORDER — TRAZODONE HCL 50 MG PO TABS
25.0000 mg | ORAL_TABLET | Freq: Every evening | ORAL | Status: DC | PRN
  Administered 2021-01-28 – 2021-01-31 (×2): 25 mg via ORAL
  Filled 2021-01-28 (×3): qty 1

## 2021-01-28 MED ORDER — SENNOSIDES-DOCUSATE SODIUM 8.6-50 MG PO TABS: 1.0000 | ORAL_TABLET | Freq: Every evening | ORAL | Status: DC | PRN

## 2021-01-28 MED ORDER — ONDANSETRON HCL 4 MG/2ML IJ SOLN: 4.0000 mg | Freq: Four times a day (QID) | INTRAMUSCULAR | Status: DC | PRN

## 2021-01-28 MED ORDER — SODIUM CHLORIDE 0.9 % IV SOLN: INTRAVENOUS | Status: DC

## 2021-01-28 MED ORDER — LISINOPRIL 10 MG PO TABS
10.0000 mg | ORAL_TABLET | Freq: Two times a day (BID) | ORAL | Status: DC
  Administered 2021-01-28 – 2021-01-30 (×4): 10 mg via ORAL
  Filled 2021-01-28 (×2): qty 2
  Filled 2021-01-28 (×2): qty 1

## 2021-01-28 MED ORDER — PRAVASTATIN SODIUM 40 MG PO TABS
40.0000 mg | ORAL_TABLET | Freq: Every day | ORAL | Status: DC
  Filled 2021-01-28: qty 1
  Filled 2021-01-28: qty 2

## 2021-01-28 MED ORDER — ACETAMINOPHEN 650 MG RE SUPP: 650.0000 mg | Freq: Four times a day (QID) | RECTAL | Status: DC | PRN

## 2021-01-28 MED ORDER — STROKE: EARLY STAGES OF RECOVERY BOOK: Freq: Once | Status: AC

## 2021-01-28 MED ORDER — ASPIRIN EC 81 MG PO TBEC
81.0000 mg | DELAYED_RELEASE_TABLET | Freq: Every day | ORAL | Status: DC
  Administered 2021-01-28 – 2021-02-02 (×6): 81 mg via ORAL
  Filled 2021-01-28 (×6): qty 1

## 2021-01-28 MED ORDER — MAGNESIUM HYDROXIDE 400 MG/5ML PO SUSP
30.0000 mL | Freq: Every day | ORAL | Status: DC | PRN
  Filled 2021-01-28: qty 30

## 2021-01-28 MED ORDER — ENOXAPARIN SODIUM 40 MG/0.4ML IJ SOSY
40.0000 mg | PREFILLED_SYRINGE | INTRAMUSCULAR | Status: DC
  Administered 2021-01-28: 40 mg via SUBCUTANEOUS
  Filled 2021-01-28: qty 0.4

## 2021-01-28 MED ORDER — SODIUM CHLORIDE 0.9 % IV BOLUS
500.0000 mL | Freq: Once | INTRAVENOUS | Status: AC
  Administered 2021-01-28: 500 mL via INTRAVENOUS

## 2021-01-28 MED ORDER — CLOPIDOGREL BISULFATE 75 MG PO TABS
75.0000 mg | ORAL_TABLET | Freq: Every day | ORAL | Status: DC
  Administered 2021-01-28 – 2021-01-29 (×2): 75 mg via ORAL
  Filled 2021-01-28 (×2): qty 1

## 2021-01-28 MED ORDER — IOHEXOL 350 MG/ML SOLN
75.0000 mL | Freq: Once | INTRAVENOUS | Status: AC | PRN
  Administered 2021-01-28: 75 mL via INTRAVENOUS

## 2021-01-28 MED ORDER — ONDANSETRON HCL 4 MG PO TABS: 4.0000 mg | ORAL_TABLET | Freq: Four times a day (QID) | ORAL | Status: DC | PRN

## 2021-01-28 MED ORDER — ACETAMINOPHEN 325 MG PO TABS
650.0000 mg | ORAL_TABLET | Freq: Four times a day (QID) | ORAL | Status: DC | PRN
  Administered 2021-01-28 – 2021-02-02 (×9): 650 mg via ORAL
  Filled 2021-01-28 (×9): qty 2

## 2021-01-28 MED ORDER — ONDANSETRON HCL 4 MG/2ML IJ SOLN: 4.0000 mg | Freq: Once | INTRAMUSCULAR | Status: AC

## 2021-01-28 MED ORDER — AMLODIPINE BESYLATE 5 MG PO TABS
5.0000 mg | ORAL_TABLET | Freq: Every day | ORAL | Status: DC
  Administered 2021-01-28 – 2021-01-30 (×3): 5 mg via ORAL
  Filled 2021-01-28 (×3): qty 1

## 2021-01-28 NOTE — ED Notes (Signed)
Patient transported to MRI 

## 2021-01-28 NOTE — ED Notes (Signed)
Amb SpO2, orthostatics   Lucrezia Starch, MD 01/28/21 1705

## 2021-01-28 NOTE — ED Provider Notes (Signed)
[facility] Emergency Department Provider Note ____________________________________________   Event Date/Time   First MD Initiated Contact with Patient 01/28/21 1232     (approximate)  I have reviewed the triage vital signs and the nursing notes.   HISTORY  Chief Complaint Dizziness    HPI Kellie Harris is a 87 y.o. female with PMH as noted below including hypertension, arthritis, and prior CVA presents with an episode of near syncope, acute onset while she was eating.  Patient states that she felt lightheaded all of a sudden and did not think that the room was spinning.  She was able to lay herself down on the floor and did not fall.  She states that she now feels better.  She has not had an episode of syncope or near syncope like this before.  During her stroke she had generalized weakness and aphasia, but has not had those symptoms today.  She reports 1 episode of vomiting.  She denies any shortness of breath, chest pain, headache, abdominal pain, diarrhea, urinary symptoms, or fever.   Past Medical History:  Diagnosis Date   Arthritis    right knee, left shoulder/ OSTEO   Cancer ([place])    skin    Degenerative disc disease, lumbar    L4-L5   Diverticulosis    ITIS   History of hiatal hernia    Hydronephrosis    right   Hypercholesteremia    Hypertension    CONTROLLED ON MEDS   IBS (irritable bowel syndrome)    Knee fracture, right 03/05/2015   DIFFICULTY WITH AMBULATION   Osteopenia    Ovarian cyst    Pancreatic insufficiency    Wears hearing aid    bilateral    Patient Active Problem List   Diagnosis Date Noted   Expressive aphasia 05/03/2020   S/P ORIF (open reduction internal fixation) fracture radius and ulnar fx 04/08/18 04/10/2018   Distal radius and ulna fracture, right    History of CVA (cerebrovascular accident)    Benign essential HTN    Tachycardia    Leukocytosis    Seizures ([place])    Stroke-like episode ([place]) s/p IV tPA  04/03/2018   Syncope    TIA (transient ischemic attack) 11/04/2017   Malnutrition of moderate degree 08/26/2017   Total knee replacement status 08/07/2017   Closed left hip fracture ([place]) 02/12/2017   HTN (hypertension) 02/12/2017   HLD (hyperlipidemia) 02/12/2017    Past Surgical History:  Procedure Laterality Date   CATARACT EXTRACTION W/PHACO Right 11/02/2015   Procedure: CATARACT EXTRACTION PHACO AND INTRAOCULAR LENS PLACEMENT ([place]);  Surgeon: Leandrew Koyanagi, MD;  Location: [facility];  Service: Ophthalmology;  Laterality: Right;   CATARACT EXTRACTION W/PHACO Left 07/18/2016   Procedure: CATARACT EXTRACTION PHACO AND INTRAOCULAR LENS PLACEMENT ([facility]);  Surgeon: Leandrew Koyanagi, MD;  Location: [facility];  Service: Ophthalmology;  Laterality: Left;   COLONOSCOPY     FRACTURE SURGERY Left    hip   history of left hip fracture     INTRAMEDULLARY (IM) NAIL INTERTROCHANTERIC Left 02/12/2017   Procedure: INTRAMEDULLARY (IM) NAIL INTERTROCHANTRIC;  Surgeon: Earnestine Leys, MD;  Location: [facility];  Service: Orthopedics;  Laterality: Left;   kidney stent     OOPHORECTOMY Bilateral 2015   ORIF WRIST FRACTURE Right 04/08/2018   Procedure: OPEN REDUCTION INTERNAL FIXATION (ORIF) WRIST FRACTURE;  Surgeon: Altamese Garland, MD;  Location: [facility];  Service: Orthopedics;  Laterality: Right;   TONSILLECTOMY     TOTAL KNEE  ARTHROPLASTY Right 08/07/2017   Procedure: TOTAL KNEE ARTHROPLASTY;  Surgeon: Earnestine Leys, MD;  Location: [facility];  Service: Orthopedics;  Laterality: Right;    Prior to Admission medications   Medication Sig Start Date End Date Taking? Authorizing Provider  acetaminophen (TYLENOL) 500 MG tablet Take 500-1,000 mg by mouth every 6 (six) hours as needed (for pain or headaches).    [provider]  amLODipine (NORVASC) 5 MG tablet Take 5 mg by mouth daily.    [provider]  amoxicillin (AMOXIL) 500 MG capsule Take 2,000 mg by mouth See  admin instructions. Take 2,000 mg by mouth one hour prior to dental procedures 01/01/18   [provider]  lisinopril (ZESTRIL) 10 MG tablet Take 10 mg by mouth 2 (two) times daily. 03/23/20   [provider]  pravastatin (PRAVACHOL) 40 MG tablet Take 1 tablet (40 mg total) by mouth daily at 6 PM. 05/04/20   Sheikh, Georgina Quint Latif, DO  senna-docusate (SENOKOT-S) 8.6-50 MG tablet Take 1 tablet by mouth at bedtime as needed for mild constipation. 05/04/20   Kerney Elbe, DO    Allergies Patient has no known allergies.  Family History  Problem Relation Age of Onset   Breast cancer Mother 34   Leukemia Mother    Stroke Father     Social History Social History   Tobacco Use   Smoking status: Never   Smokeless tobacco: Never  Vaping Use   Vaping Use: Never used  Substance Use Topics   Alcohol use: No   Drug use: No    Review of Systems  Constitutional: No fever. Eyes: No visual changes. ENT: No sore throat. Cardiovascular: Denies chest pain. Respiratory: Denies shortness of breath. Gastrointestinal: Positive for resolved vomiting. Genitourinary: Negative for dysuria.  Musculoskeletal: Negative for back pain. Skin: Negative for rash. Neurological: Negative for headaches, focal weakness or numbness.   ____________________________________________   PHYSICAL EXAM:  VITAL SIGNS: ED Triage Vitals  Enc Vitals Group     BP 01/28/21 1125 (!) 182/103     Pulse Rate 01/28/21 1125 (!) 108     Resp 01/28/21 1125 20     Temp 01/28/21 1125 97.7 F (36.5 C)     Temp Source 01/28/21 1125 Oral     SpO2 01/28/21 1125 95 %     Weight 01/28/21 1123 98 lb 1.7 oz (44.5 kg)     Height 01/28/21 1123 4\' 10"  (1.473 m)     Head Circumference --      Peak Flow --      Pain Score 01/28/21 1123 0     Pain Loc --      Pain Edu? --      Excl. in [place]? --     Constitutional: Alert and oriented. Well appearing for age and in no acute distress. Eyes: Conjunctivae are  normal.  EOMI.  PERRLA. Head: Atraumatic. Nose: No congestion/rhinnorhea. Mouth/Throat: Mucous membranes are moist.   Neck: Normal range of motion.  Cardiovascular: Normal rate, regular rhythm. Grossly normal heart sounds.  Good peripheral circulation. Respiratory: Normal respiratory effort.  No retractions. Lungs CTAB. Gastrointestinal: No distention.  Musculoskeletal: No lower extremity edema.  Extremities warm and well perfused.  Neurologic:  Normal speech and language.  Motor and sensory intact in all extremities.  Normal coordination with no ataxia on finger-to-nose.  No pronator drift.  No facial droop. Skin:  Skin is warm and [place]. No rash noted. Psychiatric: Mood and affect are normal. Speech and behavior are  normal.  ____________________________________________   LABS (all labs ordered are listed, but only abnormal results are displayed)  Labs Reviewed  BASIC METABOLIC PANEL - Abnormal; Notable for the following components:      Result Value   Glucose, Bld 158 (*)    All other components within normal limits  CBC - Abnormal; Notable for the following components:   WBC 12.4 (*)    All other components within normal limits  URINALYSIS, COMPLETE (UACMP) WITH MICROSCOPIC - Abnormal; Notable for the following components:   Color, Urine STRAW (*)    APPearance CLEAR (*)    Ketones, ur 5 (*)    All other components within normal limits  TROPONIN I (HIGH SENSITIVITY)  TROPONIN I (HIGH SENSITIVITY)   ____________________________________________  EKG  ED ECG REPORT I, Arta Silence, the attending physician, personally viewed and interpreted this ECG.  Date: 01/28/2021 EKG Time: 1146 Rate: 112 Rhythm: Sinus tachycardia QRS Axis: Right superior axis deviation Intervals: RBBB ST/T Wave abnormalities: Nonspecific abnormalities Narrative Interpretation: Nonspecific abnormalities with no evidence of acute ischemia; no significant change when compared to EKG of  05/03/2020  ____________________________________________  RADIOLOGY  CT head: No ICH or other acute abnormality.  Chronic infarcts.  ____________________________________________   PROCEDURES  Procedure(s) performed: No  Procedures  Critical Care performed: No ____________________________________________   INITIAL IMPRESSION / ASSESSMENT AND PLAN / ED COURSE  Pertinent labs & imaging results that were available during my care of the patient were reviewed by me and considered in my medical decision making (see chart for details).   87 year old female with PMH as noted above including hypertension, arthritis, prior CVA presents with an episode of near syncope but without fall or head injury.  Patient did not have a significant prodrome other than feeling lightheaded.  She has not had any significant associated symptoms other than one episode of vomiting.  I reviewed the past medical records in [place].  The patient was most recently seen in the ED and admitted last September with lethargy and expressive aphasia.  She has had similar episodes in the past.  There was concern that she may have had a seizure as her imaging was negative for acute findings.  The patient returned to her baseline.  On exam, the patient is overall well-appearing for her age.  Her vital signs are normal except for mild tachycardia and hypertension.  The physical exam is otherwise unremarkable.  Neurologic exam is normal at this time.  EKG is nonischemic.  Overall presentation is most consistent with a syncopal event rather than seizure since the patient did not lose consciousness or have any convulsions.  It is likely vasovagal in etiology.  I do not suspect acute stroke or TIA given that the patient had no actual focal neurologic deficits such as aphasia.  I have a low suspicion for cardiac cause given the lack of any specific EKG findings or cardiopulmonary symptoms.  We will obtain basic labs, urinalysis,  troponins x2, CT head, give a fluid bolus, and reassess.  ----------------------------------------- 4:03 PM on 01/28/2021 -----------------------------------------  CT head and lab work-up so far are unremarkable.  However, on reassessment the patient is still somewhat tachycardic.  When she ambulates, her O2 saturation drops as high as the high 70s and her heart rate increases to the 120s.  The patient has no shortness of breath or chest pain, however this concerns me for PE.  We will obtain a CT angio to rule this out and then plan for admission for further  monitoring and work-up.  I am signing the patient out to the oncoming ED physician Dr. Tamala Julian.  ____________________________________________   FINAL CLINICAL IMPRESSION(S) / ED DIAGNOSES  Final diagnoses:  Near syncope  Hypoxia      NEW MEDICATIONS STARTED DURING THIS VISIT:  New Prescriptions   No medications on file     Note:  This document was prepared using Dragon voice recognition software and may include unintentional dictation errors.    Arta Silence, MD 01/28/21 [phone]

## 2021-01-28 NOTE — ED Notes (Signed)
Patient transported to CT 

## 2021-01-28 NOTE — ED Notes (Signed)
VORB for CT Head from Dr. Cherylann Banas, pt with facial symmetry intact, denies numbness with touched, no drift noted, equal grip strength on assessment.

## 2021-01-28 NOTE — ED Notes (Signed)
Pt able to stand and pivot to the bed with 1 person assistance at this time. Pt states she is extremely dizzy. Pt did have one episode of vomiting once she got into the bed. Dr. Cherylann Banas notified, verbal orders for IV Zofran administered at this time.

## 2021-01-28 NOTE — ED Notes (Signed)
MRI called to screen pt at this time.

## 2021-01-28 NOTE — H&P (Addendum)
[place]   PATIENT NAME: Kellie Harris    MR#:  [id number]  DATE OF BIRTH:  [birth year]  DATE OF ADMISSION:  01/28/2021  PRIMARY CARE PHYSICIAN: Dion Body, MD   Patient is coming from: Home  REQUESTING/REFERRING PHYSICIAN: Hulan Saas, MD  CHIEF COMPLAINT:   Chief Complaint  Patient presents with   Dizziness    HISTORY OF PRESENT ILLNESS:  Kellie Harris is a 87 y.o. Caucasian female with medical history significant for osteoarthritis, hypertension, TIAs, dyslipidemia, degenerative disc disease and IBS, who presented to the emergency room with acute onset of near syncope with dizziness after she finished eating and was getting up.  She was able to lay herself down on the floor and did not fall.  She denied having any similar episodes in the past.  She stated that when she had a stroke she had generalized weakness and aphasia without such symptoms that happened today.  She vomited once and was more of a Pruiett heave. No tinnitus or vertigo No chest pain or palpitations.  No dyspnea or cough or wheezing.  Urinary incontinence at baseline but denied any new or worsening symptoms or stool incontinence.   No other paresthesias or focal muscle weakness.  No dysuria, oliguria or hematuria or flank pain.  No bleeding diathesis.  ED Course: Upon presentation to the emergency room blood pressure was 182/103 with a heart rate of 108 and otherwise normal vital signs.  Labs revealed unremarkable BMP and CBC showed leukocytosis of 12.4.  UA was unremarkable. EKG as reviewed by me : Showed sinus tachycardia with rate of 112, incomplete right bundle branch block, RVH with repolarization abnormality. Imaging: Noncontrasted head CT scan revealed the following: No acute intracranial abnormality.   Redemonstrated chronic infarcts within the right frontal white matter, left occipital cortex, bilateral thalami and bilateral cerebellar hemispheres.   Background mild generalized parenchymal  atrophy and moderate cerebral white matter chronic small vessel ischemic disease.   Mild right sphenoid sinus mucosal thickening.  Chest CTA revealed no evidence for pulmonary emboli, mild aortic dilatation to 4 cm descending portion with recommendation for annual imaging.  It showed diverticular change in the colon without diverticulitis and severe hydronephrosis with cortical thinning on the right which progressed in interval from prior exam.  It also showed aortic atherosclerosis.  Brain MRI showed: 1. New subcortical diffusion abnormality in the posterior right frontal lobe consistent with a small acute/subacute white matter infarct. 2. No other acute intracranial abnormality or significant interval change. 3. Stable atrophy and white matter disease. 4. Multiple other remote infarcts as described are stable. 5. Chronic left maxillary sinus disease.  The patient was given 500 mill IV normal saline bolus.  She will be admitted to a medical monitored bed for further evaluation and management.   PAST MEDICAL HISTORY:   Past Medical History:  Diagnosis Date   Arthritis    right knee, left shoulder/ OSTEO   Cancer ([place])    skin    Degenerative disc disease, lumbar    L4-L5   Diverticulosis    ITIS   History of hiatal hernia    Hydronephrosis    right   Hypercholesteremia    Hypertension    CONTROLLED ON MEDS   IBS (irritable bowel syndrome)    Knee fracture, right 03/05/2015   DIFFICULTY WITH AMBULATION   Osteopenia    Ovarian cyst    Pancreatic insufficiency    Wears hearing aid    bilateral  TIAs with aphasia  PAST SURGICAL HISTORY:   Past Surgical History:  Procedure Laterality Date   CATARACT EXTRACTION W/PHACO Right 11/02/2015   Procedure: CATARACT EXTRACTION PHACO AND INTRAOCULAR LENS PLACEMENT ([place]);  Surgeon: Leandrew Koyanagi, MD;  Location: [facility];  Service: Ophthalmology;  Laterality: Right;   CATARACT EXTRACTION W/PHACO Left 07/18/2016    Procedure: CATARACT EXTRACTION PHACO AND INTRAOCULAR LENS PLACEMENT ([facility]);  Surgeon: Leandrew Koyanagi, MD;  Location: [facility];  Service: Ophthalmology;  Laterality: Left;   COLONOSCOPY     FRACTURE SURGERY Left    hip   history of left hip fracture     INTRAMEDULLARY (IM) NAIL INTERTROCHANTERIC Left 02/12/2017   Procedure: INTRAMEDULLARY (IM) NAIL INTERTROCHANTRIC;  Surgeon: Earnestine Leys, MD;  Location: [facility];  Service: Orthopedics;  Laterality: Left;   kidney stent     OOPHORECTOMY Bilateral 2015   ORIF WRIST FRACTURE Right 04/08/2018   Procedure: OPEN REDUCTION INTERNAL FIXATION (ORIF) WRIST FRACTURE;  Surgeon: Altamese [place], MD;  Location: [facility];  Service: Orthopedics;  Laterality: Right;   TONSILLECTOMY     TOTAL KNEE ARTHROPLASTY Right 08/07/2017   Procedure: TOTAL KNEE ARTHROPLASTY;  Surgeon: Earnestine Leys, MD;  Location: [facility];  Service: Orthopedics;  Laterality: Right;    SOCIAL HISTORY:   Social History   Tobacco Use   Smoking status: Never   Smokeless tobacco: Never  Substance Use Topics   Alcohol use: No    FAMILY HISTORY:   Family History  Problem Relation Age of Onset   Breast cancer Mother 15   Leukemia Mother    Stroke Father     DRUG ALLERGIES:  No Known Allergies  REVIEW OF SYSTEMS:   ROS As per history of present illness. All pertinent systems were reviewed above. Constitutional, HEENT, cardiovascular, respiratory, GI, GU, musculoskeletal, neuro, psychiatric, endocrine, integumentary and hematologic systems were reviewed and are otherwise negative/unremarkable except for positive findings mentioned above in the HPI.   MEDICATIONS AT HOME:   Prior to Admission medications   Medication Sig Start Date End Date Taking? Authorizing Provider  acetaminophen (TYLENOL) 500 MG tablet Take 500-1,000 mg by mouth every 6 (six) hours as needed (for pain or headaches).    [provider]  amLODipine (NORVASC) 5 MG tablet Take 5 mg by  mouth daily.    [provider]  amoxicillin (AMOXIL) 500 MG capsule Take 2,000 mg by mouth See admin instructions. Take 2,000 mg by mouth one hour prior to dental procedures 01/01/18   [provider]  lisinopril (ZESTRIL) 10 MG tablet Take 10 mg by mouth 2 (two) times daily. 03/23/20   [provider]  pravastatin (PRAVACHOL) 40 MG tablet Take 1 tablet (40 mg total) by mouth daily at 6 PM. 05/04/20   Sheikh, Georgina Quint Latif, DO  senna-docusate (SENOKOT-S) 8.6-50 MG tablet Take 1 tablet by mouth at bedtime as needed for mild constipation. 05/04/20   Sheikh, Georgina Quint Latif, DO      VITAL SIGNS:  Blood pressure (!) 168/102, pulse (!) 103, temperature 97.7 F (36.5 C), temperature source Oral, resp. rate 20, height 4\' 10"  (1.473 m), weight 44.5 kg, SpO2 94 %.  PHYSICAL EXAMINATION:  Physical Exam  GENERAL:  87 y.o.-year-old Caucasian female patient lying in the bed with no acute distress.  EYES: Pupils equal, round, reactive to light and accommodation. No scleral icterus. Extraocular muscles intact.  HEENT: Head atraumatic, normocephalic. Oropharynx and nasopharynx clear.  NECK:  Supple, no jugular venous distention. No thyroid enlargement, no  tenderness.  LUNGS: Normal breath sounds bilaterally, no wheezing, rales,rhonchi or crepitation. No use of accessory muscles of respiration.  CARDIOVASCULAR: Regular rate and rhythm, S1, S2 normal. No murmurs, rubs, or gallops.  ABDOMEN: Soft, nondistended, nontender. Bowel sounds present. No organomegaly or mass.  EXTREMITIES: No pedal edema, cyanosis, or clubbing.  NEUROLOGIC: Cranial nerves II through XII are intact. Muscle strength 5/5 in all extremities. Sensation intact. Gait not checked.  PSYCHIATRIC: The patient is alert and oriented x 3.  Normal affect and good eye contact. SKIN: No obvious rash, lesion, or ulcer.   LABORATORY PANEL:   CBC Recent Labs  Lab 01/28/21 1132  WBC 12.4*  HGB 14.7  HCT 43.6  PLT 301    ------------------------------------------------------------------------------------------------------------------  Chemistries  Recent Labs  Lab 01/28/21 1132  NA 135  K 3.8  CL 98  CO2 25  GLUCOSE 158*  BUN 13  CREATININE 0.68  CALCIUM 9.8   ------------------------------------------------------------------------------------------------------------------  Cardiac Enzymes No results for input(s): TROPONINI in the last 168 hours. ------------------------------------------------------------------------------------------------------------------  RADIOLOGY:  CT Head Wo Contrast  Result Date: 01/28/2021 CLINICAL DATA:  Dizziness, nonspecific. EXAM: CT HEAD WITHOUT CONTRAST TECHNIQUE: Contiguous axial images were obtained from the base of the skull through the vertex without intravenous contrast. COMPARISON:  Brain MRI 05/03/2020. FINDINGS: Brain: Mild cerebral and cerebellar atrophy. Chronic cortically based infarct within the left occipital lobe. Chronic lacunar infarct within the mid right frontal lobe. Moderate patchy and ill-defined hypoattenuation within the cerebral white matter, nonspecific but compatible with chronic small vessel ischemic disease. Chronic lacunar infarcts within the bilateral thalami and bilateral cerebellar hemispheres. There is no acute intracranial hemorrhage. No acute demarcated cortical infarct. No extra-axial fluid collection. No evidence of intracranial mass. No midline shift. Vascular: No hyperdense vessel.  Atherosclerotic calcifications. Skull: Normal. Negative for fracture or focal suspicious osseous lesion. Sinuses/Orbits: Visualized orbits show no acute finding. Mild right sphenoid sinus mucosal thickening. IMPRESSION: No acute intracranial abnormality. Redemonstrated chronic infarcts within the right frontal white matter, left occipital cortex, bilateral thalami and bilateral cerebellar hemispheres. Background mild generalized parenchymal atrophy and  moderate cerebral white matter chronic small vessel ischemic disease. Mild right sphenoid sinus mucosal thickening. Electronically Signed   By: Kellie Simmering DO   On: 01/28/2021 12:40   CT Angio Chest PE W and/or Wo Contrast  Result Date: 01/28/2021 CLINICAL DATA:  Recent syncopal episode EXAM: CT ANGIOGRAPHY CHEST WITH CONTRAST TECHNIQUE: Multidetector CT imaging of the chest was performed using the standard protocol during bolus administration of intravenous contrast. Multiplanar CT image reconstructions and MIPs were obtained to evaluate the vascular anatomy. CONTRAST:  84mL OMNIPAQUE IOHEXOL 350 MG/ML SOLN COMPARISON:  Prior CT from 10/14/2013. FINDINGS: Cardiovascular: Thoracic aorta and its branches demonstrate atherosclerotic calcification. No findings to suggest dissection are seen. Mild dilatation to 4 cm is noted in the ascending segment. Mild turbulent flow is noted with incomplete opacification in the descending thoracic aorta. Pulmonary artery demonstrates a normal branching pattern without intraluminal filling defect to suggest pulmonary embolism. Coronary calcifications are seen. No cardiac enlargement is noted. Mediastinum/Nodes: Thoracic inlet is within normal limits. No sizable hilar or mediastinal adenopathy is noted. The esophagus as visualized is within normal limits. Lungs/Pleura: Lungs are well aerated bilaterally. No focal infiltrate or sizable effusion is seen. No sizable parenchymal nodule is noted. Upper Abdomen: Visualized upper abdomen demonstrates evidence of severe hydronephrosis in the right kidney which has worsened in the interval from a prior exam from 2015 with diffuse cortical thinning indicating longstanding obstruction.  The remainder of the upper abdomen shows diverticular change of the colon. No other focal abnormality is seen. Musculoskeletal: Degenerative changes of the thoracic spine are seen. Stable S-shaped scoliosis of the thoracic spine is noted. No acute compression  deformity is seen. Chronic L1 fracture is seen. Review of the MIP images confirms the above findings. IMPRESSION: No evidence of pulmonary emboli. Mild aortic dilatation to 4 cm in the ascending portion. Recommend annual imaging followup by CTA or MRA. This recommendation follows 2010 ACCF/AHA/AATS/ACR/ASA/SCA/SCAI/SIR/STS/SVM Guidelines for the Diagnosis and Management of Patients with Thoracic Aortic Disease. Circulation. 2010; 121: O536-U440. Aortic aneurysm NOS (ICD10-I71.9) Diverticular change of the colon without diverticulitis. Severe hydronephrosis with cortical thinning on the right progressed in the interval from the prior exam. Aortic Atherosclerosis (ICD10-I70.0). Electronically Signed   By: Inez Catalina M.D.   On: 01/28/2021 17:27   MR BRAIN WO CONTRAST  Result Date: 01/28/2021 CLINICAL DATA:  Neuro deficit, acute, stroke suspected. Episode of near syncope. EXAM: MRI HEAD WITHOUT CONTRAST TECHNIQUE: Multiplanar, multiecho pulse sequences of the brain and surrounding structures were obtained without intravenous contrast. COMPARISON:  CT head without contrast 01/28/2021. MR head without contrast 05/03/2020 FINDINGS: Brain: Focal area of subcortical diffusion abnormality is present in the posterior right frontal lobe on image 35 of series 6 and image 17 of series 7. There is some FLAIR signal associated which is new from the prior study. No other acute infarct is present. Remote infarcts involving the inferior subcortical operculum on the right, bilateral thalami, and bilateral cerebellum are stable. Confluent periventricular T2 hyperintensities bilaterally are similar the prior exam. Remote infarct of the left occipital pole is stable. Vascular: Flow is present in the major intracranial arteries. Skull and upper cervical spine: Multilevel degenerative changes of the upper cervical spine are stable craniocervical junction is within normal limits. Midline structures are otherwise unremarkable.  Sinuses/Orbits: Chronic left maxillary sinus disease again noted. There is some mucosal thickening within anterior left ethmoid air cells. The paranasal sinuses and mastoid air cells are otherwise clear. Bilateral lens replacements are noted. Globes and orbits are otherwise unremarkable. IMPRESSION: 1. New subcortical diffusion abnormality in the posterior right frontal lobe consistent with a small acute/subacute white matter infarct. 2. No other acute intracranial abnormality or significant interval change. 3. Stable atrophy and white matter disease. 4. Multiple other remote infarcts as described are stable. 5. Chronic left maxillary sinus disease. Electronically Signed   By: San Morelle M.D.   On: 01/28/2021 19:15      IMPRESSION AND PLAN:  Active Problems:   Dizziness  1.  Acute/subacute posterior right frontal lobe white matter infarction with subsequent dizziness. - The patient will be admitted to a medically monitored bed. - I will follow neurochecks for 4 hours for 24 hours. - PT/OT and ST consults will be obtained. - The patient be placed on aspirin and will add Plavix and statin therapy. - Neurology consult will be obtained. - I notified Dr. Curly [place] about the patient.  2.  Essential hypertension. - Permissive hypertension will be allowed. - We will resume antihypertensives with permissive parameters.  3.  Dyslipidemia. - We will continue statin therapy. - Fasting lipids will be checked.  4.  History of TIA's. - Management as above.  The patient will be placed on aspirin and Plavix.  DVT prophylaxis: Lovenox.  Code Status: The patient is DNR/DNI. Family Communication:  The plan of care was discussed in details with the patient (and her daughter who was with her in  the room). I answered all questions. The patient agreed to proceed with the above mentioned plan. Further management will depend upon hospital course. Disposition Plan: Back to previous home  environment Consults called: Neurology consult. All the records are reviewed and case discussed with ED provider.  Status is: Inpatient  Remains inpatient appropriate because:Ongoing diagnostic testing needed not appropriate for outpatient work up, Unsafe d/c plan, IV treatments appropriate due to intensity of illness or inability to take PO, and Inpatient level of care appropriate due to severity of illness  Dispo: The patient is from: Home              Anticipated d/c is to: Home              Patient currently is not medically stable to d/c.   Difficult to place patient No   TOTAL TIME TAKING CARE OF THIS PATIENT: 55 minutes.    Christel Mormon M.D on 01/28/2021 at 8:13 PM  [facility]   From 7 PM-7 AM, contact night-coverage [email or web address]  CC: Primary care physician; Dion Body, MD

## 2021-01-28 NOTE — ED Triage Notes (Addendum)
First RN Note: pt to ED via [facility] from home with c/o near syncopal episode. Per EMS pt laid herself down in the floor, pt did not fall in the fall. Per EMS dizziness has been ongoing all day. Per EMS pt was told by one doctor that she had a seizure disorder but was told by another doctor she did not have a seizure disorder.     168/105 All other VSS.   Pt c/o sudden onset dizziness earlier today. Pt states started at approx 0930-1000.

## 2021-01-28 NOTE — ED Provider Notes (Signed)
I sent care of this patient approximately 200.  Please see (note for full details regarding patient's initial evaluation assessment.  In brief patient presents with near syncopal episode associate with some dizziness.  CT head shows evidence of chronic prior infarcts with otherwise reassuring initial work-up including ECG and troponins and basic labs.  UA does not appear infected.  Patient is not orthostatic.  However she did have some tachycardia and slight decrease in p.o. to on trial of ambulation.  Plan is to follow-up CTA to rule out PE and if this is negative patient likely will go home.  CTA shows no evidence of PE.  There is evidence of mild aortic dilation and severe hydronephrosis progressed from prior.  No other clear acute process.  On my reassessment patient states he feels still dizzy.  She is not able to ambulate with steady gait unassisted and seems off her baseline.  Will obtain MR brain to rule out CVA.  MRI is concerning for CVA.  Daily patient is a candidate for tPA at this time.  Will admit to medicine for further evaluation and management.   Lucrezia Starch, MD 01/28/21 [phone]

## 2021-01-28 NOTE — ED Notes (Signed)
When pt did orthostatic vitals these are the results of her O2:  Sitting 92%  Standing 86-93%  Walking around room 79-83%  Heart rate also stayed in the 120s and into 140s during orthostatic vitals

## 2021-01-29 DIAGNOSIS — I6381 Other cerebral infarction due to occlusion or stenosis of small artery: Principal | ICD-10-CM

## 2021-01-29 LAB — CBC
HCT: 37 % (ref 36.0–46.0)
Hemoglobin: 12.5 g/dL (ref 12.0–15.0)
MCH: 30.3 pg (ref 26.0–34.0)
MCHC: 33.8 g/dL (ref 30.0–36.0)
MCV: 89.6 fL (ref 80.0–100.0)
Platelets: 221 10*3/uL (ref 150–400)
RBC: 4.13 MIL/uL (ref 3.87–5.11)
RDW: 13.7 % (ref 11.5–15.5)
WBC: 7.8 10*3/uL (ref 4.0–10.5)
nRBC: 0 % (ref 0.0–0.2)

## 2021-01-29 LAB — LIPID PANEL
Cholesterol: 170 mg/dL (ref 0–200)
HDL: 52 mg/dL (ref >40)
LDL Cholesterol: 93 mg/dL (ref 0–99)
Total CHOL/HDL Ratio: 3.3 RATIO
Triglycerides: 127 mg/dL (ref <150)
VLDL: 25 mg/dL (ref 0–40)

## 2021-01-29 LAB — URINE DRUG SCREEN, QUALITATIVE (ARMC ONLY)
Amphetamines, Ur Screen: NOT DETECTED
Barbiturates, Ur Screen: NOT DETECTED
Benzodiazepine, Ur Scrn: NOT DETECTED
Cannabinoid 50 Ng, Ur ~~LOC~~: NOT DETECTED
Cocaine Metabolite,Ur ~~LOC~~: NOT DETECTED
MDMA (Ecstasy)Ur Screen: NOT DETECTED
Methadone Scn, Ur: NOT DETECTED
Opiate, Ur Screen: NOT DETECTED
Phencyclidine (PCP) Ur S: NOT DETECTED
Tricyclic, Ur Screen: NOT DETECTED

## 2021-01-29 LAB — BASIC METABOLIC PANEL
Anion gap: 8 (ref 5–15)
BUN: 13 mg/dL (ref 8–23)
CO2: 24 mmol/L (ref 22–32)
Calcium: 8.5 mg/dL — ABNORMAL LOW (ref 8.9–10.3)
Chloride: 108 mmol/L (ref 98–111)
Creatinine, Ser: 0.84 mg/dL (ref 0.44–1.00)
GFR, Estimated: >60 mL/min (ref >60)
Glucose, Bld: 87 mg/dL (ref 70–99)
Potassium: 3.4 mmol/L — ABNORMAL LOW (ref 3.5–5.1)
Sodium: 140 mmol/L (ref 135–145)

## 2021-01-29 MED ORDER — ENOXAPARIN SODIUM 40 MG/0.4ML IJ SOSY
30.0000 mg | PREFILLED_SYRINGE | INTRAMUSCULAR | Status: DC
  Administered 2021-01-29 – 2021-02-01 (×4): 30 mg via SUBCUTANEOUS
  Filled 2021-01-29 (×4): qty 0.4

## 2021-01-29 MED ORDER — ATORVASTATIN CALCIUM 20 MG PO TABS
40.0000 mg | ORAL_TABLET | Freq: Every day | ORAL | Status: DC
  Administered 2021-01-29 – 2021-02-01 (×4): 40 mg via ORAL
  Filled 2021-01-29 (×4): qty 2

## 2021-01-29 MED ORDER — CLOPIDOGREL BISULFATE 75 MG PO TABS
75.0000 mg | ORAL_TABLET | Freq: Every day | ORAL | Status: DC
  Administered 2021-01-30 – 2021-02-02 (×4): 75 mg via ORAL
  Filled 2021-01-29 (×4): qty 1

## 2021-01-29 MED ORDER — POTASSIUM CHLORIDE CRYS ER 20 MEQ PO TBCR
40.0000 meq | EXTENDED_RELEASE_TABLET | Freq: Once | ORAL | Status: AC
  Administered 2021-01-29: 09:00:00 40 meq via ORAL
  Filled 2021-01-29: qty 2

## 2021-01-29 NOTE — Consult Note (Signed)
Neurology Consultation Reason for Consult: Acute/Subacute CVA on MRI brain Requesting Physician: Eulogio Bear  CC: Dizziness  History is obtained from:Patient and chart review   HPI: Kellie Harris is a 87 y.o. female with a past medical history significant for hypertension, hyperlipidemia, lumbar degenerative disc disease, skin cancer, prior stroke versus seizure (03/2018 first presented with aphasia, no residual deficits).    She reports that she was in her usual state of health yesterday, but frustrated about the power being out, and had last ambulated around 8:30 AM.  She laid down after she had initially gotten up for a morning rest which is not unusual for her, but then decided to get up again around 10 AM.  She was initially fine when she got out of bed but by the time she ambulated into the kitchen (a distance of approximately 20 feet) while in the kitchen she suddenly felt dizzy and had to sit down.  This activated her life alert, after which she spoke with her daughter and was able to readily explain everything that has happened and seemed at her cognitive baseline, just with complaints of dizziness.  She remembers them that EMS arrived at her door, but does not remember being loaded onto the ambulance or arriving initially in the ED.  This type of gap of the memory is unusual for her.  Since then she has noted continued dizziness, particularly when she leans over forwards but not necessarily just from bending her neck down towards her chest.  She does not think the intensity of the dizziness has changed since it started.  She notes some mild blurry vision in the morning which improves towards the end of the day and yesterday she noted some mild tightness of her forehead not fully meeting criteria for headache, which has since resolved.  She also had some mild nausea in the initial event but no emesis (1 episode of Viens heaving), no subsequent nausea.  She denies any fevers, chills or other infectious  symptoms.  She denies any other recent neurological events.  She was initially hypertensive to the systolics of 035K and tachycardic to 120s in the ED but this resolved with supportive care and her medications.  She additionally received trazodone 50 mg prn sleep which is not a typical home medication for her.  Regarding her prior spells of aphasia and confusion, daughter notes that those were much more severe and patient denies that this feels similar in any way.  Notably, Keppra was discontinued 05/20/2019 by Dr. Melrose Nakayama due to concern it was not helping with her spells of lightheadedness and that these were not seizure related.  Despite extensive work-up, etiology has not been definitively ascertained.    LKW:  tPA given?: No, due to out of the window Premorbid modified rankin scale:      0 - No symptoms.  ROS: All other review of systems was negative except as noted in the HPI.   Past Medical History:  Diagnosis Date   Arthritis    right knee, left shoulder/ OSTEO   Cancer ([place])    skin    Degenerative disc disease, lumbar    L4-L5   Diverticulosis    ITIS   History of hiatal hernia    Hydronephrosis    right   Hypercholesteremia    Hypertension    CONTROLLED ON MEDS   IBS (irritable bowel syndrome)    Knee fracture, right 03/05/2015   DIFFICULTY WITH AMBULATION   Osteopenia    Ovarian  cyst    Pancreatic insufficiency    Wears hearing aid    bilateral   Past Surgical History:  Procedure Laterality Date   CATARACT EXTRACTION W/PHACO Right 11/02/2015   Procedure: CATARACT EXTRACTION PHACO AND INTRAOCULAR LENS PLACEMENT (IOC);  Surgeon: Leandrew Koyanagi, MD;  Location: [place];  Service: Ophthalmology;  Laterality: Right;   CATARACT EXTRACTION W/PHACO Left 07/18/2016   Procedure: CATARACT EXTRACTION PHACO AND INTRAOCULAR LENS PLACEMENT (IOC);  Surgeon: Leandrew Koyanagi, MD;  Location: [place];  Service: Ophthalmology;  Laterality: Left;    COLONOSCOPY     FRACTURE SURGERY Left    hip   history of left hip fracture     INTRAMEDULLARY (IM) NAIL INTERTROCHANTERIC Left 02/12/2017   Procedure: INTRAMEDULLARY (IM) NAIL INTERTROCHANTRIC;  Surgeon: Earnestine Leys, MD;  Location: [facility];  Service: Orthopedics;  Laterality: Left;   kidney stent     OOPHORECTOMY Bilateral 2015   ORIF WRIST FRACTURE Right 04/08/2018   Procedure: OPEN REDUCTION INTERNAL FIXATION (ORIF) WRIST FRACTURE;  Surgeon: Altamese Warwick, MD;  Location: [place];  Service: Orthopedics;  Laterality: Right;   TONSILLECTOMY     TOTAL KNEE ARTHROPLASTY Right 08/07/2017   Procedure: TOTAL KNEE ARTHROPLASTY;  Surgeon: Earnestine Leys, MD;  Location: [facility];  Service: Orthopedics;  Laterality: Right;   Current Outpatient Medications  Medication Instructions   acetaminophen (TYLENOL) 500-1,000 mg, Oral, Every 6 hours PRN   amLODipine (NORVASC) 5 mg, Oral, Daily   amoxicillin (AMOXIL) 2,000 mg, Oral, See admin instructions, Take 2,000 mg by mouth one hour prior to dental procedures   lisinopril (ZESTRIL) 10 mg, Oral, 2 times daily   pravastatin (PRAVACHOL) 40 mg, Oral, Daily-1800   senna-docusate (SENOKOT-S) 8.6-50 MG tablet 1 tablet, Oral, At bedtime PRN     Family History  Problem Relation Age of Onset   Breast cancer Mother 59   Leukemia Mother    Stroke Father    Social History:  reports that she has never smoked. She has never used smokeless tobacco. She reports that she does not drink alcohol and does not use drugs.  Exam: Current vital signs: BP (!) 152/73 (BP Location: Left Arm)   Pulse 81   Temp 97.9 F (36.6 C)   Resp 19   Ht 4\' 9"  (1.448 m)   Wt 41.4 kg   SpO2 96%   BMI 19.75 kg/m  Vital signs in last 24 hours: Temp:  [97.7 F (36.5 C)-98.5 F (36.9 C)] 97.9 F (36.6 C) (06/19 0932) Pulse Rate:  [78-125] 81 (06/19 0932) Resp:  [16-25] 19 (06/19 0932) BP: (126-182)/(73-117) 152/73 (06/19 0932) SpO2:  [91 %-96 %] 96 % (06/19 0932) Weight:   [41.4 kg-44.5 kg] 41.4 kg (06/18 2153)   Physical Exam  Constitutional: Appears slightly frail and thin, sitting at the side of the bed and eating dinner Psych: Affect appropriate to situation, calm and cooperative, pleasant Eyes: No scleral injection HENT: No oropharyngeal obstruction.  MSK: no joint deformities.  Cardiovascular: Normal rate and regular rhythm.  Respiratory: Effort normal, non-labored breathing GI: Soft.  No distension. There is no tenderness.  Skin: Warm K[place]oed and intact visible skin, though there are scattered bruises which she attributes to being on aspirin  Neuro: Mental Status: Patient is awake, alert, oriented to person, place, month, year, and situation.  She reports that tomorrow is March [place] but does not identify today as Father's Day when asked if there is a holiday right now Patient is able to give a clear and  coherent history as detailed above with 1 memory gap No signs of aphasia or neglect Cranial Nerves: II: Visual Fields are full. Pupils are equal, round, and reactive to light.   III,IV, VI: EOMI without ptosis or diploplia.  On end gaze bilaterally there is some slight nystagmus, though she has trouble following the command to maintain fixation on examiner's finger and frequently breaks fixation which makes it hard to fully characterize whether this is physiologic and gaze nystagmus versus central direction changing nystagmus.  She has mildly restricted upgaze within expected limits of age V: Facial sensation is symmetric to light touch VII: Facial movement is symmetric.  VIII: hearing is intact to voice, slightly hard of hearing at baseline X: Uvula elevates symmetrically XI: Shoulder shrug is symmetric. XII: tongue is midline without atrophy or fasciculations.  Motor: Tone is normal. Bulk is normal. 4+/5 strength was present in all four extremities.  Sensory: Sensation is symmetric to light touch in the arms and legs. Deep Tendon Reflexes: 2+ and  symmetric in the biceps and patellae, brisk Plantars: Toes are downgoing bilaterally.  Cerebellar: FNF and HKS are intact bilaterally within limits of arthritic pain Gait: She reports some dizziness with leaning slightly forward and looking down, she does have a slightly wide-based gait   I have reviewed labs in epic and the results pertinent to this consultation are: Lab Results  Component Value Date   CHOL 170 01/29/2021   HDL 52 01/29/2021   LDLCALC 93 01/29/2021   TRIG 127 01/29/2021   CHOLHDL 3.3 01/29/2021   Lab Results  Component Value Date   HGBA1C 4.9 93/79/0240    Basic Metabolic Panel: Recent Labs  Lab 01/28/21 1132 01/29/21 0541  NA 135 140  K 3.8 3.4*  CL 98 108  CO2 25 24  GLUCOSE 158* 87  BUN 13 13  CREATININE 0.68 0.84  CALCIUM 9.8 8.5*    CBC: Recent Labs  Lab 01/28/21 1132 01/29/21 0541  WBC 12.4* 7.8  HGB 14.7 12.5  HCT 43.6 37.0  MCV 90.1 89.6  PLT 301 221    I have reviewed the images obtained: CT head without clear acute intracranial process though there is significant chronic microvascular disease and prior strokes which may mask a more acute process MRI brain with likely subacute infarct in the right parietal region, personally reviewed, with patient and family at bedside Cartoid ultrasounds:  1. No significant flow-limiting stenosis within either carotid artery. Estimated 0-49% stenosis bilaterally. Stable exam.  Impression: This is an 87 year old woman with past medical history as above presenting with dizziness that appears somewhat positional.  Characteristics are atypical for BPPV and this symptom does not correlate with the location of stroke on MRI.  Possibly multifactorial dizziness with component of cervical disc disease, prior strokes, frailty.  Recommendations:  # Acute to subacute right subcortical stroke, likely related to small vessel disease, incidental  -LDL above goal of 70, increased intensity of statin from home  pravastatin to atorvastatin 40 mg nightly -A1c meeting goal at less than 7% -Continue dual antiplatelet therapy for 21 days, after extended discussion patient agreed to continue Plavix despite propensity for bruising -Goal normotension  # Persistent dizziness -Unclear etiology, does not correlate with the location of her stroke -Atypical history for BPPV -Vestibular physical therapy evaluation -If persistent consider referral for specialized testing out of vestibular disorders clinic for videonystagmography  Neurology will be available on an as-needed basis going forward, please reconsult if new questions arise  Lesleigh Noe MD-PhD  [facility] [phone] [facility] coverage for [facility] is from 8 AM to 4 AM in-house and 4 PM to 8 PM by telephone/video. 8 PM to 8 AM emergent questions or overnight urgent questions should be addressed to Teleneurology On-call or [facility] neurohospitalist; contact information can be found on AMION

## 2021-01-29 NOTE — Progress Notes (Signed)
Progress Note    Kellie Harris  EXB:[id number] DOB: Dec 25, 1930  DOA: 01/28/2021 PCP: Dion Body, MD    Brief Narrative:    Medical records reviewed and are as summarized below:  Kellie Harris is an 87 y.o. female with medical history significant for osteoarthritis, hypertension, TIAs, dyslipidemia, degenerative disc disease and IBS, who presented to the emergency room with acute onset of near syncope with dizziness after she finished eating and was getting up.  She was able to lay herself down on the floor and did not fall.  She denied having any similar episodes in the past.  She stated that when she had a stroke she had generalized weakness and aphasia without such symptoms that happened today.  MRI showed area of CVA.  Work up in progress.   Assessment/Plan:   Active Problems:   Dizziness    Acute/subacute posterior right frontal lobe white matter infarction with subsequent dizziness. LDL: 93 -HgbA1c: pending - PT/OT and ST consults will be obtained. - ASA/plavix -carotid negative -echo pending -tele sinus thus far - await neurology consult  essential hypertension. - Permissive hypertension will be allowed.  Dyslipidemia. - We will continue statin therapy. - LDL: 93-- goal of <70  Hypokalemia -replete     Family Communication/Anticipated D/C date and plan/Code Status   DVT prophylaxis: Lovenox ordered. Code Status: DNR  Disposition Plan: Status is: Inpatient  Remains inpatient appropriate because:Inpatient level of care appropriate due to severity of illness  Dispo: The patient is from: Home              Anticipated d/c is to:  tbd              Patient currently is not medically stable to d/c. - neurology work up in progress   Difficult to place patient No         Medical Consultants:   neurology  Subjective:   Needs to urinate this AM  Objective:    Vitals:   01/28/21 2153 01/29/21 0144 01/29/21 0530 01/29/21 0932  BP: (!) 168/99  (!) 143/83 126/80 (!) 152/73  Pulse: 94 78 89 81  Resp: 18 16 18 19   Temp: 97.9 F (36.6 C) 97.9 F (36.6 C) 98 F (36.7 C) 97.9 F (36.6 C)  TempSrc: Oral     SpO2: 95% 95% 93% 96%  Weight: 41.4 kg     Height: 4\' 9"  (1.448 m)       Intake/Output Summary (Last 24 hours) at 01/29/2021 0941 Last data filed at 01/29/2021 0730 Gross per 24 hour  Intake 1348.08 ml  Output --  Net 1348.08 ml   Filed Weights   01/28/21 1123 01/28/21 2153  Weight: 44.5 kg 41.4 kg    Exam:  General: Appearance:    Frail female in no acute distress     Lungs:     respirations unlabored  Heart:    Normal heart rate.    MS:   All extremities are intact.    Neurologic:   Awake, alert, pleasant and cooperative     Data Reviewed:   I have personally reviewed following labs and imaging studies:  Labs: Labs show the following:   Basic Metabolic Panel: Recent Labs  Lab 01/28/21 1132 01/29/21 0541  NA 135 140  K 3.8 3.4*  CL 98 108  CO2 25 24  GLUCOSE 158* 87  BUN 13 13  CREATININE 0.68 0.84  CALCIUM 9.8 8.5*   GFR Estimated Creatinine Clearance: 28.8  mL/min (by C-G formula based on SCr of 0.84 mg/dL). Liver Function Tests: No results for input(s): AST, ALT, ALKPHOS, BILITOT, PROT, ALBUMIN in the last 168 hours. No results for input(s): LIPASE, AMYLASE in the last 168 hours. No results for input(s): AMMONIA in the last 168 hours. Coagulation profile No results for input(s): INR, PROTIME in the last 168 hours.  CBC: Recent Labs  Lab 01/28/21 1132 01/29/21 0541  WBC 12.4* 7.8  HGB 14.7 12.5  HCT 43.6 37.0  MCV 90.1 89.6  PLT 301 221   Cardiac Enzymes: No results for input(s): CKTOTAL, CKMB, CKMBINDEX, TROPONINI in the last 168 hours. BNP (last 3 results) No results for input(s): PROBNP in the last 8760 hours. CBG: No results for input(s): GLUCAP in the last 168 hours. D-Dimer: No results for input(s): DDIMER in the last 72 hours. Hgb A1c: No results for input(s):  HGBA1C in the last 72 hours. Lipid Profile: Recent Labs    01/29/21 0541  CHOL 170  HDL 52  LDLCALC 93  TRIG 127  CHOLHDL 3.3   Thyroid function studies: No results for input(s): TSH, T4TOTAL, T3FREE, THYROIDAB in the last 72 hours.  Invalid input(s): FREET3 Anemia work up: No results for input(s): VITAMINB12, FOLATE, FERRITIN, TIBC, IRON, RETICCTPCT in the last 72 hours. Sepsis Labs: Recent Labs  Lab 01/28/21 1132 01/29/21 0541  WBC 12.4* 7.8    Microbiology Recent Results (from the past 240 hour(s))  Resp Panel by RT-PCR (Flu A&B, Covid) Nasopharyngeal Swab     Status: None   Collection Time: 01/28/21  4:29 PM   Specimen: Nasopharyngeal Swab; Nasopharyngeal(NP) swabs in vial transport medium  Result Value Ref Range Status   SARS Coronavirus 2 by RT PCR NEGATIVE NEGATIVE Final    Comment: (NOTE) SARS-CoV-2 target nucleic acids are NOT DETECTED.  The SARS-CoV-2 RNA is generally detectable in upper respiratory specimens during the acute phase of infection. The lowest concentration of SARS-CoV-2 viral copies this assay can detect is 138 copies/mL. A negative result does not preclude SARS-Cov-2 infection and should not be used as the sole basis for treatment or other patient management decisions. A negative result may occur with  improper specimen collection/handling, submission of specimen other than nasopharyngeal swab, presence of viral mutation(s) within the areas targeted by this assay, and inadequate number of viral copies(<138 copies/mL). A negative result must be combined with clinical observations, patient history, and epidemiological information. The expected result is Negative.  Fact Sheet for Patients:  EntrepreneurPulse.com.au  Fact Sheet for Healthcare Providers:  IncredibleEmployment.be  This test is no t yet approved or cleared by the [place] FDA and  has been authorized for detection and/or diagnosis of  SARS-CoV-2 by FDA under an Emergency Use Authorization (EUA). This EUA will remain  in effect (meaning this test can be used) for the duration of the COVID-19 declaration under Section 564(b)(1) of the Act, 21 U.S.C.section 360bbb-3(b)(1), unless the authorization is terminated  or revoked sooner.       Influenza A by PCR NEGATIVE NEGATIVE Final   Influenza B by PCR NEGATIVE NEGATIVE Final    Comment: (NOTE) The Xpert Xpress SARS-CoV-2/FLU/RSV plus assay is intended as an aid in the diagnosis of influenza from Nasopharyngeal swab specimens and should not be used as a sole basis for treatment. Nasal washings and aspirates are unacceptable for Xpert Xpress SARS-CoV-2/FLU/RSV testing.  Fact Sheet for Patients: EntrepreneurPulse.com.au  Fact Sheet for Healthcare Providers: IncredibleEmployment.be  This test is not yet approved or cleared by  the [employer] and has been authorized for detection and/or diagnosis of SARS-CoV-2 by FDA under an Emergency Use Authorization (EUA). This EUA will remain in effect (meaning this test can be used) for the duration of the COVID-19 declaration under Section 564(b)(1) of the Act, 21 U.S.C. section 360bbb-3(b)(1), unless the authorization is terminated or revoked.  Performed at [facility], [address], [place], [place] [address]     Procedures and diagnostic studies:  CT Head Wo Contrast  Result Date: 01/28/2021 CLINICAL DATA:  Dizziness, nonspecific. EXAM: CT HEAD WITHOUT CONTRAST TECHNIQUE: Contiguous axial images were obtained from the base of the skull through the vertex without intravenous contrast. COMPARISON:  Brain MRI 05/03/2020. FINDINGS: Brain: Mild cerebral and cerebellar atrophy. Chronic cortically based infarct within the left occipital lobe. Chronic lacunar infarct within the mid right frontal lobe. Moderate patchy and ill-defined hypoattenuation within the cerebral white  matter, nonspecific but compatible with chronic small vessel ischemic disease. Chronic lacunar infarcts within the bilateral thalami and bilateral cerebellar hemispheres. There is no acute intracranial hemorrhage. No acute demarcated cortical infarct. No extra-axial fluid collection. No evidence of intracranial mass. No midline shift. Vascular: No hyperdense vessel.  Atherosclerotic calcifications. Skull: Normal. Negative for fracture or focal suspicious osseous lesion. Sinuses/Orbits: Visualized orbits show no acute finding. Mild right sphenoid sinus mucosal thickening. IMPRESSION: No acute intracranial abnormality. Redemonstrated chronic infarcts within the right frontal white matter, left occipital cortex, bilateral thalami and bilateral cerebellar hemispheres. Background mild generalized parenchymal atrophy and moderate cerebral white matter chronic small vessel ischemic disease. Mild right sphenoid sinus mucosal thickening. Electronically Signed   By: Kellie Simmering DO   On: 01/28/2021 12:40   CT Angio Chest PE W and/or Wo Contrast  Result Date: 01/28/2021 CLINICAL DATA:  Recent syncopal episode EXAM: CT ANGIOGRAPHY CHEST WITH CONTRAST TECHNIQUE: Multidetector CT imaging of the chest was performed using the standard protocol during bolus administration of intravenous contrast. Multiplanar CT image reconstructions and MIPs were obtained to evaluate the vascular anatomy. CONTRAST:  47mL OMNIPAQUE IOHEXOL 350 MG/ML SOLN COMPARISON:  Prior CT from 10/14/2013. FINDINGS: Cardiovascular: Thoracic aorta and its branches demonstrate atherosclerotic calcification. No findings to suggest dissection are seen. Mild dilatation to 4 cm is noted in the ascending segment. Mild turbulent flow is noted with incomplete opacification in the descending thoracic aorta. Pulmonary artery demonstrates a normal branching pattern without intraluminal filling defect to suggest pulmonary embolism. Coronary calcifications are seen. No  cardiac enlargement is noted. Mediastinum/Nodes: Thoracic inlet is within normal limits. No sizable hilar or mediastinal adenopathy is noted. The esophagus as visualized is within normal limits. Lungs/Pleura: Lungs are well aerated bilaterally. No focal infiltrate or sizable effusion is seen. No sizable parenchymal nodule is noted. Upper Abdomen: Visualized upper abdomen demonstrates evidence of severe hydronephrosis in the right kidney which has worsened in the interval from a prior exam from 2015 with diffuse cortical thinning indicating longstanding obstruction. The remainder of the upper abdomen shows diverticular change of the colon. No other focal abnormality is seen. Musculoskeletal: Degenerative changes of the thoracic spine are seen. Stable S-shaped scoliosis of the thoracic spine is noted. No acute compression deformity is seen. Chronic L1 fracture is seen. Review of the MIP images confirms the above findings. IMPRESSION: No evidence of pulmonary emboli. Mild aortic dilatation to 4 cm in the ascending portion. Recommend annual imaging followup by CTA or MRA. This recommendation follows 2010 ACCF/AHA/AATS/ACR/ASA/SCA/SCAI/SIR/STS/SVM Guidelines for the Diagnosis and Management of Patients with Thoracic Aortic Disease. Circulation.  2010; 121: A193-X902. Aortic aneurysm NOS (ICD10-I71.9) Diverticular change of the colon without diverticulitis. Severe hydronephrosis with cortical thinning on the right progressed in the interval from the prior exam. Aortic Atherosclerosis (ICD10-I70.0). Electronically Signed   By: Inez Catalina M.D.   On: 01/28/2021 17:27   MR BRAIN WO CONTRAST  Result Date: 01/28/2021 CLINICAL DATA:  Neuro deficit, acute, stroke suspected. Episode of near syncope. EXAM: MRI HEAD WITHOUT CONTRAST TECHNIQUE: Multiplanar, multiecho pulse sequences of the brain and surrounding structures were obtained without intravenous contrast. COMPARISON:  CT head without contrast 01/28/2021. MR head  without contrast 05/03/2020 FINDINGS: Brain: Focal area of subcortical diffusion abnormality is present in the posterior right frontal lobe on image 35 of series 6 and image 17 of series 7. There is some FLAIR signal associated which is new from the prior study. No other acute infarct is present. Remote infarcts involving the inferior subcortical operculum on the right, bilateral thalami, and bilateral cerebellum are stable. Confluent periventricular T2 hyperintensities bilaterally are similar the prior exam. Remote infarct of the left occipital pole is stable. Vascular: Flow is present in the major intracranial arteries. Skull and upper cervical spine: Multilevel degenerative changes of the upper cervical spine are stable craniocervical junction is within normal limits. Midline structures are otherwise unremarkable. Sinuses/Orbits: Chronic left maxillary sinus disease again noted. There is some mucosal thickening within anterior left ethmoid air cells. The paranasal sinuses and mastoid air cells are otherwise clear. Bilateral lens replacements are noted. Globes and orbits are otherwise unremarkable. IMPRESSION: 1. New subcortical diffusion abnormality in the posterior right frontal lobe consistent with a small acute/subacute white matter infarct. 2. No other acute intracranial abnormality or significant interval change. 3. Stable atrophy and white matter disease. 4. Multiple other remote infarcts as described are stable. 5. Chronic left maxillary sinus disease. Electronically Signed   By: San Morelle M.D.   On: 01/28/2021 19:15   US Carotid Bilateral (at [facility] and [facility] only)  Result Date: 01/28/2021 CLINICAL DATA:  Acute lacunar infarct, hypertension, syncope, hyperlipidemia EXAM: BILATERAL CAROTID DUPLEX ULTRASOUND TECHNIQUE: Pearline Cables scale imaging, color Doppler and duplex ultrasound were performed of bilateral carotid and vertebral arteries in the neck. COMPARISON:  05/03/2020 FINDINGS: Criteria:  Quantification of carotid stenosis is based on velocity parameters that correlate the residual internal carotid diameter with NASCET-based stenosis levels, using the diameter of the distal internal carotid lumen as the denominator for stenosis measurement. The following velocity measurements were obtained: RIGHT ICA: 74/23 cm/sec CCA: 40/97 cm/sec SYSTOLIC ICA/CCA RATIO:  1.3 ECA: 67 cm/sec LEFT ICA: 92/30 cm/sec CCA: 35/32 cm/sec SYSTOLIC ICA/CCA RATIO:  1.7 ECA: 98 cm/sec RIGHT CAROTID ARTERY: Minimal atheromatous plaque at the bifurcation unchanged. No significant flow limiting stenosis. RIGHT VERTEBRAL ARTERY:  Normal antegrade flow. LEFT CAROTID ARTERY: Mild atheromatous plaque at the carotid bifurcation. No significant flow-limiting stenosis. LEFT VERTEBRAL ARTERY:  Normal antegrade flow. IMPRESSION: 1. No significant flow-limiting stenosis within either carotid artery. Estimated 0-49% stenosis bilaterally. Stable exam. Electronically Signed   By: Randa Ngo M.D.   On: 01/28/2021 21:11    Medications:    amLODipine  5 mg Oral Daily   aspirin EC  81 mg Oral Daily   clopidogrel  75 mg Oral Daily   enoxaparin (LOVENOX) injection  30 mg Subcutaneous Q24H   lisinopril  10 mg Oral BID   pravastatin  40 mg Oral q1800   Continuous Infusions:  sodium chloride 100 mL/hr at 01/29/21 0730     LOS: 1 day  Geradine Girt  [facility]   How to contact the [facility] Attending or Consulting provider [facility] or covering provider during after hours [place], for this patient?  Check the care team in [facility] and look for a) attending/consulting [facility] provider listed and b) the [facility] team listed Log into [email or web address] and use [place]'s universal password to access. If you do not have the password, please contact the hospital operator. Locate the [facility] provider you are looking for under [facility] and page to a number that you can be directly reached. If you still have difficulty reaching the  provider, please page the [facility] (Director on Call) for the Hospitalists listed on amion for assistance.  01/29/2021, 9:41 AM

## 2021-01-29 NOTE — Progress Notes (Signed)
PHARMACIST - PHYSICIAN COMMUNICATION  CONCERNING:  Enoxaparin (Lovenox) for DVT Prophylaxis    RECOMMENDATION: Patient was prescribed enoxaprin 40mg  q24 hours for VTE prophylaxis.   Filed Weights   01/28/21 1123 01/28/21 2153  Weight: 44.5 kg (98 lb 1.7 oz) 41.4 kg (91 lb 4.3 oz)    Body mass index is 19.75 kg/m.  Estimated Creatinine Clearance: 30.2 mL/min (by C-G formula based on SCr of 0.68 mg/dL).   Patient is candidate for enoxaparin 30mg  every 24 hours based on CrCl <98ml/min or Weight <45kg  DESCRIPTION: Pharmacy has adjusted enoxaparin dose per [facility] policy.  Patient is now receiving enoxaparin 30 mg every 24 hours   Renda Rolls, PharmD, [facility] 01/29/2021 12:51 AM

## 2021-01-29 NOTE — Evaluation (Signed)
Occupational Therapy Evaluation Patient Details Name: Kellie Harris MRN: [id number] DOB: 07/14/1931 Today's Date: 01/29/2021    History of Present Illness Pt is an 87 y/o M admitted on 01/28/21 with c/c of dizziness & near sycope. MRI revealed acute/subacute posterior R frontal lobe white matter infarct. PMH: OA, HTN, TIAs with aphasia, dyslipidemia, DDD, IBS, arthritis, hiatal hernia, R knee fx   Clinical Impression   Kellie Harris presents today with generalized weakness, fatigue, dizziness with positional changes, and impaired balance. She denies pain. Pt is able to perform bed mobility, transfers, LB dressing, grooming, toileting, all with Mod I/SUPV, but requires breaks between positional changes, 2/2 dizziness. No LOB during OOB activity, with use of RW. Recommend ongoing OT while pt is hospitalized. Unless pt's dizziness resolves, recommend DC to SNF for pt safety and to improve balance, functional mobility, and assist pt to return to PLOF.     Follow Up Recommendations  SNF    Equipment Recommendations  None recommended by OT    Recommendations for Other Services       Precautions / Restrictions Precautions Precautions: Fall Restrictions Weight Bearing Restrictions: No      Mobility Bed Mobility Overal bed mobility: Modified Independent                  Transfers Overall transfer level: Needs assistance Equipment used: Rolling walker (2 wheeled) Transfers: Sit to/from [employer] Transfers Sit to Stand: Supervision Stand pivot transfers: Supervision            Balance Overall balance assessment: Needs assistance Sitting-balance support: Feet supported;Bilateral upper extremity supported Sitting balance-Kellie Harris Scale: Good Sitting balance - Comments: endorses dizziness with supine<sit, but no LOB   Standing balance support: Bilateral upper extremity supported;Single extremity supported;During functional activity Standing balance-Kellie Harris Scale: Good                              ADL either performed or assessed with clinical judgement   ADL Overall ADL's : Needs assistance/impaired     Grooming: Wash/Kendrix hands;Independent;Sitting               Lower Body Dressing: Modified independent;Sit to/from stand Lower Body Dressing Details (indicate cue type and reason): donning/doffing shoes Toilet Transfer: Supervision/safety;RW;Regular Toilet;Stand-pivot   Toileting- Clothing Manipulation and Hygiene: Sit to/from stand;Independent       Functional mobility during ADLs: Supervision/safety;Rolling walker       Vision Baseline Vision/History: Wears glasses Wears Glasses: At all times Patient Visual Report: No change from baseline       Perception     Praxis      Pertinent Vitals/Pain Pain Assessment: No/denies pain     Hand Dominance Right   Extremity/Trunk Assessment Upper Extremity Assessment Upper Extremity Assessment: Overall WFL for tasks assessed (limited b/l UE ROM)   Lower Extremity Assessment Lower Extremity Assessment: Overall WFL for tasks assessed       Communication Communication Communication: HOH   Cognition Arousal/Alertness: Awake/alert Behavior During Therapy: WFL for tasks assessed/performed Overall Cognitive Status: Within Functional Limits for tasks assessed                                     General Comments  Dizziness with positional changes    Exercises Other Exercises Other Exercises: Educ re: falls prevention, home/routines modifications, role of OT, DC options   Shoulder Instructions  Home Living Family/patient expects to be discharged to:: Private residence Living Arrangements: Alone Available Help at Discharge: Family;Available PRN/intermittently Type of Home: House Home Access: Stairs to enter [employer] of Steps: 1 Entrance Stairs-Rails: Left Home Layout: One level     Bathroom Shower/Tub: Teacher, early years/pre:  Handicapped height     Home Equipment: Walker - 2 wheels;Grab bars - tub/shower;Hand held shower head;Shower seat;Cane - quad;Cane - single point;Other (comment) (rollator)          Prior Functioning/Environment Level of Independence: Needs assistance  Gait / Transfers Assistance Needed: uses rollator in house, has a RW in carport for community access and walking in driving/yard ADL's / Homemaking Assistance Needed: manages her own ADL and most IADL. Recently stopped driving, daughter handles grocery shopping   Comments: Pt repports she is independent without AD, keeps rollator "close" but primarily furniture walks. Daughter lives in [place] but takes pt to appointments, provides groceries. Pt does not drive, fixes small simple meals for herself.        OT Problem List: Decreased strength;Decreased range of motion;Decreased activity tolerance;Impaired balance (sitting and/or standing);Decreased knowledge of use of DME or AE      OT Treatment/Interventions: Self-care/ADL training;Therapeutic exercise;Patient/family education;Balance training;Energy conservation;Therapeutic activities;DME and/or AE instruction    OT Goals(Current goals can be found in the care plan section) Acute Rehab OT Goals Patient Stated Goal: get better OT Goal Formulation: With patient Time For Goal Achievement: 02/12/21 Potential to Achieve Goals: Good ADL Goals Pt Will Perform Grooming: standing;Independently (in standing at sink w/o dizziness) Pt Will Perform Tub/Shower Transfer: Shower transfer;with modified independence;shower seat Additional ADL Goal #1: Pt will identify/demonstrate 2+ falls prevention techniques  OT Frequency: Min 1X/week   Barriers to D/C:            Co-evaluation              AM-PAC OT "6 Clicks" Daily Activity     Outcome Measure Help from another person eating meals?: None Help from another person taking care of personal grooming?: None Help from another person  toileting, which includes using toliet, bedpan, or urinal?: A Little Help from another person bathing (including washing, rinsing, drying)?: A Little Help from another person to put on and taking off regular upper body clothing?: A Little Help from another person to put on and taking off regular lower body clothing?: A Little 6 Click Score: 20   End of Session Equipment Utilized During Treatment: Rolling walker Nurse Communication: Other (comment);Mobility status (pt's IV leaking)  Activity Tolerance: Patient tolerated treatment well Patient left: in bed;with nursing/sitter in room;with bed alarm set;with call bell/phone within reach  OT Visit Diagnosis: Unsteadiness on feet (R26.81);Muscle weakness (generalized) (M62.81)                Time: 1610-9604 OT Time Calculation (min): 36 min Charges:  OT General Charges $OT Visit: 1 Visit OT Evaluation $OT Eval Moderate Complexity: 1 Mod OT Treatments $Self Care/Home Management : 23-37 mins Josiah Lobo, PhD, MS, OTR/L 01/29/21, 3:14 PM

## 2021-01-29 NOTE — Evaluation (Signed)
Physical Therapy Evaluation Patient Details Name: Kellie Harris MRN: [id number] DOB: Jul 29, 1931 Today's Date: 01/29/2021   History of Present Illness  Pt is an 87 y/o M admitted on 01/28/21 with c/c of dizziness & near sycope. MRI revealed acute/subacute posterior R frontal lobe white matter infarct. PMH: OA, HTN, TIAs with aphasia, dyslipidemia, DDD, IBS, arthritis, hiatal hernia, R knee fx  Clinical Impression  Pt seen for PT evaluation with pt endorsing dizziness throughout session but no nystagmus noted with positional changes. Pt with elevated BP but MD cleared pt for mobilization. Pt is able to complete bed mobility with supervision with bed rails but requires min/mod assist for transfers. Pt is able to ambulate very short gait distance with RW & min assist with impaired balance noted. Pt is unsafe to d/c home alone and would benefit from STR upon d/c to maximize independence with functional mobility & reduce fall risk prior to return home. Will continue to follow pt acutely to progress mobility as able.  BP checked in LUE: Supine in bed: 153/111 mmHg (MAP 120), HR 95 bpm Rechecked supine in bed: 162/106 mmHg (MAP 124), HR 89 bpm After transferring to recliner: BP 168/115 mmHg (MAP 129)    Follow Up Recommendations SNF    Equipment Recommendations   (TBD in next venue)    Recommendations for Other Services       Precautions / Restrictions Precautions Precautions: Fall Restrictions Weight Bearing Restrictions: No      Mobility  Bed Mobility Overal bed mobility: Needs Assistance Bed Mobility: Supine to Sit     Supine to sit: Supervision     General bed mobility comments: use of bed rails    Transfers Overall transfer level: Needs assistance Equipment used: 1 person hand held assist Transfers: Sit to/from Stand;Stand Pivot Transfers Sit to Stand: Min assist Stand pivot transfers: Mod assist          Ambulation/Gait Ambulation/Gait assistance: Min assist Gait  Distance (Feet): 8 Feet Assistive device: Rolling walker (2 wheeled) Gait Pattern/deviations: Decreased step length - right;Decreased step length - left;Decreased stride length;Decreased dorsiflexion - right;Decreased dorsiflexion - left Gait velocity: decreased   General Gait Details: instability  Stairs            Wheelchair Mobility    Modified Rankin (Stroke Patients Only)       Balance Overall balance assessment: Needs assistance Sitting-balance support: Feet supported;Bilateral upper extremity supported Sitting balance-Leahy Scale: Fair Sitting balance - Comments: pt is able to sit with supervision then lowers herself down onto bed 2/2 self reported LOB   Standing balance support: No upper extremity supported;During functional activity Standing balance-Leahy Scale: Poor                               Pertinent Vitals/Pain Pain Assessment: No/denies pain    Home Living Family/patient expects to be discharged to:: Private residence Living Arrangements: Alone Available Help at Discharge: Family;Available PRN/intermittently Type of Home: House Home Access: Stairs to enter Entrance Stairs-Rails: Left Entrance Stairs-Number of Steps: 1 Home Layout: One level Home Equipment: Walker - 2 wheels;Grab bars - tub/shower;Hand held shower head;Shower seat;Cane - quad;Cane - single point (rollator)      Prior Function Level of Independence: Needs assistance         Comments: Pt repports she is independent without AD, keeps rollator "close" but primarily furniture walks. Daughter lives in [place] but takes pt to appointments, provides groceries. Pt  does not drive, fixes small simple meals for herself.     Hand Dominance   Dominant Hand: Right    Extremity/Trunk Assessment   Upper Extremity Assessment Upper Extremity Assessment: RUE deficits/detail;LUE deficits/detail (BUE finger to nose equal & intact, very minimal BUE shoulder flexion (<45  degrees) 2/2 hx of arthritis)    Lower Extremity Assessment Lower Extremity Assessment: Generalized weakness (BLE heel to shin slow & fairly equal)    Cervical / Trunk Assessment Cervical / Trunk Assessment:  (pt denies numbness/tingling)  Communication   Communication: HOH (even with hearing aides)  Cognition Arousal/Alertness: Awake/alert Behavior During Therapy: WFL for tasks assessed/performed Overall Cognitive Status: Within Functional Limits for tasks assessed                                 General Comments: Very pleasant lady      General Comments General comments (skin integrity, edema, etc.): Pt with c/o dizziness, no nystagmus noted with various positional changes.    Exercises     Assessment/Plan    PT Assessment Patient needs continued PT services  PT Problem List Decreased strength;Decreased mobility;Decreased range of motion;Decreased activity tolerance;Decreased balance;Decreased knowledge of use of DME       PT Treatment Interventions DME instruction;Therapeutic exercise;Gait training;Balance training;Stair training;Neuromuscular re-education;Manual techniques;Modalities;Functional mobility training;Therapeutic activities;Patient/family education    PT Goals (Current goals can be found in the Care Plan section)  Acute Rehab PT Goals Patient Stated Goal: get better PT Goal Formulation: With patient Time For Goal Achievement: 02/12/21 Potential to Achieve Goals: Good    Frequency 7X/week   Barriers to discharge Decreased caregiver support;Inaccessible home environment lives alone, step to enter house    Co-evaluation               AM-PAC PT "6 Clicks" Mobility  Outcome Measure Help needed turning from your back to your side while in a flat bed without using bedrails?: None Help needed moving from lying on your back to sitting on the side of a flat bed without using bedrails?: None Help needed moving to and from a bed to a chair  (including a wheelchair)?: A Lot Help needed standing up from a chair using your arms (e.g., wheelchair or bedside chair)?: A Little Help needed to walk in hospital room?: A Lot Help needed climbing 3-5 steps with a railing? : A Lot 6 Click Score: 17    End of Session Equipment Utilized During Treatment: Gait belt Activity Tolerance: Patient tolerated treatment well (limited 2/2 dizziness) Patient left: in chair;with chair alarm set;with call bell/phone within reach Nurse Communication: Mobility status PT Visit Diagnosis: Unsteadiness on feet (R26.81);Muscle weakness (generalized) (M62.81);Difficulty in walking, not elsewhere classified (R26.2)    Time: 4536-4680 PT Time Calculation (min) (ACUTE ONLY): 26 min   Charges:   PT Evaluation $PT Eval Low Complexity: 1 Low PT Treatments $Therapeutic Activity: 8-22 mins        Lavone Nian, PT, DPT 01/29/21, 10:31 AM   Waunita Schooner 01/29/2021, 10:29 AM

## 2021-01-30 ENCOUNTER — Inpatient Hospital Stay (HOSPITAL_COMMUNITY)
Admit: 2021-01-30 | Discharge: 2021-01-30 | Disposition: A | Discharge status: Home / Self Care | Payer: PPO | Attending: Internal Medicine | Admitting: Internal Medicine

## 2021-01-30 DIAGNOSIS — I6389 Other cerebral infarction: Secondary | ICD-10-CM

## 2021-01-30 LAB — ECHOCARDIOGRAM COMPLETE
AV Mean grad: 5 mmHg
AV Peak grad: 7.4 mmHg
Ao pk vel: 1.36 m/s
Area-P 1/2: 3.19 cm2
Height: 57 in
S' Lateral: 2.36 cm
Weight: 1460.33 oz

## 2021-01-30 LAB — HEMOGLOBIN A1C
Hgb A1c MFr Bld: 5.1 % (ref 4.8–5.6)
Mean Plasma Glucose: 100 mg/dL

## 2021-01-30 LAB — TSH: TSH: 1.066 u[IU]/mL (ref 0.350–4.500)

## 2021-01-30 MED ORDER — AMLODIPINE BESYLATE 10 MG PO TABS
10.0000 mg | ORAL_TABLET | Freq: Every day | ORAL | Status: DC
  Administered 2021-01-31 – 2021-02-02 (×3): 10 mg via ORAL
  Filled 2021-01-30 (×3): qty 1

## 2021-01-30 MED ORDER — POTASSIUM CHLORIDE CRYS ER 20 MEQ PO TBCR
20.0000 meq | EXTENDED_RELEASE_TABLET | Freq: Once | ORAL | Status: AC
  Administered 2021-01-30: 20 meq via ORAL
  Filled 2021-01-30: qty 1

## 2021-01-30 MED ORDER — LISINOPRIL 20 MG PO TABS
20.0000 mg | ORAL_TABLET | Freq: Two times a day (BID) | ORAL | Status: DC
  Administered 2021-01-30 – 2021-02-02 (×6): 20 mg via ORAL
  Filled 2021-01-30 (×6): qty 1

## 2021-01-30 MED ORDER — MECLIZINE HCL 12.5 MG PO TABS
12.5000 mg | ORAL_TABLET | Freq: Three times a day (TID) | ORAL | Status: DC | PRN
  Administered 2021-01-31: 12.5 mg via ORAL
  Filled 2021-01-30 (×3): qty 1

## 2021-01-30 NOTE — Progress Notes (Addendum)
Progress Note    Kellie Harris  KCM:[id number] DOB: February 28, 1931  DOA: 01/28/2021 PCP: Dion Body, MD    Brief Narrative:    Medical records reviewed and are as summarized below:  Kellie Harris is an 87 y.o. female with medical history significant for osteoarthritis, hypertension, TIAs, dyslipidemia, degenerative disc disease and IBS, who presented to the emergency room with acute onset of near syncope with dizziness after she finished eating and was getting up.  She was able to lay herself down on the floor and did not fall.  She denied having any similar episodes in the past.  She stated that when she had a stroke she had generalized weakness and aphasia without such symptoms that happened today.  MRI showed area of CVA.  Work up in progress.   Assessment/Plan:   Active Problems:   Dizziness    Acute/subacute posterior right frontal lobe white matter infarction with subsequent dizziness. LDL: 93 -HgbA1c: pending - PT/OT and ST consults will be obtained. - ASA/plavix -carotid negative -echo : pending -tele sinus- occ fast but regular - neurology consult: Continue dual antiplatelet therapy for 21 days, after extended discussion patient agreed to continue Plavix despite propensity for bruising Persistent dizziness -Unclear etiology, does not correlate with the location of her stroke -Atypical history for BPPV -Vestibular physical therapy evaluation -If persistent consider referral for specialized testing out of vestibular disorders clinic for videonystagmography  essential hypertension. - resume home meds  Dyslipidemia. - increase statin - LDL: 93-- goal of <70  Hypokalemia -replete  Mild aortic dilatation to 4 cm in the ascending portion. Recommend annual imaging followup by CTA or MRA.   Family Communication/Anticipated D/C date and plan/Code Status   DVT prophylaxis: Lovenox ordered. Code Status: DNR Called daughter, mailbox full, no answer or way to leave  message Disposition Plan: Status is: Inpatient  Remains inpatient appropriate because:Inpatient level of care appropriate due to severity of illness  Dispo: The patient is from: Home              Anticipated d/c is to:  tbd  SNF?              Patient currently is not medically stable to d/c. SNF   Difficult to place patient No         Medical Consultants:   neurology  Subjective:   Still with dizziness  Objective:    Vitals:   01/29/21 2324 01/30/21 0430 01/30/21 0814 01/30/21 1158  BP: (!) 157/89 (!) 157/94 (!) 161/84 135/89  Pulse: 74 66 65 82  Resp: 16 16 16 16   Temp: 98 F (36.7 C) 97.6 F (36.4 C) 97.7 F (36.5 C) 98.2 F (36.8 C)  TempSrc:      SpO2: 100% 98% 99% 95%  Weight:      Height:        Intake/Output Summary (Last 24 hours) at 01/30/2021 1236 Last data filed at 01/30/2021 1034 Gross per 24 hour  Intake 2827.11 ml  Output --  Net 2827.11 ml   Filed Weights   01/28/21 1123 01/28/21 2153  Weight: 44.5 kg 41.4 kg    Exam:  General: Appearance:    Thin female in no acute distress- hard of hearing     Lungs:     respirations unlabored  Heart:    Normal heart rate.    MS:   All extremities are intact.    Neurologic:   Awake, alert, oriented x 3. Hard of hearing  w/o hearing aides.        Data Reviewed:   I have personally reviewed following labs and imaging studies:  Labs: Labs show the following:   Basic Metabolic Panel: Recent Labs  Lab 01/28/21 1132 01/29/21 0541  NA 135 140  K 3.8 3.4*  CL 98 108  CO2 25 24  GLUCOSE 158* 87  BUN 13 13  CREATININE 0.68 0.84  CALCIUM 9.8 8.5*   GFR Estimated Creatinine Clearance: 28.8 mL/min (by C-G formula based on SCr of 0.84 mg/dL). Liver Function Tests: No results for input(s): AST, ALT, ALKPHOS, BILITOT, PROT, ALBUMIN in the last 168 hours. No results for input(s): LIPASE, AMYLASE in the last 168 hours. No results for input(s): AMMONIA in the last 168 hours. Coagulation  profile No results for input(s): INR, PROTIME in the last 168 hours.  CBC: Recent Labs  Lab 01/28/21 1132 01/29/21 0541  WBC 12.4* 7.8  HGB 14.7 12.5  HCT 43.6 37.0  MCV 90.1 89.6  PLT 301 221   Cardiac Enzymes: No results for input(s): CKTOTAL, CKMB, CKMBINDEX, TROPONINI in the last 168 hours. BNP (last 3 results) No results for input(s): PROBNP in the last 8760 hours. CBG: No results for input(s): GLUCAP in the last 168 hours. D-Dimer: No results for input(s): DDIMER in the last 72 hours. Hgb A1c: Recent Labs    01/29/21 0541  HGBA1C 5.1   Lipid Profile: Recent Labs    01/29/21 0541  CHOL 170  HDL 52  LDLCALC 93  TRIG 127  CHOLHDL 3.3   Thyroid function studies: No results for input(s): TSH, T4TOTAL, T3FREE, THYROIDAB in the last 72 hours.  Invalid input(s): FREET3 Anemia work up: No results for input(s): VITAMINB12, FOLATE, FERRITIN, TIBC, IRON, RETICCTPCT in the last 72 hours. Sepsis Labs: Recent Labs  Lab 01/28/21 1132 01/29/21 0541  WBC 12.4* 7.8    Microbiology Recent Results (from the past 240 hour(s))  Resp Panel by RT-PCR (Flu A&B, Covid) Nasopharyngeal Swab     Status: None   Collection Time: 01/28/21  4:29 PM   Specimen: Nasopharyngeal Swab; Nasopharyngeal(NP) swabs in vial transport medium  Result Value Ref Range Status   SARS Coronavirus 2 by RT PCR NEGATIVE NEGATIVE Final    Comment: (NOTE) SARS-CoV-2 target nucleic acids are NOT DETECTED.  The SARS-CoV-2 RNA is generally detectable in upper respiratory specimens during the acute phase of infection. The lowest concentration of SARS-CoV-2 viral copies this assay can detect is 138 copies/mL. A negative result does not preclude SARS-Cov-2 infection and should not be used as the sole basis for treatment or other patient management decisions. A negative result may occur with  improper specimen collection/handling, submission of specimen other than nasopharyngeal swab, presence of viral  mutation(s) within the areas targeted by this assay, and inadequate number of viral copies(<138 copies/mL). A negative result must be combined with clinical observations, patient history, and epidemiological information. The expected result is Negative.  Fact Sheet for Patients:  EntrepreneurPulse.com.au  Fact Sheet for Healthcare Providers:  IncredibleEmployment.be  This test is no t yet approved or cleared by the [place] FDA and  has been authorized for detection and/or diagnosis of SARS-CoV-2 by FDA under an Emergency Use Authorization (EUA). This EUA will remain  in effect (meaning this test can be used) for the duration of the COVID-19 declaration under Section 564(b)(1) of the Act, 21 U.S.C.section 360bbb-3(b)(1), unless the authorization is terminated  or revoked sooner.       Influenza A by  PCR NEGATIVE NEGATIVE Final   Influenza B by PCR NEGATIVE NEGATIVE Final    Comment: (NOTE) The Xpert Xpress SARS-CoV-2/FLU/RSV plus assay is intended as an aid in the diagnosis of influenza from Nasopharyngeal swab specimens and should not be used as a sole basis for treatment. Nasal washings and aspirates are unacceptable for Xpert Xpress SARS-CoV-2/FLU/RSV testing.  Fact Sheet for Patients: EntrepreneurPulse.com.au  Fact Sheet for Healthcare Providers: IncredibleEmployment.be  This test is not yet approved or cleared by the [place] FDA and has been authorized for detection and/or diagnosis of SARS-CoV-2 by FDA under an Emergency Use Authorization (EUA). This EUA will remain in effect (meaning this test can be used) for the duration of the COVID-19 declaration under Section 564(b)(1) of the Act, 21 U.S.C. section 360bbb-3(b)(1), unless the authorization is terminated or revoked.  Performed at [facility], [address] [place], [place] [address]     Procedures and diagnostic  studies:  CT Angio Chest PE W and/or Wo Contrast  Result Date: 01/28/2021 CLINICAL DATA:  Recent syncopal episode EXAM: CT ANGIOGRAPHY CHEST WITH CONTRAST TECHNIQUE: Multidetector CT imaging of the chest was performed using the standard protocol during bolus administration of intravenous contrast. Multiplanar CT image reconstructions and MIPs were obtained to evaluate the vascular anatomy. CONTRAST:  57mL OMNIPAQUE IOHEXOL 350 MG/ML SOLN COMPARISON:  Prior CT from 10/14/2013. FINDINGS: Cardiovascular: Thoracic aorta and its branches demonstrate atherosclerotic calcification. No findings to suggest dissection are seen. Mild dilatation to 4 cm is noted in the ascending segment. Mild turbulent flow is noted with incomplete opacification in the descending thoracic aorta. Pulmonary artery demonstrates a normal branching pattern without intraluminal filling defect to suggest pulmonary embolism. Coronary calcifications are seen. No cardiac enlargement is noted. Mediastinum/Nodes: Thoracic inlet is within normal limits. No sizable hilar or mediastinal adenopathy is noted. The esophagus as visualized is within normal limits. Lungs/Pleura: Lungs are well aerated bilaterally. No focal infiltrate or sizable effusion is seen. No sizable parenchymal nodule is noted. Upper Abdomen: Visualized upper abdomen demonstrates evidence of severe hydronephrosis in the right kidney which has worsened in the interval from a prior exam from 2015 with diffuse cortical thinning indicating longstanding obstruction. The remainder of the upper abdomen shows diverticular change of the colon. No other focal abnormality is seen. Musculoskeletal: Degenerative changes of the thoracic spine are seen. Stable S-shaped scoliosis of the thoracic spine is noted. No acute compression deformity is seen. Chronic L1 fracture is seen. Review of the MIP images confirms the above findings. IMPRESSION: No evidence of pulmonary emboli. Mild aortic dilatation to 4  cm in the ascending portion. Recommend annual imaging followup by CTA or MRA. This recommendation follows 2010 ACCF/AHA/AATS/ACR/ASA/SCA/SCAI/SIR/STS/SVM Guidelines for the Diagnosis and Management of Patients with Thoracic Aortic Disease. Circulation. 2010; 121: E703-J009. Aortic aneurysm NOS (ICD10-I71.9) Diverticular change of the colon without diverticulitis. Severe hydronephrosis with cortical thinning on the right progressed in the interval from the prior exam. Aortic Atherosclerosis (ICD10-I70.0). Electronically Signed   By: Inez Catalina M.D.   On: 01/28/2021 17:27   MR BRAIN WO CONTRAST  Result Date: 01/28/2021 CLINICAL DATA:  Neuro deficit, acute, stroke suspected. Episode of near syncope. EXAM: MRI HEAD WITHOUT CONTRAST TECHNIQUE: Multiplanar, multiecho pulse sequences of the brain and surrounding structures were obtained without intravenous contrast. COMPARISON:  CT head without contrast 01/28/2021. MR head without contrast 05/03/2020 FINDINGS: Brain: Focal area of subcortical diffusion abnormality is present in the posterior right frontal lobe on image 35 of series 6 and image  17 of series 7. There is some FLAIR signal associated which is new from the prior study. No other acute infarct is present. Remote infarcts involving the inferior subcortical operculum on the right, bilateral thalami, and bilateral cerebellum are stable. Confluent periventricular T2 hyperintensities bilaterally are similar the prior exam. Remote infarct of the left occipital pole is stable. Vascular: Flow is present in the major intracranial arteries. Skull and upper cervical spine: Multilevel degenerative changes of the upper cervical spine are stable craniocervical junction is within normal limits. Midline structures are otherwise unremarkable. Sinuses/Orbits: Chronic left maxillary sinus disease again noted. There is some mucosal thickening within anterior left ethmoid air cells. The paranasal sinuses and mastoid air cells  are otherwise clear. Bilateral lens replacements are noted. Globes and orbits are otherwise unremarkable. IMPRESSION: 1. New subcortical diffusion abnormality in the posterior right frontal lobe consistent with a small acute/subacute white matter infarct. 2. No other acute intracranial abnormality or significant interval change. 3. Stable atrophy and white matter disease. 4. Multiple other remote infarcts as described are stable. 5. Chronic left maxillary sinus disease. Electronically Signed   By: San Morelle M.D.   On: 01/28/2021 19:15   US Carotid Bilateral (at [facility] and [facility] only)  Result Date: 01/28/2021 CLINICAL DATA:  Acute lacunar infarct, hypertension, syncope, hyperlipidemia EXAM: BILATERAL CAROTID DUPLEX ULTRASOUND TECHNIQUE: Pearline Cables scale imaging, color Doppler and duplex ultrasound were performed of bilateral carotid and vertebral arteries in the neck. COMPARISON:  05/03/2020 FINDINGS: Criteria: Quantification of carotid stenosis is based on velocity parameters that correlate the residual internal carotid diameter with NASCET-based stenosis levels, using the diameter of the distal internal carotid lumen as the denominator for stenosis measurement. The following velocity measurements were obtained: RIGHT ICA: 74/23 cm/sec CCA: 52/84 cm/sec SYSTOLIC ICA/CCA RATIO:  1.3 ECA: 67 cm/sec LEFT ICA: 92/30 cm/sec CCA: 13/24 cm/sec SYSTOLIC ICA/CCA RATIO:  1.7 ECA: 98 cm/sec RIGHT CAROTID ARTERY: Minimal atheromatous plaque at the bifurcation unchanged. No significant flow limiting stenosis. RIGHT VERTEBRAL ARTERY:  Normal antegrade flow. LEFT CAROTID ARTERY: Mild atheromatous plaque at the carotid bifurcation. No significant flow-limiting stenosis. LEFT VERTEBRAL ARTERY:  Normal antegrade flow. IMPRESSION: 1. No significant flow-limiting stenosis within either carotid artery. Estimated 0-49% stenosis bilaterally. Stable exam. Electronically Signed   By: Randa Ngo M.D.   On: 01/28/2021 21:11     Medications:    amLODipine  5 mg Oral Daily   aspirin EC  81 mg Oral Daily   atorvastatin  40 mg Oral QHS   clopidogrel  75 mg Oral Daily   enoxaparin (LOVENOX) injection  30 mg Subcutaneous Q24H   lisinopril  10 mg Oral BID   potassium chloride  20 mEq Oral Once   Continuous Infusions:     LOS: 2 days   Geradine Girt  [facility]   How to contact the [facility] Attending or Consulting provider [place] or covering provider during after hours [place], for this patient?  Check the care team in [facility] and look for a) attending/consulting [facility] provider listed and b) the [facility] team listed Log into [email or web address] and use [place]'s universal password to access. If you do not have the password, please contact the hospital operator. Locate the [facility] provider you are looking for under [facility] and page to a number that you can be directly reached. If you still have difficulty reaching the provider, please page the [facility] (Director on Call) for the Hospitalists listed on amion for assistance.  01/30/2021, 12:36  PM

## 2021-01-30 NOTE — NC FL2 (Signed)
[place] LEVEL OF CARE SCREENING TOOL     IDENTIFICATION  Patient Name: Kellie Harris Birthdate: April 10, 1931 Sex: female Admission Date (Current Location): 01/28/2021  [facility] and [place] Number:  Engineering geologist and Address:  [facility], [address], [place], [place] [address]      Provider Number: [id number]  Attending Physician Name and Address:  Geradine Girt, DO  Relative Name and Phone Number:  Soderlund,[name] (Daughter)   [phone] [facility])    Current Level of Care: Hospital Recommended Level of Care: [place] Prior Approval Number:    Date Approved/Denied:   PASRR Number: [id number]  Discharge Plan: SNF    Current Diagnoses: Patient Active Problem List   Diagnosis Date Noted   Dizziness 01/28/2021   Expressive aphasia 05/03/2020   S/P ORIF (open reduction internal fixation) fracture radius and ulnar fx 04/08/18 04/10/2018   Distal radius and ulna fracture, right    History of CVA (cerebrovascular accident)    Benign essential HTN    Tachycardia    Leukocytosis    Seizures (HCC)    Stroke-like episode ([place]) s/p IV tPA 04/03/2018   Syncope    TIA (transient ischemic attack) 11/04/2017   Malnutrition of moderate degree 08/26/2017   Total knee replacement status 08/07/2017   Closed left hip fracture ([place]) 02/12/2017   HTN (hypertension) 02/12/2017   HLD (hyperlipidemia) 02/12/2017    Orientation RESPIRATION BLADDER Height & Weight     Self, Time, Situation, Place  Normal Continent Weight: 41.4 kg Height:  4\' 9"  (144.8 cm)  BEHAVIORAL SYMPTOMS/MOOD NEUROLOGICAL BOWEL NUTRITION STATUS     (dizziness exacerbated when patient arises) Continent Diet (Heart)  AMBULATORY STATUS COMMUNICATION OF NEEDS Skin   Extensive Assist (Patient complains of extreme dizziness when arising and has hypertension) Verbally Other (Comment) (ecchymosis bilateral arms)                       Personal Care  Assistance Level of Assistance  Bathing, Feeding, Dressing Bathing Assistance: Limited assistance Feeding assistance: Independent Dressing Assistance: Limited assistance     Functional Limitations Info  Sight, Hearing, Speech Sight Info: Adequate (glasses) Hearing Info: Adequate Speech Info: Adequate    SPECIAL CARE FACTORS FREQUENCY  PT (By licensed PT), OT (By licensed OT)     PT Frequency: 5x weekly or as per facility standards OT Frequency: 5x weekly or as per facility standards            Contractures Contractures Info: Not present    Additional Factors Info  Code Status, Allergies Code Status Info: DNR Allergies Info: No Known Allergies           Current Medications (01/30/2021):  This is the current hospital active medication list Current Facility-Administered Medications  Medication Dose Route Frequency Provider Last Rate Last Admin   acetaminophen (TYLENOL) tablet 650 mg  650 mg Oral Q6H PRN Mansy, Jan A, MD   650 mg at 01/30/21 3419   Or   acetaminophen (TYLENOL) suppository 650 mg  650 mg Rectal Q6H PRN Mansy, Jan A, MD       amLODipine (NORVASC) tablet 5 mg  5 mg Oral Daily Mansy, Jan A, MD   5 mg at 01/30/21 0919   aspirin EC tablet 81 mg  81 mg Oral Daily Mansy, Jan A, MD   81 mg at 01/30/21 0919   atorvastatin (LIPITOR) tablet 40 mg  40 mg Oral QHS Bhagat, Srishti L,  MD   40 mg at 01/29/21 2052   clopidogrel (PLAVIX) tablet 75 mg  75 mg Oral Daily Bhagat, Srishti L, MD   75 mg at 01/30/21 0919   enoxaparin (LOVENOX) injection 30 mg  30 mg Subcutaneous Q24H Renda Rolls, RPH   30 mg at 01/29/21 2052   lisinopril (ZESTRIL) tablet 10 mg  10 mg Oral BID Mansy, Jan A, MD   10 mg at 01/30/21 0919   magnesium hydroxide (MILK OF MAGNESIA) suspension 30 mL  30 mL Oral Daily PRN Mansy, Jan A, MD       meclizine (ANTIVERT) tablet 12.5 mg  12.5 mg Oral TID PRN Geradine Girt, DO       ondansetron [facility]) tablet 4 mg  4 mg Oral Q6H PRN Mansy, Jan A, MD       Or    ondansetron [facility]) injection 4 mg  4 mg Intravenous Q6H PRN Mansy, Jan A, MD       potassium chloride SA (KLOR-CON) CR tablet 20 mEq  20 mEq Oral Once Vann, Jessica U, DO       senna-docusate (Senokot-S) tablet 1 tablet  1 tablet Oral QHS PRN Mansy, Jan A, MD       traZODone (DESYREL) tablet 25 mg  25 mg Oral QHS PRN Mansy, Jan A, MD   25 mg at 01/28/21 2251     Discharge Medications: Please see discharge summary for a list of discharge medications.  Relevant Imaging Results:  Relevant Lab Results:   Additional Information    Pete Pelt, RN

## 2021-01-30 NOTE — Progress Notes (Signed)
*  PRELIMINARY RESULTS* Echocardiogram 2D Echocardiogram has been performed.  Kellie Harris 01/30/2021, 2:35 PM

## 2021-01-30 NOTE — Progress Notes (Addendum)
Physical Therapy Treatment Patient Details Name: Kellie Harris MRN: [id number] DOB: 1930-08-31 Today's Date: 01/30/2021    History of Present Illness Pt is an 87 y/o M admitted on 01/28/21 with c/c of dizziness & near sycope. MRI revealed acute/subacute posterior R frontal lobe white matter infarct. PMH: OA, HTN, TIAs with aphasia, dyslipidemia, DDD, IBS, arthritis, hiatal hernia, R knee fx    PT Comments    Patient alert, in bed, agreeable to PT. Pt screened for vestibular involvement of dizziness; no significant findings. No nystagmus noted throughout session, placed in dix hallpike position L and R, with no findings. The patient demonstrated good progress in mobility today. Bed mobility modI, sit <> stand with RW and CGA for safety. She was able to ambulate >37ft with RW and CGA, as well as use standard commode and perform pericare with supervision. Of note, pt with complaints of dizziness (at least 8/10) with any changes in position, that does decreased with rest and time. HR elevated >120 BPM with any mobility, MD notified. The patient would benefit from further skilled PT intervention to continue to progress towards goals. Recommendation remains appropriate due to pts current decline in functional mobility from PLOF and safety concerns with mobility.  BP assessed, pt not orthostatic. Sitting: 160/89 (HR 112), standing initial 153/97, standing three minutes 150/98.   Follow Up Recommendations  SNF;Supervision for mobility/OOB;Supervision - Intermittent     Equipment Recommendations  None recommended by PT    Recommendations for Other Services       Precautions / Restrictions Precautions Precautions: Fall Restrictions Weight Bearing Restrictions: No    Mobility  Bed Mobility Overal bed mobility: Modified Independent                  Transfers Overall transfer level: Needs assistance Equipment used: Rolling walker (2 wheeled) Transfers: Sit to/from Stand Sit to Stand:  Min guard Stand pivot transfers: Min guard          Ambulation/Gait Ambulation/Gait assistance: Min Gaffer (Feet): 150 Feet Assistive device: Rolling walker (2 wheeled) Gait Pattern/deviations: Decreased step length - right;Decreased step length - left;Decreased stride length;Decreased dorsiflexion - right;Decreased dorsiflexion - left   Gait velocity interpretation: <1.8 ft/sec, indicate of risk for recurrent falls General Gait Details: pt with dizziness with ambulation, and head turns.   Stairs             Wheelchair Mobility    Modified Rankin (Stroke Patients Only)       Balance Overall balance assessment: Needs assistance Sitting-balance support: Feet supported Sitting balance-Leahy Scale: Good     Standing balance support: Bilateral upper extremity supported;Single extremity supported;During functional activity Standing balance-Leahy Scale: Good Standing balance comment: pt most comfortable with UE support                            Cognition Arousal/Alertness: Awake/alert Behavior During Therapy: WFL for tasks assessed/performed Overall Cognitive Status: Within Functional Limits for tasks assessed                                        Exercises Other Exercises Other Exercises: Pt able to ambulate into bathroom, perform pericare with supervision Other Exercises: pt assessed for vestibular causes of dizziness; no significant findings.    General Comments General comments (skin integrity, edema, etc.): dizziness with positional changes  Pertinent Vitals/Pain Pain Assessment: No/denies pain    Home Living                      Prior Function            PT Goals (current goals can now be found in the care plan section) Progress towards PT goals: Progressing toward goals    Frequency    7X/week      PT Plan Current plan remains appropriate    Co-evaluation               AM-PAC PT "6 Clicks" Mobility   Outcome Measure  Help needed turning from your back to your side while in a flat bed without using bedrails?: None Help needed moving from lying on your back to sitting on the side of a flat bed without using bedrails?: None Help needed moving to and from a bed to a chair (including a wheelchair)?: None Help needed standing up from a chair using your arms (e.g., wheelchair or bedside chair)?: None Help needed to walk in hospital room?: A Little Help needed climbing 3-5 steps with a railing? : A Little 6 Click Score: 22    End of Session Equipment Utilized During Treatment: Gait belt Activity Tolerance: Patient tolerated treatment well Patient left: in chair;with chair alarm set;with call bell/phone within reach Nurse Communication: Mobility status PT Visit Diagnosis: Unsteadiness on feet (R26.81);Muscle weakness (generalized) (M62.81);Difficulty in walking, not elsewhere classified (R26.2)     Time: 6151-8343 PT Time Calculation (min) (ACUTE ONLY): 54 min  Charges:  $Therapeutic Exercise: 23-37 mins $Therapeutic Activity: 23-37 mins                     Lieutenant Diego PT, DPT 3:23 PM,01/30/21

## 2021-01-30 NOTE — TOC Progression Note (Signed)
Transition of Care [facility]) - Progression Note    Patient Details  Name: JAZZMAN LOUGHMILLER MRN: [id number] Date of Birth: [birth year]  Transition of Care [facility]) CM/SW [place], RN Phone Number: 01/30/2021, 2:04 PM  Clinical Narrative:   Patient lives alone and has no concerns about being independent other than the fact that she is dizzy when getting out of bed.  She is concerned about being alone and dizzy, as her daughter lives out of town and cannot always come to her house to assist.  RNCM answered SNF questions and explained that her insurance, [employer], would approve the facility stay and transportation to the facility prior to her transfer, patient felt reassured as she was not sure how she would pay for the facility.  Daughter is coming in later this afternoon, and patient would like RNCM to speak with daughter about disposition options.  Patient authorized SNF bed search prior to daughter's arrival, Bed search started.  TOC contact information given, TOC to follow to discharge.    Expected Discharge Plan: [place] Barriers to Discharge: Continued Medical Work up  Expected Discharge Plan and Services Expected Discharge Plan: [place]   Discharge Planning Services: CM Consult Post Acute Care Choice: Nursing Home (SNF if patient qualifed) Living arrangements for the past 2 months: Single Family Home                                       Social Determinants of Health (SDOH) Interventions    Readmission Risk Interventions No flowsheet data found.

## 2021-01-30 NOTE — Plan of Care (Signed)
Pt orientedx4, remains on RA, NSR on telemetry, VSS. C/o pain addressed with PRN tylenol. Able to ambulate to bathroom several times with standby assistance and walker. Did report dizziness upon standing, but reported in felt more "like being off balance," no reported vision changes or lightheadedness. IV fluids infusing per MAR. Fall/safety precautions in place, rounding performed.   Problem: Education: Goal: Knowledge of General Education information will improve Description: Including pain rating scale, medication(s)/side effects and non-pharmacologic comfort measures Outcome: Progressing   Problem: Health Behavior/Discharge Planning: Goal: Ability to manage health-related needs will improve Outcome: Progressing   Problem: Clinical Measurements: Goal: Ability to maintain clinical measurements within normal limits will improve Outcome: Progressing Goal: Will remain free from infection Outcome: Progressing Goal: Diagnostic test results will improve Outcome: Progressing Goal: Respiratory complications will improve Outcome: Progressing Goal: Cardiovascular complication will be avoided Outcome: Progressing   Problem: Activity: Goal: Risk for activity intolerance will decrease Outcome: Progressing   Problem: Nutrition: Goal: Adequate nutrition will be maintained Outcome: Progressing   Problem: Coping: Goal: Level of anxiety will decrease Outcome: Progressing   Problem: Elimination: Goal: Will not experience complications related to bowel motility Outcome: Progressing Goal: Will not experience complications related to urinary retention Outcome: Progressing   Problem: Pain Managment: Goal: General experience of comfort will improve Outcome: Progressing   Problem: Safety: Goal: Ability to remain free from injury will improve Outcome: Progressing   Problem: Skin Integrity: Goal: Risk for impaired skin integrity will decrease Outcome: Progressing   Problem:  Education: Goal: Knowledge of disease or condition will improve Outcome: Progressing Goal: Knowledge of secondary prevention will improve Outcome: Progressing Goal: Knowledge of patient specific risk factors addressed and post discharge goals established will improve Outcome: Progressing   Problem: Coping: Goal: Will verbalize positive feelings about self Outcome: Progressing Goal: Will identify appropriate support needs Outcome: Progressing   Problem: Health Behavior/Discharge Planning: Goal: Ability to manage health-related needs will improve Outcome: Progressing   Problem: Self-Care: Goal: Ability to participate in self-care as condition permits will improve Outcome: Progressing   Problem: Self-Care: Goal: Verbalization of feelings and concerns over difficulty with self-care will improve Outcome: Progressing

## 2021-01-30 NOTE — Progress Notes (Signed)
SLP Cancellation Note  Patient Details Name: Kellie Harris MRN: [id number] DOB: 1931-05-26   Cancelled treatment:       Reason Eval/Treat Not Completed: SLP screened, no needs identified, will sign off  Chart reviewed and pt interviewed. At this time, pt doesn't have any acute needs.   [name] B. Rutherford Nail M.S., CCC-SLP, [place] Office [phone]  [name] Rutherford Nail 01/30/2021, 9:21 AM

## 2021-01-31 LAB — BASIC METABOLIC PANEL
Anion gap: 5 (ref 5–15)
BUN: 11 mg/dL (ref 8–23)
CO2: 26 mmol/L (ref 22–32)
Calcium: 8.3 mg/dL — ABNORMAL LOW (ref 8.9–10.3)
Chloride: 108 mmol/L (ref 98–111)
Creatinine, Ser: 0.65 mg/dL (ref 0.44–1.00)
GFR, Estimated: >60 mL/min (ref >60)
Glucose, Bld: 93 mg/dL (ref 70–99)
Potassium: 4.1 mmol/L (ref 3.5–5.1)
Sodium: 139 mmol/L (ref 135–145)

## 2021-01-31 MED ORDER — MECLIZINE HCL 25 MG PO TABS
25.0000 mg | ORAL_TABLET | Freq: Three times a day (TID) | ORAL | Status: DC | PRN
  Filled 2021-01-31 (×2): qty 1

## 2021-01-31 NOTE — TOC Progression Note (Signed)
Transition of Care [facility]) - Progression Note    Patient Details  Name: Kellie Harris MRN: [id number] Date of Birth: Feb 06, 1931  Transition of Care [facility]) CM/SW [place], RN Phone Number: 01/31/2021, 3:34 PM  Clinical Narrative:   [employer] pending authorization for SNF, they cannot authorize ambulance at this time due to patient's ambulatory status.  Architectural technologist have offered beds, patient and daughter choosing facility.  TOC explained that due to patient's ambulatory status and as per [employer], the SNF may not be authorized (but has been submitted for an attempt to place patient for rehab).  Daughter was upset, and states that patient lives alone and she lives at a distance and cannot assist patient.  Daughter does not have room in her home due to son home from college at this time.  TOC discussed daughter staying with patient for first few nights to ensure safety.  Daughter will evaluate her home situation and discuss further.  TOC contact information given, TOC to follow to discharge.    Expected Discharge Plan: [place] Barriers to Discharge: Continued Medical Work up  Expected Discharge Plan and Services Expected Discharge Plan: [place]   Discharge Planning Services: CM Consult Post Acute Care Choice: Nursing Home (SNF if patient qualifed) Living arrangements for the past 2 months: Single Family Home                                       Social Determinants of Health (SDOH) Interventions    Readmission Risk Interventions No flowsheet data found.

## 2021-01-31 NOTE — Progress Notes (Signed)
OT Cancellation Note  Patient Details Name: ZOIE SARIN MRN: [id number] DOB: 22-Feb-1931   Cancelled Treatment:    Reason Eval/Treat Not Completed: Patient declined, no reason specified. Upon attempt, pt in bed, reporting nursing just assisted her back recently and she was fatigued. Politely declines OT but agreeable to re-attempt next date.   Hanley Hays, MPH, MS, OTR/L ascom [phone] 01/31/21, 1:43 PM

## 2021-01-31 NOTE — Progress Notes (Addendum)
Progress Note    Kellie Harris  ZRA:[id number] DOB: Jul 14, 1931  DOA: 01/28/2021 PCP: Dion Body, MD    Brief Narrative:    Medical records reviewed and are as summarized below:  Kellie Harris is an 87 y.o. female with medical history significant for osteoarthritis, hypertension, TIAs, dyslipidemia, degenerative disc disease and IBS, who presented to the emergency room with acute onset of near syncope with dizziness after she finished eating and was getting up.  She was able to lay herself down on the floor and did not fall.  She denied having any similar episodes in the past.  She stated that when she had a stroke she had generalized weakness and aphasia without such symptoms that happened today.  MRI showed area of CVA.  Continues to have dizziness of unclear etiology.  Working on safe d/c plan  Assessment/Plan:   Active Problems:   Dizziness    Acute/subacute posterior right frontal lobe white matter infarction with subsequent dizziness. LDL: 93 -HgbA1c: < 7 - PT/OT: SNF placement - ASA/plavix -carotid negative -echo: Left ventricular ejection fraction, by estimation, is 60 to 65%. The  left ventricle has normal function. The left ventricle has no regional  wall motion abnormalities. Left ventricular diastolic parameters are  consistent with Grade I diastolic  dysfunction (impaired relaxation). -tele sinus- occ fast but regular - neurology consult: Continue dual antiplatelet therapy for 21 days, after extended discussion patient agreed to continue Plavix despite propensity for bruising Persistent dizziness -Unclear etiology, does not correlate with the location of her stroke -Atypical history for BPPV -Vestibular physical therapy evaluation -If persistent consider referral for specialized testing out of vestibular disorders clinic for videonystagmography  essential hypertension. - resume home meds with adjustments for better control  Dizziness -trial of  meclizine -negative vestibular exam by PT -unclear source -does wear hearing aides, canals clear -CTA done in ER to r/o PE- negative  Dyslipidemia. - increase statin - LDL: 93-- goal of <70  Hypokalemia -replete  Mild aortic dilatation to 4 cm in the ascending portion. Recommend annual imaging followup by CTA or MRA.   Family Communication/Anticipated D/C date and plan/Code Status   DVT prophylaxis: Lovenox ordered. Code Status: DNR Called daughter, mailbox full, no answer or way to leave message Disposition Plan: Status is: Inpatient  Remains inpatient appropriate because:Inpatient level of care appropriate due to severity of illness  Dispo: The patient is from: Home              Anticipated d/c is to:  tbd  SNF?              Patient currently is not medically stable to d/c. SNF   Difficult to place patient No         Medical Consultants:   neurology  Subjective:  Did not sleep well last night   Objective:    Vitals:   01/30/21 2002 01/30/21 2330 01/31/21 0335 01/31/21 0821  BP: (!) 151/85 (!) 151/98 (!) 152/83 (!) 138/121  Pulse: 74 64 61 74  Resp: 14 16 16 18   Temp: 97.9 F (36.6 C) 98.1 F (36.7 C) 97.9 F (36.6 C) 97.6 F (36.4 C)  TempSrc:    Oral  SpO2: 100% 92% 100% 99%  Weight:      Height:        Intake/Output Summary (Last 24 hours) at 01/31/2021 1231 Last data filed at 01/31/2021 0950 Gross per 24 hour  Intake 610 ml  Output --  Net  610 ml   Filed Weights   01/28/21 1123 01/28/21 2153  Weight: 44.5 kg 41.4 kg    Exam:   General: Appearance:    Thin female in no acute distress     Lungs:     respirations unlabored  Heart:    Normal heart rate.    MS:   All extremities are intact.    Neurologic:   Awake, alert, oriented x 3. Has hearing aides in         Data Reviewed:   I have personally reviewed following labs and imaging studies:  Labs: Labs show the following:   Basic Metabolic Panel: Recent Labs  Lab  01/28/21 1132 01/29/21 0541 01/31/21 0515  NA 135 140 139  K 3.8 3.4* 4.1  CL 98 108 108  CO2 25 24 26   GLUCOSE 158* 87 93  BUN 13 13 11   CREATININE 0.68 0.84 0.65  CALCIUM 9.8 8.5* 8.3*   GFR Estimated Creatinine Clearance: 30.2 mL/min (by C-G formula based on SCr of 0.65 mg/dL). Liver Function Tests: No results for input(s): AST, ALT, ALKPHOS, BILITOT, PROT, ALBUMIN in the last 168 hours. No results for input(s): LIPASE, AMYLASE in the last 168 hours. No results for input(s): AMMONIA in the last 168 hours. Coagulation profile No results for input(s): INR, PROTIME in the last 168 hours.  CBC: Recent Labs  Lab 01/28/21 1132 01/29/21 0541  WBC 12.4* 7.8  HGB 14.7 12.5  HCT 43.6 37.0  MCV 90.1 89.6  PLT 301 221   Cardiac Enzymes: No results for input(s): CKTOTAL, CKMB, CKMBINDEX, TROPONINI in the last 168 hours. BNP (last 3 results) No results for input(s): PROBNP in the last 8760 hours. CBG: No results for input(s): GLUCAP in the last 168 hours. D-Dimer: No results for input(s): DDIMER in the last 72 hours. Hgb A1c: Recent Labs    01/29/21 0541  HGBA1C 5.1   Lipid Profile: Recent Labs    01/29/21 0541  CHOL 170  HDL 52  LDLCALC 93  TRIG 127  CHOLHDL 3.3   Thyroid function studies: Recent Labs    01/29/21 0541  TSH 1.066   Anemia work up: No results for input(s): VITAMINB12, FOLATE, FERRITIN, TIBC, IRON, RETICCTPCT in the last 72 hours. Sepsis Labs: Recent Labs  Lab 01/28/21 1132 01/29/21 0541  WBC 12.4* 7.8    Microbiology Recent Results (from the past 240 hour(s))  Resp Panel by RT-PCR (Flu A&B, Covid) Nasopharyngeal Swab     Status: None   Collection Time: 01/28/21  4:29 PM   Specimen: Nasopharyngeal Swab; Nasopharyngeal(NP) swabs in vial transport medium  Result Value Ref Range Status   SARS Coronavirus 2 by RT PCR NEGATIVE NEGATIVE Final    Comment: (NOTE) SARS-CoV-2 target nucleic acids are NOT DETECTED.  The SARS-CoV-2 RNA is  generally detectable in upper respiratory specimens during the acute phase of infection. The lowest concentration of SARS-CoV-2 viral copies this assay can detect is 138 copies/mL. A negative result does not preclude SARS-Cov-2 infection and should not be used as the sole basis for treatment or other patient management decisions. A negative result may occur with  improper specimen collection/handling, submission of specimen other than nasopharyngeal swab, presence of viral mutation(s) within the areas targeted by this assay, and inadequate number of viral copies(<138 copies/mL). A negative result must be combined with clinical observations, patient history, and epidemiological information. The expected result is Negative.  Fact Sheet for Patients:  EntrepreneurPulse.com.au  Fact Sheet for Healthcare Providers:  IncredibleEmployment.be  This test is no t yet approved or cleared by the [place] and  has been authorized for detection and/or diagnosis of SARS-CoV-2 by FDA under an Emergency Use Authorization (EUA). This EUA will remain  in effect (meaning this test can be used) for the duration of the COVID-19 declaration under Section 564(b)(1) of the Act, 21 U.S.C.section 360bbb-3(b)(1), unless the authorization is terminated  or revoked sooner.       Influenza A by PCR NEGATIVE NEGATIVE Final   Influenza B by PCR NEGATIVE NEGATIVE Final    Comment: (NOTE) The Xpert Xpress SARS-CoV-2/FLU/RSV plus assay is intended as an aid in the diagnosis of influenza from Nasopharyngeal swab specimens and should not be used as a sole basis for treatment. Nasal washings and aspirates are unacceptable for Xpert Xpress SARS-CoV-2/FLU/RSV testing.  Fact Sheet for Patients: EntrepreneurPulse.com.au  Fact Sheet for Healthcare Providers: IncredibleEmployment.be  This test is not yet approved or cleared by the [place] FDA and has been authorized for detection and/or diagnosis of SARS-CoV-2 by FDA under an Emergency Use Authorization (EUA). This EUA will remain in effect (meaning this test can be used) for the duration of the COVID-19 declaration under Section 564(b)(1) of the Act, 21 U.S.C. section 360bbb-3(b)(1), unless the authorization is terminated or revoked.  Performed at [facility], [address] [place], [place] [address]     Procedures and diagnostic studies:  ECHOCARDIOGRAM COMPLETE  Result Date: 01/30/2021    ECHOCARDIOGRAM REPORT   Patient Name:   Kellie Harris Date of Exam: 01/30/2021 Medical Rec #:  [id number]   Height:       57.0 in Accession #:    [id number]  Weight:       91.3 lb Date of Birth:  1930-11-12    BSA:          1.289 m Patient Age:    38 years    BP:           135/89 mmHg Patient Gender: F           HR:           82 bpm. Exam Location:  [facility] Procedure: 2D Echo, Color Doppler and Cardiac Doppler Indications:     Stroke I63.9  History:         Patient has prior history of Echocardiogram examinations, most                  recent 05/04/2020. Risk Factors:Hypertension.  Sonographer:     Sherrie Sport RDCS (AE) Referring Phys:  [place] Diagnosing Phys: Nelva Bush MD  Sonographer Comments: Technically challenging study due to limited acoustic windows and suboptimal apical window. Image acquisition challenging due to patient body habitus. IMPRESSIONS  1. Left ventricular ejection fraction, by estimation, is 60 to 65%. The left ventricle has normal function. The left ventricle has no regional wall motion abnormalities. Left ventricular diastolic parameters are consistent with Grade I diastolic dysfunction (impaired relaxation).  2. Right ventricular systolic function is low normal. The right ventricular size is normal. There is normal pulmonary artery systolic pressure.  3. The mitral valve is abnormal. Trivial mitral valve regurgitation. No evidence of mitral  stenosis.  4. The aortic valve is tricuspid. There is moderate calcification of the aortic valve. There is moderate thickening of the aortic valve. Aortic valve regurgitation is mild. Mild to moderate aortic valve sclerosis/calcification is present, without any evidence of aortic stenosis.  5. The inferior  vena cava is normal in size with <50% respiratory variability, suggesting right atrial pressure of 8 mmHg. FINDINGS  Left Ventricle: Left ventricular ejection fraction, by estimation, is 60 to 65%. The left ventricle has normal function. The left ventricle has no regional wall motion abnormalities. The left ventricular internal cavity size was normal in size. There is  borderline left ventricular hypertrophy. Left ventricular diastolic parameters are consistent with Grade I diastolic dysfunction (impaired relaxation). Right Ventricle: The right ventricular size is normal. No increase in right ventricular wall thickness. Right ventricular systolic function is low normal. There is normal pulmonary artery systolic pressure. The tricuspid regurgitant velocity is 2.16 m/s,  and with an assumed right atrial pressure of 8 mmHg, the estimated right ventricular systolic pressure is 47.4 mmHg. Left Atrium: Left atrial size was normal in size. Right Atrium: Right atrial size was normal in size. Pericardium: There is no evidence of pericardial effusion. Mitral Valve: The mitral valve is abnormal. Mild mitral annular calcification. Trivial mitral valve regurgitation. No evidence of mitral valve stenosis. Tricuspid Valve: The tricuspid valve is not well visualized. Tricuspid valve regurgitation is mild. Aortic Valve: The aortic valve is tricuspid. There is moderate calcification of the aortic valve. There is moderate thickening of the aortic valve. Aortic valve regurgitation is mild. Mild to moderate aortic valve sclerosis/calcification is present, without any evidence of aortic stenosis. Aortic valve mean gradient measures 5.0  mmHg. Aortic valve peak gradient measures 7.4 mmHg. Pulmonic Valve: The pulmonic valve was grossly normal. Pulmonic valve regurgitation is not visualized. No evidence of pulmonic stenosis. Aorta: The aortic root is normal in size and structure. Pulmonary Artery: The pulmonary artery is not well seen. Venous: The inferior vena cava is normal in size with less than 50% respiratory variability, suggesting right atrial pressure of 8 mmHg. IAS/Shunts: The interatrial septum was not well visualized.  LEFT VENTRICLE PLAX 2D LVIDd:         4.18 cm Diastology LVIDs:         2.36 cm LV e' medial:    5.44 cm/s LV PW:         1.03 cm LV E/e' medial:  13.0 LV IVS:        0.69 cm LV e' lateral:   7.51 cm/s                        LV E/e' lateral: 9.4  RIGHT VENTRICLE RV Basal diam:  2.11 cm RV S prime:     10.70 cm/s TAPSE (M-mode): 1.4 cm LEFT ATRIUM             Index       RIGHT ATRIUM           Index LA diam:        3.00 cm 2.33 cm/m  RA Area:     11.60 cm LA Vol (A2C):   17.8 ml 13.81 ml/m RA Volume:   23.50 ml  18.24 ml/m LA Vol (A4C):   25.2 ml 19.55 ml/m LA Biplane Vol: 23.2 ml 18.00 ml/m  AORTIC VALVE                    PULMONIC VALVE AV Vmax:           136.00 cm/s  PV Vmax:        0.50 m/s AV Vmean:          104.000 cm/s PV Peak grad:   1.0 mmHg AV VTI:  0.251 m      RVOT Peak grad: 2 mmHg AV Peak Grad:      7.4 mmHg AV Mean Grad:      5.0 mmHg LVOT Vmax:         92.70 cm/s LVOT Vmean:        57.800 cm/s LVOT VTI:          0.165 m LVOT/AV VTI ratio: 0.66  AORTA Ao Root diam: 3.00 cm MITRAL VALVE                TRICUSPID VALVE MV Area (PHT): 3.19 cm     TR Peak grad:   18.7 mmHg MV Decel Time: 238 msec     TR Vmax:        216.00 cm/s MV E velocity: 70.70 cm/s MV A velocity: 114.00 cm/s  SHUNTS MV E/A ratio:  0.62         Systemic VTI: 0.16 m Nelva Bush MD Electronically signed by Nelva Bush MD Signature Date/Time: 01/30/2021/4:36:33 PM    Final     Medications:    amLODipine  10 mg Oral Daily    aspirin EC  81 mg Oral Daily   atorvastatin  40 mg Oral QHS   clopidogrel  75 mg Oral Daily   enoxaparin (LOVENOX) injection  30 mg Subcutaneous Q24H   lisinopril  20 mg Oral BID   Continuous Infusions:     LOS: 3 days   Geradine Girt  [facility]   How to contact the [facility] Attending or Consulting provider [place] or covering provider during after hours [place], for this patient?  Check the care team in [facility] and look for a) attending/consulting [facility] provider listed and b) the [facility] team listed Log into [email or web address] and use [place]'s universal password to access. If you do not have the password, please contact the hospital operator. Locate the [facility] provider you are looking for under [facility] and page to a number that you can be directly reached. If you still have difficulty reaching the provider, please page the [facility] (Director on Call) for the Hospitalists listed on amion for assistance.  01/31/2021, 12:31 PM

## 2021-01-31 NOTE — Plan of Care (Signed)
No acute events overnight. Pt ambulating to bathroom with assistance. C/o pain addressed with PRN tylenol. Able to rest overnight. Fall/safety precautions in place, rounding performed.   Problem: Education: Goal: Knowledge of General Education information will improve Description: Including pain rating scale, medication(s)/side effects and non-pharmacologic comfort measures Outcome: Progressing   Problem: Health Behavior/Discharge Planning: Goal: Ability to manage health-related needs will improve Outcome: Progressing   Problem: Clinical Measurements: Goal: Ability to maintain clinical measurements within normal limits will improve Outcome: Progressing Goal: Will remain free from infection Outcome: Progressing Goal: Diagnostic test results will improve Outcome: Progressing Goal: Respiratory complications will improve Outcome: Progressing Goal: Cardiovascular complication will be avoided Outcome: Progressing   Problem: Activity: Goal: Risk for activity intolerance will decrease Outcome: Progressing   Problem: Nutrition: Goal: Adequate nutrition will be maintained Outcome: Progressing   Problem: Coping: Goal: Level of anxiety will decrease Outcome: Progressing   Problem: Elimination: Goal: Will not experience complications related to bowel motility Outcome: Progressing Goal: Will not experience complications related to urinary retention Outcome: Progressing

## 2021-01-31 NOTE — Progress Notes (Signed)
Physical Therapy Treatment Patient Details Name: Kellie Harris MRN: [id number] DOB: 09-26-1930 Today's Date: 01/31/2021    History of Present Illness Pt is an 87 y/o M admitted on 01/28/21 with c/c of dizziness & near sycope. MRI revealed acute/subacute posterior R frontal lobe white matter infarct. PMH: OA, HTN, TIAs with aphasia, dyslipidemia, DDD, IBS, arthritis, hiatal hernia, R knee fx    PT Comments    Patient alert, agreeable to PT, denied pain. Stated she still feels dizzy with all mobility. The patient was able to perform bed mobility modI today, educated on importance of changing positions and waiting for symptoms to improve before continuing mobility. Sit <> Stand with RW and CGA. She ambulated ~128ft with RW and CGA, HR in 120s consistently throughout, 80s at rest. Pt stated that she still felt as dizzy as compared to yesterday. Pt received medication to address these symptoms prior to PT today. Current recommendation remains appropriate due to pt risk of falls and decline in mobility from PLOF.     Follow Up Recommendations  SNF;Supervision for mobility/OOB;Supervision - Intermittent     Equipment Recommendations  None recommended by PT    Recommendations for Other Services       Precautions / Restrictions      Mobility  Bed Mobility Overal bed mobility: Modified Independent Bed Mobility: Supine to Sit                Transfers Overall transfer level: Needs assistance Equipment used: Rolling walker (2 wheeled) Transfers: Sit to/from Stand Sit to Stand: Min guard            Ambulation/Gait Ambulation/Gait assistance: Min Gaffer (Feet): 150 Feet Assistive device: Rolling walker (2 wheeled) Gait Pattern/deviations: Decreased step length - right;Decreased step length - left;Decreased stride length;Decreased dorsiflexion - right;Decreased dorsiflexion - left Gait velocity: decreased   General Gait Details: pt with dizziness with  ambulation, reported it feels about the same compared to yesterday.   Stairs             Wheelchair Mobility    Modified Rankin (Stroke Patients Only)       Balance Overall balance assessment: Needs assistance Sitting-balance support: Feet supported Sitting balance-Leahy Scale: Good     Standing balance support: Bilateral upper extremity supported;Single extremity supported;During functional activity Standing balance-Leahy Scale: Good Standing balance comment: pt most comfortable with UE support                            Cognition                                              Exercises      General Comments General comments (skin integrity, edema, etc.): HR consistently in 120s with activity      Pertinent Vitals/Pain      Home Living                      Prior Function            PT Goals (current goals can now be found in the care plan section) Progress towards PT goals: Progressing toward goals    Frequency    7X/week      PT Plan Current plan remains appropriate    Co-evaluation  AM-PAC PT "6 Clicks" Mobility   Outcome Measure  Help needed turning from your back to your side while in a flat bed without using bedrails?: None Help needed moving from lying on your back to sitting on the side of a flat bed without using bedrails?: None Help needed moving to and from a bed to a chair (including a wheelchair)?: None Help needed standing up from a chair using your arms (e.g., wheelchair or bedside chair)?: None Help needed to walk in hospital room?: A Little Help needed climbing 3-5 steps with a railing? : A Little 6 Click Score: 22    End of Session Equipment Utilized During Treatment: Gait belt Activity Tolerance: Patient tolerated treatment well Patient left: with call bell/phone within reach;in chair Nurse Communication: Mobility status PT Visit Diagnosis: Unsteadiness on feet  (R26.81);Muscle weakness (generalized) (M62.81);Difficulty in walking, not elsewhere classified (R26.2)     Time: 5749-3552 PT Time Calculation (min) (ACUTE ONLY): 14 min  Charges:  $Therapeutic Exercise: 8-22 mins                     Lieutenant Diego PT, DPT 1:57 PM,01/31/21

## 2021-02-01 ENCOUNTER — Inpatient Hospital Stay: Payer: PPO

## 2021-02-01 DIAGNOSIS — J9601 Acute respiratory failure with hypoxia: Secondary | ICD-10-CM

## 2021-02-01 DIAGNOSIS — I10 Essential (primary) hypertension: Secondary | ICD-10-CM

## 2021-02-01 DIAGNOSIS — I77819 Aortic ectasia, unspecified site: Secondary | ICD-10-CM

## 2021-02-01 DIAGNOSIS — R42 Dizziness and giddiness: Secondary | ICD-10-CM

## 2021-02-01 MED ORDER — METOPROLOL SUCCINATE ER 25 MG PO TB24: 12.5000 mg | ORAL_TABLET | Freq: Every day | ORAL | Status: DC

## 2021-02-01 MED ORDER — METOPROLOL SUCCINATE ER 25 MG PO TB24
12.5000 mg | ORAL_TABLET | Freq: Every day | ORAL | Status: DC
  Administered 2021-02-02 (×2): 12.5 mg via ORAL
  Filled 2021-02-01 (×2): qty 1

## 2021-02-01 MED ORDER — MECLIZINE HCL 25 MG PO TABS
25.0000 mg | ORAL_TABLET | Freq: Three times a day (TID) | ORAL | Status: DC
  Administered 2021-02-01 – 2021-02-02 (×3): 25 mg via ORAL
  Filled 2021-02-01 (×5): qty 1

## 2021-02-01 NOTE — Progress Notes (Signed)
Occupational Therapy Treatment Patient Details Name: Kellie Harris MRN: [id number] DOB: January 11, 1931 Today's Date: 02/01/2021    History of present illness Pt is an 87 y/o M admitted on 01/28/21 with c/c of dizziness & near sycope. MRI revealed acute/subacute posterior R frontal lobe white matter infarct. PMH: OA, HTN, TIAs with aphasia, dyslipidemia, DDD, IBS, arthritis, hiatal hernia, R knee fx   OT comments  Pt seen for OT treatment on this date. Upon arrival to room, pt in bathroom with NT. Pt agreeable to OT tx. Pt currently presents with dizziness and decreased balance and activity tolerance, and requires MIN GUARD for toilet transfers/hygiene, SUPERVISION for standing grooming tasks, and MIN GUARD for functional mobility of short household distances (~60 ft) with RW. Of note, pt with x1 posterior LOB during functional mobility (with pt attributing LOB to dizziness) however pt able to regain balance independently with use of external supports. Of note, HR consistently in 120s with OOB activity (HR briefly 131 during ambulation). Pt subsequently educated in energy conservation strategies including pursed lip breathing, activity pacing (including rate of perceived exertion), and home/routines modifications, with pt verbalizing understanding however requiring MIN verbal cues to initiate rest breaks when fatigued during session. Pt left in bed, with HR 108, and pt in no acute distress. Pt is making good progress toward goals and continues to benefit from skilled OT services to maximize return to PLOF and minimize risk of future falls, injury, caregiver burden, and readmission. Will continue to follow POC. Discharge recommendation remains appropriate d/t pt risk of falls and decline in PLOF.    Follow Up Recommendations  SNF    Equipment Recommendations  None recommended by OT       Precautions / Restrictions Precautions Precautions: Fall Restrictions Weight Bearing Restrictions: No        Mobility Bed Mobility Overal bed mobility: Modified Independent Bed Mobility: Sit to Supine       Sit to supine: Supervision        Transfers Overall transfer level: Needs assistance Equipment used: Rolling walker (2 wheeled)   Sit to Stand: Min guard              Balance Overall balance assessment: Needs assistance Sitting-balance support: Feet supported;No upper extremity supported Sitting balance-Leahy Scale: Good Sitting balance - Comments: good sitting balance at EOb reaching within BOS   Standing balance support: During functional activity;Single extremity supported Standing balance-Leahy Scale: Good Standing balance comment: good standing balance during sit>stand toilet hygiene, with unilateral UE support                           ADL either performed or assessed with clinical judgement   ADL Overall ADL's : Needs assistance/impaired     Grooming: Wash/Brockel hands;Oral care;Supervision/safety;Standing               Lower Body Dressing: Modified independent;Sitting/lateral leans Lower Body Dressing Details (indicate cue type and reason): donning/doffing shoes Toilet Transfer: Min guard;Regular Toilet;RW   Toileting- Clothing Manipulation and Hygiene: Min guard;Sit to/from stand       Functional mobility during ADLs: Min guard (x1 posterior LOB however pt able to self-correct)        Cognition Arousal/Alertness: Awake/alert Behavior During Therapy: WFL for tasks assessed/performed Overall Cognitive Status: Within Functional Limits for tasks assessed  General Comments HR consistently in 120s with activity (MAX 131 during ambulation)    Pertinent Vitals/ Pain       Pain Assessment: No/denies pain   Frequency  Min 1X/week        Progress Toward Goals  OT Goals(current goals can now be found in the care plan section)  Progress towards OT goals: Progressing  toward goals  Acute Rehab OT Goals Patient Stated Goal: get better OT Goal Formulation: With patient Time For Goal Achievement: 02/12/21 Potential to Achieve Goals: Good  Plan Discharge plan remains appropriate;Frequency remains appropriate    Co-evaluation                 AM-PAC OT "6 Clicks" Daily Activity     Outcome Measure   Help from another person eating meals?: None Help from another person taking care of personal grooming?: A Little Help from another person toileting, which includes using toliet, bedpan, or urinal?: A Little Help from another person bathing (including washing, rinsing, drying)?: A Little Help from another person to put on and taking off regular upper body clothing?: A Little Help from another person to put on and taking off regular lower body clothing?: A Little 6 Click Score: 19    End of Session Equipment Utilized During Treatment: Rolling walker;Gait belt  OT Visit Diagnosis: Unsteadiness on feet (R26.81);Muscle weakness (generalized) (M62.81)   Activity Tolerance Patient tolerated treatment well   Patient Left in bed;with call bell/phone within reach;with bed alarm set;with family/visitor present   Nurse Communication Mobility status        Time: 4562-5638 OT Time Calculation (min): 23 min  Charges: OT General Charges $OT Visit: 1 Visit OT Treatments $Self Care/Home Management : 8-22 mins $Therapeutic Activity: 8-22 mins  Fredirick Maudlin, OTR/L [place]

## 2021-02-01 NOTE — Care Management Important Message (Signed)
Important Message  Patient Details  Name: Kellie Harris MRN: [id number] Date of Birth: 01/28/1931   Medicare Important Message Given:  Yes     [name] [name] 02/01/2021, 11:40 AM

## 2021-02-01 NOTE — TOC Progression Note (Signed)
Transition of Care [facility]) - Progression Note    Patient Details  Name: Kellie Harris MRN: [id number] Date of Birth: Jun 14, 1931  Transition of Care [facility]) CM/SW [place], RN Phone Number: 02/01/2021, 4:25 PM  Clinical Narrative:   [employer] denied SNF even after Peer to Peer.  The following home health agencies declined taking patient for PT:  [facility], [facility], and [facility].  [facility] accepted patient for PT only and PT can begin on Monday.  Care team, MD and PT advised of start date.  Patient has walker at home.  Daughter states that she will stay with patient to ensure supervision and has hired [facility] to accompany patient as well.    Expected Discharge Plan: [place] Barriers to Discharge: Continued Medical Work up  Expected Discharge Plan and Services Expected Discharge Plan: [place]   Discharge Planning Services: CM Consult Post Acute Care Choice: Nursing Home (SNF if patient qualifed) Living arrangements for the past 2 months: Single Family Home                                       Social Determinants of Health (SDOH) Interventions    Readmission Risk Interventions No flowsheet data found.

## 2021-02-01 NOTE — Plan of Care (Signed)
End of Shift Summary:  No acute events overnight. Pt ambulating to bathroom with assistance. C/o pain addressed with PRN tylenol. Able to rest overnight. Remained free from falls or injury. Call bell within reach and able to use.   Problem: Education: Goal: Knowledge of General Education information will improve Description: Including pain rating scale, medication(s)/side effects and non-pharmacologic comfort measures Outcome: Progressing   Problem: Health Behavior/Discharge Planning: Goal: Ability to manage health-related needs will improve Outcome: Progressing   Problem: Clinical Measurements: Goal: Ability to maintain clinical measurements within normal limits will improve Outcome: Progressing Goal: Will remain free from infection Outcome: Progressing Goal: Diagnostic test results will improve Outcome: Progressing Goal: Respiratory complications will improve Outcome: Progressing Goal: Cardiovascular complication will be avoided Outcome: Progressing   Problem: Coping: Goal: Level of anxiety will decrease Outcome: Progressing   Problem: Nutrition: Goal: Adequate nutrition will be maintained Outcome: Progressing   Problem: Elimination: Goal: Will not experience complications related to bowel motility Outcome: Progressing Goal: Will not experience complications related to urinary retention Outcome: Progressing   Problem: Pain Managment: Goal: General experience of comfort will improve Outcome: Progressing   Problem: Safety: Goal: Ability to remain free from injury will improve Outcome: Progressing   Problem: Skin Integrity: Goal: Risk for impaired skin integrity will decrease Outcome: Progressing

## 2021-02-01 NOTE — Progress Notes (Signed)
PROGRESS NOTE    Kellie Harris  YDX:[id number] DOB: [birth year] DOA: 01/28/2021 PCP: Dion Body, MD    Brief Narrative:  Kellie Harris is an 87 y.o. female with medical history significant for osteoarthritis, hypertension, TIAs, dyslipidemia, degenerative disc disease and IBS, who presented to the emergency room with acute onset of near syncope with dizziness after she finished eating and was getting up.  She was able to lay herself down on the floor and did not fall.  She denied having any similar episodes in the past.  She stated that when she had a stroke she had generalized weakness and aphasia without such symptoms that happened today.  MRI showed area of CVA.  Continues to have dizziness of unclear etiology.    6/22- pt's 02 dropped to 83%. Still dizzy when moves head downward or gets up too quickly.  Telemetry telemetry sinus tachycardia.  Spoke to daughter she felt patient is mildly anxious and this probably was causing her tachycardia   Consultants:    Procedures:   Antimicrobials:      Subjective: Denies cp or cp.   Objective: Vitals:   02/01/21 0556 02/01/21 0747 02/01/21 1100 02/01/21 1500  BP: (!) 144/91 (!) 147/91 (!) 145/89 (!) 156/98  Pulse: 74 68 71 84  Resp: 17 16    Temp: (!) 97.4 F (36.3 C) 98 F (36.7 C) 97.9 F (36.6 C) 97.8 F (36.6 C)  TempSrc:   Oral Oral  SpO2: 92% 91% 92% 92%  Weight:      Height:        Intake/Output Summary (Last 24 hours) at 02/01/2021 1708 Last data filed at 02/01/2021 1400 Gross per 24 hour  Intake 840 ml  Output --  Net 840 ml   Filed Weights   01/28/21 1123 01/28/21 2153  Weight: 44.5 kg 41.4 kg    Examination:  General exam: nad, sitting in chair Respiratory system: Clear to auscultation. Respiratory effort normal. Cardiovascular system: S1 & S2 heard, Reg, mildly tachy. No gallops or clicks.  Gastrointestinal system: Abdomen is nondistended, soft and nontender.  Normal bowel sounds heard. Central  nervous system: Alert and oriented. Grossly intact Extremities:no edema Skin: Warm [place] Psychiatry: Mood mood & affect appropriate.     Data Reviewed: I have personally reviewed following labs and imaging studies  CBC: Recent Labs  Lab 01/28/21 1132 01/29/21 0541  WBC 12.4* 7.8  HGB 14.7 12.5  HCT 43.6 37.0  MCV 90.1 89.6  PLT 301 720   Basic Metabolic Panel: Recent Labs  Lab 01/28/21 1132 01/29/21 0541 01/31/21 0515  NA 135 140 139  K 3.8 3.4* 4.1  CL 98 108 108  CO2 25 24 26   GLUCOSE 158* 87 93  BUN 13 13 11   CREATININE 0.68 0.84 0.65  CALCIUM 9.8 8.5* 8.3*   GFR: Estimated Creatinine Clearance: 30.2 mL/min (by C-G formula based on SCr of 0.65 mg/dL). Liver Function Tests: No results for input(s): AST, ALT, ALKPHOS, BILITOT, PROT, ALBUMIN in the last 168 hours. No results for input(s): LIPASE, AMYLASE in the last 168 hours. No results for input(s): AMMONIA in the last 168 hours. Coagulation Profile: No results for input(s): INR, PROTIME in the last 168 hours. Cardiac Enzymes: No results for input(s): CKTOTAL, CKMB, CKMBINDEX, TROPONINI in the last 168 hours. BNP (last 3 results) No results for input(s): PROBNP in the last 8760 hours. HbA1C: No results for input(s): HGBA1C in the last 72 hours. CBG: No results for input(s): GLUCAP in the  last 168 hours. Lipid Profile: No results for input(s): CHOL, HDL, LDLCALC, TRIG, CHOLHDL, LDLDIRECT in the last 72 hours. Thyroid Function Tests: No results for input(s): TSH, T4TOTAL, FREET4, T3FREE, THYROIDAB in the last 72 hours. Anemia Panel: No results for input(s): VITAMINB12, FOLATE, FERRITIN, TIBC, IRON, RETICCTPCT in the last 72 hours. Sepsis Labs: No results for input(s): PROCALCITON, LATICACIDVEN in the last 168 hours.  Recent Results (from the past 240 hour(s))  Resp Panel by RT-PCR (Flu A&B, Covid) Nasopharyngeal Swab     Status: None   Collection Time: 01/28/21  4:29 PM   Specimen: Nasopharyngeal Swab;  Nasopharyngeal(NP) swabs in vial transport medium  Result Value Ref Range Status   SARS Coronavirus 2 by RT PCR NEGATIVE NEGATIVE Final    Comment: (NOTE) SARS-CoV-2 target nucleic acids are NOT DETECTED.  The SARS-CoV-2 RNA is generally detectable in upper respiratory specimens during the acute phase of infection. The lowest concentration of SARS-CoV-2 viral copies this assay can detect is 138 copies/mL. A negative result does not preclude SARS-Cov-2 infection and should not be used as the sole basis for treatment or other patient management decisions. A negative result may occur with  improper specimen collection/handling, submission of specimen other than nasopharyngeal swab, presence of viral mutation(s) within the areas targeted by this assay, and inadequate number of viral copies(<138 copies/mL). A negative result must be combined with clinical observations, patient history, and epidemiological information. The expected result is Negative.  Fact Sheet for Patients:  EntrepreneurPulse.com.au  Fact Sheet for Healthcare Providers:  IncredibleEmployment.be  This test is no t yet approved or cleared by the [place] FDA and  has been authorized for detection and/or diagnosis of SARS-CoV-2 by FDA under an Emergency Use Authorization (EUA). This EUA will remain  in effect (meaning this test can be used) for the duration of the COVID-19 declaration under Section 564(b)(1) of the Act, 21 U.S.C.section 360bbb-3(b)(1), unless the authorization is terminated  or revoked sooner.       Influenza A by PCR NEGATIVE NEGATIVE Final   Influenza B by PCR NEGATIVE NEGATIVE Final    Comment: (NOTE) The Xpert Xpress SARS-CoV-2/FLU/RSV plus assay is intended as an aid in the diagnosis of influenza from Nasopharyngeal swab specimens and should not be used as a sole basis for treatment. Nasal washings and aspirates are unacceptable for Xpert Xpress  SARS-CoV-2/FLU/RSV testing.  Fact Sheet for Patients: EntrepreneurPulse.com.au  Fact Sheet for Healthcare Providers: IncredibleEmployment.be  This test is not yet approved or cleared by the [place] FDA and has been authorized for detection and/or diagnosis of SARS-CoV-2 by FDA under an Emergency Use Authorization (EUA). This EUA will remain in effect (meaning this test can be used) for the duration of the COVID-19 declaration under Section 564(b)(1) of the Act, 21 U.S.C. section 360bbb-3(b)(1), unless the authorization is terminated or revoked.  Performed at [facility], [address] [place],  [address]          Radiology Studies: No results found.      Scheduled Meds:  amLODipine  10 mg Oral Daily   aspirin EC  81 mg Oral Daily   atorvastatin  40 mg Oral QHS   clopidogrel  75 mg Oral Daily   enoxaparin (LOVENOX) injection  30 mg Subcutaneous Q24H   lisinopril  20 mg Oral BID   meclizine  25 mg Oral TID   metoprolol succinate  12.5 mg Oral Daily   Continuous Infusions:  Assessment & Plan:   Active Problems:  Dizziness    Acute/subacute posterior right frontal lobe white matter infarction with subsequent dizziness. Goal LDL less than 70 A1c less than 7 Carotid negative Echo normal EF with no regional wall motion abnormality. Telemetry with sinus tachycardia Neurology was consulted-recommended dual antiplatelet therapy for 21 days, after extended discussion patient agreed to continue Plavix despite propensity for bruising Dizziness does not correlate with the location of her stroke Will need vestibular PT evaluation as outpatient and likely referral for specialized testing out of vestibular disorder clinic for videonystagmography    essential hypertension. Has room for improvement.  With her aortic dilatation and ST will start on low dose beta blk   Dizziness Still symptomatic. Will change  meclizine from as needed to 3 times daily dosing Negative vestibular exam by PT CTA negative for PE Will need outpatient f/u with pcp for further management. PT rec. SNF- did peer to peer and MD declined snf auth.  [facility] 1500   Acute hypoxic respiratory failure- 02 sat dropped to 83% on RA.  Unsure if reading is correct when spoke to nsg. Will give IS Ck cxr monitor    Dyslipidemia. Goal LDL <70 Continue statin    Hypokalemia Repleted and stable  Mild aortic dilatation 4 cm in the ascending portion.  Recommend annual imaging followup by CTA or MRA. Add a low-dose beta-blocker since hypertension is not well controlled.  Needs good BP control       DVT prophylaxis: Lovenox Code Status: DNR Family Communication: Updated daughter Disposition Plan: Status is: Inpatient  Remains inpatient appropriate because:Inpatient level of care appropriate due to severity of illness  Dispo: The patient is from: Home              Anticipated d/c is to: Home              Patient currently is not medically stable to d/c.   Difficult to place patient No    Pts 02 sat dropped to 83%, also tachy , and still dizzy. Possible dc in 1-2 days        LOS: 4 days   Time spent: 55 min with >50% on coc , peer to peer, and pt counseling    Nolberto Hanlon, MD [facility] Pager [phone]  If 7PM-7AM, please contact night-coverage 02/01/2021, 5:08 PM

## 2021-02-01 NOTE — Progress Notes (Signed)
Physical Therapy Treatment Patient Details Name: Kellie Harris MRN: [id number] DOB: 1930/09/22 Today's Date: 02/01/2021    History of Present Illness Pt is an 87 y/o M admitted on 01/28/21 with c/c of dizziness & near sycope. MRI revealed acute/subacute posterior R frontal lobe white matter infarct. PMH: OA, HTN, TIAs with aphasia, dyslipidemia, DDD, IBS, arthritis, hiatal hernia, R knee fx    PT Comments    Pt was long sitting in bed upon arriving. On room air however unable to get a good pleth for accurate reading. Attempted changing O2 censor without success. Resting HR 110 bpm that elevated to 130s with ambulation. Pt was able to achieve EOB sitting without physical assistance. She did endorse dizziness upon sitting up that resolves after ~ 1-2 minutes.BP upon sitting up EOB 133/110. Stood to [employer] and easily ambulated 160 ft. CGA at times during gait training for safety. Pt has been denied for rehab at [facility]. Recommend 24/7 assistance at home for safety. Supportive daughter present during session. Would greatly benefit from continued skilled PT to address deficits with strength,balance, activity tolerance, and overall safety with ADLs. Pt does endorse having all necessary equipment already. Acute PT will continue to follow and progress as able per POC. Will plan to address stairs next session and continue to advance all acute goals as appropriate.    Follow Up Recommendations  SNF;Supervision for mobility/OOB;Supervision - Intermittent     Equipment Recommendations  None recommended by PT       Precautions / Restrictions Precautions Precautions: Fall Restrictions Weight Bearing Restrictions: No    Mobility  Bed Mobility Overal bed mobility: Modified Independent Bed Mobility: Supine to Sit;Sit to Supine     Supine to sit: Supervision Sit to supine: Supervision        Transfers Overall transfer level: Needs assistance Equipment used: Rolling walker (2 wheeled) Transfers: Sit to/from  Stand Sit to Stand: Min guard         General transfer comment: Pt was able to stand fromEOb with  CGA.  Ambulation/Gait Ambulation/Gait assistance: Min guard;Supervision Gait Distance (Feet): 200 Feet Assistive device: Rolling walker (2 wheeled) Gait Pattern/deviations: Decreased step length - right;Decreased step length - left;Decreased stride length;Decreased dorsiflexion - right;Decreased dorsiflexion - left Gait velocity: decreased   General Gait Details: pt with dizziness with ambulation, reported it feels about the same compared to yesterday.       Balance Overall balance assessment: Needs assistance Sitting-balance support: Feet supported;No upper extremity supported Sitting balance-Leahy Scale: Good Sitting balance - Comments: good sitting balance at EOb reaching within BOS   Standing balance support: During functional activity;Single extremity supported Standing balance-Leahy Scale: Good Standing balance comment: good standing balance during sit>stand toilet hygiene, with unilateral UE support                            Cognition Arousal/Alertness: Awake/alert Behavior During Therapy: WFL for tasks assessed/performed Overall Cognitive Status: Within Functional Limits for tasks assessed                                 General Comments: Very pleasant lady      Exercises      General Comments General comments (skin integrity, edema, etc.): HR consistently in 120s with activity (MAX 131 during ambulation)      Pertinent Vitals/Pain Pain Assessment: No/denies pain    Home Living  Prior Function            PT Goals (current goals can now be found in the care plan section) Acute Rehab PT Goals Patient Stated Goal: get better Progress towards PT goals: Progressing toward goals    Frequency    7X/week      PT Plan Current plan remains appropriate    Co-evaluation              AM-PAC  PT "6 Clicks" Mobility   Outcome Measure  Help needed turning from your back to your side while in a flat bed without using bedrails?: None Help needed moving from lying on your back to sitting on the side of a flat bed without using bedrails?: None Help needed moving to and from a bed to a chair (including a wheelchair)?: None Help needed standing up from a chair using your arms (e.g., wheelchair or bedside chair)?: None Help needed to walk in hospital room?: A Lot Help needed climbing 3-5 steps with a railing? : A Little 6 Click Score: 21    End of Session Equipment Utilized During Treatment: Gait belt Activity Tolerance: Patient tolerated treatment well Patient left: in bed;with call bell/phone within reach;with family/visitor present;Other (comment) (CM in room) Nurse Communication: Mobility status PT Visit Diagnosis: Unsteadiness on feet (R26.81);Muscle weakness (generalized) (M62.81);Difficulty in walking, not elsewhere classified (R26.2)     Time: 1062-6948 PT Time Calculation (min) (ACUTE ONLY): 44 min  Charges:  $Gait Training: 23-37 mins $Therapeutic Activity: 8-22 mins                     Julaine Fusi PTA 02/01/21, 5:45 PM

## 2021-02-01 NOTE — TOC Progression Note (Signed)
Transition of Care [facility]) - Progression Note    Patient Details  Name: Kellie Harris MRN: [id number] Date of Birth: [birth year]  [facility]) CM/SW [place], RN Phone Number: 02/01/2021, 10:17 AM  Clinical Narrative:   [employer] declined patient SNF stay and stated they would approve home health.  TOC will look for PT services for patient at home.  Daughter aware, will stay with patient overnight to ensure she has supervision.  Daughter states she plans to privately pay a Psychologist, sport and exercise (not clinical) as well.  TOC will follow to discharge.    Expected Discharge Plan: [place] Barriers to Discharge: Continued Medical Work up  Expected Discharge Plan and Services Expected Discharge Plan: [place]   Discharge Planning Services: CM Consult Post Acute Care Choice: Nursing Home (SNF if patient qualifed) Living arrangements for the past 2 months: Single Family Home                                       Social Determinants of Health (SDOH) Interventions    Readmission Risk Interventions No flowsheet data found.

## 2021-02-02 MED ORDER — ASPIRIN 81 MG PO TBEC: 81.0000 mg | DELAYED_RELEASE_TABLET | Freq: Every day | ORAL | 11 refills | Status: AC

## 2021-02-02 MED ORDER — LISINOPRIL 20 MG PO TABS: 20.0000 mg | ORAL_TABLET | Freq: Two times a day (BID) | ORAL | 0 refills | Status: DC

## 2021-02-02 MED ORDER — CLOPIDOGREL BISULFATE 75 MG PO TABS: 75.0000 mg | ORAL_TABLET | Freq: Every day | ORAL | 0 refills | Status: AC

## 2021-02-02 MED ORDER — MECLIZINE HCL 25 MG PO TABS: 25.0000 mg | ORAL_TABLET | Freq: Three times a day (TID) | ORAL | 0 refills | Status: AC

## 2021-02-02 MED ORDER — METOPROLOL SUCCINATE ER 25 MG PO TB24: 12.5000 mg | ORAL_TABLET | Freq: Every day | ORAL | 0 refills | Status: DC

## 2021-02-02 MED ORDER — AMLODIPINE BESYLATE 10 MG PO TABS: 10.0000 mg | ORAL_TABLET | Freq: Every day | ORAL | 0 refills | Status: DC

## 2021-02-02 NOTE — Progress Notes (Signed)
Gave patient discharge instructions and assisted with gathering items for patient. Notified case worker about [facility] requesting notes from patient's hospital stay. Gave phone and fax numbers. Patient is awaiting transportation. Call bed in reach. IV removed to right AC.

## 2021-02-02 NOTE — Discharge Summary (Signed)
Kellie Harris VQM:[id number] DOB: Jul 10, 1931 DOA: 01/28/2021  PCP: Dion Body, MD  Admit date: 01/28/2021 Discharge date: 02/02/2021  Admitted From: home Disposition:  home  Recommendations for Outpatient Follow-up:  Follow up with PCP in 1 week Please obtain BMP/CBC in one week Please follow up neurology Dr. Manuella Ghazi in one week  Home Health:yes    Discharge Condition:Stable CODE STATUS:DNR  Diet recommendation: Heart Healthy  Brief/Interim Summary: Per HPI: Kellie Harris is a 87 y.o. Caucasian female with medical history significant for osteoarthritis, hypertension, TIAs, dyslipidemia, degenerative disc disease and IBS, who presented to the emergency room with acute onset of near syncope with dizziness after she finished eating and was getting up.  She was able to lay herself down on the floor and did not fall.   Upon presentation to the emergency room blood pressure was 182/103 with a heart rate of 108 . She had CT head and Brain MRI with results below. MRI did show area ov CVA. Neurology was consulted.    Acute/subacute posterior right frontal lobe white matter infarction with subsequent dizziness. Goal LDL less than 70 A1c less than 7 Carotid negative Echo normal EF with no regional wall motion abnormality. Telemetry with sinus tachycardia Neurology was consulted-recommended dual antiplatelet therapy for 21 days. Then asa thereafter. Dizziness does not correlate with the location of her stroke Will need vestibular PT evaluation as outpatient and likely referral for specialized testing out of vestibular disorder clinic for videonystagmography     essential hypertension. Improved with addition of beta blk    Dizziness Added meclizine and beta blk (as she was sinus tachy and elevated bp)...> symptoms resolved . Negative vestibular exam by PT CTA negative for PE Will need outpatient f/u with pcp for further management.    Acute hypoxic respiratory failure- 02 sat dropped to  83% on RA. Cxr negative. Appears since pt's fingers were cold unable to get good reading yesterday Today ambulatory 02 sat on RA  is 95%     Dyslipidemia. Goal LDL <70 Continue statin    Hypokalemia Repleted and stable  Mild aortic dilatation 4 cm in the ascending portion. Recommend annual imaging followup by CTA or MRA. On beta blocker Continue good bp control F/u with pcp for further surveillance       Discharge Diagnoses:  Active Problems:   Dizziness    Discharge Instructions  Discharge Instructions     Call MD for:  persistant dizziness or light-headedness   Complete by: As directed    Call MD for:  temperature >100.4   Complete by: As directed    Diet - low sodium heart healthy   Complete by: As directed    Increase activity slowly   Complete by: As directed       Allergies as of 02/02/2021   No Known Allergies      Medication List     STOP taking these medications    acetaminophen 500 MG tablet Commonly known as: TYLENOL   amoxicillin 500 MG capsule Commonly known as: AMOXIL       TAKE these medications    amLODipine 10 MG tablet Commonly known as: NORVASC Take 1 tablet (10 mg total) by mouth daily. Start taking on: February 03, 2021 What changed:  medication strength how much to take   aspirin 81 MG EC tablet Take 1 tablet (81 mg total) by mouth daily. Swallow whole. Start taking on: February 03, 2021   celecoxib 200 MG capsule Commonly known as: CELEBREX  Take 200 mg by mouth daily.   clopidogrel 75 MG tablet Commonly known as: PLAVIX Take 1 tablet (75 mg total) by mouth daily for 19 days. Start taking on: February 03, 2021   lisinopril 20 MG tablet Commonly known as: ZESTRIL Take 1 tablet (20 mg total) by mouth 2 (two) times daily. What changed:  medication strength how much to take   meclizine 25 MG tablet Commonly known as: ANTIVERT Take 1 tablet (25 mg total) by mouth 3 (three) times daily.   metoprolol succinate 25 MG  24 hr tablet Commonly known as: TOPROL-XL Take 0.5 tablets (12.5 mg total) by mouth daily. Start taking on: February 03, 2021   pravastatin 40 MG tablet Commonly known as: PRAVACHOL Take 1 tablet (40 mg total) by mouth daily at 6 PM.   senna-docusate 8.6-50 MG tablet Commonly known as: Senokot-S Take 1 tablet by mouth at bedtime as needed for mild constipation.        Follow-up Information     Dion Body, MD. Go on 02/08/2021.   Specialty: Family Medicine Why: @ 1:30pm Contact information: [address] [place] [address] [phone]         Vladimir Crofts, MD. Go on 02/09/2021.   Specialty: Neurology Why: @ 3pm Contact information: [place] [facility] [place] [address] [phone]                No Known Allergies  Consultations: neurolgoy   Procedures/Studies: CT Head Wo Contrast  Result Date: 01/28/2021 CLINICAL DATA:  Dizziness, nonspecific. EXAM: CT HEAD WITHOUT CONTRAST TECHNIQUE: Contiguous axial images were obtained from the base of the skull through the vertex without intravenous contrast. COMPARISON:  Brain MRI 05/03/2020. FINDINGS: Brain: Mild cerebral and cerebellar atrophy. Chronic cortically based infarct within the left occipital lobe. Chronic lacunar infarct within the mid right frontal lobe. Moderate patchy and ill-defined hypoattenuation within the cerebral white matter, nonspecific but compatible with chronic small vessel ischemic disease. Chronic lacunar infarcts within the bilateral thalami and bilateral cerebellar hemispheres. There is no acute intracranial hemorrhage. No acute demarcated cortical infarct. No extra-axial fluid collection. No evidence of intracranial mass. No midline shift. Vascular: No hyperdense vessel.  Atherosclerotic calcifications. Skull: Normal. Negative for fracture or focal suspicious osseous lesion. Sinuses/Orbits: Visualized orbits show no  acute finding. Mild right sphenoid sinus mucosal thickening. IMPRESSION: No acute intracranial abnormality. Redemonstrated chronic infarcts within the right frontal white matter, left occipital cortex, bilateral thalami and bilateral cerebellar hemispheres. Background mild generalized parenchymal atrophy and moderate cerebral white matter chronic small vessel ischemic disease. Mild right sphenoid sinus mucosal thickening. Electronically Signed   By: Kellie Simmering DO   On: 01/28/2021 12:40   CT Angio Chest PE W and/or Wo Contrast  Result Date: 01/28/2021 CLINICAL DATA:  Recent syncopal episode EXAM: CT ANGIOGRAPHY CHEST WITH CONTRAST TECHNIQUE: Multidetector CT imaging of the chest was performed using the standard protocol during bolus administration of intravenous contrast. Multiplanar CT image reconstructions and MIPs were obtained to evaluate the vascular anatomy. CONTRAST:  50mL OMNIPAQUE IOHEXOL 350 MG/ML SOLN COMPARISON:  Prior CT from 10/14/2013. FINDINGS: Cardiovascular: Thoracic aorta and its branches demonstrate atherosclerotic calcification. No findings to suggest dissection are seen. Mild dilatation to 4 cm is noted in the ascending segment. Mild turbulent flow is noted with incomplete opacification in the descending thoracic aorta. Pulmonary artery demonstrates a normal branching pattern without intraluminal filling defect to suggest pulmonary embolism. Coronary calcifications are seen. No  cardiac enlargement is noted. Mediastinum/Nodes: Thoracic inlet is within normal limits. No sizable hilar or mediastinal adenopathy is noted. The esophagus as visualized is within normal limits. Lungs/Pleura: Lungs are well aerated bilaterally. No focal infiltrate or sizable effusion is seen. No sizable parenchymal nodule is noted. Upper Abdomen: Visualized upper abdomen demonstrates evidence of severe hydronephrosis in the right kidney which has worsened in the interval from a prior exam from 2015 with diffuse  cortical thinning indicating longstanding obstruction. The remainder of the upper abdomen shows diverticular change of the colon. No other focal abnormality is seen. Musculoskeletal: Degenerative changes of the thoracic spine are seen. Stable S-shaped scoliosis of the thoracic spine is noted. No acute compression deformity is seen. Chronic L1 fracture is seen. Review of the MIP images confirms the above findings. IMPRESSION: No evidence of pulmonary emboli. Mild aortic dilatation to 4 cm in the ascending portion. Recommend annual imaging followup by CTA or MRA. This recommendation follows 2010 ACCF/AHA/AATS/ACR/ASA/SCA/SCAI/SIR/STS/SVM Guidelines for the Diagnosis and Management of Patients with Thoracic Aortic Disease. Circulation. 2010; 121: A630-Z601. Aortic aneurysm NOS (ICD10-I71.9) Diverticular change of the colon without diverticulitis. Severe hydronephrosis with cortical thinning on the right progressed in the interval from the prior exam. Aortic Atherosclerosis (ICD10-I70.0). Electronically Signed   By: Inez Catalina M.D.   On: 01/28/2021 17:27   MR BRAIN WO CONTRAST  Result Date: 01/28/2021 CLINICAL DATA:  Neuro deficit, acute, stroke suspected. Episode of near syncope. EXAM: MRI HEAD WITHOUT CONTRAST TECHNIQUE: Multiplanar, multiecho pulse sequences of the brain and surrounding structures were obtained without intravenous contrast. COMPARISON:  CT head without contrast 01/28/2021. MR head without contrast 05/03/2020 FINDINGS: Brain: Focal area of subcortical diffusion abnormality is present in the posterior right frontal lobe on image 35 of series 6 and image 17 of series 7. There is some FLAIR signal associated which is new from the prior study. No other acute infarct is present. Remote infarcts involving the inferior subcortical operculum on the right, bilateral thalami, and bilateral cerebellum are stable. Confluent periventricular T2 hyperintensities bilaterally are similar the prior exam. Remote  infarct of the left occipital pole is stable. Vascular: Flow is present in the major intracranial arteries. Skull and upper cervical spine: Multilevel degenerative changes of the upper cervical spine are stable craniocervical junction is within normal limits. Midline structures are otherwise unremarkable. Sinuses/Orbits: Chronic left maxillary sinus disease again noted. There is some mucosal thickening within anterior left ethmoid air cells. The paranasal sinuses and mastoid air cells are otherwise clear. Bilateral lens replacements are noted. Globes and orbits are otherwise unremarkable. IMPRESSION: 1. New subcortical diffusion abnormality in the posterior right frontal lobe consistent with a small acute/subacute white matter infarct. 2. No other acute intracranial abnormality or significant interval change. 3. Stable atrophy and white matter disease. 4. Multiple other remote infarcts as described are stable. 5. Chronic left maxillary sinus disease. Electronically Signed   By: San Morelle M.D.   On: 01/28/2021 19:15   US Carotid Bilateral (at [facility] and [facility] only)  Result Date: 01/28/2021 CLINICAL DATA:  Acute lacunar infarct, hypertension, syncope, hyperlipidemia EXAM: BILATERAL CAROTID DUPLEX ULTRASOUND TECHNIQUE: Pearline Cables scale imaging, color Doppler and duplex ultrasound were performed of bilateral carotid and vertebral arteries in the neck. COMPARISON:  05/03/2020 FINDINGS: Criteria: Quantification of carotid stenosis is based on velocity parameters that correlate the residual internal carotid diameter with NASCET-based stenosis levels, using the diameter of the distal internal carotid lumen as the denominator for stenosis measurement. The following velocity measurements were obtained:  RIGHT ICA: 74/23 cm/sec CCA: 78/93 cm/sec SYSTOLIC ICA/CCA RATIO:  1.3 ECA: 67 cm/sec LEFT ICA: 92/30 cm/sec CCA: 81/01 cm/sec SYSTOLIC ICA/CCA RATIO:  1.7 ECA: 98 cm/sec RIGHT CAROTID ARTERY: Minimal atheromatous plaque at  the bifurcation unchanged. No significant flow limiting stenosis. RIGHT VERTEBRAL ARTERY:  Normal antegrade flow. LEFT CAROTID ARTERY: Mild atheromatous plaque at the carotid bifurcation. No significant flow-limiting stenosis. LEFT VERTEBRAL ARTERY:  Normal antegrade flow. IMPRESSION: 1. No significant flow-limiting stenosis within either carotid artery. Estimated 0-49% stenosis bilaterally. Stable exam. Electronically Signed   By: Randa Ngo M.D.   On: 01/28/2021 21:11   DG Chest Port 1 View  Result Date: 02/01/2021 CLINICAL DATA:  Dizziness EXAM: PORTABLE CHEST 1 VIEW COMPARISON:  08/24/2017 FINDINGS: Cardiac shadow is mildly prominent but stable. Aortic calcifications are again seen. Lungs are well aerated bilaterally. No focal infiltrate or effusion is seen. No acute bony abnormality is noted. Degenerative S-shaped scoliosis is again noted and stable. IMPRESSION: No acute abnormality noted. Electronically Signed   By: Inez Catalina M.D.   On: 02/01/2021 17:58   ECHOCARDIOGRAM COMPLETE  Result Date: 01/30/2021    ECHOCARDIOGRAM REPORT   Patient Name:   AERIEL BOULAY Creed Date of Exam: 01/30/2021 Medical Rec #:  [id number]   Height:       57.0 in Accession #:    [id number]  Weight:       91.3 lb Date of Birth:  1930/12/26    BSA:          1.289 m Patient Age:    87 years    BP:           135/89 mmHg Patient Gender: F           HR:           82 bpm. Exam Location:  [facility] Procedure: 2D Echo, Color Doppler and Cardiac Doppler Indications:     Stroke I63.9  History:         Patient has prior history of Echocardiogram examinations, most                  recent 05/04/2020. Risk Factors:Hypertension.  Sonographer:     Sherrie Sport RDCS (AE) Referring Phys:  Ruth Diagnosing Phys: Nelva Bush MD  Sonographer Comments: Technically challenging study due to limited acoustic windows and suboptimal apical window. Image acquisition challenging due to patient body habitus. IMPRESSIONS  1. Left ventricular ejection  fraction, by estimation, is 60 to 65%. The left ventricle has normal function. The left ventricle has no regional wall motion abnormalities. Left ventricular diastolic parameters are consistent with Grade I diastolic dysfunction (impaired relaxation).  2. Right ventricular systolic function is low normal. The right ventricular size is normal. There is normal pulmonary artery systolic pressure.  3. The mitral valve is abnormal. Trivial mitral valve regurgitation. No evidence of mitral stenosis.  4. The aortic valve is tricuspid. There is moderate calcification of the aortic valve. There is moderate thickening of the aortic valve. Aortic valve regurgitation is mild. Mild to moderate aortic valve sclerosis/calcification is present, without any evidence of aortic stenosis.  5. The inferior vena cava is normal in size with <50% respiratory variability, suggesting right atrial pressure of 8 mmHg. FINDINGS  Left Ventricle: Left ventricular ejection fraction, by estimation, is 60 to 65%. The left ventricle has normal function. The left ventricle has no regional wall motion abnormalities. The left ventricular internal cavity size was normal in size. There is  borderline left ventricular hypertrophy. Left ventricular diastolic parameters are consistent with Grade I diastolic dysfunction (impaired relaxation). Right Ventricle: The right ventricular size is normal. No increase in right ventricular wall thickness. Right ventricular systolic function is low normal. There is normal pulmonary artery systolic pressure. The tricuspid regurgitant velocity is 2.16 m/s,  and with an assumed right atrial pressure of 8 mmHg, the estimated right ventricular systolic pressure is 55.7 mmHg. Left Atrium: Left atrial size was normal in size. Right Atrium: Right atrial size was normal in size. Pericardium: There is no evidence of pericardial effusion. Mitral Valve: The mitral valve is abnormal. Mild mitral annular calcification. Trivial mitral  valve regurgitation. No evidence of mitral valve stenosis. Tricuspid Valve: The tricuspid valve is not well visualized. Tricuspid valve regurgitation is mild. Aortic Valve: The aortic valve is tricuspid. There is moderate calcification of the aortic valve. There is moderate thickening of the aortic valve. Aortic valve regurgitation is mild. Mild to moderate aortic valve sclerosis/calcification is present, without any evidence of aortic stenosis. Aortic valve mean gradient measures 5.0 mmHg. Aortic valve peak gradient measures 7.4 mmHg. Pulmonic Valve: The pulmonic valve was grossly normal. Pulmonic valve regurgitation is not visualized. No evidence of pulmonic stenosis. Aorta: The aortic root is normal in size and structure. Pulmonary Artery: The pulmonary artery is not well seen. Venous: The inferior vena cava is normal in size with less than 50% respiratory variability, suggesting right atrial pressure of 8 mmHg. IAS/Shunts: The interatrial septum was not well visualized.  LEFT VENTRICLE PLAX 2D LVIDd:         4.18 cm Diastology LVIDs:         2.36 cm LV e' medial:    5.44 cm/s LV PW:         1.03 cm LV E/e' medial:  13.0 LV IVS:        0.69 cm LV e' lateral:   7.51 cm/s                        LV E/e' lateral: 9.4  RIGHT VENTRICLE RV Basal diam:  2.11 cm RV S prime:     10.70 cm/s TAPSE (M-mode): 1.4 cm LEFT ATRIUM             Index       RIGHT ATRIUM           Index LA diam:        3.00 cm 2.33 cm/m  RA Area:     11.60 cm LA Vol (A2C):   17.8 ml 13.81 ml/m RA Volume:   23.50 ml  18.24 ml/m LA Vol (A4C):   25.2 ml 19.55 ml/m LA Biplane Vol: 23.2 ml 18.00 ml/m  AORTIC VALVE                    PULMONIC VALVE AV Vmax:           136.00 cm/s  PV Vmax:        0.50 m/s AV Vmean:          104.000 cm/s PV Peak grad:   1.0 mmHg AV VTI:            0.251 m      RVOT Peak grad: 2 mmHg AV Peak Grad:      7.4 mmHg AV Mean Grad:      5.0 mmHg LVOT Vmax:         92.70 cm/s LVOT Vmean:  57.800 cm/s LVOT VTI:           0.165 m LVOT/AV VTI ratio: 0.66  AORTA Ao Root diam: 3.00 cm MITRAL VALVE                TRICUSPID VALVE MV Area (PHT): 3.19 cm     TR Peak grad:   18.7 mmHg MV Decel Time: 238 msec     TR Vmax:        216.00 cm/s MV E velocity: 70.70 cm/s MV A velocity: 114.00 cm/s  SHUNTS MV E/A ratio:  0.62         Systemic VTI: 0.16 m Nelva Bush MD Electronically signed by Nelva Bush MD Signature Date/Time: 01/30/2021/4:36:33 PM    Final       Subjective:   Discharge Exam: Vitals:   02/02/21 0424 02/02/21 0830  BP: 130/83 (!) 125/92  Pulse: 65 87  Resp: 18 18  Temp: (!) 97.4 F (36.3 C) 97.8 F (36.6 C)  SpO2:  97%   Vitals:   02/01/21 2058 02/02/21 0420 02/02/21 0424 02/02/21 0830  BP:   130/83 (!) 125/92  Pulse: (!) 107 77 65 87  Resp:   18 18  Temp:  98.4 F (36.9 C) (!) 97.4 F (36.3 C) 97.8 F (36.6 C)  TempSrc:  Oral Oral   SpO2: 92% 98%  97%  Weight:      Height:        General: Pt is alert, awake, not in acute distress Cardiovascular: RRR, S1/S2 +, no rubs, no gallops Respiratory: CTA bilaterally, no wheezing, no rhonchi Abdominal: Soft, NT, ND, bowel sounds + Extremities: no edema    The results of significant diagnostics from this hospitalization (including imaging, microbiology, ancillary and laboratory) are listed below for reference.     Microbiology: Recent Results (from the past 240 hour(s))  Resp Panel by RT-PCR (Flu A&B, Covid) Nasopharyngeal Swab     Status: None   Collection Time: 01/28/21  4:29 PM   Specimen: Nasopharyngeal Swab; Nasopharyngeal(NP) swabs in vial transport medium  Result Value Ref Range Status   SARS Coronavirus 2 by RT PCR NEGATIVE NEGATIVE Final    Comment: (NOTE) SARS-CoV-2 target nucleic acids are NOT DETECTED.  The SARS-CoV-2 RNA is generally detectable in upper respiratory specimens during the acute phase of infection. The lowest concentration of SARS-CoV-2 viral copies this assay can detect is 138 copies/mL. A negative  result does not preclude SARS-Cov-2 infection and should not be used as the sole basis for treatment or other patient management decisions. A negative result may occur with  improper specimen collection/handling, submission of specimen other than nasopharyngeal swab, presence of viral mutation(s) within the areas targeted by this assay, and inadequate number of viral copies(<138 copies/mL). A negative result must be combined with clinical observations, patient history, and epidemiological information. The expected result is Negative.  Fact Sheet for Patients:  EntrepreneurPulse.com.au  Fact Sheet for Healthcare Providers:  IncredibleEmployment.be  This test is no t yet approved or cleared by the [place] FDA and  has been authorized for detection and/or diagnosis of SARS-CoV-2 by FDA under an Emergency Use Authorization (EUA). This EUA will remain  in effect (meaning this test can be used) for the duration of the COVID-19 declaration under Section 564(b)(1) of the Act, 21 U.S.C.section 360bbb-3(b)(1), unless the authorization is terminated  or revoked sooner.       Influenza A by PCR NEGATIVE NEGATIVE Final   Influenza B by PCR NEGATIVE NEGATIVE  Final    Comment: (NOTE) The Xpert Xpress SARS-CoV-2/FLU/RSV plus assay is intended as an aid in the diagnosis of influenza from Nasopharyngeal swab specimens and should not be used as a sole basis for treatment. Nasal washings and aspirates are unacceptable for Xpert Xpress SARS-CoV-2/FLU/RSV testing.  Fact Sheet for Patients: EntrepreneurPulse.com.au  Fact Sheet for Healthcare Providers: IncredibleEmployment.be  This test is not yet approved or cleared by the [place] FDA and has been authorized for detection and/or diagnosis of SARS-CoV-2 by FDA under an Emergency Use Authorization (EUA). This EUA will remain in effect (meaning this test can be used)  for the duration of the COVID-19 declaration under Section 564(b)(1) of the Act, 21 U.S.C. section 360bbb-3(b)(1), unless the authorization is terminated or revoked.  Performed at [facility], [address] [place], [place] [address]      Labs: BNP (last 3 results) No results for input(s): BNP in the last 8760 hours. Basic Metabolic Panel: Recent Labs  Lab 01/28/21 1132 01/29/21 0541 01/31/21 0515  NA 135 140 139  K 3.8 3.4* 4.1  CL 98 108 108  CO2 25 24 26   GLUCOSE 158* 87 93  BUN 13 13 11   CREATININE 0.68 0.84 0.65  CALCIUM 9.8 8.5* 8.3*   Liver Function Tests: No results for input(s): AST, ALT, ALKPHOS, BILITOT, PROT, ALBUMIN in the last 168 hours. No results for input(s): LIPASE, AMYLASE in the last 168 hours. No results for input(s): AMMONIA in the last 168 hours. CBC: Recent Labs  Lab 01/28/21 1132 01/29/21 0541  WBC 12.4* 7.8  HGB 14.7 12.5  HCT 43.6 37.0  MCV 90.1 89.6  PLT 301 221   Cardiac Enzymes: No results for input(s): CKTOTAL, CKMB, CKMBINDEX, TROPONINI in the last 168 hours. BNP: Invalid input(s): POCBNP CBG: No results for input(s): GLUCAP in the last 168 hours. D-Dimer No results for input(s): DDIMER in the last 72 hours. Hgb A1c No results for input(s): HGBA1C in the last 72 hours. Lipid Profile No results for input(s): CHOL, HDL, LDLCALC, TRIG, CHOLHDL, LDLDIRECT in the last 72 hours. Thyroid function studies No results for input(s): TSH, T4TOTAL, T3FREE, THYROIDAB in the last 72 hours.  Invalid input(s): FREET3 Anemia work up No results for input(s): VITAMINB12, FOLATE, FERRITIN, TIBC, IRON, RETICCTPCT in the last 72 hours. Urinalysis    Component Value Date/Time   COLORURINE STRAW (A) 01/28/2021 1511   APPEARANCEUR CLEAR (A) 01/28/2021 1511   APPEARANCEUR Clear 07/08/2014 2155   LABSPEC 1.005 01/28/2021 1511   LABSPEC 1.003 07/08/2014 2155   PHURINE 7.0 01/28/2021 1511   GLUCOSEU NEGATIVE 01/28/2021 1511    GLUCOSEU Negative 07/08/2014 2155   HGBUR NEGATIVE 01/28/2021 1511   BILIRUBINUR NEGATIVE 01/28/2021 1511   BILIRUBINUR Negative 07/08/2014 2155   KETONESUR 5 (A) 01/28/2021 1511   PROTEINUR NEGATIVE 01/28/2021 1511   NITRITE NEGATIVE 01/28/2021 1511   LEUKOCYTESUR NEGATIVE 01/28/2021 1511   LEUKOCYTESUR Negative 07/08/2014 2155   Sepsis Labs Invalid input(s): PROCALCITONIN,  WBC,  LACTICIDVEN Microbiology Recent Results (from the past 240 hour(s))  Resp Panel by RT-PCR (Flu A&B, Covid) Nasopharyngeal Swab     Status: None   Collection Time: 01/28/21  4:29 PM   Specimen: Nasopharyngeal Swab; Nasopharyngeal(NP) swabs in vial transport medium  Result Value Ref Range Status   SARS Coronavirus 2 by RT PCR NEGATIVE NEGATIVE Final    Comment: (NOTE) SARS-CoV-2 target nucleic acids are NOT DETECTED.  The SARS-CoV-2 RNA is generally detectable in upper respiratory specimens during the acute  phase of infection. The lowest concentration of SARS-CoV-2 viral copies this assay can detect is 138 copies/mL. A negative result does not preclude SARS-Cov-2 infection and should not be used as the sole basis for treatment or other patient management decisions. A negative result may occur with  improper specimen collection/handling, submission of specimen other than nasopharyngeal swab, presence of viral mutation(s) within the areas targeted by this assay, and inadequate number of viral copies(<138 copies/mL). A negative result must be combined with clinical observations, patient history, and epidemiological information. The expected result is Negative.  Fact Sheet for Patients:  EntrepreneurPulse.com.au  Fact Sheet for Healthcare Providers:  IncredibleEmployment.be  This test is no t yet approved or cleared by the [place] FDA and  has been authorized for detection and/or diagnosis of SARS-CoV-2 by FDA under an Emergency Use Authorization (EUA). This EUA  will remain  in effect (meaning this test can be used) for the duration of the COVID-19 declaration under Section 564(b)(1) of the Act, 21 U.S.C.section 360bbb-3(b)(1), unless the authorization is terminated  or revoked sooner.       Influenza A by PCR NEGATIVE NEGATIVE Final   Influenza B by PCR NEGATIVE NEGATIVE Final    Comment: (NOTE) The Xpert Xpress SARS-CoV-2/FLU/RSV plus assay is intended as an aid in the diagnosis of influenza from Nasopharyngeal swab specimens and should not be used as a sole basis for treatment. Nasal washings and aspirates are unacceptable for Xpert Xpress SARS-CoV-2/FLU/RSV testing.  Fact Sheet for Patients: EntrepreneurPulse.com.au  Fact Sheet for Healthcare Providers: IncredibleEmployment.be  This test is not yet approved or cleared by the [place] FDA and has been authorized for detection and/or diagnosis of SARS-CoV-2 by FDA under an Emergency Use Authorization (EUA). This EUA will remain in effect (meaning this test can be used) for the duration of the COVID-19 declaration under Section 564(b)(1) of the Act, 21 U.S.C. section 360bbb-3(b)(1), unless the authorization is terminated or revoked.  Performed at [facility], [address] [place], [place] [address]      Time coordinating discharge: Over 30 minutes  SIGNED:   Nolberto Hanlon, MD  [facility] 02/02/2021, 12:45 PM Pager   If 7PM-7AM, please contact night-coverage [email or web address] Password [email or web address]

## 2021-02-02 NOTE — Progress Notes (Signed)
SATURATION QUALIFICATIONS: (This note is used to comply with regulatory documentation for home oxygen)  Patient Saturations on Room Air at Rest = 93% on room air  Patient Saturations on Room Air while Ambulating = while ambulating on RA patient maintained O2 level 95%.   Please briefly explain why patient needs home oxygen: Patient ambulated around unit. Oxygen levels maintained 96% on RA with no difficulties. No oxygen needed.

## 2021-02-02 NOTE — TOC Progression Note (Signed)
Transition of Care [facility]) - Progression Note    Patient Details  Name: Kellie Harris MRN: [id number] Date of Birth: [birth year]  Transition of Care [facility]) CM/SW [place], RN Phone Number: 02/02/2021, 1:09 PM  Clinical Narrative:   PT recommendation was SNF, however patient was denied SNF by insurance.  [place] will be provided by [facility] starting Monday as per [place] at [place].   [facility], will provide a private, non clinical assistant for patient, Jolaine Click, confirmsthey will be able to provide this assistant for patient to ensure 24 hour supervision.    O2 sats assessed and patient will not require home O2 at this point.     Expected Discharge Plan: [place] Barriers to Discharge: Continued Medical Work up  Expected Discharge Plan and Services Expected Discharge Plan: [place]   Discharge Planning Services: CM Consult Post Acute Care Choice: Nursing Home (SNF if patient qualifed) Living arrangements for the past 2 months: Single Family Home Expected Discharge Date: 02/02/21                                     Social Determinants of Health (SDOH) Interventions    Readmission Risk Interventions No flowsheet data found.

## 2021-02-03 DIAGNOSIS — K449 Diaphragmatic hernia without obstruction or gangrene: Secondary | ICD-10-CM | POA: Diagnosis not present

## 2021-02-03 DIAGNOSIS — M8580 Other specified disorders of bone density and structure, unspecified site: Secondary | ICD-10-CM | POA: Diagnosis not present

## 2021-02-03 DIAGNOSIS — M19012 Primary osteoarthritis, left shoulder: Secondary | ICD-10-CM | POA: Diagnosis not present

## 2021-02-03 DIAGNOSIS — K589 Irritable bowel syndrome without diarrhea: Secondary | ICD-10-CM | POA: Diagnosis not present

## 2021-02-03 DIAGNOSIS — Z7902 Long term (current) use of antithrombotics/antiplatelets: Secondary | ICD-10-CM | POA: Diagnosis not present

## 2021-02-03 DIAGNOSIS — Z85828 Personal history of other malignant neoplasm of skin: Secondary | ICD-10-CM | POA: Diagnosis not present

## 2021-02-03 DIAGNOSIS — E440 Moderate protein-calorie malnutrition: Secondary | ICD-10-CM | POA: Diagnosis not present

## 2021-02-03 DIAGNOSIS — I10 Essential (primary) hypertension: Secondary | ICD-10-CM | POA: Diagnosis not present

## 2021-02-03 DIAGNOSIS — Z96651 Presence of right artificial knee joint: Secondary | ICD-10-CM | POA: Diagnosis not present

## 2021-02-03 DIAGNOSIS — I69314 Frontal lobe and executive function deficit following cerebral infarction: Secondary | ICD-10-CM | POA: Diagnosis not present

## 2021-02-03 DIAGNOSIS — M5136 Other intervertebral disc degeneration, lumbar region: Secondary | ICD-10-CM | POA: Diagnosis not present

## 2021-02-03 DIAGNOSIS — Z7982 Long term (current) use of aspirin: Secondary | ICD-10-CM | POA: Diagnosis not present

## 2021-02-03 DIAGNOSIS — H9193 Unspecified hearing loss, bilateral: Secondary | ICD-10-CM | POA: Diagnosis not present

## 2021-02-03 DIAGNOSIS — K5790 Diverticulosis of intestine, part unspecified, without perforation or abscess without bleeding: Secondary | ICD-10-CM | POA: Diagnosis not present

## 2021-02-03 DIAGNOSIS — I77810 Thoracic aortic ectasia: Secondary | ICD-10-CM | POA: Diagnosis not present

## 2021-02-03 DIAGNOSIS — E7800 Pure hypercholesterolemia, unspecified: Secondary | ICD-10-CM | POA: Diagnosis not present

## 2021-02-08 DIAGNOSIS — Z8673 Personal history of transient ischemic attack (TIA), and cerebral infarction without residual deficits: Secondary | ICD-10-CM | POA: Diagnosis not present

## 2021-02-08 DIAGNOSIS — I10 Essential (primary) hypertension: Secondary | ICD-10-CM | POA: Diagnosis not present

## 2021-02-08 DIAGNOSIS — I77810 Thoracic aortic ectasia: Secondary | ICD-10-CM | POA: Diagnosis not present

## 2021-02-10 DIAGNOSIS — M19012 Primary osteoarthritis, left shoulder: Secondary | ICD-10-CM | POA: Diagnosis not present

## 2021-02-10 DIAGNOSIS — M5136 Other intervertebral disc degeneration, lumbar region: Secondary | ICD-10-CM | POA: Diagnosis not present

## 2021-02-10 DIAGNOSIS — K449 Diaphragmatic hernia without obstruction or gangrene: Secondary | ICD-10-CM | POA: Diagnosis not present

## 2021-02-10 DIAGNOSIS — I10 Essential (primary) hypertension: Secondary | ICD-10-CM | POA: Diagnosis not present

## 2021-02-10 DIAGNOSIS — E7800 Pure hypercholesterolemia, unspecified: Secondary | ICD-10-CM | POA: Diagnosis not present

## 2021-02-10 DIAGNOSIS — K589 Irritable bowel syndrome without diarrhea: Secondary | ICD-10-CM | POA: Diagnosis not present

## 2021-02-10 DIAGNOSIS — I69314 Frontal lobe and executive function deficit following cerebral infarction: Secondary | ICD-10-CM | POA: Diagnosis not present

## 2021-02-14 DIAGNOSIS — K449 Diaphragmatic hernia without obstruction or gangrene: Secondary | ICD-10-CM | POA: Diagnosis not present

## 2021-02-14 DIAGNOSIS — Z96651 Presence of right artificial knee joint: Secondary | ICD-10-CM | POA: Diagnosis not present

## 2021-02-14 DIAGNOSIS — H9193 Unspecified hearing loss, bilateral: Secondary | ICD-10-CM | POA: Diagnosis not present

## 2021-02-14 DIAGNOSIS — M19012 Primary osteoarthritis, left shoulder: Secondary | ICD-10-CM | POA: Diagnosis not present

## 2021-02-14 DIAGNOSIS — I10 Essential (primary) hypertension: Secondary | ICD-10-CM | POA: Diagnosis not present

## 2021-02-14 DIAGNOSIS — Z85828 Personal history of other malignant neoplasm of skin: Secondary | ICD-10-CM | POA: Diagnosis not present

## 2021-02-14 DIAGNOSIS — Z7902 Long term (current) use of antithrombotics/antiplatelets: Secondary | ICD-10-CM | POA: Diagnosis not present

## 2021-02-14 DIAGNOSIS — I69314 Frontal lobe and executive function deficit following cerebral infarction: Secondary | ICD-10-CM | POA: Diagnosis not present

## 2021-02-14 DIAGNOSIS — M5136 Other intervertebral disc degeneration, lumbar region: Secondary | ICD-10-CM | POA: Diagnosis not present

## 2021-02-14 DIAGNOSIS — K589 Irritable bowel syndrome without diarrhea: Secondary | ICD-10-CM | POA: Diagnosis not present

## 2021-02-14 DIAGNOSIS — Z7982 Long term (current) use of aspirin: Secondary | ICD-10-CM | POA: Diagnosis not present

## 2021-02-14 DIAGNOSIS — E7800 Pure hypercholesterolemia, unspecified: Secondary | ICD-10-CM | POA: Diagnosis not present

## 2021-02-14 DIAGNOSIS — I77810 Thoracic aortic ectasia: Secondary | ICD-10-CM | POA: Diagnosis not present

## 2021-02-14 DIAGNOSIS — K5790 Diverticulosis of intestine, part unspecified, without perforation or abscess without bleeding: Secondary | ICD-10-CM | POA: Diagnosis not present

## 2021-02-14 DIAGNOSIS — E440 Moderate protein-calorie malnutrition: Secondary | ICD-10-CM | POA: Diagnosis not present

## 2021-02-14 DIAGNOSIS — M8580 Other specified disorders of bone density and structure, unspecified site: Secondary | ICD-10-CM | POA: Diagnosis not present

## 2021-03-01 DIAGNOSIS — Z7902 Long term (current) use of antithrombotics/antiplatelets: Secondary | ICD-10-CM | POA: Diagnosis not present

## 2021-03-01 DIAGNOSIS — Z96651 Presence of right artificial knee joint: Secondary | ICD-10-CM | POA: Diagnosis not present

## 2021-03-01 DIAGNOSIS — E440 Moderate protein-calorie malnutrition: Secondary | ICD-10-CM | POA: Diagnosis not present

## 2021-03-01 DIAGNOSIS — K5790 Diverticulosis of intestine, part unspecified, without perforation or abscess without bleeding: Secondary | ICD-10-CM | POA: Diagnosis not present

## 2021-03-01 DIAGNOSIS — I10 Essential (primary) hypertension: Secondary | ICD-10-CM | POA: Diagnosis not present

## 2021-03-01 DIAGNOSIS — I77810 Thoracic aortic ectasia: Secondary | ICD-10-CM | POA: Diagnosis not present

## 2021-03-01 DIAGNOSIS — I69314 Frontal lobe and executive function deficit following cerebral infarction: Secondary | ICD-10-CM | POA: Diagnosis not present

## 2021-03-01 DIAGNOSIS — Z85828 Personal history of other malignant neoplasm of skin: Secondary | ICD-10-CM | POA: Diagnosis not present

## 2021-03-01 DIAGNOSIS — K449 Diaphragmatic hernia without obstruction or gangrene: Secondary | ICD-10-CM | POA: Diagnosis not present

## 2021-03-01 DIAGNOSIS — E7800 Pure hypercholesterolemia, unspecified: Secondary | ICD-10-CM | POA: Diagnosis not present

## 2021-03-01 DIAGNOSIS — M19012 Primary osteoarthritis, left shoulder: Secondary | ICD-10-CM | POA: Diagnosis not present

## 2021-03-01 DIAGNOSIS — M8580 Other specified disorders of bone density and structure, unspecified site: Secondary | ICD-10-CM | POA: Diagnosis not present

## 2021-03-01 DIAGNOSIS — K589 Irritable bowel syndrome without diarrhea: Secondary | ICD-10-CM | POA: Diagnosis not present

## 2021-03-01 DIAGNOSIS — M5136 Other intervertebral disc degeneration, lumbar region: Secondary | ICD-10-CM | POA: Diagnosis not present

## 2021-03-01 DIAGNOSIS — H9193 Unspecified hearing loss, bilateral: Secondary | ICD-10-CM | POA: Diagnosis not present

## 2021-03-01 DIAGNOSIS — Z7982 Long term (current) use of aspirin: Secondary | ICD-10-CM | POA: Diagnosis not present

## 2021-03-15 DIAGNOSIS — I10 Essential (primary) hypertension: Secondary | ICD-10-CM | POA: Diagnosis not present

## 2021-03-15 DIAGNOSIS — R21 Rash and other nonspecific skin eruption: Secondary | ICD-10-CM | POA: Diagnosis not present

## 2021-03-15 DIAGNOSIS — M19071 Primary osteoarthritis, right ankle and foot: Secondary | ICD-10-CM | POA: Diagnosis not present

## 2021-03-15 DIAGNOSIS — S99921A Unspecified injury of right foot, initial encounter: Secondary | ICD-10-CM | POA: Diagnosis not present

## 2021-03-15 DIAGNOSIS — E213 Hyperparathyroidism, unspecified: Secondary | ICD-10-CM | POA: Diagnosis not present

## 2021-03-16 DIAGNOSIS — K449 Diaphragmatic hernia without obstruction or gangrene: Secondary | ICD-10-CM | POA: Diagnosis not present

## 2021-03-16 DIAGNOSIS — Z85828 Personal history of other malignant neoplasm of skin: Secondary | ICD-10-CM | POA: Diagnosis not present

## 2021-03-16 DIAGNOSIS — K5790 Diverticulosis of intestine, part unspecified, without perforation or abscess without bleeding: Secondary | ICD-10-CM | POA: Diagnosis not present

## 2021-03-16 DIAGNOSIS — H9193 Unspecified hearing loss, bilateral: Secondary | ICD-10-CM | POA: Diagnosis not present

## 2021-03-16 DIAGNOSIS — K589 Irritable bowel syndrome without diarrhea: Secondary | ICD-10-CM | POA: Diagnosis not present

## 2021-03-16 DIAGNOSIS — M19012 Primary osteoarthritis, left shoulder: Secondary | ICD-10-CM | POA: Diagnosis not present

## 2021-03-16 DIAGNOSIS — I69314 Frontal lobe and executive function deficit following cerebral infarction: Secondary | ICD-10-CM | POA: Diagnosis not present

## 2021-03-16 DIAGNOSIS — Z96651 Presence of right artificial knee joint: Secondary | ICD-10-CM | POA: Diagnosis not present

## 2021-03-16 DIAGNOSIS — I77810 Thoracic aortic ectasia: Secondary | ICD-10-CM | POA: Diagnosis not present

## 2021-03-16 DIAGNOSIS — M5136 Other intervertebral disc degeneration, lumbar region: Secondary | ICD-10-CM | POA: Diagnosis not present

## 2021-03-16 DIAGNOSIS — Z7902 Long term (current) use of antithrombotics/antiplatelets: Secondary | ICD-10-CM | POA: Diagnosis not present

## 2021-03-16 DIAGNOSIS — E440 Moderate protein-calorie malnutrition: Secondary | ICD-10-CM | POA: Diagnosis not present

## 2021-03-16 DIAGNOSIS — E7800 Pure hypercholesterolemia, unspecified: Secondary | ICD-10-CM | POA: Diagnosis not present

## 2021-03-16 DIAGNOSIS — I10 Essential (primary) hypertension: Secondary | ICD-10-CM | POA: Diagnosis not present

## 2021-03-16 DIAGNOSIS — M8580 Other specified disorders of bone density and structure, unspecified site: Secondary | ICD-10-CM | POA: Diagnosis not present

## 2021-03-16 DIAGNOSIS — Z7982 Long term (current) use of aspirin: Secondary | ICD-10-CM | POA: Diagnosis not present

## 2021-03-27 DIAGNOSIS — Z872 Personal history of diseases of the skin and subcutaneous tissue: Secondary | ICD-10-CM | POA: Diagnosis not present

## 2021-03-27 DIAGNOSIS — Z09 Encounter for follow-up examination after completed treatment for conditions other than malignant neoplasm: Secondary | ICD-10-CM | POA: Diagnosis not present

## 2021-04-05 DIAGNOSIS — M5136 Other intervertebral disc degeneration, lumbar region: Secondary | ICD-10-CM | POA: Diagnosis not present

## 2021-04-05 DIAGNOSIS — K589 Irritable bowel syndrome without diarrhea: Secondary | ICD-10-CM | POA: Diagnosis not present

## 2021-04-05 DIAGNOSIS — Z85828 Personal history of other malignant neoplasm of skin: Secondary | ICD-10-CM | POA: Diagnosis not present

## 2021-04-05 DIAGNOSIS — Z7982 Long term (current) use of aspirin: Secondary | ICD-10-CM | POA: Diagnosis not present

## 2021-04-05 DIAGNOSIS — K449 Diaphragmatic hernia without obstruction or gangrene: Secondary | ICD-10-CM | POA: Diagnosis not present

## 2021-04-05 DIAGNOSIS — E440 Moderate protein-calorie malnutrition: Secondary | ICD-10-CM | POA: Diagnosis not present

## 2021-04-05 DIAGNOSIS — H9193 Unspecified hearing loss, bilateral: Secondary | ICD-10-CM | POA: Diagnosis not present

## 2021-04-05 DIAGNOSIS — I69314 Frontal lobe and executive function deficit following cerebral infarction: Secondary | ICD-10-CM | POA: Diagnosis not present

## 2021-04-05 DIAGNOSIS — Z96651 Presence of right artificial knee joint: Secondary | ICD-10-CM | POA: Diagnosis not present

## 2021-04-05 DIAGNOSIS — Z7902 Long term (current) use of antithrombotics/antiplatelets: Secondary | ICD-10-CM | POA: Diagnosis not present

## 2021-04-05 DIAGNOSIS — M19012 Primary osteoarthritis, left shoulder: Secondary | ICD-10-CM | POA: Diagnosis not present

## 2021-04-05 DIAGNOSIS — I10 Essential (primary) hypertension: Secondary | ICD-10-CM | POA: Diagnosis not present

## 2021-04-05 DIAGNOSIS — E7800 Pure hypercholesterolemia, unspecified: Secondary | ICD-10-CM | POA: Diagnosis not present

## 2021-04-05 DIAGNOSIS — I77810 Thoracic aortic ectasia: Secondary | ICD-10-CM | POA: Diagnosis not present

## 2021-04-05 DIAGNOSIS — K5790 Diverticulosis of intestine, part unspecified, without perforation or abscess without bleeding: Secondary | ICD-10-CM | POA: Diagnosis not present

## 2021-04-05 DIAGNOSIS — M8580 Other specified disorders of bone density and structure, unspecified site: Secondary | ICD-10-CM | POA: Diagnosis not present

## 2021-04-11 DIAGNOSIS — I10 Essential (primary) hypertension: Secondary | ICD-10-CM | POA: Diagnosis not present

## 2021-04-11 DIAGNOSIS — K449 Diaphragmatic hernia without obstruction or gangrene: Secondary | ICD-10-CM | POA: Diagnosis not present

## 2021-04-11 DIAGNOSIS — I69314 Frontal lobe and executive function deficit following cerebral infarction: Secondary | ICD-10-CM | POA: Diagnosis not present

## 2021-04-11 DIAGNOSIS — I69320 Aphasia following cerebral infarction: Secondary | ICD-10-CM | POA: Diagnosis not present

## 2021-04-11 DIAGNOSIS — M1990 Unspecified osteoarthritis, unspecified site: Secondary | ICD-10-CM | POA: Diagnosis not present

## 2021-04-11 DIAGNOSIS — M5136 Other intervertebral disc degeneration, lumbar region: Secondary | ICD-10-CM | POA: Diagnosis not present

## 2021-04-11 DIAGNOSIS — K589 Irritable bowel syndrome without diarrhea: Secondary | ICD-10-CM | POA: Diagnosis not present

## 2021-04-11 DIAGNOSIS — E7800 Pure hypercholesterolemia, unspecified: Secondary | ICD-10-CM | POA: Diagnosis not present

## 2021-04-11 DIAGNOSIS — R269 Unspecified abnormalities of gait and mobility: Secondary | ICD-10-CM | POA: Diagnosis not present

## 2021-04-11 DIAGNOSIS — M19012 Primary osteoarthritis, left shoulder: Secondary | ICD-10-CM | POA: Diagnosis not present

## 2021-04-17 DIAGNOSIS — H9193 Unspecified hearing loss, bilateral: Secondary | ICD-10-CM | POA: Diagnosis not present

## 2021-04-17 DIAGNOSIS — I69314 Frontal lobe and executive function deficit following cerebral infarction: Secondary | ICD-10-CM | POA: Diagnosis not present

## 2021-04-17 DIAGNOSIS — I77810 Thoracic aortic ectasia: Secondary | ICD-10-CM | POA: Diagnosis not present

## 2021-04-17 DIAGNOSIS — K449 Diaphragmatic hernia without obstruction or gangrene: Secondary | ICD-10-CM | POA: Diagnosis not present

## 2021-04-17 DIAGNOSIS — I10 Essential (primary) hypertension: Secondary | ICD-10-CM | POA: Diagnosis not present

## 2021-04-17 DIAGNOSIS — E440 Moderate protein-calorie malnutrition: Secondary | ICD-10-CM | POA: Diagnosis not present

## 2021-04-17 DIAGNOSIS — K589 Irritable bowel syndrome without diarrhea: Secondary | ICD-10-CM | POA: Diagnosis not present

## 2021-04-17 DIAGNOSIS — Z96651 Presence of right artificial knee joint: Secondary | ICD-10-CM | POA: Diagnosis not present

## 2021-04-17 DIAGNOSIS — Z7982 Long term (current) use of aspirin: Secondary | ICD-10-CM | POA: Diagnosis not present

## 2021-04-17 DIAGNOSIS — M19012 Primary osteoarthritis, left shoulder: Secondary | ICD-10-CM | POA: Diagnosis not present

## 2021-04-17 DIAGNOSIS — E7800 Pure hypercholesterolemia, unspecified: Secondary | ICD-10-CM | POA: Diagnosis not present

## 2021-04-17 DIAGNOSIS — M8580 Other specified disorders of bone density and structure, unspecified site: Secondary | ICD-10-CM | POA: Diagnosis not present

## 2021-04-17 DIAGNOSIS — Z85828 Personal history of other malignant neoplasm of skin: Secondary | ICD-10-CM | POA: Diagnosis not present

## 2021-04-17 DIAGNOSIS — M5136 Other intervertebral disc degeneration, lumbar region: Secondary | ICD-10-CM | POA: Diagnosis not present

## 2021-04-17 DIAGNOSIS — K5790 Diverticulosis of intestine, part unspecified, without perforation or abscess without bleeding: Secondary | ICD-10-CM | POA: Diagnosis not present

## 2021-04-17 DIAGNOSIS — Z7902 Long term (current) use of antithrombotics/antiplatelets: Secondary | ICD-10-CM | POA: Diagnosis not present

## 2021-04-28 DIAGNOSIS — M8580 Other specified disorders of bone density and structure, unspecified site: Secondary | ICD-10-CM | POA: Diagnosis not present

## 2021-04-28 DIAGNOSIS — Z7902 Long term (current) use of antithrombotics/antiplatelets: Secondary | ICD-10-CM | POA: Diagnosis not present

## 2021-04-28 DIAGNOSIS — K449 Diaphragmatic hernia without obstruction or gangrene: Secondary | ICD-10-CM | POA: Diagnosis not present

## 2021-04-28 DIAGNOSIS — Z85828 Personal history of other malignant neoplasm of skin: Secondary | ICD-10-CM | POA: Diagnosis not present

## 2021-04-28 DIAGNOSIS — M19012 Primary osteoarthritis, left shoulder: Secondary | ICD-10-CM | POA: Diagnosis not present

## 2021-04-28 DIAGNOSIS — E440 Moderate protein-calorie malnutrition: Secondary | ICD-10-CM | POA: Diagnosis not present

## 2021-04-28 DIAGNOSIS — Z96651 Presence of right artificial knee joint: Secondary | ICD-10-CM | POA: Diagnosis not present

## 2021-04-28 DIAGNOSIS — I77810 Thoracic aortic ectasia: Secondary | ICD-10-CM | POA: Diagnosis not present

## 2021-04-28 DIAGNOSIS — I69314 Frontal lobe and executive function deficit following cerebral infarction: Secondary | ICD-10-CM | POA: Diagnosis not present

## 2021-04-28 DIAGNOSIS — E7800 Pure hypercholesterolemia, unspecified: Secondary | ICD-10-CM | POA: Diagnosis not present

## 2021-04-28 DIAGNOSIS — M5136 Other intervertebral disc degeneration, lumbar region: Secondary | ICD-10-CM | POA: Diagnosis not present

## 2021-04-28 DIAGNOSIS — H9193 Unspecified hearing loss, bilateral: Secondary | ICD-10-CM | POA: Diagnosis not present

## 2021-04-28 DIAGNOSIS — Z7982 Long term (current) use of aspirin: Secondary | ICD-10-CM | POA: Diagnosis not present

## 2021-04-28 DIAGNOSIS — K5790 Diverticulosis of intestine, part unspecified, without perforation or abscess without bleeding: Secondary | ICD-10-CM | POA: Diagnosis not present

## 2021-04-28 DIAGNOSIS — I10 Essential (primary) hypertension: Secondary | ICD-10-CM | POA: Diagnosis not present

## 2021-04-28 DIAGNOSIS — K589 Irritable bowel syndrome without diarrhea: Secondary | ICD-10-CM | POA: Diagnosis not present

## 2021-05-16 DIAGNOSIS — Z7982 Long term (current) use of aspirin: Secondary | ICD-10-CM | POA: Diagnosis not present

## 2021-05-16 DIAGNOSIS — Z85828 Personal history of other malignant neoplasm of skin: Secondary | ICD-10-CM | POA: Diagnosis not present

## 2021-05-16 DIAGNOSIS — I77810 Thoracic aortic ectasia: Secondary | ICD-10-CM | POA: Diagnosis not present

## 2021-05-16 DIAGNOSIS — E440 Moderate protein-calorie malnutrition: Secondary | ICD-10-CM | POA: Diagnosis not present

## 2021-05-16 DIAGNOSIS — M8580 Other specified disorders of bone density and structure, unspecified site: Secondary | ICD-10-CM | POA: Diagnosis not present

## 2021-05-16 DIAGNOSIS — K5790 Diverticulosis of intestine, part unspecified, without perforation or abscess without bleeding: Secondary | ICD-10-CM | POA: Diagnosis not present

## 2021-05-16 DIAGNOSIS — K589 Irritable bowel syndrome without diarrhea: Secondary | ICD-10-CM | POA: Diagnosis not present

## 2021-05-16 DIAGNOSIS — Z96651 Presence of right artificial knee joint: Secondary | ICD-10-CM | POA: Diagnosis not present

## 2021-05-16 DIAGNOSIS — I10 Essential (primary) hypertension: Secondary | ICD-10-CM | POA: Diagnosis not present

## 2021-05-16 DIAGNOSIS — I69314 Frontal lobe and executive function deficit following cerebral infarction: Secondary | ICD-10-CM | POA: Diagnosis not present

## 2021-05-16 DIAGNOSIS — E7800 Pure hypercholesterolemia, unspecified: Secondary | ICD-10-CM | POA: Diagnosis not present

## 2021-05-16 DIAGNOSIS — H9193 Unspecified hearing loss, bilateral: Secondary | ICD-10-CM | POA: Diagnosis not present

## 2021-05-16 DIAGNOSIS — M5136 Other intervertebral disc degeneration, lumbar region: Secondary | ICD-10-CM | POA: Diagnosis not present

## 2021-05-16 DIAGNOSIS — M19012 Primary osteoarthritis, left shoulder: Secondary | ICD-10-CM | POA: Diagnosis not present

## 2021-05-16 DIAGNOSIS — K449 Diaphragmatic hernia without obstruction or gangrene: Secondary | ICD-10-CM | POA: Diagnosis not present

## 2021-05-16 DIAGNOSIS — Z7902 Long term (current) use of antithrombotics/antiplatelets: Secondary | ICD-10-CM | POA: Diagnosis not present

## 2021-05-31 DIAGNOSIS — Z85828 Personal history of other malignant neoplasm of skin: Secondary | ICD-10-CM | POA: Diagnosis not present

## 2021-05-31 DIAGNOSIS — L590 Erythema ab igne [dermatitis ab igne]: Secondary | ICD-10-CM | POA: Diagnosis not present

## 2021-05-31 DIAGNOSIS — D225 Melanocytic nevi of trunk: Secondary | ICD-10-CM | POA: Diagnosis not present

## 2021-05-31 DIAGNOSIS — D485 Neoplasm of uncertain behavior of skin: Secondary | ICD-10-CM | POA: Diagnosis not present

## 2021-05-31 DIAGNOSIS — C44311 Basal cell carcinoma of skin of nose: Secondary | ICD-10-CM | POA: Diagnosis not present

## 2021-05-31 DIAGNOSIS — L570 Actinic keratosis: Secondary | ICD-10-CM | POA: Diagnosis not present

## 2021-05-31 DIAGNOSIS — D2262 Melanocytic nevi of left upper limb, including shoulder: Secondary | ICD-10-CM | POA: Diagnosis not present

## 2021-05-31 DIAGNOSIS — D0439 Carcinoma in situ of skin of other parts of face: Secondary | ICD-10-CM | POA: Diagnosis not present

## 2021-05-31 DIAGNOSIS — X32XXXA Exposure to sunlight, initial encounter: Secondary | ICD-10-CM | POA: Diagnosis not present

## 2021-05-31 DIAGNOSIS — D2261 Melanocytic nevi of right upper limb, including shoulder: Secondary | ICD-10-CM | POA: Diagnosis not present

## 2021-06-02 DIAGNOSIS — K449 Diaphragmatic hernia without obstruction or gangrene: Secondary | ICD-10-CM | POA: Diagnosis not present

## 2021-06-02 DIAGNOSIS — I77810 Thoracic aortic ectasia: Secondary | ICD-10-CM | POA: Diagnosis not present

## 2021-06-02 DIAGNOSIS — I10 Essential (primary) hypertension: Secondary | ICD-10-CM | POA: Diagnosis not present

## 2021-06-02 DIAGNOSIS — E440 Moderate protein-calorie malnutrition: Secondary | ICD-10-CM | POA: Diagnosis not present

## 2021-06-02 DIAGNOSIS — Z85828 Personal history of other malignant neoplasm of skin: Secondary | ICD-10-CM | POA: Diagnosis not present

## 2021-06-02 DIAGNOSIS — K5790 Diverticulosis of intestine, part unspecified, without perforation or abscess without bleeding: Secondary | ICD-10-CM | POA: Diagnosis not present

## 2021-06-02 DIAGNOSIS — K589 Irritable bowel syndrome without diarrhea: Secondary | ICD-10-CM | POA: Diagnosis not present

## 2021-06-02 DIAGNOSIS — E7800 Pure hypercholesterolemia, unspecified: Secondary | ICD-10-CM | POA: Diagnosis not present

## 2021-06-02 DIAGNOSIS — Z7982 Long term (current) use of aspirin: Secondary | ICD-10-CM | POA: Diagnosis not present

## 2021-06-02 DIAGNOSIS — M5136 Other intervertebral disc degeneration, lumbar region: Secondary | ICD-10-CM | POA: Diagnosis not present

## 2021-06-02 DIAGNOSIS — I69314 Frontal lobe and executive function deficit following cerebral infarction: Secondary | ICD-10-CM | POA: Diagnosis not present

## 2021-06-02 DIAGNOSIS — Z96651 Presence of right artificial knee joint: Secondary | ICD-10-CM | POA: Diagnosis not present

## 2021-06-02 DIAGNOSIS — M19012 Primary osteoarthritis, left shoulder: Secondary | ICD-10-CM | POA: Diagnosis not present

## 2021-06-02 DIAGNOSIS — Z7902 Long term (current) use of antithrombotics/antiplatelets: Secondary | ICD-10-CM | POA: Diagnosis not present

## 2021-06-02 DIAGNOSIS — H9193 Unspecified hearing loss, bilateral: Secondary | ICD-10-CM | POA: Diagnosis not present

## 2021-06-02 DIAGNOSIS — M8580 Other specified disorders of bone density and structure, unspecified site: Secondary | ICD-10-CM | POA: Diagnosis not present

## 2021-06-07 DIAGNOSIS — M25511 Pain in right shoulder: Secondary | ICD-10-CM | POA: Diagnosis not present

## 2021-06-07 DIAGNOSIS — R42 Dizziness and giddiness: Secondary | ICD-10-CM | POA: Diagnosis not present

## 2021-06-07 DIAGNOSIS — M25512 Pain in left shoulder: Secondary | ICD-10-CM | POA: Diagnosis not present

## 2021-06-07 DIAGNOSIS — R531 Weakness: Secondary | ICD-10-CM | POA: Diagnosis not present

## 2021-06-07 DIAGNOSIS — Z8673 Personal history of transient ischemic attack (TIA), and cerebral infarction without residual deficits: Secondary | ICD-10-CM | POA: Diagnosis not present

## 2021-06-08 DIAGNOSIS — M19012 Primary osteoarthritis, left shoulder: Secondary | ICD-10-CM | POA: Diagnosis not present

## 2021-06-08 DIAGNOSIS — Z7902 Long term (current) use of antithrombotics/antiplatelets: Secondary | ICD-10-CM | POA: Diagnosis not present

## 2021-06-08 DIAGNOSIS — K589 Irritable bowel syndrome without diarrhea: Secondary | ICD-10-CM | POA: Diagnosis not present

## 2021-06-08 DIAGNOSIS — I77810 Thoracic aortic ectasia: Secondary | ICD-10-CM | POA: Diagnosis not present

## 2021-06-08 DIAGNOSIS — Z85828 Personal history of other malignant neoplasm of skin: Secondary | ICD-10-CM | POA: Diagnosis not present

## 2021-06-08 DIAGNOSIS — K449 Diaphragmatic hernia without obstruction or gangrene: Secondary | ICD-10-CM | POA: Diagnosis not present

## 2021-06-08 DIAGNOSIS — I69314 Frontal lobe and executive function deficit following cerebral infarction: Secondary | ICD-10-CM | POA: Diagnosis not present

## 2021-06-08 DIAGNOSIS — Z7982 Long term (current) use of aspirin: Secondary | ICD-10-CM | POA: Diagnosis not present

## 2021-06-08 DIAGNOSIS — M8580 Other specified disorders of bone density and structure, unspecified site: Secondary | ICD-10-CM | POA: Diagnosis not present

## 2021-06-08 DIAGNOSIS — I10 Essential (primary) hypertension: Secondary | ICD-10-CM | POA: Diagnosis not present

## 2021-06-08 DIAGNOSIS — E7800 Pure hypercholesterolemia, unspecified: Secondary | ICD-10-CM | POA: Diagnosis not present

## 2021-06-08 DIAGNOSIS — Z96651 Presence of right artificial knee joint: Secondary | ICD-10-CM | POA: Diagnosis not present

## 2021-06-08 DIAGNOSIS — H9193 Unspecified hearing loss, bilateral: Secondary | ICD-10-CM | POA: Diagnosis not present

## 2021-06-08 DIAGNOSIS — E440 Moderate protein-calorie malnutrition: Secondary | ICD-10-CM | POA: Diagnosis not present

## 2021-06-08 DIAGNOSIS — M5136 Other intervertebral disc degeneration, lumbar region: Secondary | ICD-10-CM | POA: Diagnosis not present

## 2021-06-08 DIAGNOSIS — K5790 Diverticulosis of intestine, part unspecified, without perforation or abscess without bleeding: Secondary | ICD-10-CM | POA: Diagnosis not present

## 2021-06-09 DIAGNOSIS — M5136 Other intervertebral disc degeneration, lumbar region: Secondary | ICD-10-CM | POA: Diagnosis not present

## 2021-06-09 DIAGNOSIS — I10 Essential (primary) hypertension: Secondary | ICD-10-CM | POA: Diagnosis not present

## 2021-06-09 DIAGNOSIS — I69314 Frontal lobe and executive function deficit following cerebral infarction: Secondary | ICD-10-CM | POA: Diagnosis not present

## 2021-06-09 DIAGNOSIS — M19012 Primary osteoarthritis, left shoulder: Secondary | ICD-10-CM | POA: Diagnosis not present

## 2021-06-09 DIAGNOSIS — E7800 Pure hypercholesterolemia, unspecified: Secondary | ICD-10-CM | POA: Diagnosis not present

## 2021-06-09 DIAGNOSIS — K449 Diaphragmatic hernia without obstruction or gangrene: Secondary | ICD-10-CM | POA: Diagnosis not present

## 2021-06-09 DIAGNOSIS — K589 Irritable bowel syndrome without diarrhea: Secondary | ICD-10-CM | POA: Diagnosis not present

## 2021-06-12 ENCOUNTER — Emergency Department: Payer: PPO

## 2021-06-12 ENCOUNTER — Other Ambulatory Visit: Payer: Self-pay

## 2021-06-12 ENCOUNTER — Observation Stay
Admission: EM | Admit: 2021-06-12 | Discharge: 2021-06-13 | Disposition: A | Discharge status: Home / Self Care | Payer: PPO | Attending: Internal Medicine | Admitting: Internal Medicine

## 2021-06-12 DIAGNOSIS — R569 Unspecified convulsions: Secondary | ICD-10-CM

## 2021-06-12 DIAGNOSIS — R410 Disorientation, unspecified: Secondary | ICD-10-CM | POA: Diagnosis not present

## 2021-06-12 DIAGNOSIS — I6782 Cerebral ischemia: Secondary | ICD-10-CM | POA: Diagnosis not present

## 2021-06-12 DIAGNOSIS — Z20822 Contact with and (suspected) exposure to covid-19: Secondary | ICD-10-CM | POA: Diagnosis not present

## 2021-06-12 DIAGNOSIS — G40909 Epilepsy, unspecified, not intractable, without status epilepticus: Secondary | ICD-10-CM

## 2021-06-12 DIAGNOSIS — I10 Essential (primary) hypertension: Secondary | ICD-10-CM | POA: Diagnosis not present

## 2021-06-12 DIAGNOSIS — R29818 Other symptoms and signs involving the nervous system: Secondary | ICD-10-CM | POA: Diagnosis not present

## 2021-06-12 DIAGNOSIS — J3489 Other specified disorders of nose and nasal sinuses: Secondary | ICD-10-CM | POA: Diagnosis not present

## 2021-06-12 DIAGNOSIS — Z79899 Other long term (current) drug therapy: Secondary | ICD-10-CM | POA: Diagnosis not present

## 2021-06-12 DIAGNOSIS — I639 Cerebral infarction, unspecified: Secondary | ICD-10-CM | POA: Diagnosis not present

## 2021-06-12 DIAGNOSIS — R930 Abnormal findings on diagnostic imaging of skull and head, not elsewhere classified: Secondary | ICD-10-CM | POA: Diagnosis not present

## 2021-06-12 DIAGNOSIS — M6281 Muscle weakness (generalized): Secondary | ICD-10-CM | POA: Diagnosis not present

## 2021-06-12 DIAGNOSIS — R0902 Hypoxemia: Secondary | ICD-10-CM | POA: Diagnosis not present

## 2021-06-12 DIAGNOSIS — R0682 Tachypnea, not elsewhere classified: Secondary | ICD-10-CM | POA: Diagnosis not present

## 2021-06-12 DIAGNOSIS — G319 Degenerative disease of nervous system, unspecified: Secondary | ICD-10-CM | POA: Diagnosis not present

## 2021-06-12 DIAGNOSIS — E785 Hyperlipidemia, unspecified: Secondary | ICD-10-CM | POA: Diagnosis present

## 2021-06-12 DIAGNOSIS — R2689 Other abnormalities of gait and mobility: Secondary | ICD-10-CM | POA: Diagnosis not present

## 2021-06-12 DIAGNOSIS — Z8673 Personal history of transient ischemic attack (TIA), and cerebral infarction without residual deficits: Secondary | ICD-10-CM

## 2021-06-12 DIAGNOSIS — R4701 Aphasia: Principal | ICD-10-CM | POA: Diagnosis present

## 2021-06-12 DIAGNOSIS — G459 Transient cerebral ischemic attack, unspecified: Secondary | ICD-10-CM | POA: Diagnosis not present

## 2021-06-12 LAB — URINALYSIS, COMPLETE (UACMP) WITH MICROSCOPIC
Bilirubin Urine: NEGATIVE
Glucose, UA: NEGATIVE mg/dL
Hgb urine dipstick: NEGATIVE
Ketones, ur: NEGATIVE mg/dL
Nitrite: NEGATIVE
Protein, ur: NEGATIVE mg/dL
Specific Gravity, Urine: 1.004 — ABNORMAL LOW (ref 1.005–1.030)
pH: 7 (ref 5.0–8.0)

## 2021-06-12 LAB — COMPREHENSIVE METABOLIC PANEL
ALT: 12 U/L (ref 0–44)
AST: 20 U/L (ref 15–41)
Albumin: 4.6 g/dL (ref 3.5–5.0)
Alkaline Phosphatase: 70 U/L (ref 38–126)
Anion gap: 8 (ref 5–15)
BUN: 17 mg/dL (ref 8–23)
CO2: 26 mmol/L (ref 22–32)
Calcium: 9.4 mg/dL (ref 8.9–10.3)
Chloride: 101 mmol/L (ref 98–111)
Creatinine, Ser: 0.73 mg/dL (ref 0.44–1.00)
GFR, Estimated: >60 mL/min (ref >60)
Glucose, Bld: 119 mg/dL — ABNORMAL HIGH (ref 70–99)
Potassium: 4.1 mmol/L (ref 3.5–5.1)
Sodium: 135 mmol/L (ref 135–145)
Total Bilirubin: 0.9 mg/dL (ref 0.3–1.2)
Total Protein: 7.5 g/dL (ref 6.5–8.1)

## 2021-06-12 LAB — CBC WITH DIFFERENTIAL/PLATELET
Abs Immature Granulocytes: 0.05 10*3/uL (ref 0.00–0.07)
Basophils Absolute: 0.1 10*3/uL (ref 0.0–0.1)
Basophils Relative: 1 %
Eosinophils Absolute: 0.2 10*3/uL (ref 0.0–0.5)
Eosinophils Relative: 2 %
HCT: 41.4 % (ref 36.0–46.0)
Hemoglobin: 13.8 g/dL (ref 12.0–15.0)
Immature Granulocytes: 0 %
Lymphocytes Relative: 8 %
Lymphs Abs: 0.9 10*3/uL (ref 0.7–4.0)
MCH: 30 pg (ref 26.0–34.0)
MCHC: 33.3 g/dL (ref 30.0–36.0)
MCV: 90 fL (ref 80.0–100.0)
Monocytes Absolute: 0.6 10*3/uL (ref 0.1–1.0)
Monocytes Relative: 5 %
Neutro Abs: 9.7 10*3/uL — ABNORMAL HIGH (ref 1.7–7.7)
Neutrophils Relative %: 84 %
Platelets: 257 10*3/uL (ref 150–400)
RBC: 4.6 MIL/uL (ref 3.87–5.11)
RDW: 14.3 % (ref 11.5–15.5)
WBC: 11.6 10*3/uL — ABNORMAL HIGH (ref 4.0–10.5)
nRBC: 0 % (ref 0.0–0.2)

## 2021-06-12 LAB — PROTIME-INR
INR: 1 (ref 0.8–1.2)
Prothrombin Time: 13.3 seconds (ref 11.4–15.2)

## 2021-06-12 LAB — RESP PANEL BY RT-PCR (FLU A&B, COVID) ARPGX2
Influenza A by PCR: NEGATIVE
Influenza B by PCR: NEGATIVE
SARS Coronavirus 2 by RT PCR: NEGATIVE

## 2021-06-12 LAB — I-STAT CREATININE, ED: Creatinine, Ser: 0.9 mg/dL (ref 0.44–1.00)

## 2021-06-12 LAB — CBG MONITORING, ED: Glucose-Capillary: 119 mg/dL — ABNORMAL HIGH (ref 70–99)

## 2021-06-12 LAB — APTT: aPTT: 32 seconds (ref 24–36)

## 2021-06-12 MED ORDER — ACETAMINOPHEN 160 MG/5ML PO SOLN
650.0000 mg | ORAL | Status: DC | PRN
  Filled 2021-06-12: qty 20.3

## 2021-06-12 MED ORDER — ROSUVASTATIN CALCIUM 20 MG PO TABS
40.0000 mg | ORAL_TABLET | Freq: Every day | ORAL | Status: DC
  Filled 2021-06-12 (×2): qty 2

## 2021-06-12 MED ORDER — ACETAMINOPHEN 325 MG PO TABS: 650.0000 mg | ORAL_TABLET | ORAL | Status: DC | PRN

## 2021-06-12 MED ORDER — ASPIRIN 81 MG PO CHEW
81.0000 mg | CHEWABLE_TABLET | Freq: Every day | ORAL | Status: DC
  Administered 2021-06-13: 81 mg via ORAL
  Filled 2021-06-12: qty 1

## 2021-06-12 MED ORDER — SODIUM CHLORIDE 0.9 % IV SOLN: INTRAVENOUS | Status: DC

## 2021-06-12 MED ORDER — LISINOPRIL 10 MG PO TABS: 20.0000 mg | ORAL_TABLET | Freq: Two times a day (BID) | ORAL | Status: DC

## 2021-06-12 MED ORDER — ACETAMINOPHEN 325 MG RE SUPP: 650.0000 mg | RECTAL | Status: DC | PRN

## 2021-06-12 MED ORDER — METOPROLOL SUCCINATE ER 25 MG PO TB24
12.5000 mg | ORAL_TABLET | Freq: Every day | ORAL | Status: DC
  Filled 2021-06-12: qty 0.5

## 2021-06-12 MED ORDER — STROKE: EARLY STAGES OF RECOVERY BOOK: Freq: Once | Status: DC

## 2021-06-12 MED ORDER — HEPARIN SODIUM (PORCINE) 5000 UNIT/ML IJ SOLN
5000.0000 [IU] | Freq: Two times a day (BID) | INTRAMUSCULAR | Status: DC
  Administered 2021-06-13: 5000 [IU] via SUBCUTANEOUS
  Filled 2021-06-12: qty 1

## 2021-06-12 NOTE — ED Triage Notes (Signed)
Per EMS, pt symptoms started around 1615 today. Pt had difficulty speaking. Pt has hx of strokes and seizures in the past. Pt is oriented to self.

## 2021-06-12 NOTE — ED Notes (Signed)
CODE  STROKE  CALLED  TO  CARELINK  

## 2021-06-12 NOTE — ED Provider Notes (Addendum)
[facility] Emergency Department Provider Note  ____________________________________________   Event Date/Time   First MD Initiated Contact with Patient 06/12/21 1716     (approximate)  I have reviewed the triage vital signs and the nursing notes.   HISTORY  Chief Complaint Code Stroke   HPI Kellie Harris is a 87 y.o. female with a past medical history of arthritis, DDD, diverticulosis, HTN, HDL, osteopenia, pancreatic insufficiency and previous CVAs without residual deficits as well as previous episodes of vomiting, aphasia of unclear etiology after undergoing extensive work-up  who presents via EMS for assessment of some word finding difficulties reportedly began around 415 today.  It seems patient pressed her life alert bracelet or necklace around this time.  Patient states that around this time she just had difficulty speaking.  She denies any headache, vision changes, vertigo, chest pain, cough, shortness of breath, abdominal pain, vomiting, diarrhea, burning with urination or recent falls or injuries.  Per daughter at bedside this is very similar to multiple episodes of happened in the past.  She notes that at one point neurologist thought she may be having seizures and was on antiepileptic although she is not currently on any antiepileptics.  Patient states he never lost consciousness and any incontinence.  Patient and daughter feel that she is not back to baseline.          Past Medical History:  Diagnosis Date   Arthritis    right knee, left shoulder/ OSTEO   Cancer ([place])    skin    Degenerative disc disease, lumbar    L4-L5   Diverticulosis    ITIS   History of hiatal hernia    Hydronephrosis    right   Hypercholesteremia    Hypertension    CONTROLLED ON MEDS   IBS (irritable bowel syndrome)    Knee fracture, right 03/05/2015   DIFFICULTY WITH AMBULATION   Osteopenia    Ovarian cyst    Pancreatic insufficiency    Wears hearing aid     bilateral    Patient Active Problem List   Diagnosis Date Noted   Dizziness 01/28/2021   Expressive aphasia 05/03/2020   S/P ORIF (open reduction internal fixation) fracture radius and ulnar fx 04/08/18 04/10/2018   Distal radius and ulna fracture, right    History of CVA (cerebrovascular accident)    Benign essential HTN    Tachycardia    Leukocytosis    Seizures ([employer])    Stroke-like episode ([place]) s/p IV tPA 04/03/2018   Syncope    TIA (transient ischemic attack) 11/04/2017   Malnutrition of moderate degree 08/26/2017   Total knee replacement status 08/07/2017   Closed left hip fracture ([place]) 02/12/2017   HTN (hypertension) 02/12/2017   HLD (hyperlipidemia) 02/12/2017    Past Surgical History:  Procedure Laterality Date   CATARACT EXTRACTION W/PHACO Right 11/02/2015   Procedure: CATARACT EXTRACTION PHACO AND INTRAOCULAR LENS PLACEMENT ([place]);  Surgeon: Leandrew Koyanagi, MD;  Location: [facility] [employer];  Service: Ophthalmology;  Laterality: Right;   CATARACT EXTRACTION W/PHACO Left 07/18/2016   Procedure: CATARACT EXTRACTION PHACO AND INTRAOCULAR LENS PLACEMENT (IOC);  Surgeon: Leandrew Koyanagi, MD;  Location: [place];  Service: Ophthalmology;  Laterality: Left;   COLONOSCOPY     FRACTURE SURGERY Left    hip   history of left hip fracture     INTRAMEDULLARY (IM) NAIL INTERTROCHANTERIC Left 02/12/2017   Procedure: INTRAMEDULLARY (IM) NAIL INTERTROCHANTRIC;  Surgeon: Earnestine Leys, MD;  Location: [facility];  Service: Orthopedics;  Laterality: Left;   kidney stent     OOPHORECTOMY Bilateral 2015   ORIF WRIST FRACTURE Right 04/08/2018   Procedure: OPEN REDUCTION INTERNAL FIXATION (ORIF) WRIST FRACTURE;  Surgeon: Altamese , MD;  Location: [place];  Service: Orthopedics;  Laterality: Right;   TONSILLECTOMY     TOTAL KNEE ARTHROPLASTY Right 08/07/2017   Procedure: TOTAL KNEE ARTHROPLASTY;  Surgeon: Earnestine Leys, MD;  Location: [facility];  Service: Orthopedics;   Laterality: Right;    Prior to Admission medications   Medication Sig Start Date End Date Taking? Authorizing Provider  acetaminophen (TYLENOL) 500 MG tablet Take 2 tablets by mouth every 8 (eight) hours as needed.   Yes [provider]  amLODipine (NORVASC) 5 MG tablet Take 5 mg by mouth daily. 03/13/21  Yes [provider]  lisinopril (ZESTRIL) 20 MG tablet Take 1 tablet by mouth 2 (two) times daily. 05/02/21  Yes [provider]  metoprolol succinate (TOPROL-XL) 25 MG 24 hr tablet Take 0.5 mg by mouth daily. 03/02/21  Yes [provider]  pravastatin (PRAVACHOL) 40 MG tablet Take 1 tablet (40 mg total) by mouth daily at 6 PM. 05/04/20  Yes Sheikh, Omair Latif, DO  pravastatin (PRAVACHOL) 40 MG tablet Take 1 tablet by mouth at bedtime. 06/05/21  Yes [provider]  amLODipine (NORVASC) 10 MG tablet Take 1 tablet (10 mg total) by mouth daily. 02/03/21 03/05/21  Nolberto Hanlon, MD  celecoxib (CELEBREX) 200 MG capsule Take 200 mg by mouth daily. Patient not taking: No sig reported 01/25/21   [provider]  lisinopril (ZESTRIL) 20 MG tablet Take 1 tablet (20 mg total) by mouth 2 (two) times daily. 02/02/21 03/04/21  Nolberto Hanlon, MD  metoprolol succinate (TOPROL-XL) 25 MG 24 hr tablet Take 0.5 tablets (12.5 mg total) by mouth daily. 02/03/21 03/05/21  Nolberto Hanlon, MD  senna-docusate (SENOKOT-S) 8.6-50 MG tablet Take 1 tablet by mouth at bedtime as needed for mild constipation. 05/04/20   Kerney Elbe, DO    Allergies Patient has no known allergies.  Family History  Problem Relation Age of Onset   Breast cancer Mother 39   Leukemia Mother    Stroke Father     Social History Social History   Tobacco Use   Smoking status: Never   Smokeless tobacco: Never  Vaping Use   Vaping Use: Never used  Substance Use Topics   Alcohol use: No   Drug use: No    Review of Systems  Review of Systems  Unable to perform ROS: Mental status change   Constitutional:  Negative for fever.  Respiratory:  Negative for cough.   Cardiovascular:  Negative for chest pain.  Genitourinary:  Negative for dysuria.  Musculoskeletal:  Negative for falls.  Neurological:  Positive for speech change.    Further ROS is limited secondary to patient's aphasia.  ____________________________________________   PHYSICAL EXAM:  VITAL SIGNS: ED Triage Vitals  Enc Vitals Group     BP      Pulse      Resp      Temp      Temp src      SpO2      Weight      Height      Head Circumference      Peak Flow      Pain Score      Pain Loc      Pain Edu?      Excl. in [place]?  Vitals:   06/12/21 1830 06/12/21 1900  BP: (!) 182/94 (!) 165/86  Pulse: (!) 102 78  Resp: (!) 22 (!) 29  Temp:    SpO2: 94% 93%   Physical Exam Vitals and nursing note reviewed.  Constitutional:      General: She is not in acute distress.    Appearance: She is well-developed.  HENT:     Head: Normocephalic.     Right Ear: External ear normal.     Left Ear: External ear normal.     Nose: Nose normal.  Eyes:     Conjunctiva/sclera: Conjunctivae normal.  Cardiovascular:     Rate and Rhythm: Normal rate and regular rhythm.     Heart sounds: No murmur heard. Pulmonary:     Effort: Pulmonary effort is normal. No respiratory distress.     Breath sounds: Normal breath sounds.  Abdominal:     Palpations: Abdomen is soft.     Tenderness: There is no abdominal tenderness.  Musculoskeletal:     Cervical back: Neck supple.     Right lower leg: No edema.     Left lower leg: No edema.  Skin:    General: Skin is warm and [place].  Neurological:     Mental Status: She is alert.     GCS: GCS eye subscore is 4. GCS verbal subscore is 5. GCS motor subscore is 6.     Cranial Nerves: Dysarthria present. No facial asymmetry.  Psychiatric:        Mood and Affect: Mood normal.    No pronator drift.  No finger dysmetria.  Patient has symmetric strength in her bilateral upper and  lower extremities.  Sensation is intact to light touch in all extremities.  Cranial nerves II through XII appear grossly intact although patient does have some expressive aphasia.  She appears to understand this examiner had some difficulty naming a pen and is not oriented to month or year.  Per daughter at bedside this is grossly at baseline. ____________________________________________   LABS (all labs ordered are listed, but only abnormal results are displayed)  Labs Reviewed  COMPREHENSIVE METABOLIC PANEL - Abnormal; Notable for the following components:      Result Value   Glucose, Bld 119 (*)    All other components within normal limits  CBC WITH DIFFERENTIAL/PLATELET - Abnormal; Notable for the following components:   WBC 11.6 (*)    Neutro Abs 9.7 (*)    All other components within normal limits  URINALYSIS, COMPLETE (UACMP) WITH MICROSCOPIC - Abnormal; Notable for the following components:   Color, Urine STRAW (*)    APPearance CLEAR (*)    Specific Gravity, Urine 1.004 (*)    Leukocytes,Ua TRACE (*)    Bacteria, UA RARE (*)    All other components within normal limits  CBG MONITORING, ED - Abnormal; Notable for the following components:   Glucose-Capillary 119 (*)    All other components within normal limits  RESP PANEL BY RT-PCR (FLU A&B, COVID) ARPGX2  PROTIME-INR  APTT  DIFFERENTIAL  I-STAT CREATININE, ED  TROPONIN I (HIGH SENSITIVITY)   ____________________________________________  EKG  EKG shows sinus rhythm with a ventricular rate of 94, normal axis and multiple nonspecific changes in inferior and anterior leads largely unchanged when compared to ECG on 6/24.  Patient is denying any chest pain at this time I have a low suspicion for acute ACS ____________________________________________  RADIOLOGY  ED MD interpretation: CT head without contrast shows no large vessel acute  CVA, hemorrhage, edema, mass or other clear acute process.  There is evidence of multiple  remote lacunar infarcts and some advancement chronic microvascular ischemic disease.    Chest x-ray without clear focal consolidation, large effusion, edema, pneumothorax or other clear acute thoracic process.   Official radiology report(s): DG Chest 2 View  Result Date: 06/12/2021 CLINICAL DATA:  Tachypnea EXAM: CHEST - 2 VIEW COMPARISON:  02/01/2021 moderate sigmoid thoracolumbar FINDINGS: Lungs are well expanded, symmetric, and clear. No pneumothorax or pleural effusion. Cardiac size within normal limits. Pulmonary vascularity is normal. Osseous structures are age-appropriate. Scoliosis again noted. No acute bone abnormality. IMPRESSION: No active cardiopulmonary disease. Electronically Signed   By: Fidela Salisbury M.D.   On: 06/12/2021 19:48   CT HEAD CODE STROKE WO CONTRAST  Result Date: 06/12/2021 CLINICAL DATA:  Code stroke.  Neuro deficit, acute, stroke suspected EXAM: CT HEAD WITHOUT CONTRAST TECHNIQUE: Contiguous axial images were obtained from the base of the skull through the vertex without intravenous contrast. COMPARISON:  January 28, 2021. FINDINGS: Brain: Remote infarcts in the right frontal white matter, right basal ganglia, bilateral thalami and bilateral cerebellar hemispheres. Remote left occipital infarct. Additional moderate to advanced patchy and confluent white matter hypodensities, nonspecific but compatible with chronic microvascular ischemic disease. No evidence of acute large vascular territory infarct or acute hemorrhage. Similar atrophy with ex vacuo ventricular dilation. No hydrocephalus. No mass lesion or midline shift. Vascular: No hyperdense vessel identified. Calcific intracranial atherosclerosis. Skull: No acute fracture. Sinuses/Orbits: Minimal paranasal sinus mucosal thickening. No acute findings in the visualized orbits. Other: No mastoid effusions. ASPECTS [facility] Stroke Program Early CT Score) total score (0-10 with 10 being normal): 10. IMPRESSION: 1. No evidence  of acute large vascular territory infarct or acute hemorrhage. ASPECTS is 10. 2. Multiple remote lacunar infarcts and moderate to advanced chronic microvascular ischemic disease. Code stroke imaging results were communicated on 06/12/2021 at 5:34 pm to provider Dr. Charna Archer via telephone, who verbally acknowledged these results. Electronically Signed   By: Margaretha Sheffield M.D.   On: 06/12/2021 17:36    ____________________________________________   PROCEDURES  Procedure(s) performed (including Critical Care):  .1-3 Lead EKG Interpretation Performed by: Lucrezia Starch, MD Authorized by: Lucrezia Starch, MD     Interpretation: non-specific     ECG rate assessment: normal     Rhythm: sinus rhythm     Conduction: normal     ____________________________________________   INITIAL IMPRESSION / ASSESSMENT AND PLAN / ED COURSE       Patient presents with above-stated history exam for assessment of subacute onset of word finding difficulties.  Afebrile hemodynamically stable on arrival patient afebrile hemodynamically stable although slight hypertensive with a BP of 187/105.[name] Kitchen  Patient made code stroke in triage by Dr. Charna Archer.  On my assessment patient does seem to have some difficulty with word finding and is not oriented.  Daughter at bedside this is very similar to episodes that occurred multiple times in the past.  While certainly possible that this is a repeat episode given patient has had a history of CVA CVAs also in the differential as well as possible seizure, metabolic derangement, and possible mild cephalopathy from acute infectious process i.e. UTI.  Low suspicion for preceding trauma or toxic ingestion.  Chest x-ray without clear focal consolidation, large effusion, edema, pneumothorax or other clear acute thoracic process.  EKG shows sinus rhythm with a ventricular rate of 94, normal axis and multiple nonspecific changes in inferior and anterior leads largely unchanged  when  compared to ECG on 6/24.  Patient is denying any chest pain at this time I have a low suspicion for acute ACS  WBC count of 11.6 without evidence of acute anemia.  CMP shows no significant electrolyte or metabolic derangements.  No evidence of hepatitis or cholestasis.  Influenza and COVID PCR is negative.  Chest x-ray as above obtained due to some intermittent tachypnea, patient is clear lungs bilaterally is no fever is denying any shortness of breath or cough and I have low suspicion for pneumonia.  UA with some trace leukocyte esterase and rare bacteria but no other convincing evidence of a cystitis at this time.  Discussed with on-call neurologist Dr. Cheral Marker who recommended obtaining MR studies and admission to hospital service.  He did discuss patient's presentation with daughter at bedside and patient will not receiving TNKase after this discussion.  Please see his note for further details regarding details regarding this discussion.  MRI studies ordered.  I will admit to medicine service for further evaluation and management.   ____________________________________________   FINAL CLINICAL IMPRESSION(S) / ED DIAGNOSES  Final diagnoses:  Aphasia    Medications  lisinopril (ZESTRIL) tablet 20 mg (has no administration in time range)  metoprolol succinate (TOPROL-XL) 24 hr tablet 12.5 mg (has no administration in time range)     ED Discharge Orders     None        Note:  This document was prepared using Dragon voice recognition software and may include unintentional dictation errors.    Lucrezia Starch, MD 06/12/21 1946    Lucrezia Starch, MD 06/12/21 2007

## 2021-06-12 NOTE — H&P (Signed)
History and Physical    Kellie Harris HBZ:[id number] DOB: 08-24-1930 DOA: 06/12/2021  PCP: Dion Body, MD    Patient coming from:  Home    Chief Complaint:  Aphasia.    HPI: Kellie Harris is a 87 y.o. female seen in ed with complaints of difficulty speaking.  Daughter at bedside, pt recognized her speech difficulty and she called her EMS alert.  Pt states she was resting and then she was trouble speaking. Pt lives alone with caregiver. Pt was last here in June for admission.    Pt has past medical history of arthritis, cancer, diverticulosis, hypertension, hypercholesterolemia, irritable bowel syndrome, hydronephrosis and creatinine insufficiency.   ED Course:  Vitals:   06/12/21 1804 06/12/21 1821 06/12/21 1830 06/12/21 1900  BP: (!) 187/105  (!) 182/94 (!) 165/86  Pulse: 87  (!) 102 78  Resp: 20  (!) 22 (!) 29  Temp: 97.8 F (36.6 C)     TempSrc: Oral     SpO2: 96%  94% 93%  Weight:  43.1 kg    Height:  _0  (1.473 m)    In the emergency room patient is alert oriented to self afebrile hypertensive. Code stroke called and a stat CT head noncontrast done was negative for any acute large vascular territory infarct or acute hemorrhage, with presence of multiple remote lacunar infarct and moderate to advanced vascular ischemic disease changes.  Patient was seen by neurology with recommendations for MRI of the brain echocardiogram and inpatient evaluation and monitoring. CMP today shows a glucose of 119 otherwise normal kidney and liver function test, CBC today shows a white count of 11.6 hemoglobin 13.8 normal platelet count of 257 otherwise normal CBC.  Urinalysis is negative for any glucose hemoglobin or blood. Respiratory panel is negative for flu and COVID.  X-ray chest is negative for any acute cardiopulmonary disease.  Initial EKG shows sinus rhythm 94 with no acute ST-T wave changes. MRI of the brain and MRA of head and neck is pending. In the emergency room  patient given lisinopril, metoprolol neurology consult.  Review of Systems:  Review of Systems  Constitutional: Negative.   HENT: Negative.    Eyes: Negative.   Respiratory: Negative.    Cardiovascular: Negative.   Gastrointestinal: Negative.   Musculoskeletal: Negative.   Skin: Negative.   Neurological:  Positive for speech change. Negative for dizziness, tingling, tremors, focal weakness, seizures and headaches.  All other systems reviewed and are negative.   Past Medical History:  Diagnosis Date   Arthritis    right knee, left shoulder/ OSTEO   Cancer ([place])    skin    Degenerative disc disease, lumbar    L4-L5   Diverticulosis    ITIS   History of hiatal hernia    Hydronephrosis    right   Hypercholesteremia    Hypertension    CONTROLLED ON MEDS   IBS (irritable bowel syndrome)    Knee fracture, right 03/05/2015   DIFFICULTY WITH AMBULATION   Osteopenia    Ovarian cyst    Pancreatic insufficiency    Wears hearing aid    bilateral    Past Surgical History:  Procedure Laterality Date   CATARACT EXTRACTION W/PHACO Right 11/02/2015   Procedure: CATARACT EXTRACTION PHACO AND INTRAOCULAR LENS PLACEMENT ([place]);  Surgeon: Leandrew Koyanagi, MD;  Location: [place];  Service: Ophthalmology;  Laterality: Right;   CATARACT EXTRACTION W/PHACO Left 07/18/2016   Procedure: CATARACT EXTRACTION PHACO AND INTRAOCULAR LENS PLACEMENT (IOC);  Surgeon: Leandrew Koyanagi, MD;  Location: [facility];  Service: Ophthalmology;  Laterality: Left;   COLONOSCOPY     FRACTURE SURGERY Left    hip   history of left hip fracture     INTRAMEDULLARY (IM) NAIL INTERTROCHANTERIC Left 02/12/2017   Procedure: INTRAMEDULLARY (IM) NAIL INTERTROCHANTRIC;  Surgeon: Earnestine Leys, MD;  Location: [facility];  Service: Orthopedics;  Laterality: Left;   kidney stent     OOPHORECTOMY Bilateral 2015   ORIF WRIST FRACTURE Right 04/08/2018   Procedure: OPEN REDUCTION INTERNAL FIXATION  (ORIF) WRIST FRACTURE;  Surgeon: Altamese Riverton, MD;  Location: [facility];  Service: Orthopedics;  Laterality: Right;   TONSILLECTOMY     TOTAL KNEE ARTHROPLASTY Right 08/07/2017   Procedure: TOTAL KNEE ARTHROPLASTY;  Surgeon: Earnestine Leys, MD;  Location: [facility];  Service: Orthopedics;  Laterality: Right;     reports that she has never smoked. She has never used smokeless tobacco. She reports that she does not drink alcohol and does not use drugs.  No Known Allergies  Family History  Problem Relation Age of Onset   Breast cancer Mother 13   Leukemia Mother    Stroke Father     Prior to Admission medications   Medication Sig Start Date End Date Taking? Authorizing Provider  acetaminophen (TYLENOL) 500 MG tablet Take 2 tablets by mouth every 8 (eight) hours as needed.   Yes [provider]  amLODipine (NORVASC) 5 MG tablet Take 5 mg by mouth daily. 03/13/21  Yes [provider]  lisinopril (ZESTRIL) 20 MG tablet Take 1 tablet by mouth 2 (two) times daily. 05/02/21  Yes [provider]  metoprolol succinate (TOPROL-XL) 25 MG 24 hr tablet Take 0.5 mg by mouth daily. 03/02/21  Yes [provider]  pravastatin (PRAVACHOL) 40 MG tablet Take 1 tablet (40 mg total) by mouth daily at 6 PM. 05/04/20  Yes Sheikh, Omair Latif, DO  pravastatin (PRAVACHOL) 40 MG tablet Take 1 tablet by mouth at bedtime. 06/05/21  Yes [provider]  amLODipine (NORVASC) 10 MG tablet Take 1 tablet (10 mg total) by mouth daily. 02/03/21 03/05/21  Nolberto Hanlon, MD  celecoxib (CELEBREX) 200 MG capsule Take 200 mg by mouth daily. Patient not taking: No sig reported 01/25/21   [provider]  lisinopril (ZESTRIL) 20 MG tablet Take 1 tablet (20 mg total) by mouth 2 (two) times daily. 02/02/21 03/04/21  Nolberto Hanlon, MD  metoprolol succinate (TOPROL-XL) 25 MG 24 hr tablet Take 0.5 tablets (12.5 mg total) by mouth daily. 02/03/21 03/05/21  Nolberto Hanlon, MD  senna-docusate (SENOKOT-S)  8.6-50 MG tablet Take 1 tablet by mouth at bedtime as needed for mild constipation. 05/04/20   Raiford Noble [place], DO    Physical Exam: Vitals:   06/12/21 1804 06/12/21 1821 06/12/21 1830 06/12/21 1900  BP: (!) 187/105  (!) 182/94 (!) 165/86  Pulse: 87  (!) 102 78  Resp: 20  (!) 22 (!) 29  Temp: 97.8 F (36.6 C)     TempSrc: Oral     SpO2: 96%  94% 93%  Weight:  43.1 kg    Height:  _0  (1.473 m)     Physical Exam Constitutional:      Appearance: Normal appearance. She is not ill-appearing.  HENT:     Head: Normocephalic and atraumatic.     Right Ear: External ear normal.     Left Ear: External ear normal.     Nose: Nose normal.  Eyes:  Extraocular Movements: Extraocular movements intact.     Pupils: Pupils are equal, round, and reactive to light.  Neck:     Vascular: No carotid bruit.  Cardiovascular:     Rate and Rhythm: Normal rate and regular rhythm.     Pulses: Normal pulses.     Heart sounds: Normal heart sounds.  Pulmonary:     Effort: Pulmonary effort is normal.     Breath sounds: Normal breath sounds.  Abdominal:     General: Bowel sounds are normal. There is no distension.     Palpations: Abdomen is soft. There is no mass.     Tenderness: There is no abdominal tenderness. There is no guarding or rebound.  Musculoskeletal:     Right lower leg: No edema.     Left lower leg: No edema.  Skin:    General: Skin is warm.  Neurological:     General: No focal deficit present.     Mental Status: She is alert and oriented to person, place, and time.     Cranial Nerves: No cranial nerve deficit.     Sensory: No sensory deficit.     Motor: No weakness.     Coordination: Coordination normal.     Gait: Gait normal.     Deep Tendon Reflexes: Reflexes normal.  Psychiatric:        Mood and Affect: Mood normal.        Behavior: Behavior normal.    Labs on Admission: I have personally reviewed following labs and imaging studies  No results for input(s):  CKTOTAL, CKMB, TROPONINI in the last 72 hours. Lab Results  Component Value Date   WBC 11.6 (H) 06/12/2021   HGB 13.8 06/12/2021   HCT 41.4 06/12/2021   MCV 90.0 06/12/2021   PLT 257 06/12/2021    Recent Labs  Lab 06/12/21 1735 06/12/21 1737  NA 135  --   K 4.1  --   CL 101  --   CO2 26  --   BUN 17  --   CREATININE 0.73 0.90  CALCIUM 9.4  --   PROT 7.5  --   BILITOT 0.9  --   ALKPHOS 70  --   ALT 12  --   AST 20  --   GLUCOSE 119*  --    Lab Results  Component Value Date   CHOL 170 01/29/2021   HDL 52 01/29/2021   LDLCALC 93 01/29/2021   TRIG 127 01/29/2021   No results found for: DDIMER Invalid input(s): POCBNP  Urinalysis    Component Value Date/Time   COLORURINE STRAW (A) 06/12/2021 1825   APPEARANCEUR CLEAR (A) 06/12/2021 1825   APPEARANCEUR Clear 07/08/2014 2155   LABSPEC 1.004 (L) 06/12/2021 1825   LABSPEC 1.003 07/08/2014 2155   PHURINE 7.0 06/12/2021 1825   GLUCOSEU NEGATIVE 06/12/2021 1825   GLUCOSEU Negative 07/08/2014 2155   HGBUR NEGATIVE 06/12/2021 1825   BILIRUBINUR NEGATIVE 06/12/2021 1825   BILIRUBINUR Negative 07/08/2014 2155   KETONESUR NEGATIVE 06/12/2021 1825   PROTEINUR NEGATIVE 06/12/2021 1825   NITRITE NEGATIVE 06/12/2021 1825   LEUKOCYTESUR TRACE (A) 06/12/2021 1825   LEUKOCYTESUR Negative 07/08/2014 2155     COVID-19 Labs No results for input(s): DDIMER, FERRITIN, LDH, CRP in the last 72 hours. Lab Results  Component Value Date   SARSCOV2NAA NEGATIVE 06/12/2021   SARSCOV2NAA NEGATIVE 01/28/2021   [place] NEGATIVE 05/03/2020    Radiological Exams on Admission: DG Chest 2 View  Result Date: 06/12/2021 CLINICAL DATA:  Tachypnea EXAM: CHEST - 2 VIEW COMPARISON:  02/01/2021 moderate sigmoid thoracolumbar FINDINGS: Lungs are well expanded, symmetric, and clear. No pneumothorax or pleural effusion. Cardiac size within normal limits. Pulmonary vascularity is normal. Osseous structures are age-appropriate. Scoliosis again  noted. No acute bone abnormality. IMPRESSION: No active cardiopulmonary disease. Electronically Signed   By: Fidela Salisbury M.D.   On: 06/12/2021 19:48   MR ANGIO HEAD WO CONTRAST  Result Date: 06/12/2021 CLINICAL DATA:  Acute neurologic deficit EXAM: MRA HEAD WITHOUT CONTRAST TECHNIQUE: Angiographic images of the Circle of Willis were acquired using MRA technique without intravenous contrast. COMPARISON:  No pertinent prior exam. FINDINGS: POSTERIOR CIRCULATION: --Vertebral arteries: Normal --Inferior cerebellar arteries: Normal. --Basilar artery: Normal. --Superior cerebellar arteries: Normal. --Posterior cerebral arteries: There is a short segment of loss of flow related enhancement within the right PCA proximal P2 segment. More distally, the right PCA is normal. Left PCA is normal. ANTERIOR CIRCULATION: --Intracranial internal carotid arteries: Normal. --Anterior cerebral arteries (ACA): Normal. --Middle cerebral arteries (MCA): Normal. ANATOMIC VARIANTS: None IMPRESSION: 1. Short segment loss of flow related enhancement in the right PCA proximal P2 segment, which may indicate high-grade stenosis or occlusion. More distal right PCA is normal. 2. Otherwise normal intracranial MRA. Electronically Signed   By: Ulyses Jarred M.D.   On: 06/12/2021 21:07   MR ANGIO NECK WO CONTRAST  Result Date: 06/12/2021 CLINICAL DATA:  Acute neurologic deficit EXAM: MRA NECK WITHOUT CONTRAST TECHNIQUE: Angiographic images of the neck were acquired using MRA technique without intravenous contrast. Carotid stenosis measurements (when applicable) are obtained utilizing NASCET criteria, using the distal internal carotid diameter as the denominator. COMPARISON:  No pertinent prior exam. FINDINGS: Severely motion degraded study with limited visibility of the carotid and vertebral arteries. The visible portion of the internal carotid arteries are patent. IMPRESSION: Severely motion degraded study with limited visibility of the  carotid and vertebral arteries. Electronically Signed   By: Ulyses Jarred M.D.   On: 06/12/2021 21:09   MR BRAIN WO CONTRAST  Result Date: 06/12/2021 CLINICAL DATA:  Seizure EXAM: MRI HEAD WITHOUT CONTRAST TECHNIQUE: Multiplanar, multiecho pulse sequences of the brain and surrounding structures were obtained without intravenous contrast. COMPARISON:  01/28/2021 FINDINGS: Brain: No acute infarct, mass effect or extra-axial collection. No acute or chronic hemorrhage. Hyperintense T2-weighted signal is moderately widespread throughout the white matter. Generalized volume loss without a clear lobar predilection. Multiple old small vessel infarcts. The midline structures are normal. Vascular: Major flow voids are preserved. Skull and upper cervical spine: Normal calvarium and skull base. Visualized upper cervical spine and soft tissues are normal. Sinuses/Orbits:No paranasal sinus fluid levels or advanced mucosal thickening. No mastoid or middle ear effusion. Normal orbits. IMPRESSION: 1. No acute intracranial abnormality. 2. Findings of chronic ischemic microangiopathy and generalized volume loss. Cerebral Atrophy (ICD10-G31.9). Electronically Signed   By: Ulyses Jarred M.D.   On: 06/12/2021 20:57   CT HEAD CODE STROKE WO CONTRAST  Result Date: 06/12/2021 CLINICAL DATA:  Code stroke.  Neuro deficit, acute, stroke suspected EXAM: CT HEAD WITHOUT CONTRAST TECHNIQUE: Contiguous axial images were obtained from the base of the skull through the vertex without intravenous contrast. COMPARISON:  January 28, 2021. FINDINGS: Brain: Remote infarcts in the right frontal white matter, right basal ganglia, bilateral thalami and bilateral cerebellar hemispheres. Remote left occipital infarct. Additional moderate to advanced patchy and confluent white matter hypodensities, nonspecific but compatible with chronic microvascular ischemic disease. No evidence of acute large vascular territory infarct or acute hemorrhage. Similar  atrophy with ex vacuo ventricular dilation. No hydrocephalus. No mass lesion or midline shift. Vascular: No hyperdense vessel identified. Calcific intracranial atherosclerosis. Skull: No acute fracture. Sinuses/Orbits: Minimal paranasal sinus mucosal thickening. No acute findings in the visualized orbits. Other: No mastoid effusions. ASPECTS [facility] Stroke Program Early CT Score) total score (0-10 with 10 being normal): 10. IMPRESSION: 1. No evidence of acute large vascular territory infarct or acute hemorrhage. ASPECTS is 10. 2. Multiple remote lacunar infarcts and moderate to advanced chronic microvascular ischemic disease. Code stroke imaging results were communicated on 06/12/2021 at 5:34 pm to provider Dr. Charna Archer via telephone, who verbally acknowledged these results. Electronically Signed   By: Margaretha Sheffield M.D.   On: 06/12/2021 17:36    EKG: Independently reviewed.  SR 94, normal axis and interval.   Echocardiogram 01/30/2021: 1. Left ventricular ejection fraction, by estimation, is 60 to 65%. The left ventricle has normal function. The left ventricle has no regional wall motion abnormalities. Left ventricular diastolic parameters are consistent with Grade I diastolic dysfunction (impaired relaxation). 2. Right ventricular systolic function is low normal. The right ventricular size is normal. There is normal pulmonary artery systolic pressure. 3. The mitral valve is abnormal. Trivial mitral valve regurgitation. No evidence of mitral stenosis. 4. The aortic valve is tricuspid. There is moderate calcification of the aortic valve. There is moderate thickening of the aortic valve. Aortic valve regurgitation is mild. Mild to moderate aortic valve sclerosis/calcification is present, without any evidence of aortic stenosis. 5. The inferior vena cava is normal in size with <50% respiratory variability, suggesting right atrial pressure of 8 mmHg.   Assessment/Plan Principal Problem:    Aphasia Active Problems:   HTN (hypertension)   HLD (hyperlipidemia)   History of CVA (cerebrovascular accident)   Benign essential HTN   Seizures (HCC)   Aphasia / H/O cva : Per daughter at bedside there is no clear etiology for her presentation she has presented this way multiple time in past total of 4 with speech issues and is resolved. Imaging has not showed stroke for sure.  Aspirin 81 mg daily.  Crestor 40 mg.  Evaluate for c spine DJD or disc issue ir spinal stenosis.  Rpr/ esr/ b12/ folate/ thyroid - ft4/ tsh.  Aspiration/ fall precaution.  PT/OT consult    HTN; Blood pressure (!) 165/86, pulse 78, temperature 97.8 F (36.6 C), temperature source Oral, resp. rate (!) 29, height _0  (1.473 m), weight 43.1 kg, SpO2 93 %. Hold home regimen of metoprolol/amlodipine./ lisinopril.  to allow for permissive htn.   HLD: Statin and lipid panel.   Seizures: No AED. Seizure precautions.    DVT prophylaxis:  Heparin q 12h.   Code Status:  DNR.  Family Communication:  Peffley,[name] (Daughter)  [phone] (Mobile)   Disposition Plan:  TBD   Consults called:  Neurology.   Admission status: Inpatient.     Para Skeans MD [facility] [phone] How to contact the [facility] Attending or Consulting provider [place] or covering provider during after hours [place], for this patient.    Check the care team in [facility] and look for a) attending/consulting [facility] provider listed and b) the [facility] team listed Log into [email or web address] and use [place] universal password to access. If you do not have the password, please contact the hospital operator. Locate the [facility] provider you are looking for under [facility] and page to a number that you can be directly reached. If you still have difficulty reaching  the provider, please page the [facility] (Director on Call) for the Hospitalists listed on amion for assistance. [email or web address] Password [email or web address] 06/12/2021, 9:19 PM

## 2021-06-12 NOTE — Progress Notes (Signed)
[place] responded to code stroke. Patient was able to engage [facility] and medical staff by answering questions appropriately. Pt's daughter was present. Provided spiritual care through active listening, normalizing feelings and being a non-anxious presence. Spiritual care visit was appreciated.    Marilynn Rail, M.Div. Healthcare Chaplain

## 2021-06-12 NOTE — Consult Note (Signed)
NEURO HOSPITALIST CONSULT NOTE   Requestig physician: Dr. Tamala Julian  Reason for Consult: Acute onset of dysphasia in the context of multiple recurrent episodes that self-resolve with negative work ups.   History obtained from:  Daughter and Chart     HPI:                                                                                                                                          Kellie Harris is an 87 y.o. female with a PMHx of HTN, HLD, arthritis, pancreatic insufficiency, skin cancer, DDD, hearing loss requiring hearing aids and multiple prior episodes of dysphasia that self-resolve, all with negative work ups in the past. She presents to the ED today with a recurrent spell of acute onset dysphasia without dysarthria. Symptoms began at home and patient pushed the alert button that she wears, bringing EMS to her house. On EMS arrival, she had difficulty with word finding and at times speech that was difficult to comprehend in the context of verbal utterances being repeated multiple times, but no dysarthria. She was able to understand all commands. No focal weakness or facial droop was noted by EMS, and she ambulated normally for them. Symptoms began at 4:15 PM.   On interview by examining teams in the ED, the patient endorses that she just had difficulty speaking, although EMS noted that her signature was with significantly decreased legibility when she had been asked to sign a form. Daughter endorses illegible signature has occurred with other episodes in the past. Daughter endorses that these symptoms have occurred multiple times in the past, with negative work ups. During one assessment it was thought by the Neurologist that the spells might be epileptic, so a Keppra trial was started; despite the seizure medication a spell recurred and the patient was subsequently taken off of the anticonvulsant.   On ROS, the patient denies any LOC or incontinence with her spells. No  headache, vision changes, vertigo, chest pain, cough, shortness of breath, abdominal pain, vomiting, diarrhea, burning with urination or recent falls or injuries. No jerking or twitching with the current episode, nor with prior episodes. The patient has no history of memory impairment or dementia symptoms per her daughter.      Past Medical History:  Diagnosis Date   Arthritis    right knee, left shoulder/ OSTEO   Cancer ([place])    skin    Degenerative disc disease, lumbar    L4-L5   Diverticulosis    ITIS   History of hiatal hernia    Hydronephrosis    right   Hypercholesteremia    Hypertension    CONTROLLED ON MEDS   IBS (irritable bowel syndrome)    Knee fracture, right 03/05/2015   DIFFICULTY WITH AMBULATION  Osteopenia    Ovarian cyst    Pancreatic insufficiency    Wears hearing aid    bilateral    Past Surgical History:  Procedure Laterality Date   CATARACT EXTRACTION W/PHACO Right 11/02/2015   Procedure: CATARACT EXTRACTION PHACO AND INTRAOCULAR LENS PLACEMENT (IOC);  Surgeon: Leandrew Koyanagi, MD;  Location: [place];  Service: Ophthalmology;  Laterality: Right;   CATARACT EXTRACTION W/PHACO Left 07/18/2016   Procedure: CATARACT EXTRACTION PHACO AND INTRAOCULAR LENS PLACEMENT (IOC);  Surgeon: Leandrew Koyanagi, MD;  Location: [place];  Service: Ophthalmology;  Laterality: Left;   COLONOSCOPY     FRACTURE SURGERY Left    hip   history of left hip fracture     INTRAMEDULLARY (IM) NAIL INTERTROCHANTERIC Left 02/12/2017   Procedure: INTRAMEDULLARY (IM) NAIL INTERTROCHANTRIC;  Surgeon: Earnestine Leys, MD;  Location: [facility];  Service: Orthopedics;  Laterality: Left;   kidney stent     OOPHORECTOMY Bilateral 2015   ORIF WRIST FRACTURE Right 04/08/2018   Procedure: OPEN REDUCTION INTERNAL FIXATION (ORIF) WRIST FRACTURE;  Surgeon: Altamese [place], MD;  Location: [place];  Service: Orthopedics;  Laterality: Right;   TONSILLECTOMY     TOTAL KNEE  ARTHROPLASTY Right 08/07/2017   Procedure: TOTAL KNEE ARTHROPLASTY;  Surgeon: Earnestine Leys, MD;  Location: [facility];  Service: Orthopedics;  Laterality: Right;    Family History  Problem Relation Age of Onset   Breast cancer Mother 37   Leukemia Mother    Stroke Father              Social History:  reports that she has never smoked. She has never used smokeless tobacco. She reports that she does not drink alcohol and does not use drugs.  No Known Allergies  MEDICATIONS:                                                                                                                     No current facility-administered medications on file prior to encounter.   Current Outpatient Medications on File Prior to Encounter  Medication Sig Dispense Refill   acetaminophen (TYLENOL) 500 MG tablet Take 2 tablets by mouth every 8 (eight) hours as needed.     amLODipine (NORVASC) 5 MG tablet Take 5 mg by mouth daily.     lisinopril (ZESTRIL) 20 MG tablet Take 1 tablet by mouth 2 (two) times daily.     metoprolol succinate (TOPROL-XL) 25 MG 24 hr tablet Take 0.5 mg by mouth daily.     pravastatin (PRAVACHOL) 40 MG tablet Take 1 tablet (40 mg total) by mouth daily at 6 PM. 30 tablet 0   pravastatin (PRAVACHOL) 40 MG tablet Take 1 tablet by mouth at bedtime.     amLODipine (NORVASC) 10 MG tablet Take 1 tablet (10 mg total) by mouth daily. 30 tablet 0   celecoxib (CELEBREX) 200 MG capsule Take 200 mg by mouth daily. (Patient not taking: No sig reported)     lisinopril (ZESTRIL) 20 MG tablet Take 1  tablet (20 mg total) by mouth 2 (two) times daily. 60 tablet 0   metoprolol succinate (TOPROL-XL) 25 MG 24 hr tablet Take 0.5 tablets (12.5 mg total) by mouth daily. 15 tablet 0   senna-docusate (SENOKOT-S) 8.6-50 MG tablet Take 1 tablet by mouth at bedtime as needed for mild constipation. 30 tablet 0      ROS:                                                                                                                                        As per HPI.    Blood pressure (!) 187/105, pulse 87, temperature 97.8 F (36.6 C), temperature source Oral, resp. rate 20, height 4\' 10"  (1.473 m), weight 43.1 kg, SpO2 96 %.   General Examination:                                                                                                       Physical Exam  HEENT-  [place]/AT   Lungs- Respirations unlabored Extremities- No edema  Neurological Examination Mental Status: Awake and alert. Speech is with frequent word-finding deficits manifesting with pauses during which patient appears confused and verbalizes phonemes but not complete words. She has difficulty with naming. Comprehension for all commands is intact. No dysarthria. The patient is fully oriented and her responses to questions which can be answered are with good memory and insight. Other than visual field finding noted below, there is no evidence for neglect.  Cranial Nerves: II: Tested individually, visual fields are intact, but patient has a crescentic visual field deficit on the right when testing for DSS. PERRL.  III,IV, VI: No ptosis. EOMI.  V: Temp sensation equal bilaterally\ VII: No facial droop. Smile symmetric.   VIII: Hearing intact to voice IX,X: No hypophonia XI: Symmetric XII: midline tongue extension Motor: Right : Upper extremity   5/5    Left:     Upper extremity   5/5  Lower extremity   5/5     Lower extremity   5/5 No pronator drift.  Sensory: Temp and FT intact bilaterally. No extinction to DSS.  Deep Tendon Reflexes: 2+ and symmetric throughout Plantars: Right: downgoing   Left: downgoing Cerebellar: No ataxia with FNF bilaterally  Gait: Deferred   Lab Results: Basic Metabolic Panel: Recent Labs  Lab 06/12/21 1737  CREATININE 0.90    CBC: Recent Labs  Lab 06/12/21 1735  WBC 11.6*  NEUTROABS 9.7*  HGB 13.8  HCT 41.4  MCV 90.0  PLT 257    Cardiac Enzymes: No results for input(s): CKTOTAL, CKMB,  CKMBINDEX, TROPONINI in the last 168 hours.  Lipid Panel: No results for input(s): CHOL, TRIG, HDL, CHOLHDL, VLDL, LDLCALC in the last 168 hours.  Imaging: CT HEAD CODE STROKE WO CONTRAST  Result Date: 06/12/2021 CLINICAL DATA:  Code stroke.  Neuro deficit, acute, stroke suspected EXAM: CT HEAD WITHOUT CONTRAST TECHNIQUE: Contiguous axial images were obtained from the base of the skull through the vertex without intravenous contrast. COMPARISON:  January 28, 2021. FINDINGS: Brain: Remote infarcts in the right frontal white matter, right basal ganglia, bilateral thalami and bilateral cerebellar hemispheres. Remote left occipital infarct. Additional moderate to advanced patchy and confluent white matter hypodensities, nonspecific but compatible with chronic microvascular ischemic disease. No evidence of acute large vascular territory infarct or acute hemorrhage. Similar atrophy with ex vacuo ventricular dilation. No hydrocephalus. No mass lesion or midline shift. Vascular: No hyperdense vessel identified. Calcific intracranial atherosclerosis. Skull: No acute fracture. Sinuses/Orbits: Minimal paranasal sinus mucosal thickening. No acute findings in the visualized orbits. Other: No mastoid effusions. ASPECTS [facility] Stroke Program Early CT Score) total score (0-10 with 10 being normal): 10. IMPRESSION: 1. No evidence of acute large vascular territory infarct or acute hemorrhage. ASPECTS is 10. 2. Multiple remote lacunar infarcts and moderate to advanced chronic microvascular ischemic disease. Code stroke imaging results were communicated on 06/12/2021 at 5:34 pm to provider Dr. Charna Archer via telephone, who verbally acknowledged these results. Electronically Signed   By: Margaretha Sheffield M.D.   On: 06/12/2021 17:36    Carotid ultrasound, 01/28/21: No significant flow-limiting stenosis within either carotid artery. Estimated 0-49% stenosis bilaterally. Stable exam.  MRA head, 11/05/2017: Symmetric carotid  arteries. Right vertebral artery dominance. Atheromatous irregularity of bilateral V4 segments with mild narrowing. Poor signal of proximal right M2 branches on reformats is considered artifactual based on source images. No major branch occlusion or proximal flow limiting stenosis.    Assessment: Acute onset of dysphasia in the context of multiple recurrent episodes that self-resolved with negative work ups.  1. Exam reveals expressive speech deficit and extinction on the right with DSS. Signature sample with right hand was with significantly decreased legibility. No focal weakness or facial droop noted.  2. CT head without acute abnormality. Multiple remote lacunar infarcts and moderate to advanced chronic microvascular ischemic disease is noted. 3. Of note, daughter endorses that these symptoms have occurred multiple times in the past, with negative work ups. During a prior assessment it was thought by the Neurologist that the spells might be epileptic, so a Keppra trial was started; despite the seizure medication a spell recurred and the patient was subsequently taken off of the anticonvulsant. No jerking or twitching seen on current exam; no evidence for tongue-bite, urinary or fecal incontinence.  4. Discussed option of TNK with patient's daughter. Given multiple recurrences with subsequent resolution, these stereotyped attacks are unlikely to be due to ischemia. Risks of TNK are felt to significantly outweigh potential benefits. Daughter feels CTA, which requires contrast dye and radiation exposure, should not be done due to prior negative work ups.  5. An epileptic phenomenon is still possible, although she has failed a trial of anticonvulsant.   Recommendations: 1. MRI brain to r/o stroke. 2. Admit for observation if MRI is negative. 3. Call Neurology back if MRI brain is positive.  4. Outpatient Neurology follow up. May benefit from ambulatory EEG monitoring.   Addendum:  - MRI brain: No  acute intracranial abnormality. Findings of chronic ischemic microangiopathy and generalized volume loss. - MRA head: Short segment loss of flow related enhancement in the right PCA proximal P2 segment, which may indicate high-grade stenosis or occlusion. More distal right PCA is normal. Of note, the patient's presenting symptoms localize to the LEFT cerebral hemisphere, contralateral to the flow-related MRA abnormality.  - MRA neck: Severely motion degraded study with limited visibility of the carotid and vertebral arteries. -  Was not on ASA at home. Agree with starting ASA 81 mg po qd.  - Speech/swallow evaluation.    Electronically signed: Dr. Kerney Elbe 06/12/2021, 6:32 PM

## 2021-06-13 ENCOUNTER — Inpatient Hospital Stay: Payer: PPO

## 2021-06-13 DIAGNOSIS — E785 Hyperlipidemia, unspecified: Secondary | ICD-10-CM | POA: Diagnosis not present

## 2021-06-13 DIAGNOSIS — I10 Essential (primary) hypertension: Secondary | ICD-10-CM | POA: Diagnosis not present

## 2021-06-13 DIAGNOSIS — R4701 Aphasia: Secondary | ICD-10-CM | POA: Diagnosis not present

## 2021-06-13 DIAGNOSIS — M542 Cervicalgia: Secondary | ICD-10-CM | POA: Diagnosis not present

## 2021-06-13 LAB — HEMOGLOBIN A1C
Hgb A1c MFr Bld: 5 % (ref 4.8–5.6)
Mean Plasma Glucose: 96.8 mg/dL

## 2021-06-13 LAB — VITAMIN B12: Vitamin B-12: 232 pg/mL (ref 180–914)

## 2021-06-13 LAB — LIPID PANEL
Cholesterol: 183 mg/dL (ref 0–200)
HDL: 58 mg/dL (ref >40)
LDL Cholesterol: 99 mg/dL (ref 0–99)
Total CHOL/HDL Ratio: 3.2 RATIO
Triglycerides: 132 mg/dL (ref <150)
VLDL: 26 mg/dL (ref 0–40)

## 2021-06-13 LAB — RPR: RPR Ser Ql: NONREACTIVE

## 2021-06-13 LAB — URINE DRUG SCREEN, QUALITATIVE (ARMC ONLY)
Amphetamines, Ur Screen: NOT DETECTED
Barbiturates, Ur Screen: NOT DETECTED
Benzodiazepine, Ur Scrn: NOT DETECTED
Cannabinoid 50 Ng, Ur ~~LOC~~: NOT DETECTED
Cocaine Metabolite,Ur ~~LOC~~: NOT DETECTED
MDMA (Ecstasy)Ur Screen: NOT DETECTED
Methadone Scn, Ur: NOT DETECTED
Opiate, Ur Screen: NOT DETECTED
Phencyclidine (PCP) Ur S: NOT DETECTED
Tricyclic, Ur Screen: NOT DETECTED

## 2021-06-13 LAB — FOLATE: Folate: 24 ng/mL (ref >5.9)

## 2021-06-13 LAB — D-DIMER, QUANTITATIVE: D-Dimer, Quant: 0.67 ug/mL-FEU — ABNORMAL HIGH (ref 0.00–0.50)

## 2021-06-13 LAB — MAGNESIUM: Magnesium: 2.1 mg/dL (ref 1.7–2.4)

## 2021-06-13 LAB — TROPONIN I (HIGH SENSITIVITY): Troponin I (High Sensitivity): 15 ng/L (ref <18)

## 2021-06-13 LAB — TSH: TSH: 3.541 u[IU]/mL (ref 0.350–4.500)

## 2021-06-13 LAB — T4, FREE: Free T4: 1.2 ng/dL — ABNORMAL HIGH (ref 0.61–1.12)

## 2021-06-13 MED ORDER — METOPROLOL SUCCINATE ER 25 MG PO TB24
12.5000 mg | ORAL_TABLET | Freq: Every day | ORAL | Status: DC
  Administered 2021-06-13: 12.5 mg via ORAL
  Filled 2021-06-13: qty 0.5

## 2021-06-13 MED ORDER — ASPIRIN 81 MG PO CHEW: 81.0000 mg | CHEWABLE_TABLET | Freq: Every day | ORAL | 0 refills | Status: DC

## 2021-06-13 MED ORDER — LISINOPRIL 10 MG PO TABS
20.0000 mg | ORAL_TABLET | Freq: Every day | ORAL | Status: DC
  Administered 2021-06-13: 20 mg via ORAL
  Filled 2021-06-13: qty 2

## 2021-06-13 MED ORDER — AMLODIPINE BESYLATE 5 MG PO TABS
5.0000 mg | ORAL_TABLET | Freq: Every day | ORAL | Status: DC
  Administered 2021-06-13: 5 mg via ORAL
  Filled 2021-06-13: qty 1

## 2021-06-13 NOTE — ED Notes (Signed)
SLP in room at this time.

## 2021-06-13 NOTE — Evaluation (Signed)
Occupational Therapy Evaluation Patient Details Name: Kellie Harris MRN: [id number] DOB: May 19, 1931 Today's Date: 06/13/2021   History of Present Illness 87 y.o. female seen in ed with complaints of difficulty speaking.   Daughter at bedside, pt recognized her speech difficulty and she called her EMS alert.   Pt states she was resting and then she was trouble speaking.  Pt lives alone with caregiver. Pt was last here in June for CVA.   Clinical Impression   Patient presenting with decreased Ind in self care, balance, functional mobility/transfers, endurance, and safety awareness. Patient reports living at home alone at baseline with use of rollator for functional mobility. Pt's daughter lives ~ 20 minutes away and takes pt to appointments and gets her groceries for her. Pt reports she has been receiving HHPT services at home and has an aide 1x/wk. She has not fallen in 2 years per her report. Pt performs bed mobility without assist. She dons B shoes from edge of stretcher without assistance. Pt stands and ambulates short distance to [facility] to demonstrate transfer and then stands at sink for grooming tasks at supervision level. Pt returns back to bed in same manner as above.RN provides medications this session as well. BP remains elevated throughout session. 178/104 in supine to 187/113 after standing for functional tasks. OT did speak to pt about having some assistance when she returns home for safety initially. Patient will benefit from acute OT to increase overall independence in the areas of ADLs, functional mobility, and safety awareness in order to safely discharge home with family.      Recommendations for follow up therapy are one component of a multi-disciplinary discharge planning process, led by the attending physician.  Recommendations may be updated based on patient status, additional functional criteria and insurance authorization.   Follow Up Recommendations  No OT follow up    Assistance  Recommended at Discharge Intermittent Supervision/Assistance  Functional Status Assessment  Patient has had a recent decline in their functional status and demonstrates the ability to make significant improvements in function in a reasonable and predictable amount of time.  Equipment Recommendations  None recommended by OT       Precautions / Restrictions Precautions Precautions: Fall      Mobility Bed Mobility Overal bed mobility: Independent                  Transfers Overall transfer level: Needs assistance Equipment used: None Transfers: Sit to/from [facility] Sit to Stand: Supervision Stand pivot transfers: Supervision         General transfer comment: close supervision for safety but no physical assistance needed      Balance Overall balance assessment: Needs assistance Sitting-balance support: Feet supported;Bilateral upper extremity supported Sitting balance-Leahy Scale: Good     Standing balance support: During functional activity Standing balance-Leahy Scale: Fair                             ADL either performed or assessed with clinical judgement   ADL                                         General ADL Comments: Pt requires close supervision without use of AD this session for LB clothing management, functional mobility, and toilet transfers.     Vision Patient Visual Report: No change from  baseline              Pertinent Vitals/Pain Pain Assessment: No/denies pain     Hand Dominance Right   Extremity/Trunk Assessment Upper Extremity Assessment Upper Extremity Assessment: Overall WFL for tasks assessed   Lower Extremity Assessment Lower Extremity Assessment: Overall WFL for tasks assessed       Communication Communication Communication: HOH   Cognition Arousal/Alertness: Awake/alert Behavior During Therapy: WFL for tasks assessed/performed Overall Cognitive Status: Within  Functional Limits for tasks assessed                                 General Comments: Pt is very pleasant and cooperative. A &O x4.                Home Living Family/patient expects to be discharged to:: Private residence Living Arrangements: Alone Available Help at Discharge: Family;Available PRN/intermittently Type of Home: House Home Access: Stairs to enter Entrance Stairs-Number of Steps: 1 Entrance Stairs-Rails: Left Home Layout: One level     Bathroom Shower/Tub: Teacher, early years/pre: Handicapped height     Home Equipment: Conservation officer, nature (2 wheels);Rollator (4 wheels);Shower seat;Cane - quad;Cane - single point;Hand held shower head          Prior Functioning/Environment Prior Level of Function : Independent/Modified Independent             Mobility Comments: use of rollator for functional mobility. She was getting HHPT prior to admission. ADLs Comments: Pt reports her daugther assists her with IADL tasks and taking her to appointments. Pt reports mod I for self care and functional transfers. She has an aide that comes 1x/wk but reports "they don't do anything".        OT Problem List: Decreased strength;Decreased activity tolerance;Impaired balance (sitting and/or standing);Decreased safety awareness      OT Treatment/Interventions: Self-care/ADL training;Therapeutic exercise;Patient/family education;Neuromuscular education;Energy conservation;Therapeutic activities;DME and/or AE instruction;Balance training    OT Goals(Current goals can be found in the care plan section) Acute Rehab OT Goals Patient Stated Goal: to go home OT Goal Formulation: With patient Time For Goal Achievement: 06/27/21 Potential to Achieve Goals: Good ADL Goals Pt Will Perform Lower Body Dressing: with modified independence;sit to/from stand Pt Will Transfer to Toilet: with modified independence;ambulating Pt Will Perform Toileting - Clothing  Manipulation and hygiene: with modified independence;sit to/from stand Pt Will Perform Tub/Shower Transfer: with supervision;ambulating;shower seat  OT Frequency: Min 2X/week   Barriers to D/C: Decreased caregiver support  OT recommended she have intermittent assist from family/friends at home          AM-PAC OT "6 Clicks" Daily Activity     Outcome Measure Help from another person eating meals?: None Help from another person taking care of personal grooming?: None Help from another person toileting, which includes using toliet, bedpan, or urinal?: None Help from another person bathing (including washing, rinsing, drying)?: A Little Help from another person to put on and taking off regular upper body clothing?: None Help from another person to put on and taking off regular lower body clothing?: None 6 Click Score: 23   End of Session Nurse Communication: Mobility status;Other (comment) (medication given during this session for hypertension)  Activity Tolerance: Patient tolerated treatment well Patient left: in bed;with call bell/phone within reach  OT Visit Diagnosis: Unsteadiness on feet (R26.81);Muscle weakness (generalized) (M62.81)  Time: 4315-4008 OT Time Calculation (min): 23 min Charges:  OT General Charges $OT Visit: 1 Visit OT Evaluation $OT Eval Low Complexity: 1 Low OT Treatments $Self Care/Home Management : 8-22 mins  Darleen Crocker, MS, OTR/L , CBIS ascom [phone]  06/13/21, 9:50 AM

## 2021-06-13 NOTE — Discharge Summary (Signed)
[facility] [place] NAME: Kellie Harris    MR#:  [id number]  DATE OF BIRTH:  Jul 20, 1931  DATE OF ADMISSION:  06/12/2021 ADMITTING PHYSICIAN: Loletha Grayer, MD  DATE OF DISCHARGE: 06/13/2021  3:08 PM  PRIMARY CARE PHYSICIAN: Dion Body, MD    ADMISSION DIAGNOSIS:  Aphasia [R47.01]  DISCHARGE DIAGNOSIS:  Principal Problem:   Aphasia Active Problems:   HTN (hypertension)   HLD (hyperlipidemia)   History of CVA (cerebrovascular accident)   Benign essential HTN   Seizures ([place])   SECONDARY DIAGNOSIS:   Past Medical History:  Diagnosis Date   Arthritis    right knee, left shoulder/ OSTEO   Cancer ([place])    skin    Degenerative disc disease, lumbar    L4-L5   Diverticulosis    ITIS   History of hiatal hernia    Hydronephrosis    right   Hypercholesteremia    Hypertension    CONTROLLED ON MEDS   IBS (irritable bowel syndrome)    Knee fracture, right 03/05/2015   DIFFICULTY WITH AMBULATION   Osteopenia    Ovarian cyst    Pancreatic insufficiency    Wears hearing aid    bilateral    HOSPITAL COURSE:   Aphasia on presentation.  This is completely resolved.  Patient seen by neurology and cleared to go home.  The patient has had numerous spells that have resolved.  MRI of the brain negative for acute intracranial abnormality.  MRA of the head showed short segment loss of flow related enhancement in the right PCA proximal P2 segment.  Neurology felt that her symptoms would reflect left cerebral hemisphere and not be the cause of her symptoms.  Continue aspirin.  Neurology recommends ambulatory EEG monitoring as outpatient.  Home health PT ordered.  LDL 99. Essential hypertension.  Initially blood pressure medications were held thinking that this could be a stroke.  I restarted her Norvasc, Toprol and lisinopril.  Blood pressure upon discharge 147/81 Hyperlipidemia unspecified.  LDL 99.  Patient on pravastatin  DISCHARGE  CONDITIONS:   Satisfactory  CONSULTS OBTAINED:  Neurology  DRUG ALLERGIES:  No Known Allergies  DISCHARGE MEDICATIONS:   Allergies as of 06/13/2021   No Known Allergies      Medication List     STOP taking these medications    celecoxib 200 MG capsule Commonly known as: CELEBREX       TAKE these medications    acetaminophen 500 MG tablet Commonly known as: TYLENOL Take 2 tablets by mouth every 8 (eight) hours as needed.   amLODipine 5 MG tablet Commonly known as: NORVASC Take 5 mg by mouth daily. What changed: Another medication with the same name was removed. Continue taking this medication, and follow the directions you see here.   aspirin 81 MG chewable tablet Chew 1 tablet (81 mg total) by mouth daily. Start taking on: June 14, 2021   lisinopril 20 MG tablet Commonly known as: ZESTRIL Take 1 tablet by mouth 2 (two) times daily. What changed: Another medication with the same name was removed. Continue taking this medication, and follow the directions you see here.   metoprolol succinate 25 MG 24 hr tablet Commonly known as: TOPROL-XL Take 0.5 mg by mouth daily. What changed: Another medication with the same name was removed. Continue taking this medication, and follow the directions you see here.   pravastatin 40 MG tablet Commonly known as: PRAVACHOL Take 1 tablet by mouth at bedtime.  What changed: Another medication with the same name was removed. Continue taking this medication, and follow the directions you see here.   senna-docusate 8.6-50 MG tablet Commonly known as: Senokot-S Take 1 tablet by mouth at bedtime as needed for mild constipation.         DISCHARGE INSTRUCTIONS:   Follow-up PMD 5 days Follow-up Dr. Melrose Nakayama neurology  If you experience worsening of your admission symptoms, develop shortness of breath, life threatening emergency, suicidal or homicidal thoughts you must seek medical attention immediately by calling 911 or  calling your MD immediately  if symptoms less severe.  You Must read complete instructions/literature along with all the possible adverse reactions/side effects for all the Medicines you take and that have been prescribed to you. Take any new Medicines after you have completely understood and accept all the possible adverse reactions/side effects.   Please note  You were cared for by a hospitalist during your hospital stay. If you have any questions about your discharge medications or the care you received while you were in the hospital after you are discharged, you can call the unit and asked to speak with the hospitalist on call if the hospitalist that took care of you is not available. Once you are discharged, your primary care physician will handle any further medical issues. Please note that NO REFILLS for any discharge medications will be authorized once you are discharged, as it is imperative that you return to your primary care physician (or establish a relationship with a primary care physician if you do not have one) for your aftercare needs so that they can reassess your need for medications and monitor your lab values.    Today   CHIEF COMPLAINT:   Chief Complaint  Patient presents with   Code Stroke    HISTORY OF PRESENT ILLNESS:  Kellie Harris  is a 87 y.o. female brought in with aphasia and code stroke was called.   VITAL SIGNS:  Blood pressure (!) 147/81, pulse 79, temperature 97.8 F (36.6 C), temperature source Oral, resp. rate 17, height 4\' 10"  (1.473 m), weight 43.1 kg, SpO2 97 %.   PHYSICAL EXAMINATION:  GENERAL:  87 y.o.-year-old patient lying in the bed with no acute distress.  EYES: Pupils equal, round, reactive to light and accommodation. No scleral icterus. Extraocular muscles intact. HEENT: Head atraumatic, normocephalic. Oropharynx and nasopharynx clear.  LUNGS: Normal breath sounds bilaterally, no wheezing, rales,rhonchi or crepitation. No use of accessory  muscles of respiration.  CARDIOVASCULAR: S1, S2 normal. No murmurs, rubs, or gallops.  ABDOMEN: Soft, non-tender, non-distended. EXTREMITIES: No pedal edema, cyanosis, or clubbing.  NEUROLOGIC: Cranial nerves II through XII are intact. Muscle strength 5/5 in all extremities. Sensation intact. Gait not checked.  PSYCHIATRIC: The patient is alert and oriented x 3.  SKIN: No obvious rash, lesion, or ulcer.   DATA REVIEW:   CBC Recent Labs  Lab 06/12/21 1735  WBC 11.6*  HGB 13.8  HCT 41.4  PLT 257    Chemistries  Recent Labs  Lab 06/12/21 1735 06/12/21 1737 06/13/21 0711  NA 135  --   --   K 4.1  --   --   CL 101  --   --   CO2 26  --   --   GLUCOSE 119*  --   --   BUN 17  --   --   CREATININE 0.73 0.90  --   CALCIUM 9.4  --   --   MG  --   --  2.1  AST 20  --   --   ALT 12  --   --   ALKPHOS 70  --   --   BILITOT 0.9  --   --      Microbiology Results  Results for orders placed or performed during the hospital encounter of 06/12/21  Resp Panel by RT-PCR (Flu A&B, Covid) Nasopharyngeal Swab     Status: None   Collection Time: 06/12/21  6:25 PM   Specimen: Nasopharyngeal Swab; Nasopharyngeal(NP) swabs in vial transport medium  Result Value Ref Range Status   SARS Coronavirus 2 by RT PCR NEGATIVE NEGATIVE Final    Comment: (NOTE) SARS-CoV-2 target nucleic acids are NOT DETECTED.  The SARS-CoV-2 RNA is generally detectable in upper respiratory specimens during the acute phase of infection. The lowest concentration of SARS-CoV-2 viral copies this assay can detect is 138 copies/mL. A negative result does not preclude SARS-Cov-2 infection and should not be used as the sole basis for treatment or other patient management decisions. A negative result may occur with  improper specimen collection/handling, submission of specimen other than nasopharyngeal swab, presence of viral mutation(s) within the areas targeted by this assay, and inadequate number of  viral copies(<138 copies/mL). A negative result must be combined with clinical observations, patient history, and epidemiological information. The expected result is Negative.  Fact Sheet for Patients:  EntrepreneurPulse.com.au  Fact Sheet for Healthcare Providers:  IncredibleEmployment.be  This test is no t yet approved or cleared by the [place] FDA and  has been authorized for detection and/or diagnosis of SARS-CoV-2 by FDA under an Emergency Use Authorization (EUA). This EUA will remain  in effect (meaning this test can be used) for the duration of the COVID-19 declaration under Section 564(b)(1) of the Act, 21 U.S.C.section 360bbb-3(b)(1), unless the authorization is terminated  or revoked sooner.       Influenza A by PCR NEGATIVE NEGATIVE Final   Influenza B by PCR NEGATIVE NEGATIVE Final    Comment: (NOTE) The Xpert Xpress SARS-CoV-2/FLU/RSV plus assay is intended as an aid in the diagnosis of influenza from Nasopharyngeal swab specimens and should not be used as a sole basis for treatment. Nasal washings and aspirates are unacceptable for Xpert Xpress SARS-CoV-2/FLU/RSV testing.  Fact Sheet for Patients: EntrepreneurPulse.com.au  Fact Sheet for Healthcare Providers: IncredibleEmployment.be  This test is not yet approved or cleared by the [place] FDA and has been authorized for detection and/or diagnosis of SARS-CoV-2 by FDA under an Emergency Use Authorization (EUA). This EUA will remain in effect (meaning this test can be used) for the duration of the COVID-19 declaration under Section 564(b)(1) of the Act, 21 U.S.C. section 360bbb-3(b)(1), unless the authorization is terminated or revoked.  Performed at [facility], [address] [place], [place] [address]     RADIOLOGY:  DG Chest 2 View  Result Date: 06/12/2021 CLINICAL DATA:  Tachypnea EXAM: CHEST - 2 VIEW  COMPARISON:  02/01/2021 moderate sigmoid thoracolumbar FINDINGS: Lungs are well expanded, symmetric, and clear. No pneumothorax or pleural effusion. Cardiac size within normal limits. Pulmonary vascularity is normal. Osseous structures are age-appropriate. Scoliosis again noted. No acute bone abnormality. IMPRESSION: No active cardiopulmonary disease. Electronically Signed   By: Fidela Salisbury M.D.   On: 06/12/2021 19:48   MR ANGIO HEAD WO CONTRAST  Result Date: 06/12/2021 CLINICAL DATA:  Acute neurologic deficit EXAM: MRA HEAD WITHOUT CONTRAST TECHNIQUE: Angiographic images of the Circle of Willis were acquired using MRA technique without intravenous contrast.  COMPARISON:  No pertinent prior exam. FINDINGS: POSTERIOR CIRCULATION: --Vertebral arteries: Normal --Inferior cerebellar arteries: Normal. --Basilar artery: Normal. --Superior cerebellar arteries: Normal. --Posterior cerebral arteries: There is a short segment of loss of flow related enhancement within the right PCA proximal P2 segment. More distally, the right PCA is normal. Left PCA is normal. ANTERIOR CIRCULATION: --Intracranial internal carotid arteries: Normal. --Anterior cerebral arteries (ACA): Normal. --Middle cerebral arteries (MCA): Normal. ANATOMIC VARIANTS: None IMPRESSION: 1. Short segment loss of flow related enhancement in the right PCA proximal P2 segment, which may indicate high-grade stenosis or occlusion. More distal right PCA is normal. 2. Otherwise normal intracranial MRA. Electronically Signed   By: Ulyses Jarred M.D.   On: 06/12/2021 21:07   MR ANGIO NECK WO CONTRAST  Result Date: 06/12/2021 CLINICAL DATA:  Acute neurologic deficit EXAM: MRA NECK WITHOUT CONTRAST TECHNIQUE: Angiographic images of the neck were acquired using MRA technique without intravenous contrast. Carotid stenosis measurements (when applicable) are obtained utilizing NASCET criteria, using the distal internal carotid diameter as the denominator.  COMPARISON:  No pertinent prior exam. FINDINGS: Severely motion degraded study with limited visibility of the carotid and vertebral arteries. The visible portion of the internal carotid arteries are patent. IMPRESSION: Severely motion degraded study with limited visibility of the carotid and vertebral arteries. Electronically Signed   By: Ulyses Jarred M.D.   On: 06/12/2021 21:09   MR BRAIN WO CONTRAST  Result Date: 06/12/2021 CLINICAL DATA:  Seizure EXAM: MRI HEAD WITHOUT CONTRAST TECHNIQUE: Multiplanar, multiecho pulse sequences of the brain and surrounding structures were obtained without intravenous contrast. COMPARISON:  01/28/2021 FINDINGS: Brain: No acute infarct, mass effect or extra-axial collection. No acute or chronic hemorrhage. Hyperintense T2-weighted signal is moderately widespread throughout the white matter. Generalized volume loss without a clear lobar predilection. Multiple old small vessel infarcts. The midline structures are normal. Vascular: Major flow voids are preserved. Skull and upper cervical spine: Normal calvarium and skull base. Visualized upper cervical spine and soft tissues are normal. Sinuses/Orbits:No paranasal sinus fluid levels or advanced mucosal thickening. No mastoid or middle ear effusion. Normal orbits. IMPRESSION: 1. No acute intracranial abnormality. 2. Findings of chronic ischemic microangiopathy and generalized volume loss. Cerebral Atrophy (ICD10-G31.9). Electronically Signed   By: Ulyses Jarred M.D.   On: 06/12/2021 20:57   MR CERVICAL SPINE WO CONTRAST  Result Date: 06/13/2021 CLINICAL DATA:  Neck pain and bilateral arm weakness EXAM: MRI CERVICAL SPINE WITHOUT CONTRAST TECHNIQUE: Multiplanar, multisequence MR imaging of the cervical spine was performed. No intravenous contrast was administered. COMPARISON:  None. FINDINGS: Alignment: Grade 1 anterolisthesis at C2-3 and grade 1 retrolisthesis at C4-5 Vertebrae: No fracture, evidence of discitis, or bone  lesion. Cord: Normal signal and morphology. Posterior Fossa, vertebral arteries, paraspinal tissues: Negative. Disc levels: C1-2: Unremarkable. C2-3: Normal disc space and facet joints. There is no spinal canal stenosis. No neural foraminal stenosis. C3-4: Disc space narrowing with endplate spurring. There is no spinal canal stenosis. Mild left neural foraminal stenosis. C4-5: Small disc bulge with endplate spurring. There is no spinal canal stenosis. No neural foraminal stenosis. C5-6: Small disc bulge with endplate spurring. There is no spinal canal stenosis. No neural foraminal stenosis. C6-7: Normal disc space and facet joints. There is no spinal canal stenosis. No neural foraminal stenosis. C7-T1: Normal disc space and facet joints. There is no spinal canal stenosis. No neural foraminal stenosis. IMPRESSION: 1. Mild multilevel degenerative disc disease without spinal canal stenosis. 2. Mild left C3-4 neural foraminal stenosis. Electronically Signed  By: Ulyses Jarred M.D.   On: 06/13/2021 01:00   CT HEAD CODE STROKE WO CONTRAST  Result Date: 06/12/2021 CLINICAL DATA:  Code stroke.  Neuro deficit, acute, stroke suspected EXAM: CT HEAD WITHOUT CONTRAST TECHNIQUE: Contiguous axial images were obtained from the base of the skull through the vertex without intravenous contrast. COMPARISON:  January 28, 2021. FINDINGS: Brain: Remote infarcts in the right frontal white matter, right basal ganglia, bilateral thalami and bilateral cerebellar hemispheres. Remote left occipital infarct. Additional moderate to advanced patchy and confluent white matter hypodensities, nonspecific but compatible with chronic microvascular ischemic disease. No evidence of acute large vascular territory infarct or acute hemorrhage. Similar atrophy with ex vacuo ventricular dilation. No hydrocephalus. No mass lesion or midline shift. Vascular: No hyperdense vessel identified. Calcific intracranial atherosclerosis. Skull: No acute fracture.  Sinuses/Orbits: Minimal paranasal sinus mucosal thickening. No acute findings in the visualized orbits. Other: No mastoid effusions. ASPECTS [facility] Stroke Program Early CT Score) total score (0-10 with 10 being normal): 10. IMPRESSION: 1. No evidence of acute large vascular territory infarct or acute hemorrhage. ASPECTS is 10. 2. Multiple remote lacunar infarcts and moderate to advanced chronic microvascular ischemic disease. Code stroke imaging results were communicated on 06/12/2021 at 5:34 pm to provider Dr. Charna Archer via telephone, who verbally acknowledged these results. Electronically Signed   By: Margaretha Sheffield M.D.   On: 06/12/2021 17:36      Management plans discussed with the patient, family and they are in agreement.  CODE STATUS:     Code Status Orders  (From admission, onward)           Start     Ordered   06/12/21 2116  Do not attempt resuscitation (DNR)  Continuous       Question Answer Comment  In the event of cardiac or respiratory ARREST Do not call a "code blue"   In the event of cardiac or respiratory ARREST Do not perform Intubation, CPR, defibrillation or ACLS   In the event of cardiac or respiratory ARREST Use medication by any route, position, wound care, and other measures to relive pain and suffering. May use oxygen, suction and manual treatment of airway obstruction as needed for comfort.      06/12/21 2116           Code Status History     Date Active Date Inactive Code Status Order ID Comments User Context   06/12/2021 2100 06/12/2021 2116 Full Code [id number]  Para Skeans, MD ED   01/28/2021 2012 02/02/2021 1754 DNR [id number]  Sidney Ace, Arvella Merles, MD ED   05/03/2020 2124 05/04/2020 2257 DNR [id number]  Lenore Cordia, MD ED   04/03/2018 1954 04/10/2018 2106 Full Code [id number]  Kerney Elbe, MD ED   11/04/2017 2053 11/05/2017 2023 DNR [id number]  Saundra Shelling, MD ED   08/25/2017 0114 08/27/2017 1950 Full Code [id number]  Lance Coon, MD Inpatient   08/07/2017  1217 08/09/2017 1954 Full Code [id number]  Earnestine Leys, MD Inpatient   02/12/2017 0140 02/14/2017 2023 Full Code [id number]  Lance Coon, MD ED       TOTAL TIME TAKING CARE OF THIS PATIENT: 36 minutes.    Loletha Grayer M.D on 06/13/2021 at 5:31 PM   [facility]  CC: Primary care physician; Dion Body, MD

## 2021-06-13 NOTE — ED Notes (Signed)
Pt ate 25% of breakfast tray.

## 2021-06-13 NOTE — Progress Notes (Signed)
Subjective: No complaints. Speech is back to baseline per patient.   Objective: Current vital signs: BP (!) 182/171   Pulse 64   Temp 97.8 F (36.6 C) (Oral)   Resp 19   Ht 4\' 10"  (1.473 m)   Wt 43.1 kg   SpO2 100%   BMI 19.86 kg/m  Vital signs in last 24 hours: Temp:  [97.8 F (36.6 C)] 97.8 F (36.6 C) (10/31 1804) Pulse Rate:  [56-102] 64 (11/01 0700) Resp:  [15-29] 19 (11/01 0700) BP: (137-187)/(82-171) 182/171 (11/01 0700) SpO2:  [92 %-100 %] 100 % (11/01 0700) Weight:  [43.1 kg] 43.1 kg (10/31 1821)  Intake/Output from previous day: No intake/output data recorded. Intake/Output this shift: No intake/output data recorded. Nutritional status:  Diet Order             Diet NPO time specified  Diet effective now                  HEENT: [place]/AT Lungs: Respirations unlabored Ext: No edema  Neurologic Exam: Ment: Alert and oriented x 5. Speech nondysarthric. Her aphasia is now resolved, with fluent speech, ability to answer complex questions and follow all commands, intact naming and no hesitancy or slowing.  CN: Pupils equal. Temporal visual fields intact with no extinction to DSS. EOMI. Smile symmetric. Phonation intact.  Motor: 5/5 x 4 Sensory: Intact to FT x 4. No extinction to DSS.  Cerebellar: No ataxia with FNF bilaterally.  Gait: Deferred   Lab Results: Results for orders placed or performed during the hospital encounter of 06/12/21 (from the past 48 hour(s))  CBG monitoring, ED     Status: Abnormal   Collection Time: 06/12/21  5:15 PM  Result Value Ref Range   Glucose-Capillary 119 (H) 70 - 99 mg/dL    Comment: Glucose reference range applies only to samples taken after fasting for at least 8 hours.  Protime-INR     Status: None   Collection Time: 06/12/21  5:35 PM  Result Value Ref Range   Prothrombin Time 13.3 11.4 - 15.2 seconds   INR 1.0 0.8 - 1.2    Comment: (NOTE) INR goal varies based on device and disease states. Performed at [facility], [address] [place], [place] [address]   APTT     Status: None   Collection Time: 06/12/21  5:35 PM  Result Value Ref Range   aPTT 32 24 - 36 seconds    Comment: Performed at [facility], [address] [place], [place] [address]  Comprehensive metabolic panel     Status: Abnormal   Collection Time: 06/12/21  5:35 PM  Result Value Ref Range   Sodium 135 135 - 145 mmol/L   Potassium 4.1 3.5 - 5.1 mmol/L   Chloride 101 98 - 111 mmol/L   CO2 26 22 - 32 mmol/L   Glucose, Bld 119 (H) 70 - 99 mg/dL    Comment: Glucose reference range applies only to samples taken after fasting for at least 8 hours.   BUN 17 8 - 23 mg/dL   Creatinine, Ser 0.73 0.44 - 1.00 mg/dL   Calcium 9.4 8.9 - 10.3 mg/dL   Total Protein 7.5 6.5 - 8.1 g/dL   Albumin 4.6 3.5 - 5.0 g/dL   AST 20 15 - 41 U/L   ALT 12 0 - 44 U/L   Alkaline Phosphatase 70 38 - 126 U/L   Total Bilirubin 0.9 0.3 - 1.2 mg/dL   GFR, Estimated >  60 >60 mL/min    Comment: (NOTE) Calculated using the CKD-EPI Creatinine Equation (2021)    Anion gap 8 5 - 15    Comment: Performed at [facility], [address] [place], [place] [address]  CBC WITH DIFFERENTIAL     Status: Abnormal   Collection Time: 06/12/21  5:35 PM  Result Value Ref Range   WBC 11.6 (H) 4.0 - 10.5 K/uL   RBC 4.60 3.87 - 5.11 MIL/uL   Hemoglobin 13.8 12.0 - 15.0 g/dL   HCT 41.4 36.0 - 46.0 %   MCV 90.0 80.0 - 100.0 fL   MCH 30.0 26.0 - 34.0 pg   MCHC 33.3 30.0 - 36.0 g/dL   RDW 14.3 11.5 - 15.5 %   Platelets 257 150 - 400 K/uL   nRBC 0.0 0.0 - 0.2 %   Neutrophils Relative % 84 %   Neutro Abs 9.7 (H) 1.7 - 7.7 K/uL   Lymphocytes Relative 8 %   Lymphs Abs 0.9 0.7 - 4.0 K/uL   Monocytes Relative 5 %   Monocytes Absolute 0.6 0.1 - 1.0 K/uL   Eosinophils Relative 2 %   Eosinophils Absolute 0.2 0.0 - 0.5 K/uL   Basophils Relative 1 %   Basophils Absolute 0.1 0.0 - 0.1 K/uL   Immature Granulocytes 0 %   Abs Immature  Granulocytes 0.05 0.00 - 0.07 K/uL    Comment: Performed at [facility], [address] [place], [place] [address]  I-stat Creatinine, ED     Status: None   Collection Time: 06/12/21  5:37 PM  Result Value Ref Range   Creatinine, Ser 0.90 0.44 - 1.00 mg/dL  Resp Panel by RT-PCR (Flu A&B, Covid) Nasopharyngeal Swab     Status: None   Collection Time: 06/12/21  6:25 PM   Specimen: Nasopharyngeal Swab; Nasopharyngeal(NP) swabs in vial transport medium  Result Value Ref Range   SARS Coronavirus 2 by RT PCR NEGATIVE NEGATIVE    Comment: (NOTE) SARS-CoV-2 target nucleic acids are NOT DETECTED.  The SARS-CoV-2 RNA is generally detectable in upper respiratory specimens during the acute phase of infection. The lowest concentration of SARS-CoV-2 viral copies this assay can detect is 138 copies/mL. A negative result does not preclude SARS-Cov-2 infection and should not be used as the sole basis for treatment or other patient management decisions. A negative result may occur with  improper specimen collection/handling, submission of specimen other than nasopharyngeal swab, presence of viral mutation(s) within the areas targeted by this assay, and inadequate number of viral copies(<138 copies/mL). A negative result must be combined with clinical observations, patient history, and epidemiological information. The expected result is Negative.  Fact Sheet for Patients:  EntrepreneurPulse.com.au  Fact Sheet for Healthcare Providers:  IncredibleEmployment.be  This test is no t yet approved or cleared by the [place] FDA and  has been authorized for detection and/or diagnosis of SARS-CoV-2 by FDA under an Emergency Use Authorization (EUA). This EUA will remain  in effect (meaning this test can be used) for the duration of the COVID-19 declaration under Section 564(b)(1) of the Act, 21 U.S.C.section 360bbb-3(b)(1), unless the authorization is  terminated  or revoked sooner.       Influenza A by PCR NEGATIVE NEGATIVE   Influenza B by PCR NEGATIVE NEGATIVE    Comment: (NOTE) The Xpert Xpress SARS-CoV-2/FLU/RSV plus assay is intended as an aid in the diagnosis of influenza from Nasopharyngeal swab specimens and should not be used as a sole basis for  treatment. Nasal washings and aspirates are unacceptable for Xpert Xpress SARS-CoV-2/FLU/RSV testing.  Fact Sheet for Patients: EntrepreneurPulse.com.au  Fact Sheet for Healthcare Providers: IncredibleEmployment.be  This test is not yet approved or cleared by the [place] FDA and has been authorized for detection and/or diagnosis of SARS-CoV-2 by FDA under an Emergency Use Authorization (EUA). This EUA will remain in effect (meaning this test can be used) for the duration of the COVID-19 declaration under Section 564(b)(1) of the Act, 21 U.S.C. section 360bbb-3(b)(1), unless the authorization is terminated or revoked.  Performed at [facility], [address] [place], [place] [address]   Urinalysis, Complete w Microscopic     Status: Abnormal   Collection Time: 06/12/21  6:25 PM  Result Value Ref Range   Color, Urine STRAW (A) YELLOW   APPearance CLEAR (A) CLEAR   Specific Gravity, Urine 1.004 (L) 1.005 - 1.030   pH 7.0 5.0 - 8.0   Glucose, UA NEGATIVE NEGATIVE mg/dL   Hgb urine dipstick NEGATIVE NEGATIVE   Bilirubin Urine NEGATIVE NEGATIVE   Ketones, ur NEGATIVE NEGATIVE mg/dL   Protein, ur NEGATIVE NEGATIVE mg/dL   Nitrite NEGATIVE NEGATIVE   Leukocytes,Ua TRACE (A) NEGATIVE   WBC, UA 0-5 0 - 5 WBC/hpf   Bacteria, UA RARE (A) NONE SEEN   Squamous Epithelial / LPF 0-5 0 - 5    Comment: Performed at [facility], [address] [place],  [address]  Urine Drug Screen, Qualitative ([facility] only)     Status: None   Collection Time: 06/12/21  6:25 PM  Result Value Ref Range   Tricyclic, Ur Screen  NONE DETECTED NONE DETECTED   Amphetamines, Ur Screen NONE DETECTED NONE DETECTED   MDMA (Ecstasy)Ur Screen NONE DETECTED NONE DETECTED   Cocaine Metabolite,Ur [place] NONE DETECTED NONE DETECTED   Opiate, Ur Screen NONE DETECTED NONE DETECTED   Phencyclidine (PCP) Ur S NONE DETECTED NONE DETECTED   Cannabinoid 50 Ng, Ur [place] NONE DETECTED NONE DETECTED   Barbiturates, Ur Screen NONE DETECTED NONE DETECTED   Benzodiazepine, Ur Scrn NONE DETECTED NONE DETECTED   Methadone Scn, Ur NONE DETECTED NONE DETECTED    Comment: (NOTE) Tricyclics + metabolites, urine    Cutoff 1000 ng/mL Amphetamines + metabolites, urine  Cutoff 1000 ng/mL MDMA (Ecstasy), urine              Cutoff 500 ng/mL Cocaine Metabolite, urine          Cutoff 300 ng/mL Opiate + metabolites, urine        Cutoff 300 ng/mL Phencyclidine (PCP), urine         Cutoff 25 ng/mL Cannabinoid, urine                 Cutoff 50 ng/mL Barbiturates + metabolites, urine  Cutoff 200 ng/mL Benzodiazepine, urine              Cutoff 200 ng/mL Methadone, urine                   Cutoff 300 ng/mL  The urine drug screen provides only a preliminary, unconfirmed analytical test result and should not be used for non-medical purposes. Clinical consideration and professional judgment should be applied to any positive drug screen result due to possible interfering substances. A more specific alternate chemical method must be used in order to obtain a confirmed analytical result. Gas chromatography / mass spectrometry (GC/MS) is the preferred confirm atory method. Performed at [facility], [address] [place], [place] [address]   Troponin I (High Sensitivity)     Status: None   Collection Time: 06/13/21  7:11 AM  Result Value Ref Range   Troponin I (High Sensitivity) 15 <18 ng/L    Comment: (NOTE) Elevated high sensitivity troponin I (hsTnI) values and significant  changes across serial measurements may suggest ACS but many other  chronic  and acute conditions are known to elevate hsTnI results.  Refer to the "Links" section for chest pain algorithms and additional  guidance. Performed at [facility], [address] [place], [place] [address]   D-dimer, quantitative     Status: Abnormal   Collection Time: 06/13/21  7:11 AM  Result Value Ref Range   D-Dimer, Quant 0.67 (H) 0.00 - 0.50 ug/mL-FEU    Comment: (NOTE) At the manufacturer cut-off value of 0.5 g/mL FEU, this assay has a negative predictive value of 95-100%.This assay is intended for use in conjunction with a clinical pretest probability (PTP) assessment model to exclude pulmonary embolism (PE) and deep venous thrombosis (DVT) in outpatients suspected of PE or DVT. Results should be correlated with clinical presentation. Performed at [facility], [address], [place], [place] [address]     Recent Results (from the past 240 hour(s))  Resp Panel by RT-PCR (Flu A&B, Covid) Nasopharyngeal Swab     Status: None   Collection Time: 06/12/21  6:25 PM   Specimen: Nasopharyngeal Swab; Nasopharyngeal(NP) swabs in vial transport medium  Result Value Ref Range Status   SARS Coronavirus 2 by RT PCR NEGATIVE NEGATIVE Final    Comment: (NOTE) SARS-CoV-2 target nucleic acids are NOT DETECTED.  The SARS-CoV-2 RNA is generally detectable in upper respiratory specimens during the acute phase of infection. The lowest concentration of SARS-CoV-2 viral copies this assay can detect is 138 copies/mL. A negative result does not preclude SARS-Cov-2 infection and should not be used as the sole basis for treatment or other patient management decisions. A negative result may occur with  improper specimen collection/handling, submission of specimen other than nasopharyngeal swab, presence of viral mutation(s) within the areas targeted by this assay, and inadequate number of viral copies(<138 copies/mL). A negative result must be combined with clinical  observations, patient history, and epidemiological information. The expected result is Negative.  Fact Sheet for Patients:  EntrepreneurPulse.com.au  Fact Sheet for Healthcare Providers:  IncredibleEmployment.be  This test is no t yet approved or cleared by the [place] FDA and  has been authorized for detection and/or diagnosis of SARS-CoV-2 by FDA under an Emergency Use Authorization (EUA). This EUA will remain  in effect (meaning this test can be used) for the duration of the COVID-19 declaration under Section 564(b)(1) of the Act, 21 U.S.C.section 360bbb-3(b)(1), unless the authorization is terminated  or revoked sooner.       Influenza A by PCR NEGATIVE NEGATIVE Final   Influenza B by PCR NEGATIVE NEGATIVE Final    Comment: (NOTE) The Xpert Xpress SARS-CoV-2/FLU/RSV plus assay is intended as an aid in the diagnosis of influenza from Nasopharyngeal swab specimens and should not be used as a sole basis for treatment. Nasal washings and aspirates are unacceptable for Xpert Xpress SARS-CoV-2/FLU/RSV testing.  Fact Sheet for Patients: EntrepreneurPulse.com.au  Fact Sheet for Healthcare Providers: IncredibleEmployment.be  This test is not yet approved or cleared by the [place] FDA and has been authorized for detection and/or diagnosis of SARS-CoV-2 by FDA under an Emergency Use Authorization (EUA). This EUA will remain in  effect (meaning this test can be used) for the duration of the COVID-19 declaration under Section 564(b)(1) of the Act, 21 U.S.C. section 360bbb-3(b)(1), unless the authorization is terminated or revoked.  Performed at [facility], [address] [place], [place] [address]     Lipid Panel No results for input(s): CHOL, TRIG, HDL, CHOLHDL, VLDL, LDLCALC in the last 72 hours.  Studies/Results: DG Chest 2 View  Result Date: 06/12/2021 CLINICAL DATA:   Tachypnea EXAM: CHEST - 2 VIEW COMPARISON:  02/01/2021 moderate sigmoid thoracolumbar FINDINGS: Lungs are well expanded, symmetric, and clear. No pneumothorax or pleural effusion. Cardiac size within normal limits. Pulmonary vascularity is normal. Osseous structures are age-appropriate. Scoliosis again noted. No acute bone abnormality. IMPRESSION: No active cardiopulmonary disease. Electronically Signed   By: Fidela Salisbury M.D.   On: 06/12/2021 19:48   MR ANGIO HEAD WO CONTRAST  Result Date: 06/12/2021 CLINICAL DATA:  Acute neurologic deficit EXAM: MRA HEAD WITHOUT CONTRAST TECHNIQUE: Angiographic images of the Circle of Willis were acquired using MRA technique without intravenous contrast. COMPARISON:  No pertinent prior exam. FINDINGS: POSTERIOR CIRCULATION: --Vertebral arteries: Normal --Inferior cerebellar arteries: Normal. --Basilar artery: Normal. --Superior cerebellar arteries: Normal. --Posterior cerebral arteries: There is a short segment of loss of flow related enhancement within the right PCA proximal P2 segment. More distally, the right PCA is normal. Left PCA is normal. ANTERIOR CIRCULATION: --Intracranial internal carotid arteries: Normal. --Anterior cerebral arteries (ACA): Normal. --Middle cerebral arteries (MCA): Normal. ANATOMIC VARIANTS: None IMPRESSION: 1. Short segment loss of flow related enhancement in the right PCA proximal P2 segment, which may indicate high-grade stenosis or occlusion. More distal right PCA is normal. 2. Otherwise normal intracranial MRA. Electronically Signed   By: Ulyses Jarred M.D.   On: 06/12/2021 21:07   MR ANGIO NECK WO CONTRAST  Result Date: 06/12/2021 CLINICAL DATA:  Acute neurologic deficit EXAM: MRA NECK WITHOUT CONTRAST TECHNIQUE: Angiographic images of the neck were acquired using MRA technique without intravenous contrast. Carotid stenosis measurements (when applicable) are obtained utilizing NASCET criteria, using the distal internal carotid  diameter as the denominator. COMPARISON:  No pertinent prior exam. FINDINGS: Severely motion degraded study with limited visibility of the carotid and vertebral arteries. The visible portion of the internal carotid arteries are patent. IMPRESSION: Severely motion degraded study with limited visibility of the carotid and vertebral arteries. Electronically Signed   By: Ulyses Jarred M.D.   On: 06/12/2021 21:09   MR BRAIN WO CONTRAST  Result Date: 06/12/2021 CLINICAL DATA:  Seizure EXAM: MRI HEAD WITHOUT CONTRAST TECHNIQUE: Multiplanar, multiecho pulse sequences of the brain and surrounding structures were obtained without intravenous contrast. COMPARISON:  01/28/2021 FINDINGS: Brain: No acute infarct, mass effect or extra-axial collection. No acute or chronic hemorrhage. Hyperintense T2-weighted signal is moderately widespread throughout the white matter. Generalized volume loss without a clear lobar predilection. Multiple old small vessel infarcts. The midline structures are normal. Vascular: Major flow voids are preserved. Skull and upper cervical spine: Normal calvarium and skull base. Visualized upper cervical spine and soft tissues are normal. Sinuses/Orbits:No paranasal sinus fluid levels or advanced mucosal thickening. No mastoid or middle ear effusion. Normal orbits. IMPRESSION: 1. No acute intracranial abnormality. 2. Findings of chronic ischemic microangiopathy and generalized volume loss. Cerebral Atrophy (ICD10-G31.9). Electronically Signed   By: Ulyses Jarred M.D.   On: 06/12/2021 20:57   MR CERVICAL SPINE WO CONTRAST  Result Date: 06/13/2021 CLINICAL DATA:  Neck pain and bilateral arm weakness EXAM: MRI CERVICAL SPINE WITHOUT CONTRAST  TECHNIQUE: Multiplanar, multisequence MR imaging of the cervical spine was performed. No intravenous contrast was administered. COMPARISON:  None. FINDINGS: Alignment: Grade 1 anterolisthesis at C2-3 and grade 1 retrolisthesis at C4-5 Vertebrae: No fracture,  evidence of discitis, or bone lesion. Cord: Normal signal and morphology. Posterior Fossa, vertebral arteries, paraspinal tissues: Negative. Disc levels: C1-2: Unremarkable. C2-3: Normal disc space and facet joints. There is no spinal canal stenosis. No neural foraminal stenosis. C3-4: Disc space narrowing with endplate spurring. There is no spinal canal stenosis. Mild left neural foraminal stenosis. C4-5: Small disc bulge with endplate spurring. There is no spinal canal stenosis. No neural foraminal stenosis. C5-6: Small disc bulge with endplate spurring. There is no spinal canal stenosis. No neural foraminal stenosis. C6-7: Normal disc space and facet joints. There is no spinal canal stenosis. No neural foraminal stenosis. C7-T1: Normal disc space and facet joints. There is no spinal canal stenosis. No neural foraminal stenosis. IMPRESSION: 1. Mild multilevel degenerative disc disease without spinal canal stenosis. 2. Mild left C3-4 neural foraminal stenosis. Electronically Signed   By: Ulyses Jarred M.D.   On: 06/13/2021 01:00   CT HEAD CODE STROKE WO CONTRAST  Result Date: 06/12/2021 CLINICAL DATA:  Code stroke.  Neuro deficit, acute, stroke suspected EXAM: CT HEAD WITHOUT CONTRAST TECHNIQUE: Contiguous axial images were obtained from the base of the skull through the vertex without intravenous contrast. COMPARISON:  January 28, 2021. FINDINGS: Brain: Remote infarcts in the right frontal white matter, right basal ganglia, bilateral thalami and bilateral cerebellar hemispheres. Remote left occipital infarct. Additional moderate to advanced patchy and confluent white matter hypodensities, nonspecific but compatible with chronic microvascular ischemic disease. No evidence of acute large vascular territory infarct or acute hemorrhage. Similar atrophy with ex vacuo ventricular dilation. No hydrocephalus. No mass lesion or midline shift. Vascular: No hyperdense vessel identified. Calcific intracranial  atherosclerosis. Skull: No acute fracture. Sinuses/Orbits: Minimal paranasal sinus mucosal thickening. No acute findings in the visualized orbits. Other: No mastoid effusions. ASPECTS [facility] Stroke Program Early CT Score) total score (0-10 with 10 being normal): 10. IMPRESSION: 1. No evidence of acute large vascular territory infarct or acute hemorrhage. ASPECTS is 10. 2. Multiple remote lacunar infarcts and moderate to advanced chronic microvascular ischemic disease. Code stroke imaging results were communicated on 06/12/2021 at 5:34 pm to provider Dr. Charna Archer via telephone, who verbally acknowledged these results. Electronically Signed   By: Margaretha Sheffield M.D.   On: 06/12/2021 17:36    Medications: Scheduled:   stroke: mapping our early stages of recovery book   Does not apply Once   aspirin  81 mg Oral Daily   heparin  5,000 Units Subcutaneous Q12H   rosuvastatin  40 mg Oral QHS   Continuous:  sodium chloride 30 mL/hr at 06/13/21 0741    Assessment: Acute onset of dysphasia in the context of multiple recurrent episodes that self-resolved with negative work ups.  1. Exam today reveals complete resolution of the speech deficit that was present yesterday. Visual fields are also normal. No focal weakness or facial droop. Symptomatically, the patient is also back to baseline.  2. MRI brain: No acute intracranial abnormality. Findings of chronic ischemic microangiopathy and generalized volume loss. 3. MRA head: Short segment loss of flow related enhancement in the right PCA proximal P2 segment, which may indicate high-grade stenosis or occlusion. More distal right PCA is normal. Of note, the patient's presenting symptoms localize to the LEFT cerebral hemisphere, contralateral to the flow-related MRA abnormality.  4. MRA  neck: Severely motion degraded study with limited visibility of the carotid and vertebral arteries. 5. Of note, daughter endorses that these symptoms have occurred multiple times  in the past, with negative work ups. During a prior assessment it was thought by the Neurologist that the spells might be epileptic, so a Keppra trial was started; despite the seizure medication a spell recurred and the patient was subsequently taken off of the anticonvulsant. No jerking or twitching seen on current exam; no evidence for tongue-bite, urinary or fecal incontinence.  6. An epileptic phenomenon is still possible, although she has failed a trial of anticonvulsant.    Recommendations: 1. Was not on ASA at home. Agree with starting ASA 81 mg po qd.  2. Outpatient Neurology follow up.  3. May benefit from ambulatory EEG monitoring as an outpatient.      LOS: 1 day   @Electronically  signed: Dr. Kerney Elbe 06/13/2021  7:55 AM

## 2021-06-13 NOTE — ED Notes (Signed)
OT in room at this time.

## 2021-06-13 NOTE — Evaluation (Signed)
Physical Therapy Evaluation Patient Details Name: Kellie Harris MRN: [id number] DOB: 12/04/1930 Today's Date: 06/13/2021  History of Present Illness  Pt is an 86 y.o. female presenting to hospital 10/31 with word finding difficulties.  MRI of brain (-) for acute intracranial abnormality.  PMH includes DDD, diverticulosis, htn, HDL, osteopenia, pancreatic insufficiency, h/o previous CVA's (without residual deficits), R knee fx, s/p ORIF radius and ulnar fx 2019, seizures, R TKR, L hip fx, L IMN intertrochanteric 2018, previous episodes of vomiting, aphasia.  Clinical Impression  Prior to hospital admission, pt was modified independent with functional mobility (used rollator within home and RW outside of home); h/o L hip/LE weakness; receiving HHPT 1x/week; and lives alone in 1 level home with 1-2 steps to enter.  During session pt SBA with transfers and CGA with ambulation 140 feet with RW (increasing L LE antalgic type gait noted with increased distance ambulating but pt steady and safe with functional mobility).  Pt would benefit from skilled PT to address noted impairments and functional limitations (see below for any additional details).  Upon hospital discharge, pt would benefit from continuing [place] and initial 24/7 supervision from family (and assist with stairs) and then intermittent supervision/assist as needed (discussed with pt).    Recommendations for follow up therapy are one component of a multi-disciplinary discharge planning process, led by the attending physician.  Recommendations may be updated based on patient status, additional functional criteria and insurance authorization.  Follow Up Recommendations Home health PT (continue HHPT)    Assistance Recommended at Discharge Other (comment) (Initial 24 hour supervision then intermittent supervision/assistance as needed)  Functional Status Assessment Patient has had a recent decline in their functional status and demonstrates the ability to  make significant improvements in function in a reasonable and predictable amount of time.  Equipment Recommendations   (pt has rollator and RW at home already)    Recommendations for Other Services OT consult     Precautions / Restrictions Precautions Precautions: Fall Restrictions Weight Bearing Restrictions: No      Mobility  Bed Mobility               General bed mobility comments: Deferred (pt sitting on edge of stretcher bed eating beginning/end of session with daughter present)    Transfers Overall transfer level: Needs assistance Equipment used: Rolling walker (2 wheels) Transfers: Sit to/from Stand Sit to Stand: Supervision           General transfer comment: steady safe transfers noted with RW use    Ambulation/Gait Ambulation/Gait assistance: Min guard Gait Distance (Feet): 140 Feet Assistive device: Rolling walker (2 wheels)   Gait velocity: decreased   General Gait Details: increasing antalgic gait with increased distance ambulating (decreased stance time L LE); steady with RW use  Stairs            Wheelchair Mobility    Modified Rankin (Stroke Patients Only)       Balance Overall balance assessment: Needs assistance Sitting-balance support: No upper extremity supported;Feet supported Sitting balance-Leahy Scale: Normal Sitting balance - Comments: steady sitting reaching outside BOS   Standing balance support: No upper extremity supported Standing balance-Leahy Scale: Good Standing balance comment: steady standing reaching within BOS                             Pertinent Vitals/Pain Pain Assessment: No/denies pain O2 sats 90% or greater on room air during sessions activities (unable to get  O2 reading immediately post ambulation d/t pt's fingers cold though). HR 100-124 bpm during sessions activities.    Home Living Family/patient expects to be discharged to:: Private residence Living Arrangements:  Alone Available Help at Discharge: Family;Available PRN/intermittently Type of Home: House Home Access: Stairs to enter Entrance Stairs-Rails: Left Entrance Stairs-Number of Steps: 1-2   Home Layout: One level Home Equipment: Conservation officer, nature (2 wheels);Rollator (4 wheels);Shower seat;Cane - quad;Cane - single point;Hand held shower head      Prior Function Prior Level of Function : Independent/Modified Independent             Mobility Comments: Ambulates with use of rollator inside of home (uses RW outside of home).  Receiving HHPT 1x/week prior to admission. ADLs Comments: Per OT eval "Pt reports her daugther assists her with IADL tasks and taking her to appointments. Pt reports mod I for self care and functional transfers. She has an aide that comes 1x/wk but reports "they don't do anything"."     Hand Dominance        Extremity/Trunk Assessment   Upper Extremity Assessment Upper Extremity Assessment: Overall WFL for tasks assessed    Lower Extremity Assessment Lower Extremity Assessment: Generalized weakness    Cervical / Trunk Assessment Cervical / Trunk Assessment: Normal  Communication   Communication: HOH  Cognition Arousal/Alertness: Awake/alert Behavior During Therapy: WFL for tasks assessed/performed Overall Cognitive Status: Within Functional Limits for tasks assessed                                 General Comments: Pleasant and cooparative.  A&O x4.        General Comments  Nursing cleared pt for participation in physical therapy.  Pt agreeable to PT session.  Pt's daughter present during session.    Exercises     Assessment/Plan    PT Assessment Patient needs continued PT services  PT Problem List Decreased mobility;Decreased activity tolerance       PT Treatment Interventions DME instruction;Gait training;Stair training;Functional mobility training;Therapeutic activities;Therapeutic exercise;Balance training;Patient/family  education    PT Goals (Current goals can be found in the Care Plan section)  Acute Rehab PT Goals Patient Stated Goal: to improve mobility PT Goal Formulation: With patient Time For Goal Achievement: 06/27/21 Potential to Achieve Goals: Good    Frequency Min 2X/week   Barriers to discharge        Co-evaluation               AM-PAC PT "6 Clicks" Mobility  Outcome Measure Help needed turning from your back to your side while in a flat bed without using bedrails?: None Help needed moving from lying on your back to sitting on the side of a flat bed without using bedrails?: None Help needed moving to and from a bed to a chair (including a wheelchair)?: A Little Help needed standing up from a chair using your arms (e.g., wheelchair or bedside chair)?: A Little Help needed to walk in hospital room?: A Little Help needed climbing 3-5 steps with a railing? : A Little 6 Click Score: 20    End of Session Equipment Utilized During Treatment: Gait belt Activity Tolerance: Patient tolerated treatment well Patient left: with call bell/phone within reach (sitting on edge of stretcher bed eating (daughter present)) Nurse Communication: Mobility status;Precautions PT Visit Diagnosis: Other abnormalities of gait and mobility (R26.89);Muscle weakness (generalized) (M62.81)    Time: 1330-1400 PT Time Calculation (  min) (ACUTE ONLY): 30 min   Charges:   PT Evaluation $PT Eval Low Complexity: 1 Low         Hubert Raatz, PT 06/13/21, 3:19 PM

## 2021-06-13 NOTE — ED Notes (Signed)
Family member at bedside at this time.

## 2021-06-13 NOTE — ED Notes (Signed)
Pt ambulated to toilet using walker. Steady gait noted. Pt now back in bed.

## 2021-06-13 NOTE — Progress Notes (Signed)
SLP Cancellation Note  Patient Details Name: MARYLYN APPENZELLER MRN: [id number] DOB: 03-Jul-1931   Cancelled treatment:       Reason Eval/Treat Not Completed: SLP screened, no needs identified, will sign off (chart reviewed; consulted NSG). Pt denied any difficulty swallowing and is currently on a regular diet; tolerates swallowing pills w/ water per NSG. Pt conversed in general conversation w/out expressive/receptive deficits noted; pt denied any speech-language deficits. Speech clear. No further skilled ST services indicated as pt appears at her baseline. Pt agreed. NSG to reconsult if any change in status while admitted.        Orinda Kenner, MS, CCC-SLP Speech Language Pathologist Rehab Services [phone] [facility] 06/13/2021, 1:23 PM

## 2021-06-13 NOTE — Care Management CC44 (Signed)
Condition Code 44 Documentation Completed  Patient Details  Name: Kellie Harris MRN: [id number] Date of Birth: [birth year]   Condition Code 44 given:  Yes Patient signature on Condition Code 44 notice:  Yes Documentation of 2 MD's agreement:  Yes Code 44 added to claim:  Yes    Adelene Amas, [place] 06/13/2021, 12:43 PM

## 2021-06-13 NOTE — ED Notes (Signed)
PT now at bedside with pt.

## 2021-06-13 NOTE — ED Notes (Signed)
Social work on phone, requested to speak to pt, pt deferred to daughter. Social Worker speaking to daughter at this time.

## 2021-06-13 NOTE — ED Notes (Signed)
Pt assisted to toilet & back to bed.

## 2021-06-13 NOTE — ED Notes (Signed)
Hospitalist was at bedside with pt and discharge orders will be placed once utilization review is complete.

## 2021-06-13 NOTE — Care Management Obs Status (Signed)
[place] NOTIFICATION   Patient Details  Name: Kellie Harris MRN: [id number] Date of Birth: 09-12-1930   Medicare Observation Status Notification Given:  Yes    Ova Freshwater 06/13/2021, 12:43 PM

## 2021-06-16 DIAGNOSIS — M8580 Other specified disorders of bone density and structure, unspecified site: Secondary | ICD-10-CM | POA: Diagnosis not present

## 2021-06-16 DIAGNOSIS — Z85828 Personal history of other malignant neoplasm of skin: Secondary | ICD-10-CM | POA: Diagnosis not present

## 2021-06-16 DIAGNOSIS — K5790 Diverticulosis of intestine, part unspecified, without perforation or abscess without bleeding: Secondary | ICD-10-CM | POA: Diagnosis not present

## 2021-06-16 DIAGNOSIS — I10 Essential (primary) hypertension: Secondary | ICD-10-CM | POA: Diagnosis not present

## 2021-06-16 DIAGNOSIS — I69320 Aphasia following cerebral infarction: Secondary | ICD-10-CM | POA: Diagnosis not present

## 2021-06-16 DIAGNOSIS — M1990 Unspecified osteoarthritis, unspecified site: Secondary | ICD-10-CM | POA: Diagnosis not present

## 2021-06-16 DIAGNOSIS — K449 Diaphragmatic hernia without obstruction or gangrene: Secondary | ICD-10-CM | POA: Diagnosis not present

## 2021-06-16 DIAGNOSIS — Z96651 Presence of right artificial knee joint: Secondary | ICD-10-CM | POA: Diagnosis not present

## 2021-06-16 DIAGNOSIS — M5136 Other intervertebral disc degeneration, lumbar region: Secondary | ICD-10-CM | POA: Diagnosis not present

## 2021-06-16 DIAGNOSIS — R269 Unspecified abnormalities of gait and mobility: Secondary | ICD-10-CM | POA: Diagnosis not present

## 2021-06-16 DIAGNOSIS — Z791 Long term (current) use of non-steroidal anti-inflammatories (NSAID): Secondary | ICD-10-CM | POA: Diagnosis not present

## 2021-06-16 DIAGNOSIS — K589 Irritable bowel syndrome without diarrhea: Secondary | ICD-10-CM | POA: Diagnosis not present

## 2021-06-16 DIAGNOSIS — E785 Hyperlipidemia, unspecified: Secondary | ICD-10-CM | POA: Diagnosis not present

## 2021-06-16 DIAGNOSIS — E7800 Pure hypercholesterolemia, unspecified: Secondary | ICD-10-CM | POA: Diagnosis not present

## 2021-06-16 DIAGNOSIS — Z7982 Long term (current) use of aspirin: Secondary | ICD-10-CM | POA: Diagnosis not present

## 2021-06-19 DIAGNOSIS — Z87898 Personal history of other specified conditions: Secondary | ICD-10-CM | POA: Diagnosis not present

## 2021-06-19 DIAGNOSIS — I10 Essential (primary) hypertension: Secondary | ICD-10-CM | POA: Diagnosis not present

## 2021-06-29 DIAGNOSIS — E785 Hyperlipidemia, unspecified: Secondary | ICD-10-CM | POA: Diagnosis not present

## 2021-06-29 DIAGNOSIS — K5790 Diverticulosis of intestine, part unspecified, without perforation or abscess without bleeding: Secondary | ICD-10-CM | POA: Diagnosis not present

## 2021-06-29 DIAGNOSIS — I10 Essential (primary) hypertension: Secondary | ICD-10-CM | POA: Diagnosis not present

## 2021-06-29 DIAGNOSIS — Z791 Long term (current) use of non-steroidal anti-inflammatories (NSAID): Secondary | ICD-10-CM | POA: Diagnosis not present

## 2021-06-29 DIAGNOSIS — Z7982 Long term (current) use of aspirin: Secondary | ICD-10-CM | POA: Diagnosis not present

## 2021-06-29 DIAGNOSIS — Z96651 Presence of right artificial knee joint: Secondary | ICD-10-CM | POA: Diagnosis not present

## 2021-06-29 DIAGNOSIS — M8580 Other specified disorders of bone density and structure, unspecified site: Secondary | ICD-10-CM | POA: Diagnosis not present

## 2021-06-29 DIAGNOSIS — K589 Irritable bowel syndrome without diarrhea: Secondary | ICD-10-CM | POA: Diagnosis not present

## 2021-06-29 DIAGNOSIS — R269 Unspecified abnormalities of gait and mobility: Secondary | ICD-10-CM | POA: Diagnosis not present

## 2021-06-29 DIAGNOSIS — E7800 Pure hypercholesterolemia, unspecified: Secondary | ICD-10-CM | POA: Diagnosis not present

## 2021-06-29 DIAGNOSIS — M5136 Other intervertebral disc degeneration, lumbar region: Secondary | ICD-10-CM | POA: Diagnosis not present

## 2021-06-29 DIAGNOSIS — M1990 Unspecified osteoarthritis, unspecified site: Secondary | ICD-10-CM | POA: Diagnosis not present

## 2021-06-29 DIAGNOSIS — K449 Diaphragmatic hernia without obstruction or gangrene: Secondary | ICD-10-CM | POA: Diagnosis not present

## 2021-06-29 DIAGNOSIS — Z85828 Personal history of other malignant neoplasm of skin: Secondary | ICD-10-CM | POA: Diagnosis not present

## 2021-06-29 DIAGNOSIS — I69320 Aphasia following cerebral infarction: Secondary | ICD-10-CM | POA: Diagnosis not present

## 2021-07-12 DIAGNOSIS — Z0000 Encounter for general adult medical examination without abnormal findings: Secondary | ICD-10-CM | POA: Diagnosis not present

## 2021-07-12 DIAGNOSIS — E782 Mixed hyperlipidemia: Secondary | ICD-10-CM | POA: Diagnosis not present

## 2021-07-13 DIAGNOSIS — M1612 Unilateral primary osteoarthritis, left hip: Secondary | ICD-10-CM | POA: Diagnosis not present

## 2021-07-14 DIAGNOSIS — Z85828 Personal history of other malignant neoplasm of skin: Secondary | ICD-10-CM | POA: Diagnosis not present

## 2021-07-14 DIAGNOSIS — K449 Diaphragmatic hernia without obstruction or gangrene: Secondary | ICD-10-CM | POA: Diagnosis not present

## 2021-07-14 DIAGNOSIS — M8580 Other specified disorders of bone density and structure, unspecified site: Secondary | ICD-10-CM | POA: Diagnosis not present

## 2021-07-14 DIAGNOSIS — Z791 Long term (current) use of non-steroidal anti-inflammatories (NSAID): Secondary | ICD-10-CM | POA: Diagnosis not present

## 2021-07-14 DIAGNOSIS — M5136 Other intervertebral disc degeneration, lumbar region: Secondary | ICD-10-CM | POA: Diagnosis not present

## 2021-07-14 DIAGNOSIS — E785 Hyperlipidemia, unspecified: Secondary | ICD-10-CM | POA: Diagnosis not present

## 2021-07-14 DIAGNOSIS — Z7982 Long term (current) use of aspirin: Secondary | ICD-10-CM | POA: Diagnosis not present

## 2021-07-14 DIAGNOSIS — I69320 Aphasia following cerebral infarction: Secondary | ICD-10-CM | POA: Diagnosis not present

## 2021-07-14 DIAGNOSIS — E7800 Pure hypercholesterolemia, unspecified: Secondary | ICD-10-CM | POA: Diagnosis not present

## 2021-07-14 DIAGNOSIS — M1990 Unspecified osteoarthritis, unspecified site: Secondary | ICD-10-CM | POA: Diagnosis not present

## 2021-07-14 DIAGNOSIS — K589 Irritable bowel syndrome without diarrhea: Secondary | ICD-10-CM | POA: Diagnosis not present

## 2021-07-14 DIAGNOSIS — R269 Unspecified abnormalities of gait and mobility: Secondary | ICD-10-CM | POA: Diagnosis not present

## 2021-07-14 DIAGNOSIS — K5790 Diverticulosis of intestine, part unspecified, without perforation or abscess without bleeding: Secondary | ICD-10-CM | POA: Diagnosis not present

## 2021-07-14 DIAGNOSIS — I10 Essential (primary) hypertension: Secondary | ICD-10-CM | POA: Diagnosis not present

## 2021-07-14 DIAGNOSIS — Z96651 Presence of right artificial knee joint: Secondary | ICD-10-CM | POA: Diagnosis not present

## 2021-07-17 DIAGNOSIS — D0439 Carcinoma in situ of skin of other parts of face: Secondary | ICD-10-CM | POA: Diagnosis not present

## 2021-07-26 NOTE — Progress Notes (Signed)
Ptt is for anticipated tpa therapy or for code stroke.

## 2021-08-02 DIAGNOSIS — L988 Other specified disorders of the skin and subcutaneous tissue: Secondary | ICD-10-CM | POA: Diagnosis not present

## 2021-08-02 DIAGNOSIS — L578 Other skin changes due to chronic exposure to nonionizing radiation: Secondary | ICD-10-CM | POA: Diagnosis not present

## 2021-08-02 DIAGNOSIS — C44311 Basal cell carcinoma of skin of nose: Secondary | ICD-10-CM | POA: Diagnosis not present

## 2021-08-02 DIAGNOSIS — L814 Other melanin hyperpigmentation: Secondary | ICD-10-CM | POA: Diagnosis not present

## 2021-08-04 DIAGNOSIS — M87852 Other osteonecrosis, left femur: Secondary | ICD-10-CM | POA: Diagnosis not present

## 2021-08-10 DIAGNOSIS — M5136 Other intervertebral disc degeneration, lumbar region: Secondary | ICD-10-CM | POA: Diagnosis not present

## 2021-08-10 DIAGNOSIS — E785 Hyperlipidemia, unspecified: Secondary | ICD-10-CM | POA: Diagnosis not present

## 2021-08-10 DIAGNOSIS — K449 Diaphragmatic hernia without obstruction or gangrene: Secondary | ICD-10-CM | POA: Diagnosis not present

## 2021-08-10 DIAGNOSIS — K589 Irritable bowel syndrome without diarrhea: Secondary | ICD-10-CM | POA: Diagnosis not present

## 2021-08-10 DIAGNOSIS — R269 Unspecified abnormalities of gait and mobility: Secondary | ICD-10-CM | POA: Diagnosis not present

## 2021-08-10 DIAGNOSIS — Z7982 Long term (current) use of aspirin: Secondary | ICD-10-CM | POA: Diagnosis not present

## 2021-08-10 DIAGNOSIS — Z85828 Personal history of other malignant neoplasm of skin: Secondary | ICD-10-CM | POA: Diagnosis not present

## 2021-08-10 DIAGNOSIS — I69320 Aphasia following cerebral infarction: Secondary | ICD-10-CM | POA: Diagnosis not present

## 2021-08-10 DIAGNOSIS — K5790 Diverticulosis of intestine, part unspecified, without perforation or abscess without bleeding: Secondary | ICD-10-CM | POA: Diagnosis not present

## 2021-08-10 DIAGNOSIS — M1990 Unspecified osteoarthritis, unspecified site: Secondary | ICD-10-CM | POA: Diagnosis not present

## 2021-08-10 DIAGNOSIS — Z791 Long term (current) use of non-steroidal anti-inflammatories (NSAID): Secondary | ICD-10-CM | POA: Diagnosis not present

## 2021-08-10 DIAGNOSIS — M8580 Other specified disorders of bone density and structure, unspecified site: Secondary | ICD-10-CM | POA: Diagnosis not present

## 2021-08-10 DIAGNOSIS — I10 Essential (primary) hypertension: Secondary | ICD-10-CM | POA: Diagnosis not present

## 2021-08-10 DIAGNOSIS — Z96651 Presence of right artificial knee joint: Secondary | ICD-10-CM | POA: Diagnosis not present

## 2021-08-10 DIAGNOSIS — E7800 Pure hypercholesterolemia, unspecified: Secondary | ICD-10-CM | POA: Diagnosis not present

## 2021-08-17 DIAGNOSIS — E46 Unspecified protein-calorie malnutrition: Secondary | ICD-10-CM | POA: Diagnosis not present

## 2021-08-17 DIAGNOSIS — F0390 Unspecified dementia without behavioral disturbance: Secondary | ICD-10-CM | POA: Diagnosis not present

## 2021-08-17 DIAGNOSIS — I69351 Hemiplegia and hemiparesis following cerebral infarction affecting right dominant side: Secondary | ICD-10-CM | POA: Diagnosis not present

## 2021-08-17 DIAGNOSIS — I739 Peripheral vascular disease, unspecified: Secondary | ICD-10-CM | POA: Diagnosis not present

## 2021-08-23 ENCOUNTER — Encounter
Admission: RE | Admit: 2021-08-23 | Discharge: 2021-08-23 | Disposition: A | Discharge status: Home / Self Care | Payer: PPO | Source: Ambulatory Visit | Attending: Orthopedic Surgery | Admitting: Orthopedic Surgery

## 2021-08-23 ENCOUNTER — Encounter: Payer: Self-pay | Admitting: Orthopedic Surgery

## 2021-08-23 ENCOUNTER — Other Ambulatory Visit: Payer: Self-pay | Admitting: Orthopedic Surgery

## 2021-08-23 ENCOUNTER — Other Ambulatory Visit: Payer: Self-pay

## 2021-08-23 ENCOUNTER — Inpatient Hospital Stay: Admission: RE | Admit: 2021-08-23 | Payer: PPO | Source: Ambulatory Visit

## 2021-08-23 DIAGNOSIS — Z01818 Encounter for other preprocedural examination: Secondary | ICD-10-CM

## 2021-08-23 DIAGNOSIS — R54 Age-related physical debility: Secondary | ICD-10-CM | POA: Diagnosis not present

## 2021-08-23 DIAGNOSIS — Z01812 Encounter for preprocedural laboratory examination: Secondary | ICD-10-CM

## 2021-08-23 DIAGNOSIS — Z01810 Encounter for preprocedural cardiovascular examination: Secondary | ICD-10-CM | POA: Diagnosis not present

## 2021-08-23 HISTORY — DX: Cerebral infarction, unspecified: I63.9

## 2021-08-23 LAB — BASIC METABOLIC PANEL
Anion gap: 9 (ref 5–15)
BUN: 14 mg/dL (ref 8–23)
CO2: 26 mmol/L (ref 22–32)
Calcium: 9.7 mg/dL (ref 8.9–10.3)
Chloride: 102 mmol/L (ref 98–111)
Creatinine, Ser: 0.73 mg/dL (ref 0.44–1.00)
GFR, Estimated: >60 mL/min (ref >60)
Glucose, Bld: 98 mg/dL (ref 70–99)
Potassium: 4.2 mmol/L (ref 3.5–5.1)
Sodium: 137 mmol/L (ref 135–145)

## 2021-08-23 LAB — PROTIME-INR
INR: 1 (ref 0.8–1.2)
Prothrombin Time: 13.7 seconds (ref 11.4–15.2)

## 2021-08-23 LAB — URINALYSIS, ROUTINE W REFLEX MICROSCOPIC
Bilirubin Urine: NEGATIVE
Glucose, UA: NEGATIVE mg/dL
Hgb urine dipstick: NEGATIVE
Ketones, ur: NEGATIVE mg/dL
Leukocytes,Ua: NEGATIVE
Nitrite: NEGATIVE
Protein, ur: NEGATIVE mg/dL
Specific Gravity, Urine: 1.004 — ABNORMAL LOW (ref 1.005–1.030)
pH: 6 (ref 5.0–8.0)

## 2021-08-23 LAB — CBC
HCT: 41.2 % (ref 36.0–46.0)
Hemoglobin: 13.7 g/dL (ref 12.0–15.0)
MCH: 30.4 pg (ref 26.0–34.0)
MCHC: 33.3 g/dL (ref 30.0–36.0)
MCV: 91.4 fL (ref 80.0–100.0)
Platelets: 280 10*3/uL (ref 150–400)
RBC: 4.51 MIL/uL (ref 3.87–5.11)
RDW: 14.4 % (ref 11.5–15.5)
WBC: 11.3 10*3/uL — ABNORMAL HIGH (ref 4.0–10.5)
nRBC: 0 % (ref 0.0–0.2)

## 2021-08-23 LAB — SURGICAL PCR SCREEN
MRSA, PCR: NEGATIVE
Staphylococcus aureus: NEGATIVE

## 2021-08-23 LAB — APTT: aPTT: 32 seconds (ref 24–36)

## 2021-08-23 NOTE — H&P (Signed)
NAME: Kellie Harris MRN:   [id number] DOB:   11-22-1930     HISTORY AND PHYSICAL  CHIEF COMPLAINT:  left painful hardware and hip  HISTORY:   Kellie Harris a 88 y.o. female  with left  Hip Pain Patient complains of left hip pain. Onset of the symptoms was several years ago. Inciting event: known DJD. The patient reports the hip pain is worse with weight bearing. Associated symptoms: none, injury. Aggravating symptoms include: any weight bearing, going up and down stairs, and inactivity. Patient has had prior hip problems. Previous visits for this problem: multiple, this is a longstanding diagnosis. Last seen several weeks ago by Dr. Harlow Mares . Evaluation to date: plain films, which were abnormal  osteoarthritis . Treatment to date: OTC analgesics, which have been somewhat effective, prescription analgesics, which have been somewhat effective, home exercise program, which has been somewhat effective, and physical therapy, which has been somewhat effective.  Plan for left total hip replacement  PAST MEDICAL HISTORY:   Past Medical History:  Diagnosis Date   Arthritis    right knee, left shoulder/ OSTEO   Cancer ([place])    skin    Degenerative disc disease, lumbar    L4-L5   Diverticulosis    ITIS   History of hiatal hernia    Hydronephrosis    right   Hypercholesteremia    Hypertension    CONTROLLED ON MEDS   IBS (irritable bowel syndrome)    Knee fracture, right 03/05/2015   DIFFICULTY WITH AMBULATION   Osteopenia    Ovarian cyst    Pancreatic insufficiency    Wears hearing aid    bilateral    PAST SURGICAL HISTORY:   Past Surgical History:  Procedure Laterality Date   CATARACT EXTRACTION W/PHACO Right 11/02/2015   Procedure: CATARACT EXTRACTION PHACO AND INTRAOCULAR LENS PLACEMENT ([place]);  Surgeon: Leandrew Koyanagi, MD;  Location: [facility];  Service: Ophthalmology;  Laterality: Right;   CATARACT EXTRACTION W/PHACO Left 07/18/2016   Procedure: CATARACT EXTRACTION PHACO  AND INTRAOCULAR LENS PLACEMENT (I[facility]);  Surgeon: Leandrew Koyanagi, MD;  Location: [facility];  Service: Ophthalmology;  Laterality: Left;   COLONOSCOPY     FRACTURE SURGERY Left    hip   history of left hip fracture     INTRAMEDULLARY (IM) NAIL INTERTROCHANTERIC Left 02/12/2017   Procedure: INTRAMEDULLARY (IM) NAIL INTERTROCHANTRIC;  Surgeon: Earnestine Leys, MD;  Location: [facility];  Service: Orthopedics;  Laterality: Left;   kidney stent     OOPHORECTOMY Bilateral 2015   ORIF WRIST FRACTURE Right 04/08/2018   Procedure: OPEN REDUCTION INTERNAL FIXATION (ORIF) WRIST FRACTURE;  Surgeon: Altamese [place]ville, MD;  Location: [facility];  Service: Orthopedics;  Laterality: Right;   TONSILLECTOMY     TOTAL KNEE ARTHROPLASTY Right 08/07/2017   Procedure: TOTAL KNEE ARTHROPLASTY;  Surgeon: Earnestine Leys, MD;  Location: [facility];  Service: Orthopedics;  Laterality: Right;    MEDICATIONS:  (Not in a hospital admission)   ALLERGIES:  No Known Allergies  REVIEW OF SYSTEMS:   Negative except HPI  FAMILY HISTORY:   Family History  Problem Relation Age of Onset   Breast cancer Mother 21   Leukemia Mother    Stroke Father     SOCIAL HISTORY:   reports that she has never smoked. She has never used smokeless tobacco. She reports that she does not drink alcohol and does not use drugs.  PHYSICAL EXAM:  General appearance: alert, cooperative, and no distress Neck: no JVD and  supple, symmetrical, trachea midline Resp: clear to auscultation bilaterally Cardio: regular rate and rhythm, S1, S2 normal, no murmur, click, rub or gallop GI: soft, non-tender; bowel sounds normal; no masses,  no organomegaly Extremities: extremities normal, atraumatic, no cyanosis or edema and Homans sign is negative, no sign of DVT Pulses: 2+ and symmetric Skin: Skin color, texture, turgor normal. No rashes or lesions Neurologic: Alert and oriented X 3, normal strength and tone. Normal symmetric reflexes. Normal  coordination and gait    LABORATORY STUDIES: No results for input(s): WBC, HGB, HCT, PLT in the last 72 hours.  No results for input(s): NA, K, CL, CO2, GLUCOSE, BUN, CREATININE, CALCIUM in the last 72 hours.  STUDIES/RESULTS:  No results found.  ASSESSMENT:  End stage osteoarthritis left hip        Active Problems:   * No active hospital problems. *    PLAN:  Left Primary Total Hip with hardware removal   [name] 08/23/2021. 12:26 PM

## 2021-08-23 NOTE — Patient Instructions (Addendum)
Your procedure is scheduled on: 09/04/21 - Monday Report to the Registration Desk on the 1st floor of the [facility]. To find out your arrival time, please call [phone] between 1PM - 3PM on: 09/01/21 - Friday  Report To [place] for Covid Test on 08/31/21 at 8:30 am.  REMEMBER: Instructions that are not followed completely may result in serious medical risk, up to and including death; or upon the discretion of your surgeon and anesthesiologist your surgery may need to be rescheduled.  Do not eat food after midnight the night before surgery.  No gum chewing, lozengers or hard candies.  You may however, drink CLEAR liquids up to 2 hours before you are scheduled to arrive for your surgery. Do not drink anything within 2 hours of your scheduled arrival time.  Clear liquids include: - water  - apple juice without pulp - gatorade (not RED, PURPLE, OR BLUE) - black coffee or tea (Do NOT add milk or creamers to the coffee or tea) Do NOT drink anything that is not on this list.  Type 1 and Type 2 diabetics should only drink water.  TAKE THESE MEDICATIONS THE MORNING OF SURGERY WITH A SIP OF WATER:  - amLODipine (NORVASC) 5 MG tablet - metoprolol succinate (TOPROL-XL) 25 MG 24 hr tablet  Follow recommendations from Cardiologist, Pulmonologist or PCP regarding stopping Aspirin, Coumadin, Plavix, Eliquis, Pradaxa, or Pletal. Stop taking Aspirin 81 mg beginning 08/30/21.  One week prior to surgery: Stop Anti-inflammatories (NSAIDS) such as Advil, Aleve, Ibuprofen, Motrin, Naproxen, Naprosyn and Aspirin based products such as Excedrin, Goodys Powder, BC Powder.  Stop ANY OVER THE COUNTER supplements until after surgery.  You may however, continue to take Tylenol if needed for pain up until the day of surgery.  No Alcohol for 24 hours before or after surgery.  No Smoking including e-cigarettes for 24 hours prior to surgery.  No chewable tobacco products for at least 6  hours prior to surgery.  No nicotine patches on the day of surgery.  Do not use any "recreational" drugs for at least a week prior to your surgery.  Please be advised that the combination of cocaine and anesthesia may have negative outcomes, up to and including death. If you test positive for cocaine, your surgery will be cancelled.  On the morning of surgery brush your teeth with toothpaste and water, you may rinse your mouth with mouthwash if you wish. Do not swallow any toothpaste or mouthwash.  Use CHG Soap or wipes as directed on instruction sheet.  Do not wear jewelry, make-up, hairpins, clips or nail polish.  Do not wear lotions, powders, or perfumes.   Do not shave body from the neck down 48 hours prior to surgery just in case you cut yourself which could leave a site for infection.  Also, freshly shaved skin may become irritated if using the CHG soap.  Contact lenses, hearing aids and dentures may not be worn into surgery.  Do not bring valuables to the hospital. [facility] is not responsible for any missing/lost belongings or valuables.   Notify your doctor if there is any change in your medical condition (cold, fever, infection).  Wear comfortable clothing (specific to your surgery type) to the hospital.  After surgery, you can help prevent lung complications by doing breathing exercises.  Take deep breaths and cough every 1-2 hours. Your doctor may order a device called an Incentive Spirometer to help you take deep breaths. When coughing or sneezing, hold  a pillow firmly against your incision with both hands. This is called splinting. Doing this helps protect your incision. It also decreases belly discomfort.  If you are being admitted to the hospital overnight, leave your suitcase in the car. After surgery it may be brought to your room.  If you are being discharged the day of surgery, you will not be allowed to drive home. You will need a responsible adult (18  years or older) to drive you home and stay with you that night.   If you are taking public transportation, you will need to have a responsible adult (18 years or older) with you. Please confirm with your physician that it is acceptable to use public transportation.   Please call the [place] Dept. at [phone] if you have any questions about these instructions.  Surgery Visitation Policy:  Patients undergoing a surgery or procedure may have one family member or support person with them as long as that person is not COVID-19 positive or experiencing its symptoms.  That person may remain in the waiting area during the procedure and may rotate out with other people.  Inpatient Visitation:    Visiting hours are 7 a.m. to 8 p.m. Up to two visitors ages 16+ are allowed at one time in a patient room. The visitors may rotate out with other people during the day. Visitors must check out when they leave, or other visitors will not be allowed. One designated support person may remain overnight. The visitor must pass COVID-19 screenings, use hand sanitizer when entering and exiting the patients room and wear a mask at all times, including in the patients room. Patients must also wear a mask when staff or their visitor are in the room. Masking is required regardless of vaccination status.

## 2021-08-24 NOTE — Pre-Procedure Instructions (Signed)
Progress Notes - documented in this encounter Kellie Harris, [place] - 06/07/2021 2:00 PM EDT Formatting of this note is different from the original. Images from the original note were not included. Today the history is gathered from: 100% - patient  0% - patient alone today  REFERRING PHYSICIAN: Dion Body, MD PRIMARY CARE PHYSICIAN: Dion Body, MD  IMPRESSION/PLAN  Kellie Harris is a 88 y.o. female presenting for evaluation of  LIGHTHEADEDNESS / HISTORY OF STROKE - Ongoing. - Patient with history of stroke. Takes aspirin 81mg  daily; no new stroke like symptoms. Endorsing ongoing episodes of lightheadedness and weakness. - BP today 154/90 on manual recheck.  - Continue aspirin 81mg  daily. Monitor for signs of bleed. - Monitor BP at home. Follow up with PCP regarding BP fluctuations. - Patient should know signs and symptoms of stroke, importance of calling 911 as soon as the symptoms start so patient can be a potential candidate for tPA. We also discussed patient's personal stroke risk factors. We gave written educational material, if needed. - Make sure to drink at least 8 8oz glasses of water daily. Eat small snacks throughout the day as well. - Use slow transitions from sitting to standing and standing to walking. - Refer for home PT for generalized weakness, left hip pain.  SHOULDER & HIP PAIN - Stable. - Patient with chronic hip and shoulder pain, ongoing.  - Can try tylenol, topical voltaren or lidocaine, warm compresses for left hip pain. - Follow up with orthopedics.  Follow up 1 year.  CHIEF COMPLAIN & HISTORY OF PRESENT ILLNESS:  Kellie Harris is a 88 y.o. female presenting for evaluation of: Chief Complaint  Patient presents with   Dizziness   LIGHTHEADEDNESS / HISTORY OF STROKE Patient reports no new stroke like symptoms. Says she has noticed more weakness the past weak, having a harder time getting out of chair. No falls.This is a chronic issue. States  this is not unilateral weakness, just throughout her body. Continues to have some lightheadedness, this does not occur every day. Thinks this usually occurs when going from sitting to standing. When she had stroke, she had symptoms of severe dizziness. Has not felt that kind of dizziness since. Bp fluctuates a lot at home. Notices that BP can be very high during episodes of lightheadedness. Taking aspirin 81 mg daily, some mild bruising. Takes meclizine as needed for dizziness, says this is slightly helpful but she usually tries to lie down when she feels lightheaded.  SHOULDER & LEFT HIP PAIN  Patient with history of broken left hip, says she has broken it twice. Has arthritis in both shoulders and reports this hurts every day. Mostly painful when she is standing or walking for extended periods. Hip pain can affect her balance, using a rollator today. No falls. Patient has completed PT and OT. Not currently seeing orthopedics. Has had injections in shoulders before which was helpful. Takes tylenol which helps some.  DATA SUMMARY: 05/04/2020 MRI BRAIN WO IMPRESSION:  1. No acute intracranial abnormality.  2. Chronic left PCA territory infarct, with multiple additional remote lacunar infarcts involving the hemispheric cerebral white matter, bilateral thalami, and right greater than left cerebellum.  3. Underlying age-related cerebral atrophy with moderate chronic small vessel ischemic disease.   05/03/2020  CT HEAD IMPRESSION:  No CT evidence of acute intracranial abnormality. APSECTS is 10 (when discounting chronic infarcts).  Stable generalized parenchymal atrophy and advanced cerebral white matter chronic small vessel ischemic disease.  Known chronic infarcts within the  thalami and right basal ganglia/hemispheric cerebral white matter as well as cerebellum.  Left maxillary sinusitis, incompletely imaged.   04/03/2018 CT HEAD CODE STROKE  1. No acute intracranial infarct or other  abnormality. 2. ASPECTS is 10. 3. Atrophy with extensive chronic ischemic changes as above, stable from previous.  04/03/18 CT ANGIO HEAD/ NECK W OR WO CONTRAST  1. Negative CTA for large vessel occlusion. 2. Failed and nondiagnostic CT perfusion. 3. atheromatous change involving the major arterial vasculature of the head and neck, stable relative to previous CTA from 11/04/2017. Changes most notable within the bilateral V4 segments were there are mild to moderate stenoses. No other hemodynamically significant or correctable stenosis identified. 4. Stable 3 mm focal outpouching arising from the distal right M1 segment, possibly reflecting a small aneurysm versus vascular infundibulum. 5. Tortuosity of the major arterial vasculature within the neck, suggesting chronic underlying hypertension.  04/03/2018 CT CERBRAL PERFUSION W CONTRAST  1. Negative CTA for large vessel occlusion. 2. Failed and nondiagnostic CT perfusion. 3. atheromatous change involving the major arterial vasculature of the head and neck, stable relative to previous CTA from 11/04/2017. Changes most notable within the bilateral V4 segments were there are mild to moderate stenoses. No other hemodynamically significant or correctable stenosis identified. 4. Stable 3 mm focal outpouching arising from the distal right M1 segment, possibly reflecting a small aneurysm versus vascular infundibulum. 5. Tortuosity of the major arterial vasculature within the neck, suggesting chronic underlying hypertension.  04/04/18 MR BRAIN WO CONTRAST 1. No acute intracranial infarct or other abnormality identified. 2. Moderately advanced cerebral atrophy with chronic small vessel ischemic disease with scattered remote infarcts as above.  EEG 04/04/2018 1.  This is a normal recording of awake and drowsy states.  04/06/2018 ECHOCARDIOGRAM  - Left ventricle: The cavity size was normal. Wall thickness was   increased in a pattern of mild LVH. Systolic  function was normal.   The estimated ejection fraction was in the range of 60% to 65%.   Wall motion was normal; there were no regional wall motion   abnormalities. Doppler parameters are consistent with abnormal   left ventricular relaxation (grade 1 diastolic dysfunction). - Aortic valve: Moderately calcified annulus. Trileaflet;   moderately thickened leaflets. There was mild regurgitation.   Valve area (VTI): 3.46 cm^2. Valve area (Vmax): 2.95 cm^2. Valve   area (Vmean): 2.94 cm^2. - Mitral valve: Mildly calcified annulus. Normal thickness leaflets. - Technically adequate study.  11/04/2017  CT HEAD CODE STROKE  IMPRESSION:  1. No acute finding by CT. Atrophy and extensive chronic ischemic changes seen throughout as above. 2. ASPECTS is 10. 3. These results were communicated to [facility] 11/04/2017 via telephone.  11/04/2017  CT ANGIO HEAD W OR WO CONTRAST  IMPRESSION:  1. Negative CTA for emergent large vessel occlusion. No evidence for acute stroke by CT perfusion. 2. Mild atheromatous change involving the major arterial vasculature of the head and neck as above, most notable within the bilateral V4 segments were there are mild to moderate stenoses. No other hemodynamically significant or correctable stenosis identified. 3. 3 mm focal outpouching arising from the right M1 segment, suspicious for aneurysm.  11/04/2017  CT ANGIO NECK W OR WO CONTRAST  IMPRESSION: 1. Negative CTA for emergent large vessel occlusion. No evidence for acute stroke by CT perfusion. 2. Mild atheromatous change involving the major arterial vasculature of the head and neck as above, most notable within the bilateral V4 segments were there are mild to  moderate stenoses. No other hemodynamically significant or correctable stenosis identified. 3. 3 mm focal outpouching arising from the right M1 segment, suspicious for aneurysm.  11/05/2017  MR MRA HEAD/BRAIN WO CM  IMPRESSION:  Brain MRI: 1. No  acute finding.  No acute infarct. 2. Moderate chronic small vessel ischemic injury as described. 3. Chronic left maxillary sinusitis. Intracranial MRA: Stable from CTA yesterday. No emergent large vessel occlusion or proximal flow limiting stenosis.  11/05/2017  US CAROTID BILATERAL  IMPRESSION: Minor carotid atherosclerosis. No hemodynamically significant ICA stenosis. Degree of narrowing less than 50% bilaterally by ultrasound criteria. Patent antegrade vertebral flow bilaterally.  11/05/2017  ECHOCARDIOGRAM  IMPRESSION:  - Left ventricle: The cavity size was normal. Systolic function was normal. The estimated ejection fraction was in the range of 60% to 65%. - Aortic valve: There was mild regurgitation. Valve area (Vmax): 2.04 cm^2. - Mitral valve: There was mild regurgitation. Valve area by pressure half-time: 2.47 cm^2.  11/05/2017  EEG  IMPRESSION:  Normal electroencephalogram, awake, drowsy and with activation procedures. There are no focal lateralizing or epileptiform features.  02/11/2017  MRI BRAIN WO CONTRAST  IMPRESSION:  1. No acute intracranial process. 2. Old RIGHT basal ganglia, RIGHT thalamus and bilateral cerebella lacunar infarcts. 3. Moderate chronic small vessel ischemic disease.  02/11/2017  CT HEAD WO CONTRAST  IMPRESSION:  Chronic cerebral atrophy with small vessel ischemic disease of periventricular and subcortical white matter. No acute intracranial appearing abnormality by CT.  07/09/2014 US CAROTID BILATERAL IMPRESSION:  Less than 50% stenosis in the right and left internal carotid  arteries.  VISIT SUMMARIES: 08/24/19: Patient with weakness and dizziness, improving, and shoulder and hip pain, ongoing. Continue aspirin 81 mg daily. Start gabapentin 100 mg twice daily for pain.  05/20/19: Patent with spells of weakness, improving, and shoulder/hip pain, ongoing. Discontinue Plavix. Discontinue Keppra, as there is no indication that this is helping  her symptoms. Start taking aspirin 81 mg daily.   01/07/2018: One new spell in March of 2019. Taking Plavix 75 mg daily for blood thinning. Start Aspirin 81 mg daily for secondary stroke prevention. Will schedule a 72 hour EEG to evaluate for seizures if patient has an additional spell.   07/29/2017: New. Three lightheaded episodes this year. Taking Aspirin 81 mg daily.  MEDICATIONS Outpatient Medications Marked as Taking for the 06/07/21 encounter (Office Visit) with Kellie Falco, PA  Medication Sig   acetaminophen (TYLENOL) 500 MG tablet Take 1,000 mg by mouth every 8 (eight) hours as needed   amLODIPine (NORVASC) 5 MG tablet Take 1 tablet (5 mg total) by mouth once daily.   aspirin 81 MG EC tablet Take 81 mg by mouth once daily   cholecalciferol (VITAMIN D3) 1000 unit tablet Take by mouth once daily   lisinopriL (ZESTRIL) 20 MG tablet Take 1 tablet (20 mg total) by mouth 2 (two) times daily.   meclizine (ANTIVERT) 25 mg tablet Take 25 mg by mouth 3 (three) times daily as needed   metoprolol succinate (TOPROL-XL) 25 MG XL tablet Take 0.5 tablets (12.5 mg total) by mouth once daily   multivitamin tablet Take 1 tablet by mouth once daily   pravastatin (PRAVACHOL) 40 MG tablet Take 1 tablet (40 mg total) by mouth at bedtime   ALLERGIES No Known Allergies  EXAM   Vitals:  06/07/21 1434  BP: (!) 166/107  Pulse: 101  Weight: (!) 44.1 kg (97 lb 3.2 oz)  Height: 144.8 cm (4\' 9" )  PainSc: 4  Body mass index is 21.03 kg/m.  GENERAL: Pleasant female, no acute distress. Normocephalic and atraumatic.  Baseline neurological exam below was obtained at prior office visit. Changes from today's visit appear in bold.   MUSCULOSKELETAL: Bulk - Normal Tone - Normal Pronator Drift - Absent bilaterally. Ambulation - Gait and station are steady, using walker for assistance Romberg - not tested today  R/L 5/5 Shoulder abduction (deltoid/supraspinatus, axillary/suprascapular n,  C5) 5/5 Elbow flexion (biceps brachii, musculoskeletal n, C5-6) 5/5 Elbow extension (triceps, radial n, C7) 5/5 Finger adduction (interossei, ulnar n, T1)  5/5 Hip flexion (iliopsoas, L1/L2) 5/5 Knee flexion (hamstrings, sciatic n, L5/S1)  5/5 Knee extension (quadriceps, femoral n, L3/4) 5/5 Ankle dorsiflexion (tibialis anterior, deep fibular n, L4/5) 5/5 Ankle plantarflexion (gastroc, tibial n, S1)   NEUROLOGICAL: MENTAL STATUS: Patient is oriented to person, place and time.  Short-term memory is intact Long-term memory is intact.  Attention span and concentration are intact.  Naming and repetition are intact. Comprehension is intact.  Expressive speech is intact.  Patient's fund of knowledge is within normal limits for educational level.  CRANIAL NERVES: Visual acuity and visual fields are intact  Extraocular muscles are intact  Facial sensation is intact bilaterally  Facial strength is intact bilaterally  Hearing is intact bilaterally  Palate elevates midline, normal phonation   COORDINATION/CEREBELLAR: Finger to nose testing is not tested today   PAST MEDICAL HISTORY Past Medical History:  Diagnosis Date   Diverticulitis   Diverticulosis   H/O fracture of femur  Hx of fx of right femur condyle   Hemorrhoid   Hemorrhoids   History of cataract .   History of osteopenia  (last dexa 12/2010 - nl)   Hydronephrosis, unspecified, unspecified   Hyperlipidemia  (LDL 83 - 10/2013)   Hypertension   OA (osteoarthritis)   Osteoporosis .   Seizure (CMS-HCC)   Stroke (CMS-HCC)   TIA (transient ischemic attack)   PAST SURGICAL HISTORY Past Surgical History:  Procedure Laterality Date   CATARACT EXTRACTION Right  11/02/15   COLONOSCOPY 2002  [place]?   FRACTURE SURGERY .   JOINT REPLACEMENT   Left hip surgery s/p fracture 02/12/2017   Mohs procedure  On face   PARATHYROIDECTOMY 03/2019   REPLACEMENT TOTAL KNEE Right 08/07/2017   Right ureter stent placement 12/2013    Right ureter stent removal 04/2014   Robotic BSO, 08/2013   FAMILY HISTORY Family History  Problem Relation Age of Onset   Stroke Mother   Breast cancer Mother   Leukemia Mother   Stroke Father   Inflammatory bowel disease Daughter   Colon cancer Neg Hx   Colon polyps Neg Hx   SOCIAL HISTORY  Social History   Tobacco Use   Smoking status: Never Smoker   Smokeless tobacco: Never Used  Vaping Use   Vaping Use: Never used  Substance Use Topics   Alcohol use: No  Alcohol/week: 0.0 standard drinks   Drug use: Never   REVIEW OF SYSTEMS:  13 system ROS form was given to the patient to complete and I have reviewed it. The form was sent for scan to the patient's EHR. Pertinent positives and negatives are mentioned above in the HPI and all other systems are negative.  DATA   CT HEAD CODE STROKE WO CONTRAST 04/03/2018 CLINICAL DATA:  Code stroke. Initial evaluation for acute right-sided weakness, speech difficulty.  EXAM: CT HEAD WITHOUT CONTRAST  TECHNIQUE: Contiguous axial images were obtained from the base of the skull through the  vertex without intravenous contrast.  COMPARISON:  Prior CT from 11/04/2017 and MRI from 11/05/2017.  FINDINGS: Brain: Advanced cerebral atrophy with chronic small vessel ischemic disease, stable. Remote lacunar infarcts present within the hemispheric right cerebral white matter and right thalamus. Few scatter remote bilateral cerebellar infarcts. Small remote left PCA territory infarct.  No acute intracranial hemorrhage. No acute large vessel territory infarct. No mass lesion, midline shift or mass effect. No hydrocephalus.  Vascular: No hyperdense vessel. Calcified atherosclerosis at the skull base.  Skull: Scalp soft tissues and calvarium demonstrate no acute finding.  Sinuses/Orbits: Globes and orbital soft tissues within normal limits. Chronic left maxillary sinusitis partially visualized. Paranasal sinuses and mastoid air cells  are otherwise clear.  Other: None.  ASPECTS [facility] Stroke Program Early CT Score)  - Ganglionic level infarction (caudate, lentiform nuclei, internal capsule, insula, M1-M3 cortex): 7  - Supraganglionic infarction (M4-M6 cortex): 3  Total score (0-10 with 10 being normal): 10  IMPRESSION: 1. No acute intracranial infarct or other abnormality. 2. ASPECTS is 10. 3. Atrophy with extensive chronic ischemic changes as above, stable from previous.  CT ANGIO HEAD W OR WO CONTRAST 04/03/2018 CLINICAL DATA:  Initial evaluation for acute stroke. Right-sided weakness.  EXAM: CT ANGIOGRAPHY HEAD AND NECK  CT PERFUSION BRAIN  TECHNIQUE: Multidetector CT imaging of the head and neck was performed using the standard protocol during bolus administration of intravenous contrast. Multiplanar CT image reconstructions and MIPs were obtained to evaluate the vascular anatomy. Carotid stenosis measurements (when applicable) are obtained utilizing NASCET criteria, using the distal internal carotid diameter as the denominator.  Multiphase CT imaging of the brain was performed following IV bolus contrast injection. Subsequent parametric perfusion maps were calculated using RAPID software.  CONTRAST:  140mL ISOVUE-370 IOPAMIDOL (ISOVUE-370) INJECTION 76%, <See Chart> ISOVUE-370 IOPAMIDOL (ISOVUE-370) INJECTION 76%  COMPARISON:  Prior study from 11/04/2017  FINDINGS: CTA NECK FINDINGS  Aortic arch: Visualized aortic arch of normal caliber with normal branch pattern. Scattered atheromatous plaque within the arch and about the visualized great vessels without hemodynamically significant stenosis. Visualized subclavian arteries widely patent.  Right carotid system: Right common carotid artery tortuous proximally but widely patent to the bifurcation. Minimal atheromatous plaque about the right bifurcation without significant stenosis. Right ICA widely patent distally to the skull base  without stenosis, dissection, or occlusion.  Left carotid system: Left common carotid artery tortuous proximally but widely patent to the bifurcation without stenosis. Eccentric calcified plaque about the left bifurcation without hemodynamically significant stenosis. Left ICA patent distally to the skull base without stenosis, dissection, or occlusion.  Vertebral arteries: Both of the vertebral arteries arise from the subclavian arteries. Right vertebral artery dominant. Vertebral arteries tortuous proximally but are widely patent within the neck without stenosis, dissection, or occlusion.  Skeleton: No acute osseous abnormality. No discrete lytic or blastic osseous lesions. Mild to moderate cervical spondylolysis at C3-4 through C6-7.  Other neck: No acute soft tissue abnormality within the neck. Chronic left maxillary sinusitis noted.  Upper chest: Visualized upper chest within normal limits. Partially visualized lungs are grossly clear.  Review of the MIP images confirms the above findings  CTA HEAD FINDINGS  Anterior circulation: Petrous segments widely patent bilaterally. Mild scattered atheromatous plaque throughout the cavernous/supraclinoid ICAs without hemodynamically significant stenosis. ICA termini widely patent. A1 segments widely patent. Normal anterior communicating artery. Anterior cerebral arteries widely patent to their distal aspects.  Left M1 widely patent. No proximal left M2 occlusion distal left MCA branches well perfused.  Right M1 widely patent to its distal aspect. Subtle 3 mm focal outpouching arising from the distal right M1 segment again seen, suspicious for small aneurysm versus infundibulum (series 7, image 105). No proximal right M2 occlusion. Distal right MCA branches well perfused.  Posterior circulation: Atheromatous plaque within the V4 segments bilaterally with secondary mild to moderate stenoses, stable from previous. Right before  dominant. Posterior inferior cerebral arteries patent bilaterally. Basilar artery widely patent to its distal aspect without stenosis. Superior cerebral arteries patent bilaterally. Both of the posterior cerebral arteries primarily supplied via the basilar and are well perfused to their distal aspects.  Venous sinuses: Grossly patent.  Anatomic variants: None significant.  Delayed phase: Not performed.  Review of the MIP images confirms the above findings  CT Brain Perfusion Findings:  CT perfusion failed on this exam as no suitable AIF location was determined. Findings suspected to be related to motion artifact from the patient on this portion of the exam.  IMPRESSION: 1. Negative CTA for large vessel occlusion. 2. Failed and nondiagnostic CT perfusion. 3. atheromatous change involving the major arterial vasculature of the head and neck, stable relative to previous CTA from 11/04/2017. Changes most notable within the bilateral V4 segments were there are mild to moderate stenoses. No other hemodynamically significant or correctable stenosis identified. 4. Stable 3 mm focal outpouching arising from the distal right M1 segment, possibly reflecting a small aneurysm versus vascular infundibulum. 5. Tortuosity of the major arterial vasculature within the neck, suggesting chronic underlying hypertension.  CT ANGIO NECK W OR WO CONTRAST 04/03/2018 CLINICAL DATA:  Initial evaluation for acute stroke. Right-sided weakness.  EXAM: CT ANGIOGRAPHY HEAD AND NECK  CT PERFUSION BRAIN  TECHNIQUE: Multidetector CT imaging of the head and neck was performed using the standard protocol during bolus administration of intravenous contrast. Multiplanar CT image reconstructions and MIPs were obtained to evaluate the vascular anatomy. Carotid stenosis measurements (when applicable) are obtained utilizing NASCET criteria, using the distal internal carotid diameter as  the denominator.  Multiphase CT imaging of the brain was performed following IV bolus contrast injection. Subsequent parametric perfusion maps were calculated using RAPID software.  CONTRAST:  127mL ISOVUE-370 IOPAMIDOL (ISOVUE-370) INJECTION 76%, <See Chart> ISOVUE-370 IOPAMIDOL (ISOVUE-370) INJECTION 76%  COMPARISON:  Prior study from 11/04/2017  FINDINGS: CTA NECK FINDINGS  Aortic arch: Visualized aortic arch of normal caliber with normal branch pattern. Scattered atheromatous plaque within the arch and about the visualized great vessels without hemodynamically significant stenosis. Visualized subclavian arteries widely patent.  Right carotid system: Right common carotid artery tortuous proximally but widely patent to the bifurcation. Minimal atheromatous plaque about the right bifurcation without significant stenosis. Right ICA widely patent distally to the skull base without stenosis, dissection, or occlusion.  Left carotid system: Left common carotid artery tortuous proximally but widely patent to the bifurcation without stenosis. Eccentric calcified plaque about the left bifurcation without hemodynamically significant stenosis. Left ICA patent distally to the skull base without stenosis, dissection, or occlusion.  Vertebral arteries: Both of the vertebral arteries arise from the subclavian arteries. Right vertebral artery dominant. Vertebral arteries tortuous proximally but are widely patent within the neck without stenosis, dissection, or occlusion.  Skeleton: No acute osseous abnormality. No discrete lytic or blastic osseous lesions. Mild to moderate cervical spondylolysis at C3-4 through C6-7.  Other neck: No acute soft tissue abnormality within the neck. Chronic left maxillary sinusitis noted.  Upper chest: Visualized upper chest within normal limits. Partially visualized lungs are grossly clear.  Review  of the MIP images confirms the above findings  CTA  HEAD FINDINGS  Anterior circulation: Petrous segments widely patent bilaterally. Mild scattered atheromatous plaque throughout the cavernous/supraclinoid ICAs without hemodynamically significant stenosis. ICA termini widely patent. A1 segments widely patent. Normal anterior communicating artery. Anterior cerebral arteries widely patent to their distal aspects.  Left M1 widely patent. No proximal left M2 occlusion distal left MCA branches well perfused.  Right M1 widely patent to its distal aspect. Subtle 3 mm focal outpouching arising from the distal right M1 segment again seen, suspicious for small aneurysm versus infundibulum (series 7, image 105). No proximal right M2 occlusion. Distal right MCA branches well perfused.  Posterior circulation: Atheromatous plaque within the V4 segments bilaterally with secondary mild to moderate stenoses, stable from previous. Right before dominant. Posterior inferior cerebral arteries patent bilaterally. Basilar artery widely patent to its distal aspect without stenosis. Superior cerebral arteries patent bilaterally. Both of the posterior cerebral arteries primarily supplied via the basilar and are well perfused to their distal aspects.  Venous sinuses: Grossly patent.  Anatomic variants: None significant.  Delayed phase: Not performed.  Review of the MIP images confirms the above findings  CT Brain Perfusion Findings:  CT perfusion failed on this exam as no suitable AIF location was determined. Findings suspected to be related to motion artifact from the patient on this portion of the exam.  IMPRESSION: 1. Negative CTA for large vessel occlusion. 2. Failed and nondiagnostic CT perfusion. 3. atheromatous change involving the major arterial vasculature of the head and neck, stable relative to previous CTA from 11/04/2017. Changes most notable within the bilateral V4 segments were there are mild to moderate stenoses. No other  hemodynamically significant or correctable stenosis identified. 4. Stable 3 mm focal outpouching arising from the distal right M1 segment, possibly reflecting a small aneurysm versus vascular infundibulum. 5. Tortuosity of the major arterial vasculature within the neck, suggesting chronic underlying hypertension.  EEG [facility]) 11/05/2017 Patient: Mayara Paulson Goerner       Room #: 117A-AA EEG No. ID: [id number] Age: 88 y.o.        Sex: female Referring Physician: Sudini Report Date:  11/05/2017        Interpreting Physician: Alexis Goodell  History: Tahari Clabaugh Ferrentino is an 88 y.o. female with pre-syncope and confusion  Medications:  ASA, Norvasc, Lisinopril, Pravachol, Neurontin  Conditions of Recording:  This is a 16 channel EEG carried out with the  patient in the awake and drowsy states.  Description:  The waking background activity consists of a low voltage,  symmetrical, fairly well organized, 9 Hz alpha activity, seen from the  parieto-occipital and posterior temporal regions.  Low voltage fast  activity, poorly organized, is seen anteriorly and is at times  superimposed on more posterior regions.  A mixture of theta and alpha  rhythms are seen from the central and temporal regions. The patient drowses with slowing to irregular, low voltage theta and beta  activity.   Stage II sleep is not obtained. No epileptiform activity is noted.   Hyperventilation was not performed. Intermittent photic stimulation was  performed but failed to illicit any change in the tracing.    IMPRESSION: Normal electroencephalogram, awake, drowsy and with activation procedures.  There are no focal lateralizing or epileptiform features.  CT CERBERAL PERFUSION W CONTRAST 04/03/2018 CLINICAL DATA:  Initial evaluation for acute stroke. Right-sided weakness.  EXAM: CT ANGIOGRAPHY HEAD AND NECK  CT PERFUSION BRAIN  TECHNIQUE: Multidetector CT imaging  of the head and neck was  performed using the standard protocol during bolus administration of intravenous contrast. Multiplanar CT image reconstructions and MIPs were obtained to evaluate the vascular anatomy. Carotid stenosis measurements (when applicable) are obtained utilizing NASCET criteria, using the distal internal carotid diameter as the denominator.  Multiphase CT imaging of the brain was performed following IV bolus contrast injection. Subsequent parametric perfusion maps were calculated using RAPID software.  CONTRAST:  128mL ISOVUE-370 IOPAMIDOL (ISOVUE-370) INJECTION 76%, <See Chart> ISOVUE-370 IOPAMIDOL (ISOVUE-370) INJECTION 76%  COMPARISON:  Prior study from 11/04/2017  FINDINGS: CTA NECK FINDINGS  Aortic arch: Visualized aortic arch of normal caliber with normal branch pattern. Scattered atheromatous plaque within the arch and about the visualized great vessels without hemodynamically significant stenosis. Visualized subclavian arteries widely patent.  Right carotid system: Right common carotid artery tortuous proximally but widely patent to the bifurcation. Minimal atheromatous plaque about the right bifurcation without significant stenosis. Right ICA widely patent distally to the skull base without stenosis, dissection, or occlusion.  Left carotid system: Left common carotid artery tortuous proximally but widely patent to the bifurcation without stenosis. Eccentric calcified plaque about the left bifurcation without hemodynamically significant stenosis. Left ICA patent distally to the skull base without stenosis, dissection, or occlusion.  Vertebral arteries: Both of the vertebral arteries arise from the subclavian arteries. Right vertebral artery dominant. Vertebral arteries tortuous proximally but are widely patent within the neck without stenosis, dissection, or occlusion.  Skeleton: No acute osseous abnormality. No discrete lytic or blastic osseous lesions. Mild to moderate  cervical spondylolysis at C3-4 through C6-7.  Other neck: No acute soft tissue abnormality within the neck. Chronic left maxillary sinusitis noted.  Upper chest: Visualized upper chest within normal limits. Partially visualized lungs are grossly clear.  Review of the MIP images confirms the above findings  CTA HEAD FINDINGS  Anterior circulation: Petrous segments widely patent bilaterally. Mild scattered atheromatous plaque throughout the cavernous/supraclinoid ICAs without hemodynamically significant stenosis. ICA termini widely patent. A1 segments widely patent. Normal anterior communicating artery. Anterior cerebral arteries widely patent to their distal aspects.  Left M1 widely patent. No proximal left M2 occlusion distal left MCA branches well perfused.  Right M1 widely patent to its distal aspect. Subtle 3 mm focal outpouching arising from the distal right M1 segment again seen, suspicious for small aneurysm versus infundibulum (series 7, image 105). No proximal right M2 occlusion. Distal right MCA branches well perfused.  Posterior circulation: Atheromatous plaque within the V4 segments bilaterally with secondary mild to moderate stenoses, stable from previous. Right before dominant. Posterior inferior cerebral arteries patent bilaterally. Basilar artery widely patent to its distal aspect without stenosis. Superior cerebral arteries patent bilaterally. Both of the posterior cerebral arteries primarily supplied via the basilar and are well perfused to their distal aspects.  Venous sinuses: Grossly patent.  Anatomic variants: None significant.  Delayed phase: Not performed.  Review of the MIP images confirms the above findings  CT Brain Perfusion Findings:  CT perfusion failed on this exam as no suitable AIF location was determined. Findings suspected to be related to motion artifact from the patient on this portion of the exam.  IMPRESSION: 1. Negative CTA  for large vessel occlusion. 2. Failed and nondiagnostic CT perfusion. 3. atheromatous change involving the major arterial vasculature of the head and neck, stable relative to previous CTA from 11/04/2017. Changes most notable within the bilateral V4 segments were there are mild to moderate stenoses. No other hemodynamically significant or correctable stenosis  identified. 4. Stable 3 mm focal outpouching arising from the distal right M1 segment, possibly reflecting a small aneurysm versus vascular infundibulum. 5. Tortuosity of the major arterial vasculature within the neck, suggesting chronic underlying hypertension.  MR BRAIN WO CONTRAST 04/04/2018 CLINICAL DATA:  Follow-up examination for acute stroke. Right-sided weakness, aphasia, status post tPA.  EXAM: MRI HEAD WITHOUT CONTRAST  TECHNIQUE: Multiplanar, multiecho pulse sequences of the brain and surrounding structures were obtained without intravenous contrast.  COMPARISON:  Prior CTA from 04/03/2018.  FINDINGS: Brain: Generalized age-related cerebral atrophy with moderate chronic small vessel ischemic disease. Remote lacunar infarcts seen involving the bilateral thalami and right basal ganglia/hemispheric cerebral white matter. Few small remote bilateral cerebellar infarcts.  No abnormal foci of restricted diffusion to suggest acute or subacute ischemia. Gray-white matter differentiation maintained. No areas of remote cortical infarction. No acute or chronic intracranial hemorrhage.  No mass lesion, midline shift or mass effect. No hydrocephalus. No extra-axial fluid collection. Normal pituitary gland.  Vascular: Major intracranial vascular flow voids maintained.  Skull and upper cervical spine: Craniocervical junction within normal limits. Multilevel cervical spondylolysis noted within the upper cervical spine. Bone marrow signal intensity within normal limits. No scalp soft tissue abnormality.  Sinuses/Orbits:  Patient status post ocular lens replacement bilaterally. Globes and orbital soft tissues demonstrate no acute finding. Chronic left maxillary sinusitis. No mastoid effusion. Inner ear structures normal.  Other: None.  IMPRESSION: 1. No acute intracranial infarct or other abnormality identified. 2. Moderately advanced cerebral atrophy with chronic small vessel ischemic disease with scattered remote infarcts as above. EEG 04/04/2018 HISTORY: The patient is 88 year old presents with acute focal neurological  deficits.  The study is being done to evaluate for seizure as the  etiology.  MEDICATIONS: Scheduled Meds:   stroke: mapping our early stages of recovery book   Does not  apply Once   amLODipine  5 mg Oral Daily   gabapentin  100 mg Oral TID   labetalol  20 mg Intravenous Once   lisinopril  20 mg Oral BID   mouth rinse  15 mL Mouth Rinse BID   pravastatin  20 mg Oral QHS   Continuous Infusions:  sodium chloride 75 mL/hr at 04/04/18 1600   famotidine (PEPCID) IV Stopped (04/04/18 0943)   niCARDipine     PRN Meds:.acetaminophen **OR** acetaminophen (TYLENOL) oral  liquid 160 mg/5 mL **OR** acetaminophen, senna-docusate  Prior to Admission medications   Medication Sig Start Date End Date Taking? Authorizing Provider  acetaminophen (TYLENOL) 500 MG tablet Take 500-1,000 mg by mouth  every 6 (six) hours as needed (for pain or headaches).   Yes  [provider]  amLODipine (NORVASC) 5 MG tablet Take 5 mg by mouth daily.   Yes  [provider]  amoxicillin (AMOXIL) 500 MG capsule Take 2,000 mg by mouth See  admin instructions. Take 2,000 mg by mouth one hour prior to  dental procedures 01/01/18  Yes [provider]  clopidogrel (PLAVIX) 75 MG tablet Take 75 mg by mouth daily.    Yes [provider]  lisinopril (PRINIVIL,ZESTRIL) 20 MG tablet Take 20 mg by mouth 2  (two) times daily.   Yes [provider]  pravastatin (PRAVACHOL)  20 MG tablet Take 1 tablet (20 mg total)  by mouth at bedtime. 08/09/17  Yes Earnestine Leys, MD  Probiotic CAPS Take 1 capsule by mouth daily as needed (when IBS  flares).   Yes [provider]  gabapentin (NEURONTIN) 100 MG capsule Take 1  capsule (100 mg  total) by mouth 3 (three) times daily. Patient not taking: Reported on 04/03/2018 08/09/17   Earnestine Leys, MD   ANALYSIS: A 16 channel recording using standard 10 20 measurements is  conducted for 22 minutes.   The background activity gets as high  as 8 hertz.  There is beta activity observed in the frontal  areas.  Awake and drowsy architecture are documented. The  recording is somewhat degraded by persistent myogenic artifact.   Photic stimulation and hyperventilation are not conducted.  There  is no focal or lateralized slowing.  There is no epileptiform  activity is observed.  IMPRESSION: 1.  This is a normal recording of awake and drowsy states.  ECHOCARDIOGRAM 11/05/2017:   Patient: Kellie Harris, Kellie Harris MR #: [id number] Study Date: 11/05/2017 Gender: F Age: 43 Height: 149.9 cm Weight: 44.7 kg BSA: 1.36 m^2 Pt. Status: Room:  SONOGRAPHER Sonia Side Hege RDCS PERFORMING [facility] ATTENDING Pyreddy, Pavan ORDERING Pyreddy, Pavan REFERRING Pyreddy, Pavan  cc:  ------------------------------------------------------------------- LV EF: 60% - 65%  ------------------------------------------------------------------- Indications: TIA 435.9.  ------------------------------------------------------------------- History: PMH: Cancer, hypercholesterolemia. Risk factors: Hypertension.  ------------------------------------------------------------------- Study Conclusions  - Left ventricle: The cavity size was normal. Systolic function was normal. The estimated ejection fraction was in the range of 60% to 65%. - Aortic valve: There was mild regurgitation. Valve area (Vmax): 2.04 cm^2. - Mitral valve: There was  mild regurgitation. Valve area by pressure half-time: 2.47 cm^2.  ------------------------------------------------------------------- Study data: Study status: Routine. Procedure: The patient reported no pain pre or post test. Transthoracic echocardiography. Image quality was adequate. Transthoracic echocardiography. M-mode, complete 2D, spectral Doppler, and color Doppler. Birthdate: Patient birthdate: 1930/10/24. Age: Patient is 88 yr old. Sex: Gender: female. BMI: 19.9 kg/m^2. Blood pressure: 152/99 Patient status: Inpatient. Study date: Study date: 11/05/2017. Study time: 01:01 PM. Location: Echo laboratory.  -------------------------------------------------------------------  ------------------------------------------------------------------- Left ventricle: The cavity size was normal. Systolic function was normal. The estimated ejection fraction was in the range of 60% to 65%.  ------------------------------------------------------------------- Aortic valve: Mildly thickened leaflets. Cusp separation was normal. Doppler: Transvalvular velocity was within the normal range. There was no stenosis. There was mild regurgitation. Peak velocity ratio of LVOT to aortic valve: 0.65. Valve area (Vmax): 2.04 cm^2. Indexed valve area (Vmax): 1.5 cm^2/m^2. Peak gradient (S): 7 mm Hg.  ------------------------------------------------------------------- Aorta: The aorta was normal, not dilated, and non-diseased.  ------------------------------------------------------------------- Mitral valve: Mildly thickened leaflets . Doppler: There was mild regurgitation. Valve area by pressure half-time: 2.47 cm^2. Indexed valve area by pressure half-time: 1.81 cm^2/m^2.  ------------------------------------------------------------------- Left atrium: The atrium was normal in size.  ------------------------------------------------------------------- Right ventricle: The cavity size was normal.  Wall thickness was normal. Systolic function was normal.  ------------------------------------------------------------------- Tricuspid valve: Structurally normal valve. Leaflet separation was normal. Doppler: Transvalvular velocity was within the normal range. There was no regurgitation.  ------------------------------------------------------------------- Right atrium: The atrium was normal in size.  ------------------------------------------------------------------- Post procedure conclusions Ascending Aorta:  - The aorta was normal, not dilated, and non-diseased.  ------------------------------------------------------------------- Measurements  Left ventricle Value Reference LV ID, ED, PLAX chordal (L) 27.9 mm 43 - 52 LV ID, ES, PLAX chordal (L) 18 mm 23 - 38 LV fx shortening, PLAX chordal 35 % >=29 LV PW thickness, ED 12.3 mm ---------- IVS/LV PW ratio, ED 0.98 <=1.3 LV e&', lateral 11 cm/s ---------- LV E/e&', lateral 5.2 ---------- LV e&', medial 5.44 cm/s ---------- LV E/e&', medial 10.51 ---------- LV e&', average 8.22 cm/s ---------- LV E/e&', average 6.96 ----------  Ventricular  septum Value Reference IVS thickness, ED 12 mm ----------  LVOT Value Reference LVOT ID, S 20 mm ---------- LVOT area 3.14 cm^2 ---------- LVOT peak velocity, S 84.4 cm/s ----------  Aortic valve Value Reference Aortic valve peak velocity, S 130 cm/s ---------- Aortic peak gradient, S 7 mm Hg ---------- Velocity ratio, peak, LVOT/AV 0.65 ---------- Aortic valve area, peak velocity 2.04 cm^2 ---------- Aortic valve area/bsa, peak 1.5 cm^2/m^2 ---------- velocity  Aorta Value Reference Aortic root ID, ED 32 mm ----------  Left atrium Value Reference LA ID, A-P, ES 30 mm ---------- LA ID/bsa, A-P 2.2 cm/m^2 <=2.2 LA volume, ES, 1-p A4C 19.9 ml ---------- LA volume/bsa, ES, 1-p A4C 14.6 ml/m^2 ---------- LA volume, ES, 1-p A2C 16.3 ml ---------- LA volume/bsa, ES, 1-p A2C 11.9  ml/m^2 ----------  Mitral valve Value Reference Mitral E-wave peak velocity 57.2 cm/s ---------- Mitral A-wave peak velocity 110 cm/s ---------- Mitral deceleration time (H) 303 ms 150 - 230 Mitral pressure half-time 89 ms ---------- Mitral E/A ratio, peak 0.5 ---------- Mitral valve area, PHT, DP 2.47 cm^2 ---------- Mitral valve area/bsa, PHT, DP 1.81 cm^2/m^2 ----------  Right atrium Value Reference RA ID, S-I, ES, A4C (L) 22.3 mm 34 - 49 RA area, ES, A4C (L) 3.53 cm^2 8.3 - 19.5 RA volume, ES, A/L 4.4 ml ---------- RA volume/bsa, ES, A/L 3.2 ml/m^2 ----------  Right ventricle Value Reference RV ID, ED, PLAX 26.2 mm 19 - 38  Pulmonic valve Value Reference Pulmonic valve peak velocity, S 78.6 cm/s ----------  Legend: (L) and (H) mark values outside specified reference range.  ------------------------------------------------------------------- Prepared and Electronically Authenticated by  ECHOCARDIOGRAM 04/06/2018  Patient:    Kellie Harris, Kellie Harris MR #:       [id number] Study Date: 04/06/2018 Gender:     F Age:        19 Height:     149.9 cm Weight:     44.7 kg BSA:        1.36 m^2 Pt. Status: Room:       3W27C  ADMITTING    Nancy Marus, Eric REFERRING    Kerney Elbe PERFORMING   [facility], Inpatient ATTENDING    Mesner, Corene Cornea SONOGRAPHER  Mikki Santee  cc:  ------------------------------------------------------------------- LV EF: 60% -   65%  ------------------------------------------------------------------- Indications:      CVA 69.  ------------------------------------------------------------------- History:   Risk factors:  Hypertension.  ------------------------------------------------------------------- Study Conclusions  - Left ventricle: The cavity size was normal. Wall thickness was   increased in a pattern of mild LVH. Systolic function was normal.   The estimated ejection fraction was in the range of 60% to 65%.   Wall  motion was normal; there were no regional wall motion   abnormalities. Doppler parameters are consistent with abnormal   left ventricular relaxation (grade 1 diastolic dysfunction). - Aortic valve: Moderately calcified annulus. Trileaflet;   moderately thickened leaflets. There was mild regurgitation.   Valve area (VTI): 3.46 cm^2. Valve area (Vmax): 2.95 cm^2. Valve   area (Vmean): 2.94 cm^2. - Mitral valve: Mildly calcified annulus. Normal thickness leaflets   . - Technically adequate study.  ------------------------------------------------------------------- Study data:  Comparison was made to the study of 11/05/2017.  Study status:  Routine.  Procedure:  The patient reported no pain pre or post test. Transthoracic echocardiography. Image quality was adequate.  Study completion:  There were no complications. Transthoracic echocardiography.  M-mode, complete 2D, spectral Doppler, and color Doppler.  Birthdate:  Patient birthdate: 1930/11/27.  Age:  Patient is 88 yr old.  Sex:  Gender: female. BMI: 19.9 kg/m^2.  Blood pressure:     136/82  Patient status: Inpatient.  Study date:  Study date: 04/06/2018. Study time: 11:03 AM.  Location:  Echo laboratory.  -------------------------------------------------------------------  ------------------------------------------------------------------- Left ventricle:  The cavity size was normal. Wall thickness was increased in a pattern of mild LVH. Systolic function was normal. The estimated ejection fraction was in the range of 60% to 65%. Wall motion was normal; there were no regional wall motion abnormalities. Doppler parameters are consistent with abnormal left ventricular relaxation (grade 1 diastolic dysfunction). There was no evidence of elevated ventricular filling pressure by Doppler parameters.  ------------------------------------------------------------------- Aortic valve:   Moderately calcified annulus. Trileaflet; moderately  thickened leaflets.  Doppler:   There was no stenosis. There was mild regurgitation.    VTI ratio of LVOT to aortic valve: 1.1. Valve area (VTI): 3.46 cm^2. Indexed valve area (VTI): 2.54 cm^2/m^2. Peak velocity ratio of LVOT to aortic valve: 0.94. Valve area (Vmax): 2.95 cm^2. Indexed valve area (Vmax): 2.16 cm^2/m^2. Mean velocity ratio of LVOT to aortic valve: 0.94. Valve area (Vmean): 2.94 cm^2. Indexed valve area (Vmean): 2.16 cm^2/m^2. Mean gradient (S): 3 mm Hg. Peak gradient (S): 6 mm Hg.  ------------------------------------------------------------------- Aorta:  Aortic root: The aortic root was normal in size.  ------------------------------------------------------------------- Mitral valve:   Mildly calcified annulus. Normal thickness leaflets .  Doppler:   There was no evidence for stenosis.   There was no significant regurgitation.    Peak gradient (D): 2 mm Hg.  ------------------------------------------------------------------- Left atrium:  The atrium was normal in size.  ------------------------------------------------------------------- Atrial septum:  No defect or patent foramen ovale was identified.  ------------------------------------------------------------------- Right ventricle:  The cavity size was normal. Systolic function was normal.  ------------------------------------------------------------------- Pulmonic valve:   Not well visualized.  Doppler:   There was no evidence for stenosis.   There was no significant regurgitation.  ------------------------------------------------------------------- Tricuspid valve:   Normal thickness leaflets.  Doppler:   There was no evidence for stenosis.   There was trivial regurgitation.  ------------------------------------------------------------------- Pulmonary artery:    Systolic pressure could not be accurately estimated.   Inadequate TR  jet.  ------------------------------------------------------------------- Right atrium:  The atrium was normal in size.  ------------------------------------------------------------------- Pericardium:  There was no pericardial effusion.  ------------------------------------------------------------------- Systemic veins: Inferior vena cava: The vessel was normal in size. The respirophasic diameter changes were in the normal range (>= 50%), consistent with normal central venous pressure.  ------------------------------------------------------------------- Measurements  Left ventricle                          Value           Reference LV ID, ED, PLAX chordal         (L)     35     mm       43 - 52 LV ID, ES, PLAX chordal                 23     mm       23 - 38 LV fx shortening, PLAX chordal          34     %        >=29 LV PW thickness, ED                     11     mm       ----------  IVS/LV PW ratio, ED                     1               <=1.3 Stroke volume, 2D                       72     ml       ---------- Stroke volume/bsa, 2D                   53     ml/m^2   ---------- LV e&', lateral                          9.57   cm/s     ---------- LV E/e&', lateral                        7.57            ---------- LV e&', medial                           7.83   cm/s     ---------- LV E/e&', medial                         9.25            ---------- LV e&', average                          8.7    cm/s     ---------- LV E/e&', average                        8.32            ---------- LV ejection time                        260    ms       ----------  Ventricular septum                      Value           Reference IVS thickness, ED                       11     mm       ----------  LVOT                                    Value           Reference LVOT ID, S                              20     mm       ---------- LVOT area                               3.14   cm^2     ---------- LVOT  peak velocity, S  115    cm/s     ---------- LVOT mean velocity, S                   77.5   cm/s     ---------- LVOT VTI, S                             23     cm       ---------- LVOT peak gradient, S                   5      mm Hg    ----------  Aortic valve                            Value           Reference Aortic valve peak velocity, S           122.55 cm/s     ---------- Aortic valve mean velocity, S           82.67  cm/s     ---------- Aortic valve VTI, S                     20.96  cm       ---------- Aortic mean gradient, S                 3      mm Hg    ---------- Aortic peak gradient, S                 6      mm Hg    ---------- VTI ratio, LVOT/AV                      1.1             ---------- Aortic valve area, VTI                  3.46   cm^2     ---------- Aortic valve area/bsa, VTI              2.54   cm^2/m^2 ---------- Velocity ratio, peak, LVOT/AV           0.94            ---------- Aortic valve area, peak                 2.95   cm^2     ---------- velocity Aortic valve area/bsa, peak             2.16   cm^2/m^2 ---------- velocity Velocity ratio, mean, LVOT/AV           0.94            ---------- Aortic valve area, mean                 2.94   cm^2     ---------- velocity Aortic valve area/bsa, mean             2.16   cm^2/m^2 ---------- velocity Aortic regurg pressure                  427    ms       ---------- half-time  Aorta  Value           Reference Aortic root ID, ED                      30     mm       ----------  Left atrium                             Value           Reference LA ID, A-P, ES                          28     mm       ---------- LA ID/bsa, A-P                          2.05   cm/m^2   <=2.2 LA volume, S                            34.8   ml       ---------- LA volume/bsa, S                        25.5   ml/m^2   ---------- LA volume, ES, 1-p A4C                  31.6   ml        ---------- LA volume/bsa, ES, 1-p A4C              23.2   ml/m^2   ---------- LA volume, ES, 1-p A2C                  37.2   ml       ---------- LA volume/bsa, ES, 1-p A2C              27.3   ml/m^2   ----------  Mitral valve                            Value           Reference Mitral E-wave peak velocity             72.4   cm/s     ---------- Mitral A-wave peak velocity             116    cm/s     ---------- Mitral deceleration time                214    ms       150 - 230 Mitral peak gradient, D                 2      mm Hg    ---------- Mitral E/A ratio, peak                  0.6             ----------  Tricuspid valve                         Value           Reference Tricuspid regurg peak velocity  244    cm/s     ---------- Tricuspid peak RV-RA gradient           24     mm Hg    ----------  Right atrium                            Value           Reference RA ID, S-I, ES, A4C             (H)     52.5   mm       34 - 49 RA area, ES, A4C                        14.5   cm^2     8.3 - 19.5 RA volume, ES, A/L                      32.4   ml       ---------- RA volume/bsa, ES, A/L                  23.7   ml/m^2   ----------  Systemic veins                          Value           Reference Estimated CVP                           3      mm Hg    ----------  Right ventricle                         Value           Reference TAPSE                                   16.1   mm       ---------- RV s&', lateral, S                       12.3   cm/s     ----------  Legend: (L)  and  (H)  mark values outside specified reference range.  ------------------------------------------------------------------- Prepared and Electronically Authenticated by  Kerry Hough, M.D. 2019-08-25T12:06:27  Other Result Information  Interface, Three One Seven - 04/06/2018 12:06 PM EDT  *[facility]* [address], [place]  [address] [phone]  ------------------------------------------------------------------- Transthoracic Echocardiography  Patient: Kellie Harris, Kellie Harris MR #: [id number] Study Date: 04/06/2018 Gender: F Age: 89 Height: 149.9 cm Weight: 44.7 kg BSA: 1.36 m^2 Pt. Status: Room: 3W27C  ADMITTING Nancy Marus, Eric REFERRING Kerney Elbe PERFORMING [facility], Inpatient ATTENDING Mesner, Corene Cornea SONOGRAPHER Mikki Santee  cc:  ------------------------------------------------------------------- LV EF: 60% - 65%  ------------------------------------------------------------------- Indications: CVA 40.  ------------------------------------------------------------------- History: Risk factors: Hypertension.  ------------------------------------------------------------------- Study Conclusions  - Left ventricle: The cavity size was normal. Wall thickness was increased in a pattern of mild LVH. Systolic function was normal. The estimated ejection fraction was in the range of 60% to 65%. Wall motion was normal; there were no regional wall motion abnormalities. Doppler parameters are consistent with abnormal left ventricular relaxation (  grade 1 diastolic dysfunction). - Aortic valve: Moderately calcified annulus. Trileaflet; moderately thickened leaflets. There was mild regurgitation. Valve area (VTI): 3.46 cm^2. Valve area (Vmax): 2.95 cm^2. Valve area (Vmean): 2.94 cm^2. - Mitral valve: Mildly calcified annulus. Normal thickness leaflets . - Technically adequate study.  ------------------------------------------------------------------- Study data: Comparison was made to the study of 11/05/2017. Study status: Routine. Procedure: The patient reported no pain pre or post test. Transthoracic echocardiography. Image quality was adequate. Study completion: There were no complications. Transthoracic echocardiography. M-mode, complete 2D, spectral Doppler, and color  Doppler. Birthdate: Patient birthdate: 07-07-1931. Age: Patient is 88 yr old. Sex: Gender: female. BMI: 19.9 kg/m^2. Blood pressure: 136/82 Patient status: Inpatient. Study date: Study date: 04/06/2018. Study time: 11:03 AM. Location: Echo laboratory.  -------------------------------------------------------------------  ------------------------------------------------------------------- Left ventricle: The cavity size was normal. Wall thickness was increased in a pattern of mild LVH. Systolic function was normal. The estimated ejection fraction was in the range of 60% to 65%. Wall motion was normal; there were no regional wall motion abnormalities. Doppler parameters are consistent with abnormal left ventricular relaxation (grade 1 diastolic dysfunction). There was no evidence of elevated ventricular filling pressure by Doppler parameters.  ------------------------------------------------------------------- Aortic valve: Moderately calcified annulus. Trileaflet; moderately thickened leaflets. Doppler: There was no stenosis. There was mild regurgitation. VTI ratio of LVOT to aortic valve: 1.1. Valve area (VTI): 3.46 cm^2. Indexed valve area (VTI): 2.54 cm^2/m^2. Peak velocity ratio of LVOT to aortic valve: 0.94. Valve area (Vmax): 2.95 cm^2. Indexed valve area (Vmax): 2.16 cm^2/m^2. Mean velocity ratio of LVOT to aortic valve: 0.94. Valve area (Vmean): 2.94 cm^2. Indexed valve area (Vmean): 2.16 cm^2/m^2. Mean gradient (S): 3 mm Hg. Peak gradient (S): 6 mm Hg.  ------------------------------------------------------------------- Aorta: Aortic root: The aortic root was normal in size.  ------------------------------------------------------------------- Mitral valve: Mildly calcified annulus. Normal thickness leaflets . Doppler: There was no evidence for stenosis. There was no significant regurgitation. Peak gradient (D): 2 mm  Hg.  ------------------------------------------------------------------- Left atrium: The atrium was normal in size.  ------------------------------------------------------------------- Atrial septum: No defect or patent foramen ovale was identified.  ------------------------------------------------------------------- Right ventricle: The cavity size was normal. Systolic function was normal.  ------------------------------------------------------------------- Pulmonic valve: Not well visualized. Doppler: There was no evidence for stenosis. There was no significant regurgitation.  ------------------------------------------------------------------- Tricuspid valve: Normal thickness leaflets. Doppler: There was no evidence for stenosis. There was trivial regurgitation.  ------------------------------------------------------------------- Pulmonary artery: Systolic pressure could not be accurately estimated. Inadequate TR jet.  ------------------------------------------------------------------- Right atrium: The atrium was normal in size.  ------------------------------------------------------------------- Pericardium: There was no pericardial effusion.  ------------------------------------------------------------------- Systemic veins: Inferior vena cava: The vessel was normal in size. The respirophasic diameter changes were in the normal range (>= 50%), consistent with normal central venous pressure.  ------------------------------------------------------------------- Measurements  Left ventricle Value Reference LV ID, ED, PLAX chordal (L) 35 mm 43 - 52 LV ID, ES, PLAX chordal 23 mm 23 - 38 LV fx shortening, PLAX chordal 34 % >=29 LV PW thickness, ED 11 mm ---------- IVS/LV PW ratio, ED 1 <=1.3 Stroke volume, 2D 72 ml ---------- Stroke volume/bsa, 2D 53 ml/m^2 ---------- LV e&', lateral 9.57 cm/s ---------- LV E/e&', lateral 7.57 ---------- LV e&', medial 7.83 cm/s  ---------- LV E/e&', medial 9.25 ---------- LV e&', average 8.7 cm/s ---------- LV E/e&', average 8.32 ---------- LV ejection time 260 ms ----------  Ventricular septum Value Reference IVS thickness, ED 11 mm ----------  LVOT Value Reference LVOT ID, S 20 mm ---------- LVOT area 3.14 cm^2 ---------- LVOT peak velocity,  S 115 cm/s ---------- LVOT mean velocity, S 77.5 cm/s ---------- LVOT VTI, S 23 cm ---------- LVOT peak gradient, S 5 mm Hg ----------  Aortic valve Value Reference Aortic valve peak velocity, S 122.55 cm/s ---------- Aortic valve mean velocity, S 82.67 cm/s ---------- Aortic valve VTI, S 20.96 cm ---------- Aortic mean gradient, S 3 mm Hg ---------- Aortic peak gradient, S 6 mm Hg ---------- VTI ratio, LVOT/AV 1.1 ---------- Aortic valve area, VTI 3.46 cm^2 ---------- Aortic valve area/bsa, VTI 2.54 cm^2/m^2 ---------- Velocity ratio, peak, LVOT/AV 0.94 ---------- Aortic valve area, peak 2.95 cm^2 ---------- velocity Aortic valve area/bsa, peak 2.16 cm^2/m^2 ---------- velocity Velocity ratio, mean, LVOT/AV 0.94 ---------- Aortic valve area, mean 2.94 cm^2 ---------- velocity Aortic valve area/bsa, mean 2.16 cm^2/m^2 ---------- velocity Aortic regurg pressure 427 ms ---------- half-time  Aorta Value Reference Aortic root ID, ED 30 mm ----------  Left atrium Value Reference LA ID, A-P, ES 28 mm ---------- LA ID/bsa, A-P 2.05 cm/m^2 <=2.2 LA volume, S 34.8 ml ---------- LA volume/bsa, S 25.5 ml/m^2 ---------- LA volume, ES, 1-p A4C 31.6 ml ---------- LA volume/bsa, ES, 1-p A4C 23.2 ml/m^2 ---------- LA volume, ES, 1-p A2C 37.2 ml ---------- LA volume/bsa, ES, 1-p A2C 27.3 ml/m^2 ----------  Mitral valve Value Reference Mitral E-wave peak velocity 72.4 cm/s ---------- Mitral A-wave peak velocity 116 cm/s ---------- Mitral deceleration time 214 ms 150 - 230 Mitral peak gradient, D 2 mm Hg ---------- Mitral E/A ratio, peak 0.6  ----------  Tricuspid valve Value Reference Tricuspid regurg peak velocity 244 cm/s ---------- Tricuspid peak RV-RA gradient 24 mm Hg ----------  Right atrium Value Reference RA ID, S-I, ES, A4C (H) 52.5 mm 34 - 49 RA area, ES, A4C 14.5 cm^2 8.3 - 19.5 RA volume, ES, A/L 32.4 ml ---------- RA volume/bsa, ES, A/L 23.7 ml/m^2 ----------  Systemic veins Value Reference Estimated CVP 3 mm Hg ----------  Right ventricle Value Reference TAPSE 16.1 mm ---------- RV s&', lateral, S 12.3 cm/s ----------  Legend: (L) and (H) mark values outside specified reference range.  ------------------------------------------------------------------- Prepared and Electronically Authenticated by  Kerry Hough, M.D. 2019-08-25T12:06:27   US CAROTID BILATERAL 11/05/2017 CLINICAL DATA:  CVA, hypertension, syncope, visual disturbance  EXAM: BILATERAL CAROTID DUPLEX ULTRASOUND  TECHNIQUE: Pearline Cables scale imaging, color Doppler and duplex ultrasound were performed of bilateral carotid and vertebral arteries in the neck.  COMPARISON:  11/07/2016  FINDINGS: Criteria: Quantification of carotid stenosis is based on velocity parameters that correlate the residual internal carotid diameter with NASCET-based stenosis levels, using the diameter of the distal internal carotid lumen as the denominator for stenosis measurement.  The following velocity measurements were obtained:  RIGHT  ICA:  57/16 cm/sec  CCA:  09/32 cm/sec  SYSTOLIC ICA/CCA RATIO:  0.7  DIASTOLIC ICA/CCA RATIO:  0.9  ECA:  76 cm/sec  LEFT  ICA:  78/19 cm/sec  CCA:  35/57 cm/sec  SYSTOLIC ICA/CCA RATIO:  1.1  DIASTOLIC ICA/CCA RATIO:  1.1  ECA:  71 cm/sec  RIGHT CAROTID ARTERY: Minor echogenic shadowing plaque formation. No hemodynamically significant right ICA stenosis, velocity elevation, or turbulent flow. Degree of narrowing less than 50%.  RIGHT VERTEBRAL ARTERY:  Antegrade  LEFT CAROTID ARTERY: Similar  scattered minor echogenic plaque formation. No hemodynamically significant left ICA stenosis, velocity elevation, or turbulent flow.  LEFT VERTEBRAL ARTERY:  Antegrade  IMPRESSION: Minor carotid atherosclerosis. No hemodynamically significant ICA stenosis. Degree of narrowing less than 50% bilaterally by ultrasound criteria.  Patent antegrade vertebral flow bilaterally.  Electronically Signed  By: Eugenie Filler M.D.   On: 11/05/2017 10:16  Other Result Information  Interface, Rad Results In - 11/05/2017 10:18 AM EDT CLINICAL DATA: CVA, hypertension, syncope, visual disturbance  EXAM: BILATERAL CAROTID DUPLEX ULTRASOUND  TECHNIQUE: Pearline Cables scale imaging, color Doppler and duplex ultrasound were performed of bilateral carotid and vertebral arteries in the neck.  COMPARISON: 11/07/2016  FINDINGS: Criteria: Quantification of carotid stenosis is based on velocity parameters that correlate the residual internal carotid diameter with NASCET-based stenosis levels, using the diameter of the distal internal carotid lumen as the denominator for stenosis measurement.  The following velocity measurements were obtained:  RIGHT  ICA: 57/16 cm/sec  CCA: 93/81 cm/sec  SYSTOLIC ICA/CCA RATIO: 0.7  DIASTOLIC ICA/CCA RATIO: 0.9  ECA: 76 cm/sec  LEFT  ICA: 78/19 cm/sec  CCA: 82/99 cm/sec  SYSTOLIC ICA/CCA RATIO: 1.1  DIASTOLIC ICA/CCA RATIO: 1.1  ECA: 71 cm/sec  RIGHT CAROTID ARTERY: Minor echogenic shadowing plaque formation. No hemodynamically significant right ICA stenosis, velocity elevation, or turbulent flow. Degree of narrowing less than 50%.  RIGHT VERTEBRAL ARTERY: Antegrade  LEFT CAROTID ARTERY: Similar scattered minor echogenic plaque formation. No hemodynamically significant left ICA stenosis, velocity elevation, or turbulent flow.  LEFT VERTEBRAL ARTERY: Antegrade  IMPRESSION: Minor carotid atherosclerosis. No hemodynamically significant ICA stenosis.  Degree of narrowing less than 50% bilaterally by ultrasound criteria.  Patent antegrade vertebral flow bilaterally.   __________________________________________________  11/04/2017 CT HEAD CODE STROKE IMPRESSION:  1. No acute finding by CT. Atrophy and extensive chronic ischemic changes seen throughout as above. 2. ASPECTS is 10. 3. These results were communicated to [facility] at [facility] 11/04/2017 via telephone. __________________________________________________  11/04/2017 CT ANGIO HEAD W OR WO CONTRAST IMPRESSION:  1. Negative CTA for emergent large vessel occlusion. No evidence for acute stroke by CT perfusion. 2. Mild atheromatous change involving the major arterial vasculature of the head and neck as above, most notable within the bilateral V4 segments were there are mild to moderate stenoses. No other hemodynamically significant or correctable stenosis identified. 3. 3 mm focal outpouching arising from the right M1 segment, suspicious for aneurysm. __________________________________________________  11/04/2017 CT ANGIO NECK W OR WO CONTRAST IMPRESSION:  IMPRESSION: 1. Negative CTA for emergent large vessel occlusion. No evidence for acute stroke by CT perfusion. 2. Mild atheromatous change involving the major arterial vasculature of the head and neck as above, most notable within the bilateral V4 segments were there are mild to moderate stenoses. No other hemodynamically significant or correctable stenosis identified. 3. 3 mm focal outpouching arising from the right M1 segment, suspicious for aneurysm. __________________________________________________  11/05/2017 MR MRA HEAD/BRAIN WO CM IMPRESSION:  Brain MRI:  1. No acute finding.  No acute infarct. 2. Moderate chronic small vessel ischemic injury as described. 3. Chronic left maxillary sinusitis.  Intracranial MRA:  Stable from CTA yesterday. No emergent large vessel occlusion or proximal flow limiting  stenosis. 11/05/2017 US CAROTID BILATERAL IMPRESSION:  IMPRESSION: Minor carotid atherosclerosis. No hemodynamically significant ICA stenosis. Degree of narrowing less than 50% bilaterally by ultrasound criteria.  Patent antegrade vertebral flow bilaterally. 11/05/2017 ECHOCARDIOGRAM IMPRESSION:  - Left ventricle: The cavity size was normal. Systolic function was normal. The estimated ejection fraction was in the range of 60% to 65%. - Aortic valve: There was mild regurgitation. Valve area (Vmax): 2.04 cm^2. - Mitral valve: There was mild regurgitation. Valve area by pressure half-time: 2.47 cm^2. 11/05/2017 EEG IMPRESSION:  Normal electroencephalogram, awake, drowsy and with activation procedures.  There are no focal lateralizing  or epileptiform features.  ___________________________________________________________ 07/09/2014 US CAROTID BILATERAL CLINICAL DATA:  Sudden onset of right hand weakness last night   EXAM:  BILATERAL CAROTID DUPLEX ULTRASOUND   TECHNIQUE:  Pearline Cables scale imaging, color Doppler and duplex ultrasound were  performed of bilateral carotid and vertebral arteries in the neck.   COMPARISON:  04/30/2011   FINDINGS:  Criteria: Quantification of carotid stenosis is based on velocity  parameters that correlate the residual internal carotid diameter  with NASCET-based stenosis levels, using the diameter of the distal  internal carotid lumen as the denominator for stenosis measurement.   The following velocity measurements were obtained:   RIGHT   ICA:  70 cm/sec   CCA:  87 cm/sec   SYSTOLIC ICA/CCA RATIO:  0.8   DIASTOLIC ICA/CCA RATIO:   ECA:  76 cm/sec   LEFT   ICA:  68 cm/sec   CCA:  69 cm/sec   SYSTOLIC ICA/CCA RATIO:  1.0   DIASTOLIC ICA/CCA RATIO:   ECA:  76 cm/sec   RIGHT CAROTID ARTERY: Minimal calcified plaque in the bulb. Low  resistance internal carotid Doppler pattern.   RIGHT VERTEBRAL ARTERY:  Antegrade with a normal  Doppler pattern.   LEFT CAROTID ARTERY: Minimal intimal thickening. Mild calcified  plaque in the lower internal carotid. Low resistance internal  carotid Doppler pattern.   LEFT VERTEBRAL ARTERY:  Antegrade with a normal Doppler pattern.   IMPRESSION:  Less than 50% stenosis in the right and left internal carotid  arteries.  ___________________________________________________________ 02/11/2017 MRI BRAIN WO CONTRAST IMPRESSION:  CLINICAL DATA:  LEFT hip pain. Altered mental status. History of hypertension.  EXAM: MRI HEAD WITHOUT CONTRAST  TECHNIQUE: Multiplanar, multiecho pulse sequences of the brain and surrounding structures were obtained without intravenous contrast.  COMPARISON:  CT HEAD February 11, 2017 at 2044 hours and MRI of the head July 09, 2014  FINDINGS: BRAIN: No reduced diffusion to suggest acute ischemia. No susceptibility artifact to suggest hemorrhage. The ventricles and sulci are normal for patient's age. Old RIGHT thalamus lacunar infarct. Patchy to confluent supratentorial white matter FLAIR T2 hyperintensities within cystic small infarct RIGHT frontal lobe white matter. Old RIGHT basal ganglia lacunar infarct. Multiple old small bilateral cerebellar infarcts. No midline shift, mass effect or masses. No abnormal extra-axial fluid collections.  VASCULAR: Normal major intracranial vascular flow voids present at skull base.  SKULL AND UPPER CERVICAL SPINE: No abnormal sellar expansion. No suspicious calvarial bone marrow signal. Craniocervical junction maintained.  SINUSES/ORBITS: Chronic LEFT maxillary sinusitis with atresia. Mastoid air cells are well aerated. The included ocular globes and orbital contents are non-suspicious. Status post bilateral ocular lens implants.  OTHER: None.  IMPRESSION: 1. No acute intracranial process. 2. Old RIGHT basal ganglia, RIGHT thalamus and bilateral cerebella lacunar infarcts. 3. Moderate chronic small  vessel ischemic disease. ___________________________________________________________ 02/11/2017 CT HEAD WO CONTRAST IMPRESSION:  CLINICAL DATA:  Altered mental status. Difficulty word finding after fall.  EXAM: CT HEAD WITHOUT CONTRAST  TECHNIQUE: Contiguous axial images were obtained from the base of the skull through the vertex without intravenous contrast.  COMPARISON:  07/08/2014 CT  FINDINGS: BRAIN: There is sulcal and ventricular prominence consistent with superficial and central atrophy. No intraparenchymal hemorrhage, mass effect nor midline shift. Periventricular and subcortical white matter hypodensities consistent with chronic small vessel ischemic disease are identified. Chronic right external capsule and thalamic lacunar infarcts are again noted. No acute large vascular territory infarcts. No abnormal extra-axial fluid collections. Basal cisterns are not effaced and midline.  VASCULAR: Moderate calcific atherosclerosis of the carotid siphons and both vertebral arteries.  SKULL: No skull fracture. No significant scalp soft tissue swelling.  SINUSES/ORBITS: The mastoid air-cells are clear. The included paranasal sinuses are well-aerated.The included ocular globes and orbital contents are non-suspicious. Bilateral lens replacement surgeries are noted.  OTHER: None.  IMPRESSION: Chronic cerebral atrophy with small vessel ischemic disease of periventricular and subcortical white matter. No acute intracranial appearing abnormality by CT. ___________________________________________________________  Appointment on 12/16/2020  Component Date Value Ref Range Status   WBC (White Blood Cell Count) 12/16/2020 10.8 (!) 4.1 - 10.2 103/uL Final   RBC (Red Blood Cell Count) 12/16/2020 4.50 4.04 - 5.48 106/uL Final   Hemoglobin 12/16/2020 13.7 12.0 - 15.0 gm/dL Final   Hematocrit 12/16/2020 41.9 35.0 - 47.0 % Final   MCV (Mean Corpuscular Volume) 12/16/2020 93.1 80.0 -  100.0 fl Final   MCH (Mean Corpuscular Hemoglobin) 12/16/2020 30.4 27.0 - 31.2 pg Final   MCHC (Mean Corpuscular Hemoglobin * 12/16/2020 32.7 32.0 - 36.0 gm/dL Final   Platelet Count 12/16/2020 251 150 - 450 103/uL Final   RDW-CV (Red Cell Distribution Widt* 12/16/2020 14.0 11.6 - 14.8 % Final   MPV (Mean Platelet Volume) 12/16/2020 9.1 (!) 9.4 - 12.4 fl Final   Neutrophils 12/16/2020 9.04 (!) 1.50 - 7.80 103/uL Final   Lymphocytes 12/16/2020 0.86 (!) 1.00 - 3.60 103/uL Final   Monocytes 12/16/2020 0.63 0.00 - 1.50 103/uL Final   Eosinophils 12/16/2020 0.20 0.00 - 0.55 103/uL Final   Basophils 12/16/2020 0.08 0.00 - 0.09 103/uL Final   Neutrophil % 12/16/2020 83.6 (!) 32.0 - 70.0 % Final   Lymphocyte % 12/16/2020 7.9 (!) 10.0 - 50.0 % Final   Monocyte % 12/16/2020 5.8 4.0 - 13.0 % Final   Eosinophil % 12/16/2020 1.8 1.0 - 5.0 % Final   Basophil% 12/16/2020 0.7 0.0 - 2.0 % Final   Immature Granulocyte % 12/16/2020 0.2 <=0.7 % Final   Immature Granulocyte Count 12/16/2020 0.02 <=0.06 10^3/L Final   Cholesterol, Total 12/16/2020 177 100 - 200 mg/dL Final   Triglyceride 12/16/2020 227 (!) 35 - 199 mg/dL Final   HDL (High Density Lipoprotein) Cho* 12/16/2020 59.4 35.0 - 85.0 mg/dL Final   LDL Calculated 12/16/2020 72 0 - 130 mg/dL Final   VLDL Cholesterol 12/16/2020 45 mg/dL Final   Cholesterol/HDL Ratio 12/16/2020 3.0 Final   No follow-ups on file.  Payor: [employer] / Plan: [employer] / Product Type: PPO /   This note is partially prepared by Coralyn Pear, Scribe, in the presence of and acting as the scribe for Luella Cook PA-C  I agree that the scribe documentation is complete and accurate.  I personally performed the service, non-incident to. [facility])   [place], [place]      Electronically signed by Kellie Harris, [place] at 06/07/2021 4:32 PM EDT  Plan of Treatment - documented as of this encounter Plan of Treatment - Upcoming  Encounters Upcoming Encounters Date Type Specialty Care Team Description  06/19/2021 Office Visit Internal Medicine Dion Body, MD   [place]   [facility] [place], [place] [address]   [phone] (Work)   [phone] (Fax)      07/05/2021 Ancillary Orders Lab Dion Body, MD   [place]   [facility] [place], [place] [address]   [phone] (Work)   [phone] (Fax)      07/12/2021 Office Visit Internal Medicine Dion Body, MD  [place]   [facility] [place], [place] [address]   [phone] (Work)   [phone] (Fax)      08/16/2021 Ancillary Orders Lab Toni Arthurs, [place], [place] [address]   [phone] (Work)   [phone] (Fax)      08/23/2021 Office Visit Endocrinology Toni Arthurs, MD   [address]   [place], [place] [address]   [phone] (Work)   [phone] (Fax)       Goals - documented as of this encounter Goals Goal Patient Goal Type Associated Problems Recent Progress Patient-Stated? Author  Feel better   General   On track (01/03/2021 1:58 PM EDT) Yes Jori Moll, RN  Patient Goals   General   Not on track (01/03/2021 1:59 PM EDT) Yes Bernarda Caffey, CMA  Note:    Formatting of this note might be different from the original. Patient states she would like her mobility to improve so she can walk without pain.    Maintain health/healthy lifestyle   Lifestyle   On track (01/03/2021 1:58 PM EDT) No Stegall, Amy, CMA   Visit Diagnoses - documented in this encounter Visit Diagnoses Diagnosis  Lightheadedness - Primary   Dizziness and giddiness    History of stroke   Transient ischemic attack (TIA), and cerebral infarction without residual deficits    Bilateral shoulder pain, unspecified chronicity    Weakness   Other malaise and fatigue     Care Teams - documented as of this encounter Care Teams Team  Member Relationship Specialty Start Date End Date  Dion Body, MD   [place]   [facility] [place], [place] [address]   [phone] (Work)   [phone] (Fax)   PCP - General Family Medicine 02/22/14     Images Patient Demographics  Patient Demographics Patient Address Communication Language Race / Ethnicity Marital Status  [place] [facility]) [place], [place] [address]  Former (Jul. 07, 2015 - Apr. 09, 2017): [address] [place]) [employer], [place] [address] [phone] (Home) [phone] (Mobile) [email or web address] English (Preferred) White / Not Hispanic or Latino Widowed   Patient Contacts  Patient Contacts Contact Name Contact Address Communication Relationship to Patient  [place] Ger[place] Unknown [phone] [facility]) [phone] (Home) Son or Daughter, Emergency Contact  [place] Unknown [phone] (Mobile) Son or Daughter, Emergency Contact   Document Information  Primary Care Provider Other Service Providers Document Coverage Dates  Dion Body, MD (Jul. 13, 2015July 13, 2015 - Present) [phone] (Work) [phone] (Fax) [place] [facility] [place], [place] [address] Family Medicine [facility] [address] [place], [place] [address]  Oct. 26, 2022October 26, 2022 - Oct. 30, 2022October 30, 2022   [place] [address] [place], [place] [address]   Encounter Providers Encounter Date  Kellie Harris, [place] (Attending) [phone] (Work) [phone] (Fax) [place],  [address] Neurology Oct. 26, 2022October 26, 2022 - Oct. 30, 2022October 30, 2022   Legal Authenticator   Associate Chief Health Information Officer     Show All Sections

## 2021-08-24 NOTE — Pre-Procedure Instructions (Signed)
Progress Notes - documented in this encounter Dion Body, MD - 06/19/2021 12:30 PM EST Formatting of this note is different from the original. Chief Complaint  Patient presents with   Hospital Follow Up   HPI  Kellie Harris is a 88 y.o. here for hospital f/u  Hospital f/u: Patient admitted to [facility] on 06/12/2021 - 06/13/2021 secondary to acute aphasia that lasted approximately 4 hours per patient. Spontaneously resolved. MRI of the brain was negative for acute CVA. Patient was discharged on aspirin therapy which she was already on. Maintain pravastatin. Restarted all blood pressure medications. Neurology evaluated patient and recommended outpatient EEG. Patient is typically followed by Dr. Melrose Nakayama. Home health physical therapy was extended. She has no residual deficits at this time.  ROS Review of systems is unremarkable for any active cardiac, respiratory, GI, GU, hematologic, neurologic, dermatologic, HEENT, or psychiatric symptoms except as noted above. No fevers, chills, or constitutional symptoms.   Current Outpatient Medications: acetaminophen (TYLENOL) 500 MG tablet, Take 1,000 mg by mouth every 8 (eight) hours as needed, Taking amLODIPine (NORVASC) 5 MG tablet, Take 1 tablet (5 mg total) by mouth once daily., Taking aspirin 81 MG EC tablet, Take 81 mg by mouth once daily, Taking cholecalciferol (VITAMIN D3) 1000 unit tablet, Take by mouth once daily , Taking lisinopriL (ZESTRIL) 20 MG tablet, Take 1 tablet (20 mg total) by mouth 2 (two) times daily., Taking meclizine (ANTIVERT) 25 mg tablet, Take 1 tablet (25 mg total) by mouth 3 (three) times daily as needed, Taking metoprolol succinate (TOPROL-XL) 25 MG XL tablet, Take 0.5 tablets (12.5 mg total) by mouth once daily, Taking multivitamin tablet, Take 1 tablet by mouth once daily, Taking pravastatin (PRAVACHOL) 40 MG tablet, Take 1 tablet (40 mg total) by mouth at bedtime, Taking  Allergies as of 06/19/2021   (No Known  Allergies)   Patient Active Problem List  Diagnosis   Personal history of other malignant neoplasm of skin   History of osteopenia   Essential hypertension   Mixed hyperlipidemia (Trig 227, LDL 72 - 12/16/20)   Stricture or kinking of ureter   Medicare annual wellness visit, initial 04/24/12   Medicare annual wellness visit, subsequent 06/24/20   History of TIA (10/2017) on ASA - followed by [facility] Neurology   Lightheadedness   Shoulder pain   Hip pain, bilateral   Age-related osteoporosis without current pathological fracture   Hyperparathyroidism (CMS-HCC)   Senile osteoporosis   Chronic hyponatremia (Na 136 - 06/17/20)   Pelvic mass   Ascending aorta dilatation (CT 01/2021; 4cm)   Past Medical History:  Diagnosis Date   Diverticulitis   Diverticulosis   H/O fracture of femur  Hx of fx of right femur condyle   Hemorrhoid   Hemorrhoids   History of cataract .   History of osteopenia  (last dexa 12/2010 - nl)   Hydronephrosis, unspecified, unspecified   Hyperlipidemia  (LDL 83 - 10/2013)   Hypertension   OA (osteoarthritis)   Osteoporosis .   Seizure (CMS-HCC)   Stroke (CMS-HCC)   TIA (transient ischemic attack)   Past Surgical History:  Procedure Laterality Date   CATARACT EXTRACTION Right  11/02/15   COLONOSCOPY 2002  [place]?   FRACTURE SURGERY .   JOINT REPLACEMENT   Left hip surgery s/p fracture 02/12/2017   Mohs procedure  On face   PARATHYROIDECTOMY 03/2019   REPLACEMENT TOTAL KNEE Right 08/07/2017   Right ureter stent placement 12/2013   Right ureter stent removal 04/2014  Robotic BSO, 08/2013   Vitals:  06/19/21 1235  BP: (!) 142/80  Weight: (!) 43.9 kg (96 lb 12.8 oz)  Height: 144.8 cm (4\' 9" )  PainSc: 4  PainLoc: Shoulder   Body mass index is 20.95 kg/m.  Exam  General. Well appearing; NAD; VS reviewed  Eyes. Sclera and conjunctiva clear; Vision grossly intact; extraocular movements intact Neck. Supple.  Lungs. Respirations unlabored; clear to  auscultation bilaterally Cardiovascular. Heart regular rate and rhythm without murmurs, gallops, or rubs Skin. Normal color and turgor Neurologic. Alert and oriented x3; CN 2-12 grossly intact; no new focal deficits  Assessment and Plan  1. History of aphasia (06/12/21) Acute. Resolved. Unclear etiology at this point. MRI was negative. Further evaluation with neurology needed. We will send message to Dr. Lannie Fields staff.  2. Essential hypertension Stable. Continue with current regimen.  Medications and allergies reviewed and reconciled. Hospital notes, labs, and studies reviewed. Transitional phone call complete within 48 hours of discharge.  F/u with Dr. Melrose Nakayama and staff  Dion Body, MD Electronically signed by Dion Body, MD at 06/19/2021 8:49 PM EST  Plan of Treatment - documented as of this encounter Plan of Treatment - Upcoming Encounters Upcoming Encounters Date Type Specialty Care Team Description  01/03/2022 Ancillary Orders Lab Dion Body, MD   [place]   [facility] [place], [place] [address]   [phone] (Work)   [phone] (Fax)      01/04/2022 Office Visit Endocrinology Toni Arthurs, [place]   [place], [place] [address]   [phone] (Work)   [phone] (Fax)      01/10/2022 Office Visit Internal Medicine Dion Body, MD   [place]   [facility] [place], [place] [address]   [phone] (Work)   [phone] (Fax)       Goals - documented as of this encounter Goals Goal Patient Goal Type Associated Problems Recent Progress Patient-Stated? Chief Strategy Officer  Feel better   General   Not on track (07/12/2021 1:22 PM EST) Yes Jori Moll, RN  Patient Goals   General   Not on track (07/12/2021 1:22 PM EST) Yes Bernarda Caffey, CMA  Note:    Formatting of this note might be different from the original. Patient states she would like her mobility to improve so she can  walk without pain.    Maintain health/healthy lifestyle   Lifestyle   On track (07/12/2021 1:22 PM EST) No Stegall, Amy, CMA   Visit Diagnoses - documented in this encounter Visit Diagnoses Diagnosis  History of aphasia (06/12/21) - Primary    Essential hypertension     Discontinued Medications - documented as of this encounter Discontinued Medications Medication Sig Discontinue Reason Start Date End Date  lisinopriL (ZESTRIL) 20 MG tablet   Take 1 tablet (20 mg total) by mouth 2 (two) times daily Duplicate order 46/56/8127 06/19/2021   Care Teams - documented as of this encounter Care Teams Team Member Relationship Specialty Start Date End Date  Dion Body, MD   [place]   [facility] [place], [place] [address]   [phone] (Work)   [phone] (Fax)   PCP - General Family Medicine 02/22/14     Images Patient Demographics  Patient Demographics Patient Address Communication Language Race / Ethnicity Marital Status  [address] [facility]) [place], [place] [address]  (Apr. 10, 2017 - ): [facility]) [place], [place] [address] [phone] (Home) [phone] (Mobile) [email or web address]  [email or web address] English (Preferred) White / Not Hispanic or Latino Widowed   Patient Contacts  Patient Contacts Contact Name Contact Address Communication Relationship to Patient  Emonnie Cannady Unknown [phone] [facility]) [phone] (Home) Son or Daughter, Emergency Contact  [place] Unknown [phone] (Mobile) Son or Daughter, Emergency Contact   Document Information  Primary Care Provider Other Service Providers Document Coverage Dates  Dion Body, MD (Jul. 13, 2015July 13, 2015 - Present) [phone] (Work) [phone] (Fax) [place] [facility] [place], [place] [address] Family Medicine [facility] [place], [place] [address]  Nov. 07, 2022November 07, 2022 - Nov. 18, 2022November 18, 2022    [place] [address] [place],  [address]   Encounter Providers Encounter Date  Dion Body, MD (Attending) [phone] (Work) [phone] (Fax) [place] [facility] [place],  [address] Family Medicine Nov. 07, 2022November 07, 2022 - Nov. 18, 2022November 18, 2022   Legal Authenticator   Associate Chief Health Information Officer     Show All Sections

## 2021-08-31 ENCOUNTER — Other Ambulatory Visit: Payer: Self-pay

## 2021-08-31 ENCOUNTER — Other Ambulatory Visit
Admission: RE | Admit: 2021-08-31 | Discharge: 2021-08-31 | Disposition: A | Discharge status: Home / Self Care | Payer: PPO | Source: Ambulatory Visit | Attending: Orthopedic Surgery | Admitting: Orthopedic Surgery

## 2021-08-31 DIAGNOSIS — Z01812 Encounter for preprocedural laboratory examination: Secondary | ICD-10-CM | POA: Insufficient documentation

## 2021-08-31 DIAGNOSIS — Z20822 Contact with and (suspected) exposure to covid-19: Secondary | ICD-10-CM | POA: Insufficient documentation

## 2021-08-31 DIAGNOSIS — M1612 Unilateral primary osteoarthritis, left hip: Secondary | ICD-10-CM | POA: Diagnosis not present

## 2021-09-01 DIAGNOSIS — Z01818 Encounter for other preprocedural examination: Secondary | ICD-10-CM | POA: Diagnosis not present

## 2021-09-01 LAB — TYPE AND SCREEN
ABO/RH(D): O POS
Antibody Screen: NEGATIVE

## 2021-09-01 LAB — SARS CORONAVIRUS 2 (TAT 6-24 HRS): SARS Coronavirus 2: NEGATIVE

## 2021-09-04 ENCOUNTER — Other Ambulatory Visit: Payer: Self-pay

## 2021-09-04 ENCOUNTER — Encounter
Admission: RE | Disposition: A | Discharge status: Skilled Nursing Facility | Payer: Self-pay | Source: Home / Self Care | Attending: Orthopedic Surgery

## 2021-09-04 ENCOUNTER — Encounter: Payer: Self-pay | Admitting: Orthopedic Surgery

## 2021-09-04 ENCOUNTER — Inpatient Hospital Stay: Payer: PPO | Admitting: Anesthesiology

## 2021-09-04 ENCOUNTER — Inpatient Hospital Stay: Payer: PPO

## 2021-09-04 ENCOUNTER — Inpatient Hospital Stay
Admission: RE | Admit: 2021-09-04 | Discharge: 2021-09-06 | DRG: 470 | Disposition: A | Discharge status: Skilled Nursing Facility | Payer: PPO | Attending: Orthopedic Surgery | Admitting: Orthopedic Surgery

## 2021-09-04 DIAGNOSIS — M1612 Unilateral primary osteoarthritis, left hip: Principal | ICD-10-CM | POA: Diagnosis present

## 2021-09-04 DIAGNOSIS — Y831 Surgical operation with implant of artificial internal device as the cause of abnormal reaction of the patient, or of later complication, without mention of misadventure at the time of the procedure: Secondary | ICD-10-CM | POA: Diagnosis present

## 2021-09-04 DIAGNOSIS — Z471 Aftercare following joint replacement surgery: Secondary | ICD-10-CM | POA: Diagnosis not present

## 2021-09-04 DIAGNOSIS — M1711 Unilateral primary osteoarthritis, right knee: Secondary | ICD-10-CM | POA: Diagnosis present

## 2021-09-04 DIAGNOSIS — K8689 Other specified diseases of pancreas: Secondary | ICD-10-CM | POA: Diagnosis present

## 2021-09-04 DIAGNOSIS — T8484XA Pain due to internal orthopedic prosthetic devices, implants and grafts, initial encounter: Secondary | ICD-10-CM | POA: Diagnosis not present

## 2021-09-04 DIAGNOSIS — K589 Irritable bowel syndrome without diarrhea: Secondary | ICD-10-CM | POA: Diagnosis not present

## 2021-09-04 DIAGNOSIS — Z974 Presence of external hearing-aid: Secondary | ICD-10-CM

## 2021-09-04 DIAGNOSIS — M8580 Other specified disorders of bone density and structure, unspecified site: Secondary | ICD-10-CM | POA: Diagnosis not present

## 2021-09-04 DIAGNOSIS — E785 Hyperlipidemia, unspecified: Secondary | ICD-10-CM | POA: Diagnosis not present

## 2021-09-04 DIAGNOSIS — R569 Unspecified convulsions: Secondary | ICD-10-CM | POA: Diagnosis not present

## 2021-09-04 DIAGNOSIS — Z96642 Presence of left artificial hip joint: Secondary | ICD-10-CM

## 2021-09-04 DIAGNOSIS — Z7401 Bed confinement status: Secondary | ICD-10-CM | POA: Diagnosis not present

## 2021-09-04 DIAGNOSIS — I2510 Atherosclerotic heart disease of native coronary artery without angina pectoris: Secondary | ICD-10-CM | POA: Diagnosis not present

## 2021-09-04 DIAGNOSIS — Z961 Presence of intraocular lens: Secondary | ICD-10-CM | POA: Diagnosis not present

## 2021-09-04 DIAGNOSIS — Z9842 Cataract extraction status, left eye: Secondary | ICD-10-CM

## 2021-09-04 DIAGNOSIS — R531 Weakness: Secondary | ICD-10-CM | POA: Diagnosis not present

## 2021-09-04 DIAGNOSIS — M879 Osteonecrosis, unspecified: Secondary | ICD-10-CM | POA: Diagnosis not present

## 2021-09-04 DIAGNOSIS — M19012 Primary osteoarthritis, left shoulder: Secondary | ICD-10-CM | POA: Diagnosis present

## 2021-09-04 DIAGNOSIS — Z96612 Presence of left artificial shoulder joint: Secondary | ICD-10-CM | POA: Diagnosis not present

## 2021-09-04 DIAGNOSIS — Z96651 Presence of right artificial knee joint: Secondary | ICD-10-CM | POA: Diagnosis not present

## 2021-09-04 DIAGNOSIS — T148XXS Other injury of unspecified body region, sequela: Secondary | ICD-10-CM | POA: Diagnosis not present

## 2021-09-04 DIAGNOSIS — Z4789 Encounter for other orthopedic aftercare: Secondary | ICD-10-CM | POA: Diagnosis not present

## 2021-09-04 DIAGNOSIS — Z20822 Contact with and (suspected) exposure to covid-19: Secondary | ICD-10-CM | POA: Diagnosis not present

## 2021-09-04 DIAGNOSIS — M25552 Pain in left hip: Secondary | ICD-10-CM | POA: Diagnosis present

## 2021-09-04 DIAGNOSIS — I639 Cerebral infarction, unspecified: Secondary | ICD-10-CM | POA: Diagnosis not present

## 2021-09-04 DIAGNOSIS — Z8673 Personal history of transient ischemic attack (TIA), and cerebral infarction without residual deficits: Secondary | ICD-10-CM

## 2021-09-04 DIAGNOSIS — K588 Other irritable bowel syndrome: Secondary | ICD-10-CM | POA: Diagnosis not present

## 2021-09-04 DIAGNOSIS — R52 Pain, unspecified: Secondary | ICD-10-CM | POA: Diagnosis not present

## 2021-09-04 DIAGNOSIS — E7800 Pure hypercholesterolemia, unspecified: Secondary | ICD-10-CM | POA: Diagnosis present

## 2021-09-04 DIAGNOSIS — D72829 Elevated white blood cell count, unspecified: Secondary | ICD-10-CM | POA: Diagnosis not present

## 2021-09-04 DIAGNOSIS — I10 Essential (primary) hypertension: Secondary | ICD-10-CM | POA: Diagnosis present

## 2021-09-04 DIAGNOSIS — Z9841 Cataract extraction status, right eye: Secondary | ICD-10-CM | POA: Diagnosis not present

## 2021-09-04 DIAGNOSIS — E569 Vitamin deficiency, unspecified: Secondary | ICD-10-CM | POA: Diagnosis not present

## 2021-09-04 DIAGNOSIS — R5381 Other malaise: Secondary | ICD-10-CM | POA: Diagnosis not present

## 2021-09-04 HISTORY — PX: TOTAL HIP ARTHROPLASTY: SHX124

## 2021-09-04 HISTORY — PX: HARDWARE REMOVAL: SHX979

## 2021-09-04 SURGERY — ARTHROPLASTY, HIP, TOTAL, ANTERIOR APPROACH
Anesthesia: Spinal | Site: Hip | Laterality: Left

## 2021-09-04 MED ORDER — ONDANSETRON HCL 4 MG/2ML IJ SOLN: 4.0000 mg | Freq: Four times a day (QID) | INTRAMUSCULAR | Status: DC | PRN

## 2021-09-04 MED ORDER — HYDROCODONE-ACETAMINOPHEN 5-325 MG PO TABS
1.0000 | ORAL_TABLET | ORAL | Status: DC | PRN
  Administered 2021-09-04 – 2021-09-06 (×2): 1 via ORAL

## 2021-09-04 MED ORDER — AMLODIPINE BESYLATE 5 MG PO TABS
5.0000 mg | ORAL_TABLET | Freq: Every day | ORAL | Status: DC
  Administered 2021-09-05 – 2021-09-06 (×2): 5 mg via ORAL
  Filled 2021-09-04 (×2): qty 1

## 2021-09-04 MED ORDER — CHLORHEXIDINE GLUCONATE 0.12 % MT SOLN
15.0000 mL | Freq: Once | OROMUCOSAL | Status: AC
  Administered 2021-09-04: 15 mL via OROMUCOSAL

## 2021-09-04 MED ORDER — HYDROCODONE-ACETAMINOPHEN 7.5-325 MG PO TABS
1.0000 | ORAL_TABLET | ORAL | Status: DC | PRN
  Filled 2021-09-04: qty 2

## 2021-09-04 MED ORDER — FENTANYL CITRATE (PF) 100 MCG/2ML IJ SOLN
INTRAMUSCULAR | Status: AC
  Filled 2021-09-04: qty 2

## 2021-09-04 MED ORDER — DOCUSATE SODIUM 100 MG PO CAPS
ORAL_CAPSULE | ORAL | Status: AC
  Filled 2021-09-04: qty 1

## 2021-09-04 MED ORDER — KETOROLAC TROMETHAMINE 15 MG/ML IJ SOLN
7.5000 mg | Freq: Four times a day (QID) | INTRAMUSCULAR | Status: AC
  Administered 2021-09-04 – 2021-09-05 (×2): 7.5 mg via INTRAVENOUS

## 2021-09-04 MED ORDER — PROPOFOL 10 MG/ML IV BOLUS
INTRAVENOUS | Status: DC | PRN
  Administered 2021-09-04 (×2): 50 mg via INTRAVENOUS

## 2021-09-04 MED ORDER — PROPOFOL 10 MG/ML IV BOLUS
INTRAVENOUS | Status: AC
  Filled 2021-09-04: qty 20

## 2021-09-04 MED ORDER — CEFAZOLIN SODIUM-DEXTROSE 2-4 GM/100ML-% IV SOLN
2.0000 g | Freq: Four times a day (QID) | INTRAVENOUS | Status: AC
  Filled 2021-09-04 (×2): qty 100

## 2021-09-04 MED ORDER — FENTANYL CITRATE (PF) 100 MCG/2ML IJ SOLN
25.0000 ug | INTRAMUSCULAR | Status: AC | PRN
  Administered 2021-09-04 (×5): 25 ug via INTRAVENOUS

## 2021-09-04 MED ORDER — SODIUM CHLORIDE 0.9 % IR SOLN
Status: DC | PRN
  Administered 2021-09-04: 250 mL

## 2021-09-04 MED ORDER — MORPHINE SULFATE (PF) 4 MG/ML IV SOLN: 0.5000 mg | INTRAVENOUS | Status: DC | PRN

## 2021-09-04 MED ORDER — ACETAMINOPHEN 10 MG/ML IV SOLN: 1000.0000 mg | Freq: Once | INTRAVENOUS | Status: DC | PRN

## 2021-09-04 MED ORDER — HYDROCODONE-ACETAMINOPHEN 5-325 MG PO TABS
ORAL_TABLET | ORAL | Status: AC
  Filled 2021-09-04: qty 1

## 2021-09-04 MED ORDER — ALUM & MAG HYDROXIDE-SIMETH 200-200-20 MG/5ML PO SUSP: 30.0000 mL | ORAL | Status: DC | PRN

## 2021-09-04 MED ORDER — TRANEXAMIC ACID-NACL 1000-0.7 MG/100ML-% IV SOLN
1000.0000 mg | INTRAVENOUS | Status: AC
  Administered 2021-09-04: 1000 mg via INTRAVENOUS

## 2021-09-04 MED ORDER — LIDOCAINE HCL (CARDIAC) PF 100 MG/5ML IV SOSY
PREFILLED_SYRINGE | INTRAVENOUS | Status: DC | PRN
  Administered 2021-09-04: 60 mg via INTRAVENOUS

## 2021-09-04 MED ORDER — PROPOFOL 10 MG/ML IV BOLUS
INTRAVENOUS | Status: AC
  Filled 2021-09-04: qty 40

## 2021-09-04 MED ORDER — PHENYLEPHRINE HCL-NACL 20-0.9 MG/250ML-% IV SOLN
INTRAVENOUS | Status: DC | PRN
  Administered 2021-09-04: 35 ug/min via INTRAVENOUS

## 2021-09-04 MED ORDER — BUPIVACAINE-EPINEPHRINE (PF) 0.25% -1:200000 IJ SOLN
INTRAMUSCULAR | Status: AC
  Filled 2021-09-04: qty 30

## 2021-09-04 MED ORDER — PROPOFOL 500 MG/50ML IV EMUL
INTRAVENOUS | Status: DC | PRN
  Administered 2021-09-04: 90 ug/kg/min via INTRAVENOUS

## 2021-09-04 MED ORDER — ACETAMINOPHEN 325 MG PO TABS: 650.0000 mg | ORAL_TABLET | Freq: Once | ORAL | Status: DC

## 2021-09-04 MED ORDER — ONDANSETRON HCL 4 MG/2ML IJ SOLN
INTRAMUSCULAR | Status: DC | PRN
  Administered 2021-09-04: 4 mg via INTRAVENOUS

## 2021-09-04 MED ORDER — CEFAZOLIN SODIUM-DEXTROSE 2-4 GM/100ML-% IV SOLN
INTRAVENOUS | Status: AC
  Administered 2021-09-04: 2 g via INTRAVENOUS
  Filled 2021-09-04: qty 100

## 2021-09-04 MED ORDER — BISACODYL 10 MG RE SUPP
10.0000 mg | Freq: Every day | RECTAL | Status: DC | PRN
  Filled 2021-09-04: qty 1

## 2021-09-04 MED ORDER — CEFAZOLIN SODIUM-DEXTROSE 2-4 GM/100ML-% IV SOLN
INTRAVENOUS | Status: AC
  Filled 2021-09-04: qty 100

## 2021-09-04 MED ORDER — TRANEXAMIC ACID-NACL 1000-0.7 MG/100ML-% IV SOLN
INTRAVENOUS | Status: AC
  Filled 2021-09-04: qty 100

## 2021-09-04 MED ORDER — ONDANSETRON HCL 4 MG PO TABS
4.0000 mg | ORAL_TABLET | Freq: Four times a day (QID) | ORAL | Status: DC | PRN
  Filled 2021-09-04: qty 1

## 2021-09-04 MED ORDER — MENTHOL 3 MG MT LOZG
1.0000 | LOZENGE | OROMUCOSAL | Status: DC | PRN
  Filled 2021-09-04: qty 9

## 2021-09-04 MED ORDER — PHENOL 1.4 % MT LIQD
1.0000 | OROMUCOSAL | Status: DC | PRN
  Filled 2021-09-04: qty 177

## 2021-09-04 MED ORDER — OXYCODONE HCL 5 MG/5ML PO SOLN: 5.0000 mg | Freq: Once | ORAL | Status: DC | PRN

## 2021-09-04 MED ORDER — ONDANSETRON HCL 4 MG/2ML IJ SOLN
INTRAMUSCULAR | Status: AC
  Filled 2021-09-04: qty 2

## 2021-09-04 MED ORDER — BUPIVACAINE HCL (PF) 0.5 % IJ SOLN
INTRAMUSCULAR | Status: DC | PRN
  Administered 2021-09-04: 2.3 mL via INTRATHECAL

## 2021-09-04 MED ORDER — FENTANYL CITRATE (PF) 100 MCG/2ML IJ SOLN
INTRAMUSCULAR | Status: AC
  Administered 2021-09-04: 25 ug via INTRAVENOUS
  Filled 2021-09-04: qty 2

## 2021-09-04 MED ORDER — 0.9 % SODIUM CHLORIDE (POUR BTL) OPTIME
TOPICAL | Status: DC | PRN
  Administered 2021-09-04: 500 mL

## 2021-09-04 MED ORDER — FENTANYL CITRATE (PF) 100 MCG/2ML IJ SOLN
INTRAMUSCULAR | Status: DC | PRN
Start: 2021-09-04 — End: 2021-09-04
  Administered 2021-09-04 (×2): 25 ug via INTRAVENOUS
  Administered 2021-09-04: 50 ug via INTRAVENOUS

## 2021-09-04 MED ORDER — KETOROLAC TROMETHAMINE 15 MG/ML IJ SOLN
INTRAMUSCULAR | Status: AC
  Administered 2021-09-04: 7.5 mg via INTRAVENOUS
  Filled 2021-09-04: qty 1

## 2021-09-04 MED ORDER — ASPIRIN 81 MG PO CHEW
CHEWABLE_TABLET | ORAL | Status: AC
  Filled 2021-09-04: qty 1

## 2021-09-04 MED ORDER — PRAVASTATIN SODIUM 40 MG PO TABS
40.0000 mg | ORAL_TABLET | Freq: Every day | ORAL | Status: DC
  Administered 2021-09-04 – 2021-09-05 (×2): 40 mg via ORAL
  Filled 2021-09-04 (×3): qty 1

## 2021-09-04 MED ORDER — LISINOPRIL 20 MG PO TABS
20.0000 mg | ORAL_TABLET | Freq: Two times a day (BID) | ORAL | Status: DC
  Administered 2021-09-04 – 2021-09-06 (×4): 20 mg via ORAL
  Filled 2021-09-04 (×5): qty 1

## 2021-09-04 MED ORDER — METOPROLOL SUCCINATE ER 25 MG PO TB24
12.5000 mg | ORAL_TABLET | Freq: Every day | ORAL | Status: DC
  Administered 2021-09-05 – 2021-09-06 (×2): 12.5 mg via ORAL
  Filled 2021-09-04 (×2): qty 0.5

## 2021-09-04 MED ORDER — BUPIVACAINE-EPINEPHRINE (PF) 0.25% -1:200000 IJ SOLN
INTRAMUSCULAR | Status: DC | PRN
  Administered 2021-09-04: 20 mL

## 2021-09-04 MED ORDER — MAGNESIUM HYDROXIDE 400 MG/5ML PO SUSP: 30.0000 mL | Freq: Every day | ORAL | Status: DC | PRN

## 2021-09-04 MED ORDER — FAMOTIDINE 20 MG PO TABS
ORAL_TABLET | ORAL | Status: AC
  Filled 2021-09-04: qty 1

## 2021-09-04 MED ORDER — TRANEXAMIC ACID 1000 MG/10ML IV SOLN
INTRAVENOUS | Status: AC
  Filled 2021-09-04: qty 10

## 2021-09-04 MED ORDER — CEFAZOLIN SODIUM-DEXTROSE 2-4 GM/100ML-% IV SOLN
2.0000 g | INTRAVENOUS | Status: AC
  Administered 2021-09-04: 2 g via INTRAVENOUS

## 2021-09-04 MED ORDER — OXYCODONE HCL 5 MG PO TABS: 5.0000 mg | ORAL_TABLET | Freq: Once | ORAL | Status: DC | PRN

## 2021-09-04 MED ORDER — POVIDONE-IODINE 10 % EX SWAB: 2.0000 "application " | Freq: Once | CUTANEOUS | Status: DC

## 2021-09-04 MED ORDER — ACETAMINOPHEN 325 MG PO TABS: 325.0000 mg | ORAL_TABLET | Freq: Four times a day (QID) | ORAL | Status: DC | PRN

## 2021-09-04 MED ORDER — ASPIRIN 81 MG PO CHEW
81.0000 mg | CHEWABLE_TABLET | Freq: Two times a day (BID) | ORAL | Status: DC
  Administered 2021-09-04: 81 mg via ORAL

## 2021-09-04 MED ORDER — DOCUSATE SODIUM 100 MG PO CAPS
100.0000 mg | ORAL_CAPSULE | Freq: Two times a day (BID) | ORAL | Status: DC
  Administered 2021-09-04 – 2021-09-06 (×2): 100 mg via ORAL
  Filled 2021-09-04: qty 1

## 2021-09-04 MED ORDER — LIDOCAINE HCL (PF) 2 % IJ SOLN
INTRAMUSCULAR | Status: AC
  Filled 2021-09-04: qty 5

## 2021-09-04 MED ORDER — LACTATED RINGERS IV SOLN: INTRAVENOUS | Status: DC

## 2021-09-04 MED ORDER — ORAL CARE MOUTH RINSE: 15.0000 mL | Freq: Once | OROMUCOSAL | Status: AC

## 2021-09-04 MED ORDER — KETOROLAC TROMETHAMINE 15 MG/ML IJ SOLN
INTRAMUSCULAR | Status: AC
  Filled 2021-09-04: qty 1

## 2021-09-04 MED ORDER — METOCLOPRAMIDE HCL 5 MG/ML IJ SOLN: 5.0000 mg | Freq: Three times a day (TID) | INTRAMUSCULAR | Status: DC | PRN

## 2021-09-04 MED ORDER — CHLORHEXIDINE GLUCONATE 0.12 % MT SOLN
OROMUCOSAL | Status: AC
  Filled 2021-09-04: qty 15

## 2021-09-04 MED ORDER — FAMOTIDINE 20 MG PO TABS
20.0000 mg | ORAL_TABLET | Freq: Once | ORAL | Status: AC
  Administered 2021-09-04: 20 mg via ORAL

## 2021-09-04 MED ORDER — METOCLOPRAMIDE HCL 5 MG PO TABS
5.0000 mg | ORAL_TABLET | Freq: Three times a day (TID) | ORAL | Status: DC | PRN
  Filled 2021-09-04: qty 2

## 2021-09-04 MED ORDER — PROMETHAZINE HCL 25 MG/ML IJ SOLN: 6.2500 mg | INTRAMUSCULAR | Status: DC | PRN

## 2021-09-04 SURGICAL SUPPLY — 76 items
APL PRP STRL LF DISP 70% ISPRP (MISCELLANEOUS) ×2
BLADE SAGITTAL WIDE XTHICK NO (BLADE) ×2 IMPLANT
BNDG COHESIVE 4X5 TAN ST LF (GAUZE/BANDAGES/DRESSINGS) ×4 IMPLANT
BNDG ELASTIC 4X5.8 VLCR STR LF (GAUZE/BANDAGES/DRESSINGS) ×2 IMPLANT
BRUSH SCRUB EZ  4% CHG (MISCELLANEOUS) ×1
BRUSH SCRUB EZ 4% CHG (MISCELLANEOUS) ×1 IMPLANT
CHLORAPREP W/TINT 26 (MISCELLANEOUS) ×3 IMPLANT
COVER HOLE (Hips) ×1 IMPLANT
DRAPE 3/4 80X56 (DRAPES) ×2 IMPLANT
DRAPE C-ARM 42X72 X-RAY (DRAPES) ×2 IMPLANT
DRAPE INCISE IOBAN 66X45 STRL (DRAPES) ×2 IMPLANT
DRAPE POUCH INSTRU U-SHP 10X18 (DRAPES) ×1 IMPLANT
DRAPE STERI IOBAN 125X83 (DRAPES) IMPLANT
DRAPE U-SHAPE 47X51 STRL (DRAPES) ×2 IMPLANT
DRSG AQUACEL AG ADV 3.5X10 (GAUZE/BANDAGES/DRESSINGS) ×1 IMPLANT
DRSG AQUACEL AG ADV 3.5X14 (GAUZE/BANDAGES/DRESSINGS) ×1 IMPLANT
ELECT CAUTERY BLADE 6.4 (BLADE) ×2 IMPLANT
ELECT REM PT RETURN 9FT ADLT (ELECTROSURGICAL) ×2
ELECTRODE REM PT RTRN 9FT ADLT (ELECTROSURGICAL) ×1 IMPLANT
GAUZE 4X4 16PLY ~~LOC~~+RFID DBL (SPONGE) ×2 IMPLANT
GAUZE SPONGE 4X4 12PLY STRL (GAUZE/BANDAGES/DRESSINGS) ×2 IMPLANT
GAUZE XEROFORM 1X8 LF (GAUZE/BANDAGES/DRESSINGS) ×3 IMPLANT
GLOVE SURG ORTHO LTX SZ8 (GLOVE) ×4 IMPLANT
GLOVE SURG SYN 8.0 (GLOVE) ×4 IMPLANT
GLOVE SURG SYN 8.0 PF PI (GLOVE) ×2 IMPLANT
GLOVE SURG UNDER LTX SZ8 (GLOVE) ×3 IMPLANT
GLOVE SURG UNDER POLY LF SZ8.5 (GLOVE) ×2 IMPLANT
GOWN STRL REUS W/ TWL LRG LVL3 (GOWN DISPOSABLE) ×1 IMPLANT
GOWN STRL REUS W/ TWL XL LVL3 (GOWN DISPOSABLE) ×2 IMPLANT
GOWN STRL REUS W/TWL LRG LVL3 (GOWN DISPOSABLE) ×2
GOWN STRL REUS W/TWL XL LVL3 (GOWN DISPOSABLE) ×4
HEAD OXINIUM PLUS 0 32MM (Hips) ×1 IMPLANT
HOLSTER ELECTROSUGICAL PENCIL (MISCELLANEOUS) ×2 IMPLANT
IV NS 250ML (IV SOLUTION)
IV NS 250ML BAXH (IV SOLUTION) IMPLANT
IV NS IRRIG 3000ML ARTHROMATIC (IV SOLUTION) ×2 IMPLANT
KIT PATIENT CARE HANA TABLE (KITS) ×2 IMPLANT
KIT TURNOVER CYSTO (KITS) ×2 IMPLANT
KIT TURNOVER KIT A (KITS) ×2 IMPLANT
LINER 3H HEMI SHELL 48MM (Liner) ×1 IMPLANT
LINER ACETABULAR 32X48 (Liner) ×1 IMPLANT
MANIFOLD NEPTUNE II (INSTRUMENTS) ×2 IMPLANT
MAT ABSORB  FLUID 56X50 GRAY (MISCELLANEOUS) ×1
MAT ABSORB FLUID 56X50 GRAY (MISCELLANEOUS) ×1 IMPLANT
NDL FILTER BLUNT 18X1 1/2 (NEEDLE) ×1 IMPLANT
NDL SAFETY ECLIPSE 18X1.5 (NEEDLE) IMPLANT
NDL SPNL 20GX3.5 QUINCKE YW (NEEDLE) ×1 IMPLANT
NEEDLE FILTER BLUNT 18X 1/2SAF (NEEDLE) ×1
NEEDLE FILTER BLUNT 18X1 1/2 (NEEDLE) ×1 IMPLANT
NEEDLE HYPO 18GX1.5 SHARP (NEEDLE)
NEEDLE HYPO 22GX1.5 SAFETY (NEEDLE) ×2 IMPLANT
NEEDLE SPNL 20GX3.5 QUINCKE YW (NEEDLE) ×2 IMPLANT
NS IRRIG 500ML POUR BTL (IV SOLUTION) ×1 IMPLANT
PACK EXTREMITY ARMC (MISCELLANEOUS) ×2 IMPLANT
PACK HIP PROSTHESIS (MISCELLANEOUS) ×2 IMPLANT
PADDING CAST BLEND 4X4 NS (MISCELLANEOUS) ×4 IMPLANT
PILLOW ABDUCTION MEDIUM (MISCELLANEOUS) ×2 IMPLANT
PULSAVAC PLUS IRRIG FAN TIP (DISPOSABLE) ×2
SCREW 6.5X25MM (Screw) ×1 IMPLANT
SOLUTION PRONTOSAN WOUND 350ML (IRRIGATION / IRRIGATOR) ×1 IMPLANT
SPONGE T-LAP 18X18 ~~LOC~~+RFID (SPONGE) ×7 IMPLANT
STAPLER SKIN PROX 35W (STAPLE) ×2 IMPLANT
STEM POLAR STD S6 12/14 COLLAR (Stem) ×1 IMPLANT
SUT DVC 2 QUILL PDO  T11 36X36 (SUTURE) ×1
SUT DVC 2 QUILL PDO T11 36X36 (SUTURE) ×1 IMPLANT
SUT PROLENE 4 0 PS 2 18 (SUTURE) ×2 IMPLANT
SUT VIC AB 1 CT1 18XCR BRD 8 (SUTURE) IMPLANT
SUT VIC AB 1 CT1 8-18 (SUTURE) ×2
SUT VIC AB 2-0 CT1 18 (SUTURE) ×2 IMPLANT
SUT VIC AB 2-0 SH 27 (SUTURE) ×4
SUT VIC AB 2-0 SH 27XBRD (SUTURE) ×2 IMPLANT
SYR 10ML LL (SYRINGE) ×2 IMPLANT
SYR 20ML LL LF (SYRINGE) ×2 IMPLANT
TIP FAN IRRIG PULSAVAC PLUS (DISPOSABLE) ×1 IMPLANT
WAND WEREWOLF FASTSEAL 6.0 (MISCELLANEOUS) ×2 IMPLANT
WATER STERILE IRR 1000ML POUR (IV SOLUTION) ×1 IMPLANT

## 2021-09-04 NOTE — Anesthesia Procedure Notes (Signed)
Spinal  Patient location during procedure: OR Start time: 09/04/2021 10:11 AM End time: 09/04/2021 10:19 AM Reason for block: surgical anesthesia Staffing Performed: resident/CRNA  Resident/CRNA: Loletha Grayer, CRNA Preanesthetic Checklist Completed: patient identified, IV checked, site marked, risks and benefits discussed, surgical consent, monitors and equipment checked, pre-op evaluation and timeout performed Spinal Block Patient position: sitting Prep: Betadine Patient monitoring: heart rate, continuous pulse ox, blood pressure and cardiac monitor Approach: midline Location: L3-4 Injection technique: single-shot Needle Needle type: Introducer and Pencan  Needle gauge: 24 G Needle length: 9 cm Assessment Events: CSF return Additional Notes L3-4 midline x 1 attempt without success. L3-4 paramedian-R x1 with success. Negative paresthesia. Negative blood return. Positive free-flowing CSF. Expiration date of kit checked and confirmed. Patient tolerated procedure well, without complications.

## 2021-09-04 NOTE — H&P (Signed)
The patient has been re-examined, and the chart reviewed, and there have been no interval changes to the documented history and physical.  Plan a left hip removal of intramedullary nail and conversion to total hip replacement today.  Anesthesia is not consulted regarding a peripheral nerve block for post-operative pain.  The risks, benefits, and alternatives have been discussed at length, and the patient is willing to proceed.

## 2021-09-04 NOTE — Evaluation (Signed)
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 PMH includes DDD, diverticulosis, htn, HDL, osteopenia, pancreatic insufficiency, h/o previous CVA's (without residual deficits), R knee fx, s/p ORIF radius and ulnar fx 2019, seizures, R TKR, L hip fx, L IMN intertrochanteric 2018, previous episodes of vomiting, and aphasia.   Clinical Impression  Pt was pleasant and motivated to participate during the session and put forth good effort throughout. Pt required extra time and effort with functional tasks and heavy cuing for WB compliance during amb from bed to chair.  Once in sitting the pt c/o dizziness with BP taken at 115/75, HR 70 bpm, and SpO2 90%, nursing notified.  Pt is at a high risk for WB non-compliance and would not be safe to return to her prior living situation at this time.  Pt will benefit from PT services in a SNF setting upon discharge to safely address deficits listed in patient problem list for decreased caregiver assistance and eventual return to PLOF.         Recommendations for follow up therapy are one component of a multi-disciplinary discharge planning process, led by the attending physician.  Recommendations may be updated based on patient status, additional functional criteria and insurance authorization.  Follow Up Recommendations Skilled nursing-short term rehab (<3 hours/day)    Assistance Recommended at Discharge Frequent or constant Supervision/Assistance  Patient can return home with the following  A lot of help with walking and/or transfers;A lot of help with bathing/dressing/bathroom;Assistance with cooking/housework;Direct supervision/assist for medications management;Help with stairs or ramp for entrance;Assist for transportation     Equipment Recommendations Other (comment) (TBD at next venue of care)  Recommendations for Other Services       Functional Status Assessment Patient has had a recent decline in their functional status and demonstrates the ability to make significant improvements in function in a reasonable and predictable amount of time.     Precautions / Restrictions Precautions Precautions: Fall;Anterior Hip Precaution Booklet Issued: Yes (comment) Restrictions Weight Bearing Restrictions: Yes LLE Weight Bearing: Partial weight bearing LLE Partial Weight Bearing Percentage or Pounds: 50%      Mobility  Bed Mobility Overal bed mobility: Modified Independent             General bed mobility comments: Extra time and effort and use of bed rail only during sup to sit    Transfers Overall transfer level: Needs assistance Equipment used: Rolling walker (2 wheels) Transfers: Sit to/from Stand Sit to Stand: Min guard           General transfer comment: Mod verbal cues for sequencing    Ambulation/Gait Ambulation/Gait assistance: Min guard Gait Distance (Feet): 2 Feet Assistive device: Rolling walker (2 wheels) Gait Pattern/deviations: Step-to pattern, Decreased stance time - left, Antalgic Gait velocity: decreased     General Gait Details: Pt and daughter education provided on proper sequencing with RW to maintain LLE 50% PWB status compliance; pt education provided visually and with mod to max verbal cuing during training  Stairs            Wheelchair Mobility    Modified Rankin (Stroke Patients Only)       Balance Overall balance assessment: Needs assistance Sitting-balance support: Bilateral upper extremity supported, Feet unsupported Sitting balance-Leahy Scale: Good  Standing balance support: Bilateral upper extremity supported, During functional activity Standing balance-Leahy Scale: Fair                               Pertinent Vitals/Pain  Pain Assessment Pain Assessment: 0-10 Pain Score: 4  Pain Location: L hip Pain Descriptors / Indicators: Sore Pain Intervention(s): Premedicated before session, Monitored during session, Repositioned, Ice applied    Home Living Family/patient expects to be discharged to:: Private residence Living Arrangements: Alone Available Help at Discharge: Family;Available PRN/intermittently Type of Home: House Home Access: Stairs to enter Entrance Stairs-Rails: Left Entrance Stairs-Number of Steps: 2   Home Layout: One level Home Equipment: Conservation officer, nature (2 wheels);Rollator (4 wheels);Shower seat;Cane - quad;Cane - single point;Hand held shower head      Prior Function Prior Level of Function : Independent/Modified Independent             Mobility Comments: Mod Ind amb with rollator inside of home and RW outside of home.  No fall history. ADLs Comments: Daughter assists with IADLs and transportation, pt Ind with bathing and dressing     Hand Dominance   Dominant Hand: Right    Extremity/Trunk Assessment   Upper Extremity Assessment Upper Extremity Assessment: Defer to OT evaluation    Lower Extremity Assessment Lower Extremity Assessment: Generalized weakness;LLE deficits/detail LLE Deficits / Details: BLE ankle AROM, strength, and sensation to light touch all grossly WNL LLE: Unable to fully assess due to pain LLE Sensation: WNL       Communication   Communication: HOH  Cognition Arousal/Alertness: Awake/alert Behavior During Therapy: WFL for tasks assessed/performed Overall Cognitive Status: Within Functional Limits for tasks assessed                                          General Comments      Exercises Total Joint Exercises Ankle Circles/Pumps: AROM, Strengthening, Both, 10 reps Quad Sets: Strengthening, Both, 10 reps Gluteal Sets: Strengthening, Both, 10 reps Long Arc Quad: AROM, Strengthening, Both, 10 reps Knee Flexion: AROM,  Strengthening, Both, 10 reps Marching in Standing: AROM, Strengthening, Left, 5 reps Other Exercises Other Exercises: HEP education per handout Other Exercises: Anterior hip precaution and LLE WB education Other Exercises: Nursing education on pt WB status, gait sequencing with RW for WB compliance, and anterior hip precautions   Assessment/Plan    PT Assessment Patient needs continued PT services  PT Problem List Decreased strength;Decreased activity tolerance;Decreased balance;Decreased mobility;Decreased knowledge of use of DME;Decreased knowledge of precautions;Pain       PT Treatment Interventions DME instruction;Gait training;Stair training;Functional mobility training;Therapeutic activities;Therapeutic exercise;Balance training;Patient/family education    PT Goals (Current goals can be found in the Care Plan section)  Acute Rehab PT Goals Patient Stated Goal: To walk without pain PT Goal Formulation: With patient Time For Goal Achievement: 09/17/21 Potential to Achieve Goals: Good    Frequency BID     Co-evaluation               AM-PAC PT "6 Clicks" Mobility  Outcome Measure Help needed turning from your back to your side while in a flat bed without using bedrails?: A Little Help needed moving from lying on your back to sitting on the side of a flat bed without using bedrails?: A Little Help needed moving to and from a bed to  a chair (including a wheelchair)?: A Little Help needed standing up from a chair using your arms (e.g., wheelchair or bedside chair)?: A Little Help needed to walk in hospital room?: A Lot Help needed climbing 3-5 steps with a railing? : Total 6 Click Score: 15    End of Session Equipment Utilized During Treatment: Gait belt Activity Tolerance: Other (comment) (Pt c/o min dizziness after amb from bed to chair with BP taken in sitting at 115/75, nsg notified) Patient left: in chair;with call bell/phone within reach;with family/visitor  present;with SCD's reapplied Nurse Communication: Mobility status;Weight bearing status;Other (comment) (dizziness after amb per above) PT Visit Diagnosis: Other abnormalities of gait and mobility (R26.89);Muscle weakness (generalized) (M62.81);Pain Pain - Right/Left: Left Pain - part of body: Hip    Time: 0932-6712 PT Time Calculation (min) (ACUTE ONLY): 45 min   Charges:   PT Evaluation $PT Eval Moderate Complexity: 1 Mod PT Treatments $Therapeutic Exercise: 8-22 mins $Therapeutic Activity: 8-22 mins       D. Royetta Asal PT, DPT 09/04/21, 5:42 PM

## 2021-09-04 NOTE — Transfer of Care (Addendum)
Immediate Anesthesia Transfer of Care Note  Patient: Kellie Harris  Procedure(s) Performed: TOTAL HIP ARTHROPLASTY ANTERIOR APPROACH (Left: Hip) HARDWARE REMOVAL (Left: Hip)  Patient Location: PACU  Anesthesia Type:GA combined with regional for post-op pain  Level of Consciousness: awake  Airway & Oxygen Therapy: Patient Spontanous Breathing and Patient connected to face mask oxygen  Post-op Assessment: Report given to RN and Post -op Vital signs reviewed and stable  Post vital signs: Reviewed and stable  Last Vitals:  Vitals Value Taken Time  BP 124/68 09/04/21 1246  Temp 36.8 C 09/04/21 1246  Pulse 68 09/04/21 1254  Resp 21 09/04/21 1254  SpO2 95 % 09/04/21 1254  Vitals shown include unvalidated device data.  Last Pain:  Vitals:   09/04/21 1246  TempSrc:   PainSc: 7          Complications: No notable events documented.

## 2021-09-04 NOTE — Op Note (Signed)
09/04/2021  12:51 PM  PATIENT:  Kellie Harris   MRN: [id number]  PRE-OPERATIVE DIAGNOSIS:  Left hip traumatic AVN, painful hardware  POST-OPERATIVE DIAGNOSIS: Same  Procedure: Left Total Hip Replacement, removal of intramedullary nail, left  Surgeon: Elyn Aquas. Harlow Mares, MD   Assist: [name], PA-C  Anesthesia: Spinal   EBL: 300 mL   Specimens: None   Drains: None   Components used: A size 6 Polarstem Smith [employer]hew, R3 size 48 mm shell, and a 32 mm +0 mm head    Description of the procedure in detail: After informed consent was obtained and the appropriate extremity marked in the pre-operative holding area, the patient was taken to the operating room and placed in the supine position on the fracture table. All pressure points were well padded and bilateral lower extremities were place in traction spars. The hip was prepped and draped in standard sterile fashion. A spinal anesthetic had been delivered by the anesthesia team. The skin and subcutaneous tissues were injected with a mixture of Marcaine with epinephrine for post-operative pain.   The short Synthes TFN was identified through the prior incision. The helical blade was removed, the distal interlock was removed and passed from the field. The proximal portion of the nail was identified and using a backslap hammer the nail was removed.  A longitudinal incision approximately 10 cm in length was carried out from the anterior superior iliac spine to the greater trochanter. The tensor fascia was divided and blunt dissection was taken down to the level of the joint capsule. The lateral circumflex vessels were cauterized. Deep retractors were placed and a portion of the anterior capsule was excised. Using fluoroscopy the neck cut was planned and carried out with a sagittal saw. The head was passed from the field with use of a corkscrew and hip skid. Deep retractors were placed along the acetabulum and the degenerative labrum and large  osteophytes were removed with a Rongeur. The cup was sequentially reamed to a size 48 mm. The wound was irrigated and using fluoroscopy the size 48 mm cup was impacted in to anatomic position. A single screw was placed followed by a threaded hole cover. The final liner was impacted in to position. Attention was then turned to the proximal femur. The leg was placed in extension and external rotation. The canal was opened and sequentially broached to a size 6. The trial components were placed and the hip relocated. The components were found to be in good position using fluoroscopy. The hip was dislocated and the trial components removed. The final components were impacted in to position and the hip relocated. The final components were again check with fluoroscopy and found to be in good position. Hemostasis was achieved with electrocautery. The deep capsule was injected with Marcaine and epinephrine. The wound was irrigated with bacitracin laced normal saline and the tensor fascia closed with #2 Quill suture. The subcutaneous tissues were closed with 2-0 vicryl and staples for the skin. A sterile dressing was applied and an abduction pillow. Patient tolerated the procedure well and there were no apparent complication. Patient was taken to the recovery room in good condition.   Kurtis Bushman, MD

## 2021-09-04 NOTE — Anesthesia Preprocedure Evaluation (Addendum)
Anesthesia Evaluation  Patient identified by MRN, date of birth, ID band Patient awake    Reviewed: Allergy & Precautions, NPO status , Patient's Chart, lab work & pertinent test results, reviewed documented beta blocker date and time   Airway Mallampati: II  TM Distance: >3 FB Neck ROM: full    Dental  (+) Poor Dentition   Pulmonary neg pulmonary ROS,    Pulmonary exam normal        Cardiovascular hypertension, Pt. on medications and Pt. on home beta blockers Normal cardiovascular exam+ dysrhythmias (RBBB)   EKG 1/23: Interpretation limited secondary to artifact Normal sinus rhythm Left axis deviation Right bundle branch block Abnormal ECG When compared with ECG of 12-Jun-2021 17:56, No significant change since last tracing Confirmed by Donnelly Angelica ([id number]) on 08/24/2021 10:48:08 AM  ECHO 6/22: 1. Left ventricular ejection fraction, by estimation, is 60 to 65%. The  left ventricle has normal function. The left ventricle has no regional  wall motion abnormalities. Left ventricular diastolic parameters are  consistent with Grade I diastolic  dysfunction (impaired relaxation).  2. Right ventricular systolic function is low normal. The right  ventricular size is normal. There is normal pulmonary artery systolic  pressure.  3. The mitral valve is abnormal. Trivial mitral valve regurgitation. No  evidence of mitral stenosis.  4. The aortic valve is tricuspid. There is moderate calcification of the  aortic valve. There is moderate thickening of the aortic valve. Aortic  valve regurgitation is mild. Mild to moderate aortic valve  sclerosis/calcification is present, without any  evidence of aortic stenosis.  5. The inferior vena cava is normal in size with <50% respiratory  variability, suggesting right atrial pressure of 8 mmHg   Neuro/Psych Pt had a negative stroke work-up 11/22 for aphasia.  Pt has an appointment with  neurology to presumably evaluate for potential seizures. She has been asymptomatic since last admission.   negative psych ROS   GI/Hepatic Neg liver ROS, hiatal hernia,   Endo/Other  negative endocrine ROS  Renal/GU Hydronephrosis Right     Musculoskeletal  (+) Arthritis , Degenerative disc disease, lumbar   Abdominal Normal abdominal exam  (+)   Peds  Hematology negative hematology ROS (+)   Anesthesia Other Findings Past Medical History: No date: Arthritis     Comment:  right knee, left shoulder/ OSTEO No date: Cancer ([place])     Comment:  skin  No date: Degenerative disc disease, lumbar     Comment:  L4-L5 No date: Diverticulosis     Comment:  ITIS No date: History of hiatal hernia No date: Hydronephrosis     Comment:  right No date: Hypercholesteremia No date: Hypertension     Comment:  CONTROLLED ON MEDS No date: IBS (irritable bowel syndrome) 03/05/2015: Knee fracture, right     Comment:  DIFFICULTY WITH AMBULATION No date: Osteopenia No date: Ovarian cyst No date: Pancreatic insufficiency No date: Stroke [facility]) No date: Wears hearing aid     Comment:  bilateral  Past Surgical History: 11/02/2015: CATARACT EXTRACTION W/PHACO; Right     Comment:  Procedure: CATARACT EXTRACTION PHACO AND INTRAOCULAR               LENS PLACEMENT (IOC);  Surgeon: Leandrew Koyanagi, MD;               Location: [place];  Service: Ophthalmology;                Laterality: Right; 07/18/2016: CATARACT EXTRACTION W/PHACO; Left  Comment:  Procedure: CATARACT EXTRACTION PHACO AND INTRAOCULAR               LENS PLACEMENT ([place]);  Surgeon: Leandrew Koyanagi, MD;               Location: [place];  Service: Ophthalmology;                Laterality: Left; No date: COLONOSCOPY No date: FRACTURE SURGERY; Left     Comment:  hip No date: history of left hip fracture 02/12/2017: INTRAMEDULLARY (IM) NAIL INTERTROCHANTERIC; Left     Comment:  Procedure:  INTRAMEDULLARY (IM) NAIL INTERTROCHANTRIC;                Surgeon: Earnestine Leys, MD;  Location: [facility];                Service: Orthopedics;  Laterality: Left; No date: kidney stent 2015: OOPHORECTOMY; Bilateral 04/08/2018: ORIF WRIST FRACTURE; Right     Comment:  Procedure: OPEN REDUCTION INTERNAL FIXATION (ORIF) WRIST              FRACTURE;  Surgeon: Altamese East Burke, MD;  Location: [place];              Service: Orthopedics;  Laterality: Right; No date: TONSILLECTOMY 08/07/2017: TOTAL KNEE ARTHROPLASTY; Right     Comment:  Procedure: TOTAL KNEE ARTHROPLASTY;  Surgeon: Earnestine Leys, MD;  Location: [facility];  Service: Orthopedics;                Laterality: Right;  BMI    Body Mass Index: 19.19 kg/m      Reproductive/Obstetrics negative OB ROS                           Anesthesia Physical Anesthesia Plan  ASA: 3  Anesthesia Plan: Spinal   Post-op Pain Management:    Induction:   PONV Risk Score and Plan: Propofol infusion and Treatment may vary due to age or medical condition  Airway Management Planned: Natural Airway and Nasal Cannula  Additional Equipment:   Intra-op Plan:   Post-operative Plan:   Informed Consent: I have reviewed the patients History and Physical, chart, labs and discussed the procedure including the risks, benefits and alternatives for the proposed anesthesia with the patient or authorized representative who has indicated his/her understanding and acceptance.     Dental Advisory Given  Plan Discussed with: Anesthesiologist, CRNA and Surgeon  Anesthesia Plan Comments: (Patient reports no bleeding problems and no anticoagulant use.  Plan for spinal with backup GA  Patient consented for risks of anesthesia including but not limited to:  - adverse reactions to medications - damage to eyes, teeth, lips or other oral mucosa - nerve damage due to positioning  - risk of bleeding, infection and or nerve  damage from spinal that could lead to paralysis - risk of headache or failed spinal - damage to teeth, lips or other oral mucosa - sore throat or hoarseness - damage to heart, brain, nerves, lungs, other parts of body or loss of life  Patient voiced understanding.)       Anesthesia Quick Evaluation

## 2021-09-04 NOTE — Anesthesia Procedure Notes (Signed)
Spinal  Patient location during procedure: OR Start time: 09/04/2021 10:00 AM Reason for block: surgical anesthesia Preanesthetic Checklist Completed: patient identified, IV checked, site marked, risks and benefits discussed, surgical consent, monitors and equipment checked, pre-op evaluation and timeout performed Spinal Block Patient position: sitting Prep: Betadine Patient monitoring: heart rate, continuous pulse ox, blood pressure and cardiac monitor Approach: midline Location: L4-5 Injection technique: single-shot Needle Needle type: Whitacre and Introducer  Needle gauge: 24 G Needle length: 9 cm Assessment Events: CSF return Additional Notes Negative paresthesia. Negative blood return. Positive free-flowing CSF. Expiration date of kit checked and confirmed. Patient tolerated procedure well, without complications.

## 2021-09-05 LAB — BASIC METABOLIC PANEL
Anion gap: 9 (ref 5–15)
BUN: 14 mg/dL (ref 8–23)
CO2: 26 mmol/L (ref 22–32)
Calcium: 8.6 mg/dL — ABNORMAL LOW (ref 8.9–10.3)
Chloride: 103 mmol/L (ref 98–111)
Creatinine, Ser: 0.7 mg/dL (ref 0.44–1.00)
GFR, Estimated: >60 mL/min (ref >60)
Glucose, Bld: 129 mg/dL — ABNORMAL HIGH (ref 70–99)
Potassium: 3.9 mmol/L (ref 3.5–5.1)
Sodium: 138 mmol/L (ref 135–145)

## 2021-09-05 LAB — CBC
HCT: 28.6 % — ABNORMAL LOW (ref 36.0–46.0)
Hemoglobin: 9.5 g/dL — ABNORMAL LOW (ref 12.0–15.0)
MCH: 30.2 pg (ref 26.0–34.0)
MCHC: 33.2 g/dL (ref 30.0–36.0)
MCV: 90.8 fL (ref 80.0–100.0)
Platelets: 163 10*3/uL (ref 150–400)
RBC: 3.15 MIL/uL — ABNORMAL LOW (ref 3.87–5.11)
RDW: 14.6 % (ref 11.5–15.5)
WBC: 13.9 10*3/uL — ABNORMAL HIGH (ref 4.0–10.5)
nRBC: 0 % (ref 0.0–0.2)

## 2021-09-05 MED ORDER — DOCUSATE SODIUM 100 MG PO CAPS
ORAL_CAPSULE | ORAL | Status: AC
  Administered 2021-09-05: 23:00:00 100 mg via ORAL
  Filled 2021-09-05: qty 1

## 2021-09-05 MED ORDER — DOCUSATE SODIUM 100 MG PO CAPS
ORAL_CAPSULE | ORAL | Status: AC
  Administered 2021-09-05: 10:00:00 100 mg via ORAL
  Filled 2021-09-05: qty 1

## 2021-09-05 MED ORDER — KETOROLAC TROMETHAMINE 15 MG/ML IJ SOLN
INTRAMUSCULAR | Status: AC
  Administered 2021-09-05: 05:00:00 7.5 mg via INTRAVENOUS
  Filled 2021-09-05: qty 1

## 2021-09-05 MED ORDER — ASPIRIN 81 MG PO CHEW
CHEWABLE_TABLET | ORAL | Status: AC
  Administered 2021-09-05: 23:00:00 81 mg via ORAL
  Filled 2021-09-05: qty 1

## 2021-09-05 MED ORDER — KETOROLAC TROMETHAMINE 15 MG/ML IJ SOLN
INTRAMUSCULAR | Status: AC
  Filled 2021-09-05: qty 1

## 2021-09-05 MED ORDER — ASPIRIN 81 MG PO CHEW
CHEWABLE_TABLET | ORAL | Status: AC
  Administered 2021-09-05: 10:00:00 81 mg via ORAL
  Filled 2021-09-05: qty 1

## 2021-09-05 NOTE — Progress Notes (Signed)
°  Subjective:  Patient reports pain as mild.  Up eating lunch. Her daughter is in the room.  Objective:   VITALS:   Vitals:   09/05/21 0000 09/05/21 0400 09/05/21 0750 09/05/21 1139  BP: (!) 153/76 (!) 184/95 (!) 153/82 95/60  Pulse: 71 82 77 78  Resp: 16 16 16 16   Temp: (!) 97.3 F (36.3 C) (!) 97.5 F (36.4 C) 98 F (36.7 C) 97.9 F (36.6 C)  TempSrc: Temporal Temporal Temporal Temporal  SpO2: 98% 97% 98% 100%  Weight:      Height:        PHYSICAL EXAM:  Sensation intact distally Dorsiflexion/Plantar flexion intact Incision: scant drainage No cellulitis present Compartment soft  LABS  Results for orders placed or performed during the hospital encounter of 09/04/21 (from the past 24 hour(s))  CBC     Status: Abnormal   Collection Time: 09/05/21  5:56 AM  Result Value Ref Range   WBC 13.9 (H) 4.0 - 10.5 K/uL   RBC 3.15 (L) 3.87 - 5.11 MIL/uL   Hemoglobin 9.5 (L) 12.0 - 15.0 g/dL   HCT 28.6 (L) 36.0 - 46.0 %   MCV 90.8 80.0 - 100.0 fL   MCH 30.2 26.0 - 34.0 pg   MCHC 33.2 30.0 - 36.0 g/dL   RDW 14.6 11.5 - 15.5 %   Platelets 163 150 - 400 K/uL   nRBC 0.0 0.0 - 0.2 %  Basic metabolic panel     Status: Abnormal   Collection Time: 09/05/21  5:56 AM  Result Value Ref Range   Sodium 138 135 - 145 mmol/L   Potassium 3.9 3.5 - 5.1 mmol/L   Chloride 103 98 - 111 mmol/L   CO2 26 22 - 32 mmol/L   Glucose, Bld 129 (H) 70 - 99 mg/dL   BUN 14 8 - 23 mg/dL   Creatinine, Ser 0.70 0.44 - 1.00 mg/dL   Calcium 8.6 (L) 8.9 - 10.3 mg/dL   GFR, Estimated >60 >60 mL/min   Anion gap 9 5 - 15    DG C-Arm 1-60 Min-No Report  Result Date: 09/04/2021 Fluoroscopy was utilized by the requesting physician.  No radiographic interpretation.   DG C-Arm 1-60 Min-No Report  Result Date: 09/04/2021 Fluoroscopy was utilized by the requesting physician.  No radiographic interpretation.   DG HIP UNILAT WITH PELVIS 1V LEFT  Result Date: 09/04/2021 CLINICAL DATA:  Status post left hip  replacement. EXAM: DG HIP (WITH OR WITHOUT PELVIS) 1V*L* COMPARISON:  08/24/2017 FINDINGS: Three frontal C-arm images of the left hip demonstrate an interval left total hip prosthesis in satisfactory position and alignment. The previously demonstrated fixation hardware has been removed. No fracture or dislocation seen. IMPRESSION: Satisfactory postoperative appearance of a left total hip prosthesis. Electronically Signed   By: Claudie Revering M.D.   On: 09/04/2021 14:12    Assessment/Plan: 1 Day Post-Op   Principal Problem:   History of total hip replacement, left   Up with therapy Discharge to SNF OK from ortho standpoint   Lovell Sheehan , MD 09/05/2021, 1:18 PM

## 2021-09-05 NOTE — Progress Notes (Signed)
Physical Therapy Treatment Patient Details Name: Kellie Harris MRN: [id number] DOB: [birth year] Today's Date: 09/05/2021   History of Present Illness Pt is an 88 yo F diagnosed with left hip traumatic AVN with painful hardware and is s/p L THR with removal of intramedullary nail. PMH includes DDD, diverticulosis, htn, HDL, osteopenia, pancreatic insufficiency, h/o previous CVA's (without residual deficits), R knee fx, s/p ORIF radius and ulnar fx 2019, seizures, R TKR, L hip fx, L IMN intertrochanteric 2018, previous episodes of vomiting, and aphasia.    PT Comments    Pt was pleasant and motivated to participate during the session and put forth good effort throughout. Pt impulsive at times and required constant heavy cuing to ensure proper sequencing with gait to ensure WB compliance. Pt required no physical assistance during the session, however, and reported no adverse symptoms.  Pt will benefit from PT services in a SNF setting upon discharge to safely address deficits listed in patient problem list for decreased caregiver assistance and eventual return to PLOF.     Recommendations for follow up therapy are one component of a multi-disciplinary discharge planning process, led by the attending physician.  Recommendations may be updated based on patient status, additional functional criteria and insurance authorization.  Follow Up Recommendations  Skilled nursing-short term rehab (<3 hours/day)     Assistance Recommended at Discharge Frequent or constant Supervision/Assistance  Patient can return home with the following A lot of help with walking and/or transfers;A lot of help with bathing/dressing/bathroom;Assistance with cooking/housework;Direct supervision/assist for medications management;Help with stairs or ramp for entrance;Assist for transportation   Equipment Recommendations  Other (comment) (TBD)    Recommendations for Other Services       Precautions / Restrictions  Precautions Precautions: Fall;Anterior Hip Precaution Booklet Issued: Yes (comment) Restrictions Weight Bearing Restrictions: Yes LLE Weight Bearing: Partial weight bearing LLE Partial Weight Bearing Percentage or Pounds: 50% Other Position/Activity Restrictions: Per Dr. Harlow Mares ok for limited ambulation at EOB such as bed to chair/BSC even if pt unable to maintain PWB status     Mobility  Bed Mobility Overal bed mobility: Modified Independent             General bed mobility comments: Extra time and effort only    Transfers Overall transfer level: Needs assistance Equipment used: Rolling walker (2 wheels) Transfers: Sit to/from Stand Sit to Stand: Min guard           General transfer comment: Min verbal cues for sequencing    Ambulation/Gait Ambulation/Gait assistance: Min guard Gait Distance (Feet): 4 Feet Assistive device: Rolling walker (2 wheels) Gait Pattern/deviations: Step-to pattern, Decreased stance time - left, Antalgic Gait velocity: decreased     General Gait Details: Pt education/review provided on proper sequencing with RW to maintain LLE 50% PWB status compliance; pt education provided visually and then with mod to max verbal cuing during training; pt impulsive at times with difficulty sequencing for WB compliance, MD notified and stated ok for pt to continue limited ambulation at EOB and functional transfers even if unable to maintain 50% WB compliance to the LLE   Stairs             Wheelchair Mobility    Modified Rankin (Stroke Patients Only)       Balance Overall balance assessment: Needs assistance Sitting-balance support: Bilateral upper extremity supported, Feet unsupported Sitting balance-Leahy Scale: Good     Standing balance support: Bilateral upper extremity supported, During functional activity Standing balance-Leahy Scale: Fair  Cognition Arousal/Alertness: Awake/alert Behavior  During Therapy: WFL for tasks assessed/performed Overall Cognitive Status: Within Functional Limits for tasks assessed                                          Exercises Total Joint Exercises Ankle Circles/Pumps: AROM, Strengthening, Both, 10 reps Quad Sets: Strengthening, Both, 10 reps Long Arc Quad: AROM, Strengthening, Both, 10 reps Knee Flexion: AROM, Strengthening, Both, 10 reps Marching in Standing: AROM, Strengthening, Left, 5 reps Other Exercises Other Exercises: HEP education review per handout Other Exercises: Anterior hip precaution and LLE WB education/review with pt and daughter    General Comments        Pertinent Vitals/Pain Pain Assessment Pain Assessment: 0-10 Pain Score: 4  Pain Location: L hip Pain Descriptors / Indicators: Sore Pain Intervention(s): Premedicated before session, Monitored during session, Repositioned    Home Living Family/patient expects to be discharged to:: Private residence Living Arrangements: Alone Available Help at Discharge: Family;Available PRN/intermittently Type of Home: House Home Access: Stairs to enter Entrance Stairs-Rails: Left Entrance Stairs-Number of Steps: 2   Home Layout: One level Home Equipment: Conservation officer, nature (2 wheels);Rollator (4 wheels);Shower seat;Cane - quad;Cane - single point;Hand held shower head      Prior Function            PT Goals (current goals can now be found in the care plan section) Progress towards PT goals: Progressing toward goals    Frequency    BID      PT Plan Current plan remains appropriate    Co-evaluation              AM-PAC PT "6 Clicks" Mobility   Outcome Measure  Help needed turning from your back to your side while in a flat bed without using bedrails?: A Little Help needed moving from lying on your back to sitting on the side of a flat bed without using bedrails?: A Little Help needed moving to and from a bed to a chair (including a  wheelchair)?: A Little Help needed standing up from a chair using your arms (e.g., wheelchair or bedside chair)?: A Little Help needed to walk in hospital room?: A Lot Help needed climbing 3-5 steps with a railing? : Total 6 Click Score: 15    End of Session Equipment Utilized During Treatment: Gait belt Activity Tolerance: Patient tolerated treatment well Patient left: Other (comment) (Pt left on BSC with nursing) Nurse Communication: Mobility status;Weight bearing status PT Visit Diagnosis: Other abnormalities of gait and mobility (R26.89);Muscle weakness (generalized) (M62.81);Pain Pain - Right/Left: Left Pain - part of body: Hip     Time: 1884-1660 PT Time Calculation (min) (ACUTE ONLY): 28 min  Charges:  $Therapeutic Exercise: 8-22 mins $Therapeutic Activity: 8-22 mins                     D. Scott Nara [place] PT, DPT 09/05/21, 1:58 PM

## 2021-09-05 NOTE — Progress Notes (Signed)
Physical Therapy Treatment Patient Details Name: Kellie Harris MRN: [id number] DOB: [birth year] Today's Date: 09/05/2021   History of Present Illness Pt is an 88 yo F diagnosed with left hip traumatic AVN with painful hardware and is s/p L THR with removal of intramedullary nail. PMH includes DDD, diverticulosis, htn, HDL, osteopenia, pancreatic insufficiency, h/o previous CVA's (without residual deficits), R knee fx, s/p ORIF radius and ulnar fx 2019, seizures, R TKR, L hip fx, L IMN intertrochanteric 2018, previous episodes of vomiting, and aphasia.    PT Comments    Pt was pleasant and motivated to participate during the session and put forth good effort throughout. Pt continued to require extra time and effort with sup to sit and needed min A for LLE control during sit to sup.  Pt was able to amb limited distances at the EOB with extensive multi-modal cuing for sequencing and to wait for commands prior to initiating steps. Pt remains at an elevated risk for non-compliance with WB restrictions.  Pt will benefit from PT services in a SNF setting upon discharge to safely address deficits listed in patient problem list for decreased caregiver assistance and eventual return to PLOF.    Recommendations for follow up therapy are one component of a multi-disciplinary discharge planning process, led by the attending physician.  Recommendations may be updated based on patient status, additional functional criteria and insurance authorization.  Follow Up Recommendations  Skilled nursing-short term rehab (<3 hours/day)     Assistance Recommended at Discharge Frequent or constant Supervision/Assistance  Patient can return home with the following A lot of help with walking and/or transfers;A lot of help with bathing/dressing/bathroom;Assistance with cooking/housework;Direct supervision/assist for medications management;Help with stairs or ramp for entrance;Assist for transportation   Equipment  Recommendations  Other (comment) (TBD at next venue of care)    Recommendations for Other Services       Precautions / Restrictions Precautions Precautions: Fall;Anterior Hip Precaution Booklet Issued: Yes (comment) Restrictions Weight Bearing Restrictions: Yes LLE Weight Bearing: Partial weight bearing LLE Partial Weight Bearing Percentage or Pounds: 50% Other Position/Activity Restrictions: Per Dr. Harlow Mares ok for limited ambulation at EOB such as bed to chair/BSC even if pt unable to maintain PWB status     Mobility  Bed Mobility Overal bed mobility: Needs Assistance Bed Mobility: Sit to Supine       Sit to supine: Min assist   General bed mobility comments: Extra time and effort only with sup to sit and min A to manage the LLE during sit to sup    Transfers Overall transfer level: Needs assistance Equipment used: Rolling walker (2 wheels) Transfers: Sit to/from Stand Sit to Stand: Min guard           General transfer comment: Min verbal cues for sequencing    Ambulation/Gait Ambulation/Gait assistance: Min guard Gait Distance (Feet): 5 Feet Assistive device: Rolling walker (2 wheels) Gait Pattern/deviations: Step-to pattern, Decreased stance time - left, Antalgic Gait velocity: decreased     General Gait Details: Pt education/review provided on proper sequencing with RW to maintain LLE 50% PWB status compliance; pt education provided visually and then with mod to max verbal cuing during training; pt impulsive at times with difficulty sequencing for WB compliance, MD notified and stated ok for pt to continue limited ambulation at EOB and functional transfers even if unable to maintain 50% WB compliance to the LLE   Stairs             Wheelchair Mobility  Modified Rankin (Stroke Patients Only)       Balance Overall balance assessment: Needs assistance Sitting-balance support: Bilateral upper extremity supported, Feet unsupported Sitting  balance-Leahy Scale: Good     Standing balance support: Bilateral upper extremity supported, During functional activity Standing balance-Leahy Scale: Fair                              Cognition Arousal/Alertness: Awake/alert Behavior During Therapy: WFL for tasks assessed/performed Overall Cognitive Status: Within Functional Limits for tasks assessed                                          Exercises Total Joint Exercises Ankle Circles/Pumps: AROM, Strengthening, Both, 10 reps Quad Sets: Strengthening, Both, 10 reps Long Arc Quad: AROM, Strengthening, Both, 10 reps Knee Flexion: AROM, Strengthening, Both, 10 reps Marching in Standing: AROM, Strengthening, Left, 5 reps Other Exercises Other Exercises: HEP education review per handout Other Exercises: Anterior hip precaution and LLE WB education/review with pt and daughter    General Comments        Pertinent Vitals/Pain Pain Assessment Pain Assessment: 0-10 Pain Score: 2  Pain Location: L hip Pain Descriptors / Indicators: Sore Pain Intervention(s): Premedicated before session, Monitored during session, Repositioned, Ice applied    Home Living Family/patient expects to be discharged to:: Private residence Living Arrangements: Alone Available Help at Discharge: Family;Available PRN/intermittently Type of Home: House Home Access: Stairs to enter Entrance Stairs-Rails: Left Entrance Stairs-Number of Steps: 2   Home Layout: One level Home Equipment: Conservation officer, nature (2 wheels);Rollator (4 wheels);Shower seat;Cane - quad;Cane - single point;Hand held shower head      Prior Function            PT Goals (current goals can now be found in the care plan section) Progress towards PT goals: Progressing toward goals    Frequency    BID      PT Plan Current plan remains appropriate    Co-evaluation              AM-PAC PT "6 Clicks" Mobility   Outcome Measure  Help needed  turning from your back to your side while in a flat bed without using bedrails?: A Little Help needed moving from lying on your back to sitting on the side of a flat bed without using bedrails?: A Little Help needed moving to and from a bed to a chair (including a wheelchair)?: A Little Help needed standing up from a chair using your arms (e.g., wheelchair or bedside chair)?: A Little Help needed to walk in hospital room?: A Lot Help needed climbing 3-5 steps with a railing? : Total 6 Click Score: 15    End of Session Equipment Utilized During Treatment: Gait belt Activity Tolerance: Patient tolerated treatment well Patient left: in bed;with call bell/phone within reach;with family/visitor present Nurse Communication: Mobility status;Weight bearing status PT Visit Diagnosis: Other abnormalities of gait and mobility (R26.89);Muscle weakness (generalized) (M62.81);Pain Pain - Right/Left: Left Pain - part of body: Hip     Time: 8546-2703 PT Time Calculation (min) (ACUTE ONLY): 25 min  Charges:  $Gait Training: 8-22 mins $Therapeutic Exercise: 8-22 mins $Therapeutic Activity: 8-22 mins                     D. Scott Anvika Gashi PT, DPT 09/05/21, 3:16 PM

## 2021-09-05 NOTE — TOC Initial Note (Signed)
Transition of Care [facility]) - Initial/Assessment Note    Patient Details  Name: Kellie Harris MRN: [id number] Date of Birth: 1931/01/23  [facility]) CM/SW Contact:    Conception Oms, RN Phone Number: 09/05/2021, 2:51 PM  Clinical Narrative:       Met with the patient and her son, She stated that she feels that she needs to go to [facility] SNF, She has been at [facility] before and liked them Her PASSR number is [id number], bedsearch sent, will review once obtained bed offer                  Patient Goals and CMS Choice        Expected Discharge Plan and Services                                                Prior Living Arrangements/Services                       Activities of Daily Living Home Assistive Devices/Equipment: Environmental consultant (specify type), Hearing aid, Shower chair with back ADL Screening (condition at time of admission) Patient's cognitive ability adequate to safely complete daily activities?: Yes Is the patient deaf or have difficulty hearing?: Yes Does the patient have difficulty seeing, even when wearing glasses/contacts?: No Does the patient have difficulty concentrating, remembering, or making decisions?: No Patient able to express need for assistance with ADLs?: Yes Does the patient have difficulty dressing or bathing?: No Independently performs ADLs?: Yes (appropriate for developmental age) Does the patient have difficulty walking or climbing stairs?: Yes Weakness of Legs: Both Weakness of Arms/Hands: Both  Permission Sought/Granted                  Emotional Assessment              Admission diagnosis:  History of total hip replacement, left [R15.400] Patient Active Problem List   Diagnosis Date Noted   History of total hip replacement, left 09/04/2021   Dizziness 01/28/2021   Aphasia 05/03/2020   S/P ORIF (open reduction internal fixation) fracture radius and ulnar fx 04/08/18 04/10/2018   Distal radius and ulna  fracture, right    History of CVA (cerebrovascular accident)    Benign essential HTN    Tachycardia    Leukocytosis    Seizures (HCC)    Stroke-like episode ([place]) s/p IV tPA 04/03/2018   Syncope    TIA (transient ischemic attack) 11/04/2017   Malnutrition of moderate degree 08/26/2017   Total knee replacement status 08/07/2017   Closed left hip fracture ([place]) 02/12/2017   Essential hypertension 02/12/2017   HLD (hyperlipidemia) 02/12/2017   PCP:  Dion Body, MD Pharmacy:   [facility], [place] [address] Phone: [phone] Fax: [phone]     Social Determinants of Health (SDOH) Interventions    Readmission Risk Interventions No flowsheet data found.

## 2021-09-05 NOTE — Evaluation (Signed)
Occupational Therapy Evaluation Patient Details Name: Kellie Harris MRN: [id number] DOB: 03/07/1931 Today's Date: 09/05/2021   History of Present Illness Pt is an 88 yo F diagnosed with left hip traumatic AVN with painful hardware and is s/p L THR with removal of intramedullary nail. PMH includes DDD, diverticulosis, htn, HDL, osteopenia, pancreatic insufficiency, h/o previous CVA's (without residual deficits), R knee fx, s/p ORIF radius and ulnar fx 2019, seizures, R TKR, L hip fx, L IMN intertrochanteric 2018, previous episodes of vomiting, and aphasia.   Clinical Impression   Patient presenting with decreased Ind in self care, balance, functional mobility/transfers, endurance, and safety awareness. Patient reports being mod I at baseline with use of rollator. She has paid person assist with deep cleaning but is independent in all other aspects.  Patient currently functioning at min - mod A for functional mobility and self care tasks. OT anticipates pt will need mod A for LB self care. Pt unable to maintain PWB status with functional mobility in the room. Pt needing mod multimodal cuing for precautions.  Patient will benefit from acute OT to increase overall independence in the areas of ADLs, functional mobility, and safety awareness in order to safely discharge to next venue of care.      Recommendations for follow up therapy are one component of a multi-disciplinary discharge planning process, led by the attending physician.  Recommendations may be updated based on patient status, additional functional criteria and insurance authorization.   Follow Up Recommendations  Skilled nursing-short term rehab (<3 hours/day)    Assistance Recommended at Discharge Frequent or constant Supervision/Assistance  Patient can return home with the following A lot of help with walking and/or transfers;A lot of help with bathing/dressing/bathroom;Help with stairs or ramp for entrance;Assist for transportation     Functional Status Assessment  Patient has had a recent decline in their functional status and demonstrates the ability to make significant improvements in function in a reasonable and predictable amount of time.  Equipment Recommendations  Other (comment) (defer to next venue of care)       Precautions / Restrictions Precautions Precautions: Fall;Anterior Hip Restrictions Weight Bearing Restrictions: Yes LLE Weight Bearing: Partial weight bearing LLE Partial Weight Bearing Percentage or Pounds: 50      Mobility Bed Mobility               General bed mobility comments: Pt in recliner chair    Transfers Overall transfer level: Needs assistance Equipment used: Rolling walker (2 wheels) Transfers: Sit to/from Stand Sit to Stand: Min assist           General transfer comment: Mod verbal cues for sequencing      Balance Overall balance assessment: Needs assistance Sitting-balance support: Bilateral upper extremity supported, Feet unsupported Sitting balance-Leahy Scale: Good     Standing balance support: Bilateral upper extremity supported, During functional activity Standing balance-Leahy Scale: Fair                             ADL either performed or assessed with clinical judgement   ADL Overall ADL's : Needs assistance/impaired Eating/Feeding: Modified independent   Grooming: Wash/[place] hands;Wash/[place] face;Sitting;Set up;Supervision/safety                                 General ADL Comments: Pt unable to follow precautions this session. OT anticipates min - mod A for LB  self care. Set up A for UB self care.     Vision Patient Visual Report: No change from baseline       Perception     Praxis      Pertinent Vitals/Pain Pain Assessment Pain Assessment: 0-10 Pain Score: 4  Pain Location: L hip Pain Descriptors / Indicators: Sore Pain Intervention(s): Premedicated before session, Monitored during session, Repositioned, Ice  applied     Hand Dominance Right   Extremity/Trunk Assessment Upper Extremity Assessment Upper Extremity Assessment: Generalized weakness;RUE deficits/detail;LUE deficits/detail RUE Deficits / Details: limited AROM for shoulder elevation at baseline. She is overall functional but does have difficulty with washing and fixing hair. LUE Deficits / Details: limited AROM for shoulder elevation at baseline. She is overall functional but does have difficulty with washing and fixing hair.           Communication Communication Communication: HOH   Cognition Arousal/Alertness: Awake/alert Behavior During Therapy: WFL for tasks assessed/performed Overall Cognitive Status: Within Functional Limits for tasks assessed                                       General Comments       Exercises     Shoulder Instructions      Home Living Family/patient expects to be discharged to:: Private residence Living Arrangements: Alone Available Help at Discharge: Family;Available PRN/intermittently Type of Home: House Home Access: Stairs to enter [employer] of Steps: 2 Entrance Stairs-Rails: Left Home Layout: One level     Bathroom Shower/Tub: Teacher, early years/pre: Handicapped height     Home Equipment: Conservation officer, nature (2 wheels);Rollator (4 wheels);Shower seat;Cane - quad;Cane - single point;Hand held shower head          Prior Functioning/Environment Prior Level of Function : Independent/Modified Independent             Mobility Comments: Mod Ind amb with rollator inside of home and RW outside of home.  No fall history. ADLs Comments: Daughter assists with IADLs and transportation, pt Ind with bathing and dressing. Has paid person come for deep cleaning.        OT Problem List: Decreased strength;Decreased activity tolerance;Impaired balance (sitting and/or standing);Decreased safety awareness;Pain;Decreased knowledge of use of DME or  AE;Decreased knowledge of precautions;Decreased range of motion      OT Treatment/Interventions: Self-care/ADL training;Manual therapy;Therapeutic exercise;Patient/family education;Balance training;Energy conservation;Therapeutic activities;DME and/or AE instruction    OT Goals(Current goals can be found in the care plan section) Acute Rehab OT Goals Patient Stated Goal: to go to rehab OT Goal Formulation: With patient/family Time For Goal Achievement: 09/19/21 Potential to Achieve Goals: Fair  OT Frequency: Min 2X/week    Co-evaluation              AM-PAC OT "6 Clicks" Daily Activity     Outcome Measure Help from another person eating meals?: None Help from another person taking care of personal grooming?: A Little Help from another person toileting, which includes using toliet, bedpan, or urinal?: A Lot Help from another person bathing (including washing, rinsing, drying)?: A Lot Help from another person to put on and taking off regular upper body clothing?: A Little Help from another person to put on and taking off regular lower body clothing?: A Lot 6 Click Score: 16   End of Session Equipment Utilized During Treatment: Rolling walker (2 wheels) Nurse Communication: Mobility status;Precautions;Weight bearing  status  Activity Tolerance: Patient tolerated treatment well Patient left: in chair;with call bell/phone within reach;with family/visitor present  OT Visit Diagnosis: Unsteadiness on feet (R26.81);Muscle weakness (generalized) (M62.81)                Time: 1019-1040 OT Time Calculation (min): 21 min Charges:  OT General Charges $OT Visit: 1 Visit OT Evaluation $OT Eval Moderate Complexity: 1 Mod OT Treatments $Therapeutic Activity: 8-22 mins  Darleen Crocker, MS, OTR/L , CBIS ascom [phone]  09/05/21, 1:45 PM

## 2021-09-05 NOTE — NC FL2 (Signed)
[place] LEVEL OF CARE SCREENING TOOL     IDENTIFICATION  Patient Name: Kellie Harris Birthdate: [birth year] Sex: female Admission Date (Current Location): 09/04/2021  [place] and [place] Number:  Engineering geologist and Address:  [facility], [address] [place], [place] [address]      Provider Number: [id number]  Attending Physician Name and Address:  Lovell Sheehan, MD  Relative Name and Phone Number:  [name] [phone]    Current Level of Care:   Recommended Level of Care: [place] Prior Approval Number:    Date Approved/Denied:   PASRR Number: [id number]  Discharge Plan: SNF    Current Diagnoses: Patient Active Problem List   Diagnosis Date Noted   History of total hip replacement, left 09/04/2021   Dizziness 01/28/2021   Aphasia 05/03/2020   S/P ORIF (open reduction internal fixation) fracture radius and ulnar fx 04/08/18 04/10/2018   Distal radius and ulna fracture, right    History of CVA (cerebrovascular accident)    Benign essential HTN    Tachycardia    Leukocytosis    Seizures (HCC)    Stroke-like episode ([place]) s/p IV tPA 04/03/2018   Syncope    TIA (transient ischemic attack) 11/04/2017   Malnutrition of moderate degree 08/26/2017   Total knee replacement status 08/07/2017   Closed left hip fracture ([place]) 02/12/2017   Essential hypertension 02/12/2017   HLD (hyperlipidemia) 02/12/2017    Orientation RESPIRATION BLADDER Height & Weight     Self, Time, Situation, Place  Normal Continent, External catheter Weight: 43.1 kg Height:  4\' 11"  (149.9 cm)  BEHAVIORAL SYMPTOMS/MOOD NEUROLOGICAL BOWEL NUTRITION STATUS      Continent Diet (regular)  AMBULATORY STATUS COMMUNICATION OF NEEDS Skin   Extensive Assist Verbally Normal, Surgical wounds                       Personal Care Assistance Level of Assistance  Bathing, Feeding Bathing Assistance: Limited assistance Feeding assistance:  Independent       Functional Limitations Info             SPECIAL CARE FACTORS FREQUENCY  PT (By licensed PT), OT (By licensed OT)     PT Frequency: 5 times per week OT Frequency: 5 times per week            Contractures Contractures Info: Not present    Additional Factors Info  Code Status, Allergies Code Status Info: Full code Allergies Info: NKDA           Current Medications (09/05/2021):  This is the current hospital active medication list Current Facility-Administered Medications  Medication Dose Route Frequency Provider Last Rate Last Admin   acetaminophen (TYLENOL) tablet 325-650 mg  325-650 mg Oral Q6H PRN Lovell Sheehan, MD       alum & mag hydroxide-simeth (MAALOX/MYLANTA) 200-200-20 MG/5ML suspension 30 mL  30 mL Oral Q4H PRN Lovell Sheehan, MD       amLODipine (NORVASC) tablet 5 mg  5 mg Oral Daily Lovell Sheehan, MD   5 mg at 09/05/21 7353   aspirin chewable tablet 81 mg  81 mg Oral BID Lovell Sheehan, MD   81 mg at 09/05/21 2992   bisacodyl (DULCOLAX) suppository 10 mg  10 mg Rectal Daily PRN Lovell Sheehan, MD       docusate sodium (COLACE) capsule 100 mg  100 mg Oral BID Lovell Sheehan, MD  100 mg at 09/05/21 0941   HYDROcodone-acetaminophen (NORCO) 7.5-325 MG per tablet 1-2 tablet  1-2 tablet Oral Q4H PRN Lovell Sheehan, MD       HYDROcodone-acetaminophen (NORCO/VICODIN) 5-325 MG per tablet 1-2 tablet  1-2 tablet Oral Q4H PRN Lovell Sheehan, MD   1 tablet at 09/04/21 1537   lactated ringers infusion   Intravenous Continuous Lovell Sheehan, MD 75 mL/hr at 09/05/21 0014 Restarted at 09/05/21 0014   lisinopril (ZESTRIL) tablet 20 mg  20 mg Oral BID Lovell Sheehan, MD   20 mg at 09/05/21 8588   magnesium hydroxide (MILK OF MAGNESIA) suspension 30 mL  30 mL Oral Daily PRN Lovell Sheehan, MD       menthol-cetylpyridinium (CEPACOL) lozenge 3 mg  1 lozenge Oral PRN Lovell Sheehan, MD       Or   phenol (CHLORASEPTIC) mouth spray 1 spray  1 spray  Mouth/Throat PRN Lovell Sheehan, MD       metoCLOPramide (REGLAN) tablet 5-10 mg  5-10 mg Oral Q8H PRN Lovell Sheehan, MD       Or   metoCLOPramide (REGLAN) injection 5-10 mg  5-10 mg Intravenous Q8H PRN Lovell Sheehan, MD       metoprolol succinate (TOPROL-XL) 24 hr tablet 12.5 mg  12.5 mg Oral Daily Lovell Sheehan, MD   12.5 mg at 09/05/21 0942   morphine 4 MG/ML injection 0.52-1 mg  0.52-1 mg Intravenous Q2H PRN Lovell Sheehan, MD       ondansetron [facility]) tablet 4 mg  4 mg Oral Q6H PRN Lovell Sheehan, MD       Or   ondansetron [facility]) injection 4 mg  4 mg Intravenous Q6H PRN Lovell Sheehan, MD       pravastatin (PRAVACHOL) tablet 40 mg  40 mg Oral QHS Lovell Sheehan, MD   40 mg at 09/04/21 2011     Discharge Medications: Please see discharge summary for a list of discharge medications.  Relevant Imaging Results:  Relevant Lab Results:   Additional Information SS#: [id number]  Conception Oms, RN

## 2021-09-05 NOTE — Anesthesia Postprocedure Evaluation (Signed)
Anesthesia Post Note  Patient: Kellie Harris Service  Procedure(s) Performed: TOTAL HIP ARTHROPLASTY ANTERIOR APPROACH (Left: Hip) HARDWARE REMOVAL (Left: Hip)  Patient location during evaluation: Nursing Unit Anesthesia Type: Combined General/Spinal Level of consciousness: oriented and awake and alert Pain management: pain level controlled Vital Signs Assessment: post-procedure vital signs reviewed and stable Respiratory status: spontaneous breathing and respiratory function stable Cardiovascular status: blood pressure returned to baseline and stable Postop Assessment: no headache, no backache, no apparent nausea or vomiting and patient able to bend at knees Anesthetic complications: no   No notable events documented.   Last Vitals:  Vitals:   09/05/21 0000 09/05/21 0400  BP: (!) 153/76 (!) 184/95  Pulse: 71 82  Resp: 16 16  Temp: (!) 36.3 C (!) 36.4 C  SpO2: 98% 97%    Last Pain:  Vitals:   09/05/21 0400  TempSrc: Temporal  PainSc:                  Hedda Slade

## 2021-09-06 DIAGNOSIS — K588 Other irritable bowel syndrome: Secondary | ICD-10-CM | POA: Diagnosis not present

## 2021-09-06 DIAGNOSIS — I10 Essential (primary) hypertension: Secondary | ICD-10-CM | POA: Diagnosis not present

## 2021-09-06 DIAGNOSIS — R531 Weakness: Secondary | ICD-10-CM | POA: Diagnosis not present

## 2021-09-06 DIAGNOSIS — T148XXS Other injury of unspecified body region, sequela: Secondary | ICD-10-CM | POA: Diagnosis not present

## 2021-09-06 DIAGNOSIS — E569 Vitamin deficiency, unspecified: Secondary | ICD-10-CM | POA: Diagnosis not present

## 2021-09-06 DIAGNOSIS — R569 Unspecified convulsions: Secondary | ICD-10-CM | POA: Diagnosis not present

## 2021-09-06 DIAGNOSIS — I2510 Atherosclerotic heart disease of native coronary artery without angina pectoris: Secondary | ICD-10-CM | POA: Diagnosis not present

## 2021-09-06 DIAGNOSIS — R52 Pain, unspecified: Secondary | ICD-10-CM | POA: Diagnosis not present

## 2021-09-06 DIAGNOSIS — Z7401 Bed confinement status: Secondary | ICD-10-CM | POA: Diagnosis not present

## 2021-09-06 DIAGNOSIS — D72829 Elevated white blood cell count, unspecified: Secondary | ICD-10-CM | POA: Diagnosis not present

## 2021-09-06 DIAGNOSIS — R5381 Other malaise: Secondary | ICD-10-CM | POA: Diagnosis not present

## 2021-09-06 DIAGNOSIS — I639 Cerebral infarction, unspecified: Secondary | ICD-10-CM | POA: Diagnosis not present

## 2021-09-06 DIAGNOSIS — E785 Hyperlipidemia, unspecified: Secondary | ICD-10-CM | POA: Diagnosis not present

## 2021-09-06 DIAGNOSIS — Z4789 Encounter for other orthopedic aftercare: Secondary | ICD-10-CM | POA: Diagnosis not present

## 2021-09-06 LAB — RESP PANEL BY RT-PCR (FLU A&B, COVID) ARPGX2
Influenza A by PCR: NEGATIVE
Influenza B by PCR: NEGATIVE
SARS Coronavirus 2 by RT PCR: NEGATIVE

## 2021-09-06 LAB — CBC
HCT: 25.4 % — ABNORMAL LOW (ref 36.0–46.0)
Hemoglobin: 8.5 g/dL — ABNORMAL LOW (ref 12.0–15.0)
MCH: 30.1 pg (ref 26.0–34.0)
MCHC: 33.5 g/dL (ref 30.0–36.0)
MCV: 90.1 fL (ref 80.0–100.0)
Platelets: 128 10*3/uL — ABNORMAL LOW (ref 150–400)
RBC: 2.82 MIL/uL — ABNORMAL LOW (ref 3.87–5.11)
RDW: 14.7 % (ref 11.5–15.5)
WBC: 12.8 10*3/uL — ABNORMAL HIGH (ref 4.0–10.5)
nRBC: 0 % (ref 0.0–0.2)

## 2021-09-06 LAB — SURGICAL PATHOLOGY

## 2021-09-06 MED ORDER — ASPIRIN 81 MG PO CHEW
CHEWABLE_TABLET | ORAL | Status: AC
  Administered 2021-09-06: 10:00:00 81 mg via ORAL
  Filled 2021-09-06: qty 1

## 2021-09-06 MED ORDER — HYDROCODONE-ACETAMINOPHEN 5-325 MG PO TABS
ORAL_TABLET | ORAL | Status: AC
  Filled 2021-09-06: qty 1

## 2021-09-06 MED ORDER — ASPIRIN 81 MG PO CHEW: 81.0000 mg | CHEWABLE_TABLET | Freq: Two times a day (BID) | ORAL | 0 refills | Status: DC

## 2021-09-06 MED ORDER — HYDROCODONE-ACETAMINOPHEN 5-325 MG PO TABS: 1.0000 | ORAL_TABLET | ORAL | 0 refills | Status: DC | PRN

## 2021-09-06 MED ORDER — DOCUSATE SODIUM 100 MG PO CAPS: 100.0000 mg | ORAL_CAPSULE | Freq: Two times a day (BID) | ORAL | 0 refills | Status: DC

## 2021-09-06 NOTE — TOC Progression Note (Signed)
Transition of Care [facility]) - Progression Note    Patient Details  Name: Kellie Harris MRN: [id number] Date of Birth: [birth year]  Transition of Care [facility]) CM/SW Alex, RN Phone Number: 09/06/2021, 10:14 AM  Clinical Narrative:   Reviewed the bed offers with the patient and her son in the room, She chose [facility], I called [facility] and spoke to Tammy requested Ins approval to go today will transport via EMS , she will go home after [facility] SN[facility]          Expected Discharge Plan and Services                                                 Social Determinants of Health (SDOH) Interventions    Readmission Risk Interventions No flowsheet data found.

## 2021-09-06 NOTE — TOC Progression Note (Addendum)
Transition of Care [facility]) - Progression Note    Patient Details  Name: Kellie Harris MRN: [id number] Date of Birth: [birth year]  Transition of Care [facility]) CM/SW Doyle, RN Phone Number: 09/06/2021, 2:35 PM  Clinical Narrative:   Received a call from [place] [facility] [phone], EMS auth [id number], covid test is negative, Notified the patient Provided the bedside nurse the number to call report, The Bedside nurse to call EMS          Expected Discharge Plan and Services           Expected Discharge Date: 09/06/21                                     Social Determinants of Health (SDOH) Interventions    Readmission Risk Interventions No flowsheet data found.

## 2021-09-06 NOTE — Care Management Important Message (Signed)
Important Message  Patient Details  Name: Kellie Harris MRN: [id number] Date of Birth: 10-06-1930   Medicare Important Message Given:  N/A - LOS <3 / Initial given by admissions     Dannette Barbara 09/06/2021, 8:15 AM

## 2021-09-06 NOTE — Progress Notes (Signed)
°  Subjective:  Patient reports pain as mild.    Objective:   VITALS:   Vitals:   09/05/21 1659 09/05/21 2000 09/06/21 0400 09/06/21 0730  BP: (!) 162/82 (!) 158/71 (!) 145/77 (!) 157/82  Pulse: 75 87 90 79  Resp: 16 16 16 16   Temp: 98.5 F (36.9 C) 99.1 F (37.3 C) 98 F (36.7 C) 98.8 F (37.1 C)  TempSrc: Temporal Temporal Temporal   SpO2: 97% 94% 97% 98%  Weight:      Height:        PHYSICAL EXAM:  Neurologically intact ABD soft Neurovascular intact Sensation intact distally Intact pulses distally Dorsiflexion/Plantar flexion intact Incision: dressing C/D/I No cellulitis present Compartment soft  LABS  Results for orders placed or performed during the hospital encounter of 09/04/21 (from the past 24 hour(s))  CBC     Status: Abnormal   Collection Time: 09/06/21  6:40 AM  Result Value Ref Range   WBC 12.8 (H) 4.0 - 10.5 K/uL   RBC 2.82 (L) 3.87 - 5.11 MIL/uL   Hemoglobin 8.5 (L) 12.0 - 15.0 g/dL   HCT 25.4 (L) 36.0 - 46.0 %   MCV 90.1 80.0 - 100.0 fL   MCH 30.1 26.0 - 34.0 pg   MCHC 33.5 30.0 - 36.0 g/dL   RDW 14.7 11.5 - 15.5 %   Platelets 128 (L) 150 - 400 K/uL   nRBC 0.0 0.0 - 0.2 %    DG C-Arm 1-60 Min-No Report  Result Date: 09/04/2021 Fluoroscopy was utilized by the requesting physician.  No radiographic interpretation.   DG C-Arm 1-60 Min-No Report  Result Date: 09/04/2021 Fluoroscopy was utilized by the requesting physician.  No radiographic interpretation.   DG HIP UNILAT WITH PELVIS 1V LEFT  Result Date: 09/04/2021 CLINICAL DATA:  Status post left hip replacement. EXAM: DG HIP (WITH OR WITHOUT PELVIS) 1V*L* COMPARISON:  08/24/2017 FINDINGS: Three frontal C-arm images of the left hip demonstrate an interval left total hip prosthesis in satisfactory position and alignment. The previously demonstrated fixation hardware has been removed. No fracture or dislocation seen. IMPRESSION: Satisfactory postoperative appearance of a left total hip  prosthesis. Electronically Signed   By: Claudie Revering M.D.   On: 09/04/2021 14:12    Assessment/Plan: 2 Days Post-Op   Principal Problem:   History of total hip replacement, left   Advance diet Up with therapy Discharge to SNF when bed available   [name] , PA-C 09/06/2021, 10:19 AM

## 2021-09-06 NOTE — Progress Notes (Signed)
Physical Therapy Treatment Patient Details Name: Kellie Harris MRN: [id number] DOB: March 03, 1931 Today's Date: 09/06/2021   History of Present Illness Pt is an 88 yo F diagnosed with left hip traumatic AVN with painful hardware and is s/p L THR with removal of intramedullary nail. PMH includes DDD, diverticulosis, htn, HDL, osteopenia, pancreatic insufficiency, h/o previous CVA's (without residual deficits), R knee fx, s/p ORIF radius and ulnar fx 2019, seizures, R TKR, L hip fx, L IMN intertrochanteric 2018, previous episodes of vomiting, and aphasia.    PT Comments    Pt was pleasant and motivated to participate during the session and put forth good effort throughout. Pt continued to require physical assistance with bed mobility tasks and heavy cuing during ambulation for proper sequencing to ensure LLE WB compliance.  Pt reported only 2/10 L hip pain during the session that did not worsen with WB activities. Pt's SpO2 and HR WNL on room air with no adverse symptoms reported other than the mild hip pain.  Pt will benefit from PT services in a SNF setting upon discharge to safely address deficits listed in patient problem list for decreased caregiver assistance and eventual return to PLOF.     Recommendations for follow up therapy are one component of a multi-disciplinary discharge planning process, led by the attending physician.  Recommendations may be updated based on patient status, additional functional criteria and insurance authorization.  Follow Up Recommendations  Skilled nursing-short term rehab (<3 hours/day)     Assistance Recommended at Discharge Frequent or constant Supervision/Assistance  Patient can return home with the following A lot of help with walking and/or transfers;A lot of help with bathing/dressing/bathroom;Assistance with cooking/housework;Direct supervision/assist for medications management;Help with stairs or ramp for entrance;Assist for transportation   Equipment  Recommendations  Other (comment) (TBD at next venue of care)    Recommendations for Other Services       Precautions / Restrictions Precautions Precautions: Fall;Anterior Hip Precaution Booklet Issued: Yes (comment) Restrictions Weight Bearing Restrictions: Yes LLE Weight Bearing: Partial weight bearing LLE Partial Weight Bearing Percentage or Pounds: 50% Other Position/Activity Restrictions: Per Dr. Harlow Mares ok for limited ambulation at EOB such as bed to chair/BSC even if pt unable to maintain PWB status     Mobility  Bed Mobility Overal bed mobility: Needs Assistance Bed Mobility: Sit to Supine           General bed mobility comments: Min A for LLE management during sup to sit    Transfers Overall transfer level: Needs assistance Equipment used: Rolling walker (2 wheels) Transfers: Sit to/from Stand Sit to Stand: Min guard           General transfer comment: Min verbal cues for sequencing    Ambulation/Gait Ambulation/Gait assistance: Min guard Gait Distance (Feet): 5 Feet Assistive device: Rolling walker (2 wheels) Gait Pattern/deviations: Step-to pattern, Decreased stance time - left, Antalgic Gait velocity: decreased     General Gait Details: Mod to max multi-modal cueing for proper sequencing for WB compliance   Stairs             Wheelchair Mobility    Modified Rankin (Stroke Patients Only)       Balance Overall balance assessment: Needs assistance Sitting-balance support: Bilateral upper extremity supported, Feet unsupported Sitting balance-Leahy Scale: Good     Standing balance support: Bilateral upper extremity supported, During functional activity Standing balance-Leahy Scale: Fair  Cognition Arousal/Alertness: Awake/alert Behavior During Therapy: WFL for tasks assessed/performed Overall Cognitive Status: Within Functional Limits for tasks assessed                                           Exercises Total Joint Exercises Ankle Circles/Pumps: AROM, Strengthening, Both, 10 reps Long Arc Quad: AROM, Strengthening, Both, 10 reps (gentle manual resistance) Knee Flexion: AROM, Strengthening, Both, 10 reps (gentle manual resistance) Marching in Standing: AROM, Strengthening, Left, 5 reps    General Comments        Pertinent Vitals/Pain Pain Assessment Pain Assessment: 0-10 Pain Score: 2  Pain Location: L hip Pain Descriptors / Indicators: Sore Pain Intervention(s): Repositioned, Premedicated before session, Monitored during session, Ice applied    Home Living                          Prior Function            PT Goals (current goals can now be found in the care plan section) Progress towards PT goals: Progressing toward goals    Frequency    BID      PT Plan Current plan remains appropriate    Co-evaluation              AM-PAC PT "6 Clicks" Mobility   Outcome Measure  Help needed turning from your back to your side while in a flat bed without using bedrails?: A Little Help needed moving from lying on your back to sitting on the side of a flat bed without using bedrails?: A Little Help needed moving to and from a bed to a chair (including a wheelchair)?: A Little Help needed standing up from a chair using your arms (e.g., wheelchair or bedside chair)?: A Little Help needed to walk in hospital room?: A Lot Help needed climbing 3-5 steps with a railing? : Total 6 Click Score: 15    End of Session Equipment Utilized During Treatment: Gait belt Activity Tolerance: Patient tolerated treatment well Patient left: in chair;with call bell/phone within reach;with SCD's reapplied;Other (comment) (Ice bag to L hip)   PT Visit Diagnosis: Other abnormalities of gait and mobility (R26.89);Muscle weakness (generalized) (M62.81);Pain Pain - Right/Left: Left Pain - part of body: Hip     Time: 6256-3893 PT Time Calculation  (min) (ACUTE ONLY): 25 min  Charges:  $Gait Training: 8-22 mins $Therapeutic Exercise: 8-22 mins                     D. Scott Marielys Trinidad PT, DPT 09/06/21, 10:45 AM

## 2021-09-06 NOTE — Discharge Instructions (Signed)

## 2021-09-06 NOTE — Discharge Summary (Signed)
Physician Discharge Summary  Patient ID: Kellie Harris MRN: [id number] DOB/AGE: [birth year] 86 y.o.  Admit date: 09/04/2021 Discharge date: 09/06/2021  Admission Diagnoses:  M16.12 Unilateral primary osteoarthritis, left hip History of total hip replacement, left  Discharge Diagnoses:  M16.12 Unilateral primary osteoarthritis, left hip Principal Problem:   History of total hip replacement, left   Past Medical History:  Diagnosis Date   Arthritis    right knee, left shoulder/ OSTEO   Cancer ([place])    skin    Degenerative disc disease, lumbar    L4-L5   Diverticulosis    ITIS   History of hiatal hernia    Hydronephrosis    right   Hypercholesteremia    Hypertension    CONTROLLED ON MEDS   IBS (irritable bowel syndrome)    Knee fracture, right 03/05/2015   DIFFICULTY WITH AMBULATION   Osteopenia    Ovarian cyst    Pancreatic insufficiency    Stroke [facility])    Wears hearing aid    bilateral    Surgeries: Procedure(s): TOTAL HIP ARTHROPLASTY ANTERIOR APPROACH HARDWARE REMOVAL on 09/04/2021   Consultants (if any):   Discharged Condition: Improved  Hospital Course: Kellie Harris is an 88 y.o. female who was admitted 09/04/2021 with a diagnosis of  M16.12 Unilateral primary osteoarthritis, left hip History of total hip replacement, left and went to the operating room on 09/04/2021 and underwent the above named procedures.    She was given perioperative antibiotics:  Anti-infectives (From admission, onward)    Start     Dose/Rate Route Frequency Ordered Stop   09/04/21 1718  ceFAZolin (ANCEF) 2-4 GM/100ML-% IVPB       Note to Pharmacy: Maryagnes Amos B: cabinet override      09/04/21 1718 09/04/21 1725   09/04/21 1630  ceFAZolin (ANCEF) IVPB 2g/100 mL premix        2 g 200 mL/hr over 30 Minutes Intravenous Every 6 hours 09/04/21 1520 09/05/21 0014   09/04/21 0903  ceFAZolin (ANCEF) 2-4 GM/100ML-% IVPB       Note to Pharmacy: Herby Abraham W: cabinet override       09/04/21 0903 09/04/21 1755   09/04/21 0600  ceFAZolin (ANCEF) IVPB 2g/100 mL premix        2 g 200 mL/hr over 30 Minutes Intravenous On call to O.R. 09/04/21 0153 09/04/21 1026     .  She was given sequential compression devices, early ambulation, and aspirin for DVT prophylaxis.  She benefited maximally from the hospital stay and there were no complications.    Recent vital signs:  Vitals:   09/06/21 0400 09/06/21 0730  BP: (!) 145/77 (!) 157/82  Pulse: 90 79  Resp: 16 16  Temp: 98 F (36.7 C) 98.8 F (37.1 C)  SpO2: 97% 98%    Recent laboratory studies:  Lab Results  Component Value Date   HGB 8.5 (L) 09/06/2021   HGB 9.5 (L) 09/05/2021   HGB 13.7 08/23/2021   Lab Results  Component Value Date   WBC 12.8 (H) 09/06/2021   PLT 128 (L) 09/06/2021   Lab Results  Component Value Date   INR 1.0 08/23/2021   Lab Results  Component Value Date   NA 138 09/05/2021   K 3.9 09/05/2021   CL 103 09/05/2021   CO2 26 09/05/2021   BUN 14 09/05/2021   CREATININE 0.70 09/05/2021   GLUCOSE 129 (H) 09/05/2021    Discharge Medications:   Allergies as of 09/06/2021  No Known Allergies      Medication List     TAKE these medications    acetaminophen 500 MG tablet Commonly known as: TYLENOL Take 2 tablets by mouth every 8 (eight) hours as needed for mild pain.   amLODipine 5 MG tablet Commonly known as: NORVASC Take 5 mg by mouth daily.   aspirin 81 MG chewable tablet Chew 1 tablet (81 mg total) by mouth 2 (two) times daily. What changed: when to take this   celecoxib 200 MG capsule Commonly known as: CELEBREX Take 200 mg by mouth daily as needed for mild pain.   docusate sodium 100 MG capsule Commonly known as: COLACE Take 1 capsule (100 mg total) by mouth 2 (two) times daily.   HYDROcodone-acetaminophen 5-325 MG tablet Commonly known as: NORCO/VICODIN Take 1 tablet by mouth every 4 (four) hours as needed for moderate pain (pain score 4-6).    lisinopril 20 MG tablet Commonly known as: ZESTRIL Take 1 tablet by mouth 2 (two) times daily.   metoprolol succinate 25 MG 24 hr tablet Commonly known as: TOPROL-XL Take 12.5 mg by mouth daily.   multivitamin capsule Take 1 capsule by mouth daily.   pravastatin 40 MG tablet Commonly known as: PRAVACHOL Take 40 mg by mouth at bedtime.   Vitamin D 50 MCG (2000 UT) tablet Take 2,000 Units by mouth daily.               Durable Medical Equipment  (From admission, onward)           Start     Ordered   09/06/21 1027  For home use only DME 3 n 1  Once        09/06/21 1026   09/06/21 1027  For home use only DME Walker rolling  Once       Question Answer Comment  Walker: With 5 Inch Wheels   Patient needs a walker to treat with the following condition Osteoarthritis of left hip      09/06/21 1026   09/04/21 1521  DME Walker rolling  Once       Question:  Patient needs a walker to treat with the following condition  Answer:  History of total hip replacement, left   09/04/21 1520   09/04/21 1521  DME 3 n 1  Once        09/04/21 1520   09/04/21 1521  DME Bedside commode  Once       Question:  Patient needs a bedside commode to treat with the following condition  Answer:  History of total hip replacement, left   09/04/21 1520            Diagnostic Studies: DG C-Arm 1-60 Min-No Report  Result Date: 09/04/2021 Fluoroscopy was utilized by the requesting physician.  No radiographic interpretation.   DG C-Arm 1-60 Min-No Report  Result Date: 09/04/2021 Fluoroscopy was utilized by the requesting physician.  No radiographic interpretation.   DG HIP UNILAT WITH PELVIS 1V LEFT  Result Date: 09/04/2021 CLINICAL DATA:  Status post left hip replacement. EXAM: DG HIP (WITH OR WITHOUT PELVIS) 1V*L* COMPARISON:  08/24/2017 FINDINGS: Three frontal C-arm images of the left hip demonstrate an interval left total hip prosthesis in satisfactory position and alignment. The  previously demonstrated fixation hardware has been removed. No fracture or dislocation seen. IMPRESSION: Satisfactory postoperative appearance of a left total hip prosthesis. Electronically Signed   By: Claudie Revering M.D.   On: 09/04/2021 14:12  Disposition: Discharge disposition: 03-Skilled Nursing Facility          Contact information for after-discharge care     Destination     [facility] Preferred SNF .   Service: Skilled Nursing Contact information: [address] [place] [phone]                      Signed: [name] ,PA-C 09/06/2021, 10:27 AM

## 2021-09-07 ENCOUNTER — Observation Stay
Admission: EM | Admit: 2021-09-07 | Discharge: 2021-09-10 | Disposition: A | Discharge status: Skilled Nursing Facility | Payer: PPO | Attending: Internal Medicine | Admitting: Internal Medicine

## 2021-09-07 ENCOUNTER — Observation Stay: Payer: PPO

## 2021-09-07 ENCOUNTER — Other Ambulatory Visit: Payer: Self-pay

## 2021-09-07 ENCOUNTER — Emergency Department: Payer: PPO

## 2021-09-07 DIAGNOSIS — Z96651 Presence of right artificial knee joint: Secondary | ICD-10-CM | POA: Diagnosis not present

## 2021-09-07 DIAGNOSIS — X58XXXD Exposure to other specified factors, subsequent encounter: Secondary | ICD-10-CM | POA: Diagnosis not present

## 2021-09-07 DIAGNOSIS — Z8673 Personal history of transient ischemic attack (TIA), and cerebral infarction without residual deficits: Secondary | ICD-10-CM | POA: Diagnosis not present

## 2021-09-07 DIAGNOSIS — I10 Essential (primary) hypertension: Secondary | ICD-10-CM | POA: Diagnosis not present

## 2021-09-07 DIAGNOSIS — M6281 Muscle weakness (generalized): Secondary | ICD-10-CM | POA: Diagnosis not present

## 2021-09-07 DIAGNOSIS — I130 Hypertensive heart and chronic kidney disease with heart failure and stage 1 through stage 4 chronic kidney disease, or unspecified chronic kidney disease: Secondary | ICD-10-CM | POA: Diagnosis not present

## 2021-09-07 DIAGNOSIS — M25559 Pain in unspecified hip: Secondary | ICD-10-CM

## 2021-09-07 DIAGNOSIS — M7122 Synovial cyst of popliteal space [Baker], left knee: Secondary | ICD-10-CM | POA: Diagnosis not present

## 2021-09-07 DIAGNOSIS — E871 Hypo-osmolality and hyponatremia: Secondary | ICD-10-CM

## 2021-09-07 DIAGNOSIS — R000 Tachycardia, unspecified: Secondary | ICD-10-CM | POA: Diagnosis not present

## 2021-09-07 DIAGNOSIS — R54 Age-related physical debility: Secondary | ICD-10-CM | POA: Diagnosis not present

## 2021-09-07 DIAGNOSIS — S72002D Fracture of unspecified part of neck of left femur, subsequent encounter for closed fracture with routine healing: Secondary | ICD-10-CM | POA: Insufficient documentation

## 2021-09-07 DIAGNOSIS — R55 Syncope and collapse: Secondary | ICD-10-CM | POA: Diagnosis not present

## 2021-09-07 DIAGNOSIS — Z7982 Long term (current) use of aspirin: Secondary | ICD-10-CM | POA: Insufficient documentation

## 2021-09-07 DIAGNOSIS — D72829 Elevated white blood cell count, unspecified: Secondary | ICD-10-CM | POA: Diagnosis not present

## 2021-09-07 DIAGNOSIS — J9600 Acute respiratory failure, unspecified whether with hypoxia or hypercapnia: Secondary | ICD-10-CM | POA: Diagnosis not present

## 2021-09-07 DIAGNOSIS — E785 Hyperlipidemia, unspecified: Secondary | ICD-10-CM | POA: Diagnosis not present

## 2021-09-07 DIAGNOSIS — Z20822 Contact with and (suspected) exposure to covid-19: Secondary | ICD-10-CM | POA: Insufficient documentation

## 2021-09-07 DIAGNOSIS — M159 Polyosteoarthritis, unspecified: Secondary | ICD-10-CM | POA: Diagnosis not present

## 2021-09-07 DIAGNOSIS — I6381 Other cerebral infarction due to occlusion or stenosis of small artery: Secondary | ICD-10-CM | POA: Diagnosis not present

## 2021-09-07 DIAGNOSIS — R531 Weakness: Secondary | ICD-10-CM | POA: Diagnosis not present

## 2021-09-07 DIAGNOSIS — E876 Hypokalemia: Secondary | ICD-10-CM | POA: Diagnosis not present

## 2021-09-07 DIAGNOSIS — Z79899 Other long term (current) drug therapy: Secondary | ICD-10-CM | POA: Diagnosis not present

## 2021-09-07 DIAGNOSIS — Z96642 Presence of left artificial hip joint: Secondary | ICD-10-CM | POA: Diagnosis not present

## 2021-09-07 DIAGNOSIS — I69392 Facial weakness following cerebral infarction: Secondary | ICD-10-CM | POA: Diagnosis not present

## 2021-09-07 DIAGNOSIS — Z85828 Personal history of other malignant neoplasm of skin: Secondary | ICD-10-CM | POA: Insufficient documentation

## 2021-09-07 DIAGNOSIS — D62 Acute posthemorrhagic anemia: Secondary | ICD-10-CM | POA: Diagnosis not present

## 2021-09-07 DIAGNOSIS — I69320 Aphasia following cerebral infarction: Secondary | ICD-10-CM | POA: Diagnosis not present

## 2021-09-07 DIAGNOSIS — R0902 Hypoxemia: Secondary | ICD-10-CM

## 2021-09-07 DIAGNOSIS — S72002A Fracture of unspecified part of neck of left femur, initial encounter for closed fracture: Secondary | ICD-10-CM | POA: Diagnosis present

## 2021-09-07 DIAGNOSIS — R4781 Slurred speech: Secondary | ICD-10-CM | POA: Diagnosis not present

## 2021-09-07 DIAGNOSIS — T8189XA Other complications of procedures, not elsewhere classified, initial encounter: Secondary | ICD-10-CM

## 2021-09-07 DIAGNOSIS — R52 Pain, unspecified: Secondary | ICD-10-CM | POA: Diagnosis not present

## 2021-09-07 DIAGNOSIS — I639 Cerebral infarction, unspecified: Secondary | ICD-10-CM | POA: Diagnosis not present

## 2021-09-07 DIAGNOSIS — R001 Bradycardia, unspecified: Secondary | ICD-10-CM | POA: Diagnosis not present

## 2021-09-07 DIAGNOSIS — R4020 Unspecified coma: Secondary | ICD-10-CM | POA: Diagnosis not present

## 2021-09-07 LAB — COMPREHENSIVE METABOLIC PANEL
ALT: 11 U/L (ref 0–44)
AST: 29 U/L (ref 15–41)
Albumin: 3.3 g/dL — ABNORMAL LOW (ref 3.5–5.0)
Alkaline Phosphatase: 65 U/L (ref 38–126)
Anion gap: 10 (ref 5–15)
BUN: 13 mg/dL (ref 8–23)
CO2: 27 mmol/L (ref 22–32)
Calcium: 7.9 mg/dL — ABNORMAL LOW (ref 8.9–10.3)
Chloride: 96 mmol/L — ABNORMAL LOW (ref 98–111)
Creatinine, Ser: 0.7 mg/dL (ref 0.44–1.00)
GFR, Estimated: >60 mL/min (ref >60)
Glucose, Bld: 116 mg/dL — ABNORMAL HIGH (ref 70–99)
Potassium: 3 mmol/L — ABNORMAL LOW (ref 3.5–5.1)
Sodium: 133 mmol/L — ABNORMAL LOW (ref 135–145)
Total Bilirubin: 1.4 mg/dL — ABNORMAL HIGH (ref 0.3–1.2)
Total Protein: 6.6 g/dL (ref 6.5–8.1)

## 2021-09-07 LAB — PROTIME-INR
INR: 1.2 (ref 0.8–1.2)
Prothrombin Time: 15.2 seconds (ref 11.4–15.2)

## 2021-09-07 LAB — APTT: aPTT: 39 seconds — ABNORMAL HIGH (ref 24–36)

## 2021-09-07 LAB — CBC
HCT: 29.7 % — ABNORMAL LOW (ref 36.0–46.0)
Hemoglobin: 9.8 g/dL — ABNORMAL LOW (ref 12.0–15.0)
MCH: 30.5 pg (ref 26.0–34.0)
MCHC: 33 g/dL (ref 30.0–36.0)
MCV: 92.5 fL (ref 80.0–100.0)
Platelets: 188 10*3/uL (ref 150–400)
RBC: 3.21 MIL/uL — ABNORMAL LOW (ref 3.87–5.11)
RDW: 14.5 % (ref 11.5–15.5)
WBC: 23.2 10*3/uL — ABNORMAL HIGH (ref 4.0–10.5)
nRBC: 0 % (ref 0.0–0.2)

## 2021-09-07 LAB — URINALYSIS, ROUTINE W REFLEX MICROSCOPIC
Bilirubin Urine: NEGATIVE
Glucose, UA: NEGATIVE mg/dL
Ketones, ur: 15 mg/dL — AB
Leukocytes,Ua: NEGATIVE
Nitrite: NEGATIVE
Protein, ur: 100 mg/dL — AB
Specific Gravity, Urine: 1.02 (ref 1.005–1.030)
pH: 7 (ref 5.0–8.0)

## 2021-09-07 LAB — URINALYSIS, MICROSCOPIC (REFLEX): Squamous Epithelial / HPF: NONE SEEN (ref 0–5)

## 2021-09-07 LAB — MAGNESIUM: Magnesium: 1.8 mg/dL (ref 1.7–2.4)

## 2021-09-07 LAB — D-DIMER, QUANTITATIVE: D-Dimer, Quant: 2.56 ug/mL-FEU — ABNORMAL HIGH (ref 0.00–0.50)

## 2021-09-07 MED ORDER — HEPARIN BOLUS VIA INFUSION
2600.0000 [IU] | Freq: Once | INTRAVENOUS | Status: AC
Start: 2021-09-07 — End: 2021-09-07
  Administered 2021-09-07: 2600 [IU] via INTRAVENOUS
  Filled 2021-09-07: qty 2600

## 2021-09-07 MED ORDER — HEPARIN (PORCINE) 25000 UT/250ML-% IV SOLN
850.0000 [IU]/h | INTRAVENOUS | Status: DC
  Administered 2021-09-07: 700 [IU]/h via INTRAVENOUS
  Filled 2021-09-07: qty 250

## 2021-09-07 MED ORDER — DOCUSATE SODIUM 100 MG PO CAPS
100.0000 mg | ORAL_CAPSULE | Freq: Two times a day (BID) | ORAL | Status: DC
  Administered 2021-09-08: 100 mg via ORAL
  Filled 2021-09-07: qty 1

## 2021-09-07 MED ORDER — MULTIVITAMINS PO CAPS: 1.0000 | ORAL_CAPSULE | Freq: Every day | ORAL | Status: DC

## 2021-09-07 MED ORDER — ADULT MULTIVITAMIN W/MINERALS CH
1.0000 | ORAL_TABLET | Freq: Every day | ORAL | Status: DC
  Administered 2021-09-08 – 2021-09-10 (×3): 1 via ORAL
  Filled 2021-09-07 (×3): qty 1

## 2021-09-07 MED ORDER — VITAMIN D3 25 MCG (1000 UNIT) PO TABS
2000.0000 [IU] | ORAL_TABLET | Freq: Every day | ORAL | Status: DC
  Administered 2021-09-08 – 2021-09-10 (×3): 2000 [IU] via ORAL
  Filled 2021-09-07 (×7): qty 2

## 2021-09-07 MED ORDER — ENOXAPARIN SODIUM 40 MG/0.4ML IJ SOSY: 40.0000 mg | PREFILLED_SYRINGE | INTRAMUSCULAR | Status: DC

## 2021-09-07 MED ORDER — ASPIRIN 81 MG PO CHEW
81.0000 mg | CHEWABLE_TABLET | Freq: Two times a day (BID) | ORAL | Status: DC
  Administered 2021-09-08 – 2021-09-10 (×5): 81 mg via ORAL
  Filled 2021-09-07 (×5): qty 1

## 2021-09-07 MED ORDER — HYDROCODONE-ACETAMINOPHEN 5-325 MG PO TABS: 1.0000 | ORAL_TABLET | ORAL | Status: DC | PRN

## 2021-09-07 MED ORDER — ALBUTEROL SULFATE HFA 108 (90 BASE) MCG/ACT IN AERS: 2.0000 | INHALATION_SPRAY | RESPIRATORY_TRACT | Status: DC | PRN

## 2021-09-07 MED ORDER — METOPROLOL SUCCINATE ER 25 MG PO TB24
12.5000 mg | ORAL_TABLET | Freq: Every day | ORAL | Status: DC
  Administered 2021-09-08 – 2021-09-10 (×3): 12.5 mg via ORAL
  Filled 2021-09-07: qty 0.5
  Filled 2021-09-07 (×3): qty 1

## 2021-09-07 MED ORDER — SODIUM CHLORIDE 0.9 % IV SOLN: INTRAVENOUS | Status: DC

## 2021-09-07 MED ORDER — ONDANSETRON HCL 4 MG/2ML IJ SOLN: 4.0000 mg | Freq: Three times a day (TID) | INTRAMUSCULAR | Status: DC | PRN

## 2021-09-07 MED ORDER — POTASSIUM CHLORIDE CRYS ER 20 MEQ PO TBCR
60.0000 meq | EXTENDED_RELEASE_TABLET | Freq: Once | ORAL | Status: AC
  Administered 2021-09-07: 60 meq via ORAL
  Filled 2021-09-07: qty 3

## 2021-09-07 MED ORDER — SODIUM CHLORIDE 0.9 % IV BOLUS
500.0000 mL | Freq: Once | INTRAVENOUS | Status: AC
  Administered 2021-09-07: 500 mL via INTRAVENOUS

## 2021-09-07 MED ORDER — ACETAMINOPHEN 325 MG PO TABS
650.0000 mg | ORAL_TABLET | Freq: Four times a day (QID) | ORAL | Status: DC | PRN
  Administered 2021-09-07 – 2021-09-09 (×4): 650 mg via ORAL
  Filled 2021-09-07 (×3): qty 2

## 2021-09-07 MED ORDER — LISINOPRIL 20 MG PO TABS
20.0000 mg | ORAL_TABLET | Freq: Two times a day (BID) | ORAL | Status: DC
  Administered 2021-09-08 – 2021-09-10 (×5): 20 mg via ORAL
  Filled 2021-09-07 (×5): qty 1

## 2021-09-07 MED ORDER — PRAVASTATIN SODIUM 20 MG PO TABS
40.0000 mg | ORAL_TABLET | Freq: Every day | ORAL | Status: DC
Start: 2021-09-07 — End: 2021-09-10
  Administered 2021-09-07 – 2021-09-09 (×3): 40 mg via ORAL
  Filled 2021-09-07 (×3): qty 2

## 2021-09-07 MED ORDER — HYDRALAZINE HCL 20 MG/ML IJ SOLN: 5.0000 mg | INTRAMUSCULAR | Status: DC | PRN

## 2021-09-07 MED ORDER — AMLODIPINE BESYLATE 5 MG PO TABS
5.0000 mg | ORAL_TABLET | Freq: Every day | ORAL | Status: DC
  Administered 2021-09-08: 5 mg via ORAL
  Filled 2021-09-07: qty 1

## 2021-09-07 NOTE — Assessment & Plan Note (Signed)
Pravastatin  

## 2021-09-07 NOTE — Assessment & Plan Note (Signed)
S/p of surgery, currently doing rehab - As needed Norco, Tylenol for pain

## 2021-09-07 NOTE — ED Notes (Signed)
Spo2 of pt is inaccurate. Pulse ox off pt finger. Pt o2 sat 95% on RA. NAD, pt breathing normal and non labored.

## 2021-09-07 NOTE — Assessment & Plan Note (Addendum)
Etiology is not clear.  Per report, patient had orthostatic vital sign which may have contributed partially.  Another potential differential diagnosis is pulmonary embolism given recent left hip surgery, tachycardia and documented oxygen desaturation. D-dimer is positive at 2.56. LE-doppler is negative for DVT. Pt refused CAT, but I still highly suspect possibility of PE.  Discussed with her daughter, she agreed to treat the patient empirically with IV heparin.  If patient changes her mind tomorrow, may do CT angiogram then  - Place on tele-med bed for obs - Orthostatic vital signs  - Frequent neurochecks - IVF: 500 cc of NS, then NS 75 cc/h - start IV heparin empirically - EEG

## 2021-09-07 NOTE — Assessment & Plan Note (Signed)
-   IV hydralazine as needed -Amlodipine, lisinopril, metoprolol

## 2021-09-07 NOTE — ED Triage Notes (Signed)
Pt arrives via EMS from [facility] after having a witnessed syncopal episode- pt was going to the bathroom with staff when she passed out- pt is at [facility] for rehab after having L hip surgery here- pt was positive orthostatic for EMS with her HR going from 99 sitting to 62 standing- pt denies any complaints other than her shoulder hurting

## 2021-09-07 NOTE — ED Notes (Signed)
RN aware bed assigned ?

## 2021-09-07 NOTE — Assessment & Plan Note (Signed)
Aspirin, pravastatin 

## 2021-09-07 NOTE — ED Notes (Signed)
This nurse went to patient's bedside to start new INT and give patient her potassium. Patient asked why she needed a new INT and I explained it was for her CTA. Patient states that she has had no chest pain or shob and does not feel she needs a CTA for possible blood clots in her lungs. Will notify the provider.

## 2021-09-07 NOTE — ED Notes (Signed)
RN notified ED charge nurse about not having an assigned nurse to pt on the medsurg floor.

## 2021-09-07 NOTE — Assessment & Plan Note (Signed)
Potassium 3.0  -Repleted potassium -Check magnesium level

## 2021-09-07 NOTE — Assessment & Plan Note (Signed)
No fever, no source of infection identified, likely reactive -Follow-up with CBC

## 2021-09-07 NOTE — ED Provider Notes (Signed)
[facility] Provider Note    Event Date/Time   First MD Initiated Contact with Patient 09/07/21 [phone]     (approximate)   History   Loss of Consciousness   HPI  Ksenia Kunz Treanor is a 88 y.o. female with a history of arthritis, CVA status post left hip fracture who is currently in rehab day 1 who presents after a near syncopal episode.  Patient reports that she was having pain in her shoulders from her chronic arthritis, she got up to get to the bedside commode and apparently had a near syncopal episode.  Reportedly was orthostatic per EMS.  She feels quite well at this time.  No chest pain.  No abdominal pain.  No nausea or vomiting.  No fall reported     Physical Exam   Triage Vital Signs: ED Triage Vitals  Enc Vitals Group     BP 09/07/21 0937 (!) 144/97     Pulse Rate 09/07/21 0937 97     Resp 09/07/21 0937 15     Temp 09/07/21 0937 98.2 F (36.8 C)     Temp Source 09/07/21 0937 Oral     SpO2 09/07/21 0937 95 %     Weight 09/07/21 0936 43.1 kg (95 lb)     Height 09/07/21 0936 1.499 m (4\' 11" )     Head Circumference --      [facility] Flow --      Pain Score 09/07/21 0936 6     Pain Loc --      Pain Edu? --      Excl. in [place]? --     Most recent vital signs: Vitals:   09/07/21 1300 09/07/21 1315  BP: (!) 159/84   Pulse: (!) 148 (!) 106  Resp: (!) 26 16  Temp:    SpO2: (!) 78% 96%     General: Awake, no distress.  Pleasant CV:  Good peripheral perfusion.  Mild tachycardia Resp:  Normal effort.  Abd:  No distention.  No abdominal tenderness to palpation Other:  No vertebral tenderness palpation, no head trauma, no evidence of traumatic injury.   ED Results / Procedures / Treatments   Labs (all labs ordered are listed, but only abnormal results are displayed) Labs Reviewed  CBC - Abnormal; Notable for the following components:      Result Value   WBC 23.2 (*)    RBC 3.21 (*)    Hemoglobin 9.8 (*)    HCT 29.7 (*)    All other components  within normal limits  URINALYSIS, ROUTINE W REFLEX MICROSCOPIC - Abnormal; Notable for the following components:   Hgb urine dipstick TRACE (*)    Ketones, ur 15 (*)    Protein, ur 100 (*)    All other components within normal limits  COMPREHENSIVE METABOLIC PANEL - Abnormal; Notable for the following components:   Sodium 133 (*)    Potassium 3.0 (*)    Chloride 96 (*)    Glucose, Bld 116 (*)    Calcium 7.9 (*)    Albumin 3.3 (*)    Total Bilirubin 1.4 (*)    All other components within normal limits  URINALYSIS, MICROSCOPIC (REFLEX) - Abnormal; Notable for the following components:   Bacteria, UA RARE (*)    All other components within normal limits  RESP PANEL BY RT-PCR (FLU A&B, COVID) [id number]  D-DIMER, QUANTITATIVE  MAGNESIUM     EKG  ED ECG REPORT I, Lavonia Drafts, the attending physician, personally  viewed and interpreted this ECG.  Date: 09/07/2021  Rhythm: Sinus tachycardia QRS Axis: normal Intervals: normal ST/T Wave abnormalities: normal Narrative Interpretation: no evidence of acute ischemia    RADIOLOGY Chest x-ray viewed by me, no acute abnormality    PROCEDURES:  Critical Care performed:   Procedures   MEDICATIONS ORDERED IN ED: Medications  potassium chloride SA (KLOR-CON M) CR tablet 60 mEq (has no administration in time range)  0.9 %  sodium chloride infusion (has no administration in time range)  enoxaparin (LOVENOX) injection 40 mg (has no administration in time range)  sodium chloride 0.9 % bolus 500 mL (500 mLs Intravenous New Bag/Given 09/07/21 1113)     IMPRESSION / MDM / ASSESSMENT AND PLAN / ED COURSE  I reviewed the triage vital signs and the nursing notes.  Patient presents after near syncopal episode as detailed above.  Differential includes vasovagal syncope, dehydration.  No fevers chills or symptoms to suggest infectious etiology.  Recent hip fracture as above, she reports no increased pain  Vital signs are reassuring on  arrival, will give IV fluids  Lab work notable for elevated white blood cell count, CMP demonstrates mild hyponatremia, hypokalemia  Urinalysis is unremarkable  X-ray without evidence of pneumonia.  However the patient remains mildly tachycardic with elevated white blood cell count.  No pleurisy or shortness of breath or chest pain to suggest PE  I have discussed with the hospitalist for admission I have consulted the hospitalist, they have accepted the patient for admission         FINAL CLINICAL IMPRESSION(S) / ED DIAGNOSES   Final diagnoses:  Syncope and collapse     Rx / DC Orders   ED Discharge Orders     None        Note:  This document was prepared using Dragon voice recognition software and may include unintentional dictation errors.   Lavonia Drafts, MD 09/07/21 1343

## 2021-09-07 NOTE — Consult Note (Signed)
ANTICOAGULATION CONSULT NOTE   Pharmacy Consult for heparin Indication: possible pulmonary embolus  No Known Allergies  Patient Measurements: Height: 4\' 11"  (149.9 cm) Weight: 43.1 kg (95 lb) IBW/kg (Calculated) : 43.2 Heparin Dosing Weight: 43.1 kg  Vital Signs: Temp: 98.2 F (36.8 C) (01/26 0937) Temp Source: Oral (01/26 0937) BP: 124/73 (01/26 1600) Pulse Rate: 100 (01/26 1615)  Labs: Recent Labs    09/05/21 0556 09/06/21 0640 09/07/21 0945  HGB 9.5* 8.5* 9.8*  HCT 28.6* 25.4* 29.7*  PLT 163 128* 188  CREATININE 0.70  --  0.70    Estimated Creatinine Clearance: 33.1 mL/min (by C-G formula based on SCr of 0.7 mg/dL).   Medical History: Past Medical History:  Diagnosis Date   Arthritis    right knee, left shoulder/ OSTEO   Cancer ([place])    skin    Degenerative disc disease, lumbar    L4-L5   Diverticulosis    ITIS   History of hiatal hernia    Hydronephrosis    right   Hypercholesteremia    Hypertension    CONTROLLED ON MEDS   IBS (irritable bowel syndrome)    Knee fracture, right 03/05/2015   DIFFICULTY WITH AMBULATION   Osteopenia    Ovarian cyst    Pancreatic insufficiency    Stroke [facility])    Wears hearing aid    bilateral    Medications:  (Not in a hospital admission)  Scheduled:   amLODipine  5 mg Oral Daily   aspirin  81 mg Oral BID   cholecalciferol  2,000 Units Oral Daily   docusate sodium  100 mg Oral BID   lisinopril  20 mg Oral BID   metoprolol succinate  12.5 mg Oral Daily   [START ON 09/08/2021] multivitamin with minerals  1 tablet Oral Daily   pravastatin  40 mg Oral QHS   Infusions:   sodium chloride 75 mL/hr at 09/07/21 1425   PRN: acetaminophen, hydrALAZINE, HYDROcodone-acetaminophen, ondansetron (ZOFRAN) IV Anti-infectives (From admission, onward)    None       Assessment: 88 y.o. female with a history of arthritis, CVA status post left hip fracture who is currently in rehab day 1 who presents after a near syncopal  episode. Pharmacy consulted to start heparin for possible PE. Pt refusing CAT scan. No note of DOAC PTA. CBC stable.   Goal of Therapy:  Heparin level 0.3-0.7 units/ml Monitor platelets by anticoagulation protocol: Yes   Plan:  Give 2600 units bolus x 1 Start heparin infusion at 700 units/hr Check anti-Xa level in 8 hours and daily while on heparin Continue to monitor H&H and platelets  Oswald Hillock, PharmD, BCPS 09/07/2021,6:22 PM

## 2021-09-07 NOTE — H&P (Signed)
History and Physical    Patient: Kellie Harris:[id number] DOB: [birth year] DOA: 09/07/2021 DOS: the patient was seen and examined on 09/07/2021 PCP: Dion Body, MD  Patient coming from: SNF rehab  Chief Complaint: syncope  HPI: Kellie Harris is a 88 y.o. female with medical history significant of hypertension, hyperlipidemia, stroke, IBS, skin cancer, diverticulosis, pancreatic insufficiency, hard of hearing, who presents with syncope.  Patient was recently hospitalized from 1/20 to 1/25 due to left hip fracture.  Patient is s/p of left hip surgery.  Currently patient is doing rehab.  Per report, patient had syncopal episode when she was going to the bathroom in facility. No facial droop or slurred speech.  Denies unilateral numbness or tinglings in extremities.  Patient denies chest pain, cough, shortness of breath.  No fever or chills.  Denies nausea, vomiting, diarrhea or abdominal pain.  No symptoms of UTI. Patient states that she has arthritis in both shoulders. She has chronic bilateral shoulder pain now.  She does not have significant pain in the surgical site of her left hip. Pt had positive orthostatic vitals per EMS, her HR increased from 62 to 99 from sitting to standing position.  Data review and ED course: I have personally reviewed labs and imaging studies.  WBC 23.2, negative urinalysis, pending COVID PCR (patient had a negative COVID PCR yesterday), renal function okay, potassium 3.0, temperature normal, blood pressure 159/84, heart rate 148 --> 110s, RR 29 --> 16.  Current oxygen saturation is 96% on room air, but the patient had 2 documented oxygen desaturation 48% and 78% on room air in ED.  Chest x-ray showed hyperinflation without infiltration.  CT head negative for acute intracranial abnormalities.  D-dimer +2.56, lower extremity Doppler negative for DVT.  Patient is placed on telemetry bed for observation.  EKG: Reviewed KEG independently.  Sinus rhythm, bifascicular  block, early R wave progression, PAC  Review of Systems:   General: no fevers, chills, no body weight gain, fatigue HEENT: no blurry vision, hearing changes or sore throat Respiratory: no dyspnea, coughing, wheezing CV: no chest pain, no palpitations GI: no nausea, vomiting, abdominal pain, diarrhea, constipation GU: no dysuria, burning on urination, increased urinary frequency, hematuria  Ext: no leg edema Neuro: no unilateral weakness, numbness, or tingling, no vision change or hearing loss. Has syncope Skin: no rash, no skin tear. MSK: No muscle spasm, no deformity, no limitation of range of movement in spin. S/p of left hip surgery.  Has bilateral shoulder pain Heme: No easy bruising.  Travel history: No recent long distant travel.    Past Medical History:  Diagnosis Date   Arthritis    right knee, left shoulder/ OSTEO   Cancer ([place])    skin    Degenerative disc disease, lumbar    L4-L5   Diverticulosis    ITIS   History of hiatal hernia    Hydronephrosis    right   Hypercholesteremia    Hypertension    CONTROLLED ON MEDS   IBS (irritable bowel syndrome)    Knee fracture, right 03/05/2015   DIFFICULTY WITH AMBULATION   Osteopenia    Ovarian cyst    Pancreatic insufficiency    Stroke [facility])    Wears hearing aid    bilateral   Past Surgical History:  Procedure Laterality Date   CATARACT EXTRACTION W/PHACO Right 11/02/2015   Procedure: CATARACT EXTRACTION PHACO AND INTRAOCULAR LENS PLACEMENT ([place]);  Surgeon: Leandrew Koyanagi, MD;  Location: [place];  Service: Ophthalmology;  Laterality: Right;   CATARACT EXTRACTION W/PHACO Left 07/18/2016   Procedure: CATARACT EXTRACTION PHACO AND INTRAOCULAR LENS PLACEMENT ([facility]);  Surgeon: Leandrew Koyanagi, MD;  Location: [place];  Service: Ophthalmology;  Laterality: Left;   COLONOSCOPY     FRACTURE SURGERY Left    hip   history of left hip fracture     INTRAMEDULLARY (IM) NAIL INTERTROCHANTERIC  Left 02/12/2017   Procedure: INTRAMEDULLARY (IM) NAIL INTERTROCHANTRIC;  Surgeon: Earnestine Leys, MD;  Location: [facility];  Service: Orthopedics;  Laterality: Left;   kidney stent     OOPHORECTOMY Bilateral 2015   ORIF WRIST FRACTURE Right 04/08/2018   Procedure: OPEN REDUCTION INTERNAL FIXATION (ORIF) WRIST FRACTURE;  Surgeon: Altamese [place], MD;  Location: [place];  Service: Orthopedics;  Laterality: Right;   TONSILLECTOMY     TOTAL KNEE ARTHROPLASTY Right 08/07/2017   Procedure: TOTAL KNEE ARTHROPLASTY;  Surgeon: Earnestine Leys, MD;  Location: [facility];  Service: Orthopedics;  Laterality: Right;   Social History:  reports that she has never smoked. She has never used smokeless tobacco. She reports that she does not drink alcohol and does not use drugs.  No Known Allergies  Family History  Problem Relation Age of Onset   Breast cancer Mother 59   Leukemia Mother    Stroke Father     Prior to Admission medications   Medication Sig Start Date End Date Taking? Authorizing Provider  acetaminophen (TYLENOL) 500 MG tablet Take 2 tablets by mouth every 8 (eight) hours as needed for mild pain.    [provider]  amLODipine (NORVASC) 5 MG tablet Take 5 mg by mouth daily. 03/13/21   [provider]  aspirin 81 MG chewable tablet Chew 1 tablet (81 mg total) by mouth 2 (two) times daily. 09/06/21   [name], PA-C  celecoxib (CELEBREX) 200 MG capsule Take 200 mg by mouth daily as needed for mild pain.    [provider]  Cholecalciferol (VITAMIN D) 50 MCG (2000 UT) tablet Take 2,000 Units by mouth daily.    [provider]  docusate sodium (COLACE) 100 MG capsule Take 1 capsule (100 mg total) by mouth 2 (two) times daily. 09/06/21   [name], PA-C  HYDROcodone-acetaminophen (NORCO/VICODIN) 5-325 MG tablet Take 1 tablet by mouth every 4 (four) hours as needed for moderate pain (pain score 4-6). 09/06/21   [name], PA-C  lisinopril (ZESTRIL) 20 MG tablet  Take 1 tablet by mouth 2 (two) times daily. 05/02/21   [provider]  metoprolol succinate (TOPROL-XL) 25 MG 24 hr tablet Take 12.5 mg by mouth daily. 03/02/21   [provider]  Multiple Vitamin (MULTIVITAMIN) capsule Take 1 capsule by mouth daily.    [provider]  pravastatin (PRAVACHOL) 40 MG tablet Take 40 mg by mouth at bedtime. 06/05/21   [provider]    Physical Exam: Vitals:   09/07/21 1530 09/07/21 1545 09/07/21 1600 09/07/21 1615  BP: 136/79  124/73   Pulse: (!) 101 99 98 100  Resp: 20 20 (!) 25 (!) 22  Temp:      TempSrc:      SpO2: 95% 94% 95% 97%  Weight:      Height:       Physical exam:  General: Not in acute distress HEENT:       Eyes: PERRL, EOMI, no scleral icterus.       ENT: No discharge from the ears and nose, no pharynx injection, no tonsillar enlargement.  Neck: No JVD, no bruit, no mass felt. Heme: No neck lymph node enlargement. Cardiac: S1/S2, RRR, No murmurs, No gallops or rubs. Respiratory: No rales, wheezing, rhonchi or rubs. GI: Soft, nondistended, nontender, no rebound pain, no organomegaly, BS present. GU: No hematuria Ext: No pitting leg edema bilaterally. 1+DP/PT pulse bilaterally. Musculoskeletal: No joint deformities, No joint redness or warmth, no limitation of ROM in spin. S/p of left hip surgery Skin: No rashes.  Neuro: Alert, oriented X3, cranial nerves II-XII grossly intact, moves all extremities normally.  Psych: Patient is not psychotic, no suicidal or hemocidal ideation.     Assessment/Plan * Syncope- (present on admission) Etiology is not clear.  Per report, patient had orthostatic vital sign which may have contributed partially.  Another potential differential diagnosis is pulmonary embolism given recent left hip surgery, tachycardia and documented oxygen desaturation. D-dimer is positive at 2.56. LE-doppler is negative for DVT. Pt refused CAT, but I still highly suspect possibility  of PE.  Discussed with her daughter, she agreed to treat the patient empirically with IV heparin.  If patient changes her mind tomorrow, may do CT angiogram then  - Place on tele-med bed for obs - Orthostatic vital signs  - Frequent neurochecks - IVF: 500 cc of NS, then NS 75 cc/h - start IV heparin empirically - EEG     Essential hypertension- (present on admission) - IV hydralazine as needed -Amlodipine, lisinopril, metoprolol  History of CVA (cerebrovascular accident) - Aspirin, pravastatin  HLD (hyperlipidemia)- (present on admission) - Pravastatin  Hypokalemia- (present on admission) Potassium 3.0  -Repleted potassium -Check magnesium level  Leukocytosis- (present on admission) No fever, no source of infection identified, likely reactive -Follow-up with CBC  Closed left hip fracture (HCC)- (present on admission) S/p of surgery, currently doing rehab - As needed Norco, Tylenol for pain    Advance Care Planning:   Code Status: DNR per her daughter  Consults: none  Family Communication: yes, I called her son by phone  DVT PPX: on IV heparin  Severity of Illness: The appropriate patient status for this patient is OBSERVATION. Observation status is judged to be reasonable and necessary in order to provide the required intensity of service to ensure the patient's safety. The patient's presenting symptoms, physical exam findings, and initial radiographic and laboratory data in the context of their medical condition is felt to place them at decreased risk for further clinical deterioration. Furthermore, it is anticipated that the patient will be medically stable for discharge from the hospital within 2 midnights of admission.   Author: Ivor Costa, MD 09/07/2021 7:04 PM  For on call review [email or web address].

## 2021-09-08 ENCOUNTER — Encounter: Payer: Self-pay | Admitting: Orthopedic Surgery

## 2021-09-08 ENCOUNTER — Observation Stay: Payer: PPO

## 2021-09-08 DIAGNOSIS — D62 Acute posthemorrhagic anemia: Secondary | ICD-10-CM

## 2021-09-08 DIAGNOSIS — E876 Hypokalemia: Secondary | ICD-10-CM | POA: Diagnosis not present

## 2021-09-08 DIAGNOSIS — E871 Hypo-osmolality and hyponatremia: Secondary | ICD-10-CM

## 2021-09-08 DIAGNOSIS — I10 Essential (primary) hypertension: Secondary | ICD-10-CM | POA: Diagnosis not present

## 2021-09-08 DIAGNOSIS — M25552 Pain in left hip: Secondary | ICD-10-CM | POA: Diagnosis not present

## 2021-09-08 DIAGNOSIS — R55 Syncope and collapse: Secondary | ICD-10-CM | POA: Diagnosis not present

## 2021-09-08 LAB — CBC
HCT: 22.9 % — ABNORMAL LOW (ref 36.0–46.0)
Hemoglobin: 7.5 g/dL — ABNORMAL LOW (ref 12.0–15.0)
MCH: 30.6 pg (ref 26.0–34.0)
MCHC: 32.8 g/dL (ref 30.0–36.0)
MCV: 93.5 fL (ref 80.0–100.0)
Platelets: 155 10*3/uL (ref 150–400)
RBC: 2.45 MIL/uL — ABNORMAL LOW (ref 3.87–5.11)
RDW: 14.3 % (ref 11.5–15.5)
WBC: 11.8 10*3/uL — ABNORMAL HIGH (ref 4.0–10.5)
nRBC: 0 % (ref 0.0–0.2)

## 2021-09-08 LAB — IRON AND TIBC
Iron: 10 ug/dL — ABNORMAL LOW (ref 28–170)
Saturation Ratios: 7 % — ABNORMAL LOW (ref 10.4–31.8)
TIBC: 150 ug/dL — ABNORMAL LOW (ref 250–450)
UIBC: 140 ug/dL

## 2021-09-08 LAB — VITAMIN B12: Vitamin B-12: 230 pg/mL (ref 180–914)

## 2021-09-08 LAB — BASIC METABOLIC PANEL
Anion gap: 5 (ref 5–15)
BUN: 17 mg/dL (ref 8–23)
CO2: 25 mmol/L (ref 22–32)
Calcium: 7.1 mg/dL — ABNORMAL LOW (ref 8.9–10.3)
Chloride: 102 mmol/L (ref 98–111)
Creatinine, Ser: 0.71 mg/dL (ref 0.44–1.00)
GFR, Estimated: >60 mL/min (ref >60)
Glucose, Bld: 102 mg/dL — ABNORMAL HIGH (ref 70–99)
Potassium: 3.7 mmol/L (ref 3.5–5.1)
Sodium: 132 mmol/L — ABNORMAL LOW (ref 135–145)

## 2021-09-08 LAB — HEPARIN LEVEL (UNFRACTIONATED): Heparin Unfractionated: <0.1 IU/mL — ABNORMAL LOW (ref 0.30–0.70)

## 2021-09-08 LAB — FERRITIN: Ferritin: 200 ng/mL (ref 11–307)

## 2021-09-08 MED ORDER — CYANOCOBALAMIN 1000 MCG/ML IJ SOLN
1000.0000 ug | Freq: Once | INTRAMUSCULAR | Status: AC
  Administered 2021-09-08: 1000 ug via INTRAMUSCULAR
  Filled 2021-09-08: qty 1

## 2021-09-08 MED ORDER — SODIUM CHLORIDE 0.9 % IV SOLN
250.0000 mg | Freq: Once | INTRAVENOUS | Status: AC
  Administered 2021-09-08: 250 mg via INTRAVENOUS
  Filled 2021-09-08: qty 20

## 2021-09-08 MED ORDER — ENOXAPARIN SODIUM 30 MG/0.3ML IJ SOSY
30.0000 mg | PREFILLED_SYRINGE | INTRAMUSCULAR | Status: DC
  Administered 2021-09-09 – 2021-09-10 (×2): 30 mg via SUBCUTANEOUS
  Filled 2021-09-08 (×2): qty 0.3

## 2021-09-08 MED ORDER — OYSTER SHELL CALCIUM/D3 500-5 MG-MCG PO TABS
1.0000 | ORAL_TABLET | Freq: Two times a day (BID) | ORAL | Status: DC
  Administered 2021-09-08 – 2021-09-10 (×5): 1 via ORAL
  Filled 2021-09-08 (×5): qty 1

## 2021-09-08 MED ORDER — ENOXAPARIN SODIUM 40 MG/0.4ML IJ SOSY
40.0000 mg | PREFILLED_SYRINGE | INTRAMUSCULAR | Status: DC
  Administered 2021-09-08: 40 mg via SUBCUTANEOUS
  Filled 2021-09-08: qty 0.4

## 2021-09-08 MED ORDER — HEPARIN BOLUS VIA INFUSION
1300.0000 [IU] | Freq: Once | INTRAVENOUS | Status: AC
  Administered 2021-09-08: 1300 [IU] via INTRAVENOUS
  Filled 2021-09-08: qty 1300

## 2021-09-08 MED ORDER — LOPERAMIDE HCL 2 MG PO CAPS
2.0000 mg | ORAL_CAPSULE | ORAL | Status: DC | PRN
  Administered 2021-09-08 – 2021-09-09 (×2): 2 mg via ORAL
  Filled 2021-09-08 (×2): qty 1

## 2021-09-08 NOTE — Progress Notes (Signed)
PHARMACIST - PHYSICIAN COMMUNICATION  CONCERNING:  Enoxaparin (Lovenox) for DVT Prophylaxis    RECOMMENDATION: Patient was prescribed enoxaprin 40mg  q24 hours for VTE prophylaxis.   Filed Weights   09/07/21 0936  Weight: 43.1 kg (95 lb)    Body mass index is 19.19 kg/m.  Estimated Creatinine Clearance: 33.1 mL/min (by C-G formula based on SCr of 0.71 mg/dL).  Patient is candidate for enoxaparin 30mg  every 24 hours based on CrCl <44ml/min or Weight <45kg  DESCRIPTION: Pharmacy has adjusted enoxaparin dose per [facility] policy.  Patient is now receiving enoxaparin 30 mg every 24 hours    Darrick Penna, PharmD Clinical Pharmacist  09/08/2021 1:15 PM

## 2021-09-08 NOTE — NC FL2 (Signed)
[place] LEVEL OF CARE SCREENING TOOL     IDENTIFICATION  Patient Name: Kellie Harris Birthdate: [birth year] Sex: female Admission Date (Current Location): 09/07/2021  [facility] and [place] Number:  Engineering geologist and Address:  [facility], [address] [place], [place] [address]      Provider Number: [id number]  Attending Physician Name and Address:  Sharen Hones, MD  Relative Name and Phone Number:  [name] [phone]    Current Level of Care: Hospital Recommended Level of Care: [place] Prior Approval Number:    Date Approved/Denied:   PASRR Number: [id number]  Discharge Plan: SNF    Current Diagnoses: Patient Active Problem List   Diagnosis Date Noted   Hyponatremia 09/08/2021   Acute blood loss anemia 09/08/2021   Hypokalemia 09/07/2021   History of total hip replacement, left 09/04/2021   Dizziness 01/28/2021   Aphasia 05/03/2020   S/P ORIF (open reduction internal fixation) fracture radius and ulnar fx 04/08/18 04/10/2018   Distal radius and ulna fracture, right    History of CVA (cerebrovascular accident)    Tachycardia    Leukocytosis    Seizures (HCC)    Stroke-like episode ([place]) s/p IV tPA 04/03/2018   Syncope    TIA (transient ischemic attack) 11/04/2017   Malnutrition of moderate degree 08/26/2017   Total knee replacement status 08/07/2017   Closed left hip fracture ([place]) 02/12/2017   Essential hypertension 02/12/2017   HLD (hyperlipidemia) 02/12/2017    Orientation RESPIRATION BLADDER Height & Weight     Self, Time, Situation, Place  Normal Continent, External catheter Weight: 43.1 kg Height:  4\' 11"  (149.9 cm)  BEHAVIORAL SYMPTOMS/MOOD NEUROLOGICAL BOWEL NUTRITION STATUS      Continent Diet (regular)  AMBULATORY STATUS COMMUNICATION OF NEEDS Skin   Extensive Assist Verbally Normal, Surgical wounds                       Personal Care Assistance Level of Assistance   Bathing, Feeding Bathing Assistance: Limited assistance Feeding assistance: Independent       Functional Limitations Info             SPECIAL CARE FACTORS FREQUENCY  PT (By licensed PT), OT (By licensed OT)     PT Frequency: 5 times per week OT Frequency: 5 times per week            Contractures Contractures Info: Not present    Additional Factors Info  Code Status, Allergies Code Status Info: Full Code Allergies Info: NKDA           Current Medications (09/08/2021):  This is the current hospital active medication list Current Facility-Administered Medications  Medication Dose Route Frequency Provider Last Rate Last Admin   acetaminophen (TYLENOL) tablet 650 mg  650 mg Oral Q6H PRN Ivor Costa, MD   650 mg at 09/08/21 1144   aspirin chewable tablet 81 mg  81 mg Oral BID Ivor Costa, MD   81 mg at 09/08/21 1003   calcium-vitamin D (OSCAL WITH D) 500-5 MG-MCG per tablet 1 tablet  1 tablet Oral BID Sharen Hones, MD       cholecalciferol (VITAMIN D) tablet 2,000 Units  2,000 Units Oral Daily Ivor Costa, MD   2,000 Units at 09/08/21 1003   docusate sodium (COLACE) capsule 100 mg  100 mg Oral BID Ivor Costa, MD   100 mg at 09/08/21 1002   enoxaparin (LOVENOX) injection 40 mg  40 mg  Subcutaneous Q24H Sharen Hones, MD   40 mg at 09/08/21 1143   ferric gluconate (FERRLECIT) 250 mg in sodium chloride 0.9 % 250 mL IVPB  250 mg Intravenous Once Sharen Hones, MD 135 mL/hr at 09/08/21 1221 250 mg at 09/08/21 1221   hydrALAZINE (APRESOLINE) injection 5 mg  5 mg Intravenous Q2H PRN Ivor Costa, MD       HYDROcodone-acetaminophen (NORCO/VICODIN) 5-325 MG per tablet 1 tablet  1 tablet Oral Q4H PRN Ivor Costa, MD       lisinopril (ZESTRIL) tablet 20 mg  20 mg Oral BID Ivor Costa, MD   20 mg at 09/08/21 1003   metoprolol succinate (TOPROL-XL) 24 hr tablet 12.5 mg  12.5 mg Oral Daily Ivor Costa, MD   12.5 mg at 09/08/21 1003   multivitamin with minerals tablet 1 tablet  1 tablet Oral Daily  Ivor Costa, MD   1 tablet at 09/08/21 1003   ondansetron (ZOFRAN) injection 4 mg  4 mg Intravenous Q8H PRN Ivor Costa, MD       pravastatin (PRAVACHOL) tablet 40 mg  40 mg Oral QHS Ivor Costa, MD   40 mg at 09/07/21 2142     Discharge Medications: Please see discharge summary for a list of discharge medications.  Relevant Imaging Results:  Relevant Lab Results:   Additional Information SS#: [id number]  Conception Oms, RN

## 2021-09-08 NOTE — TOC Initial Note (Signed)
Transition of Care [facility]) - Initial/Assessment Note    Patient Details  Name: Kellie Harris MRN: [id number] Date of Birth: [birth year]  [facility] CM/SW Contact:    Candie Chroman, LCSW Phone Number: 09/08/2021, 9:53 AM  Clinical Narrative:  CSW met with patient. No supports at bedside. CSW introduced role and explained that discharge planning would be discussed. Patient discharge to [facility] for rehab on 1/25 and was readmitted 1/26. She confirmed plan to return at discharge. MD has entered PT/OT consults. Will start insurance authorization once evaluations are in. SNF admissions coordinator confirmed they can accept her over the weekend if needed. No further concerns. CSW encouraged patient to contact CSW as needed. CSW will continue to follow patient for support and facilitate return to SNF once medically stable.                Expected Discharge Plan: Skilled Nursing Facility Barriers to Discharge: Continued Medical Work up   Patient Goals and CMS Choice     Choice offered to / list presented to : NA  Expected Discharge Plan and Services Expected Discharge Plan: [place] Acute Care Choice: [place] Living arrangements for the past 2 months: Single Family Home                                      Prior Living Arrangements/Services Living arrangements for the past 2 months: Single Family Home Lives with:: Self Patient language and need for interpreter reviewed:: Yes Do you feel safe going back to the place where you live?: Yes      Need for Family Participation in Patient Care: Yes (Comment) Care giver support system in place?: Yes (comment)   Criminal Activity/Legal Involvement Pertinent to Current Situation/Hospitalization: No - Comment as needed  Activities of Daily Living Home Assistive Devices/Equipment: Environmental consultant (specify type), Hearing aid, Shower chair with back ADL Screening (condition at time of  admission) Patient's cognitive ability adequate to safely complete daily activities?: Yes Is the patient deaf or have difficulty hearing?: Yes Does the patient have difficulty seeing, even when wearing glasses/contacts?: No Does the patient have difficulty concentrating, remembering, or making decisions?: No Patient able to express need for assistance with ADLs?: Yes Does the patient have difficulty dressing or bathing?: No Independently performs ADLs?: No Communication: Independent Dressing (OT): Needs assistance Is this a change from baseline?: Pre-admission baseline Grooming: Independent Feeding: Independent Bathing: Needs assistance Is this a change from baseline?: Pre-admission baseline Toileting: Needs assistance Is this a change from baseline?: Pre-admission baseline In/Out Bed: Needs assistance Is this a change from baseline?: Pre-admission baseline Walks in Home: Needs assistance Is this a change from baseline?: Pre-admission baseline Does the patient have difficulty walking or climbing stairs?: Yes Weakness of Legs: Both Weakness of Arms/Hands: None  Permission Sought/Granted Permission sought to share information with : Facility Art therapist granted to share information with : Yes, Verbal Permission Granted     Permission granted to share info w AGENCY: [facility]        Emotional Assessment Appearance:: Appears stated age Attitude/Demeanor/Rapport: Engaged, Gracious Affect (typically observed): Accepting, Appropriate, Calm, Pleasant Orientation: : Oriented to Self, Oriented to Place, Oriented to  Time, Oriented to Situation Alcohol / Substance Use: Not Applicable Psych Involvement: No (comment)  Admission diagnosis:  Syncope and collapse [R55] Syncope [R55] Oxygen desaturation [  R09.02] Near syncope [R55] Postoperative pulmonary thromboembolism ([place]) [T81.89XA, I26.99] Patient Active Problem List   Diagnosis Date Noted    Hyponatremia 09/08/2021   Hypokalemia 09/07/2021   History of total hip replacement, left 09/04/2021   Dizziness 01/28/2021   Aphasia 05/03/2020   S/P ORIF (open reduction internal fixation) fracture radius and ulnar fx 04/08/18 04/10/2018   Distal radius and ulna fracture, right    History of CVA (cerebrovascular accident)    Tachycardia    Leukocytosis    Seizures (HCC)    Stroke-like episode ([place]) s/p IV tPA 04/03/2018   Syncope    TIA (transient ischemic attack) 11/04/2017   Malnutrition of moderate degree 08/26/2017   Total knee replacement status 08/07/2017   Closed left hip fracture ([place]) 02/12/2017   Essential hypertension 02/12/2017   HLD (hyperlipidemia) 02/12/2017   PCP:  Dion Body, MD Pharmacy:   [place], [place] [facility] [place] [address] Phone: [phone] Fax: [phone]     Social Determinants of Health (SDOH) Interventions    Readmission Risk Interventions No flowsheet data found.

## 2021-09-08 NOTE — Evaluation (Signed)
Physical Therapy Evaluation Patient Details Name: Kellie Harris MRN: [id number] DOB: 04/23/1931 Today's Date: 09/08/2021  History of Present Illness  Pt is an 88 y.o. female presenting to hospital 1/26 with near syncopal episode; orthostatic per EMS.  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  Pt admitted with syncope, orthostatic hypotension, acute blood loss anemia, recent L hip sx, hyponatremia, and hypokalemia.   PMH includes DDD, diverticulosis, htn, HDL, osteopenia, pancreatic insufficiency, h/o previous CVA's (without residual deficits), R knee fx, s/p ORIF radius and ulnar fx 2019, seizures, R TKR, L hip fx, L IMN intertrochanteric 2018, previous episodes of vomiting, and aphasia.  Clinical Impression  Prior to recent hospital admissions, pt was modified independent with functional mobility (used rollator within home and RW outside of home); lives alone in 1 level home with 2 STE L railing.  Tolerated LE ex's in bed fairly well with assist for L LE as needed.  Currently pt is min assist semi-supine to sitting edge of bed; CGA with transfers using RW; and CGA ambulating a few feet bed to recliner with youth sized RW use.  Generalized weakness and decreased activity tolerance noted.  Limited distance ambulating per pt's ability to maintain L LE 50% PWB'ing status and fatigue.  1-2/10 L hip pain beginning of session and 4/10 end of session (pt declining pain meds--nurse notified).  Pt would benefit from skilled PT to address noted impairments and functional limitations (see below for any additional details).  Upon hospital discharge, pt would benefit from SNF.       Recommendations for follow up therapy are one component of a multi-disciplinary discharge planning process, led by the attending physician.  Recommendations may be updated based on patient status,  additional functional criteria and insurance authorization.  Follow Up Recommendations Skilled nursing-short term rehab (<3 hours/day)    Assistance Recommended at Discharge Frequent or constant Supervision/Assistance  Patient can return home with the following  A lot of help with walking and/or transfers;A lot of help with bathing/dressing/bathroom;Assistance with cooking/housework;Help with stairs or ramp for entrance;Assist for transportation    Equipment Recommendations Rolling walker (2 wheels);BSC/3in1  Recommendations for Other Services       Functional Status Assessment Patient has had a recent decline in their functional status and demonstrates the ability to make significant improvements in function in a reasonable and predictable amount of time.     Precautions / Restrictions Precautions Precautions: Fall;Anterior Hip Restrictions Weight Bearing Restrictions: Yes LLE Weight Bearing: Partial weight bearing LLE Partial Weight Bearing Percentage or Pounds: 50 % Other Position/Activity Restrictions: Per recent hospital stay PT note: "Per Dr. Harlow Mares ok for limited ambulation at EOB such as bed to chair/BSC even if pt unable to maintain PWB status"      Mobility  Bed Mobility Overal bed mobility: Needs Assistance Bed Mobility: Supine to Sit     Supine to sit: Min assist     General bed mobility comments: assist for L LE management; use of bed rail    Transfers Overall transfer level: Needs assistance Equipment used: Rolling walker (2 wheels) Transfers: Sit to/from Stand Sit to Stand: Min guard           General transfer comment: vc's for UE placement and positioning    Ambulation/Gait Ambulation/Gait assistance: Min guard Gait Distance (Feet): 3 Feet Assistive device: Rolling walker (2 wheels) Gait Pattern/deviations: Step-to pattern, Decreased  stance time - left, Antalgic Gait velocity: decreased     General Gait Details: vc's for increasing UE  support through RW to offweight L LE for PWB'ing precautions; vc's for overal sequencing for WB'ing precautions  Stairs            Wheelchair Mobility    Modified Rankin (Stroke Patients Only)       Balance Overall balance assessment: Needs assistance Sitting-balance support: No upper extremity supported, Feet supported Sitting balance-Leahy Scale: Good Sitting balance - Comments: steady sitting reaching within BOS   Standing balance support: Single extremity supported Standing balance-Leahy Scale: Fair Standing balance comment: steady standing with at least single UE support                             Pertinent Vitals/Pain Pain Assessment Pain Assessment: 0-10 Pain Score: 4  Pain Location: L hip Pain Descriptors / Indicators: Sore Pain Intervention(s): Limited activity within patient's tolerance, Monitored during session, Premedicated before session Vitals (HR and O2 on room air) stable and WFL throughout treatment session.    Home Living Family/patient expects to be discharged to:: Private residence Living Arrangements: Alone Available Help at Discharge: Family;Available PRN/intermittently Type of Home: House Home Access: Stairs to enter Entrance Stairs-Rails: Left Entrance Stairs-Number of Steps: 2   Home Layout: One level Home Equipment: Conservation officer, nature (2 wheels);Rollator (4 wheels);Shower seat;Cane - quad;Cane - single point;Hand held shower head      Prior Function Prior Level of Function : Independent/Modified Independent             Mobility Comments: Modified independent ambulating with rollator within home and RW outside of home. ADLs Comments: Pt independent with bathing and dressing; pt's daughter assists with IADL's and transportation.  Has paid person for deep cleaning.     Hand Dominance        Extremity/Trunk Assessment   Upper Extremity Assessment Upper Extremity Assessment: Defer to OT evaluation;Generalized weakness     Lower Extremity Assessment Lower Extremity Assessment: LLE deficits/detail (R LE WFL) LLE Deficits / Details: at least 3/5 ankle DF/PF AROM; good L quad set strength; at least 3-/5 L hip flexion LLE: Unable to fully assess due to pain    Cervical / Trunk Assessment Cervical / Trunk Assessment: Normal  Communication   Communication: HOH  Cognition Arousal/Alertness: Awake/alert Behavior During Therapy: WFL for tasks assessed/performed Overall Cognitive Status: Within Functional Limits for tasks assessed                                          General Comments General comments (skin integrity, edema, etc.): bandages in place L hip/thigh.  Nursing cleared pt for participation in physical therapy.  Pt agreeable to PT session.  Pt's daughter present during session.    Exercises Total Joint Exercises Ankle Circles/Pumps: AROM, Strengthening, Both, 10 reps, Supine Quad Sets: AROM, Strengthening, Both, 10 reps, Supine Short Arc Quad: AROM, Strengthening, Left, 10 reps, Supine Heel Slides: AAROM, Strengthening, Left, 10 reps, Supine Hip ABduction/ADduction: AAROM, Strengthening, Left, 10 reps, Supine   Assessment/Plan    PT Assessment Patient needs continued PT services  PT Problem List Decreased strength;Decreased activity tolerance;Decreased balance;Decreased mobility;Decreased knowledge of use of DME;Decreased knowledge of precautions;Pain       PT Treatment Interventions DME instruction;Gait training;Stair training;Functional mobility training;Therapeutic activities;Therapeutic exercise;Balance training;Patient/family education  PT Goals (Current goals can be found in the Care Plan section)  Acute Rehab PT Goals Patient Stated Goal: to improve mobility PT Goal Formulation: With patient Time For Goal Achievement: 09/22/21 Potential to Achieve Goals: Good    Frequency 7X/week     Co-evaluation               AM-PAC PT "6 Clicks" Mobility   Outcome Measure Help needed turning from your back to your side while in a flat bed without using bedrails?: None Help needed moving from lying on your back to sitting on the side of a flat bed without using bedrails?: A Little Help needed moving to and from a bed to a chair (including a wheelchair)?: A Little Help needed standing up from a chair using your arms (e.g., wheelchair or bedside chair)?: A Little Help needed to walk in hospital room?: A Lot Help needed climbing 3-5 steps with a railing? : Total 6 Click Score: 16    End of Session Equipment Utilized During Treatment: Gait belt Activity Tolerance: Patient tolerated treatment well Patient left: in chair (with OT present for OT evaluation) Nurse Communication: Mobility status;Weight bearing status;Precautions (pt's pain status) PT Visit Diagnosis: Other abnormalities of gait and mobility (R26.89);Muscle weakness (generalized) (M62.81);Pain Pain - Right/Left: Left Pain - part of body: Hip    Time: 1340-1414 PT Time Calculation (min) (ACUTE ONLY): 34 min   Charges:   PT Evaluation $PT Eval Low Complexity: 1 Low PT Treatments $Therapeutic Exercise: 8-22 mins $Therapeutic Activity: 8-22 mins       Leitha Bleak, PT 09/08/21, 2:43 PM

## 2021-09-08 NOTE — TOC Progression Note (Signed)
Transition of Care [facility]) - Progression Note    Patient Details  Name: Kellie Harris MRN: [id number] Date of Birth: Nov 29, 1930  Transition of Care [facility]) CM/SW [place], RN Phone Number: 09/08/2021, 2:52 PM  Clinical Narrative:   Reached out to [place] at [facility], Requested Ins approval to go to [facility]  Ins pending    Expected Discharge Plan: [place] Barriers to Discharge: Continued Medical Work up  Expected Discharge Plan and Services Expected Discharge Plan: [place] Choice: [place] arrangements for the past 2 months: Single Family Home                                       Social Determinants of Health (SDOH) Interventions    Readmission Risk Interventions No flowsheet data found.

## 2021-09-08 NOTE — Consult Note (Signed)
ANTICOAGULATION CONSULT NOTE   Pharmacy Consult for heparin Indication: possible pulmonary embolus  No Known Allergies  Patient Measurements: Height: 4\' 11"  (149.9 cm) Weight: 43.1 kg (95 lb) IBW/kg (Calculated) : 43.2 Heparin Dosing Weight: 43.1 kg  Vital Signs: Temp: 99.2 F (37.3 C) (01/27 0002) BP: 130/65 (01/27 0002) Pulse Rate: 68 (01/27 0002)  Labs: Recent Labs    09/05/21 0556 09/06/21 0640 09/07/21 0945 09/07/21 1937 09/08/21 0151  HGB 9.5* 8.5* 9.8*  --  7.5*  HCT 28.6* 25.4* 29.7*  --  22.9*  PLT 163 128* 188  --  155  APTT  --   --   --  39*  --   LABPROT  --   --   --  15.2  --   INR  --   --   --  1.2  --   HEPARINUNFRC  --   --   --   --  <0.10*  CREATININE 0.70  --  0.70  --  0.71     Estimated Creatinine Clearance: 33.1 mL/min (by C-G formula based on SCr of 0.71 mg/dL).   Medical History: Past Medical History:  Diagnosis Date   Arthritis    right knee, left shoulder/ OSTEO   Cancer ([place])    skin    Degenerative disc disease, lumbar    L4-L5   Diverticulosis    ITIS   History of hiatal hernia    Hydronephrosis    right   Hypercholesteremia    Hypertension    CONTROLLED ON MEDS   IBS (irritable bowel syndrome)    Knee fracture, right 03/05/2015   DIFFICULTY WITH AMBULATION   Osteopenia    Ovarian cyst    Pancreatic insufficiency    Stroke [facility])    Wears hearing aid    bilateral    Medications:  Medications Prior to Admission  Medication Sig Dispense Refill Last Dose   acetaminophen (TYLENOL) 500 MG tablet Take 1,000 mg by mouth every 8 (eight) hours as needed for mild pain or moderate pain.   Unknown at PRN   amLODipine (NORVASC) 5 MG tablet Take 5 mg by mouth daily.   09/06/2021 at Unknown   aspirin 81 MG chewable tablet Chew 1 tablet (81 mg total) by mouth 2 (two) times daily. 60 tablet 0 09/06/2021 at 1845   celecoxib (CELEBREX) 200 MG capsule Take 200 mg by mouth daily as needed for mild pain or moderate pain.   Unknown at  PRN   Cholecalciferol (VITAMIN D) 50 MCG (2000 UT) tablet Take 2,000 Units by mouth daily.      docusate sodium (COLACE) 100 MG capsule Take 1 capsule (100 mg total) by mouth 2 (two) times daily. 10 capsule 0    HYDROcodone-acetaminophen (NORCO/VICODIN) 5-325 MG tablet Take 1 tablet by mouth every 4 (four) hours as needed for moderate pain (pain score 4-6). 30 tablet 0 Unknown at PRN   lisinopril (ZESTRIL) 20 MG tablet Take 1 tablet by mouth 2 (two) times daily.   09/06/2021 at 1845   metoprolol succinate (TOPROL-XL) 25 MG 24 hr tablet Take 12.5 mg by mouth daily.   Past Week at Unknown   Multiple Vitamin (MULTIVITAMIN) capsule Take 1 capsule by mouth daily.      pravastatin (PRAVACHOL) 40 MG tablet Take 40 mg by mouth at bedtime.   09/06/2021 at 2100   Scheduled:   amLODipine  5 mg Oral Daily   aspirin  81 mg Oral BID   cholecalciferol  2,000  Units Oral Daily   docusate sodium  100 mg Oral BID   lisinopril  20 mg Oral BID   metoprolol succinate  12.5 mg Oral Daily   multivitamin with minerals  1 tablet Oral Daily   pravastatin  40 mg Oral QHS   Infusions:   sodium chloride 75 mL/hr at 09/07/21 1425   heparin 700 Units/hr (09/07/21 2017)   PRN: acetaminophen, hydrALAZINE, HYDROcodone-acetaminophen, ondansetron (ZOFRAN) IV Anti-infectives (From admission, onward)    None       Assessment: 88 y.o. female with a history of arthritis, CVA status post left hip fracture who is currently in rehab day 1 who presents after a near syncopal episode. Pharmacy consulted to start heparin for possible PE. Pt refusing CAT scan. No note of DOAC PTA. CBC stable.   Goal of Therapy:  Heparin level 0.3-0.7 units/ml Monitor platelets by anticoagulation protocol: Yes  1/27 0151 HL < 0.1   Plan:  Give 1300 unit bolus x 1 Increase heparin infusion to 850 units/hr Recheck HL in 8 hrs after rate change CBC daily while on heparin  Renda Rolls, PharmD, [facility] 09/08/2021 2:58 AM

## 2021-09-08 NOTE — Progress Notes (Signed)
PROGRESS NOTE    [name]  HQI:[id number] DOB: September 08, 1930 DOA: 09/07/2021 PCP: Dion Body, MD   Chief complaint.  Syncope. Brief Narrative:  Kellie Harris is a 88 y.o. female with medical history significant of hypertension, hyperlipidemia, stroke, IBS, skin cancer, diverticulosis, pancreatic insufficiency, hard of hearing, who presents with syncope. Patient was orthostatic, she was given fluids.  Her hemoglobin is low at 7.5.  Assessment & Plan:   Principal Problem:   Syncope Active Problems:   Closed left hip fracture (HCC)   Essential hypertension   HLD (hyperlipidemia)   History of CVA (cerebrovascular accident)   Leukocytosis   Hypokalemia   Hyponatremia  Syncope and collapse  Orthostatic hypotension. Essential hypertension. This is appears to be secondary to blood pressure medicine in addition to dehydration. Patient received fluid overnight, blood pressure is better. Continue lisinopril and metoprolol. Hold off amlodipine for now. I reevaluate the patient, there is a very low risk for PE.  Patient had elevated D-dimer from recent surgery.  She has no respiratory symptoms, she does not have a DVT in lower extremities.  I will discontinue heparin, placed her on prophylactic Lovenox.   Acute blood loss anemia. Recent left hip surgery. Patient hemoglobin was 8.5 at the time of discharge last time, current hemoglobin dropped down to 7.5.  This is still from acute blood loss from prior hip surgery. Patient has borderline B12 level, will start B12 injection, recheck B12 level. Also check iron level. Follow-up hemoglobin and transfuse as needed.  Hyponatremia Hypokalemia Hyponatremia was secondary to dehydration, potassium has normalized.    DVT prophylaxis: Lovenox Code Status: DNR Family Communication: Daughter updated, all questions answered. Disposition Plan:    Status is: Observation  Patient can be transferred to nursing home today, however, spoke  with social worker, patient need a preauthorization from insurance company before transfer.  Most likely will be discharged tomorrow.       I/O last 3 completed shifts: In: 232 [I.V.:232] Out: -  Total I/O In: 500 [P.O.:500] Out: -      Consultants:  None  Procedures: None  Antimicrobials: none  Subjective: Patient feels much better today, she denies any chest pain or shortness of breath.  She does not have a cough. She does not have a pain in the legs, no leg edema. No fever chills  Her appetite is well, no nausea vomiting.  Objective: Vitals:   09/07/21 2126 09/08/21 0002 09/08/21 0413 09/08/21 0822  BP: (!) 157/83 130/65 126/73 130/65  Pulse: (!) 102 68 (!) 59 78  Resp: 18 17 18 16   Temp: 99.5 F (37.5 C) 99.2 F (37.3 C) (!) 97.5 F (36.4 C) 98.4 F (36.9 C)  TempSrc:      SpO2: 90% 97% 92% 99%  Weight:      Height:        Intake/Output Summary (Last 24 hours) at 09/08/2021 1029 Last data filed at 09/08/2021 1022 Gross per 24 hour  Intake 732.03 ml  Output --  Net 732.03 ml   Filed Weights   09/07/21 0936  Weight: 43.1 kg    Examination:  General exam: Appears calm and comfortable  Respiratory system: Clear to auscultation. Respiratory effort normal. Cardiovascular system: S1 & S2 heard, RRR. No JVD, murmurs, rubs, gallops or clicks. No pedal edema. Gastrointestinal system: Abdomen is nondistended, soft and nontender. No organomegaly or masses felt. Normal bowel sounds heard. Central nervous system: Alert and oriented x3. No focal neurological deficits. Extremities: Symmetric 5 x  5 power. Skin: No rashes, lesions or ulcers Psychiatry: Judgement and insight appear normal. Mood & affect appropriate.     Data Reviewed: I have personally reviewed following labs and imaging studies  CBC: Recent Labs  Lab 09/05/21 0556 09/06/21 0640 09/07/21 0945 09/08/21 0151  WBC 13.9* 12.8* 23.2* 11.8*  HGB 9.5* 8.5* 9.8* 7.5*  HCT 28.6* 25.4* 29.7*  22.9*  MCV 90.8 90.1 92.5 93.5  PLT 163 128* 188 254   Basic Metabolic Panel: Recent Labs  Lab 09/05/21 0556 09/07/21 0945 09/08/21 0151  NA 138 133* 132*  K 3.9 3.0* 3.7  CL 103 96* 102  CO2 26 27 25   GLUCOSE 129* 116* 102*  BUN 14 13 17   CREATININE 0.70 0.70 0.71  CALCIUM 8.6* 7.9* 7.1*  MG  --  1.8  --    GFR: Estimated Creatinine Clearance: 33.1 mL/min (by C-G formula based on SCr of 0.71 mg/dL). Liver Function Tests: Recent Labs  Lab 09/07/21 0945  AST 29  ALT 11  ALKPHOS 65  BILITOT 1.4*  PROT 6.6  ALBUMIN 3.3*   No results for input(s): LIPASE, AMYLASE in the last 168 hours. No results for input(s): AMMONIA in the last 168 hours. Coagulation Profile: Recent Labs  Lab 09/07/21 1937  INR 1.2   Cardiac Enzymes: No results for input(s): CKTOTAL, CKMB, CKMBINDEX, TROPONINI in the last 168 hours. BNP (last 3 results) No results for input(s): PROBNP in the last 8760 hours. HbA1C: No results for input(s): HGBA1C in the last 72 hours. CBG: No results for input(s): GLUCAP in the last 168 hours. Lipid Profile: No results for input(s): CHOL, HDL, LDLCALC, TRIG, CHOLHDL, LDLDIRECT in the last 72 hours. Thyroid Function Tests: No results for input(s): TSH, T4TOTAL, FREET4, T3FREE, THYROIDAB in the last 72 hours. Anemia Panel: No results for input(s): VITAMINB12, FOLATE, FERRITIN, TIBC, IRON, RETICCTPCT in the last 72 hours. Sepsis Labs: No results for input(s): PROCALCITON, LATICACIDVEN in the last 168 hours.  Recent Results (from the past 240 hour(s))  SARS CORONAVIRUS 2 (TAT 6-24 HRS) Nasopharyngeal Nasopharyngeal Swab     Status: None   Collection Time: 08/31/21 12:08 PM   Specimen: Nasopharyngeal Swab  Result Value Ref Range Status   SARS Coronavirus 2 NEGATIVE NEGATIVE Final    Comment: (NOTE) SARS-CoV-2 target nucleic acids are NOT DETECTED.  The SARS-CoV-2 RNA is generally detectable in upper and lower respiratory specimens during the acute phase of  infection. Negative results do not preclude SARS-CoV-2 infection, do not rule out co-infections with other pathogens, and should not be used as the sole basis for treatment or other patient management decisions. Negative results must be combined with clinical observations, patient history, and epidemiological information. The expected result is Negative.  Fact Sheet for Patients: SugarRoll.be  Fact Sheet for Healthcare Providers: https://www.woods-mathews.com/  This test is not yet approved or cleared by the [place] FDA and  has been authorized for detection and/or diagnosis of SARS-CoV-2 by FDA under an Emergency Use Authorization (EUA). This EUA will remain  in effect (meaning this test can be used) for the duration of the COVID-19 declaration under Se ction 564(b)(1) of the Act, 21 U.S.C. section 360bbb-3(b)(1), unless the authorization is terminated or revoked sooner.  Performed at [facility], [place] [address] [place],  [address]   Resp Panel by RT-PCR (Flu A&B, Covid) Nasopharyngeal Swab     Status: None   Collection Time: 09/06/21 10:36 AM   Specimen: Nasopharyngeal Swab; Nasopharyngeal(NP) swabs in vial  transport medium  Result Value Ref Range Status   SARS Coronavirus 2 by RT PCR NEGATIVE NEGATIVE Final    Comment: (NOTE) SARS-CoV-2 target nucleic acids are NOT DETECTED.  The SARS-CoV-2 RNA is generally detectable in upper respiratory specimens during the acute phase of infection. The lowest concentration of SARS-CoV-2 viral copies this assay can detect is 138 copies/mL. A negative result does not preclude SARS-Cov-2 infection and should not be used as the sole basis for treatment or other patient management decisions. A negative result may occur with  improper specimen collection/handling, submission of specimen other than nasopharyngeal swab, presence of viral mutation(s) within the areas targeted by this  assay, and inadequate number of viral copies(<138 copies/mL). A negative result must be combined with clinical observations, patient history, and epidemiological information. The expected result is Negative.  Fact Sheet for Patients:  EntrepreneurPulse.com.au  Fact Sheet for Healthcare Providers:  IncredibleEmployment.be  This test is no t yet approved or cleared by the [place] FDA and  has been authorized for detection and/or diagnosis of SARS-CoV-2 by FDA under an Emergency Use Authorization (EUA). This EUA will remain  in effect (meaning this test can be used) for the duration of the COVID-19 declaration under Section 564(b)(1) of the Act, 21 U.S.C.section 360bbb-3(b)(1), unless the authorization is terminated  or revoked sooner.       Influenza A by PCR NEGATIVE NEGATIVE Final   Influenza B by PCR NEGATIVE NEGATIVE Final    Comment: (NOTE) The Xpert Xpress SARS-CoV-2/FLU/RSV plus assay is intended as an aid in the diagnosis of influenza from Nasopharyngeal swab specimens and should not be used as a sole basis for treatment. Nasal washings and aspirates are unacceptable for Xpert Xpress SARS-CoV-2/FLU/RSV testing.  Fact Sheet for Patients: EntrepreneurPulse.com.au  Fact Sheet for Healthcare Providers: IncredibleEmployment.be  This test is not yet approved or cleared by the [place] FDA and has been authorized for detection and/or diagnosis of SARS-CoV-2 by FDA under an Emergency Use Authorization (EUA). This EUA will remain in effect (meaning this test can be used) for the duration of the COVID-19 declaration under Section 564(b)(1) of the Act, 21 U.S.C. section 360bbb-3(b)(1), unless the authorization is terminated or revoked.  Performed at [facility], [address] [place], [place] [address]          Radiology Studies: CT HEAD WO CONTRAST (5MM)  Result Date:  09/07/2021 CLINICAL DATA:  Syncope/presyncope, cerebrovascular cause suspected EXAM: CT HEAD WITHOUT CONTRAST TECHNIQUE: Contiguous axial images were obtained from the base of the skull through the vertex without intravenous contrast. RADIATION DOSE REDUCTION: This exam was performed according to the departmental dose-optimization program which includes automated exposure control, adjustment of the mA and/or kV according to patient size and/or use of iterative reconstruction technique. COMPARISON:  06/12/2021 FINDINGS: Brain: There is atrophy and chronic small vessel disease changes. Old lacunar infarcts in the right thalamus and right periventricular white matter, stable. No acute intracranial abnormality. Specifically, no hemorrhage, hydrocephalus, mass lesion, acute infarction, or significant intracranial injury. Vascular: No hyperdense vessel or unexpected calcification. Skull: No acute calvarial abnormality. Sinuses/Orbits: No acute findings Other: None IMPRESSION: Old lacunar infarcts as above. Atrophy, chronic microvascular disease. No acute intracranial abnormality. Electronically Signed   By: Rolm Baptise M.D.   On: 09/07/2021 17:30   US Venous Img Lower Bilateral (DVT)  Result Date: 09/07/2021 CLINICAL DATA:  Oxygen desaturation.  Near syncope. EXAM: BILATERAL LOWER EXTREMITY VENOUS DOPPLER ULTRASOUND TECHNIQUE: Gray-scale sonography with graded compression, as well  as color Doppler and duplex ultrasound were performed to evaluate the lower extremity deep venous systems from the level of the common femoral vein and including the common femoral, femoral, profunda femoral, popliteal and calf veins including the posterior tibial, peroneal and gastrocnemius veins when visible. The superficial great saphenous vein was also interrogated. Spectral Doppler was utilized to evaluate flow at rest and with distal augmentation maneuvers in the common femoral, femoral and popliteal veins. COMPARISON:  None.  FINDINGS: RIGHT LOWER EXTREMITY Common Femoral Vein: No evidence of thrombus. Normal compressibility, respiratory phasicity and response to augmentation. Saphenofemoral Junction: No evidence of thrombus. Normal compressibility and flow on color Doppler imaging. Profunda Femoral Vein: No evidence of thrombus. Normal compressibility and flow on color Doppler imaging. Femoral Vein: No evidence of thrombus. Normal compressibility, respiratory phasicity and response to augmentation. Popliteal Vein: No evidence of thrombus. Normal compressibility, respiratory phasicity and response to augmentation. Calf Veins: No evidence of thrombus. Normal compressibility and flow on color Doppler imaging. Superficial Great Saphenous Vein: No evidence of thrombus. Normal compressibility. Venous Reflux:  None. Other Findings:  None. LEFT LOWER EXTREMITY Common Femoral Vein: No evidence of thrombus. Normal compressibility, respiratory phasicity and response to augmentation. Saphenofemoral Junction: No evidence of thrombus. Normal compressibility and flow on color Doppler imaging. Profunda Femoral Vein: No evidence of thrombus. Normal compressibility and flow on color Doppler imaging. Femoral Vein: No evidence of thrombus. Normal compressibility, respiratory phasicity and response to augmentation. Popliteal Vein: No evidence of thrombus. Normal compressibility, respiratory phasicity and response to augmentation. Calf Veins: No evidence of thrombus. Normal compressibility and flow on color Doppler imaging. Superficial Great Saphenous Vein: No evidence of thrombus. Normal compressibility. Venous Reflux:  None. Other Findings: Left popliteal fossa cyst measuring 3.8 cm in diameter. IMPRESSION: No evidence of deep venous thrombosis in either lower extremity. 3.8 cm Baker's cyst on the left. Electronically Signed   By: Nelson Chimes M.D.   On: 09/07/2021 16:54   DG Chest Port 1 View  Result Date: 09/07/2021 CLINICAL DATA:  Weakness EXAM:  PORTABLE CHEST 1 VIEW COMPARISON:  None. FINDINGS: Normal mediastinum and cardiac silhouette. Lungs are hyperinflated. Normal pulmonary vasculature. No evidence of effusion, infiltrate, or pneumothorax. No acute bony abnormality. IMPRESSION: Hyperinflated lungs.  No acute findings Electronically Signed   By: Suzy Bouchard M.D.   On: 09/07/2021 11:14   DG HIP UNILAT WITH PELVIS 2-3 VIEWS LEFT  Result Date: 09/08/2021 CLINICAL DATA:  Left hip pain. EXAM: DG HIP (WITH OR WITHOUT PELVIS) 2-3V LEFT COMPARISON:  Intraoperative fluoroscopic images 09/04/2021. Hip radiographs 08/24/2017. FINDINGS: Sequelae of recent left total hip arthroplasty are again identified. No acute fracture or hip dislocation is identified. Residual postoperative gas is present in the adjacent soft tissues, and skin staples are in place. The bones are diffusely osteopenic. Atherosclerotic vascular calcifications are noted. IMPRESSION: Left total hip arthroplasty without evidence of acute osseous abnormality. Electronically Signed   By: Logan Bores M.D.   On: 09/08/2021 08:10        Scheduled Meds:  amLODipine  5 mg Oral Daily   aspirin  81 mg Oral BID   cholecalciferol  2,000 Units Oral Daily   cyanocobalamin  1,000 mcg Intramuscular Once   docusate sodium  100 mg Oral BID   lisinopril  20 mg Oral BID   metoprolol succinate  12.5 mg Oral Daily   multivitamin with minerals  1 tablet Oral Daily   pravastatin  40 mg Oral QHS   Continuous Infusions:  heparin  850 Units/hr (09/08/21 0322)     LOS: 0 days    Time spent: 34 minutes, more than 50% time involved in direct patient care.    Sharen Hones, MD [facility]   To contact the attending provider between 7A-7P or the covering provider during after hours 7P-7A, please log into the web site [email or web address] and access using universal [place] password for that web site. If you do not have the password, please call the hospital operator.  09/08/2021, 10:29 AM

## 2021-09-08 NOTE — Evaluation (Signed)
Occupational Therapy Evaluation Patient Details Name: Kellie Harris MRN: [id number] DOB: February 13, 1931 Today's Date: 09/08/2021   History of Present Illness Pt is an 88 y.o. female presenting to hospital 1/26 with near syncopal episode; orthostatic per EMS.  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  Pt admitted with syncope, orthostatic hypotension, acute blood loss anemia, recent L hip sx, hyponatremia, and hypokalemia.   PMH includes DDD, diverticulosis, htn, HDL, osteopenia, pancreatic insufficiency, h/o previous CVA's (without residual deficits), R knee fx, s/p ORIF radius and ulnar fx 2019, seizures, R TKR, L hip fx, L IMN intertrochanteric 2018, previous episodes of vomiting, and aphasia.   Clinical Impression   Pt was seen for OT evaluation this date. Prior to hospital admission, pt was most recently at [facility] for rehab after recent hospitalization and is eager to return in order to do therapy. Currently pt demonstrates impairments as described below (See OT problem list) which functionally limit her ability to perform ADL/self-care tasks. Pt currently requires CGA + VC for ADL transfers w/ RW, MIN-MOD A For LB ADL tasks, and set up and supv for seated UB ADL tasks. Pt/dtr educated in AE/DME for LB ADL tasks. Pt would benefit from skilled OT services to address noted impairments and functional limitations (see below for any additional details) in order to maximize safety and independence while minimizing falls risk and caregiver burden. Upon hospital discharge, recommend STR to maximize pt safety and return to PLOF.     Recommendations for follow up therapy are one component of a multi-disciplinary discharge planning process, led by the attending physician.  Recommendations may be updated based on patient status, additional functional criteria and insurance  authorization.   Follow Up Recommendations  Skilled nursing-short term rehab (<3 hours/day)    Assistance Recommended at Discharge Frequent or constant Supervision/Assistance  Patient can return home with the following A lot of help with walking and/or transfers;A lot of help with bathing/dressing/bathroom;Help with stairs or ramp for entrance;Assist for transportation;Assistance with cooking/housework    Functional Status Assessment  Patient has had a recent decline in their functional status and demonstrates the ability to make significant improvements in function in a reasonable and predictable amount of time.  Equipment Recommendations  Other (comment) (defer to next venue, anticipate would benefit from [facility])    Recommendations for Other Services       Precautions / Restrictions Precautions Precautions: Fall;Anterior Hip Restrictions Weight Bearing Restrictions: Yes LLE Weight Bearing: Partial weight bearing LLE Partial Weight Bearing Percentage or Pounds: 50% Other Position/Activity Restrictions: Per recent hospital stay PT note: "Per Dr. Harlow Mares ok for limited ambulation at EOB such as bed to chair/BSC even if pt unable to maintain PWB status"      Mobility Bed Mobility               General bed mobility comments: NT, up in recliner at start and end of session    Transfers Overall transfer level: Needs assistance Equipment used: Rolling walker (2 wheels) Transfers: Sit to/from Stand Sit to Stand: Min guard           General transfer comment: VC for hand/foot placement and good follow through with maintaining PWBing LLE      Balance Overall balance assessment: Needs assistance Sitting-balance support: No upper extremity supported, Feet supported Sitting balance-Leahy Scale: Good     Standing balance support: Single extremity supported,  Bilateral upper extremity supported Standing balance-Leahy Scale: Fair                             ADL  either performed or assessed with clinical judgement   ADL                                         General ADL Comments: Pt currently requires CGA for ADL transfers, MIN-MOD A for LB ADL tasks and set up/supv for seated UB ADL tasks.     Vision         Perception     Praxis      Pertinent Vitals/Pain Pain Assessment Pain Assessment: 0-10 Pain Score: 4  Pain Location: L hip Pain Descriptors / Indicators: Sore Pain Intervention(s): Limited activity within patient's tolerance, Monitored during session, Repositioned, Patient requesting pain meds-RN notified, Ice applied     Hand Dominance Right   Extremity/Trunk Assessment Upper Extremity Assessment Upper Extremity Assessment: Generalized weakness   Lower Extremity Assessment Lower Extremity Assessment: LLE deficits/detail LLE Deficits / Details: at least 3/5 ankle DF/PF AROM; good L quad set strength; at least 3-/5 L hip flexion LLE: Unable to fully assess due to pain   Cervical / Trunk Assessment Cervical / Trunk Assessment: Normal   Communication Communication Communication: HOH   Cognition Arousal/Alertness: Awake/alert Behavior During Therapy: WFL for tasks assessed/performed Overall Cognitive Status: Within Functional Limits for tasks assessed                                       General Comments  bandages in place L hip/thigh    Exercises Other Exercises Other Exercises: Pt/dtr educated in AE/DME for LB ADL tasks to support improved independence and prevent falls while also maintaining PWBing and minimizing LLE pain   Shoulder Instructions      Home Living Family/patient expects to be discharged to:: Private residence Living Arrangements: Alone Available Help at Discharge: Family;Available PRN/intermittently Type of Home: House Home Access: Stairs to enter [employer] of Steps: 2 Entrance Stairs-Rails: Left Home Layout: One level     Bathroom  Shower/Tub: Teacher, early years/pre: Handicapped height     Home Equipment: Conservation officer, nature (2 wheels);Rollator (4 wheels);Shower seat;Cane - quad;Cane - single point;Hand held shower head          Prior Functioning/Environment Prior Level of Function : Independent/Modified Independent             Mobility Comments: Modified independent ambulating with rollator within home and RW outside of home. ADLs Comments: Pt independent with bathing and dressing; pt's daughter assists with IADL's and transportation.  Has paid person for deep cleaning.        OT Problem List: Decreased strength;Decreased activity tolerance;Impaired balance (sitting and/or standing);Decreased safety awareness;Pain;Decreased knowledge of use of DME or AE;Decreased knowledge of precautions;Decreased range of motion      OT Treatment/Interventions: Self-care/ADL training;Manual therapy;Therapeutic exercise;Patient/family education;Balance training;Energy conservation;Therapeutic activities;DME and/or AE instruction    OT Goals(Current goals can be found in the care plan section) Acute Rehab OT Goals Patient Stated Goal: to back to rehab OT Goal Formulation: With patient/family Time For Goal Achievement: 09/22/21 Potential to Achieve Goals: Good ADL Goals Pt Will Perform Lower Body Dressing: with supervision;with set-up;with  adaptive equipment;sit to/from stand Pt Will Transfer to Toilet: with supervision;ambulating (maintaining PWBing, LRAD) Pt Will Perform Toileting - Clothing Manipulation and hygiene: with supervision;sitting/lateral leans Additional ADL Goal #1: Pt will demonstrate understanding of [place] precautions, 3/3 opportunities.  OT Frequency: Min 2X/week    Co-evaluation              AM-PAC OT "6 Clicks" Daily Activity     Outcome Measure Help from another person eating meals?: None Help from another person taking care of personal grooming?: A Little Help from another person  toileting, which includes using toliet, bedpan, or urinal?: A Little Help from another person bathing (including washing, rinsing, drying)?: A Lot Help from another person to put on and taking off regular upper body clothing?: A Little Help from another person to put on and taking off regular lower body clothing?: A Lot 6 Click Score: 17   End of Session Equipment Utilized During Treatment: Rolling walker (2 wheels)  Activity Tolerance: Patient tolerated treatment well Patient left: in chair;with call bell/phone within reach;with family/visitor present;with chair alarm set;Other (comment) (ice pack on L hip)  OT Visit Diagnosis: Unsteadiness on feet (R26.81);Muscle weakness (generalized) (M62.81);Pain Pain - Right/Left: Left Pain - part of body: Hip                Time: 1415-1430 OT Time Calculation (min): 15 min Charges:  OT General Charges $OT Visit: 1 Visit OT Evaluation $OT Eval Moderate Complexity: 1 Mod OT Treatments $Self Care/Home Management : 8-22 mins  Ardeth Perfect., MPH, MS, OTR/L ascom [phone] 09/08/21, 3:00 PM

## 2021-09-08 NOTE — Care Management Obs Status (Signed)
[place] NOTIFICATION   Patient Details  Name: Kellie Harris MRN: [id number] Date of Birth: 1930/12/31   Medicare Observation Status Notification Given:  Yes    Candie Chroman, LCSW 09/08/2021, 9:49 AM

## 2021-09-09 DIAGNOSIS — E876 Hypokalemia: Secondary | ICD-10-CM | POA: Diagnosis not present

## 2021-09-09 DIAGNOSIS — D62 Acute posthemorrhagic anemia: Secondary | ICD-10-CM | POA: Diagnosis not present

## 2021-09-09 DIAGNOSIS — R55 Syncope and collapse: Secondary | ICD-10-CM | POA: Diagnosis not present

## 2021-09-09 DIAGNOSIS — I10 Essential (primary) hypertension: Secondary | ICD-10-CM | POA: Diagnosis not present

## 2021-09-09 LAB — BASIC METABOLIC PANEL
Anion gap: 10 (ref 5–15)
BUN: 15 mg/dL (ref 8–23)
CO2: 26 mmol/L (ref 22–32)
Calcium: 8.7 mg/dL — ABNORMAL LOW (ref 8.9–10.3)
Chloride: 101 mmol/L (ref 98–111)
Creatinine, Ser: 0.61 mg/dL (ref 0.44–1.00)
GFR, Estimated: >60 mL/min (ref >60)
Glucose, Bld: 89 mg/dL (ref 70–99)
Potassium: 3.3 mmol/L — ABNORMAL LOW (ref 3.5–5.1)
Sodium: 137 mmol/L (ref 135–145)

## 2021-09-09 LAB — CBC WITH DIFFERENTIAL/PLATELET
Abs Immature Granulocytes: 0.05 10*3/uL (ref 0.00–0.07)
Basophils Absolute: 0 10*3/uL (ref 0.0–0.1)
Basophils Relative: 0 %
Eosinophils Absolute: 0.4 10*3/uL (ref 0.0–0.5)
Eosinophils Relative: 4 %
HCT: 24.8 % — ABNORMAL LOW (ref 36.0–46.0)
Hemoglobin: 8 g/dL — ABNORMAL LOW (ref 12.0–15.0)
Immature Granulocytes: 1 %
Lymphocytes Relative: 8 %
Lymphs Abs: 0.8 10*3/uL (ref 0.7–4.0)
MCH: 30 pg (ref 26.0–34.0)
MCHC: 32.3 g/dL (ref 30.0–36.0)
MCV: 92.9 fL (ref 80.0–100.0)
Monocytes Absolute: 0.7 10*3/uL (ref 0.1–1.0)
Monocytes Relative: 8 %
Neutro Abs: 7.5 10*3/uL (ref 1.7–7.7)
Neutrophils Relative %: 79 %
Platelets: 228 10*3/uL (ref 150–400)
RBC: 2.67 MIL/uL — ABNORMAL LOW (ref 3.87–5.11)
RDW: 14.2 % (ref 11.5–15.5)
WBC: 9.5 10*3/uL (ref 4.0–10.5)
nRBC: 0 % (ref 0.0–0.2)

## 2021-09-09 LAB — RESP PANEL BY RT-PCR (FLU A&B, COVID) ARPGX2
Influenza A by PCR: NEGATIVE
Influenza B by PCR: NEGATIVE
SARS Coronavirus 2 by RT PCR: NEGATIVE

## 2021-09-09 LAB — MAGNESIUM: Magnesium: 1.8 mg/dL (ref 1.7–2.4)

## 2021-09-09 MED ORDER — CYANOCOBALAMIN 1000 MCG/ML IJ SOLN
1000.0000 ug | Freq: Once | INTRAMUSCULAR | Status: AC
  Administered 2021-09-09: 1000 ug via INTRAMUSCULAR
  Filled 2021-09-09: qty 1

## 2021-09-09 MED ORDER — POTASSIUM CHLORIDE 10 MEQ/100ML IV SOLN
10.0000 meq | INTRAVENOUS | Status: AC
  Administered 2021-09-09 (×2): 10 meq via INTRAVENOUS
  Filled 2021-09-09 (×2): qty 100

## 2021-09-09 NOTE — TOC Progression Note (Signed)
Transition of Care [facility]) - Progression Note    Patient Details  Name: Kellie Harris MRN: [id number] Date of Birth: August 12, 1931  Transition of Care [facility]) CM/SW [place], RN Phone Number: 09/09/2021, 10:10 AM  Clinical Narrative:   Clement Sayres and spoke to [facility], The request for approval to go to [facility] has went to Medical Review, still pending    Expected Discharge Plan: [place] Barriers to Discharge: Continued Medical Work up  Expected Discharge Plan and Services Expected Discharge Plan: [place] Choice: [place] arrangements for the past 2 months: Single Family Home                                       Social Determinants of Health (SDOH) Interventions    Readmission Risk Interventions No flowsheet data found.

## 2021-09-09 NOTE — TOC Progression Note (Addendum)
Transition of Care [facility]) - Progression Note    Patient Details  Name: Kellie Harris MRN: [id number] Date of Birth: May 25, 1931  Transition of Care [facility]) CM/SW [place], RN Phone Number: 09/09/2021, 1:45 PM  Clinical Narrative:   received notice thqt the Ins was approved for th patient to go to [facility] transport was not approved, SNF Auth [id number], I reached out to [facility] to ask if they can accept the patient, awaiting a response  I provided the patient with the denial paperwork from [facility] for the EMS and explained that family would have to take her, she said her daughter will take her  Still waiting tio hear back from [facility]  Expected Discharge Plan: [place] Barriers to Discharge: Continued Medical Work up  Expected Discharge Plan and Services Expected Discharge Plan: [place] Choice: [place] arrangements for the past 2 months: Single Family Home                                       Social Determinants of Health (SDOH) Interventions    Readmission Risk Interventions No flowsheet data found.

## 2021-09-09 NOTE — Progress Notes (Signed)
PROGRESS NOTE    Kellie Harris  LNL:[id number] DOB: Jul 06, 1931 DOA: 09/07/2021 PCP: Dion Body, MD    Brief Narrative:  Kellie Harris is a 88 y.o. female with medical history significant of hypertension, hyperlipidemia, stroke, IBS, skin cancer, diverticulosis, pancreatic insufficiency, hard of hearing, who presents with syncope. Patient was orthostatic, she was given fluids.  Her hemoglobin is low at 7.5.   Assessment & Plan:   Principal Problem:   Syncope Active Problems:   Closed left hip fracture (HCC)   Essential hypertension   HLD (hyperlipidemia)   History of CVA (cerebrovascular accident)   Leukocytosis   Hypokalemia   Hyponatremia   Acute blood loss anemia   Syncope and collapse  Orthostatic hypotension. Essential hypertension. Patient condition much improved.  No additional hypotension.     Acute blood loss anemia. Iron deficient anemia. Recent left hip surgery. B12 level borderline, will give another dose of B12 injection while pending homocystine. Received IV iron, hemoglobin is better.   Hyponatremia Hypokalemia Condition had improved. Continue replete potassium.       DVT prophylaxis: Lovenox Code Status: DNR Family Communication: Daughter updated, all questions answered. Disposition Plan:      Status is: Observation  Patient has been approved by insurance company to return to SNF, however, discussed with Education officer, museum, there is no response from SNF.     I/O last 3 completed shifts: In: 98 [P.O.:620; I.V.:232] Out: -  Total I/O In: 480 [P.O.:480] Out: -     Subjective: Patient doing well, currently she has no complaints.  She has good appetite without nausea vomiting.  No short of breath or cough.  No chest pain.  Objective: Vitals:   09/08/21 1526 09/08/21 1926 09/09/21 0253 09/09/21 0731  BP: 124/67 122/68 125/86 (!) 142/76  Pulse: 86 66 (!) 110 (!) 58  Resp: 18 16 16 14   Temp: (!) 97.5 F (36.4 C) 98.6 F (37 C) 98.2 F  (36.8 C) (!) 97.3 F (36.3 C)  TempSrc:      SpO2: 99% 94% (!) 87% 97%  Weight:      Height:        Intake/Output Summary (Last 24 hours) at 09/09/2021 1441 Last data filed at 09/09/2021 1300 Gross per 24 hour  Intake 600 ml  Output --  Net 600 ml   Filed Weights   09/07/21 0936  Weight: 43.1 kg    Examination:  General exam: Appears calm and comfortable  Respiratory system: Clear to auscultation. Respiratory effort normal. Cardiovascular system: S1 & S2 heard, RRR. No JVD, murmurs, rubs, gallops or clicks. No pedal edema. Gastrointestinal system: Abdomen is nondistended, soft and nontender. No organomegaly or masses felt. Normal bowel sounds heard. Central nervous system: Alert and oriented. No focal neurological deficits. Extremities: Symmetric 5 x 5 power. Skin: No rashes, lesions or ulcers Psychiatry: Judgement and insight appear normal. Mood & affect appropriate.     Data Reviewed: I have personally reviewed following labs and imaging studies  CBC: Recent Labs  Lab 09/05/21 0556 09/06/21 0640 09/07/21 0945 09/08/21 0151 09/09/21 0404  WBC 13.9* 12.8* 23.2* 11.8* 9.5  NEUTROABS  --   --   --   --  7.5  HGB 9.5* 8.5* 9.8* 7.5* 8.0*  HCT 28.6* 25.4* 29.7* 22.9* 24.8*  MCV 90.8 90.1 92.5 93.5 92.9  PLT 163 128* 188 155 408   Basic Metabolic Panel: Recent Labs  Lab 09/05/21 0556 09/07/21 0945 09/08/21 0151 09/09/21 0404  NA 138 133* 132*  137  K 3.9 3.0* 3.7 3.3*  CL 103 96* 102 101  CO2 26 27 25 26   GLUCOSE 129* 116* 102* 89  BUN 14 13 17 15   CREATININE 0.70 0.70 0.71 0.61  CALCIUM 8.6* 7.9* 7.1* 8.7*  MG  --  1.8  --  1.8   GFR: Estimated Creatinine Clearance: 33.1 mL/min (by C-G formula based on SCr of 0.61 mg/dL). Liver Function Tests: Recent Labs  Lab 09/07/21 0945  AST 29  ALT 11  ALKPHOS 65  BILITOT 1.4*  PROT 6.6  ALBUMIN 3.3*   No results for input(s): LIPASE, AMYLASE in the last 168 hours. No results for input(s): AMMONIA in the  last 168 hours. Coagulation Profile: Recent Labs  Lab 09/07/21 1937  INR 1.2   Cardiac Enzymes: No results for input(s): CKTOTAL, CKMB, CKMBINDEX, TROPONINI in the last 168 hours. BNP (last 3 results) No results for input(s): PROBNP in the last 8760 hours. HbA1C: No results for input(s): HGBA1C in the last 72 hours. CBG: No results for input(s): GLUCAP in the last 168 hours. Lipid Profile: No results for input(s): CHOL, HDL, LDLCALC, TRIG, CHOLHDL, LDLDIRECT in the last 72 hours. Thyroid Function Tests: No results for input(s): TSH, T4TOTAL, FREET4, T3FREE, THYROIDAB in the last 72 hours. Anemia Panel: Recent Labs    09/08/21 0151 09/08/21 1134  VITAMINB12  --  230  FERRITIN 200  --   TIBC 150*  --   IRON 10*  --    Sepsis Labs: No results for input(s): PROCALCITON, LATICACIDVEN in the last 168 hours.  Recent Results (from the past 240 hour(s))  SARS CORONAVIRUS 2 (TAT 6-24 HRS) Nasopharyngeal Nasopharyngeal Swab     Status: None   Collection Time: 08/31/21 12:08 PM   Specimen: Nasopharyngeal Swab  Result Value Ref Range Status   SARS Coronavirus 2 NEGATIVE NEGATIVE Final    Comment: (NOTE) SARS-CoV-2 target nucleic acids are NOT DETECTED.  The SARS-CoV-2 RNA is generally detectable in upper and lower respiratory specimens during the acute phase of infection. Negative results do not preclude SARS-CoV-2 infection, do not rule out co-infections with other pathogens, and should not be used as the sole basis for treatment or other patient management decisions. Negative results must be combined with clinical observations, patient history, and epidemiological information. The expected result is Negative.  Fact Sheet for Patients: SugarRoll.be  Fact Sheet for Healthcare Providers: https://www.woods-mathews.com/  This test is not yet approved or cleared by the [place] FDA and  has been authorized for detection and/or  diagnosis of SARS-CoV-2 by FDA under an Emergency Use Authorization (EUA). This EUA will remain  in effect (meaning this test can be used) for the duration of the COVID-19 declaration under Se ction 564(b)(1) of the Act, 21 U.S.C. section 360bbb-3(b)(1), unless the authorization is terminated or revoked sooner.  Performed at [facility], [place] [address], [place], [place] [address]   Resp Panel by RT-PCR (Flu A&B, Covid) Nasopharyngeal Swab     Status: None   Collection Time: 09/06/21 10:36 AM   Specimen: Nasopharyngeal Swab; Nasopharyngeal(NP) swabs in vial transport medium  Result Value Ref Range Status   SARS Coronavirus 2 by RT PCR NEGATIVE NEGATIVE Final    Comment: (NOTE) SARS-CoV-2 target nucleic acids are NOT DETECTED.  The SARS-CoV-2 RNA is generally detectable in upper respiratory specimens during the acute phase of infection. The lowest concentration of SARS-CoV-2 viral copies this assay can detect is 138 copies/mL. A negative result does not preclude  SARS-Cov-2 infection and should not be used as the sole basis for treatment or other patient management decisions. A negative result may occur with  improper specimen collection/handling, submission of specimen other than nasopharyngeal swab, presence of viral mutation(s) within the areas targeted by this assay, and inadequate number of viral copies(<138 copies/mL). A negative result must be combined with clinical observations, patient history, and epidemiological information. The expected result is Negative.  Fact Sheet for Patients:  EntrepreneurPulse.com.au  Fact Sheet for Healthcare Providers:  IncredibleEmployment.be  This test is no t yet approved or cleared by the [place] FDA and  has been authorized for detection and/or diagnosis of SARS-CoV-2 by FDA under an Emergency Use Authorization (EUA). This EUA will remain  in effect (meaning this test can be used) for the  duration of the COVID-19 declaration under Section 564(b)(1) of the Act, 21 U.S.C.section 360bbb-3(b)(1), unless the authorization is terminated  or revoked sooner.       Influenza A by PCR NEGATIVE NEGATIVE Final   Influenza B by PCR NEGATIVE NEGATIVE Final    Comment: (NOTE) The Xpert Xpress SARS-CoV-2/FLU/RSV plus assay is intended as an aid in the diagnosis of influenza from Nasopharyngeal swab specimens and should not be used as a sole basis for treatment. Nasal washings and aspirates are unacceptable for Xpert Xpress SARS-CoV-2/FLU/RSV testing.  Fact Sheet for Patients: EntrepreneurPulse.com.au  Fact Sheet for Healthcare Providers: IncredibleEmployment.be  This test is not yet approved or cleared by the [place] FDA and has been authorized for detection and/or diagnosis of SARS-CoV-2 by FDA under an Emergency Use Authorization (EUA). This EUA will remain in effect (meaning this test can be used) for the duration of the COVID-19 declaration under Section 564(b)(1) of the Act, 21 U.S.C. section 360bbb-3(b)(1), unless the authorization is terminated or revoked.  Performed at [facility], [address] [place], [place] [address]   Resp Panel by RT-PCR (Flu A&B, Covid) Nasopharyngeal Swab     Status: None   Collection Time: 09/09/21  1:45 PM   Specimen: Nasopharyngeal Swab; Nasopharyngeal(NP) swabs in vial transport medium  Result Value Ref Range Status   SARS Coronavirus 2 by RT PCR NEGATIVE NEGATIVE Final    Comment: (NOTE) SARS-CoV-2 target nucleic acids are NOT DETECTED.  The SARS-CoV-2 RNA is generally detectable in upper respiratory specimens during the acute phase of infection. The lowest concentration of SARS-CoV-2 viral copies this assay can detect is 138 copies/mL. A negative result does not preclude SARS-Cov-2 infection and should not be used as the sole basis for treatment or other patient management  decisions. A negative result may occur with  improper specimen collection/handling, submission of specimen other than nasopharyngeal swab, presence of viral mutation(s) within the areas targeted by this assay, and inadequate number of viral copies(<138 copies/mL). A negative result must be combined with clinical observations, patient history, and epidemiological information. The expected result is Negative.  Fact Sheet for Patients:  EntrepreneurPulse.com.au  Fact Sheet for Healthcare Providers:  IncredibleEmployment.be  This test is no t yet approved or cleared by the [place] FDA and  has been authorized for detection and/or diagnosis of SARS-CoV-2 by FDA under an Emergency Use Authorization (EUA). This EUA will remain  in effect (meaning this test can be used) for the duration of the COVID-19 declaration under Section 564(b)(1) of the Act, 21 U.S.C.section 360bbb-3(b)(1), unless the authorization is terminated  or revoked sooner.       Influenza A by PCR NEGATIVE NEGATIVE Final  Influenza B by PCR NEGATIVE NEGATIVE Final    Comment: (NOTE) The Xpert Xpress SARS-CoV-2/FLU/RSV plus assay is intended as an aid in the diagnosis of influenza from Nasopharyngeal swab specimens and should not be used as a sole basis for treatment. Nasal washings and aspirates are unacceptable for Xpert Xpress SARS-CoV-2/FLU/RSV testing.  Fact Sheet for Patients: EntrepreneurPulse.com.au  Fact Sheet for Healthcare Providers: IncredibleEmployment.be  This test is not yet approved or cleared by the [place] FDA and has been authorized for detection and/or diagnosis of SARS-CoV-2 by FDA under an Emergency Use Authorization (EUA). This EUA will remain in effect (meaning this test can be used) for the duration of the COVID-19 declaration under Section 564(b)(1) of the Act, 21 U.S.C. section 360bbb-3(b)(1), unless the  authorization is terminated or revoked.  Performed at [facility], [address] [place], [place] [address]          Radiology Studies: CT HEAD WO CONTRAST (5MM)  Result Date: 09/07/2021 CLINICAL DATA:  Syncope/presyncope, cerebrovascular cause suspected EXAM: CT HEAD WITHOUT CONTRAST TECHNIQUE: Contiguous axial images were obtained from the base of the skull through the vertex without intravenous contrast. RADIATION DOSE REDUCTION: This exam was performed according to the departmental dose-optimization program which includes automated exposure control, adjustment of the mA and/or kV according to patient size and/or use of iterative reconstruction technique. COMPARISON:  06/12/2021 FINDINGS: Brain: There is atrophy and chronic small vessel disease changes. Old lacunar infarcts in the right thalamus and right periventricular white matter, stable. No acute intracranial abnormality. Specifically, no hemorrhage, hydrocephalus, mass lesion, acute infarction, or significant intracranial injury. Vascular: No hyperdense vessel or unexpected calcification. Skull: No acute calvarial abnormality. Sinuses/Orbits: No acute findings Other: None IMPRESSION: Old lacunar infarcts as above. Atrophy, chronic microvascular disease. No acute intracranial abnormality. Electronically Signed   By: Rolm Baptise M.D.   On: 09/07/2021 17:30   US Venous Img Lower Bilateral (DVT)  Result Date: 09/07/2021 CLINICAL DATA:  Oxygen desaturation.  Near syncope. EXAM: BILATERAL LOWER EXTREMITY VENOUS DOPPLER ULTRASOUND TECHNIQUE: Gray-scale sonography with graded compression, as well as color Doppler and duplex ultrasound were performed to evaluate the lower extremity deep venous systems from the level of the common femoral vein and including the common femoral, femoral, profunda femoral, popliteal and calf veins including the posterior tibial, peroneal and gastrocnemius veins when visible. The superficial great  saphenous vein was also interrogated. Spectral Doppler was utilized to evaluate flow at rest and with distal augmentation maneuvers in the common femoral, femoral and popliteal veins. COMPARISON:  None. FINDINGS: RIGHT LOWER EXTREMITY Common Femoral Vein: No evidence of thrombus. Normal compressibility, respiratory phasicity and response to augmentation. Saphenofemoral Junction: No evidence of thrombus. Normal compressibility and flow on color Doppler imaging. Profunda Femoral Vein: No evidence of thrombus. Normal compressibility and flow on color Doppler imaging. Femoral Vein: No evidence of thrombus. Normal compressibility, respiratory phasicity and response to augmentation. Popliteal Vein: No evidence of thrombus. Normal compressibility, respiratory phasicity and response to augmentation. Calf Veins: No evidence of thrombus. Normal compressibility and flow on color Doppler imaging. Superficial Great Saphenous Vein: No evidence of thrombus. Normal compressibility. Venous Reflux:  None. Other Findings:  None. LEFT LOWER EXTREMITY Common Femoral Vein: No evidence of thrombus. Normal compressibility, respiratory phasicity and response to augmentation. Saphenofemoral Junction: No evidence of thrombus. Normal compressibility and flow on color Doppler imaging. Profunda Femoral Vein: No evidence of thrombus. Normal compressibility and flow on color Doppler imaging. Femoral Vein: No evidence of thrombus. Normal compressibility,  respiratory phasicity and response to augmentation. Popliteal Vein: No evidence of thrombus. Normal compressibility, respiratory phasicity and response to augmentation. Calf Veins: No evidence of thrombus. Normal compressibility and flow on color Doppler imaging. Superficial Great Saphenous Vein: No evidence of thrombus. Normal compressibility. Venous Reflux:  None. Other Findings: Left popliteal fossa cyst measuring 3.8 cm in diameter. IMPRESSION: No evidence of deep venous thrombosis in either  lower extremity. 3.8 cm Baker's cyst on the left. Electronically Signed   By: Nelson Chimes M.D.   On: 09/07/2021 16:54   DG HIP UNILAT WITH PELVIS 2-3 VIEWS LEFT  Result Date: 09/08/2021 CLINICAL DATA:  Left hip pain. EXAM: DG HIP (WITH OR WITHOUT PELVIS) 2-3V LEFT COMPARISON:  Intraoperative fluoroscopic images 09/04/2021. Hip radiographs 08/24/2017. FINDINGS: Sequelae of recent left total hip arthroplasty are again identified. No acute fracture or hip dislocation is identified. Residual postoperative gas is present in the adjacent soft tissues, and skin staples are in place. The bones are diffusely osteopenic. Atherosclerotic vascular calcifications are noted. IMPRESSION: Left total hip arthroplasty without evidence of acute osseous abnormality. Electronically Signed   By: Logan Bores M.D.   On: 09/08/2021 08:10        Scheduled Meds:  aspirin  81 mg Oral BID   calcium-vitamin D  1 tablet Oral BID   cholecalciferol  2,000 Units Oral Daily   enoxaparin (LOVENOX) injection  30 mg Subcutaneous Q24H   lisinopril  20 mg Oral BID   metoprolol succinate  12.5 mg Oral Daily   multivitamin with minerals  1 tablet Oral Daily   pravastatin  40 mg Oral QHS   Continuous Infusions:   LOS: 0 days    Time spent: 24 minutes    Sharen Hones, MD [facility]   To contact the attending provider between 7A-7P or the covering provider during after hours 7P-7A, please log into the web site [email or web address] and access using universal [place] password for that web site. If you do not have the password, please call the hospital operator.  09/09/2021, 2:41 PM

## 2021-09-09 NOTE — Progress Notes (Signed)
Physical Therapy Treatment Patient Details Name: Kellie Harris MRN: [id number] DOB: February 11, 1931 Today's Date: 09/09/2021   History of Present Illness Pt is an 88 y.o. female presenting to hospital 1/26 with near syncopal episode; orthostatic per EMS.  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  Pt admitted with syncope, orthostatic hypotension, acute blood loss anemia, recent L hip sx, hyponatremia, and hypokalemia.   PMH includes DDD, diverticulosis, htn, HDL, osteopenia, pancreatic insufficiency, h/o previous CVA's (without residual deficits), R knee fx, s/p ORIF radius and ulnar fx 2019, seizures, R TKR, L hip fx, L IMN intertrochanteric 2018, previous episodes of vomiting, and aphasia.    PT Comments    Pt was supine in bed with supportive daughter present. She agrees to session however requested not to get back OOB. Confirmed with RN that pt has been OOB several times earlier in the day. Pt did agree to performing bed level there ex. See exercises performed listed below. Pt overall tolerated session well. She will continue to benefit from skilled PT at DC to address deficits with strength, mobility,gait and transfers. Acute PT will continue to follow per current POC.    Recommendations for follow up therapy are one component of a multi-disciplinary discharge planning process, led by the attending physician.  Recommendations may be updated based on patient status, additional functional criteria and insurance authorization.  Follow Up Recommendations  Skilled nursing-short term rehab (<3 hours/day)     Assistance Recommended at Discharge Frequent or constant Supervision/Assistance  Patient can return home with the following A lot of help with walking and/or transfers;A lot of help with bathing/dressing/bathroom;Assistance with cooking/housework;Help with stairs  or ramp for entrance;Assist for transportation   Equipment Recommendations  Rolling walker (2 wheels);BSC/3in1       Precautions / Restrictions Precautions Precautions: Fall;Anterior Hip Precaution Booklet Issued: Yes (comment) Restrictions Weight Bearing Restrictions: Yes LLE Weight Bearing: Partial weight bearing LLE Partial Weight Bearing Percentage or Pounds: 50     Mobility  Bed Mobility  General bed mobility comments: pt was unwilling to get OOB this session      Balance Overall balance assessment: Needs assistance Sitting-balance support: No upper extremity supported, Feet supported Sitting balance-Leahy Scale: Good     Standing balance support: Single extremity supported, Bilateral upper extremity supported Standing balance-Leahy Scale: Fair Standing balance comment: steady standing with at least single UE support       Cognition Arousal/Alertness: Awake/alert Behavior During Therapy: WFL for tasks assessed/performed Overall Cognitive Status: Within Functional Limits for tasks assessed       Exercises Total Joint Exercises Ankle Circles/Pumps: AROM, 10 reps Quad Sets: AROM, 10 reps Gluteal Sets: AROM, 10 reps Heel Slides: AROM, 10 reps Hip ABduction/ADduction: AROM, 10 reps Straight Leg Raises: AROM, 10 reps        Pertinent Vitals/Pain Pain Assessment Pain Assessment: No/denies pain Pain Score: 0-No pain     PT Goals (current goals can now be found in the care plan section) Acute Rehab PT Goals Patient Stated Goal: to improve mobility Progress towards PT goals: Progressing toward goals    Frequency    7X/week      PT Plan Current plan remains appropriate       AM-PAC PT "6 Clicks" Mobility   Outcome Measure  Help needed turning from your back to your side while in a flat bed without using bedrails?: None Help  needed moving from lying on your back to sitting on the side of a flat bed without using bedrails?: A Little Help needed  moving to and from a bed to a chair (including a wheelchair)?: A Little Help needed standing up from a chair using your arms (e.g., wheelchair or bedside chair)?: A Little Help needed to walk in hospital room?: A Little Help needed climbing 3-5 steps with a railing? : Total 6 Click Score: 17    End of Session   Activity Tolerance: Patient tolerated treatment well Patient left: in bed;with call bell/phone within reach;with bed alarm set;with family/visitor present Nurse Communication: Mobility status;Weight bearing status;Precautions PT Visit Diagnosis: Other abnormalities of gait and mobility (R26.89);Muscle weakness (generalized) (M62.81);Pain Pain - Right/Left: Left Pain - part of body: Hip     Time: 7972-8206 PT Time Calculation (min) (ACUTE ONLY): 10 min  Charges:  $Therapeutic Exercise: 8-22 mins                    Julaine Fusi PTA 09/09/21, 3:56 PM

## 2021-09-10 DIAGNOSIS — Z7982 Long term (current) use of aspirin: Secondary | ICD-10-CM | POA: Diagnosis not present

## 2021-09-10 DIAGNOSIS — E785 Hyperlipidemia, unspecified: Secondary | ICD-10-CM | POA: Diagnosis not present

## 2021-09-10 DIAGNOSIS — D72829 Elevated white blood cell count, unspecified: Secondary | ICD-10-CM | POA: Diagnosis not present

## 2021-09-10 DIAGNOSIS — R0902 Hypoxemia: Secondary | ICD-10-CM | POA: Diagnosis not present

## 2021-09-10 DIAGNOSIS — I639 Cerebral infarction, unspecified: Secondary | ICD-10-CM | POA: Diagnosis not present

## 2021-09-10 DIAGNOSIS — Z20822 Contact with and (suspected) exposure to covid-19: Secondary | ICD-10-CM | POA: Diagnosis not present

## 2021-09-10 DIAGNOSIS — I2510 Atherosclerotic heart disease of native coronary artery without angina pectoris: Secondary | ICD-10-CM | POA: Diagnosis not present

## 2021-09-10 DIAGNOSIS — R000 Tachycardia, unspecified: Secondary | ICD-10-CM | POA: Diagnosis not present

## 2021-09-10 DIAGNOSIS — I6523 Occlusion and stenosis of bilateral carotid arteries: Secondary | ICD-10-CM | POA: Diagnosis not present

## 2021-09-10 DIAGNOSIS — E876 Hypokalemia: Secondary | ICD-10-CM | POA: Diagnosis not present

## 2021-09-10 DIAGNOSIS — R4701 Aphasia: Secondary | ICD-10-CM | POA: Diagnosis not present

## 2021-09-10 DIAGNOSIS — R4781 Slurred speech: Secondary | ICD-10-CM | POA: Diagnosis not present

## 2021-09-10 DIAGNOSIS — R52 Pain, unspecified: Secondary | ICD-10-CM | POA: Diagnosis not present

## 2021-09-10 DIAGNOSIS — I63512 Cerebral infarction due to unspecified occlusion or stenosis of left middle cerebral artery: Secondary | ICD-10-CM | POA: Diagnosis not present

## 2021-09-10 DIAGNOSIS — D62 Acute posthemorrhagic anemia: Secondary | ICD-10-CM | POA: Diagnosis not present

## 2021-09-10 DIAGNOSIS — Z8673 Personal history of transient ischemic attack (TIA), and cerebral infarction without residual deficits: Secondary | ICD-10-CM | POA: Diagnosis not present

## 2021-09-10 DIAGNOSIS — I69320 Aphasia following cerebral infarction: Secondary | ICD-10-CM | POA: Diagnosis not present

## 2021-09-10 DIAGNOSIS — M6281 Muscle weakness (generalized): Secondary | ICD-10-CM | POA: Diagnosis not present

## 2021-09-10 DIAGNOSIS — K588 Other irritable bowel syndrome: Secondary | ICD-10-CM | POA: Diagnosis not present

## 2021-09-10 DIAGNOSIS — Z96642 Presence of left artificial hip joint: Secondary | ICD-10-CM | POA: Diagnosis not present

## 2021-09-10 DIAGNOSIS — E871 Hypo-osmolality and hyponatremia: Secondary | ICD-10-CM | POA: Diagnosis not present

## 2021-09-10 DIAGNOSIS — E119 Type 2 diabetes mellitus without complications: Secondary | ICD-10-CM | POA: Diagnosis not present

## 2021-09-10 DIAGNOSIS — I671 Cerebral aneurysm, nonruptured: Secondary | ICD-10-CM | POA: Diagnosis not present

## 2021-09-10 DIAGNOSIS — I6522 Occlusion and stenosis of left carotid artery: Secondary | ICD-10-CM | POA: Diagnosis not present

## 2021-09-10 DIAGNOSIS — E569 Vitamin deficiency, unspecified: Secondary | ICD-10-CM | POA: Diagnosis not present

## 2021-09-10 DIAGNOSIS — R339 Retention of urine, unspecified: Secondary | ICD-10-CM | POA: Diagnosis not present

## 2021-09-10 DIAGNOSIS — R29704 NIHSS score 4: Secondary | ICD-10-CM | POA: Diagnosis not present

## 2021-09-10 DIAGNOSIS — R29810 Facial weakness: Secondary | ICD-10-CM | POA: Diagnosis not present

## 2021-09-10 DIAGNOSIS — R0689 Other abnormalities of breathing: Secondary | ICD-10-CM | POA: Diagnosis not present

## 2021-09-10 DIAGNOSIS — I951 Orthostatic hypotension: Secondary | ICD-10-CM | POA: Diagnosis not present

## 2021-09-10 DIAGNOSIS — R55 Syncope and collapse: Secondary | ICD-10-CM | POA: Diagnosis not present

## 2021-09-10 DIAGNOSIS — I69392 Facial weakness following cerebral infarction: Secondary | ICD-10-CM | POA: Diagnosis not present

## 2021-09-10 DIAGNOSIS — Z7902 Long term (current) use of antithrombotics/antiplatelets: Secondary | ICD-10-CM | POA: Diagnosis not present

## 2021-09-10 DIAGNOSIS — R531 Weakness: Secondary | ICD-10-CM | POA: Diagnosis not present

## 2021-09-10 DIAGNOSIS — I10 Essential (primary) hypertension: Secondary | ICD-10-CM | POA: Diagnosis not present

## 2021-09-10 DIAGNOSIS — I63312 Cerebral infarction due to thrombosis of left middle cerebral artery: Secondary | ICD-10-CM | POA: Diagnosis not present

## 2021-09-10 DIAGNOSIS — I7090 Unspecified atherosclerosis: Secondary | ICD-10-CM | POA: Diagnosis not present

## 2021-09-10 DIAGNOSIS — T148XXS Other injury of unspecified body region, sequela: Secondary | ICD-10-CM | POA: Diagnosis not present

## 2021-09-10 DIAGNOSIS — G8331 Monoplegia, unspecified affecting right dominant side: Secondary | ICD-10-CM | POA: Diagnosis not present

## 2021-09-10 DIAGNOSIS — Z66 Do not resuscitate: Secondary | ICD-10-CM | POA: Diagnosis not present

## 2021-09-10 DIAGNOSIS — R569 Unspecified convulsions: Secondary | ICD-10-CM | POA: Diagnosis not present

## 2021-09-10 DIAGNOSIS — Z4789 Encounter for other orthopedic aftercare: Secondary | ICD-10-CM | POA: Diagnosis not present

## 2021-09-10 LAB — CBC WITH DIFFERENTIAL/PLATELET
Abs Immature Granulocytes: 0.05 10*3/uL (ref 0.00–0.07)
Basophils Absolute: 0.1 10*3/uL (ref 0.0–0.1)
Basophils Relative: 1 %
Eosinophils Absolute: 0.7 10*3/uL — ABNORMAL HIGH (ref 0.0–0.5)
Eosinophils Relative: 6 %
HCT: 27.7 % — ABNORMAL LOW (ref 36.0–46.0)
Hemoglobin: 8.9 g/dL — ABNORMAL LOW (ref 12.0–15.0)
Immature Granulocytes: 0 %
Lymphocytes Relative: 11 %
Lymphs Abs: 1.2 10*3/uL (ref 0.7–4.0)
MCH: 30 pg (ref 26.0–34.0)
MCHC: 32.1 g/dL (ref 30.0–36.0)
MCV: 93.3 fL (ref 80.0–100.0)
Monocytes Absolute: 0.9 10*3/uL (ref 0.1–1.0)
Monocytes Relative: 8 %
Neutro Abs: 8.3 10*3/uL — ABNORMAL HIGH (ref 1.7–7.7)
Neutrophils Relative %: 74 %
Platelets: 386 10*3/uL (ref 150–400)
RBC: 2.97 MIL/uL — ABNORMAL LOW (ref 3.87–5.11)
RDW: 14.2 % (ref 11.5–15.5)
WBC: 11.3 10*3/uL — ABNORMAL HIGH (ref 4.0–10.5)
nRBC: 0 % (ref 0.0–0.2)

## 2021-09-10 LAB — BASIC METABOLIC PANEL
Anion gap: 8 (ref 5–15)
BUN: 12 mg/dL (ref 8–23)
CO2: 25 mmol/L (ref 22–32)
Calcium: 9.6 mg/dL (ref 8.9–10.3)
Chloride: 102 mmol/L (ref 98–111)
Creatinine, Ser: 0.74 mg/dL (ref 0.44–1.00)
GFR, Estimated: >60 mL/min (ref >60)
Glucose, Bld: 94 mg/dL (ref 70–99)
Potassium: 3.4 mmol/L — ABNORMAL LOW (ref 3.5–5.1)
Sodium: 135 mmol/L (ref 135–145)

## 2021-09-10 LAB — MAGNESIUM: Magnesium: 1.8 mg/dL (ref 1.7–2.4)

## 2021-09-10 MED ORDER — OYSTER SHELL CALCIUM/D3 500-5 MG-MCG PO TABS
1.0000 | ORAL_TABLET | Freq: Two times a day (BID) | ORAL | Status: DC
  Filled 2021-09-10 (×2): qty 1

## 2021-09-10 MED ORDER — TAMSULOSIN HCL 0.4 MG PO CAPS: 0.4000 mg | ORAL_CAPSULE | Freq: Every day | ORAL | Status: DC

## 2021-09-10 MED ORDER — ASPIRIN 81 MG PO CHEW
81.0000 mg | CHEWABLE_TABLET | Freq: Two times a day (BID) | ORAL | Status: DC
  Filled 2021-09-10 (×2): qty 1

## 2021-09-10 MED ORDER — LISINOPRIL 20 MG PO TABS
20.0000 mg | ORAL_TABLET | Freq: Two times a day (BID) | ORAL | Status: DC
  Filled 2021-09-10: qty 1

## 2021-09-10 MED ORDER — TAMSULOSIN HCL 0.4 MG PO CAPS
0.4000 mg | ORAL_CAPSULE | Freq: Every day | ORAL | Status: DC
  Administered 2021-09-10: 0.4 mg via ORAL
  Filled 2021-09-10: qty 1

## 2021-09-10 MED ORDER — FERROUS SULFATE 325 (65 FE) MG PO TABS: 325.0000 mg | ORAL_TABLET | Freq: Every day | ORAL | 0 refills | Status: DC

## 2021-09-10 MED ORDER — POTASSIUM CHLORIDE 10 MEQ/100ML IV SOLN
10.0000 meq | INTRAVENOUS | Status: AC
  Administered 2021-09-10 (×3): 10 meq via INTRAVENOUS
  Filled 2021-09-10 (×3): qty 100

## 2021-09-10 MED ORDER — PRAVASTATIN SODIUM 20 MG PO TABS
40.0000 mg | ORAL_TABLET | Freq: Every day | ORAL | Status: DC
  Filled 2021-09-10: qty 2

## 2021-09-10 MED ORDER — VITAMIN B-12 1000 MCG PO TABS
1000.0000 ug | ORAL_TABLET | Freq: Every day | ORAL | Status: DC
  Administered 2021-09-10: 1000 ug via ORAL
  Filled 2021-09-10: qty 1

## 2021-09-10 MED ORDER — CYANOCOBALAMIN 1000 MCG PO TABS: 1000.0000 ug | ORAL_TABLET | Freq: Every day | ORAL | 0 refills | Status: DC

## 2021-09-10 NOTE — Plan of Care (Signed)
Patient discharged at this time per MD orders.All discharge instructions,education and medications reviewed with the patient & daughter at the bedside.Pt expressed understanding and will comply with dc instructions.follow up appointments was also communicated to the patient.no verbal c/o or any ssx of distress.patient was discharged to the [facility] for STR/PT/OT services per order.report was called in to floor nurse,Michael Lavieri before discharge.patient was transported by daughter in a privately owned vehicle.

## 2021-09-10 NOTE — TOC Progression Note (Addendum)
[facility]) - Progression Note    Patient Details  Name: Kellie Harris MRN: [id number] Date of Birth: 1930/09/01  Transition of Care [facility]) CM/SW [place], RN Phone Number: 09/10/2021, 10:20 AM  Clinical Narrative:   Patient to be discharged to [facility] today via EMS  [facility] of [facility].  Addendum:  patient's daughter will transport her back, as health team advantage denied EMS.    Expected Discharge Plan: [place] Barriers to Discharge: Continued Medical Work up  Expected Discharge Plan and Services Expected Discharge Plan: [place] Choice: [place] arrangements for the past 2 months: Single Family Home Expected Discharge Date: 09/10/21                                     Social Determinants of Health (SDOH) Interventions    Readmission Risk Interventions No flowsheet data found.

## 2021-09-10 NOTE — Discharge Summary (Addendum)
Physician Discharge Summary  Patient ID: Kellie Harris MRN: [id number] DOB/AGE: Feb 28, 1931 88 y.o.  Admit date: 09/07/2021 Discharge date: 09/10/2021  Admission Diagnoses:  Discharge Diagnoses:  Principal Problem:   Syncope Active Problems:   Closed left hip fracture (HCC)   Essential hypertension   HLD (hyperlipidemia)   History of CVA (cerebrovascular accident)   Leukocytosis   Hypokalemia   Hyponatremia   Acute blood loss anemia   Discharged Condition: good  Hospital Course:  EDELMIRA GALLOGLY is a 88 y.o. female with medical history significant of hypertension, hyperlipidemia, stroke, IBS, skin cancer, diverticulosis, pancreatic insufficiency, hard of hearing, who presents with syncope. Patient was orthostatic, she was given fluids.  Her hemoglobin is low at 7.5.  Syncope and collapse  Orthostatic hypotension. Essential hypertension. Appear to be secondary to dehydration.  Patient received fluids.  Blood pressures running high again, resume all home blood pressure medicines.   Acute blood loss anemia. Iron deficient anemia. Recent left hip surgery. Anemia appears to be secondary to recent bleeding from hip surgery.  Hemoglobin dropped down to 7.5.  She also had a significant iron deficiency, received 1 dose IV iron. Will continue oral iron supplements. Patient also received 2 doses injected B12, B12 level was borderline, homocystine level is still pending.  Please follow-up on results, I have prescribed B12, please continue if homocystine level is elevated.   Hyponatremia Hypokalemia Condition improving, will give another dose of IV potassium.  Current potassium is 3.4. Please check a BMP in 1 week time.  Urinary tension. Overflow incontinence. Patient had a residual of over 800 mL, she has a chronic incontinence.  Will place Foley catheter, schedule appointment with urology.  We will start Flomax.    Consults: None  Significant Diagnostic Studies:   Treatments: IVF,  IV iron, b12  Discharge Exam: Blood pressure (!) 155/66, pulse (!) 101, temperature 97.9 F (36.6 C), resp. rate 16, height 4\' 11"  (1.499 m), weight 43.1 kg, SpO2 99 %. General appearance: alert and cooperative Resp: clear to auscultation bilaterally Cardio: regular rate and rhythm, S1, S2 normal, no murmur, click, rub or gallop GI: soft, non-tender; bowel sounds normal; no masses,  no organomegaly Extremities: extremities normal, atraumatic, no cyanosis or edema  Disposition: Discharge disposition: 01-Home or Self Care       Discharge Instructions     Diet - low sodium heart healthy   Complete by: As directed    Discharge wound care:   Complete by: As directed    Follow with RN   Increase activity slowly   Complete by: As directed       Allergies as of 09/10/2021   No Known Allergies      Medication List     STOP taking these medications    celecoxib 200 MG capsule Commonly known as: CELEBREX   docusate sodium 100 MG capsule Commonly known as: COLACE   HYDROcodone-acetaminophen 5-325 MG tablet Commonly known as: NORCO/VICODIN   multivitamin capsule       TAKE these medications    acetaminophen 500 MG tablet Commonly known as: TYLENOL Take 1,000 mg by mouth every 8 (eight) hours as needed for mild pain or moderate pain.   amLODipine 5 MG tablet Commonly known as: NORVASC Take 5 mg by mouth daily.   aspirin 81 MG chewable tablet Chew 1 tablet (81 mg total) by mouth 2 (two) times daily.   cyanocobalamin 1000 MCG tablet Take 1 tablet (1,000 mcg total) by mouth daily.   ferrous  sulfate 325 (65 FE) MG tablet Take 1 tablet (325 mg total) by mouth daily.   lisinopril 20 MG tablet Commonly known as: ZESTRIL Take 1 tablet by mouth 2 (two) times daily.   metoprolol succinate 25 MG 24 hr tablet Commonly known as: TOPROL-XL Take 12.5 mg by mouth daily.   pravastatin 40 MG tablet Commonly known as: PRAVACHOL Take 40 mg by mouth at bedtime.    Vitamin D 50 MCG (2000 UT) tablet Take 2,000 Units by mouth daily.               Discharge Care Instructions  (From admission, onward)           Start     Ordered   09/10/21 0000  Discharge wound care:       Comments: Follow with RN   09/10/21 4656            Contact information for follow-up providers     Dion Body, MD Follow up in 1 week(s).   Specialty: Family Medicine Contact information: [place] [address] [phone]              Contact information for after-discharge care     Destination     [facility] Preferred SNF .   Service: Skilled Nursing Contact information: [address] [place] [phone]                     Signed: Sharen Hones 09/10/2021, 9:55 AM

## 2021-09-11 DIAGNOSIS — I6523 Occlusion and stenosis of bilateral carotid arteries: Secondary | ICD-10-CM | POA: Diagnosis not present

## 2021-09-11 DIAGNOSIS — I951 Orthostatic hypotension: Secondary | ICD-10-CM | POA: Diagnosis not present

## 2021-09-11 DIAGNOSIS — I10 Essential (primary) hypertension: Secondary | ICD-10-CM | POA: Diagnosis not present

## 2021-09-11 DIAGNOSIS — R0902 Hypoxemia: Secondary | ICD-10-CM | POA: Diagnosis not present

## 2021-09-11 DIAGNOSIS — I69392 Facial weakness following cerebral infarction: Secondary | ICD-10-CM | POA: Diagnosis not present

## 2021-09-11 DIAGNOSIS — I6522 Occlusion and stenosis of left carotid artery: Secondary | ICD-10-CM | POA: Diagnosis not present

## 2021-09-11 DIAGNOSIS — I63312 Cerebral infarction due to thrombosis of left middle cerebral artery: Secondary | ICD-10-CM | POA: Diagnosis not present

## 2021-09-11 DIAGNOSIS — I63512 Cerebral infarction due to unspecified occlusion or stenosis of left middle cerebral artery: Secondary | ICD-10-CM | POA: Diagnosis not present

## 2021-09-11 DIAGNOSIS — R4781 Slurred speech: Secondary | ICD-10-CM | POA: Diagnosis not present

## 2021-09-11 DIAGNOSIS — R339 Retention of urine, unspecified: Secondary | ICD-10-CM | POA: Diagnosis not present

## 2021-09-11 DIAGNOSIS — E876 Hypokalemia: Secondary | ICD-10-CM | POA: Diagnosis not present

## 2021-09-11 DIAGNOSIS — Z7982 Long term (current) use of aspirin: Secondary | ICD-10-CM | POA: Diagnosis not present

## 2021-09-11 DIAGNOSIS — I672 Cerebral atherosclerosis: Secondary | ICD-10-CM | POA: Diagnosis not present

## 2021-09-11 DIAGNOSIS — Z66 Do not resuscitate: Secondary | ICD-10-CM | POA: Diagnosis not present

## 2021-09-11 DIAGNOSIS — I639 Cerebral infarction, unspecified: Secondary | ICD-10-CM | POA: Diagnosis not present

## 2021-09-11 DIAGNOSIS — R55 Syncope and collapse: Secondary | ICD-10-CM | POA: Diagnosis not present

## 2021-09-11 DIAGNOSIS — E569 Vitamin deficiency, unspecified: Secondary | ICD-10-CM | POA: Diagnosis not present

## 2021-09-11 DIAGNOSIS — Z7902 Long term (current) use of antithrombotics/antiplatelets: Secondary | ICD-10-CM | POA: Diagnosis not present

## 2021-09-11 DIAGNOSIS — M6281 Muscle weakness (generalized): Secondary | ICD-10-CM | POA: Diagnosis not present

## 2021-09-11 DIAGNOSIS — R0600 Dyspnea, unspecified: Secondary | ICD-10-CM | POA: Diagnosis not present

## 2021-09-11 DIAGNOSIS — R29810 Facial weakness: Secondary | ICD-10-CM | POA: Diagnosis not present

## 2021-09-11 DIAGNOSIS — E119 Type 2 diabetes mellitus without complications: Secondary | ICD-10-CM | POA: Diagnosis not present

## 2021-09-11 DIAGNOSIS — R000 Tachycardia, unspecified: Secondary | ICD-10-CM | POA: Diagnosis not present

## 2021-09-11 DIAGNOSIS — R41841 Cognitive communication deficit: Secondary | ICD-10-CM | POA: Diagnosis not present

## 2021-09-11 DIAGNOSIS — R52 Pain, unspecified: Secondary | ICD-10-CM | POA: Diagnosis not present

## 2021-09-11 DIAGNOSIS — I69320 Aphasia following cerebral infarction: Secondary | ICD-10-CM | POA: Diagnosis not present

## 2021-09-11 DIAGNOSIS — K588 Other irritable bowel syndrome: Secondary | ICD-10-CM | POA: Diagnosis not present

## 2021-09-11 DIAGNOSIS — I7090 Unspecified atherosclerosis: Secondary | ICD-10-CM | POA: Diagnosis not present

## 2021-09-11 DIAGNOSIS — Z4789 Encounter for other orthopedic aftercare: Secondary | ICD-10-CM | POA: Diagnosis not present

## 2021-09-11 DIAGNOSIS — R531 Weakness: Secondary | ICD-10-CM | POA: Diagnosis not present

## 2021-09-11 DIAGNOSIS — M1612 Unilateral primary osteoarthritis, left hip: Secondary | ICD-10-CM | POA: Diagnosis not present

## 2021-09-11 DIAGNOSIS — R29704 NIHSS score 4: Secondary | ICD-10-CM | POA: Diagnosis not present

## 2021-09-11 DIAGNOSIS — R569 Unspecified convulsions: Secondary | ICD-10-CM | POA: Diagnosis not present

## 2021-09-11 DIAGNOSIS — R4701 Aphasia: Secondary | ICD-10-CM | POA: Diagnosis not present

## 2021-09-11 DIAGNOSIS — Z736 Limitation of activities due to disability: Secondary | ICD-10-CM | POA: Diagnosis not present

## 2021-09-11 DIAGNOSIS — E871 Hypo-osmolality and hyponatremia: Secondary | ICD-10-CM | POA: Diagnosis not present

## 2021-09-11 DIAGNOSIS — Z20822 Contact with and (suspected) exposure to covid-19: Secondary | ICD-10-CM | POA: Diagnosis not present

## 2021-09-11 DIAGNOSIS — I351 Nonrheumatic aortic (valve) insufficiency: Secondary | ICD-10-CM | POA: Diagnosis not present

## 2021-09-11 DIAGNOSIS — Z96642 Presence of left artificial hip joint: Secondary | ICD-10-CM | POA: Diagnosis not present

## 2021-09-11 DIAGNOSIS — D62 Acute posthemorrhagic anemia: Secondary | ICD-10-CM | POA: Diagnosis not present

## 2021-09-11 DIAGNOSIS — M6259 Muscle wasting and atrophy, not elsewhere classified, multiple sites: Secondary | ICD-10-CM | POA: Diagnosis not present

## 2021-09-11 DIAGNOSIS — I671 Cerebral aneurysm, nonruptured: Secondary | ICD-10-CM | POA: Diagnosis not present

## 2021-09-11 DIAGNOSIS — Z8673 Personal history of transient ischemic attack (TIA), and cerebral infarction without residual deficits: Secondary | ICD-10-CM | POA: Diagnosis not present

## 2021-09-11 DIAGNOSIS — D509 Iron deficiency anemia, unspecified: Secondary | ICD-10-CM | POA: Diagnosis not present

## 2021-09-11 DIAGNOSIS — E785 Hyperlipidemia, unspecified: Secondary | ICD-10-CM | POA: Diagnosis not present

## 2021-09-11 DIAGNOSIS — G8331 Monoplegia, unspecified affecting right dominant side: Secondary | ICD-10-CM | POA: Diagnosis not present

## 2021-09-11 DIAGNOSIS — R0689 Other abnormalities of breathing: Secondary | ICD-10-CM | POA: Diagnosis not present

## 2021-09-11 DIAGNOSIS — I2510 Atherosclerotic heart disease of native coronary artery without angina pectoris: Secondary | ICD-10-CM | POA: Diagnosis not present

## 2021-09-11 DIAGNOSIS — R278 Other lack of coordination: Secondary | ICD-10-CM | POA: Diagnosis not present

## 2021-09-12 DIAGNOSIS — R0600 Dyspnea, unspecified: Secondary | ICD-10-CM | POA: Diagnosis not present

## 2021-09-12 DIAGNOSIS — I672 Cerebral atherosclerosis: Secondary | ICD-10-CM | POA: Diagnosis not present

## 2021-09-12 DIAGNOSIS — D62 Acute posthemorrhagic anemia: Secondary | ICD-10-CM | POA: Diagnosis not present

## 2021-09-12 DIAGNOSIS — I10 Essential (primary) hypertension: Secondary | ICD-10-CM | POA: Diagnosis not present

## 2021-09-12 DIAGNOSIS — I63512 Cerebral infarction due to unspecified occlusion or stenosis of left middle cerebral artery: Secondary | ICD-10-CM | POA: Diagnosis not present

## 2021-09-12 DIAGNOSIS — I671 Cerebral aneurysm, nonruptured: Secondary | ICD-10-CM | POA: Diagnosis not present

## 2021-09-12 LAB — HOMOCYSTEINE: Homocysteine: 22.5 umol/L — ABNORMAL HIGH (ref 0.0–21.3)

## 2021-09-13 DIAGNOSIS — I351 Nonrheumatic aortic (valve) insufficiency: Secondary | ICD-10-CM | POA: Diagnosis not present

## 2021-09-13 DIAGNOSIS — R55 Syncope and collapse: Secondary | ICD-10-CM | POA: Diagnosis not present

## 2021-09-14 DIAGNOSIS — I639 Cerebral infarction, unspecified: Secondary | ICD-10-CM | POA: Diagnosis not present

## 2021-09-18 DIAGNOSIS — I10 Essential (primary) hypertension: Secondary | ICD-10-CM | POA: Diagnosis not present

## 2021-09-18 DIAGNOSIS — I679 Cerebrovascular disease, unspecified: Secondary | ICD-10-CM | POA: Diagnosis not present

## 2021-09-18 DIAGNOSIS — R41841 Cognitive communication deficit: Secondary | ICD-10-CM | POA: Diagnosis not present

## 2021-09-18 DIAGNOSIS — D72829 Elevated white blood cell count, unspecified: Secondary | ICD-10-CM | POA: Diagnosis not present

## 2021-09-18 DIAGNOSIS — I639 Cerebral infarction, unspecified: Secondary | ICD-10-CM | POA: Diagnosis not present

## 2021-09-18 DIAGNOSIS — K5900 Constipation, unspecified: Secondary | ICD-10-CM | POA: Diagnosis not present

## 2021-09-18 DIAGNOSIS — K588 Other irritable bowel syndrome: Secondary | ICD-10-CM | POA: Diagnosis not present

## 2021-09-18 DIAGNOSIS — Z736 Limitation of activities due to disability: Secondary | ICD-10-CM | POA: Diagnosis not present

## 2021-09-18 DIAGNOSIS — E611 Iron deficiency: Secondary | ICD-10-CM | POA: Diagnosis not present

## 2021-09-18 DIAGNOSIS — M1612 Unilateral primary osteoarthritis, left hip: Secondary | ICD-10-CM | POA: Diagnosis not present

## 2021-09-18 DIAGNOSIS — N390 Urinary tract infection, site not specified: Secondary | ICD-10-CM | POA: Diagnosis not present

## 2021-09-18 DIAGNOSIS — M2550 Pain in unspecified joint: Secondary | ICD-10-CM | POA: Diagnosis not present

## 2021-09-18 DIAGNOSIS — R569 Unspecified convulsions: Secondary | ICD-10-CM | POA: Diagnosis not present

## 2021-09-18 DIAGNOSIS — E871 Hypo-osmolality and hyponatremia: Secondary | ICD-10-CM | POA: Diagnosis not present

## 2021-09-18 DIAGNOSIS — I2510 Atherosclerotic heart disease of native coronary artery without angina pectoris: Secondary | ICD-10-CM | POA: Diagnosis not present

## 2021-09-18 DIAGNOSIS — R278 Other lack of coordination: Secondary | ICD-10-CM | POA: Diagnosis not present

## 2021-09-18 DIAGNOSIS — E569 Vitamin deficiency, unspecified: Secondary | ICD-10-CM | POA: Diagnosis not present

## 2021-09-18 DIAGNOSIS — I63512 Cerebral infarction due to unspecified occlusion or stenosis of left middle cerebral artery: Secondary | ICD-10-CM | POA: Diagnosis not present

## 2021-09-18 DIAGNOSIS — M6259 Muscle wasting and atrophy, not elsewhere classified, multiple sites: Secondary | ICD-10-CM | POA: Diagnosis not present

## 2021-09-18 DIAGNOSIS — I951 Orthostatic hypotension: Secondary | ICD-10-CM | POA: Diagnosis not present

## 2021-09-18 DIAGNOSIS — E785 Hyperlipidemia, unspecified: Secondary | ICD-10-CM | POA: Diagnosis not present

## 2021-09-18 DIAGNOSIS — Z471 Aftercare following joint replacement surgery: Secondary | ICD-10-CM | POA: Diagnosis not present

## 2021-09-18 DIAGNOSIS — Z4789 Encounter for other orthopedic aftercare: Secondary | ICD-10-CM | POA: Diagnosis not present

## 2021-09-18 DIAGNOSIS — R52 Pain, unspecified: Secondary | ICD-10-CM | POA: Diagnosis not present

## 2021-09-18 DIAGNOSIS — M25512 Pain in left shoulder: Secondary | ICD-10-CM | POA: Diagnosis not present

## 2021-09-18 DIAGNOSIS — D509 Iron deficiency anemia, unspecified: Secondary | ICD-10-CM | POA: Diagnosis not present

## 2021-09-18 DIAGNOSIS — M6281 Muscle weakness (generalized): Secondary | ICD-10-CM | POA: Diagnosis not present

## 2021-09-19 DIAGNOSIS — I679 Cerebrovascular disease, unspecified: Secondary | ICD-10-CM | POA: Diagnosis not present

## 2021-09-19 DIAGNOSIS — D72829 Elevated white blood cell count, unspecified: Secondary | ICD-10-CM | POA: Diagnosis not present

## 2021-09-19 DIAGNOSIS — M6281 Muscle weakness (generalized): Secondary | ICD-10-CM | POA: Diagnosis not present

## 2021-09-19 DIAGNOSIS — Z471 Aftercare following joint replacement surgery: Secondary | ICD-10-CM | POA: Diagnosis not present

## 2021-09-21 DIAGNOSIS — I639 Cerebral infarction, unspecified: Secondary | ICD-10-CM | POA: Diagnosis not present

## 2021-09-21 DIAGNOSIS — M25512 Pain in left shoulder: Secondary | ICD-10-CM | POA: Diagnosis not present

## 2021-09-22 DIAGNOSIS — I10 Essential (primary) hypertension: Secondary | ICD-10-CM | POA: Diagnosis not present

## 2021-09-25 DIAGNOSIS — Z471 Aftercare following joint replacement surgery: Secondary | ICD-10-CM | POA: Diagnosis not present

## 2021-09-25 DIAGNOSIS — M2550 Pain in unspecified joint: Secondary | ICD-10-CM | POA: Diagnosis not present

## 2021-09-25 DIAGNOSIS — D72829 Elevated white blood cell count, unspecified: Secondary | ICD-10-CM | POA: Diagnosis not present

## 2021-09-25 DIAGNOSIS — I679 Cerebrovascular disease, unspecified: Secondary | ICD-10-CM | POA: Diagnosis not present

## 2021-09-27 DIAGNOSIS — I10 Essential (primary) hypertension: Secondary | ICD-10-CM | POA: Diagnosis not present

## 2021-09-27 DIAGNOSIS — E611 Iron deficiency: Secondary | ICD-10-CM | POA: Diagnosis not present

## 2021-09-28 DIAGNOSIS — D72829 Elevated white blood cell count, unspecified: Secondary | ICD-10-CM | POA: Diagnosis not present

## 2021-09-28 DIAGNOSIS — D509 Iron deficiency anemia, unspecified: Secondary | ICD-10-CM | POA: Diagnosis not present

## 2021-09-28 DIAGNOSIS — M2550 Pain in unspecified joint: Secondary | ICD-10-CM | POA: Diagnosis not present

## 2021-09-28 DIAGNOSIS — K5900 Constipation, unspecified: Secondary | ICD-10-CM | POA: Diagnosis not present

## 2021-09-29 DIAGNOSIS — N390 Urinary tract infection, site not specified: Secondary | ICD-10-CM | POA: Diagnosis not present

## 2021-10-02 DIAGNOSIS — N390 Urinary tract infection, site not specified: Secondary | ICD-10-CM | POA: Diagnosis not present

## 2021-10-02 DIAGNOSIS — I10 Essential (primary) hypertension: Secondary | ICD-10-CM | POA: Diagnosis not present

## 2021-10-03 DIAGNOSIS — I10 Essential (primary) hypertension: Secondary | ICD-10-CM | POA: Diagnosis not present

## 2021-10-05 DIAGNOSIS — I10 Essential (primary) hypertension: Secondary | ICD-10-CM | POA: Diagnosis not present

## 2021-10-05 DIAGNOSIS — I679 Cerebrovascular disease, unspecified: Secondary | ICD-10-CM | POA: Diagnosis not present

## 2021-10-06 DIAGNOSIS — M6281 Muscle weakness (generalized): Secondary | ICD-10-CM | POA: Diagnosis not present

## 2021-10-06 DIAGNOSIS — I10 Essential (primary) hypertension: Secondary | ICD-10-CM | POA: Diagnosis not present

## 2021-10-06 DIAGNOSIS — I679 Cerebrovascular disease, unspecified: Secondary | ICD-10-CM | POA: Diagnosis not present

## 2021-10-06 DIAGNOSIS — Z471 Aftercare following joint replacement surgery: Secondary | ICD-10-CM | POA: Diagnosis not present

## 2021-10-10 DIAGNOSIS — K5790 Diverticulosis of intestine, part unspecified, without perforation or abscess without bleeding: Secondary | ICD-10-CM | POA: Diagnosis not present

## 2021-10-10 DIAGNOSIS — I129 Hypertensive chronic kidney disease with stage 1 through stage 4 chronic kidney disease, or unspecified chronic kidney disease: Secondary | ICD-10-CM | POA: Diagnosis not present

## 2021-10-10 DIAGNOSIS — E559 Vitamin D deficiency, unspecified: Secondary | ICD-10-CM | POA: Diagnosis not present

## 2021-10-10 DIAGNOSIS — M1991 Primary osteoarthritis, unspecified site: Secondary | ICD-10-CM | POA: Diagnosis not present

## 2021-10-10 DIAGNOSIS — Z7982 Long term (current) use of aspirin: Secondary | ICD-10-CM | POA: Diagnosis not present

## 2021-10-10 DIAGNOSIS — Z9181 History of falling: Secondary | ICD-10-CM | POA: Diagnosis not present

## 2021-10-10 DIAGNOSIS — I69351 Hemiplegia and hemiparesis following cerebral infarction affecting right dominant side: Secondary | ICD-10-CM | POA: Diagnosis not present

## 2021-10-10 DIAGNOSIS — K589 Irritable bowel syndrome without diarrhea: Secondary | ICD-10-CM | POA: Diagnosis not present

## 2021-10-10 DIAGNOSIS — N189 Chronic kidney disease, unspecified: Secondary | ICD-10-CM | POA: Diagnosis not present

## 2021-10-10 DIAGNOSIS — E785 Hyperlipidemia, unspecified: Secondary | ICD-10-CM | POA: Diagnosis not present

## 2021-10-10 DIAGNOSIS — H9190 Unspecified hearing loss, unspecified ear: Secondary | ICD-10-CM | POA: Diagnosis not present

## 2021-10-10 DIAGNOSIS — Z85828 Personal history of other malignant neoplasm of skin: Secondary | ICD-10-CM | POA: Diagnosis not present

## 2021-10-10 DIAGNOSIS — Z87440 Personal history of urinary (tract) infections: Secondary | ICD-10-CM | POA: Diagnosis not present

## 2021-10-10 DIAGNOSIS — M5136 Other intervertebral disc degeneration, lumbar region: Secondary | ICD-10-CM | POA: Diagnosis not present

## 2021-10-10 DIAGNOSIS — Z96651 Presence of right artificial knee joint: Secondary | ICD-10-CM | POA: Diagnosis not present

## 2021-10-10 DIAGNOSIS — Z96642 Presence of left artificial hip joint: Secondary | ICD-10-CM | POA: Diagnosis not present

## 2021-10-11 DIAGNOSIS — Z8679 Personal history of other diseases of the circulatory system: Secondary | ICD-10-CM | POA: Diagnosis not present

## 2021-10-11 DIAGNOSIS — Z09 Encounter for follow-up examination after completed treatment for conditions other than malignant neoplasm: Secondary | ICD-10-CM | POA: Diagnosis not present

## 2021-10-11 DIAGNOSIS — Z96642 Presence of left artificial hip joint: Secondary | ICD-10-CM | POA: Diagnosis not present

## 2021-10-12 DIAGNOSIS — Z8673 Personal history of transient ischemic attack (TIA), and cerebral infarction without residual deficits: Secondary | ICD-10-CM | POA: Diagnosis not present

## 2021-10-12 DIAGNOSIS — E782 Mixed hyperlipidemia: Secondary | ICD-10-CM | POA: Diagnosis not present

## 2021-10-12 DIAGNOSIS — I69351 Hemiplegia and hemiparesis following cerebral infarction affecting right dominant side: Secondary | ICD-10-CM | POA: Diagnosis not present

## 2021-10-12 DIAGNOSIS — Z7982 Long term (current) use of aspirin: Secondary | ICD-10-CM | POA: Diagnosis not present

## 2021-10-12 DIAGNOSIS — M5136 Other intervertebral disc degeneration, lumbar region: Secondary | ICD-10-CM | POA: Diagnosis not present

## 2021-10-12 DIAGNOSIS — Z96642 Presence of left artificial hip joint: Secondary | ICD-10-CM | POA: Diagnosis not present

## 2021-10-12 DIAGNOSIS — Z96651 Presence of right artificial knee joint: Secondary | ICD-10-CM | POA: Diagnosis not present

## 2021-10-12 DIAGNOSIS — M1991 Primary osteoarthritis, unspecified site: Secondary | ICD-10-CM | POA: Diagnosis not present

## 2021-10-12 DIAGNOSIS — H9190 Unspecified hearing loss, unspecified ear: Secondary | ICD-10-CM | POA: Diagnosis not present

## 2021-10-12 DIAGNOSIS — E785 Hyperlipidemia, unspecified: Secondary | ICD-10-CM | POA: Diagnosis not present

## 2021-10-12 DIAGNOSIS — E559 Vitamin D deficiency, unspecified: Secondary | ICD-10-CM | POA: Diagnosis not present

## 2021-10-12 DIAGNOSIS — E213 Hyperparathyroidism, unspecified: Secondary | ICD-10-CM | POA: Diagnosis not present

## 2021-10-12 DIAGNOSIS — Z87440 Personal history of urinary (tract) infections: Secondary | ICD-10-CM | POA: Diagnosis not present

## 2021-10-12 DIAGNOSIS — I129 Hypertensive chronic kidney disease with stage 1 through stage 4 chronic kidney disease, or unspecified chronic kidney disease: Secondary | ICD-10-CM | POA: Diagnosis not present

## 2021-10-12 DIAGNOSIS — N189 Chronic kidney disease, unspecified: Secondary | ICD-10-CM | POA: Diagnosis not present

## 2021-10-12 DIAGNOSIS — K5790 Diverticulosis of intestine, part unspecified, without perforation or abscess without bleeding: Secondary | ICD-10-CM | POA: Diagnosis not present

## 2021-10-12 DIAGNOSIS — I10 Essential (primary) hypertension: Secondary | ICD-10-CM | POA: Diagnosis not present

## 2021-10-12 DIAGNOSIS — Z85828 Personal history of other malignant neoplasm of skin: Secondary | ICD-10-CM | POA: Diagnosis not present

## 2021-10-12 DIAGNOSIS — K589 Irritable bowel syndrome without diarrhea: Secondary | ICD-10-CM | POA: Diagnosis not present

## 2021-10-12 DIAGNOSIS — Z9181 History of falling: Secondary | ICD-10-CM | POA: Diagnosis not present

## 2021-10-16 DIAGNOSIS — Z96642 Presence of left artificial hip joint: Secondary | ICD-10-CM | POA: Diagnosis not present

## 2021-10-17 DIAGNOSIS — M1991 Primary osteoarthritis, unspecified site: Secondary | ICD-10-CM | POA: Diagnosis not present

## 2021-10-17 DIAGNOSIS — E559 Vitamin D deficiency, unspecified: Secondary | ICD-10-CM | POA: Diagnosis not present

## 2021-10-17 DIAGNOSIS — N189 Chronic kidney disease, unspecified: Secondary | ICD-10-CM | POA: Diagnosis not present

## 2021-10-17 DIAGNOSIS — M5136 Other intervertebral disc degeneration, lumbar region: Secondary | ICD-10-CM | POA: Diagnosis not present

## 2021-10-17 DIAGNOSIS — I69351 Hemiplegia and hemiparesis following cerebral infarction affecting right dominant side: Secondary | ICD-10-CM | POA: Diagnosis not present

## 2021-10-17 DIAGNOSIS — H9190 Unspecified hearing loss, unspecified ear: Secondary | ICD-10-CM | POA: Diagnosis not present

## 2021-10-17 DIAGNOSIS — I129 Hypertensive chronic kidney disease with stage 1 through stage 4 chronic kidney disease, or unspecified chronic kidney disease: Secondary | ICD-10-CM | POA: Diagnosis not present

## 2021-10-20 DIAGNOSIS — Z9181 History of falling: Secondary | ICD-10-CM | POA: Diagnosis not present

## 2021-10-20 DIAGNOSIS — E559 Vitamin D deficiency, unspecified: Secondary | ICD-10-CM | POA: Diagnosis not present

## 2021-10-20 DIAGNOSIS — E785 Hyperlipidemia, unspecified: Secondary | ICD-10-CM | POA: Diagnosis not present

## 2021-10-20 DIAGNOSIS — Z87440 Personal history of urinary (tract) infections: Secondary | ICD-10-CM | POA: Diagnosis not present

## 2021-10-20 DIAGNOSIS — K5790 Diverticulosis of intestine, part unspecified, without perforation or abscess without bleeding: Secondary | ICD-10-CM | POA: Diagnosis not present

## 2021-10-20 DIAGNOSIS — Z96642 Presence of left artificial hip joint: Secondary | ICD-10-CM | POA: Diagnosis not present

## 2021-10-20 DIAGNOSIS — N189 Chronic kidney disease, unspecified: Secondary | ICD-10-CM | POA: Diagnosis not present

## 2021-10-20 DIAGNOSIS — K589 Irritable bowel syndrome without diarrhea: Secondary | ICD-10-CM | POA: Diagnosis not present

## 2021-10-20 DIAGNOSIS — I69351 Hemiplegia and hemiparesis following cerebral infarction affecting right dominant side: Secondary | ICD-10-CM | POA: Diagnosis not present

## 2021-10-20 DIAGNOSIS — M5136 Other intervertebral disc degeneration, lumbar region: Secondary | ICD-10-CM | POA: Diagnosis not present

## 2021-10-20 DIAGNOSIS — M1991 Primary osteoarthritis, unspecified site: Secondary | ICD-10-CM | POA: Diagnosis not present

## 2021-10-20 DIAGNOSIS — Z96651 Presence of right artificial knee joint: Secondary | ICD-10-CM | POA: Diagnosis not present

## 2021-10-20 DIAGNOSIS — Z7982 Long term (current) use of aspirin: Secondary | ICD-10-CM | POA: Diagnosis not present

## 2021-10-20 DIAGNOSIS — I129 Hypertensive chronic kidney disease with stage 1 through stage 4 chronic kidney disease, or unspecified chronic kidney disease: Secondary | ICD-10-CM | POA: Diagnosis not present

## 2021-10-20 DIAGNOSIS — H9190 Unspecified hearing loss, unspecified ear: Secondary | ICD-10-CM | POA: Diagnosis not present

## 2021-10-20 DIAGNOSIS — Z85828 Personal history of other malignant neoplasm of skin: Secondary | ICD-10-CM | POA: Diagnosis not present

## 2021-11-10 DIAGNOSIS — M19012 Primary osteoarthritis, left shoulder: Secondary | ICD-10-CM | POA: Diagnosis not present

## 2021-11-10 DIAGNOSIS — M19011 Primary osteoarthritis, right shoulder: Secondary | ICD-10-CM | POA: Diagnosis not present

## 2021-11-10 DIAGNOSIS — Z96641 Presence of right artificial hip joint: Secondary | ICD-10-CM | POA: Diagnosis not present

## 2021-11-10 DIAGNOSIS — I69351 Hemiplegia and hemiparesis following cerebral infarction affecting right dominant side: Secondary | ICD-10-CM | POA: Diagnosis not present

## 2021-11-10 DIAGNOSIS — I10 Essential (primary) hypertension: Secondary | ICD-10-CM | POA: Diagnosis not present

## 2021-11-10 DIAGNOSIS — Z96653 Presence of artificial knee joint, bilateral: Secondary | ICD-10-CM | POA: Diagnosis not present

## 2021-11-14 DIAGNOSIS — E785 Hyperlipidemia, unspecified: Secondary | ICD-10-CM | POA: Diagnosis not present

## 2021-11-14 DIAGNOSIS — I69351 Hemiplegia and hemiparesis following cerebral infarction affecting right dominant side: Secondary | ICD-10-CM | POA: Diagnosis not present

## 2021-11-14 DIAGNOSIS — Z9181 History of falling: Secondary | ICD-10-CM | POA: Diagnosis not present

## 2021-11-14 DIAGNOSIS — Z96642 Presence of left artificial hip joint: Secondary | ICD-10-CM | POA: Diagnosis not present

## 2021-11-14 DIAGNOSIS — Z87440 Personal history of urinary (tract) infections: Secondary | ICD-10-CM | POA: Diagnosis not present

## 2021-11-14 DIAGNOSIS — M5136 Other intervertebral disc degeneration, lumbar region: Secondary | ICD-10-CM | POA: Diagnosis not present

## 2021-11-14 DIAGNOSIS — E559 Vitamin D deficiency, unspecified: Secondary | ICD-10-CM | POA: Diagnosis not present

## 2021-11-14 DIAGNOSIS — Z96651 Presence of right artificial knee joint: Secondary | ICD-10-CM | POA: Diagnosis not present

## 2021-11-14 DIAGNOSIS — K5790 Diverticulosis of intestine, part unspecified, without perforation or abscess without bleeding: Secondary | ICD-10-CM | POA: Diagnosis not present

## 2021-11-14 DIAGNOSIS — I129 Hypertensive chronic kidney disease with stage 1 through stage 4 chronic kidney disease, or unspecified chronic kidney disease: Secondary | ICD-10-CM | POA: Diagnosis not present

## 2021-11-14 DIAGNOSIS — Z7982 Long term (current) use of aspirin: Secondary | ICD-10-CM | POA: Diagnosis not present

## 2021-11-14 DIAGNOSIS — K589 Irritable bowel syndrome without diarrhea: Secondary | ICD-10-CM | POA: Diagnosis not present

## 2021-11-14 DIAGNOSIS — Z85828 Personal history of other malignant neoplasm of skin: Secondary | ICD-10-CM | POA: Diagnosis not present

## 2021-11-14 DIAGNOSIS — M1991 Primary osteoarthritis, unspecified site: Secondary | ICD-10-CM | POA: Diagnosis not present

## 2021-11-14 DIAGNOSIS — N189 Chronic kidney disease, unspecified: Secondary | ICD-10-CM | POA: Diagnosis not present

## 2021-11-14 DIAGNOSIS — H9190 Unspecified hearing loss, unspecified ear: Secondary | ICD-10-CM | POA: Diagnosis not present

## 2021-12-13 DIAGNOSIS — M13862 Other specified arthritis, left knee: Secondary | ICD-10-CM | POA: Diagnosis not present

## 2021-12-13 DIAGNOSIS — M25562 Pain in left knee: Secondary | ICD-10-CM | POA: Diagnosis not present

## 2021-12-22 DIAGNOSIS — M1712 Unilateral primary osteoarthritis, left knee: Secondary | ICD-10-CM | POA: Diagnosis not present

## 2022-01-03 DIAGNOSIS — E782 Mixed hyperlipidemia: Secondary | ICD-10-CM | POA: Diagnosis not present

## 2022-01-10 DIAGNOSIS — R748 Abnormal levels of other serum enzymes: Secondary | ICD-10-CM | POA: Diagnosis not present

## 2022-01-10 DIAGNOSIS — I10 Essential (primary) hypertension: Secondary | ICD-10-CM | POA: Diagnosis not present

## 2022-01-10 DIAGNOSIS — E782 Mixed hyperlipidemia: Secondary | ICD-10-CM | POA: Diagnosis not present

## 2022-01-10 DIAGNOSIS — Z8673 Personal history of transient ischemic attack (TIA), and cerebral infarction without residual deficits: Secondary | ICD-10-CM | POA: Diagnosis not present

## 2022-01-10 DIAGNOSIS — M810 Age-related osteoporosis without current pathological fracture: Secondary | ICD-10-CM | POA: Diagnosis not present

## 2022-01-23 DIAGNOSIS — M1712 Unilateral primary osteoarthritis, left knee: Secondary | ICD-10-CM | POA: Diagnosis not present

## 2022-02-07 DIAGNOSIS — E041 Nontoxic single thyroid nodule: Secondary | ICD-10-CM | POA: Diagnosis not present

## 2022-02-07 DIAGNOSIS — M810 Age-related osteoporosis without current pathological fracture: Secondary | ICD-10-CM | POA: Diagnosis not present

## 2022-02-07 DIAGNOSIS — E559 Vitamin D deficiency, unspecified: Secondary | ICD-10-CM | POA: Diagnosis not present

## 2022-02-28 DIAGNOSIS — M810 Age-related osteoporosis without current pathological fracture: Secondary | ICD-10-CM | POA: Diagnosis not present

## 2022-03-02 DIAGNOSIS — M19011 Primary osteoarthritis, right shoulder: Secondary | ICD-10-CM | POA: Diagnosis not present

## 2022-03-02 DIAGNOSIS — M19012 Primary osteoarthritis, left shoulder: Secondary | ICD-10-CM | POA: Diagnosis not present

## 2022-03-16 DIAGNOSIS — M159 Polyosteoarthritis, unspecified: Secondary | ICD-10-CM | POA: Diagnosis not present

## 2022-03-16 DIAGNOSIS — I10 Essential (primary) hypertension: Secondary | ICD-10-CM | POA: Diagnosis not present

## 2022-03-16 DIAGNOSIS — M810 Age-related osteoporosis without current pathological fracture: Secondary | ICD-10-CM | POA: Diagnosis not present

## 2022-03-16 DIAGNOSIS — Z96649 Presence of unspecified artificial hip joint: Secondary | ICD-10-CM | POA: Diagnosis not present

## 2022-03-26 DIAGNOSIS — E559 Vitamin D deficiency, unspecified: Secondary | ICD-10-CM | POA: Diagnosis not present

## 2022-03-26 DIAGNOSIS — M810 Age-related osteoporosis without current pathological fracture: Secondary | ICD-10-CM | POA: Diagnosis not present

## 2022-05-11 DIAGNOSIS — H40003 Preglaucoma, unspecified, bilateral: Secondary | ICD-10-CM | POA: Diagnosis not present

## 2022-06-01 ENCOUNTER — Emergency Department: Payer: PPO

## 2022-06-01 ENCOUNTER — Emergency Department
Admission: EM | Admit: 2022-06-01 | Discharge: 2022-06-01 | Disposition: A | Discharge status: Home / Self Care | Payer: PPO | Attending: Emergency Medicine | Admitting: Emergency Medicine

## 2022-06-01 ENCOUNTER — Other Ambulatory Visit: Payer: Self-pay

## 2022-06-01 DIAGNOSIS — I4510 Unspecified right bundle-branch block: Secondary | ICD-10-CM | POA: Insufficient documentation

## 2022-06-01 DIAGNOSIS — R4702 Dysphasia: Secondary | ICD-10-CM | POA: Diagnosis not present

## 2022-06-01 DIAGNOSIS — R471 Dysarthria and anarthria: Secondary | ICD-10-CM | POA: Insufficient documentation

## 2022-06-01 DIAGNOSIS — Z79899 Other long term (current) drug therapy: Secondary | ICD-10-CM | POA: Insufficient documentation

## 2022-06-01 DIAGNOSIS — R9082 White matter disease, unspecified: Secondary | ICD-10-CM | POA: Insufficient documentation

## 2022-06-01 DIAGNOSIS — G319 Degenerative disease of nervous system, unspecified: Secondary | ICD-10-CM | POA: Diagnosis not present

## 2022-06-01 DIAGNOSIS — I10 Essential (primary) hypertension: Secondary | ICD-10-CM | POA: Diagnosis not present

## 2022-06-01 DIAGNOSIS — Z8673 Personal history of transient ischemic attack (TIA), and cerebral infarction without residual deficits: Secondary | ICD-10-CM | POA: Diagnosis not present

## 2022-06-01 DIAGNOSIS — I6782 Cerebral ischemia: Secondary | ICD-10-CM | POA: Insufficient documentation

## 2022-06-01 DIAGNOSIS — R130 Aphagia: Secondary | ICD-10-CM | POA: Insufficient documentation

## 2022-06-01 DIAGNOSIS — R4701 Aphasia: Secondary | ICD-10-CM | POA: Diagnosis not present

## 2022-06-01 DIAGNOSIS — R4781 Slurred speech: Secondary | ICD-10-CM | POA: Insufficient documentation

## 2022-06-01 LAB — PROTIME-INR
INR: 1 (ref 0.8–1.2)
Prothrombin Time: 13.4 seconds (ref 11.4–15.2)

## 2022-06-01 LAB — CBC
HCT: 42.2 % (ref 36.0–46.0)
Hemoglobin: 13.8 g/dL (ref 12.0–15.0)
MCH: 29.8 pg (ref 26.0–34.0)
MCHC: 32.7 g/dL (ref 30.0–36.0)
MCV: 91.1 fL (ref 80.0–100.0)
Platelets: 215 10*3/uL (ref 150–400)
RBC: 4.63 MIL/uL (ref 3.87–5.11)
RDW: 14.1 % (ref 11.5–15.5)
WBC: 10.3 10*3/uL (ref 4.0–10.5)
nRBC: 0 % (ref 0.0–0.2)

## 2022-06-01 LAB — COMPREHENSIVE METABOLIC PANEL
ALT: 12 U/L (ref 0–44)
AST: 21 U/L (ref 15–41)
Albumin: 4.6 g/dL (ref 3.5–5.0)
Alkaline Phosphatase: 67 U/L (ref 38–126)
Anion gap: 12 (ref 5–15)
BUN: 11 mg/dL (ref 8–23)
CO2: 23 mmol/L (ref 22–32)
Calcium: 9.3 mg/dL (ref 8.9–10.3)
Chloride: 104 mmol/L (ref 98–111)
Creatinine, Ser: 0.7 mg/dL (ref 0.44–1.00)
GFR, Estimated: >60 mL/min (ref >60)
Glucose, Bld: 112 mg/dL — ABNORMAL HIGH (ref 70–99)
Potassium: 3.3 mmol/L — ABNORMAL LOW (ref 3.5–5.1)
Sodium: 139 mmol/L (ref 135–145)
Total Bilirubin: 1.2 mg/dL (ref 0.3–1.2)
Total Protein: 7.2 g/dL (ref 6.5–8.1)

## 2022-06-01 LAB — DIFFERENTIAL
Abs Immature Granulocytes: 0.03 10*3/uL (ref 0.00–0.07)
Basophils Absolute: 0.1 10*3/uL (ref 0.0–0.1)
Basophils Relative: 1 %
Eosinophils Absolute: 0.3 10*3/uL (ref 0.0–0.5)
Eosinophils Relative: 3 %
Immature Granulocytes: 0 %
Lymphocytes Relative: 14 %
Lymphs Abs: 1.4 10*3/uL (ref 0.7–4.0)
Monocytes Absolute: 0.8 10*3/uL (ref 0.1–1.0)
Monocytes Relative: 7 %
Neutro Abs: 7.7 10*3/uL (ref 1.7–7.7)
Neutrophils Relative %: 75 %

## 2022-06-01 LAB — APTT: aPTT: 31 seconds (ref 24–36)

## 2022-06-01 LAB — ETHANOL: Alcohol, Ethyl (B): <10 mg/dL (ref <10)

## 2022-06-01 MED ORDER — HYDRALAZINE HCL 20 MG/ML IJ SOLN
10.0000 mg | Freq: Once | INTRAMUSCULAR | Status: AC
  Administered 2022-06-01: 10 mg via INTRAVENOUS
  Filled 2022-06-01: qty 1

## 2022-06-01 MED ORDER — SODIUM CHLORIDE 0.9% FLUSH
3.0000 mL | Freq: Once | INTRAVENOUS | Status: AC
  Administered 2022-06-01: 3 mL via INTRAVENOUS

## 2022-06-01 MED ORDER — METOPROLOL TARTRATE 5 MG/5ML IV SOLN
5.0000 mg | Freq: Once | INTRAVENOUS | Status: AC
  Administered 2022-06-01: 5 mg via INTRAVENOUS
  Filled 2022-06-01: qty 5

## 2022-06-01 NOTE — ED Notes (Signed)
Patient was able to ambulate to the bathroom with assistance. Gait is unsteady.  Patient is uncomfortable using the purewick. Patient still has moments of expressive aphasia. However when paysing to think about what she wants to say or repeating the sentence, the patient is able to use the correct words.

## 2022-06-01 NOTE — ED Notes (Signed)
Pt cleaned up due to purewick overflow. New linen's placed as well as chux and new purewick.

## 2022-06-01 NOTE — ED Provider Notes (Signed)
[facility] Provider Note    Event Date/Time   First MD Initiated Contact with Patient 06/01/22 1519     (approximate)   History   Code Stroke   HPI  Kellie Harris is a 88 y.o. female  who presents to the emergency department today because of concern for aphagia. History is somewhat limited secondary to aphagia. Symptoms started this afternoon. Patient says that she feels her symptoms have improved from when they started. She denies any associated headache. Denies any recent illness. States she has history of strokes in the past.    Physical Exam   Triage Vital Signs: ED Triage Vitals  Enc Vitals Group     BP 06/01/22 1515 (!) 190/96     Pulse Rate 06/01/22 1504 89     Resp 06/01/22 1504 20     Temp 06/01/22 1504 99 F (37.2 C)     Temp Source 06/01/22 1504 Oral     SpO2 06/01/22 1504 96 %     Weight --      Height --      Head Circumference --      [facility] --      Pain Score --      Pain Loc --      Pain Edu? --      Excl. in [place]? --     Most recent vital signs: Vitals:   06/01/22 1504 06/01/22 1515  BP:  (!) 190/96  Pulse: 89   Resp: 20   Temp: 99 F (37.2 C)   SpO2: 96%     General: Awake, alert, oriented. CV:  Good peripheral perfusion. Regular rate and rhythm. Resp:  Normal effort. Lungs clear. Abd:  No distention.  Other:  Slight dysphagia. No obvious facial assymetry. Strength 5/5 in all extremities.    ED Results / Procedures / Treatments   Labs (all labs ordered are listed, but only abnormal results are displayed) Labs Reviewed  COMPREHENSIVE METABOLIC PANEL - Abnormal; Notable for the following components:      Result Value   Potassium 3.3 (*)    Glucose, Bld 112 (*)    All other components within normal limits  PROTIME-INR  APTT  CBC  DIFFERENTIAL  ETHANOL  CBG MONITORING, ED  I-STAT CREATININE, ED     EKG  I, Nance Pear, attending physician, personally viewed and interpreted this EKG  EKG Time:  1502 Rate: 96 Rhythm: sinus rhythm Axis: left axis deviation Intervals: qtc 484 QRS: RBBB ST changes: no st elevation Impression: abnormal ekg   RADIOLOGY I independently interpreted and visualized the CT head. My interpretation: No bleed Radiology interpretation:  IMPRESSION:  1. No evidence of acute intracranial abnormality. ASPECTS of 10.  2. Extensive chronic small vessel ischemic disease.   MR brain IMPRESSION:  1. No acute intracranial abnormality.  2. Extensive chronic small vessel ischemic disease with multiple old  infarcts.      PROCEDURES:  Critical Care performed: No  Procedures   MEDICATIONS ORDERED IN ED: Medications  sodium chloride flush (NS) 0.9 % injection 3 mL (3 mLs Intravenous Given 06/01/22 1516)     IMPRESSION / MDM / ASSESSMENT AND PLAN / ED COURSE  I reviewed the triage vital signs and the nursing notes.                              Differential diagnosis includes, but is not limited to,  CVA, TIA, anemia, electrolyte abnormality.  Patient's presentation is most consistent with acute presentation with potential threat to life or bodily function.  Patient presented to the emergency department today with concern for aphagia. On my exam slight aphagia still present. Dr. Rory Percy with neurology did evaluate patient and did not feel she was a candidate for thrombolysis at this time. Will obtain MRI brain.  MR brain without any acute findings. Patient symptoms did improve while here in the emergency department. Per patient's daughter who was present at bedside the patient has had similar episodes of aphagia in the past and has had significant work up for them. At this time unclear cause of the patient's aphagia, and unclear if there would be significant benefit of admission for further workup given significant work up in the past for similar episodes. However did discuss with patient admission vs discharge with outpatient follow up. At this time she  did want to try being discharged home which I think is reasonable.  Additionally her blood pressure was elevated here. The patient had not taken her blood pressure medication this morning. Blood pressure did improve with medication.   FINAL CLINICAL IMPRESSION(S) / ED DIAGNOSES   Final diagnoses:  Aphasia        Note:  This document was prepared using Dragon voice recognition software and may include unintentional dictation errors.    Nance Pear, MD 06/01/22 2252

## 2022-06-01 NOTE — ED Notes (Signed)
Pt at CT

## 2022-06-01 NOTE — Discharge Instructions (Signed)
Please seek medical attention for any high fevers, chest pain, shortness of breath, change in behavior, persistent vomiting, bloody stool or any other new or concerning symptoms.  

## 2022-06-01 NOTE — ED Triage Notes (Addendum)
Pt presents to ED via [facility] with c/o of code stroke. EMS states pt was last seen norm with c/o of aphasia at 1300 today. [facility] denies blood thinners use. Pt and EMS denies any recent falls. Pt oriented to herself and place at this time. Pt does have some aphasia and it is hard to understand pt and she does become frustrated when not able to get her words out.

## 2022-06-01 NOTE — Consult Note (Signed)
Neurology Consultation  Reason for Consult: Code stroke for speech problems Referring Physician: Dr. Cherylann Banas  CC: Word finding difficulty, speech issues  History is obtained from: Chart, patient's daughter over the phone  HPI: Kellie Harris is a 88 y.o. female past medical history of hypertension, hyperlipidemia, degenerative disc disease, multiple prior stereotypical episodes of dysphasia (mostly expressive aphasia along with some dysarthria)-with self resolution of the symptoms and negative work-up, who has also been on 1 year of antiepileptics with events happening while on antiepileptics, was brought in for evaluation when her caregiver found her to be having difficult time communicating. Her last known well was around 1300 hrs.  Patient is able to provide any history Speaking with the daughter, this kind of episodes of happen multiple times in the past.  I see notes from multiple colleagues of mine and multiple telemedicine/telestroke consultations to the same effect. She has had 1 MRI with a small subcortical right frontal area of restricted diffusion but again none of the other MRIs have revealed acute strokes.  She has multiple chronic looking lacunar infarctions in chronic white matter disease on her MRI. SWI imaging does not look consistent with cerebral amyloid angiopathy pattern  Of note blood pressure was in the 200s for EMS  LKW: 1300 hrs. IV thrombolysis given?: no, discussed with the daughter, due to the stereotypical nature of these episodes which have been self-resolving in the past, decision made not to use thrombolysis due to side effects-symptoms likely related to stroke mimic Premorbid modified Rankin scale (mRS): 2 uses cane/walker for balance, lives independently and has a caregiver checking on her  ROS: Full ROS was performed and is negative except as noted in the HPI.   Past Medical History:  Diagnosis Date   Arthritis    right knee, left shoulder/ OSTEO   Cancer  ([place])    skin    Degenerative disc disease, lumbar    L4-L5   Diverticulosis    ITIS   History of hiatal hernia    Hydronephrosis    right   Hypercholesteremia    Hypertension    CONTROLLED ON MEDS   IBS (irritable bowel syndrome)    Knee fracture, right 03/05/2015   DIFFICULTY WITH AMBULATION   Osteopenia    Ovarian cyst    Pancreatic insufficiency    Stroke [facility])    Wears hearing aid    bilateral     Family History  Problem Relation Age of Onset   Breast cancer Mother 72   Leukemia Mother    Stroke Father      Social History:   reports that she has never smoked. She has never used smokeless tobacco. She reports that she does not drink alcohol and does not use drugs.  Medications  Current Facility-Administered Medications:    sodium chloride flush (NS) 0.9 % injection 3 mL, 3 mL, Intravenous, Once, Arta Silence, MD  Current Outpatient Medications:    acetaminophen (TYLENOL) 500 MG tablet, Take 1,000 mg by mouth every 8 (eight) hours as needed for mild pain or moderate pain., Disp: , Rfl:    amLODipine (NORVASC) 5 MG tablet, Take 5 mg by mouth daily., Disp: , Rfl:    aspirin 81 MG chewable tablet, Chew 1 tablet (81 mg total) by mouth 2 (two) times daily., Disp: 60 tablet, Rfl: 0   Cholecalciferol (VITAMIN D) 50 MCG (2000 UT) tablet, Take 2,000 Units by mouth daily., Disp: , Rfl:    ferrous sulfate 325 (65 FE) MG tablet,  Take 1 tablet (325 mg total) by mouth daily., Disp: 30 tablet, Rfl: 0   lisinopril (ZESTRIL) 20 MG tablet, Take 1 tablet by mouth 2 (two) times daily., Disp: , Rfl:    metoprolol succinate (TOPROL-XL) 25 MG 24 hr tablet, Take 12.5 mg by mouth daily., Disp: , Rfl:    pravastatin (PRAVACHOL) 40 MG tablet, Take 40 mg by mouth at bedtime., Disp: , Rfl:    tamsulosin (FLOMAX) 0.4 MG CAPS capsule, Take 1 capsule (0.4 mg total) by mouth daily after supper., Disp: 30 capsule, Rfl:    vitamin B-12 1000 MCG tablet, Take 1 tablet (1,000 mcg total) by  mouth daily., Disp: 30 tablet, Rfl: 0   Exam: Current vital signs: Pulse 89   Temp 99 F (37.2 C) (Oral)   Resp 20   SpO2 96%  Vital signs in last 24 hours: Temp:  [99 F (37.2 C)] 99 F (37.2 C) (10/20 1504) Pulse Rate:  [89] 89 (10/20 1504) Resp:  [20] 20 (10/20 1504) SpO2:  [96 %] 96 % (10/20 1504)  General: Awake alert in no distress HNT: Normocephalic atraumatic Lungs: Clear Cardiovascular: Regular rhythm Extremities warm well perfused Neurologic exam She is awake alert in no distress She is able to follow simple and multistep commands without a problem. She has difficulty naming multiple objects but is able to name simple objects such as thumb and glasses. Her speech is nonfluent Has mild dysarthria as well Unable to repeat Cranial nerves II to XII intact Motor examination with no drift Sensation intact light touch Coordination exam no dysmetria And associated-3-(2 for aphasia, 1 for dysarthria)  Labs I have reviewed labs in epic and the results pertinent to this consultation are:  CBC    Component Value Date/Time   WBC 10.3 06/01/2022 1429   RBC 4.63 06/01/2022 1429   HGB 13.8 06/01/2022 1429   HGB 12.5 07/10/2014 0530   HCT 42.2 06/01/2022 1429   HCT 37.5 07/10/2014 0530   PLT 215 06/01/2022 1429   PLT 243 07/10/2014 0530   MCV 91.1 06/01/2022 1429   MCV 90 07/10/2014 0530   MCH 29.8 06/01/2022 1429   MCHC 32.7 06/01/2022 1429   RDW 14.1 06/01/2022 1429   RDW 13.7 07/10/2014 0530   LYMPHSABS 1.4 06/01/2022 1429   LYMPHSABS 1.7 07/10/2014 0530   MONOABS 0.8 06/01/2022 1429   MONOABS 0.7 07/10/2014 0530   EOSABS 0.3 06/01/2022 1429   EOSABS 0.5 07/10/2014 0530   BASOSABS 0.1 06/01/2022 1429   BASOSABS 0.1 07/10/2014 0530    CMP     Component Value Date/Time   NA 135 09/10/2021 0607   NA 138 07/10/2014 0530   K 3.4 (L) 09/10/2021 0607   K 3.8 07/10/2014 0530   CL 102 09/10/2021 0607   CL 105 07/10/2014 0530   CO2 25 09/10/2021 0607    CO2 26 07/10/2014 0530   GLUCOSE 94 09/10/2021 0607   GLUCOSE 97 07/10/2014 0530   BUN 12 09/10/2021 0607   BUN 13 07/10/2014 0530   CREATININE 0.74 09/10/2021 0607   CREATININE 0.96 07/10/2014 0530   CALCIUM 9.6 09/10/2021 0607   CALCIUM 10.1 07/10/2014 0530   PROT 6.6 09/07/2021 0945   PROT 7.7 07/08/2014 2102   ALBUMIN 3.3 (L) 09/07/2021 0945   ALBUMIN 4.2 07/08/2014 2102   AST 29 09/07/2021 0945   AST 20 07/08/2014 2102   ALT 11 09/07/2021 0945   ALT 24 07/08/2014 2102   ALKPHOS 65 09/07/2021 0945  ALKPHOS 90 07/08/2014 2102   BILITOT 1.4 (H) 09/07/2021 0945   BILITOT 0.3 07/08/2014 2102   GFRNONAA >60 09/10/2021 0607   GFRNONAA 59 (L) 07/10/2014 0530   GFRNONAA >60 07/02/2013 2228   GFRAA >60 05/04/2020 0424   GFRAA >60 07/10/2014 0530   GFRAA >60 07/02/2013 2228   Imaging I have reviewed the images obtained:  CT-head-aspects 10.  No bleed    Assessment:  88 year old who presents for evaluation of dysphasia-mild dysarthria and expressive aphasia which has been stereotypical and she has been seen for it at least 7-8 times since 2019 with only 1 MRI that was positive for a right frontal subacute subcortical stroke not correlating to her symptoms of aphasia, presents for evaluation of similar symptoms. NIH stroke scale of 3 and the fact that this is still typical of her prior presentations that have been self resolving made as decided not to use TNKase after discussion with her daughter over the phone. I would recommend MRI to rule out a new stroke although my suspicion is this might be similar to one of the prior episodes. She does not have any evidence of cerebral amyloid angiopathy looking changes on her prior MRIs so amyloid spells are less likely.  She has been evaluated with EEG and been treated with antiepileptic for about a year but had episodes even while being on antiepileptics making seizures less likely.  Impression: Sudden onset onset aphasia and  dysarthria-stereotypic episode that has happened multiple times in the past few years which  self resolves and work-up has been negative thus far Less likely seizure-has had negative spot EEGs in the past.  Will need long-term EEG/EMU admission at some point as an outpatient Less likely stroke-but given risk factors will need further imaging Patient blood pressure for EMS was in the 200s-could be hypertensive encephalopathy hypertensive urgency.  Recommendations: MRI brain without contrast-if negative, no further stroke work-up needed. If MRI is positive for stroke, allow permissive hypertension otherwise start normalizing blood pressures as you would for hypertensive urgency. Monitor in the ED, check labs and if symptoms get better, can be discharged home with outpatient follow-up with Dr. Melrose Nakayama who she follows at [facility] clinic with. Consider EMU admission for spell characterization-we will defer to the outpatient neurologist Plan relayed to the ED provider coming on service Neurology will be available with questions as needed.  -- Amie Portland, MD Neurologist [facility] Pager: [phone]

## 2022-06-01 NOTE — Progress Notes (Signed)
Telestroke RN Note:  [place] 1440 - Dr. Rory Percy in CT with pt at time of el[place] mRS 2 LNW 1300 - slurred speech and aphasia No TNK per Dr. Rory Percy at [phone]

## 2022-06-05 DIAGNOSIS — M25552 Pain in left hip: Secondary | ICD-10-CM | POA: Diagnosis not present

## 2022-06-05 DIAGNOSIS — R531 Weakness: Secondary | ICD-10-CM | POA: Diagnosis not present

## 2022-06-05 DIAGNOSIS — M25512 Pain in left shoulder: Secondary | ICD-10-CM | POA: Diagnosis not present

## 2022-06-05 DIAGNOSIS — R4701 Aphasia: Secondary | ICD-10-CM | POA: Diagnosis not present

## 2022-06-05 DIAGNOSIS — M25511 Pain in right shoulder: Secondary | ICD-10-CM | POA: Diagnosis not present

## 2022-06-05 DIAGNOSIS — R42 Dizziness and giddiness: Secondary | ICD-10-CM | POA: Diagnosis not present

## 2022-06-05 DIAGNOSIS — Z8673 Personal history of transient ischemic attack (TIA), and cerebral infarction without residual deficits: Secondary | ICD-10-CM | POA: Diagnosis not present

## 2022-06-05 LAB — GLUCOSE, CAPILLARY: Glucose-Capillary: 96 mg/dL (ref 70–99)

## 2022-06-06 DIAGNOSIS — M19042 Primary osteoarthritis, left hand: Secondary | ICD-10-CM | POA: Diagnosis not present

## 2022-06-06 DIAGNOSIS — Z8673 Personal history of transient ischemic attack (TIA), and cerebral infarction without residual deficits: Secondary | ICD-10-CM | POA: Diagnosis not present

## 2022-06-06 DIAGNOSIS — I10 Essential (primary) hypertension: Secondary | ICD-10-CM | POA: Diagnosis not present

## 2022-06-06 DIAGNOSIS — M170 Bilateral primary osteoarthritis of knee: Secondary | ICD-10-CM | POA: Diagnosis not present

## 2022-06-06 DIAGNOSIS — M19012 Primary osteoarthritis, left shoulder: Secondary | ICD-10-CM | POA: Diagnosis not present

## 2022-06-06 DIAGNOSIS — M19041 Primary osteoarthritis, right hand: Secondary | ICD-10-CM | POA: Diagnosis not present

## 2022-06-06 DIAGNOSIS — Z96643 Presence of artificial hip joint, bilateral: Secondary | ICD-10-CM | POA: Diagnosis not present

## 2022-06-06 DIAGNOSIS — M19011 Primary osteoarthritis, right shoulder: Secondary | ICD-10-CM | POA: Diagnosis not present

## 2022-06-22 DIAGNOSIS — H40003 Preglaucoma, unspecified, bilateral: Secondary | ICD-10-CM | POA: Diagnosis not present

## 2022-07-04 DIAGNOSIS — I10 Essential (primary) hypertension: Secondary | ICD-10-CM | POA: Diagnosis not present

## 2022-07-04 DIAGNOSIS — E782 Mixed hyperlipidemia: Secondary | ICD-10-CM | POA: Diagnosis not present

## 2022-07-12 DIAGNOSIS — E782 Mixed hyperlipidemia: Secondary | ICD-10-CM | POA: Diagnosis not present

## 2022-07-12 DIAGNOSIS — Z0000 Encounter for general adult medical examination without abnormal findings: Secondary | ICD-10-CM | POA: Diagnosis not present

## 2022-07-12 DIAGNOSIS — I10 Essential (primary) hypertension: Secondary | ICD-10-CM | POA: Diagnosis not present

## 2022-07-12 DIAGNOSIS — Z8673 Personal history of transient ischemic attack (TIA), and cerebral infarction without residual deficits: Secondary | ICD-10-CM | POA: Diagnosis not present

## 2022-07-27 DIAGNOSIS — H40003 Preglaucoma, unspecified, bilateral: Secondary | ICD-10-CM | POA: Diagnosis not present

## 2022-09-18 DIAGNOSIS — M25552 Pain in left hip: Secondary | ICD-10-CM | POA: Diagnosis not present

## 2022-09-18 DIAGNOSIS — R4701 Aphasia: Secondary | ICD-10-CM | POA: Diagnosis not present

## 2022-09-18 DIAGNOSIS — R531 Weakness: Secondary | ICD-10-CM | POA: Diagnosis not present

## 2022-09-18 DIAGNOSIS — R42 Dizziness and giddiness: Secondary | ICD-10-CM | POA: Diagnosis not present

## 2022-09-18 DIAGNOSIS — M25511 Pain in right shoulder: Secondary | ICD-10-CM | POA: Diagnosis not present

## 2022-09-18 DIAGNOSIS — Z8673 Personal history of transient ischemic attack (TIA), and cerebral infarction without residual deficits: Secondary | ICD-10-CM | POA: Diagnosis not present

## 2022-09-18 DIAGNOSIS — M25512 Pain in left shoulder: Secondary | ICD-10-CM | POA: Diagnosis not present

## 2022-10-24 IMAGING — MR MR HEAD W/O CM
11 series · 47 of 48 positions shown · non-contrast
Comparison: CT head without contrast 01/28/2021. MR head without
contrast 05/03/2020

CLINICAL DATA: Neuro deficit, acute, stroke suspected. Episode of
near syncope.

EXAM:
MRI HEAD WITHOUT CONTRAST
TECHNIQUE: Multiplanar, multiecho pulse sequences of the brain and surrounding
structures were obtained without intravenous contrast.

[Series 5: ax dwi_tracew · axial · 3.0mm · 0.71mm/px · z∈[-97,+47]mm · 4 of 50 slices shown]
[im 1/50]
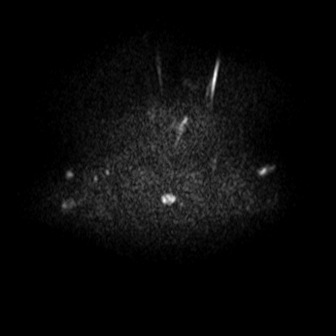
[im 17/50]
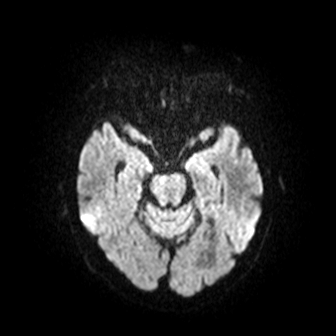
[im 33/50]
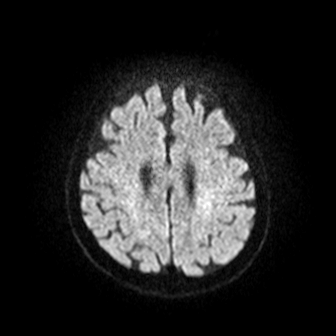
[im 50/50]
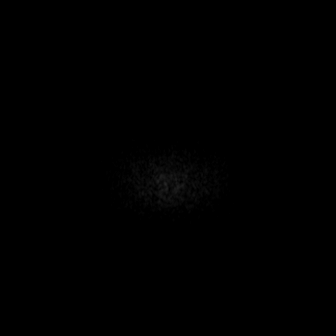

[Series 6: ax dwi_adc · axial · 3.0mm · 0.71mm/px · z∈[-97,+47]mm · 4 of 50 slices shown]
[im 1/50]
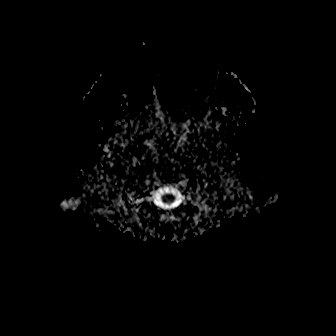
[im 17/50]
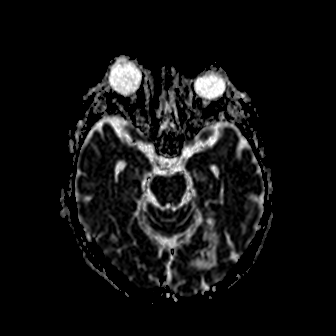
[im 33/50]
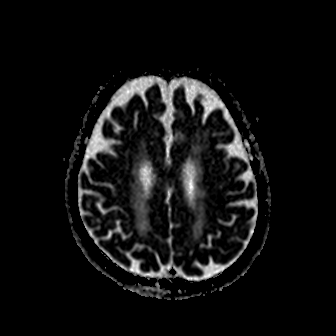
[im 50/50]
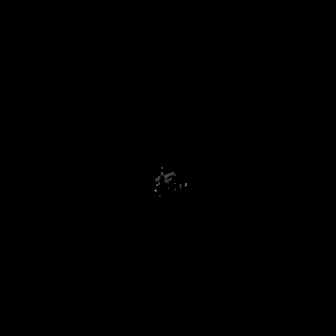

[Series 7: cor dwi_tracew · coronal · 5.0mm · 0.68mm/px · 3 of 36 slices shown]
[im 1/36]
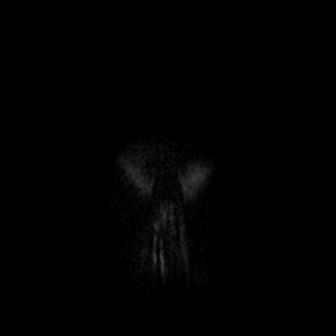
[im 18/36]
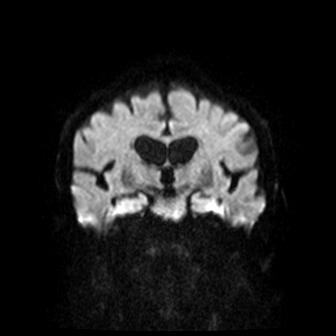
[im 36/36]
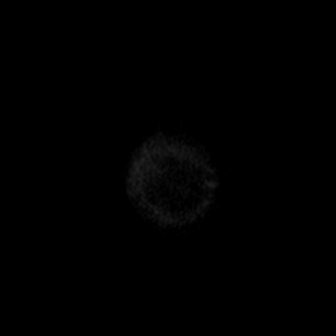

[Series 8: cor dwi_adc · coronal · 5.0mm · 0.68mm/px · 3 of 36 slices shown]
[im 1/36]
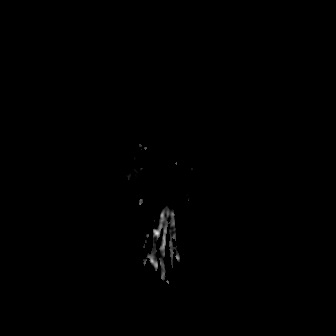
[im 18/36]
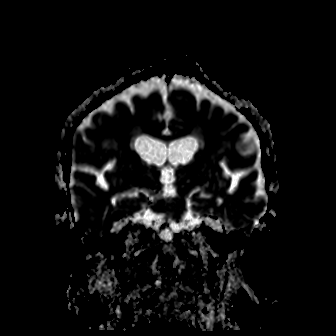
[im 36/36]
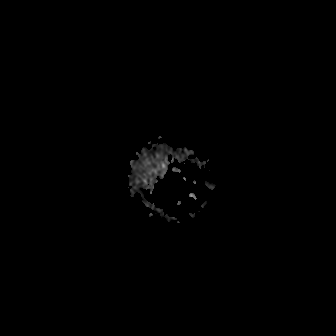

[Series 9: T1 · sagittal · 5.0mm · 0.62mm/px · 2 of 21 slices shown (1 of 2)]
[im 1/21]
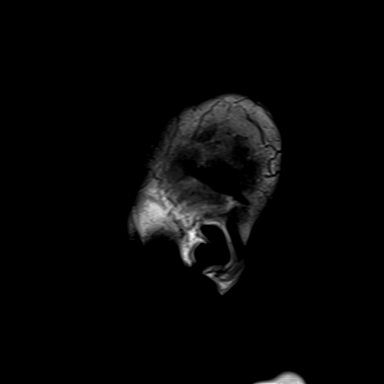
[im 21/21]
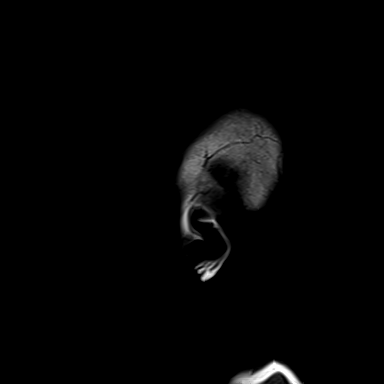

[Series 10: T2 · axial · 5.0mm · 0.53mm/px · z∈[-94,+47]mm · 2 of 25 slices shown (1 of 2)]
[im 1/25]
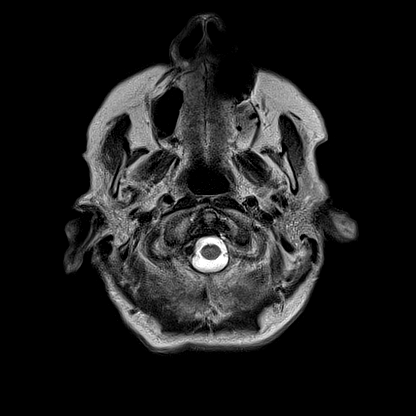
[im 25/25]
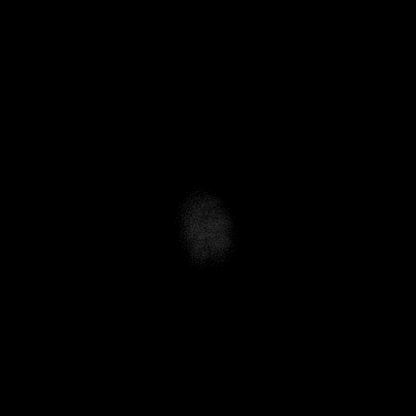

[Series 12: pha_images · axial · 3.0mm · 0.90mm/px · z∈[-111,+62]mm · 5 of 58 slices shown]
[im 1/58]
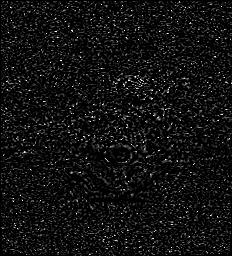
[im 15/58]
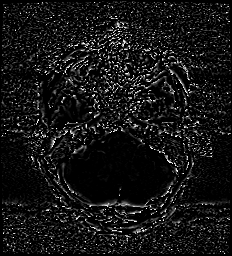
[im 29/58]
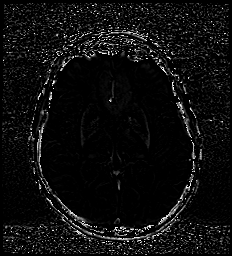
[im 43/58]
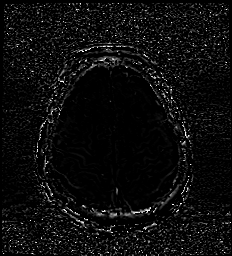
[im 58/58]
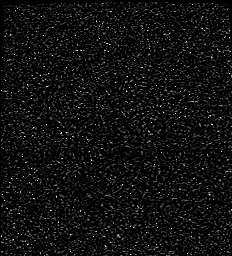

[Series 13: swi_images · axial · 3.0mm · 0.90mm/px · z∈[-111,+62]mm · 5 of 60 slices shown]
[im 1/60]
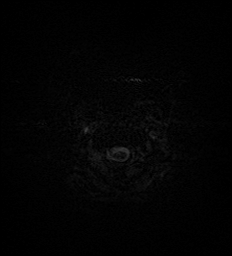
[im 15/60]
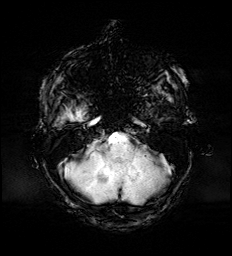
[im 30/60]
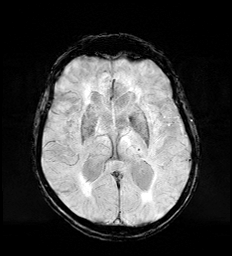
[im 45/60]
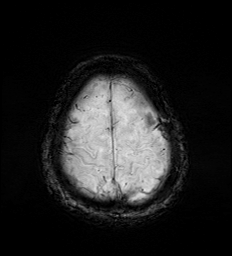
[im 60/60]
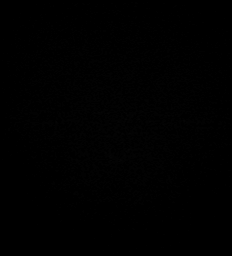

[Series 15: FLAIR · axial · 3.0mm · 0.53mm/px · z∈[-103,+56]mm · 5 of 55 slices shown]
[im 1/55]
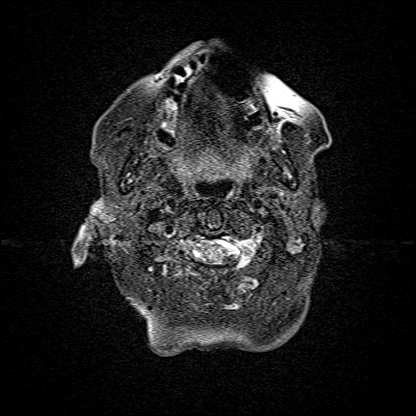
[im 14/55]
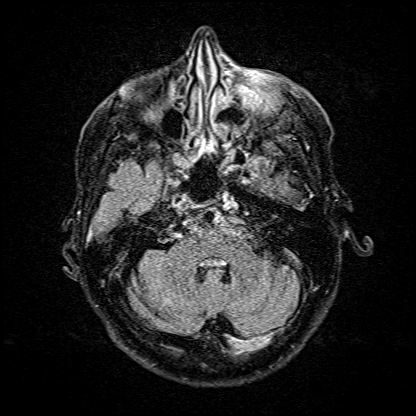
[im 28/55]
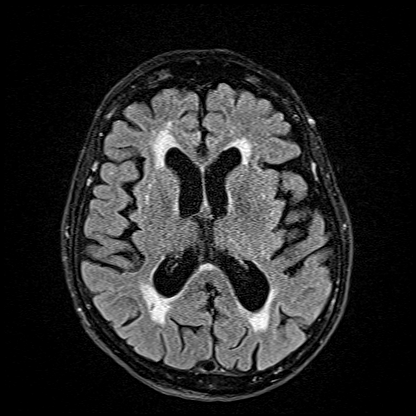
[im 41/55]
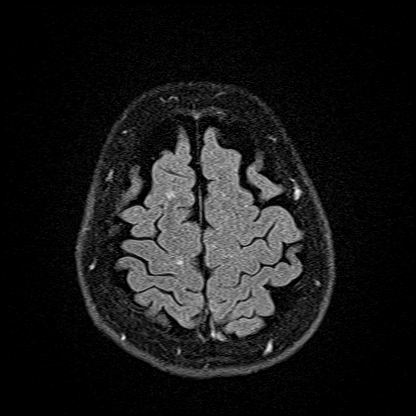
[im 55/55]
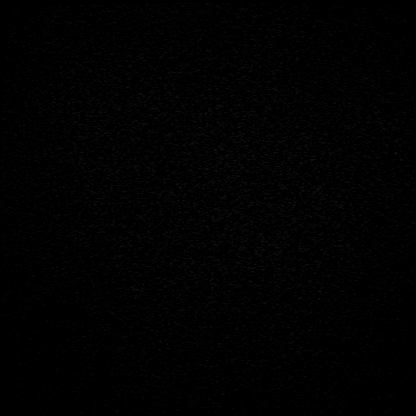

[Series 16: T1 · axial · 1.0mm · 0.98mm/px · z∈[-102,+53]mm · 12 of 157 slices shown (2 of 2)]
[im 1/157]
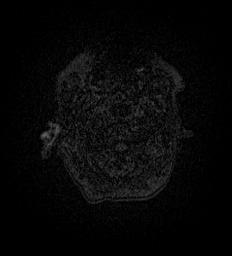
[im 14/157]
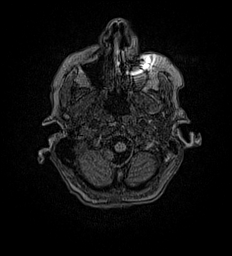
[im 27/157]
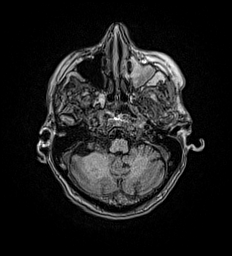
[im 40/157]
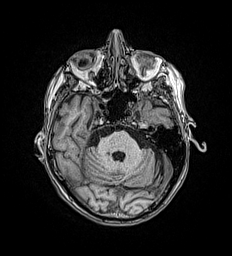
[im 53/157]
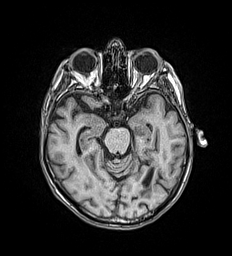
[im 66/157]
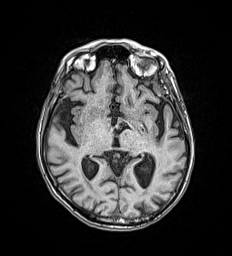
[im 79/157]
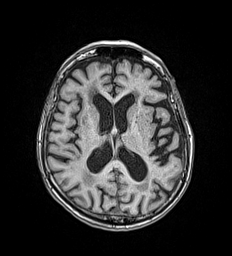
[im 92/157]
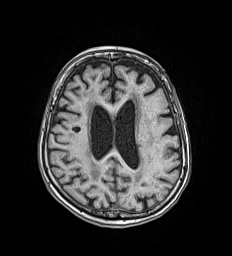
[im 105/157]
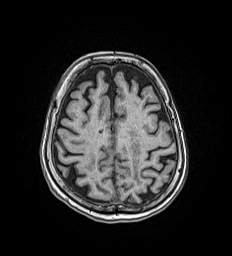
[im 118/157]
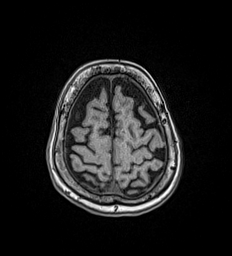
[im 131/157]
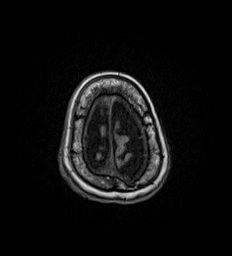
[im 157/157]
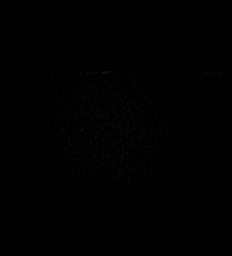

[Series 17: T2 · coronal · 5.0mm · 0.57mm/px · 2 of 29 slices shown (2 of 2)]
[im 1/29]
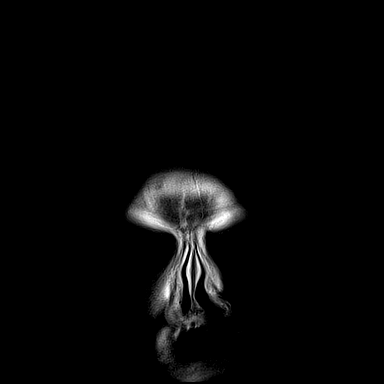
[im 29/29]
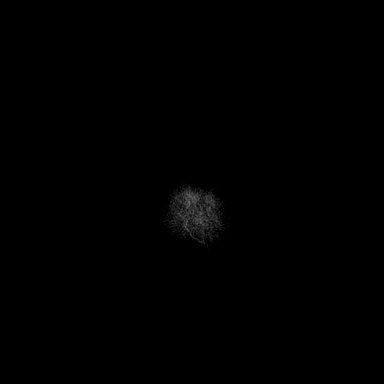

[47 of 48 positions shown; findings below may reference images not displayed]

FINDINGS: Brain: Focal area of subcortical diffusion abnormality is present in
the posterior right frontal lobe on image 35 of series 6 and image
17 of series 7. There is some FLAIR signal associated which is new
from the prior study.

No other acute infarct is present. Remote infarcts involving the
inferior subcortical operculum on the right, bilateral thalami, and
bilateral cerebellum are stable. Confluent periventricular T2
hyperintensities bilaterally are similar the prior exam. Remote
infarct of the left occipital pole is stable.

Vascular: Flow is present in the major intracranial arteries.

Skull and upper cervical spine: Multilevel degenerative changes of
the upper cervical spine are stable craniocervical junction is
within normal limits. Midline structures are otherwise unremarkable.

Sinuses/Orbits: Chronic left maxillary sinus disease again noted.
There is some mucosal thickening within anterior left ethmoid air
cells. The paranasal sinuses and mastoid air cells are otherwise
clear. Bilateral lens replacements are noted. Globes and orbits are
otherwise unremarkable.
IMPRESSION: 1. New subcortical diffusion abnormality in the posterior right
frontal lobe consistent with a small acute/subacute white matter
infarct.
2. No other acute intracranial abnormality or significant interval
change.
3. Stable atrophy and white matter disease.
4. Multiple other remote infarcts as described are stable.
5. Chronic left maxillary sinus disease.

## 2022-10-28 IMAGING — DX DG CHEST 1V PORT
1 series · 1 of 1 positions shown · non-contrast
Comparison: 08/24/2017

CLINICAL DATA: Dizziness

EXAM:
PORTABLE CHEST 1 VIEW

[chest ap]
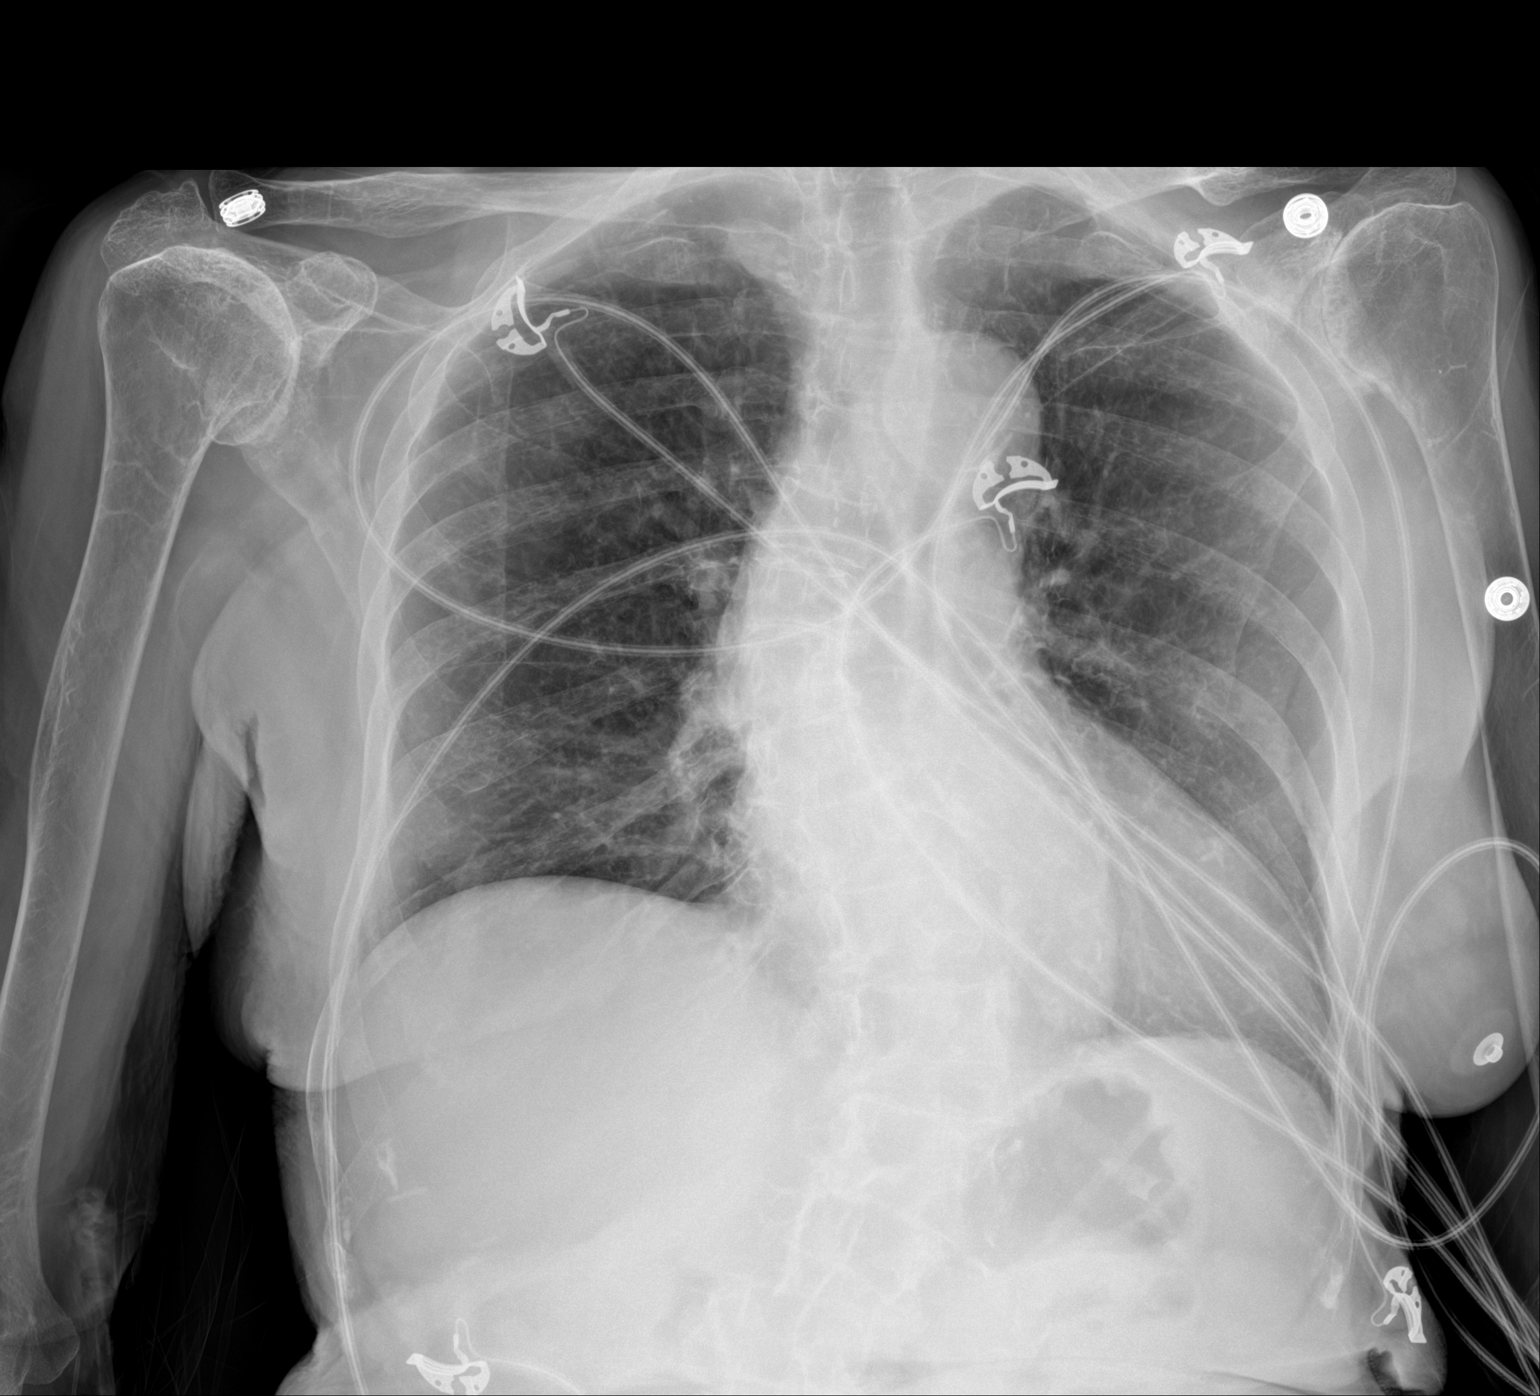

[1 of 1 positions shown; findings below may reference images not displayed]

FINDINGS: Cardiac shadow is mildly prominent but stable. Aortic calcifications
are again seen. Lungs are well aerated bilaterally. No focal
infiltrate or effusion is seen. No acute bony abnormality is noted.
Degenerative S-shaped scoliosis is again noted and stable.
IMPRESSION: No acute abnormality noted.

## 2022-11-12 DIAGNOSIS — M25511 Pain in right shoulder: Secondary | ICD-10-CM | POA: Diagnosis not present

## 2022-11-12 DIAGNOSIS — R42 Dizziness and giddiness: Secondary | ICD-10-CM | POA: Diagnosis not present

## 2022-11-12 DIAGNOSIS — M25512 Pain in left shoulder: Secondary | ICD-10-CM | POA: Diagnosis not present

## 2022-11-12 DIAGNOSIS — R531 Weakness: Secondary | ICD-10-CM | POA: Diagnosis not present

## 2022-11-12 DIAGNOSIS — Z8673 Personal history of transient ischemic attack (TIA), and cerebral infarction without residual deficits: Secondary | ICD-10-CM | POA: Diagnosis not present

## 2022-11-12 DIAGNOSIS — M25552 Pain in left hip: Secondary | ICD-10-CM | POA: Diagnosis not present

## 2022-11-12 DIAGNOSIS — R4701 Aphasia: Secondary | ICD-10-CM | POA: Diagnosis not present

## 2023-01-09 DIAGNOSIS — Z8673 Personal history of transient ischemic attack (TIA), and cerebral infarction without residual deficits: Secondary | ICD-10-CM | POA: Diagnosis not present

## 2023-01-09 DIAGNOSIS — E782 Mixed hyperlipidemia: Secondary | ICD-10-CM | POA: Diagnosis not present

## 2023-01-09 DIAGNOSIS — I10 Essential (primary) hypertension: Secondary | ICD-10-CM | POA: Diagnosis not present

## 2023-01-17 DIAGNOSIS — I10 Essential (primary) hypertension: Secondary | ICD-10-CM | POA: Diagnosis not present

## 2023-01-17 DIAGNOSIS — Z8673 Personal history of transient ischemic attack (TIA), and cerebral infarction without residual deficits: Secondary | ICD-10-CM | POA: Diagnosis not present

## 2023-01-17 DIAGNOSIS — E782 Mixed hyperlipidemia: Secondary | ICD-10-CM | POA: Diagnosis not present

## 2023-01-17 DIAGNOSIS — R600 Localized edema: Secondary | ICD-10-CM | POA: Diagnosis not present

## 2023-02-20 DIAGNOSIS — E559 Vitamin D deficiency, unspecified: Secondary | ICD-10-CM | POA: Diagnosis not present

## 2023-02-20 DIAGNOSIS — M810 Age-related osteoporosis without current pathological fracture: Secondary | ICD-10-CM | POA: Diagnosis not present

## 2023-02-25 DIAGNOSIS — I10 Essential (primary) hypertension: Secondary | ICD-10-CM | POA: Diagnosis not present

## 2023-02-25 DIAGNOSIS — I739 Peripheral vascular disease, unspecified: Secondary | ICD-10-CM | POA: Diagnosis not present

## 2023-02-25 DIAGNOSIS — Z6821 Body mass index (BMI) 21.0-21.9, adult: Secondary | ICD-10-CM | POA: Diagnosis not present

## 2023-02-25 DIAGNOSIS — I69351 Hemiplegia and hemiparesis following cerebral infarction affecting right dominant side: Secondary | ICD-10-CM | POA: Diagnosis not present

## 2023-02-25 DIAGNOSIS — M159 Polyosteoarthritis, unspecified: Secondary | ICD-10-CM | POA: Diagnosis not present

## 2023-02-25 DIAGNOSIS — Z515 Encounter for palliative care: Secondary | ICD-10-CM | POA: Diagnosis not present

## 2023-02-25 DIAGNOSIS — Z96649 Presence of unspecified artificial hip joint: Secondary | ICD-10-CM | POA: Diagnosis not present

## 2023-03-04 DIAGNOSIS — M25512 Pain in left shoulder: Secondary | ICD-10-CM | POA: Diagnosis not present

## 2023-03-04 DIAGNOSIS — R531 Weakness: Secondary | ICD-10-CM | POA: Diagnosis not present

## 2023-03-04 DIAGNOSIS — M25511 Pain in right shoulder: Secondary | ICD-10-CM | POA: Diagnosis not present

## 2023-03-04 DIAGNOSIS — R4701 Aphasia: Secondary | ICD-10-CM | POA: Diagnosis not present

## 2023-03-04 DIAGNOSIS — R42 Dizziness and giddiness: Secondary | ICD-10-CM | POA: Diagnosis not present

## 2023-03-04 DIAGNOSIS — Z8673 Personal history of transient ischemic attack (TIA), and cerebral infarction without residual deficits: Secondary | ICD-10-CM | POA: Diagnosis not present

## 2023-03-10 DIAGNOSIS — M545 Low back pain, unspecified: Secondary | ICD-10-CM | POA: Diagnosis not present

## 2023-05-07 DIAGNOSIS — Z66 Do not resuscitate: Secondary | ICD-10-CM | POA: Diagnosis not present

## 2023-05-07 DIAGNOSIS — Z515 Encounter for palliative care: Secondary | ICD-10-CM | POA: Diagnosis not present

## 2023-05-07 DIAGNOSIS — I10 Essential (primary) hypertension: Secondary | ICD-10-CM | POA: Diagnosis not present

## 2023-05-07 DIAGNOSIS — I739 Peripheral vascular disease, unspecified: Secondary | ICD-10-CM | POA: Diagnosis not present

## 2023-05-07 DIAGNOSIS — G3184 Mild cognitive impairment, so stated: Secondary | ICD-10-CM | POA: Diagnosis not present

## 2023-05-07 DIAGNOSIS — D692 Other nonthrombocytopenic purpura: Secondary | ICD-10-CM | POA: Diagnosis not present

## 2023-05-07 DIAGNOSIS — M159 Polyosteoarthritis, unspecified: Secondary | ICD-10-CM | POA: Diagnosis not present

## 2023-05-29 DIAGNOSIS — M810 Age-related osteoporosis without current pathological fracture: Secondary | ICD-10-CM | POA: Diagnosis not present

## 2023-07-10 DIAGNOSIS — E782 Mixed hyperlipidemia: Secondary | ICD-10-CM | POA: Diagnosis not present

## 2023-07-10 DIAGNOSIS — Z8673 Personal history of transient ischemic attack (TIA), and cerebral infarction without residual deficits: Secondary | ICD-10-CM | POA: Diagnosis not present

## 2023-07-19 DIAGNOSIS — I77810 Thoracic aortic ectasia: Secondary | ICD-10-CM | POA: Diagnosis not present

## 2023-07-19 DIAGNOSIS — Z0000 Encounter for general adult medical examination without abnormal findings: Secondary | ICD-10-CM | POA: Diagnosis not present

## 2023-07-19 DIAGNOSIS — I10 Essential (primary) hypertension: Secondary | ICD-10-CM | POA: Diagnosis not present

## 2023-07-19 DIAGNOSIS — E782 Mixed hyperlipidemia: Secondary | ICD-10-CM | POA: Diagnosis not present

## 2023-07-26 DIAGNOSIS — H40003 Preglaucoma, unspecified, bilateral: Secondary | ICD-10-CM | POA: Diagnosis not present

## 2023-08-02 DIAGNOSIS — H34831 Tributary (branch) retinal vein occlusion, right eye, with macular edema: Secondary | ICD-10-CM | POA: Diagnosis not present

## 2023-08-02 DIAGNOSIS — Z961 Presence of intraocular lens: Secondary | ICD-10-CM | POA: Diagnosis not present

## 2023-08-02 DIAGNOSIS — H401112 Primary open-angle glaucoma, right eye, moderate stage: Secondary | ICD-10-CM | POA: Diagnosis not present

## 2023-08-02 DIAGNOSIS — H401121 Primary open-angle glaucoma, left eye, mild stage: Secondary | ICD-10-CM | POA: Diagnosis not present

## 2023-08-14 DIAGNOSIS — I639 Cerebral infarction, unspecified: Secondary | ICD-10-CM

## 2023-08-14 HISTORY — DX: Cerebral infarction, unspecified: I63.9

## 2023-08-21 DIAGNOSIS — S81811A Laceration without foreign body, right lower leg, initial encounter: Secondary | ICD-10-CM | POA: Diagnosis not present

## 2023-08-21 DIAGNOSIS — Z23 Encounter for immunization: Secondary | ICD-10-CM | POA: Diagnosis not present

## 2023-09-20 ENCOUNTER — Emergency Department
Admission: EM | Admit: 2023-09-20 | Discharge: 2023-09-20 | Disposition: A | Discharge status: Home / Self Care | Payer: PPO | Attending: Student in an Organized Health Care Education/Training Program | Admitting: Student in an Organized Health Care Education/Training Program

## 2023-09-20 ENCOUNTER — Emergency Department: Payer: PPO

## 2023-09-20 ENCOUNTER — Other Ambulatory Visit: Payer: Self-pay

## 2023-09-20 DIAGNOSIS — S41112A Laceration without foreign body of left upper arm, initial encounter: Secondary | ICD-10-CM | POA: Diagnosis not present

## 2023-09-20 DIAGNOSIS — M19012 Primary osteoarthritis, left shoulder: Secondary | ICD-10-CM | POA: Diagnosis not present

## 2023-09-20 DIAGNOSIS — W19XXXA Unspecified fall, initial encounter: Secondary | ICD-10-CM | POA: Diagnosis not present

## 2023-09-20 DIAGNOSIS — Z043 Encounter for examination and observation following other accident: Secondary | ICD-10-CM | POA: Diagnosis not present

## 2023-09-20 MED ORDER — LIDOCAINE HCL (PF) 1 % IJ SOLN
5.0000 mL | Freq: Once | INTRAMUSCULAR | Status: AC
  Administered 2023-09-20: 5 mL via INTRADERMAL
  Filled 2023-09-20: qty 5

## 2023-09-20 MED ORDER — LIDOCAINE-EPINEPHRINE-TETRACAINE (LET) TOPICAL GEL
3.0000 mL | Freq: Once | TOPICAL | Status: AC
  Administered 2023-09-20: 3 mL via TOPICAL
  Filled 2023-09-20: qty 3

## 2023-09-20 NOTE — ED Provider Notes (Signed)
 [facility] Provider Note    Event Date/Time   First MD Initiated Contact with Patient 09/20/23 2219     (approximate)   History   Fall   HPI  FRANCELLA Harris is a 89 y.o. female who presents to the ER for evaluation of left arm laceration that occurred after mechanical fall.  She is using her walker which she typically ambulates with.  States that her foot got caught feels like she hit her left arm on the walker.  She landed on the ground did not hit her head.     Physical Exam   Triage Vital Signs: ED Triage Vitals  Encounter Vitals Group     BP 09/20/23 2215 (!) 164/82     Systolic BP Percentile --      Diastolic BP Percentile --      Pulse Rate 09/20/23 2215 (!) 109     Resp 09/20/23 2215 16     Temp 09/20/23 2215 98.1 F (36.7 C)     Temp Source 09/20/23 2215 Oral     SpO2 09/20/23 2215 93 %     Weight 09/20/23 2213 95 lb (43.1 kg)     Height 09/20/23 2213 4' 11 (1.499 m)     Head Circumference --      Peak Flow --      Pain Score 09/20/23 2213 5     Pain Loc --      Pain Education --      Exclude from Growth Chart --     Most recent vital signs: Vitals:   09/20/23 2215  BP: (!) 164/82  Pulse: (!) 109  Resp: 16  Temp: 98.1 F (36.7 C)  SpO2: 93%     Constitutional: Alert  Eyes: Conjunctivae are normal.  Head: Atraumatic. Nose: No congestion/rhinnorhea. Mouth/Throat: Mucous membranes are moist.   Neck: Painless ROM.  Cardiovascular:   Good peripheral circulation. Respiratory: Normal respiratory effort.  No retractions.  Gastrointestinal: Soft and nontender.  Musculoskeletal:  no deformity Neurologic:  MAE spontaneously. No gross focal neurologic deficits are appreciated.  Skin: 5 cm triangular laceration of the left anterior upper arm.  Hemostatic.  Does involve deep subcutaneous layers.  Goes down to muscle body but does not violate muscle body fascia.  Neurovascular intact distally. Psychiatric: Mood and affect are  normal. Speech and behavior are normal.    ED Results / Procedures / Treatments   Labs (all labs ordered are listed, but only abnormal results are displayed) Labs Reviewed - No data to display   EKG     RADIOLOGY Please see ED Course for my review and interpretation.  I personally reviewed all radiographic images ordered to evaluate for the above acute complaints and reviewed radiology reports and findings.  These findings were personally discussed with the patient.  Please see medical record for radiology report.    PROCEDURES:  Critical Care performed: No  .Laceration Repair  Date/Time: 09/20/2023 11:01 PM  Performed by: Lang Dover, MD Authorized by: Lang Dover, MD   Consent:    Consent obtained:  Verbal   Risks discussed:  Infection, pain, retained foreign body, poor cosmetic result and need for additional repair Anesthesia:    Anesthesia method:  Topical application and local infiltration   Topical anesthetic:  LET   Local anesthetic:  Lidocaine  1% w/o epi Laceration details:    Location:  Shoulder/arm   Shoulder/arm location:  L upper arm   Length (cm):  5  Depth (mm):  5 Pre-procedure details:    Preparation:  Patient was prepped and draped in usual sterile fashion Exploration:    Imaging obtained: x-ray     Wound exploration: entire depth of wound visualized   Treatment:    Area cleansed with:  Povidone-iodine  and saline   Amount of cleaning:  Standard   Layers/structures repaired:  Deep subcutaneous Deep subcutaneous:    Suture size:  4-0   Suture material:  Vicryl   Suture technique:  Simple interrupted   Number of sutures:  4 Skin repair:    Repair method:  Sutures   Suture size:  5-0 and 4-0   Suture material:  Nylon   Suture technique:  Simple interrupted   Number of sutures:  3 Approximation:    Approximation:  Loose Repair type:    Repair type:  Simple Post-procedure details:    Dressing:  Adhesive  bandage    MEDICATIONS ORDERED IN ED: Medications  lidocaine -EPINEPHrine -tetracaine  (LET) topical gel (3 mLs Topical Given 09/20/23 2227)  lidocaine  (PF) (XYLOCAINE ) 1 % injection 5 mL (5 mLs Intradermal Given 09/20/23 2227)     IMPRESSION / MDM / ASSESSMENT AND PLAN / ED COURSE  I reviewed the triage vital signs and the nursing notes.                              Differential diagnosis includes, but is not limited to, laceration, contusion, skin tear, fracture,  Patient presented to the ER for evaluation of left arm injury as described above after mechanical fall.  Did not hit her head.  No neck pain.  No other associated injury or pain.  Has large skin tear and laceration to left upper arm.  Lac repair performed as described above.  Tetanus is up-to-date.  X-ray shoulder on my review and interpretation without evidence of fracture.  There is evidence of degenerative changes.  As wound care was provided with no other complaints patient does appear stable and appropriate for outpatient follow-up.       FINAL CLINICAL IMPRESSION(S) / ED DIAGNOSES   Final diagnoses:  Laceration of arm, left, initial encounter     Rx / DC Orders   ED Discharge Orders     None        Note:  This document was prepared using Dragon voice recognition software and may include unintentional dictation errors.    Lang Dover, MD 09/20/23 [phone]

## 2023-09-20 NOTE — Discharge Instructions (Signed)
 Sutures should be removed by next Sunday 2/16.

## 2023-09-20 NOTE — ED Triage Notes (Signed)
 Pt presents to ER from home, reports she fell and has a skin tear to left upper arm. Pt denies hitting her head. Pt is hard of hearing. Pt talks in complete sentences no distress noted.

## 2023-09-20 NOTE — ED Notes (Signed)
 Patient alert and oriented.  Son at the bedside.  Ambulates with standby assistance.  No signs of distress.  Circulation well LUE.  Dressing clean, Aziz, intact.  Patient and son educated.  No complaints of pain at this time.

## 2023-11-22 DIAGNOSIS — D485 Neoplasm of uncertain behavior of skin: Secondary | ICD-10-CM | POA: Diagnosis not present

## 2023-11-22 DIAGNOSIS — L720 Epidermal cyst: Secondary | ICD-10-CM | POA: Diagnosis not present

## 2023-11-22 DIAGNOSIS — L905 Scar conditions and fibrosis of skin: Secondary | ICD-10-CM | POA: Diagnosis not present

## 2024-01-15 DIAGNOSIS — I10 Essential (primary) hypertension: Secondary | ICD-10-CM | POA: Diagnosis not present

## 2024-01-15 DIAGNOSIS — E782 Mixed hyperlipidemia: Secondary | ICD-10-CM | POA: Diagnosis not present

## 2024-01-22 DIAGNOSIS — M19011 Primary osteoarthritis, right shoulder: Secondary | ICD-10-CM | POA: Diagnosis not present

## 2024-01-22 DIAGNOSIS — E782 Mixed hyperlipidemia: Secondary | ICD-10-CM | POA: Diagnosis not present

## 2024-01-22 DIAGNOSIS — I10 Essential (primary) hypertension: Secondary | ICD-10-CM | POA: Diagnosis not present

## 2024-01-22 DIAGNOSIS — D72829 Elevated white blood cell count, unspecified: Secondary | ICD-10-CM | POA: Diagnosis not present

## 2024-01-22 DIAGNOSIS — M19012 Primary osteoarthritis, left shoulder: Secondary | ICD-10-CM | POA: Diagnosis not present

## 2024-01-24 DIAGNOSIS — M19011 Primary osteoarthritis, right shoulder: Secondary | ICD-10-CM | POA: Diagnosis not present

## 2024-01-24 DIAGNOSIS — Z7982 Long term (current) use of aspirin: Secondary | ICD-10-CM | POA: Diagnosis not present

## 2024-01-24 DIAGNOSIS — Z8673 Personal history of transient ischemic attack (TIA), and cerebral infarction without residual deficits: Secondary | ICD-10-CM | POA: Diagnosis not present

## 2024-01-24 DIAGNOSIS — N1330 Unspecified hydronephrosis: Secondary | ICD-10-CM | POA: Diagnosis not present

## 2024-01-24 DIAGNOSIS — I77819 Aortic ectasia, unspecified site: Secondary | ICD-10-CM | POA: Diagnosis not present

## 2024-01-24 DIAGNOSIS — Z9181 History of falling: Secondary | ICD-10-CM | POA: Diagnosis not present

## 2024-01-24 DIAGNOSIS — Z85828 Personal history of other malignant neoplasm of skin: Secondary | ICD-10-CM | POA: Diagnosis not present

## 2024-01-24 DIAGNOSIS — I10 Essential (primary) hypertension: Secondary | ICD-10-CM | POA: Diagnosis not present

## 2024-01-24 DIAGNOSIS — M19012 Primary osteoarthritis, left shoulder: Secondary | ICD-10-CM | POA: Diagnosis not present

## 2024-01-24 DIAGNOSIS — K5790 Diverticulosis of intestine, part unspecified, without perforation or abscess without bleeding: Secondary | ICD-10-CM | POA: Diagnosis not present

## 2024-01-24 DIAGNOSIS — D72829 Elevated white blood cell count, unspecified: Secondary | ICD-10-CM | POA: Diagnosis not present

## 2024-01-24 DIAGNOSIS — E782 Mixed hyperlipidemia: Secondary | ICD-10-CM | POA: Diagnosis not present

## 2024-01-24 DIAGNOSIS — Z96651 Presence of right artificial knee joint: Secondary | ICD-10-CM | POA: Diagnosis not present

## 2024-01-24 DIAGNOSIS — K649 Unspecified hemorrhoids: Secondary | ICD-10-CM | POA: Diagnosis not present

## 2024-01-24 DIAGNOSIS — M810 Age-related osteoporosis without current pathological fracture: Secondary | ICD-10-CM | POA: Diagnosis not present

## 2024-02-06 ENCOUNTER — Emergency Department
Admission: EM | Admit: 2024-02-06 | Discharge: 2024-02-06 | Disposition: A | Discharge status: Home / Self Care | Attending: Emergency Medicine | Admitting: Emergency Medicine

## 2024-02-06 ENCOUNTER — Other Ambulatory Visit: Payer: Self-pay

## 2024-02-06 ENCOUNTER — Emergency Department

## 2024-02-06 DIAGNOSIS — R410 Disorientation, unspecified: Secondary | ICD-10-CM | POA: Insufficient documentation

## 2024-02-06 DIAGNOSIS — R9431 Abnormal electrocardiogram [ECG] [EKG]: Secondary | ICD-10-CM | POA: Insufficient documentation

## 2024-02-06 DIAGNOSIS — R479 Unspecified speech disturbances: Secondary | ICD-10-CM | POA: Insufficient documentation

## 2024-02-06 DIAGNOSIS — Z7982 Long term (current) use of aspirin: Secondary | ICD-10-CM | POA: Insufficient documentation

## 2024-02-06 DIAGNOSIS — Z79899 Other long term (current) drug therapy: Secondary | ICD-10-CM | POA: Insufficient documentation

## 2024-02-06 DIAGNOSIS — R4702 Dysphasia: Secondary | ICD-10-CM | POA: Diagnosis not present

## 2024-02-06 DIAGNOSIS — I10 Essential (primary) hypertension: Secondary | ICD-10-CM | POA: Diagnosis not present

## 2024-02-06 DIAGNOSIS — R4781 Slurred speech: Secondary | ICD-10-CM | POA: Diagnosis not present

## 2024-02-06 DIAGNOSIS — I4510 Unspecified right bundle-branch block: Secondary | ICD-10-CM | POA: Diagnosis not present

## 2024-02-06 DIAGNOSIS — R4701 Aphasia: Secondary | ICD-10-CM | POA: Diagnosis present

## 2024-02-06 DIAGNOSIS — T679XXA Effect of heat and light, unspecified, initial encounter: Secondary | ICD-10-CM | POA: Diagnosis not present

## 2024-02-06 DIAGNOSIS — G459 Transient cerebral ischemic attack, unspecified: Secondary | ICD-10-CM

## 2024-02-06 DIAGNOSIS — I6782 Cerebral ischemia: Secondary | ICD-10-CM | POA: Diagnosis not present

## 2024-02-06 DIAGNOSIS — Z8673 Personal history of transient ischemic attack (TIA), and cerebral infarction without residual deficits: Secondary | ICD-10-CM | POA: Diagnosis not present

## 2024-02-06 LAB — APTT: aPTT: 34 s (ref 24–36)

## 2024-02-06 LAB — COMPREHENSIVE METABOLIC PANEL WITH GFR
ALT: 14 U/L (ref 0–44)
AST: 19 U/L (ref 15–41)
Albumin: 4.7 g/dL (ref 3.5–5.0)
Alkaline Phosphatase: 55 U/L (ref 38–126)
Anion gap: 13 (ref 5–15)
BUN: 12 mg/dL (ref 8–23)
CO2: 20 mmol/L — ABNORMAL LOW (ref 22–32)
Calcium: 9.5 mg/dL (ref 8.9–10.3)
Chloride: 103 mmol/L (ref 98–111)
Creatinine, Ser: 0.77 mg/dL (ref 0.44–1.00)
GFR, Estimated: >60 mL/min (ref >60)
Glucose, Bld: 105 mg/dL — ABNORMAL HIGH (ref 70–99)
Potassium: 3.4 mmol/L — ABNORMAL LOW (ref 3.5–5.1)
Sodium: 136 mmol/L (ref 135–145)
Total Bilirubin: 1.5 mg/dL — ABNORMAL HIGH (ref 0.0–1.2)
Total Protein: 7.6 g/dL (ref 6.5–8.1)

## 2024-02-06 LAB — CBC
HCT: 42 % (ref 36.0–46.0)
Hemoglobin: 14 g/dL (ref 12.0–15.0)
MCH: 29.7 pg (ref 26.0–34.0)
MCHC: 33.3 g/dL (ref 30.0–36.0)
MCV: 89 fL (ref 80.0–100.0)
Platelets: 229 10*3/uL (ref 150–400)
RBC: 4.72 MIL/uL (ref 3.87–5.11)
RDW: 14.6 % (ref 11.5–15.5)
WBC: 13.9 10*3/uL — ABNORMAL HIGH (ref 4.0–10.5)
nRBC: 0 % (ref 0.0–0.2)

## 2024-02-06 LAB — DIFFERENTIAL
Abs Immature Granulocytes: 0.08 10*3/uL — ABNORMAL HIGH (ref 0.00–0.07)
Basophils Absolute: 0.1 10*3/uL (ref 0.0–0.1)
Basophils Relative: 1 %
Eosinophils Absolute: 0.3 10*3/uL (ref 0.0–0.5)
Eosinophils Relative: 2 %
Immature Granulocytes: 1 %
Lymphocytes Relative: 6 %
Lymphs Abs: 0.8 10*3/uL (ref 0.7–4.0)
Monocytes Absolute: 1 10*3/uL (ref 0.1–1.0)
Monocytes Relative: 7 %
Neutro Abs: 11.7 10*3/uL — ABNORMAL HIGH (ref 1.7–7.7)
Neutrophils Relative %: 83 %

## 2024-02-06 LAB — PROTIME-INR
INR: 1 (ref 0.8–1.2)
Prothrombin Time: 14 s (ref 11.4–15.2)

## 2024-02-06 LAB — CBG MONITORING, ED: Glucose-Capillary: 107 mg/dL — ABNORMAL HIGH (ref 70–99)

## 2024-02-06 LAB — ETHANOL: Alcohol, Ethyl (B): <15 mg/dL (ref <15)

## 2024-02-06 MED ORDER — SODIUM CHLORIDE 0.9% FLUSH: 3.0000 mL | Freq: Once | INTRAVENOUS | Status: DC

## 2024-02-06 NOTE — Consult Note (Signed)
 NEUROLOGY CONSULT NOTE   Date of service: February 06, 2024 Patient Name: Kellie Harris MRN:  [id number] DOB:  May 02, 1931 Chief Complaint: Difficulty with speech Requesting Provider: Floy Roberts, MD  History of Present Illness  Kellie Harris is a 89 y.o. female with a PMHx of Arthritis, skin cancer, DDD of lumbar spine, hypercholesterolemia, HTN, IBS, osteopenia, pancreatic insufficiency, prior stroke and bilateral hearing loss who presents to the ED from home via EMS for new onset of difficulty speaking. LKN was 2:50 PM. She was riding in a car with a family member when she had abrupt onset of expressive aphasia, manifesting as difficulty coming up with words while speaking. Family reported that she had been having decreased PO intake. During triage the patient reported feeling confused. She continued to have difficulty finding words in triage. Code Stroke was called. She is not on any blood thinners. Home medications include ASA.   LKW: 2:50 PM Modified rankin score: 0-Completely asymptomatic and back to baseline post- stroke IV Thrombolysis:  No: Symptoms too mild to treat EVT:  No: Presentation not consistent with LVO  NIHSS components Score: Comment  1a Level of Conscious 0[x]  1[]  2[]  3[]      1b LOC Questions 0[x]  1[]  2[]        1c LOC Commands 0[x]  1[]  2[]       2 Best Gaze 0[x]  1[]  2[]       3 Visual 0[x]  1[]  2[]  3[]      4 Facial Palsy 0[x]  1[]  2[]  3[]      5a Motor Arm - left 0[x]  1[]  2[]  3[]  4[]  UN[]    5b Motor Arm - Right 0[x]  1[]  2[]  3[]  4[]  UN[]    6a Motor Leg - Left 0[x]  1[]  2[]  3[]  4[]  UN[]    6b Motor Leg - Right 0[x]  1[]  2[]  3[]  4[]  UN[]    7 Limb Ataxia 0[x]  1[]  2[]  UN[]      8 Sensory 0[x]  1[]  2[]  UN[]      9 Best Language 0[]  1[x]  2[]  3[]      Subtle word finding difficulty  10 Dysarthria 0[x]  1[]  2[]  UN[]      11 Extinct. and Inattention 0[x]  1[]  2[]       TOTAL:   1      ROS  No headache, vision loss, limb weakness, limb numbness or facial droop. Other ROS as per HPI.    Past History   Past Medical History:  Diagnosis Date   Arthritis    right knee, left shoulder/ OSTEO   Cancer (HCC)    skin    Degenerative disc disease, lumbar    L4-L5   Diverticulosis    ITIS   History of hiatal hernia    Hydronephrosis    right   Hypercholesteremia    Hypertension    CONTROLLED ON MEDS   IBS (irritable bowel syndrome)    Knee fracture, right 03/05/2015   DIFFICULTY WITH AMBULATION   Osteopenia    Ovarian cyst    Pancreatic insufficiency    Stroke [facility])    Wears hearing aid    bilateral   Past Surgical History:  Procedure Laterality Date   CATARACT EXTRACTION W/PHACO Right 11/02/2015   Procedure: CATARACT EXTRACTION PHACO AND INTRAOCULAR LENS PLACEMENT (IOC);  Surgeon: Dene Etienne, MD;  Location: [facility];  Service: Ophthalmology;  Laterality: Right;   CATARACT EXTRACTION W/PHACO Left 07/18/2016   Procedure: CATARACT EXTRACTION PHACO AND INTRAOCULAR LENS PLACEMENT (IOC);  Surgeon: Dene Etienne, MD;  Location: [facility];  Service: Ophthalmology;  Laterality: Left;  COLONOSCOPY     FRACTURE SURGERY Left    hip   HARDWARE REMOVAL Left 09/04/2021   Procedure: HARDWARE REMOVAL;  Surgeon: Leora Lynwood SAUNDERS, MD;  Location: [facility];  Service: Orthopedics;  Laterality: Left;   history of left hip fracture     INTRAMEDULLARY (IM) NAIL INTERTROCHANTERIC Left 02/12/2017   Procedure: INTRAMEDULLARY (IM) NAIL INTERTROCHANTRIC;  Surgeon: Cleotilde Barrio, MD;  Location: [facility];  Service: Orthopedics;  Laterality: Left;   kidney stent     OOPHORECTOMY Bilateral 2015   ORIF WRIST FRACTURE Right 04/08/2018   Procedure: OPEN REDUCTION INTERNAL FIXATION (ORIF) WRIST FRACTURE;  Surgeon: Celena Sharper, MD;  Location: [facility];  Service: Orthopedics;  Laterality: Right;   TONSILLECTOMY     TOTAL HIP ARTHROPLASTY Left 09/04/2021   Procedure: TOTAL HIP ARTHROPLASTY ANTERIOR APPROACH;  Surgeon: Leora Lynwood SAUNDERS, MD;  Location: [facility];   Service: Orthopedics;  Laterality: Left;   TOTAL KNEE ARTHROPLASTY Right 08/07/2017   Procedure: TOTAL KNEE ARTHROPLASTY;  Surgeon: Cleotilde Barrio, MD;  Location: [facility];  Service: Orthopedics;  Laterality: Right;    Family History: Family History  Problem Relation Age of Onset   Breast cancer Mother 43   Leukemia Mother    Stroke Father     Social History  reports that she has never smoked. She has never used smokeless tobacco. She reports that she does not drink alcohol and does not use drugs.  No Known Allergies  Medications   Current Facility-Administered Medications:    sodium chloride  flush (NS) 0.9 % injection 3 mL, 3 mL, Intravenous, Once, Floy Roberts, MD  Current Outpatient Medications:    acetaminophen  (TYLENOL ) 500 MG tablet, Take 1,000 mg by mouth every 8 (eight) hours as needed for mild pain or moderate pain., Disp: , Rfl:    amLODipine  (NORVASC ) 5 MG tablet, Take 5 mg by mouth daily., Disp: , Rfl:    aspirin  81 MG chewable tablet, Chew 1 tablet (81 mg total) by mouth 2 (two) times daily., Disp: 60 tablet, Rfl: 0   atorvastatin  (LIPITOR) 20 MG tablet, Take 20 mg by mouth daily., Disp: , Rfl:    celecoxib  (CELEBREX ) 100 MG capsule, Take 100 mg by mouth daily., Disp: , Rfl:    Cholecalciferol  (VITAMIN D ) 50 MCG (2000 UT) tablet, Take 2,000 Units by mouth daily., Disp: , Rfl:    ferrous sulfate  325 (65 FE) MG tablet, Take 1 tablet (325 mg total) by mouth daily., Disp: 30 tablet, Rfl: 0   latanoprost (XALATAN) 0.005 % ophthalmic solution, SMARTSIG:In Eye(s), Disp: , Rfl:    lisinopril  (ZESTRIL ) 20 MG tablet, Take 1 tablet by mouth 2 (two) times daily. (Patient not taking: Reported on 06/01/2022), Disp: , Rfl:    metoprolol  succinate (TOPROL -XL) 25 MG 24 hr tablet, Take 12.5 mg by mouth daily., Disp: , Rfl:    pravastatin  (PRAVACHOL ) 40 MG tablet, Take 40 mg by mouth at bedtime. (Patient not taking: Reported on 06/01/2022), Disp: , Rfl:    tamsulosin  (FLOMAX ) 0.4 MG  CAPS capsule, Take 1 capsule (0.4 mg total) by mouth daily after supper. (Patient not taking: Reported on 06/01/2022), Disp: 30 capsule, Rfl:    vitamin B-12 1000 MCG tablet, Take 1 tablet (1,000 mcg total) by mouth daily., Disp: 30 tablet, Rfl: 0  No current facility-administered medications on file prior to encounter.   Current Outpatient Medications on File Prior to Encounter  Medication Sig Dispense Refill   acetaminophen  (TYLENOL ) 500 MG tablet Take 1,000 mg by mouth  every 8 (eight) hours as needed for mild pain or moderate pain.     amLODipine  (NORVASC ) 5 MG tablet Take 5 mg by mouth daily.     aspirin  81 MG chewable tablet Chew 1 tablet (81 mg total) by mouth 2 (two) times daily. 60 tablet 0   atorvastatin  (LIPITOR) 20 MG tablet Take 20 mg by mouth daily.     celecoxib  (CELEBREX ) 100 MG capsule Take 100 mg by mouth daily.     Cholecalciferol  (VITAMIN D ) 50 MCG (2000 UT) tablet Take 2,000 Units by mouth daily.     ferrous sulfate  325 (65 FE) MG tablet Take 1 tablet (325 mg total) by mouth daily. 30 tablet 0   latanoprost (XALATAN) 0.005 % ophthalmic solution SMARTSIG:In Eye(s)     lisinopril  (ZESTRIL ) 20 MG tablet Take 1 tablet by mouth 2 (two) times daily. (Patient not taking: Reported on 06/01/2022)     metoprolol  succinate (TOPROL -XL) 25 MG 24 hr tablet Take 12.5 mg by mouth daily.     pravastatin  (PRAVACHOL ) 40 MG tablet Take 40 mg by mouth at bedtime. (Patient not taking: Reported on 06/01/2022)     tamsulosin  (FLOMAX ) 0.4 MG CAPS capsule Take 1 capsule (0.4 mg total) by mouth daily after supper. (Patient not taking: Reported on 06/01/2022) 30 capsule    vitamin B-12 1000 MCG tablet Take 1 tablet (1,000 mcg total) by mouth daily. 30 tablet 0     Vitals   Vitals:   02/06/24 1628  BP: (!) 167/104  Pulse: 89  Resp: 20  Temp: 98.5 F (36.9 C)  TempSrc: Oral  SpO2: 96%    There is no height or weight on file to calculate BMI.   Physical Exam   Constitutional: Appears  well-developed and well-nourished.  Psych: Affect appropriate to situation.  Eyes: No scleral injection.  HENT: No OP obstruction.  Head: Normocephalic.  Respiratory: Effort normal, non-labored breathing.    Neurologic Examination   See NIHSS.   Labs/Imaging/Neurodiagnostic studies   CBC: No results for input(s): WBC, NEUTROABS, HGB, HCT, MCV, PLT in the last 168 hours. Basic Metabolic Panel:  Lab Results  Component Value Date   NA 139 06/01/2022   K 3.3 (L) 06/01/2022   CO2 23 06/01/2022   GLUCOSE 112 (H) 06/01/2022   BUN 11 06/01/2022   CREATININE 0.70 06/01/2022   CALCIUM  9.3 06/01/2022   GFRNONAA >60 06/01/2022   GFRAA >60 05/04/2020   Lipid Panel:  Lab Results  Component Value Date   LDLCALC 99 06/13/2021   HgbA1c:  Lab Results  Component Value Date   HGBA1C 5.0 06/13/2021   Urine Drug Screen:     Component Value Date/Time   LABOPIA NONE DETECTED 06/12/2021 1825   COCAINSCRNUR NONE DETECTED 06/12/2021 1825   LABBENZ NONE DETECTED 06/12/2021 1825   AMPHETMU NONE DETECTED 06/12/2021 1825   THCU NONE DETECTED 06/12/2021 1825   LABBARB NONE DETECTED 06/12/2021 1825    Alcohol Level     Component Value Date/Time   ETH <10 06/01/2022 1429   INR  Lab Results  Component Value Date   INR 1.0 06/01/2022   APTT  Lab Results  Component Value Date   APTT 31 06/01/2022     ASSESSMENT  89 year old female with a PMHx of Arthritis, skin cancer, DDD of lumbar spine, hypercholesterolemia, HTN, IBS, osteopenia, pancreatic insufficiency, prior stroke and bilateral hearing loss who presents to the ED from home via EMS for new onset of difficulty speaking. LKN was 2:50  PM. She was riding in a car with a family member when she had abrupt onset of expressive aphasia, manifesting as difficulty coming up with words while speaking. Family reported that she had been having decreased PO intake. During triage the patient reported feeling confused. She continued to  have difficulty finding words in triage. Code Stroke was called. She is not on any blood thinners. Home medications include ASA.  - Exam reveals subtle word-finding difficulty. NIHSS 1.  - CT head: No CT evidence of acute intracranial abnormality. Extensive chronic microvascular ischemic changes and moderate parenchymal volume loss. 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 ASPECTS is 10 - EKG: Sinus rhythm with occasional Premature ventricular complexes; Pulmonary disease pattern; Right bundle branch block; Left anterior fascicular block; Bifascicular block - Labs: Elevated WBC of 13.9. Na and Ca normal. Mild hypokalemia. Normal eGFR. Mildly elevated total bilirubin. AST and ALT normal. Glucose 105. EtOH < 15.  - Risks of IV thrombolysis significantly outweigh potential benefits. Symptoms too mild to treat. - Impression: DDx includes transient confusion and TIA. Symptoms could be localizable to Broca's area (left frontal operculum).     RECOMMENDATIONS  - Admission for TIA work up was recommended. However, the patient's daughter was hesitant to admit for work up. Per daughter, she has had similar presentations in the past and has had TIA work up multiple times before without concerning findings.  - Continue ASA.  - Outpatient Neurology follow up.    ______________________________________________________________________    Bonney SHARK, Maxton Noreen, MD [facility]

## 2024-02-06 NOTE — Progress Notes (Signed)
 CODE STROKE- PHARMACY COMMUNICATION   Time CODE STROKE called/page received: 1641  Time response to CODE STROKE was made (in person or via phone): 1644, in person  Time Stroke Kit retrieved from Pyxis (only if needed): No thrombolytic per neurologist  Name of Provider/Nurse contacted: Dr. Merrianne  Past Medical History:  Diagnosis Date   Arthritis    right knee, left shoulder/ OSTEO   Cancer (HCC)    skin    Degenerative disc disease, lumbar    L4-L5   Diverticulosis    ITIS   History of hiatal hernia    Hydronephrosis    right   Hypercholesteremia    Hypertension    CONTROLLED ON MEDS   IBS (irritable bowel syndrome)    Knee fracture, right 03/05/2015   DIFFICULTY WITH AMBULATION   Osteopenia    Ovarian cyst    Pancreatic insufficiency    Stroke [facility])    Wears hearing aid    bilateral   Prior to Admission medications   Medication Sig Start Date End Date Taking? Authorizing Provider  acetaminophen  (TYLENOL ) 500 MG tablet Take 1,000 mg by mouth every 8 (eight) hours as needed for mild pain or moderate pain.    [provider]  amLODipine  (NORVASC ) 5 MG tablet Take 5 mg by mouth daily. 03/13/21   [provider]  aspirin  81 MG chewable tablet Chew 1 tablet (81 mg total) by mouth 2 (two) times daily. 09/06/21   Joshua Lin, PA-C  atorvastatin  (LIPITOR) 20 MG tablet Take 20 mg by mouth daily. 05/07/22   [provider]  celecoxib  (CELEBREX ) 100 MG capsule Take 100 mg by mouth daily. 04/13/22   [provider]  Cholecalciferol  (VITAMIN D ) 50 MCG (2000 UT) tablet Take 2,000 Units by mouth daily.    [provider]  ferrous sulfate  325 (65 FE) MG tablet Take 1 tablet (325 mg total) by mouth daily. 09/10/21 10/10/21  Laurita Pillion, MD  latanoprost (XALATAN) 0.005 % ophthalmic solution SMARTSIG:In Eye(s) 05/21/22   [provider]  lisinopril  (ZESTRIL ) 20 MG tablet Take 1 tablet by mouth 2 (two) times daily. Patient not taking:  Reported on 06/01/2022 05/02/21   [provider]  metoprolol  succinate (TOPROL -XL) 25 MG 24 hr tablet Take 12.5 mg by mouth daily. 03/02/21   [provider]  pravastatin  (PRAVACHOL ) 40 MG tablet Take 40 mg by mouth at bedtime. Patient not taking: Reported on 06/01/2022 06/05/21   [provider]  tamsulosin  (FLOMAX ) 0.4 MG CAPS capsule Take 1 capsule (0.4 mg total) by mouth daily after supper. Patient not taking: Reported on 06/01/2022 09/10/21   Laurita Pillion, MD  vitamin B-12 1000 MCG tablet Take 1 tablet (1,000 mcg total) by mouth daily. 09/10/21   Laurita Pillion, MD    Elsie CHRISTELLA Piety ,PharmD Clinical Pharmacist  02/06/2024  4:43 PM

## 2024-02-06 NOTE — ED Provider Notes (Signed)
 [facility] Provider Note    Event Date/Time   First MD Initiated Contact with Patient 02/06/24 1646     (approximate)   History   Aphasia (/)   HPI  Kellie Harris is a 89 y.o. female who presents to the emergency department today because of concerns for difficulty with speech.  Patient was called a code stroke. At the time of my exam the patient states that she feels her speech is better. She denies any preceding illness today. Daughter at bedside says that patient has had similar presentations in the past and has been worked up for TIAs roughly half a dozen times without any clear etiology or concerning findings.      Physical Exam   Triage Vital Signs: ED Triage Vitals [02/06/24 1628]  Encounter Vitals Group     BP (!) 167/104     Girls Systolic BP Percentile      Girls Diastolic BP Percentile      Boys Systolic BP Percentile      Boys Diastolic BP Percentile      Pulse Rate 89     Resp 20     Temp 98.5 F (36.9 C)     Temp Source Oral     SpO2 96 %     Weight      Height      Head Circumference      Peak Flow      Pain Score 0     Pain Loc      Pain Education      Exclude from Growth Chart     Most recent vital signs: Vitals:   02/06/24 1628  BP: (!) 167/104  Pulse: 89  Resp: 20  Temp: 98.5 F (36.9 C)  SpO2: 96%   General: Awake, alert, oriented. CV:  Good peripheral perfusion. Regular rate and rhythm. Resp:  Normal effort. Lungs clear. Abd:  No distention.    ED Results / Procedures / Treatments   Labs (all labs ordered are listed, but only abnormal results are displayed) Labs Reviewed  CBG MONITORING, ED - Abnormal; Notable for the following components:      Result Value   Glucose-Capillary 107 (*)    All other components within normal limits  PROTIME-INR  APTT  CBC  DIFFERENTIAL  COMPREHENSIVE METABOLIC PANEL WITH GFR  ETHANOL  I-STAT CREATININE, ED  CBG MONITORING, ED     EKG  I, Guadalupe Eagles,  attending physician, personally viewed and interpreted this EKG  EKG Time: 1626 Rate: 86 Rhythm: sinus rhythm with PVC Axis: left axis deviation Intervals: qtc 478 QRS: RBBB, LAFB ST changes: no st elevation Impression: abnormal ekg    RADIOLOGY I independently interpreted and visualized the CT head. My interpretation: No ICH Radiology interpretation:  IMPRESSION:  1. No CT evidence of acute intracranial abnormality.  2. Extensive chronic microvascular ischemic changes and moderate  parenchymal volume loss.  3. Remote infarcts in the bilateral thalami, left occipital lobe,  and right cerebellum.  4. ASPECTS is 10      PROCEDURES:  Critical Care performed: No    MEDICATIONS ORDERED IN ED: Medications  sodium chloride  flush (NS) 0.9 % injection 3 mL (has no administration in time range)     IMPRESSION / MDM / ASSESSMENT AND PLAN / ED COURSE  I reviewed the triage vital signs and the nursing notes.  Differential diagnosis includes, but is not limited to, CVA, TIA, infection, electrolyte abnormaility  Patient's presentation is most consistent with acute presentation with potential threat to life or bodily function.   The patient is on the cardiac monitor to evaluate for evidence of arrhythmia and/or significant heart rate changes.  Patient presented to the emergency department today because of concerns for difficulty with speech.  Patient was called a code stroke from triage.  Dr. Lindzen with neurology evaluated the patient.  Did not feel patient would benefit from TNK.  Did recommend TIA workup.  I discussed this with patient and family.  They state the patient has been worked up for TIAs many times in the past.  Has had similar symptoms many times in the past.  They stated they did not want to stay for TIA workup.  Will plan on discharging to follow-up with primary care.    FINAL CLINICAL IMPRESSION(S) / ED DIAGNOSES   Final  diagnoses:  Difficulty with speech     Note:  This document was prepared using Dragon voice recognition software and may include unintentional dictation errors.    Floy Roberts, MD 02/06/24 [phone]

## 2024-02-06 NOTE — ED Notes (Signed)
 [facility] called for codestroke per RN Burnard , spoke with [facility]

## 2024-02-06 NOTE — Progress Notes (Addendum)
 Code stroke cart activated at 1640. Pt already in CT. Teleneurology paged at 1641. Dr. Lindzen in CT to assess pt at 1654.  Hunter Galt, Multimedia programmer

## 2024-02-06 NOTE — ED Triage Notes (Addendum)
 Pt to ED via [facility] from home. EMS reports pt had an episode of aphasia coming home and was back to baseline on their arrival. Family reports decreased PO intake. During triage pt reports feeling confused. Pt has difficulty finding words in triage. Denies HA, denies vision changes, weakness or numbness/tingling. Pt states confusion started 1hr PTA. Pt states speech still feels off. Pt unable to name phone or pen. Pt able to name tshirt. No blood thinners.   EMS reports LKW 1450.

## 2024-02-13 DIAGNOSIS — I10 Essential (primary) hypertension: Secondary | ICD-10-CM | POA: Diagnosis not present

## 2024-02-13 DIAGNOSIS — Z8673 Personal history of transient ischemic attack (TIA), and cerebral infarction without residual deficits: Secondary | ICD-10-CM | POA: Diagnosis not present

## 2024-02-14 ENCOUNTER — Emergency Department: Admitting: Certified Registered Nurse Anesthetist

## 2024-02-14 ENCOUNTER — Other Ambulatory Visit: Payer: Self-pay

## 2024-02-14 ENCOUNTER — Ambulatory Visit
Admission: EM | Admit: 2024-02-14 | Discharge: 2024-02-14 | Disposition: A | Discharge status: Home / Self Care | Attending: Emergency Medicine | Admitting: Emergency Medicine

## 2024-02-14 ENCOUNTER — Encounter
Admission: EM | Disposition: A | Discharge status: Home / Self Care | Payer: Self-pay | Source: Home / Self Care | Attending: Emergency Medicine

## 2024-02-14 DIAGNOSIS — Z8673 Personal history of transient ischemic attack (TIA), and cerebral infarction without residual deficits: Secondary | ICD-10-CM | POA: Diagnosis not present

## 2024-02-14 DIAGNOSIS — Z96642 Presence of left artificial hip joint: Secondary | ICD-10-CM | POA: Diagnosis not present

## 2024-02-14 DIAGNOSIS — W44F3XA Food entering into or through a natural orifice, initial encounter: Secondary | ICD-10-CM | POA: Insufficient documentation

## 2024-02-14 DIAGNOSIS — E785 Hyperlipidemia, unspecified: Secondary | ICD-10-CM | POA: Diagnosis not present

## 2024-02-14 DIAGNOSIS — K222 Esophageal obstruction: Secondary | ICD-10-CM | POA: Diagnosis not present

## 2024-02-14 DIAGNOSIS — T18128A Food in esophagus causing other injury, initial encounter: Secondary | ICD-10-CM | POA: Diagnosis not present

## 2024-02-14 DIAGNOSIS — K2289 Other specified disease of esophagus: Secondary | ICD-10-CM | POA: Diagnosis not present

## 2024-02-14 DIAGNOSIS — I10 Essential (primary) hypertension: Secondary | ICD-10-CM | POA: Diagnosis not present

## 2024-02-14 DIAGNOSIS — E7800 Pure hypercholesterolemia, unspecified: Secondary | ICD-10-CM | POA: Diagnosis not present

## 2024-02-14 DIAGNOSIS — T18108A Unspecified foreign body in esophagus causing other injury, initial encounter: Secondary | ICD-10-CM | POA: Diagnosis not present

## 2024-02-14 DIAGNOSIS — Z96651 Presence of right artificial knee joint: Secondary | ICD-10-CM | POA: Diagnosis not present

## 2024-02-14 HISTORY — PX: ESOPHAGOGASTRODUODENOSCOPY: SHX5428

## 2024-02-14 SURGERY — EGD (ESOPHAGOGASTRODUODENOSCOPY)
Anesthesia: General

## 2024-02-14 MED ORDER — LIDOCAINE HCL (PF) 2 % IJ SOLN
INTRAMUSCULAR | Status: AC
  Filled 2024-02-14: qty 5

## 2024-02-14 MED ORDER — FENTANYL CITRATE (PF) 100 MCG/2ML IJ SOLN
INTRAMUSCULAR | Status: AC
  Filled 2024-02-14: qty 2

## 2024-02-14 MED ORDER — ONDANSETRON HCL 4 MG/2ML IJ SOLN
INTRAMUSCULAR | Status: DC | PRN
  Administered 2024-02-14: 4 mg via INTRAVENOUS

## 2024-02-14 MED ORDER — SODIUM CHLORIDE 0.9 % IV SOLN: INTRAVENOUS | Status: DC

## 2024-02-14 MED ORDER — LIDOCAINE HCL (CARDIAC) PF 100 MG/5ML IV SOSY
PREFILLED_SYRINGE | INTRAVENOUS | Status: DC | PRN
  Administered 2024-02-14: 60 mg via INTRAVENOUS

## 2024-02-14 MED ORDER — PANTOPRAZOLE SODIUM 20 MG PO TBEC: 20.0000 mg | DELAYED_RELEASE_TABLET | Freq: Every day | ORAL | 1 refills | Status: DC

## 2024-02-14 MED ORDER — DEXAMETHASONE SODIUM PHOSPHATE 10 MG/ML IJ SOLN
INTRAMUSCULAR | Status: DC | PRN
  Administered 2024-02-14: 10 mg via INTRAVENOUS

## 2024-02-14 MED ORDER — PROPOFOL 10 MG/ML IV BOLUS
INTRAVENOUS | Status: AC
  Filled 2024-02-14: qty 20

## 2024-02-14 MED ORDER — PROPOFOL 10 MG/ML IV BOLUS
INTRAVENOUS | Status: DC | PRN
  Administered 2024-02-14: 80 mg via INTRAVENOUS

## 2024-02-14 MED ORDER — ONDANSETRON HCL 4 MG/2ML IJ SOLN
INTRAMUSCULAR | Status: AC
  Filled 2024-02-14: qty 2

## 2024-02-14 MED ORDER — SUCCINYLCHOLINE CHLORIDE 200 MG/10ML IV SOSY
PREFILLED_SYRINGE | INTRAVENOUS | Status: DC | PRN
  Administered 2024-02-14: 80 mg via INTRAVENOUS

## 2024-02-14 NOTE — ED Notes (Signed)
 See triage note States she felt like she choked on chicken last pm ..Then this am  Not able to keep anything

## 2024-02-14 NOTE — Consult Note (Signed)
 Consultation  Referring Provider:    ED  Admit date: 02/14/2024 Consult date: 02/14/2024         Reason for Consultation: Food Impaction              HPI:   Kellie Harris is a 89 y.o. lady with history of arthritis, hypertension, and hip/knee replacement here for concern for food impaction from a piece of chicken. Episode occurred last night. She felt it get stuck and then liquids would not go down. She denies eating anything else afterwards beside some soup. No blood thinners. No neck surgery. No recent history of dysphagia or GERD.   Past Medical History:  Diagnosis Date   Arthritis    right knee, left shoulder/ OSTEO   Cancer (HCC)    skin    Degenerative disc disease, lumbar    L4-L5   Diverticulosis    ITIS   History of hiatal hernia    Hydronephrosis    right   Hypercholesteremia    Hypertension    CONTROLLED ON MEDS   IBS (irritable bowel syndrome)    Knee fracture, right 03/05/2015   DIFFICULTY WITH AMBULATION   Osteopenia    Ovarian cyst    Pancreatic insufficiency    Stroke [facility]) 2025   unsure if TIA or stroke, no deficits   Wears hearing aid    bilateral    Past Surgical History:  Procedure Laterality Date   CATARACT EXTRACTION W/PHACO Right 11/02/2015   Procedure: CATARACT EXTRACTION PHACO AND INTRAOCULAR LENS PLACEMENT (IOC);  Surgeon: Dene Etienne, MD;  Location: [facility];  Service: Ophthalmology;  Laterality: Right;   CATARACT EXTRACTION W/PHACO Left 07/18/2016   Procedure: CATARACT EXTRACTION PHACO AND INTRAOCULAR LENS PLACEMENT (IOC);  Surgeon: Dene Etienne, MD;  Location: [facility];  Service: Ophthalmology;  Laterality: Left;   COLONOSCOPY     FRACTURE SURGERY Left    hip   HARDWARE REMOVAL Left 09/04/2021   Procedure: HARDWARE REMOVAL;  Surgeon: Leora Lynwood SAUNDERS, MD;  Location: [facility] ORS;  Service: Orthopedics;  Laterality: Left;   history of left hip fracture     INTRAMEDULLARY (IM) NAIL INTERTROCHANTERIC Left 02/12/2017    Procedure: INTRAMEDULLARY (IM) NAIL INTERTROCHANTRIC;  Surgeon: Cleotilde Barrio, MD;  Location: [facility] OR[facility];  Service: Orthopedics;  Laterality: Left;   kidney stent     OOPHORECTOMY Bilateral 2015   ORIF WRIST FRACTURE Right 04/08/2018   Procedure: OPEN REDUCTION INTERNAL FIXATION (ORIF) WRIST FRACTURE;  Surgeon: Celena Sharper, MD;  Location: [facility] OR;  Service: Orthopedics;  Laterality: Right;   TONSILLECTOMY     TOTAL HIP ARTHROPLASTY Left 09/04/2021   Procedure: TOTAL HIP ARTHROPLASTY ANTERIOR APPROACH;  Surgeon: Leora Lynwood SAUNDERS, MD;  Location: [facility] OR[facility];  Service: Orthopedics;  Laterality: Left;   TOTAL KNEE ARTHROPLASTY Right 08/07/2017   Procedure: TOTAL KNEE ARTHROPLASTY;  Surgeon: Cleotilde Barrio, MD;  Location: [facility] OR[facility];  Service: Orthopedics;  Laterality: Right;    Family History  Problem Relation Age of Onset   Breast cancer Mother 46   Leukemia Mother    Stroke Father    Social History   Tobacco Use   Smoking status: Never   Smokeless tobacco: Never  Vaping Use   Vaping status: Never Used  Substance Use Topics   Alcohol use: No   Drug use: No    Prior to Admission medications   Medication Sig Start Date End Date Taking? Authorizing Provider  acetaminophen  (TYLENOL ) 500 MG tablet Take 1,000 mg by mouth every  8 (eight) hours as needed for mild pain or moderate pain.    [provider]  amLODipine  (NORVASC ) 5 MG tablet Take 5 mg by mouth daily. 03/13/21   [provider]  aspirin  81 MG chewable tablet Chew 1 tablet (81 mg total) by mouth 2 (two) times daily. 09/06/21   Joshua Lin, PA-C  atorvastatin  (LIPITOR) 20 MG tablet Take 20 mg by mouth daily. 05/07/22   [provider]  celecoxib  (CELEBREX ) 100 MG capsule Take 100 mg by mouth daily. 04/13/22   [provider]  Cholecalciferol  (VITAMIN D ) 50 MCG (2000 UT) tablet Take 2,000 Units by mouth daily.    [provider]  ferrous sulfate  325 (65 FE) MG tablet Take 1 tablet (325 mg total)  by mouth daily. 09/10/21 10/10/21  Laurita Pillion, MD  latanoprost  (XALATAN ) 0.005 % ophthalmic solution SMARTSIG:In Eye(s) 05/21/22   [provider]  lisinopril  (ZESTRIL ) 20 MG tablet Take 1 tablet by mouth 2 (two) times daily. Patient not taking: Reported on 06/01/2022 05/02/21   [provider]  metoprolol  succinate (TOPROL -XL) 25 MG 24 hr tablet Take 12.5 mg by mouth daily. 03/02/21   [provider]  pravastatin  (PRAVACHOL ) 40 MG tablet Take 40 mg by mouth at bedtime. Patient not taking: Reported on 06/01/2022 06/05/21   [provider]  tamsulosin  (FLOMAX ) 0.4 MG CAPS capsule Take 1 capsule (0.4 mg total) by mouth daily after supper. Patient not taking: Reported on 06/01/2022 09/10/21   Laurita Pillion, MD  vitamin B-12 1000 MCG tablet Take 1 tablet (1,000 mcg total) by mouth daily. 09/10/21   Laurita Pillion, MD    No current facility-administered medications for this encounter.    Allergies as of 02/14/2024   (No Known Allergies)     Review of Systems:    All systems reviewed and negative except where noted in HPI.  Gen: Denies any fever, chills, sweats, anorexia, fatigue, weakness, malaise, weight loss, and sleep disorder CV: Denies chest pain, angina, palpitations, syncope, orthopnea, PND, peripheral edema, and claudication. Resp: Denies dyspnea at rest, dyspnea with exercise, cough, sputum, wheezing, coughing up blood, and pleurisy. GI: Denies vomiting blood, jaundice, and fecal incontinence.   Denies dysphagia or odynophagia. GU : Denies urinary burning, blood in urine, urinary frequency, urinary hesitancy, nocturnal urination, and urinary incontinence. MS: Denies joint pain, limitation of movement, and swelling, stiffness, low back pain, extremity pain. Denies muscle weakness, cramps, atrophy.  Derm: Denies rash, itching, Beier skin, hives, moles, warts, or unhealing ulcers.  Psych: Denies depression, anxiety, memory loss, suicidal ideation,  hallucinations, paranoia, and confusion. Heme: Denies bruising, bleeding, and enlarged lymph nodes. Neuro:  Denies any headaches, dizziness, paresthesias. Endo:  Denies any problems with DM, thyroid , adrenal function.    Physical Exam:  Vital signs in last 24 hours: Temp:  [98.1 F (36.7 C)-98.2 F (36.8 C)] 98.2 F (36.8 C) (07/04 1720) Pulse Rate:  [80-90] 80 (07/04 1720) Resp:  [16-20] 16 (07/04 1720) BP: (161-168)/(94-96) 161/94 (07/04 1720) SpO2:  [100 %] 100 % (07/04 1720) Weight:  [48.1 kg] 48.1 kg (07/04 1351)   General:   Pleasant in NAD Head:  Normocephalic and atraumatic. Eyes:   No icterus.   Conjunctiva pink. Ears:  Hard of hearing Mouth: Mucosa pink moist, no lesions. Neck:  Supple; no masses felt Lungs:  No respiratory distress Abdomen:   Flat, soft, nondistended, nontender Rectal:  Not performed.  Msk:  No clubbing or cyanosis. Neurologic:  Alert and  oriented x4;  No focal deficits Skin:  Warm, [place], pink without significant lesions or rashes. Psych:  Alert and cooperative. Normal affect.  LAB RESULTS: No results for input(s): WBC, HGB, HCT, PLT in the last 72 hours. BMET No results for input(s): NA, K, CL, CO2, GLUCOSE, BUN, CREATININE, CALCIUM  in the last 72 hours. LFT No results for input(s): PROT, ALBUMIN, AST, ALT, ALKPHOS, BILITOT, BILIDIR, IBILI in the last 72 hours. PT/INR No results for input(s): LABPROT, INR in the last 72 hours.  STUDIES: No results found.     Impression / Plan:   89 y/o lady with history of arthritis, hypertension, and hip/knee replacement here for concern for food impaction from a piece of chicken  - npo - plan for EGD today - further recs after procedure  Frederic Schick MD, MPH [facility] GI

## 2024-02-14 NOTE — ED Triage Notes (Signed)
 Pt to ED for globus sensation since last night. Denies hx of same. Pt tried some soup this AM but spit it back up. Pt believes she successfully swallowed BP meds this AM. Unsure if anything stuck in throat at the moment. Respirations are unlabored.

## 2024-02-14 NOTE — Op Note (Signed)
 [facility] Gastroenterology Patient Name: Kellie Harris Procedure Date: 02/14/2024 6:03 PM MRN: [id number] Account #: [id number] Date of Birth: [birth year] Admit Type: Outpatient Age: 89 Room: [facility] ENDO ROOM 1 Gender: Female Note Status: Finalized Instrument Name: Upper Endoscope [id number] Procedure:             Upper GI endoscopy Indications:           Foreign body in the esophagus Providers:             Ole Schick MD, MD Referring MD:          Alda Carpen (Referring MD) Medicines:             Monitored Anesthesia Care Complications:         No immediate complications. Estimated blood loss:                         Minimal. Procedure:             Pre-Anesthesia Assessment:                        - Prior to the procedure, a History and Physical was                         performed, and patient medications and allergies were                         reviewed. The patient is competent. The risks and                         benefits of the procedure and the sedation options and                         risks were discussed with the patient. All questions                         were answered and informed consent was obtained.                         Patient identification and proposed procedure were                         verified by the physician, the nurse, the                         anesthesiologist, the anesthetist and the technician                         in the endoscopy suite. Mental Status Examination:                         alert and oriented. Airway Examination: normal                         oropharyngeal airway and neck mobility. Respiratory                         Examination: clear to auscultation. CV Examination:  normal. Prophylactic Antibiotics: The patient does not                         require prophylactic antibiotics. Prior                         Anticoagulants: The patient has taken no anticoagulant                          or antiplatelet agents. ASA Grade Assessment: III - A                         patient with severe systemic disease. After reviewing                         the risks and benefits, the patient was deemed in                         satisfactory condition to undergo the procedure. The                         anesthesia plan was to use general anesthesia.                         Immediately prior to administration of medications,                         the patient was re-assessed for adequacy to receive                         sedatives. The heart rate, respiratory rate, oxygen                         saturations, blood pressure, adequacy of pulmonary                         ventilation, and response to care were monitored                         throughout the procedure. The physical status of the                         patient was re-assessed after the procedure.                        After obtaining informed consent, the endoscope was                         passed under direct vision. Throughout the procedure,                         the patient's blood pressure, pulse, and oxygen                         saturations were monitored continuously. The Endoscope                         was introduced through the mouth, and advanced to the  second part of duodenum. The upper GI endoscopy was                         accomplished without difficulty. The patient tolerated                         the procedure well. Findings:      The lumen of the upper third of the esophagus and middle third of the       esophagus was moderately dilated.      Food was found in the lower third of the esophagus. The food was       extremely soft and was gently pushed into the stomach. There was a       stricture at the GE junction.      One benign-appearing, intrinsic moderate stenosis was found. This       stenosis measured 9 mm (inner diameter) x less than one cm (in length).        The stenosis was traversed.      The entire examined stomach was normal.      The examined duodenum was normal. Impression:            - Dilation in the upper third of the esophagus and in                         the middle third of the esophagus.                        - Food in the lower third of the esophagus.                        - Benign-appearing esophageal stenosis.                        - Normal stomach.                        - Normal examined duodenum.                        - No specimens collected. Recommendation:        - Discharge patient to home.                        - Soft diet for 3 days.                        - Use a proton pump inhibitor PO daily.                        - Repeat upper endoscopy in 2 months for retreatment.                        - Return to GI clinic at appointment to be scheduled. Procedure Code(s):     --- Professional ---                        [phone], Esophagogastroduodenoscopy, flexible,                         transoral; diagnostic, including collection  of                         specimen(s) by brushing or washing, when performed                         (separate procedure) Diagnosis Code(s):     --- Professional ---                        K22.89, Other specified disease of esophagus                        T18.128A, Food in esophagus causing other injury,                         initial encounter                        K22.2, Esophageal obstruction                        T18.108A, Unspecified foreign body in esophagus                         causing other injury, initial encounter CPT copyright 2022 American Medical Association. All rights reserved. The codes documented in this report are preliminary and upon coder review may  be revised to meet current compliance requirements. Ole Schick MD, MD 02/14/2024 6:30:57 PM Number of Addenda: 0 Note Initiated On: 02/14/2024 6:03 PM Estimated Blood Loss:  Estimated blood loss was  minimal.      [facility]

## 2024-02-14 NOTE — Anesthesia Postprocedure Evaluation (Signed)
 Anesthesia Post Note  Patient: Kellie Harris  Procedure(s) Performed: EGD (ESOPHAGOGASTRODUODENOSCOPY)  Patient location during evaluation: Endoscopy Anesthesia Type: General Level of consciousness: awake and alert Pain management: pain level controlled Vital Signs Assessment: post-procedure vital signs reviewed and stable Respiratory status: spontaneous breathing, nonlabored ventilation and respiratory function stable Cardiovascular status: blood pressure returned to baseline and stable Postop Assessment: no apparent nausea or vomiting Anesthetic complications: no   No notable events documented.   Last Vitals:  Vitals:   02/14/24 1846 02/14/24 1851  BP: (!) 177/88 (!) 171/84  Pulse: 87 83  Resp: 14 16  Temp:    SpO2: 96% 98%    Last Pain:  Vitals:   02/14/24 1851  TempSrc:   PainSc: 0-No pain                 Fairy POUR Andy Allende

## 2024-02-14 NOTE — Transfer of Care (Signed)
 Immediate Anesthesia Transfer of Care Note  Patient: Kellie Harris  Procedure(s) Performed: EGD (ESOPHAGOGASTRODUODENOSCOPY)  Patient Location: Endoscopy Unit  Anesthesia Type:General  Level of Consciousness: awake, alert , and oriented  Airway & Oxygen Therapy: Patient Spontanous Breathing and Patient connected to nasal cannula oxygen  Post-op Assessment: Report given to RN and Post -op Vital signs reviewed and stable  Post vital signs: Reviewed and stable  Last Vitals:  Vitals Value Taken Time  BP    Temp    Pulse    Resp    SpO2      Last Pain:  Vitals:   02/14/24 1754  TempSrc: Temporal  PainSc: 0-No pain         Complications: No notable events documented.

## 2024-02-14 NOTE — ED Notes (Signed)
 Pt undressed. Placed in gown. Clothing bagged and given to daughter.

## 2024-02-14 NOTE — Anesthesia Preprocedure Evaluation (Signed)
 Anesthesia Evaluation  Patient identified by MRN, date of birth, ID band Patient awake    Reviewed: Allergy & Precautions, NPO status , Patient's Chart, lab work & pertinent test results  History of Anesthesia Complications Negative for: history of anesthetic complications  Airway Mallampati: III  TM Distance: <3 FB Neck ROM: full    Dental  (+) Chipped   Pulmonary neg pulmonary ROS, neg shortness of breath, neg COPD   Pulmonary exam normal        Cardiovascular Exercise Tolerance: Good hypertension, (-) angina (-) Past MI Normal cardiovascular exam     Neuro/Psych Seizures -,  TIACVA  negative psych ROS   GI/Hepatic negative GI ROS, Neg liver ROS,,,  Endo/Other  negative endocrine ROS    Renal/GU Renal disease     Musculoskeletal   Abdominal   Peds  Hematology negative hematology ROS (+)   Anesthesia Other Findings Food bolus  Past Medical History: No date: Arthritis     Comment:  right knee, left shoulder/ OSTEO No date: Cancer (HCC)     Comment:  skin  No date: Degenerative disc disease, lumbar     Comment:  L4-L5 No date: Diverticulosis     Comment:  ITIS No date: History of hiatal hernia No date: Hydronephrosis     Comment:  right No date: Hypercholesteremia No date: Hypertension     Comment:  CONTROLLED ON MEDS No date: IBS (irritable bowel syndrome) 03/05/2015: Knee fracture, right     Comment:  DIFFICULTY WITH AMBULATION No date: Osteopenia No date: Ovarian cyst No date: Pancreatic insufficiency 2025: Stroke [facility])     Comment:  unsure if TIA or stroke, no deficits No date: Wears hearing aid     Comment:  bilateral  Past Surgical History: 11/02/2015: CATARACT EXTRACTION W/PHACO; Right     Comment:  Procedure: CATARACT EXTRACTION PHACO AND INTRAOCULAR               LENS PLACEMENT (IOC);  Surgeon: Dene Etienne, MD;               Location: [facility];  Service: Ophthalmology;                 Laterality: Right; 07/18/2016: CATARACT EXTRACTION W/PHACO; Left     Comment:  Procedure: CATARACT EXTRACTION PHACO AND INTRAOCULAR               LENS PLACEMENT (IOC);  Surgeon: Dene Etienne, MD;               Location: [facility];  Service: Ophthalmology;                Laterality: Left; No date: COLONOSCOPY No date: FRACTURE SURGERY; Left     Comment:  hip 09/04/2021: HARDWARE REMOVAL; Left     Comment:  Procedure: HARDWARE REMOVAL;  Surgeon: Leora Lynwood SAUNDERS,               MD;  Location: [facility] OR[facility];  Service: Orthopedics;                Laterality: Left; No date: history of left hip fracture 02/12/2017: INTRAMEDULLARY (IM) NAIL INTERTROCHANTERIC; Left     Comment:  Procedure: INTRAMEDULLARY (IM) NAIL INTERTROCHANTRIC;                Surgeon: Cleotilde Barrio, MD;  Location: [facility];                Service: Orthopedics;  Laterality: Left; No date: kidney  stent 2015: OOPHORECTOMY; Bilateral 04/08/2018: ORIF WRIST FRACTURE; Right     Comment:  Procedure: OPEN REDUCTION INTERNAL FIXATION (ORIF) WRIST              FRACTURE;  Surgeon: Celena Sharper, MD;  Location: [facility] OR;              Service: Orthopedics;  Laterality: Right; No date: TONSILLECTOMY 09/04/2021: TOTAL HIP ARTHROPLASTY; Left     Comment:  Procedure: TOTAL HIP ARTHROPLASTY ANTERIOR APPROACH;                Surgeon: Leora Lynwood SAUNDERS, MD;  Location: [facility] ORS;                Service: Orthopedics;  Laterality: Left; 08/07/2017: TOTAL KNEE ARTHROPLASTY; Right     Comment:  Procedure: TOTAL KNEE ARTHROPLASTY;  Surgeon: Cleotilde Barrio, MD;  Location: [facility];  Service: Orthopedics;                Laterality: Right;  BMI    Body Mass Index: 21.41 kg/m      Reproductive/Obstetrics negative OB ROS                              Anesthesia Physical Anesthesia Plan  ASA: 3  Anesthesia Plan: General ETT   Post-op Pain Management:    Induction:  Intravenous  PONV Risk Score and Plan: Ondansetron , Dexamethasone , Midazolam  and Treatment may vary due to age or medical condition  Airway Management Planned: Oral ETT  Additional Equipment:   Intra-op Plan:   Post-operative Plan: Extubation in OR  Informed Consent: I have reviewed the patients History and Physical, chart, labs and discussed the procedure including the risks, benefits and alternatives for the proposed anesthesia with the patient or authorized representative who has indicated his/her understanding and acceptance.     Dental Advisory Given  Plan Discussed with: Anesthesiologist, CRNA and Surgeon  Anesthesia Plan Comments: (Patient consented for risks of anesthesia including but not limited to:  - adverse reactions to medications - damage to eyes, teeth, lips or other oral mucosa - nerve damage due to positioning  - sore throat or hoarseness - Damage to heart, brain, nerves, lungs, other parts of body or loss of life  Patient voiced understanding and assent.)        Anesthesia Quick Evaluation

## 2024-02-14 NOTE — ED Notes (Signed)
 OR staff called for report.

## 2024-02-14 NOTE — Anesthesia Procedure Notes (Signed)
 Procedure Name: Intubation Date/Time: 02/14/2024 6:11 PM  Performed by: Delores Evalene BROCKS, CRNAPre-anesthesia Checklist: Patient identified, Patient being monitored, Timeout performed, Emergency Drugs available and Suction available Patient Re-evaluated:Patient Re-evaluated prior to induction Oxygen Delivery Method: Circle system utilized Preoxygenation: Pre-oxygenation with 100% oxygen Induction Type: Rapid sequence and IV induction Laryngoscope Size: Mac and 3 Grade View: Grade I Tube type: Oral Tube size: 6.5 mm Number of attempts: 1 Airway Equipment and Method: Stylet Placement Confirmation: ETT inserted through vocal cords under direct vision, positive ETCO2 and breath sounds checked- equal and bilateral Secured at: 20 cm Tube secured with: Tape Dental Injury: Teeth and Oropharynx as per pre-operative assessment

## 2024-02-14 NOTE — ED Provider Notes (Signed)
 [facility] Provider Note    Event Date/Time   First MD Initiated Contact with Patient 02/14/24 1505     (approximate)   History   food bolus    HPI  Kellie Harris is a 89 y.o. female    with a past medical history of CVA, osteoarthritis, essential hypertension, hyperlipidemia, expressive aphasia,  who presents to the ED complaining of food bolus. According to the patient, she ate chicken last night with posterior feeling of food stuck.  Patient states having difficulty swallowing her saliva, she tried soup this morning with posterior vomiting.  Patient was able to take blood pressure medications.  Patient tried to drink ginger ale at home without resolution.  Patient states this is the first episode.  Patient denies difficulty breathing, stridor.  Patient is here with her son-in-law    Patient Active Problem List   Diagnosis Date Noted  . Hyponatremia 09/08/2021  . Acute blood loss anemia 09/08/2021  . Hypokalemia 09/07/2021  . History of total hip replacement, left 09/04/2021  . Dizziness 01/28/2021  . Aphasia 05/03/2020  . S/P ORIF (open reduction internal fixation) fracture radius and ulnar fx 04/08/18 04/10/2018  . Distal radius and ulna fracture, right   . History of CVA (cerebrovascular accident)   . Tachycardia   . Leukocytosis   . Seizures (HCC)   . Stroke-like episode (HCC) s/p IV tPA 04/03/2018  . Syncope   . TIA (transient ischemic attack) 11/04/2017  . Malnutrition of moderate degree 08/26/2017  . Total knee replacement status 08/07/2017  . Closed left hip fracture (HCC) 02/12/2017  . Essential hypertension 02/12/2017  . HLD (hyperlipidemia) 02/12/2017     ROS: Patient currently denies any vision changes, tinnitus, difficulty speaking, facial droop, sore throat, chest pain, shortness of breath, abdominal pain, nausea/vomiting/diarrhea, dysuria, or weakness/numbness/paresthesias in any extremity   Physical Exam   Triage Vital  Signs: ED Triage Vitals  Encounter Vitals Group     BP 02/14/24 1350 (!) 168/96     Girls Systolic BP Percentile --      Girls Diastolic BP Percentile --      Boys Systolic BP Percentile --      Boys Diastolic BP Percentile --      Pulse Rate 02/14/24 1350 90     Resp 02/14/24 1350 20     Temp 02/14/24 1350 98.1 F (36.7 C)     Temp Source 02/14/24 1350 Oral     SpO2 02/14/24 1350 100 %     Weight 02/14/24 1351 106 lb (48.1 kg)     Height 02/14/24 1351 4' 11 (1.499 m)     Head Circumference --      Peak Flow --      Pain Score 02/14/24 1348 5     Pain Loc --      Pain Education --      Exclude from Growth Chart --     Most recent vital signs: Vitals:   02/14/24 1350  BP: (!) 168/96  Pulse: 90  Resp: 20  Temp: 98.1 F (36.7 C)  SpO2: 100%     Physical Exam Vitals and nursing note reviewed.  Blood pressure at triage was elevated, saturations, respiratory rate within normal limits  Constitutional:      General: Awake and alert. No acute distress.    Appearance: Normal appearance. The patient is normal weight.      Able to speak in complete sentences without cough or dyspnea but with  difficulty swallowing her saliva.  During physical exam patient was spitting out her saliva HENT:     Head: Normocephalic and atraumatic.     Mouth: Mucous membranes mildly hydrated.  I did not see any any foreign body on her throat. Eyes:     General: PERRL. Normal EOMs          Conjunctiva/sclera: Conjunctivae normal.  Nose No congestion/rhinorrhea  CV:                  Good peripheral perfusion.  Regular rate and rhythm  Resp:               Normal effort.  Equal breath sounds bilaterally.  No wheezing, no stridor Abd:               Bowel sounds positive, no distention.  Soft, nontender.  No rebound or guarding.  Musculoskeletal:        General: No swelling. Normal range of motion.  Right lower extremity: Presence of old scar and resolution Skin:    General: Skin is warm and [place].      Capillary Refill: Capillary refill takes less than 2 seconds.     Findings: No rash.  Neurological:     Mental Status: The patient is awake and alert. MAE spontaneously. No gross focal neurologic deficits are appreciated.  Psychiatric Mood and affect are normal. Speech and behavior are normal.  ED Results / Procedures / Treatments   Labs (all labs ordered are listed, but only abnormal results are displayed) Labs Reviewed - No data to display   EKG     RADIOLOGY     PROCEDURES:  Critical Care performed:   Procedures   MEDICATIONS ORDERED IN ED: Medications - No data to display Clinical Course as of 02/14/24 1714  Fri Feb 14, 2024  1615 Reassessed the patient after giving carbonated soda.  Patient vomited it.  I will consult GI.  I want to try glucagon because of it side effect of increase vomiting.   [AE]  1658 Consulted Dr. Maryruth, after administering carbonated soda with posterior vomiting.  I did not order it glucagon due to the side effect of producing vomit.  Patient is going to have endoscopy for food bolus.  Updated patient and family.  [AE]    Clinical Course User Index [AE] Janit Kast, PA-C    IMPRESSION / MDM / ASSESSMENT AND PLAN / ED COURSE  I reviewed the triage vital signs and the nursing notes.  Differential diagnosis includes, but is not limited to, food bolus, foreign body, esophagitis, MI, pneumonia.   Patient's presentation is most consistent with acute complicated illness / injury requiring diagnostic workup.   Kellie Harris is a 89 y.o., female presents today with history of food bolus last night, after eating chicken, with posterior difficulty swallowing her own saliva, and vomiting  after drinking soda.  Physical exam, I did not visualize any foreign body on her throat, patient does not have any respiratory symptoms. I did give to the patient carbonated soda to to try to push down the food bolus.  Patient vomited the soda without  resolving the food bolus.  Consulted GI Dr. Maryruth.  Patient is going to have endoscopy today. Patient's diagnosis is consistent with food impaction of esophagus. I did not ordered x-ray because food is radiolucent .  I did not order lab work.  Physical exam is reassuring.  I did review the patient's allergies and medications. Discussed plan of  care with patient, answered all of patient's questions, Patient agreeable to plan of care Patient verbalized understanding.  Patient is here with her son-in-law.    FINAL CLINICAL IMPRESSION(S) / ED DIAGNOSES   Final diagnoses:  Food impaction of esophagus, initial encounter     Rx / DC Orders   ED Discharge Orders     None        Note:  This document was prepared using Dragon voice recognition software and may include unintentional dictation errors.   Janit Kast, PA-C 02/15/24 JOSHUA Dicky Anes, MD 02/15/24 1729

## 2024-02-17 ENCOUNTER — Encounter: Payer: Self-pay | Admitting: Gastroenterology

## 2024-02-18 ENCOUNTER — Observation Stay

## 2024-02-18 ENCOUNTER — Emergency Department

## 2024-02-18 ENCOUNTER — Observation Stay
Admission: EM | Admit: 2024-02-18 | Discharge: 2024-02-19 | Source: Ambulatory Visit | Attending: Family Medicine | Admitting: Family Medicine

## 2024-02-18 DIAGNOSIS — Z79899 Other long term (current) drug therapy: Secondary | ICD-10-CM | POA: Diagnosis not present

## 2024-02-18 DIAGNOSIS — Z8673 Personal history of transient ischemic attack (TIA), and cerebral infarction without residual deficits: Secondary | ICD-10-CM | POA: Insufficient documentation

## 2024-02-18 DIAGNOSIS — I6622 Occlusion and stenosis of left posterior cerebral artery: Secondary | ICD-10-CM | POA: Diagnosis not present

## 2024-02-18 DIAGNOSIS — I10 Essential (primary) hypertension: Secondary | ICD-10-CM | POA: Insufficient documentation

## 2024-02-18 DIAGNOSIS — R4701 Aphasia: Secondary | ICD-10-CM | POA: Diagnosis not present

## 2024-02-18 DIAGNOSIS — M1990 Unspecified osteoarthritis, unspecified site: Secondary | ICD-10-CM | POA: Insufficient documentation

## 2024-02-18 DIAGNOSIS — R4182 Altered mental status, unspecified: Secondary | ICD-10-CM | POA: Diagnosis not present

## 2024-02-18 DIAGNOSIS — R9089 Other abnormal findings on diagnostic imaging of central nervous system: Secondary | ICD-10-CM | POA: Diagnosis not present

## 2024-02-18 DIAGNOSIS — Z96651 Presence of right artificial knee joint: Secondary | ICD-10-CM | POA: Insufficient documentation

## 2024-02-18 DIAGNOSIS — G459 Transient cerebral ischemic attack, unspecified: Secondary | ICD-10-CM | POA: Insufficient documentation

## 2024-02-18 DIAGNOSIS — I6782 Cerebral ischemia: Secondary | ICD-10-CM | POA: Diagnosis not present

## 2024-02-18 DIAGNOSIS — E7800 Pure hypercholesterolemia, unspecified: Secondary | ICD-10-CM | POA: Diagnosis not present

## 2024-02-18 DIAGNOSIS — Z85828 Personal history of other malignant neoplasm of skin: Secondary | ICD-10-CM | POA: Insufficient documentation

## 2024-02-18 DIAGNOSIS — G319 Degenerative disease of nervous system, unspecified: Secondary | ICD-10-CM | POA: Diagnosis not present

## 2024-02-18 DIAGNOSIS — E785 Hyperlipidemia, unspecified: Secondary | ICD-10-CM | POA: Insufficient documentation

## 2024-02-18 DIAGNOSIS — M069 Rheumatoid arthritis, unspecified: Secondary | ICD-10-CM | POA: Insufficient documentation

## 2024-02-18 DIAGNOSIS — I672 Cerebral atherosclerosis: Secondary | ICD-10-CM | POA: Diagnosis not present

## 2024-02-18 DIAGNOSIS — Z7982 Long term (current) use of aspirin: Secondary | ICD-10-CM | POA: Insufficient documentation

## 2024-02-18 DIAGNOSIS — Z96642 Presence of left artificial hip joint: Secondary | ICD-10-CM | POA: Insufficient documentation

## 2024-02-18 DIAGNOSIS — R569 Unspecified convulsions: Secondary | ICD-10-CM

## 2024-02-18 DIAGNOSIS — R29818 Other symptoms and signs involving the nervous system: Secondary | ICD-10-CM | POA: Diagnosis not present

## 2024-02-18 DIAGNOSIS — R000 Tachycardia, unspecified: Secondary | ICD-10-CM | POA: Diagnosis not present

## 2024-02-18 DIAGNOSIS — D72829 Elevated white blood cell count, unspecified: Secondary | ICD-10-CM | POA: Insufficient documentation

## 2024-02-18 DIAGNOSIS — I700 Atherosclerosis of aorta: Secondary | ICD-10-CM | POA: Diagnosis not present

## 2024-02-18 DIAGNOSIS — R42 Dizziness and giddiness: Secondary | ICD-10-CM | POA: Diagnosis not present

## 2024-02-18 DIAGNOSIS — I6612 Occlusion and stenosis of left anterior cerebral artery: Secondary | ICD-10-CM | POA: Diagnosis not present

## 2024-02-18 DIAGNOSIS — I639 Cerebral infarction, unspecified: Secondary | ICD-10-CM | POA: Diagnosis not present

## 2024-02-18 DIAGNOSIS — F0390 Unspecified dementia without behavioral disturbance: Secondary | ICD-10-CM | POA: Diagnosis not present

## 2024-02-18 LAB — CBC WITH DIFFERENTIAL/PLATELET
Abs Immature Granulocytes: 0.07 K/uL (ref 0.00–0.07)
Basophils Absolute: 0.1 K/uL (ref 0.0–0.1)
Basophils Relative: 0 %
Eosinophils Absolute: 0 K/uL (ref 0.0–0.5)
Eosinophils Relative: 0 %
HCT: 42.6 % (ref 36.0–46.0)
Hemoglobin: 14.4 g/dL (ref 12.0–15.0)
Immature Granulocytes: 0 %
Lymphocytes Relative: 6 %
Lymphs Abs: 0.9 K/uL (ref 0.7–4.0)
MCH: 29.9 pg (ref 26.0–34.0)
MCHC: 33.8 g/dL (ref 30.0–36.0)
MCV: 88.6 fL (ref 80.0–100.0)
Monocytes Absolute: 0.8 K/uL (ref 0.1–1.0)
Monocytes Relative: 5 %
Neutro Abs: 14.1 K/uL — ABNORMAL HIGH (ref 1.7–7.7)
Neutrophils Relative %: 89 %
Platelets: 324 K/uL (ref 150–400)
RBC: 4.81 MIL/uL (ref 3.87–5.11)
RDW: 14.4 % (ref 11.5–15.5)
WBC: 16 K/uL — ABNORMAL HIGH (ref 4.0–10.5)
nRBC: 0 % (ref 0.0–0.2)

## 2024-02-18 LAB — CBC
HCT: 45.8 % (ref 36.0–46.0)
Hemoglobin: 14.9 g/dL (ref 12.0–15.0)
MCH: 29.7 pg (ref 26.0–34.0)
MCHC: 32.5 g/dL (ref 30.0–36.0)
MCV: 91.2 fL (ref 80.0–100.0)
Platelets: 329 K/uL (ref 150–400)
RBC: 5.02 MIL/uL (ref 3.87–5.11)
RDW: 14.6 % (ref 11.5–15.5)
WBC: 16.3 K/uL — ABNORMAL HIGH (ref 4.0–10.5)
nRBC: 0 % (ref 0.0–0.2)

## 2024-02-18 LAB — PROTIME-INR
INR: 1 (ref 0.8–1.2)
Prothrombin Time: 14.1 s (ref 11.4–15.2)

## 2024-02-18 LAB — COMPREHENSIVE METABOLIC PANEL WITH GFR
ALT: 13 U/L (ref 0–44)
AST: 26 U/L (ref 15–41)
Albumin: 5.1 g/dL — ABNORMAL HIGH (ref 3.5–5.0)
Alkaline Phosphatase: 68 U/L (ref 38–126)
Anion gap: 17 — ABNORMAL HIGH (ref 5–15)
BUN: 13 mg/dL (ref 8–23)
CO2: 20 mmol/L — ABNORMAL LOW (ref 22–32)
Calcium: 10.3 mg/dL (ref 8.9–10.3)
Chloride: 97 mmol/L — ABNORMAL LOW (ref 98–111)
Creatinine, Ser: 0.95 mg/dL (ref 0.44–1.00)
GFR, Estimated: 57 mL/min — ABNORMAL LOW (ref >60)
Glucose, Bld: 118 mg/dL — ABNORMAL HIGH (ref 70–99)
Potassium: 4.3 mmol/L (ref 3.5–5.1)
Sodium: 134 mmol/L — ABNORMAL LOW (ref 135–145)
Total Bilirubin: 1.4 mg/dL — ABNORMAL HIGH (ref 0.0–1.2)
Total Protein: 8.2 g/dL — ABNORMAL HIGH (ref 6.5–8.1)

## 2024-02-18 LAB — APTT: aPTT: 30 s (ref 24–36)

## 2024-02-18 LAB — CBG MONITORING, ED: Glucose-Capillary: 121 mg/dL — ABNORMAL HIGH (ref 70–99)

## 2024-02-18 MED ORDER — LEVETIRACETAM (KEPPRA) 500 MG/5 ML ADULT IV PUSH
1000.0000 mg | Freq: Once | INTRAVENOUS | Status: AC
  Administered 2024-02-18: 1000 mg via INTRAVENOUS
  Filled 2024-02-18: qty 10

## 2024-02-18 MED ORDER — IOHEXOL 350 MG/ML SOLN
75.0000 mL | Freq: Once | INTRAVENOUS | Status: AC | PRN
  Administered 2024-02-18: 75 mL via INTRAVENOUS

## 2024-02-18 MED ORDER — ATORVASTATIN CALCIUM 20 MG PO TABS
40.0000 mg | ORAL_TABLET | Freq: Every day | ORAL | Status: DC
  Administered 2024-02-18: 40 mg via ORAL
  Filled 2024-02-18: qty 2

## 2024-02-18 MED ORDER — ACETAMINOPHEN 650 MG RE SUPP: 650.0000 mg | RECTAL | Status: DC | PRN

## 2024-02-18 MED ORDER — LEVETIRACETAM 500 MG PO TABS: 500.0000 mg | ORAL_TABLET | Freq: Two times a day (BID) | ORAL | Status: DC

## 2024-02-18 MED ORDER — ACETAMINOPHEN 160 MG/5ML PO SOLN: 650.0000 mg | ORAL | Status: DC | PRN

## 2024-02-18 MED ORDER — ACETAMINOPHEN 325 MG PO TABS
650.0000 mg | ORAL_TABLET | ORAL | Status: DC | PRN
Start: 2024-02-18 — End: 2024-02-19
  Administered 2024-02-18: 650 mg via ORAL
  Filled 2024-02-18: qty 2

## 2024-02-18 MED ORDER — STROKE: EARLY STAGES OF RECOVERY BOOK
Freq: Once | Status: DC
Start: 2024-02-18 — End: 2024-02-19

## 2024-02-18 MED ORDER — LEVETIRACETAM 500 MG PO TABS
500.0000 mg | ORAL_TABLET | Freq: Two times a day (BID) | ORAL | Status: DC
  Administered 2024-02-19: 500 mg via ORAL
  Filled 2024-02-18 (×2): qty 1

## 2024-02-18 MED ORDER — SENNOSIDES-DOCUSATE SODIUM 8.6-50 MG PO TABS: 1.0000 | ORAL_TABLET | Freq: Every evening | ORAL | Status: DC | PRN

## 2024-02-18 NOTE — Consult Note (Signed)
 [facility] Telemedicine Consult  Requesting Provider: Dr. Malvina Consult Participants: Dr. RONAL Lav, RN-Adam Location of the provider: [facility] Location of the patient: [facility] ED bed 16  This consult was provided via telemedicine with 2-way video and audio communication. The patient/family was informed that care would be provided in this way and agreed to receive care in this manner.   Chief Complaint: Aphasia  HPI: 89 year old with past medical history of arthritis, DJD, previous episodes of expressive aphasia without acute stroke on workup for most part except for 1 time where there was a small right frontal infarct, with concern for episodes being TIAs versus seizures, presenting for evaluation of an episode of expressive aphasia that is persistent. She was at an orthopedic appointment and became somewhat dizzy and short of breath.  That was the reason for being brought to the ER but while in triage, family noted that she suddenly could not talk.  This episode is similar to her prior episodes but she has not had episode in the last 12 days.  The episode prior to that was in 2023 and the prior one in 2019 according to family but on chart review she has had possibly few more episodes. She had another such episode with admission to [facility] after surgery.  Daughter reports that her bills have been taken over by the family since she has more issues with reasoning rather than with memory and this has worsened over the past few years.  She uses a walker to walk.  She lives independent but requires help with ADLs from family.  Past Medical History:  Diagnosis Date   Arthritis    right knee, left shoulder/ OSTEO   Cancer (HCC)    skin    Degenerative disc disease, lumbar    L4-L5   Diverticulosis    ITIS   History of hiatal hernia    Hydronephrosis    right   Hypercholesteremia    Hypertension    CONTROLLED ON MEDS   IBS (irritable bowel syndrome)    Knee fracture, right 03/05/2015    DIFFICULTY WITH AMBULATION   Osteopenia    Ovarian cyst    Pancreatic insufficiency    Stroke [facility]) 2025   unsure if TIA or stroke, no deficits   Wears hearing aid    bilateral    No current facility-administered medications for this encounter.  Current Outpatient Medications:    acetaminophen  (TYLENOL ) 500 MG tablet, Take 1,000 mg by mouth every 8 (eight) hours as needed for mild pain or moderate pain., Disp: , Rfl:    amLODipine  (NORVASC ) 5 MG tablet, Take 5 mg by mouth daily., Disp: , Rfl:    aspirin  81 MG chewable tablet, Chew 1 tablet (81 mg total) by mouth 2 (two) times daily., Disp: 60 tablet, Rfl: 0   atorvastatin  (LIPITOR) 20 MG tablet, Take 20 mg by mouth daily., Disp: , Rfl:    celecoxib  (CELEBREX ) 100 MG capsule, Take 100 mg by mouth daily., Disp: , Rfl:    Cholecalciferol  (VITAMIN D ) 50 MCG (2000 UT) tablet, Take 2,000 Units by mouth daily., Disp: , Rfl:    ferrous sulfate  325 (65 FE) MG tablet, Take 1 tablet (325 mg total) by mouth daily., Disp: 30 tablet, Rfl: 0   latanoprost  (XALATAN ) 0.005 % ophthalmic solution, SMARTSIG:In Eye(s), Disp: , Rfl:    lisinopril  (ZESTRIL ) 20 MG tablet, Take 1 tablet by mouth 2 (two) times daily. (Patient not taking: Reported on 06/01/2022), Disp: , Rfl:    losartan  (  COZAAR ) 25 MG tablet, Take 25 mg by mouth daily., Disp: , Rfl:    metoprolol  succinate (TOPROL -XL) 25 MG 24 hr tablet, Take 12.5 mg by mouth daily., Disp: , Rfl:    pantoprazole  (PROTONIX ) 20 MG tablet, Take 1 tablet (20 mg total) by mouth daily., Disp: 30 tablet, Rfl: 1   pravastatin  (PRAVACHOL ) 40 MG tablet, Take 40 mg by mouth at bedtime. (Patient not taking: Reported on 06/01/2022), Disp: , Rfl:    tamsulosin  (FLOMAX ) 0.4 MG CAPS capsule, Take 1 capsule (0.4 mg total) by mouth daily after supper. (Patient not taking: Reported on 06/01/2022), Disp: 30 capsule, Rfl:    vitamin B-12 1000 MCG tablet, Take 1 tablet (1,000 mcg total) by mouth daily., Disp: 30 tablet, Rfl:  0    LKW: 1715  IV thrombolysis given?: No, stereotypic spells, low suspicion for stroke, other than speech deficits that are stereotypic, no other focal deficits IR Thrombectomy? No, no ELVO mRS - 3 Time of teleneurologist evaluation: 1746 on phone with RN gathering history. Technical difficlty with camera connection and finally on camera 1752 hrs  Exam: Vitals:   02/18/24 1539 02/18/24 1544  BP: (!) 161/92   Pulse: (!) 107 94  Resp: 18 18  Temp: 98.3 F (36.8 C) 98.2 F (36.8 C)  SpO2: 96%     General: Awake alert somewhat short of breath Neurologic exam She is awake alert tracking the RN at bedside. She is able to say simple words when passively moving her arms like it hurts but does not name objects.  She is following simple commands inconsistently.  She would not tell me her name or age or where she is at at this time. Cranial nerves II to XII appear grossly intact Motor examination reveals symmetric weakness where she drifts both her upper and lower extremities-exam is limited by pain due to arthritis in all 4 extremities Sensations appear intact   NIHSS 1A: Level of Consciousness - 0 1B: Ask Month and Age - 2 1C: 'Blink Eyes' & 'Squeeze Hands' - 1 2: Test Horizontal Extraocular Movements - 0 3: Test Visual Fields - 0 4: Test Facial Palsy - 0 5A: Test Left Arm Motor Drift - 1 5B: Test Right Arm Motor Drift - 1 6A: Test Left Leg Motor Drift - 1 6B: Test Right Leg Motor Drift - 1 7: Test Limb Ataxia - 0 8: Test Sensation - 0 9: Test Language/Aphasia- 2 10: Test Dysarthria - 0 11: Test Extinction/Inattention - 0 NIHSS score: 9   Imaging Reviewed: CT head-no acute changes.  CT angio head and neck no ELVO review.  Formal radiology review pending  Labs reviewed in epic and pertinent values follow: CBC    Component Value Date/Time   WBC 16.0 (H) 02/18/2024 1756   RBC 4.81 02/18/2024 1756   HGB 14.4 02/18/2024 1756   HGB 12.5 07/10/2014 0530   HCT 42.6  02/18/2024 1756   HCT 37.5 07/10/2014 0530   PLT 324 02/18/2024 1756   PLT 243 07/10/2014 0530   MCV 88.6 02/18/2024 1756   MCV 90 07/10/2014 0530   MCH 29.9 02/18/2024 1756   MCHC 33.8 02/18/2024 1756   RDW 14.4 02/18/2024 1756   RDW 13.7 07/10/2014 0530   LYMPHSABS 0.9 02/18/2024 1756   LYMPHSABS 1.7 07/10/2014 0530   MONOABS 0.8 02/18/2024 1756   MONOABS 0.7 07/10/2014 0530   EOSABS 0.0 02/18/2024 1756   EOSABS 0.5 07/10/2014 0530   BASOSABS 0.1 02/18/2024 1756   BASOSABS 0.1  07/10/2014 0530   CMP     Component Value Date/Time   NA 134 (L) 02/18/2024 1546   NA 138 07/10/2014 0530   K 4.3 02/18/2024 1546   K 3.8 07/10/2014 0530   CL 97 (L) 02/18/2024 1546   CL 105 07/10/2014 0530   CO2 20 (L) 02/18/2024 1546   CO2 26 07/10/2014 0530   GLUCOSE 118 (H) 02/18/2024 1546   GLUCOSE 97 07/10/2014 0530   BUN 13 02/18/2024 1546   BUN 13 07/10/2014 0530   CREATININE 0.95 02/18/2024 1546   CREATININE 0.96 07/10/2014 0530   CALCIUM  10.3 02/18/2024 1546   CALCIUM  10.1 07/10/2014 0530   PROT 8.2 (H) 02/18/2024 1546   PROT 7.7 07/08/2014 2102   ALBUMIN 5.1 (H) 02/18/2024 1546   ALBUMIN 4.2 07/08/2014 2102   AST 26 02/18/2024 1546   AST 20 07/08/2014 2102   ALT 13 02/18/2024 1546   ALT 24 07/08/2014 2102   ALKPHOS 68 02/18/2024 1546   ALKPHOS 90 07/08/2014 2102   BILITOT 1.4 (H) 02/18/2024 1546   BILITOT 0.3 07/08/2014 2102   GFRNONAA 57 (L) 02/18/2024 1546   GFRNONAA 59 (L) 07/10/2014 0530   GFRNONAA >60 07/02/2013 2228   GFRAA >60 05/04/2020 0424   GFRAA >60 07/10/2014 0530   GFRAA >60 07/02/2013 2228     Assessment:  89 year old woman with past medical history of similar stereotypic episodes of aphasia in the past many years concern for TIA versus seizures presenting from orthopedic doctors office for shortness of breath and dizziness and then suddenly while in triage, started having difficulty with speech-has more expressive and less of receptive aphasia.  She has no  other focal findings. Given the stereotypic nature of her episodes, this is less likely to be a stroke.  Seizure versus hypoperfusion might be higher in the differential. Prior episodes of speech arrest/aphasia resolved within a few hours to up to 36 hours at 1 point. Prior imaging that I have access to does not appear overtly concerning for CAA for these to be amyloid spells but repeat imaging and EEG would be helpful.  I will repeat the stroke/TIA risk factor workup given her age and risk factors.  Her blood pressures are in the 160 ranges.  In the past, she had come in more hypertensive with concern for hypertensive urgency as well. Also of note, she has leukocytosis-infectious workup would also be helpful.  IMPRESSION: Broad differential for the aphasia vs speech arrest - etiology TIA vs Seizures. No recent MRI but no significant chronic microhemorrhages on old scan from 2023   Recommendations:  Admit to hospitalist  For speech arrest-concern for seizures: - Load with Keppra  1 g IV x 1. - Start Keppra  500 twice daily - EEG - Seizure precautions - Check UA and chest x-ray for any infection underlying that might have lowered her seizure threshold. -Management of symptomatic dizziness and shortness of breath per primary team.  For concern for TIA/stroke, risk factor workup and management to include: - Telemetry - Frequent neurochecks -MRI brain without contrast -2D echo, A1c, lipid panel - Therapy assessments - Continue aspirin  81 - Statin for LDL goal less than 70.   Plan discussed with Dr. Malvina.  Plan also discussed with daughter at bedside.  -- Eligio Lav, MD Neurologist [facility] Pager: [phone]

## 2024-02-18 NOTE — ED Notes (Signed)
Code  stroke  called  to  [facility] 

## 2024-02-18 NOTE — H&P (Signed)
 History and Physical   Kellie Harris FMW:[id number] DOB: [birth year] DOA: 02/18/2024  PCP: [name], MD  Outpatient Specialists: Dr. Lane, [facility] neurology Patient coming from: [facility] clinic  I have personally briefly reviewed patient's old medical records in [facility] EMR.  Chief Concern: hypoxia at [facility] orthopedic clinic  HPI: Kellie Harris is a 89 year old female with history degenerative joint disease, arthritis, hypertension, hypercholesteremia, who presents for chief concern of acute hypoxia at outpatient [facility] orthopedics clinic.  Vitals in the ED, showed T 98.2, rr 18, HR 94, BP 161/92, spO2 is 96% on RA.   Serum sodium 134, K 4.3, chloride 97, bicarb 20, BUN 13, sCr 0.95, eGFR 57, nonfasting blood glucose 118, wbc 18, hgb 14.4, platelets 324.  AST/ALT are within normal limits. T bili 1.4.  ED treatment: none at this time ---------------------------- At bedside, patient is severely hard of hearing without hearing aid.  Daughter, Bari, at bedside states that patient at baseline lives on her own, does her own ADLs, would be able to tell me her own name, age, current location, and know her daughter.   Daughter notes that patient mentation likely changed around 3p today.   Social history: She lives on her own. Daughter denies tobacco, etoh, and recreational drug use. She is retired and formerly was a [name] fundraiser.  ROS: unable to complete due to acuity of patient presentation.  ED Course: Discussed with EDP, patient requiring hospitalization for chief concerns of altered mentation, concerning for stroke versus seizure.  Assessment/Plan  Principal Problem:   Expressive aphasia Active Problems:   Essential hypertension   HLD (hyperlipidemia)   TIA (transient ischemic attack)   History of CVA (cerebrovascular accident)   Leukocytosis   Altered mental status   Assessment and Plan:  * Expressive aphasia Neurology has been consulted and we appreciate further  recommendations Complete echo MRI of the brain Fasting lipid and A1c ordered Frequent neuro vascular checks N.p.o. pending swallow study PT, OT, SLP Fall precaution   Altered mental status Per neurology specialist, keppra  1000 mg IV once ordered Keppra  500 mg BID initiated on admission Seizure precautions  Leukocytosis With reported acute hypoxia to 84% on RA at [facility] orthopedic clinic UA ordered and pending collection Port CXR stat ordered  HLD (hyperlipidemia) Atorvastatin  40 mg at bedtime daily  Chart reviewed.   DVT prophylaxis: ted hose  Code Status: dnr/dni per daughter Bari at bedside, who states she has HPOA  Diet: npo pending swallow screening Family Communication: updated and discussed with daughter, Bari at bedside  Disposition Plan: pending clinical course Consults called: neurology  Admission status: telemetry medical observation   Past Medical History:  Diagnosis Date   Arthritis    right knee, left shoulder/ OSTEO   Cancer (HCC)    skin    Degenerative disc disease, lumbar    L4-L5   Diverticulosis    ITIS   History of hiatal hernia    Hydronephrosis    right   Hypercholesteremia    Hypertension    CONTROLLED ON MEDS   IBS (irritable bowel syndrome)    Knee fracture, right 03/05/2015   DIFFICULTY WITH AMBULATION   Osteopenia    Ovarian cyst    Pancreatic insufficiency    Stroke [facility]) 2025   unsure if TIA or stroke, no deficits   Wears hearing aid    bilateral   Past Surgical History:  Procedure Laterality Date   CATARACT EXTRACTION W/PHACO Right 11/02/2015   Procedure: CATARACT EXTRACTION  PHACO AND INTRAOCULAR LENS PLACEMENT (IOC);  Surgeon: Dene Etienne, MD;  Location: [facility];  Service: Ophthalmology;  Laterality: Right;   CATARACT EXTRACTION W/PHACO Left 07/18/2016   Procedure: CATARACT EXTRACTION PHACO AND INTRAOCULAR LENS PLACEMENT (IOC);  Surgeon: Dene Etienne, MD;  Location: [facility];  Service:  Ophthalmology;  Laterality: Left;   COLONOSCOPY     ESOPHAGOGASTRODUODENOSCOPY N/A 02/14/2024   Procedure: EGD (ESOPHAGOGASTRODUODENOSCOPY);  Surgeon: Maryruth Ole DASEN, MD;  Location: [facility] END[facility];  Service: Endoscopy;  Laterality: N/A;   FRACTURE SURGERY Left    hip   HARDWARE REMOVAL Left 09/04/2021   Procedure: HARDWARE REMOVAL;  Surgeon: Leora Lynwood SAUNDERS, MD;  Location: [facility] ORS;  Service: Orthopedics;  Laterality: Left;   history of left hip fracture     INTRAMEDULLARY (IM) NAIL INTERTROCHANTERIC Left 02/12/2017   Procedure: INTRAMEDULLARY (IM) NAIL INTERTROCHANTRIC;  Surgeon: Cleotilde Barrio, MD;  Location: [facility] ORS;  Service: Orthopedics;  Laterality: Left;   kidney stent     OOPHORECTOMY Bilateral 2015   ORIF WRIST FRACTURE Right 04/08/2018   Procedure: OPEN REDUCTION INTERNAL FIXATION (ORIF) WRIST FRACTURE;  Surgeon: Celena Sharper, MD;  Location: [facility] OR;  Service: Orthopedics;  Laterality: Right;   TONSILLECTOMY     TOTAL HIP ARTHROPLASTY Left 09/04/2021   Procedure: TOTAL HIP ARTHROPLASTY ANTERIOR APPROACH;  Surgeon: Leora Lynwood SAUNDERS, MD;  Location: [facility] ORS;  Service: Orthopedics;  Laterality: Left;   TOTAL KNEE ARTHROPLASTY Right 08/07/2017   Procedure: TOTAL KNEE ARTHROPLASTY;  Surgeon: Cleotilde Barrio, MD;  Location: [facility] ORS;  Service: Orthopedics;  Laterality: Right;   Social History:  reports that she has never smoked. She has never used smokeless tobacco. She reports that she does not drink alcohol and does not use drugs.  No Known Allergies Family History  Problem Relation Age of Onset   Breast cancer Mother 71   Leukemia Mother    Stroke Father    Family history: Family history reviewed and not pertinent.  Prior to Admission medications   Medication Sig Start Date End Date Taking? Authorizing Provider  amLODipine  (NORVASC ) 10 MG tablet Take 10 mg by mouth daily. 11/06/23  Yes [provider]  aspirin  81 MG chewable tablet Chew 1 tablet (81 mg total) by mouth 2  (two) times daily. 09/06/21  Yes Joshua Lin, PA-C  atorvastatin  (LIPITOR) 40 MG tablet Take 40 mg by mouth.  Take 1 tablet (40 mg total) by mouth once daily 02/13/24  Yes [provider]  losartan  (COZAAR ) 25 MG tablet Take 25 mg by mouth daily.   Yes [provider]  metoprolol  succinate (TOPROL -XL) 25 MG 24 hr tablet Take 12.5 mg by mouth daily. 03/02/21  Yes [provider]  pantoprazole  (PROTONIX ) 20 MG tablet Take 1 tablet (20 mg total) by mouth daily. 02/14/24 02/13/25 Yes Locklear, Ole DASEN, MD  timolol  (TIMOPTIC ) 0.5 % ophthalmic solution Apply 1 drop to eye.  Place 1 drop into both eyes once daily 06/10/23  Yes [provider]  vitamin B-12 1000 MCG tablet Take 1 tablet (1,000 mcg total) by mouth daily. 09/10/21  Yes Laurita Pillion, MD  acetaminophen  (TYLENOL ) 500 MG tablet Take 1,000 mg by mouth every 8 (eight) hours as needed for mild pain or moderate pain.    [provider]  celecoxib  (CELEBREX ) 100 MG capsule Take 100 mg by mouth daily. Patient not taking: Reported on 02/18/2024 04/13/22   [provider]  Cholecalciferol  (VITAMIN D ) 50 MCG (2000 UT) tablet Take 2,000 Units by  mouth daily.    [provider]  ferrous sulfate  325 (65 FE) MG tablet Take 1 tablet (325 mg total) by mouth daily. 09/10/21 10/10/21  Laurita Pillion, MD  latanoprost  (XALATAN ) 0.005 % ophthalmic solution SMARTSIG:In Eye(s) 05/21/22   [provider]  lisinopril  (ZESTRIL ) 20 MG tablet Take 1 tablet by mouth 2 (two) times daily. Patient not taking: Reported on 06/01/2022 05/02/21   [provider]  pravastatin  (PRAVACHOL ) 40 MG tablet Take 40 mg by mouth at bedtime. Patient not taking: Reported on 06/01/2022 06/05/21   [provider]  tamsulosin  (FLOMAX ) 0.4 MG CAPS capsule Take 1 capsule (0.4 mg total) by mouth daily after supper. Patient not taking: Reported on 06/01/2022 09/10/21   Laurita Pillion, MD   Physical Exam: Vitals:   02/18/24  1730 02/18/24 1800 02/18/24 1830 02/18/24 1900  BP: (!) 162/86   (!) 155/101  Pulse: 83 99 93 98  Resp: 20 (!) 23 20 (!) 23  Temp:      TempSrc:      SpO2: 96% 98% 99% 97%  Weight:      Height:       Constitutional: appears age appropriate, NAD, calm Eyes: PERRL, lids and conjunctivae normal ENMT: Mucous membranes are moist. Posterior pharynx clear of any exudate or lesions. Age-appropriate dentition. Hearing appropriate Neck: normal, supple, no masses, no thyromegaly Respiratory: clear to auscultation bilaterally, no wheezing, no crackles. Normal respiratory effort. No accessory muscle use.  Cardiovascular: Regular rate and rhythm, no murmurs / rubs / gallops. B/L lower extremity trace edema. 2+ pedal pulses. No carotid bruits.  Abdomen: no tenderness, no masses palpated, no hepatosplenomegaly. Bowel sounds positive.  Musculoskeletal: no clubbing / cyanosis. No joint deformity upper and lower extremities. Good ROM, no contractures, no atrophy. Normal muscle tone.  Skin: no rashes, lesions, ulcers. No induration Neurologic: Sensation intact. Strength 5/5 in all 4.  Psychiatric: lacks judgment and insight. Not alert and oriented. Awake. Normal mood.   EKG: independently reviewed, showing sinus tachycardia with rate of 101, qtc 495, RBBB.  Chest x-ray on Admission: I personally reviewed and I agree with radiologist reading as below.  CT ANGIO HEAD NECK W WO CM (CODE STROKE) Result Date: 02/18/2024 CLINICAL DATA:  Provided history: Stroke, follow-up. EXAM: CT ANGIOGRAPHY HEAD AND NECK WITH AND WITHOUT CONTRAST TECHNIQUE: Multidetector CT imaging of the head and neck was performed using the standard protocol during bolus administration of intravenous contrast. Multiplanar CT image reconstructions and MIPs were obtained to evaluate the vascular anatomy. Carotid stenosis measurements (when applicable) are obtained utilizing NASCET criteria, using the distal internal carotid diameter as the  denominator. RADIATION DOSE REDUCTION: This exam was performed according to the departmental dose-optimization program which includes automated exposure control, adjustment of the mA and/or kV according to patient size and/or use of iterative reconstruction technique. CONTRAST:  75mL OMNIPAQUE  IOHEXOL  350 MG/ML SOLN COMPARISON:  Noncontrast head CT performed earlier today 02/18/2024. CT angiogram head/neck 04/03/2018. FINDINGS: CTA NECK FINDINGS Aortic arch: Standard aortic branching. Atherosclerotic plaque within the visible thoracic aorta and proximal major branch vessels of the neck. Atherosclerotic plaque within the visible thoracic aorta and proximal major branch vessels of the neck. Streak/beam hardening artifact arising from a dense contrast bolus partially obscures the right subclavian artery. Within this limitation, there is no appreciable hemodynamically significant innominate or proximal subclavian artery stenosis. Right carotid system: CCA and ICA patent within the neck without stenosis. Mild atherosclerotic plaque about the carotid bifurcation. Left carotid system: CCA and ICA  patent within the neck without measurable stenosis. Nonstenotic atherosclerotic plaque at the CCA origin, about the carotid bifurcation and within the proximal ICA. Vertebral arteries: Patent within the neck. The right vertebral artery is slightly dominant. Nonstenotic calcified plaque at the right vertebral artery origin and within the right V3 segment. Nonstenotic calcified plaque within the proximal left V3 segment. Atherosclerotic plaque within the left vertebral artery at the V3/V4 junction which has progressed and now results in at least moderate stenosis. Skeleton: Nonspecific reversal of the expected cervical doses. Grade 1 anterolisthesis at C2-C3. Cervical spondylosis and multilevel vertebral ankylosis. No acute fracture or aggressive osseous lesion. Other neck: No neck mass or cervical lymphadenopathy. Upper chest: No  consolidation within the imaged lung apices. Review of the MIP images confirms the above findings CTA HEAD FINDINGS Anterior circulation: The intracranial internal carotid arteries are patent. Nonstenotic calcified plaque within both vessels The M1 middle cerebral arteries are patent. No M2 proximal branch occlusion or high-grade proximal stenosis. The anterior cerebral arteries are patent. New sites of up to moderate stenosis within the left anterior cerebral artery A4 segment. Unchanged 3 mm ventrally projecting vascular protrusion arising from the right middle cerebral artery distal M1 segment, which may reflect an aneurysm or infundibulum. Posterior circulation: The intracranial vertebral arteries are patent. Nonstenotic atherosclerotic plaque within the intracranial right vertebral atherosclerotic plaque within the intracranial right vertebral artery with no more than mild stenosis. Progressive atherosclerotic plaque within the left vertebral artery at the V3/V4 junction, now with at least moderate stenosis. The basilar artery is patent. The posterior cerebral arteries are patent. Moderate stenosis within the right PCA P2 segment, new from the prior exam. Moderate to severe stenosis within the left PCA at the P2/P3 junction, also new from the prior exam. Posterior communicating arteries are diminutive or absent, bilaterally. Venous sinuses: Contrast timing precludes evaluation for dural venous sinus thrombosis. Anatomic variants: As described. Review of the MIP images confirms the above findings No emergent large vessel occlusion identified. These results were communicated to Dr. Voncile at 7:34 pmon 7/8/2025by text page via the [facility] messaging system. IMPRESSION: CTA neck: 1. The common carotid and internal carotid arteries are patent within the neck without stenosis. Mild atherosclerotic plaque bilaterally, as described. 2. The vertebral arteries are patent within the neck. Atherosclerotic plaque within the  left vertebral artery at the V3/V4 junction, progressed from the prior CTA of 04/03/2018 and now resulting in at least moderate stenosis. Non-stenotic atherosclerotic plaque elsewhere within bilateral cervical vertebral arteries. 3. Aortic Atherosclerosis (ICD10-I70.0). CTA head: 1. No proximal intracranial large vessel occlusion identified. 2. Intracranial atherosclerotic disease with multifocal stenoses, most notably as follows. 3. Atherosclerotic plaque within the left vertebral artery at the V3/V4 junction, progressed from the prior CTA of 04/03/2018 and now resulting in at least moderate stenosis. 4. New moderate-to-severe stenosis within the left posterior cerebral artery at the P3/P4 junction. 5. New moderate stenosis within the right PCA P2 segment. 6. New sites of up to moderate stenosis within the left anterior cerebral artery A4 segment. 7. Unchanged 3 mm vascular protrusion arising from the right middle cerebral artery distal M1 segment, which may reflect an aneurysm or infundibulum. Electronically Signed   By: Rockey Childs D.O.   On: 02/18/2024 19:34   CT HEAD CODE STROKE WO CONTRAST Result Date: 02/18/2024 CLINICAL DATA:  Code stroke. Neuro deficit, acute, stroke suspected. Shortness of breath. Dizziness. EXAM: CT HEAD WITHOUT CONTRAST TECHNIQUE: Contiguous axial images were obtained from the base of the skull  through the vertex without intravenous contrast. RADIATION DOSE REDUCTION: This exam was performed according to the departmental dose-optimization program which includes automated exposure control, adjustment of the mA and/or kV according to patient size and/or use of iterative reconstruction technique. COMPARISON:  Head CT 02/06/2024. Brain MRI 06/01/2022. FINDINGS: Brain: Generalized cerebral atrophy. Known chronic infarcts within the right corona radiata, thalami, left occipital lobe and bilateral cerebellar hemispheres, some of which were better appreciated on the prior brain MRI of  06/01/2022. Background advanced patchy and ill-defined hypoattenuation within the cerebral white matter, nonspecific but compatible chronic small vessel fall ischemic disease. There is no acute intracranial hemorrhage. No acute demarcated cortical infarct. No extra-axial fluid collection. No evidence of an intracranial mass. No midline shift. Vascular: No hyperdense vessel. Atherosclerotic calcifications. Skull: No calvarial fracture or aggressive osseous lesion. Sinuses/Orbits: No mass or acute finding within the imaged orbits. No significant paranasal sinus disease at the imaged levels. ASPECTS [facility] Stroke Program Early CT Score) - Ganglionic level infarction (caudate, lentiform nuclei, internal capsule, insula, M1-M3 cortex): 7 - Supraganglionic infarction (M4-M6 cortex): 3 Total score (0-10 with 10 being normal): 10 (when discounting chronic infarcts). No evidence of an acute intracranial abnormality. These results were called by telephone at the time of interpretation on 02/18/2024 at 6:30 pm to provider DAVID [facility] , who verbally acknowledged these results. IMPRESSION: 1. No evidence of an acute intracranial abnormality. 2. Parenchymal atrophy, chronic small vessel ischemic disease and unchanged chronic infarcts, as described. Electronically Signed   By: Rockey Childs D.O.   On: 02/18/2024 18:31   Labs on Admission: I have personally reviewed following labs  CBC: Recent Labs  Lab 02/18/24 1546 02/18/24 1756  WBC 16.3* 16.0*  NEUTROABS  --  14.1*  HGB 14.9 14.4  HCT 45.8 42.6  MCV 91.2 88.6  PLT 329 324   Basic Metabolic Panel: Recent Labs  Lab 02/18/24 1546  NA 134*  K 4.3  CL 97*  CO2 20*  GLUCOSE 118*  BUN 13  CREATININE 0.95  CALCIUM  10.3   GFR: Estimated Creatinine Clearance: 26.8 mL/min (by C-G formula based on SCr of 0.95 mg/dL).  Liver Function Tests: Recent Labs  Lab 02/18/24 1546  AST 26  ALT 13  ALKPHOS 68  BILITOT 1.4*  PROT 8.2*  ALBUMIN 5.1*    Coagulation Profile: Recent Labs  Lab 02/18/24 1756  INR 1.0   CBG: Recent Labs  Lab 02/18/24 1743  GLUCAP 121*   Urine analysis:    Component Value Date/Time   COLORURINE YELLOW 09/07/2021 1243   APPEARANCEUR CLEAR 09/07/2021 1243   APPEARANCEUR Clear 07/08/2014 2155   LABSPEC 1.020 09/07/2021 1243   LABSPEC 1.003 07/08/2014 2155   PHURINE 7.0 09/07/2021 1243   GLUCOSEU NEGATIVE 09/07/2021 1243   GLUCOSEU Negative 07/08/2014 2155   HGBUR TRACE (A) 09/07/2021 1243   BILIRUBINUR NEGATIVE 09/07/2021 1243   BILIRUBINUR Negative 07/08/2014 2155   KETONESUR 15 (A) 09/07/2021 1243   PROTEINUR 100 (A) 09/07/2021 1243   NITRITE NEGATIVE 09/07/2021 1243   LEUKOCYTESUR NEGATIVE 09/07/2021 1243   LEUKOCYTESUR Negative 07/08/2014 2155   This document was prepared using Dragon Voice Recognition software and may include unintentional dictation errors.  Dr. Sherre [facility]  If 7PM-7AM, please contact overnight-coverage provider If 7AM-7PM, please contact day attending provider [email or web address]  02/18/2024, 8:24 PM

## 2024-02-18 NOTE — Assessment & Plan Note (Addendum)
 Per neurology specialist, keppra  1000 mg IV once ordered Keppra  500 mg BID initiated on admission Seizure precautions

## 2024-02-18 NOTE — ED Notes (Signed)
 CCMD called at this time to place pt on the cardiac monitor.

## 2024-02-18 NOTE — Assessment & Plan Note (Addendum)
 With reported acute hypoxia to 84% on RA at [facility] orthopedic clinic UA ordered and pending collection Port CXR stat ordered

## 2024-02-18 NOTE — ED Provider Notes (Signed)
 [facility] Provider Note    Event Date/Time   First MD Initiated Contact with Patient 02/18/24 1714     (approximate)   History   Dizziness and Code Stroke   HPI Kellie Harris is a 89 y.o. female with history of CVA, HTN, HLD presenting originally for lightheadedness.  Patient was reportedly going to her orthopedic clinic visit today and while there had an episode of feeling lightheaded.  They checked her pulse ox and thought her oxygen might have been low and told her to come to the ED.  She was still talking on arrival with ED triage with only symptom being lightheadedness.  After patient was roomed, I spoke with her and daughter where she seemed to have speech difficulties.  Daughter reported she was fine on arrival but approximately 20 minutes before I got in the room started having expressive aphasia.  She reports episodes of this in the past for which she has had admissions and stroke workup.  Also thought to potentially be seizure related.  Stroke alert immediately called at this time.  She is otherwise not noted any cough, congestion, shortness of breath, chest pain, abdominal pain, nausea, vomiting before any of symptom onset today at the clinic.  Chart review: Reviewed prior ED visits as well as hospitalizations regarding expressive aphasia and speech changes     Physical Exam   Triage Vital Signs: ED Triage Vitals  Encounter Vitals Group     BP 02/18/24 1539 (!) 161/92     Girls Systolic BP Percentile --      Girls Diastolic BP Percentile --      Boys Systolic BP Percentile --      Boys Diastolic BP Percentile --      Pulse Rate 02/18/24 1539 (!) 107     Resp 02/18/24 1539 18     Temp 02/18/24 1539 98.3 F (36.8 C)     Temp Source 02/18/24 1539 Oral     SpO2 02/18/24 1539 96 %     Weight 02/18/24 1544 105 lb 13.1 oz (48 kg)     Height 02/18/24 1544 4' 11 (1.499 m)     Head Circumference --      Peak Flow --      Pain Score 02/18/24 1544 0      Pain Loc --      Pain Education --      Exclude from Growth Chart --     Most recent vital signs: Vitals:   02/18/24 1800 02/18/24 1830  BP:    Pulse: 99 93  Resp: (!) 23 20  Temp:    SpO2: 98% 99%   I have reviewed the vital signs. General:  Awake, alert, difficulty getting words out Head:  Normocephalic, Atraumatic. EENT:  PERRL, EOMI, Oral mucosa pink and moist, Neck is supple. Cardiovascular: Regular rate, 2+ distal pulses. Respiratory:  Normal respiratory effort, symmetrical expansion, no distress.   Extremities:  Moving all four extremities through full ROM without pain.   Neuro:  Alert but unable to consistently speak.  Has active expressive aphasia.  No facial droop noted.  She is following commands and moves all extremities but difficult to fully interpret strength and unable to interpret sensation. Skin:  Warm, Seipp, no rash.   Psych: Appropriate affect.    ED Results / Procedures / Treatments   Labs (all labs ordered are listed, but only abnormal results are displayed) Labs Reviewed  COMPREHENSIVE METABOLIC PANEL WITH GFR -  Abnormal; Notable for the following components:      Result Value   Sodium 134 (*)    Chloride 97 (*)    CO2 20 (*)    Glucose, Bld 118 (*)    Total Protein 8.2 (*)    Albumin 5.1 (*)    Total Bilirubin 1.4 (*)    GFR, Estimated 57 (*)    Anion gap 17 (*)    All other components within normal limits  CBC - Abnormal; Notable for the following components:   WBC 16.3 (*)    All other components within normal limits  CBC WITH DIFFERENTIAL/PLATELET - Abnormal; Notable for the following components:   WBC 16.0 (*)    Neutro Abs 14.1 (*)    All other components within normal limits  CBG MONITORING, ED - Abnormal; Notable for the following components:   Glucose-Capillary 121 (*)    All other components within normal limits  PROTIME-INR  APTT  URINALYSIS, ROUTINE W REFLEX MICROSCOPIC  DIFFERENTIAL  URINE DRUG SCREEN, QUALITATIVE ([facility]  ONLY)  ETHANOL     EKG My EKG interpretation: Rate of 101, sinus tachycardia, right bundle branch block.  No acute ST elevations or depressions   RADIOLOGY Independent interpreted CT head with no acute pathology such as intracranial bleed   PROCEDURES:  Critical Care performed: Yes, see critical care procedure note(s)  .Critical Care  Performed by: Malvina Alm DASEN, MD Authorized by: Malvina Alm DASEN, MD   Critical care provider statement:    Critical care time (minutes):  35   Critical care was necessary to treat or prevent imminent or life-threatening deterioration of the following conditions: Expressive aphasia, CVA.   Critical care was time spent personally by me on the following activities:  Development of treatment plan with patient or surrogate, discussions with consultants, evaluation of patient's response to treatment, examination of patient, ordering and review of laboratory studies, ordering and review of radiographic studies, ordering and performing treatments and interventions, pulse oximetry, re-evaluation of patient's condition and review of old charts   I assumed direction of critical care for this patient from another provider in my specialty: no     Care discussed with: admitting provider      MEDICATIONS ORDERED IN ED: Medications  iohexol  (OMNIPAQUE ) 350 MG/ML injection 75 mL (75 mLs Intravenous Contrast Given 02/18/24 1825)     IMPRESSION / MDM / ASSESSMENT AND PLAN / ED COURSE  I reviewed the triage vital signs and the nursing notes.                              Differential diagnosis includes, but is not limited to, CVA, seizure, electrolyte abnormality, lower concern for cardiac etiology  Patient's presentation is most consistent with acute presentation with potential threat to life or bodily function.  Patient is a 89 year old female who originally presented to ED triage over episode of lightheadedness at the orthopedic clinic.  Approximately 1 hour  after arrival, I spoke with daughter who reports 20 minutes before interaction the patient had onset of expressive aphasia.  Patient with expressive aphasia but no other obvious focal neurological deficits.  NIHSS of 2.  Stroke alert called at this time.  Patient evaluated by neurology and in-depth discussion with the daughter and given symptoms similar to this in the past which possibly could have represented seizure, they decided against giving her TNK.  No thrombectomy either.  He recommends no immediate intervention.  CT head without contrast negative.  Neurology recommends admission to hospitalist here for further stroke workup versus potential seizure with MRI and EEG.  Patient and daughter agreeable with plan.  Patient admitted to hospitalist for further care.  The patient is on the cardiac monitor to evaluate for evidence of arrhythmia and/or significant heart rate changes. Clinical Course as of 02/18/24 1855  Tue Feb 18, 2024  1744 [facility]: Lambert Rhonda W into the room with nursing staff.  While in their, daughter at the bedside reports approximately 25 minutes ago she had onset of speech changes.  She reports she is having expressive aphasia.  On my exam patient does exhibit expressive aphasia.  She seems to follow commands well with no obvious strength deficits.  No obvious facial droop.  Stroke alert called [DW]  1831 Radiologist called - no acute findings on CT head [DW]  1833 Neurology - episodes similar to this in the past. High risk for bleeding and made decision not to pursue TNK. Work up for TIA vs seizure [DW]    Clinical Course User Index [DW] Malvina Alm DASEN, MD     FINAL CLINICAL IMPRESSION(S) / ED DIAGNOSES   Final diagnoses:  Expressive aphasia     Rx / DC Orders   ED Discharge Orders     None        Note:  This document was prepared using Dragon voice recognition software and may include unintentional dictation errors.   Malvina Alm DASEN, MD 02/18/24 [phone]

## 2024-02-18 NOTE — Assessment & Plan Note (Signed)
 Atorvastatin  40 mg at bedtime daily

## 2024-02-18 NOTE — Progress Notes (Signed)
 Followed up on code stroke called. Family in room pt in CT. No needs expressed at this time from family.

## 2024-02-18 NOTE — ED Notes (Signed)
 Family came out of room to notify staff that pt is having new symptoms. This RN assessed pt and pt noted to have some expressive aphasia. Per the daughter the pt has had issues with expressive aphasia on and off over the last couple of years. This episode started approximately at 1715. EDP notified and is at bedside.

## 2024-02-18 NOTE — Hospital Course (Signed)
 Kellie Harris is a 89 year old female with history degenerative joint disease, arthritis, hypertension, hypercholesteremia, who presents for chief concern of acute hypoxia at outpatient [facility] orthopedics clinic.  Vitals in the ED, showed T 98.2, rr 18, HR 94, BP 161/92, spO2 is 96% on RA.   Serum sodium 134, K 4.3, chloride 97, bicarb 20, BUN 13, sCr 0.95, eGFR 57, nonfasting blood glucose 118, wbc 18, hgb 14.4, platelets 324.  AST/ALT are within normal limits. T bili 1.4.  ED treatment: none at this time

## 2024-02-18 NOTE — ED Triage Notes (Signed)
 Pt presents to the ED via POV from [facility] clinic for [facility] and dizziness. O2 saturation 96% on RA

## 2024-02-18 NOTE — Assessment & Plan Note (Signed)
 Neurology has been consulted and we appreciate further recommendations Complete echo MRI of the brain Fasting lipid and A1c ordered Frequent neuro vascular checks N.p.o. pending swallow study PT, OT, SLP Fall precaution

## 2024-02-18 NOTE — ED Triage Notes (Signed)
 First Nurse Note: Patient to ED from [facility] orthopedics for SOB. Patient's O2 was 84% RA at clinic. Recently seen on 7/4 for an airway obstruction.

## 2024-02-19 ENCOUNTER — Encounter (HOSPITAL_COMMUNITY)

## 2024-02-19 ENCOUNTER — Inpatient Hospital Stay (HOSPITAL_COMMUNITY)
Admission: EM | Admit: 2024-02-19 | Discharge: 2024-02-22 | DRG: 091 | Disposition: A | Discharge status: Skilled Nursing Facility | Source: Ambulatory Visit | Attending: Internal Medicine | Admitting: Internal Medicine

## 2024-02-19 ENCOUNTER — Observation Stay
Admit: 2024-02-19 | Discharge: 2024-02-19 | Disposition: A | Discharge status: Home / Self Care | Attending: Internal Medicine

## 2024-02-19 ENCOUNTER — Encounter (HOSPITAL_COMMUNITY): Payer: Self-pay

## 2024-02-19 DIAGNOSIS — I10 Essential (primary) hypertension: Secondary | ICD-10-CM | POA: Diagnosis present

## 2024-02-19 DIAGNOSIS — G459 Transient cerebral ischemic attack, unspecified: Secondary | ICD-10-CM | POA: Diagnosis not present

## 2024-02-19 DIAGNOSIS — M1990 Unspecified osteoarthritis, unspecified site: Secondary | ICD-10-CM | POA: Diagnosis present

## 2024-02-19 DIAGNOSIS — I672 Cerebral atherosclerosis: Secondary | ICD-10-CM | POA: Diagnosis present

## 2024-02-19 DIAGNOSIS — Z66 Do not resuscitate: Secondary | ICD-10-CM | POA: Diagnosis present

## 2024-02-19 DIAGNOSIS — R4182 Altered mental status, unspecified: Secondary | ICD-10-CM | POA: Diagnosis not present

## 2024-02-19 DIAGNOSIS — R4701 Aphasia: Secondary | ICD-10-CM | POA: Diagnosis not present

## 2024-02-19 DIAGNOSIS — Z96651 Presence of right artificial knee joint: Secondary | ICD-10-CM | POA: Diagnosis present

## 2024-02-19 DIAGNOSIS — F0390 Unspecified dementia without behavioral disturbance: Secondary | ICD-10-CM | POA: Diagnosis not present

## 2024-02-19 DIAGNOSIS — D72829 Elevated white blood cell count, unspecified: Secondary | ICD-10-CM | POA: Diagnosis not present

## 2024-02-19 DIAGNOSIS — E876 Hypokalemia: Secondary | ICD-10-CM | POA: Diagnosis not present

## 2024-02-19 DIAGNOSIS — Z79899 Other long term (current) drug therapy: Secondary | ICD-10-CM

## 2024-02-19 DIAGNOSIS — I6389 Other cerebral infarction: Secondary | ICD-10-CM | POA: Diagnosis not present

## 2024-02-19 DIAGNOSIS — Z806 Family history of leukemia: Secondary | ICD-10-CM

## 2024-02-19 DIAGNOSIS — E785 Hyperlipidemia, unspecified: Secondary | ICD-10-CM | POA: Diagnosis present

## 2024-02-19 DIAGNOSIS — R42 Dizziness and giddiness: Secondary | ICD-10-CM | POA: Diagnosis present

## 2024-02-19 DIAGNOSIS — Z8673 Personal history of transient ischemic attack (TIA), and cerebral infarction without residual deficits: Secondary | ICD-10-CM | POA: Diagnosis not present

## 2024-02-19 DIAGNOSIS — Z7982 Long term (current) use of aspirin: Secondary | ICD-10-CM

## 2024-02-19 DIAGNOSIS — I959 Hypotension, unspecified: Secondary | ICD-10-CM | POA: Diagnosis not present

## 2024-02-19 DIAGNOSIS — Z96642 Presence of left artificial hip joint: Secondary | ICD-10-CM | POA: Diagnosis present

## 2024-02-19 DIAGNOSIS — Z85828 Personal history of other malignant neoplasm of skin: Secondary | ICD-10-CM | POA: Diagnosis not present

## 2024-02-19 DIAGNOSIS — I639 Cerebral infarction, unspecified: Secondary | ICD-10-CM | POA: Diagnosis not present

## 2024-02-19 DIAGNOSIS — R569 Unspecified convulsions: Secondary | ICD-10-CM | POA: Diagnosis not present

## 2024-02-19 DIAGNOSIS — I669 Occlusion and stenosis of unspecified cerebral artery: Secondary | ICD-10-CM | POA: Diagnosis not present

## 2024-02-19 DIAGNOSIS — Z823 Family history of stroke: Secondary | ICD-10-CM

## 2024-02-19 DIAGNOSIS — E7800 Pure hypercholesterolemia, unspecified: Secondary | ICD-10-CM | POA: Diagnosis not present

## 2024-02-19 DIAGNOSIS — I6612 Occlusion and stenosis of left anterior cerebral artery: Secondary | ICD-10-CM | POA: Diagnosis not present

## 2024-02-19 DIAGNOSIS — M069 Rheumatoid arthritis, unspecified: Secondary | ICD-10-CM | POA: Diagnosis not present

## 2024-02-19 DIAGNOSIS — M8580 Other specified disorders of bone density and structure, unspecified site: Secondary | ICD-10-CM | POA: Diagnosis present

## 2024-02-19 DIAGNOSIS — Z803 Family history of malignant neoplasm of breast: Secondary | ICD-10-CM

## 2024-02-19 LAB — ECHOCARDIOGRAM COMPLETE
AR max vel: 1.94 cm2
AV Area VTI: 2.09 cm2
AV Area mean vel: 1.79 cm2
AV Mean grad: 6 mmHg
AV Peak grad: 11.3 mmHg
Ao pk vel: 1.68 m/s
Area-P 1/2: 2.95 cm2
Calc EF: 56.4 %
Height: 59 in
P 1/2 time: 364 ms
S' Lateral: 1.7 cm
Single Plane A2C EF: 47.6 %
Single Plane A4C EF: 60.6 %
Weight: 1693.13 [oz_av]

## 2024-02-19 LAB — HEMOGLOBIN A1C
Hgb A1c MFr Bld: 4.9 % (ref 4.8–5.6)
Mean Plasma Glucose: 94 mg/dL

## 2024-02-19 LAB — LIPID PANEL
Cholesterol: 143 mg/dL (ref 0–200)
HDL: 49 mg/dL (ref >40)
LDL Cholesterol: 74 mg/dL (ref 0–99)
Total CHOL/HDL Ratio: 2.9 ratio
Triglycerides: 102 mg/dL (ref <150)
VLDL: 20 mg/dL (ref 0–40)

## 2024-02-19 LAB — CBC
HCT: 40.5 % (ref 36.0–46.0)
Hemoglobin: 13.6 g/dL (ref 12.0–15.0)
MCH: 30.2 pg (ref 26.0–34.0)
MCHC: 33.6 g/dL (ref 30.0–36.0)
MCV: 89.8 fL (ref 80.0–100.0)
Platelets: 238 K/uL (ref 150–400)
RBC: 4.51 MIL/uL (ref 3.87–5.11)
RDW: 14.6 % (ref 11.5–15.5)
WBC: 14 K/uL — ABNORMAL HIGH (ref 4.0–10.5)
nRBC: 0 % (ref 0.0–0.2)

## 2024-02-19 LAB — ETHANOL: Alcohol, Ethyl (B): <15 mg/dL (ref <15)

## 2024-02-19 MED ORDER — ASPIRIN 81 MG PO CHEW
81.0000 mg | CHEWABLE_TABLET | Freq: Every day | ORAL | Status: DC
  Administered 2024-02-20 – 2024-02-22 (×3): 81 mg via ORAL
  Filled 2024-02-19 (×3): qty 1

## 2024-02-19 MED ORDER — AMLODIPINE BESYLATE 5 MG PO TABS
10.0000 mg | ORAL_TABLET | Freq: Every day | ORAL | Status: DC
  Administered 2024-02-19: 10 mg via ORAL
  Filled 2024-02-19: qty 2

## 2024-02-19 MED ORDER — SODIUM CHLORIDE 0.9 % IV SOLN: 250.0000 mL | INTRAVENOUS | Status: AC | PRN

## 2024-02-19 MED ORDER — LEVETIRACETAM 500 MG PO TABS
500.0000 mg | ORAL_TABLET | Freq: Two times a day (BID) | ORAL | Status: DC
  Administered 2024-02-19: 500 mg via ORAL
  Filled 2024-02-19 (×2): qty 1

## 2024-02-19 MED ORDER — AMLODIPINE BESYLATE 5 MG PO TABS
10.0000 mg | ORAL_TABLET | Freq: Every day | ORAL | Status: DC
  Administered 2024-02-20 – 2024-02-22 (×3): 10 mg via ORAL
  Filled 2024-02-19 (×3): qty 2

## 2024-02-19 MED ORDER — LATANOPROST 0.005 % OP SOLN
1.0000 [drp] | Freq: Every day | OPHTHALMIC | Status: DC
  Administered 2024-02-19 – 2024-02-21 (×3): 1 [drp] via OPHTHALMIC
  Filled 2024-02-19: qty 2.5

## 2024-02-19 MED ORDER — ATORVASTATIN CALCIUM 40 MG PO TABS
40.0000 mg | ORAL_TABLET | Freq: Every day | ORAL | Status: DC
  Administered 2024-02-19 – 2024-02-21 (×3): 40 mg via ORAL
  Filled 2024-02-19 (×3): qty 1

## 2024-02-19 MED ORDER — SODIUM CHLORIDE 0.9% FLUSH
3.0000 mL | Freq: Two times a day (BID) | INTRAVENOUS | Status: DC
  Administered 2024-02-19 – 2024-02-21 (×3): 3 mL via INTRAVENOUS

## 2024-02-19 MED ORDER — ONDANSETRON HCL 4 MG/2ML IJ SOLN: 4.0000 mg | Freq: Four times a day (QID) | INTRAMUSCULAR | Status: DC | PRN

## 2024-02-19 MED ORDER — PANTOPRAZOLE SODIUM 20 MG PO TBEC
20.0000 mg | DELAYED_RELEASE_TABLET | Freq: Every day | ORAL | Status: DC
  Administered 2024-02-19 – 2024-02-22 (×4): 20 mg via ORAL
  Filled 2024-02-19 (×4): qty 1

## 2024-02-19 MED ORDER — LEVETIRACETAM 500 MG PO TABS: 500.0000 mg | ORAL_TABLET | Freq: Two times a day (BID) | ORAL | Status: DC

## 2024-02-19 MED ORDER — ONDANSETRON HCL 4 MG PO TABS: 4.0000 mg | ORAL_TABLET | Freq: Four times a day (QID) | ORAL | Status: DC | PRN

## 2024-02-19 MED ORDER — TIMOLOL MALEATE 0.5 % OP SOLN
1.0000 [drp] | Freq: Every day | OPHTHALMIC | Status: DC
  Administered 2024-02-19 – 2024-02-22 (×4): 1 [drp] via OPHTHALMIC
  Filled 2024-02-19: qty 5

## 2024-02-19 MED ORDER — ACETAMINOPHEN 650 MG RE SUPP: 650.0000 mg | Freq: Four times a day (QID) | RECTAL | Status: DC | PRN

## 2024-02-19 MED ORDER — ACETAMINOPHEN 325 MG PO TABS
650.0000 mg | ORAL_TABLET | Freq: Four times a day (QID) | ORAL | Status: DC | PRN
Start: 2024-02-19 — End: 2024-02-22

## 2024-02-19 MED ORDER — LOSARTAN POTASSIUM 50 MG PO TABS
25.0000 mg | ORAL_TABLET | Freq: Every day | ORAL | Status: DC
  Administered 2024-02-19: 25 mg via ORAL
  Filled 2024-02-19: qty 1

## 2024-02-19 MED ORDER — METOPROLOL SUCCINATE ER 25 MG PO TB24
12.5000 mg | ORAL_TABLET | Freq: Every day | ORAL | Status: DC
  Administered 2024-02-19 – 2024-02-22 (×4): 12.5 mg via ORAL
  Filled 2024-02-19 (×4): qty 1

## 2024-02-19 MED ORDER — VITAMIN D 25 MCG (1000 UNIT) PO TABS
2000.0000 [IU] | ORAL_TABLET | Freq: Every day | ORAL | Status: DC
  Administered 2024-02-19 – 2024-02-22 (×4): 2000 [IU] via ORAL
  Filled 2024-02-19 (×4): qty 2

## 2024-02-19 MED ORDER — ASPIRIN 81 MG PO CHEW: 81.0000 mg | CHEWABLE_TABLET | Freq: Two times a day (BID) | ORAL | Status: DC

## 2024-02-19 MED ORDER — SODIUM CHLORIDE 0.9% FLUSH: 3.0000 mL | INTRAVENOUS | Status: DC | PRN

## 2024-02-19 MED ORDER — VITAMIN B-12 1000 MCG PO TABS
1000.0000 ug | ORAL_TABLET | Freq: Every day | ORAL | Status: DC
  Administered 2024-02-19 – 2024-02-22 (×4): 1000 ug via ORAL
  Filled 2024-02-19 (×4): qty 1

## 2024-02-19 MED ORDER — ENOXAPARIN SODIUM 30 MG/0.3ML IJ SOSY
30.0000 mg | PREFILLED_SYRINGE | INTRAMUSCULAR | Status: DC
  Administered 2024-02-19 – 2024-02-21 (×3): 30 mg via SUBCUTANEOUS
  Filled 2024-02-19 (×3): qty 0.3

## 2024-02-19 MED ORDER — SODIUM CHLORIDE 0.9% FLUSH
3.0000 mL | Freq: Two times a day (BID) | INTRAVENOUS | Status: DC
  Administered 2024-02-19 – 2024-02-22 (×6): 3 mL via INTRAVENOUS

## 2024-02-19 MED ORDER — LOSARTAN POTASSIUM 25 MG PO TABS
25.0000 mg | ORAL_TABLET | Freq: Every day | ORAL | Status: DC
  Administered 2024-02-20 – 2024-02-22 (×3): 25 mg via ORAL
  Filled 2024-02-19 (×3): qty 1

## 2024-02-19 NOTE — H&P (Signed)
 History and Physical    Kellie Harris FMW:[id number] DOB: 1930/12/20 DOA: 02/19/2024  PCP: [name], MD  Patient coming from: [facility]  Chief Complaint: Aphasic episodes   HPI: Kellie Harris is a 89 year old female with degenerative joint disease, arthritis, hypertension, hypercholesterolemia.  [name] was at her outpatient [facility] orthopedic clinic when she was noted to be dizzy and short of breath and was recommended to come to the ED.  Not long after arrival she became acutely aphasic.  Neurology was consulted and patient was admitted for workup. Patient has had multiple episodes similar to this in the past which have been negative for stroke/TIA workup.  1 prior MRI did reveal a small right frontal infarct.  MRI this admission shows a punctate focus of diffusion abnormality concerning for acute to early subacute ischemic infarct in the left frontal lobe.  Per neurology review this is unlikely to be causing symptoms.  Given consistency of symptoms in the past there is concern for underlying seizure activity.  She was loaded with Keppra  and started on Keppra  500 mg twice daily.  Neurology has recommended long-term EEG to better capture and characterize these spells.  She was transferred to [facility] on 02/19/24.   Review of Systems: As per HPI. Otherwise, all other review of systems reviewed and are negative.   Past Medical History:  Diagnosis Date   Arthritis    right knee, left shoulder/ OSTEO   Cancer (HCC)    skin    Degenerative disc disease, lumbar    L4-L5   Diverticulosis    ITIS   History of hiatal hernia    Hydronephrosis    right   Hypercholesteremia    Hypertension    CONTROLLED ON MEDS   IBS (irritable bowel syndrome)    Knee fracture, right 03/05/2015   DIFFICULTY WITH AMBULATION   Osteopenia    Ovarian cyst    Pancreatic insufficiency    Stroke [facility]) 2025   unsure if TIA or stroke, no deficits   Wears hearing aid    bilateral    Past Surgical History:   Procedure Laterality Date   CATARACT EXTRACTION W/PHACO Right 11/02/2015   Procedure: CATARACT EXTRACTION PHACO AND INTRAOCULAR LENS PLACEMENT (IOC);  Surgeon: Dene Etienne, MD;  Location: [facility];  Service: Ophthalmology;  Laterality: Right;   CATARACT EXTRACTION W/PHACO Left 07/18/2016   Procedure: CATARACT EXTRACTION PHACO AND INTRAOCULAR LENS PLACEMENT (IOC);  Surgeon: Dene Etienne, MD;  Location: [facility];  Service: Ophthalmology;  Laterality: Left;   COLONOSCOPY     ESOPHAGOGASTRODUODENOSCOPY N/A 02/14/2024   Procedure: EGD (ESOPHAGOGASTRODUODENOSCOPY);  Surgeon: Maryruth Ole DASEN, MD;  Location: [facility] ENDOSCOPY;  Service: Endoscopy;  Laterality: N/A;   FRACTURE SURGERY Left    hip   HARDWARE REMOVAL Left 09/04/2021   Procedure: HARDWARE REMOVAL;  Surgeon: Leora Lynwood SAUNDERS, MD;  Location: [facility] ORS;  Service: Orthopedics;  Laterality: Left;   history of left hip fracture     INTRAMEDULLARY (IM) NAIL INTERTROCHANTERIC Left 02/12/2017   Procedure: INTRAMEDULLARY (IM) NAIL INTERTROCHANTRIC;  Surgeon: Cleotilde Barrio, MD;  Location: [facility] ORS;  Service: Orthopedics;  Laterality: Left;   kidney stent     OOPHORECTOMY Bilateral 2015   ORIF WRIST FRACTURE Right 04/08/2018   Procedure: OPEN REDUCTION INTERNAL FIXATION (ORIF) WRIST FRACTURE;  Surgeon: Celena Sharper, MD;  Location: [facility] OR;  Service: Orthopedics;  Laterality: Right;   TONSILLECTOMY     TOTAL HIP ARTHROPLASTY Left 09/04/2021   Procedure: TOTAL HIP  ARTHROPLASTY ANTERIOR APPROACH;  Surgeon: Leora Lynwood SAUNDERS, MD;  Location: [facility] ORS;  Service: Orthopedics;  Laterality: Left;   TOTAL KNEE ARTHROPLASTY Right 08/07/2017   Procedure: TOTAL KNEE ARTHROPLASTY;  Surgeon: Cleotilde Barrio, MD;  Location: [facility] ORS;  Service: Orthopedics;  Laterality: Right;     reports that she has never smoked. She has never used smokeless tobacco. She reports that she does not drink alcohol and does not use drugs.  No Known  Allergies  Family History  Problem Relation Age of Onset   Breast cancer Mother 76   Leukemia Mother    Stroke Father     Prior to Admission medications   Medication Sig Start Date End Date Taking? Authorizing Provider  amLODipine  (NORVASC ) 10 MG tablet Take 10 mg by mouth daily. 11/06/23   [provider]  aspirin  81 MG chewable tablet Chew 1 tablet (81 mg total) by mouth 2 (two) times daily. 09/06/21   Joshua Lin, PA-C  atorvastatin  (LIPITOR) 40 MG tablet Take 40 mg by mouth.  Take 1 tablet (40 mg total) by mouth once daily 02/13/24   [provider]  Cholecalciferol  (VITAMIN D ) 50 MCG (2000 UT) tablet Take 2,000 Units by mouth daily.    [provider]  latanoprost  (XALATAN ) 0.005 % ophthalmic solution SMARTSIG:In Eye(s) 05/21/22   [provider]  levETIRAcetam  (KEPPRA ) 500 MG tablet Take 1 tablet (500 mg total) by mouth 2 (two) times daily. 02/19/24   Dezii, Alexandra, DO  losartan  (COZAAR ) 25 MG tablet Take 25 mg by mouth daily.    [provider]  metoprolol  succinate (TOPROL -XL) 25 MG 24 hr tablet Take 12.5 mg by mouth daily. 03/02/21   [provider]  pantoprazole  (PROTONIX ) 20 MG tablet Take 1 tablet (20 mg total) by mouth daily. 02/14/24 02/13/25  Maryruth Ole DASEN, MD  timolol  (TIMOPTIC ) 0.5 % ophthalmic solution Apply 1 drop to eye.  Place 1 drop into both eyes once daily 06/10/23   [provider]  vitamin B-12 1000 MCG tablet Take 1 tablet (1,000 mcg total) by mouth daily. 09/10/21   Laurita Pillion, MD    Physical Exam: There were no vitals filed for this visit.   Constitutional: NAD, calm, comfortable Eyes: PERRL, lids and conjunctivae normal Respiratory: Clear to auscultation bilaterally, no wheezing, no crackles. Normal respiratory effort. No accessory muscle use.  Cardiovascular: Regular rate and rhythm, no murmurs. No extremity edema.  Abdomen: Soft, nondistended Musculoskeletal: No joint deformity upper and  lower extremities. No contractures. Normal muscle tone.  Neurologic: Alert  Psychiatric: Normal mood and affect   Labs on Admission: I have personally reviewed following labs and imaging studies  CBC: Recent Labs  Lab 02/18/24 1546 02/18/24 1756  WBC 16.3* 16.0*  NEUTROABS  --  14.1*  HGB 14.9 14.4  HCT 45.8 42.6  MCV 91.2 88.6  PLT 329 324   Basic Metabolic Panel: Recent Labs  Lab 02/18/24 1546  NA 134*  K 4.3  CL 97*  CO2 20*  GLUCOSE 118*  BUN 13  CREATININE 0.95  CALCIUM  10.3   GFR: Estimated Creatinine Clearance: 26.8 mL/min (by C-G formula based on SCr of 0.95 mg/dL). Liver Function Tests: Recent Labs  Lab 02/18/24 1546  AST 26  ALT 13  ALKPHOS 68  BILITOT 1.4*  PROT 8.2*  ALBUMIN 5.1*   No results for input(s): LIPASE, AMYLASE in the last 168 hours. No results for input(s): AMMONIA in the last 168 hours. Coagulation Profile: Recent Labs  Lab  02/18/24 1756  INR 1.0   Cardiac Enzymes: No results for input(s): CKTOTAL, CKMB, CKMBINDEX, TROPONINI in the last 168 hours. BNP (last 3 results) No results for input(s): PROBNP in the last 8760 hours. HbA1C: No results for input(s): HGBA1C in the last 72 hours. CBG: Recent Labs  Lab 02/18/24 1743  GLUCAP 121*   Lipid Profile: Recent Labs    02/19/24 0443  CHOL 143  HDL 49  LDLCALC 74  TRIG 102  CHOLHDL 2.9   Thyroid  Function Tests: No results for input(s): TSH, T4TOTAL, FREET4, T3FREE, THYROIDAB in the last 72 hours. Anemia Panel: No results for input(s): VITAMINB12, FOLATE, FERRITIN, TIBC, IRON, RETICCTPCT in the last 72 hours. Urine analysis:    Component Value Date/Time   COLORURINE YELLOW 09/07/2021 1243   APPEARANCEUR CLEAR 09/07/2021 1243   APPEARANCEUR Clear 07/08/2014 2155   LABSPEC 1.020 09/07/2021 1243   LABSPEC 1.003 07/08/2014 2155   PHURINE 7.0 09/07/2021 1243   GLUCOSEU NEGATIVE 09/07/2021 1243   GLUCOSEU Negative 07/08/2014 2155    HGBUR TRACE (A) 09/07/2021 1243   BILIRUBINUR NEGATIVE 09/07/2021 1243   BILIRUBINUR Negative 07/08/2014 2155   KETONESUR 15 (A) 09/07/2021 1243   PROTEINUR 100 (A) 09/07/2021 1243   NITRITE NEGATIVE 09/07/2021 1243   LEUKOCYTESUR NEGATIVE 09/07/2021 1243   LEUKOCYTESUR Negative 07/08/2014 2155   Sepsis Labs: !!!!!!!!!!!!!!!!!!!!!!!!!!!!!!!!!!!!!!!!!!!! @LABRCNTIP (procalcitonin:4,lacticidven:4) )No results found for this or any previous visit (from the past 240 hours).   Radiological Exams on Admission: MR BRAIN WO CONTRAST Result Date: 02/19/2024 CLINICAL DATA:  Initial evaluation for acute neuro deficit, stroke suspected. EXAM: MRI HEAD WITHOUT CONTRAST TECHNIQUE: Multiplanar, multiecho pulse sequences of the brain and surrounding structures were obtained without intravenous contrast. COMPARISON:  CTs from earlier the same day. FINDINGS: Brain: Examination degraded by motion artifact. Generalized age-related cerebral atrophy. Patchy T2/FLAIR hyperintensity involving the periventricular and deep white matter, consistent with chronic small vessel ischemic disease, moderately advanced in nature. Few scatter remote lacunar infarcts present about the right frontal corona radiata and thalami. Small remote cerebellar infarcts, right greater than left. w single punctate focus of mild diffusion signal abnormality seen involving the high left frontal lobe (series 5, image 46), suspicious for a small acute to early subacute ischemic infarct. Initial this is not well seen on corresponding coronal DWI sequence. No associated hemorrhage. No other evidence for acute or subacute ischemia. Gray-white matter differentiation otherwise maintained. No acute intracranial hemorrhage. Few punctate chronic micro hemorrhages noted within the right cerebellum and left thalamus. No mass lesion, midline shift or mass effect. Mild ventricular prominence related global parenchymal volume loss without hydrocephalus. No  extra-axial fluid collection. Pituitary gland within normal limits. Vascular: Major intracranial vascular flow voids are maintained. Skull and upper cervical spine: Craniocervical junction within normal limits. Bone marrow signal intensity within normal limits. No scalp soft tissue abnormality. Sinuses/Orbits: Globes orbital soft tissues demonstrate no acute finding. Prior bilateral ocular lens replacement. Paranasal sinuses are largely clear. No significant mastoid effusion. Other: None. IMPRESSION: 1. Single punctate focus of diffusion signal abnormality involving the high left frontal lobe, suspicious for a small acute to early subacute ischemic infarct. No associated hemorrhage. 2. No other acute intracranial abnormality. 3. Age-related cerebral atrophy with moderate chronic small vessel ischemic disease, with a few scattered remote lacunar infarcts involving the right frontal corona radiata, thalami, and cerebellum. Electronically Signed   By: Morene Hoard M.D.   On: 02/19/2024 00:26   DG Chest Port 1 View Result Date: 02/18/2024 CLINICAL DATA:  Altered  mental status, leukocytosis EXAM: PORTABLE CHEST 1 VIEW COMPARISON:  09/07/2021 FINDINGS: Heart and mediastinal contours are within normal limits. No focal opacities or effusions. No acute bony abnormality. Aortic atherosclerosis. IMPRESSION: No active disease. Electronically Signed   By: Franky Crease M.D.   On: 02/18/2024 20:38   CT ANGIO HEAD NECK W WO CM (CODE STROKE) Result Date: 02/18/2024 CLINICAL DATA:  Provided history: Stroke, follow-up. EXAM: CT ANGIOGRAPHY HEAD AND NECK WITH AND WITHOUT CONTRAST TECHNIQUE: Multidetector CT imaging of the head and neck was performed using the standard protocol during bolus administration of intravenous contrast. Multiplanar CT image reconstructions and MIPs were obtained to evaluate the vascular anatomy. Carotid stenosis measurements (when applicable) are obtained utilizing NASCET criteria, using the distal  internal carotid diameter as the denominator. RADIATION DOSE REDUCTION: This exam was performed according to the departmental dose-optimization program which includes automated exposure control, adjustment of the mA and/or kV according to patient size and/or use of iterative reconstruction technique. CONTRAST:  75mL OMNIPAQUE  IOHEXOL  350 MG/ML SOLN COMPARISON:  Noncontrast head CT performed earlier today 02/18/2024. CT angiogram head/neck 04/03/2018. FINDINGS: CTA NECK FINDINGS Aortic arch: Standard aortic branching. Atherosclerotic plaque within the visible thoracic aorta and proximal major branch vessels of the neck. Atherosclerotic plaque within the visible thoracic aorta and proximal major branch vessels of the neck. Streak/beam hardening artifact arising from a dense contrast bolus partially obscures the right subclavian artery. Within this limitation, there is no appreciable hemodynamically significant innominate or proximal subclavian artery stenosis. Right carotid system: CCA and ICA patent within the neck without stenosis. Mild atherosclerotic plaque about the carotid bifurcation. Left carotid system: CCA and ICA patent within the neck without measurable stenosis. Nonstenotic atherosclerotic plaque at the CCA origin, about the carotid bifurcation and within the proximal ICA. Vertebral arteries: Patent within the neck. The right vertebral artery is slightly dominant. Nonstenotic calcified plaque at the right vertebral artery origin and within the right V3 segment. Nonstenotic calcified plaque within the proximal left V3 segment. Atherosclerotic plaque within the left vertebral artery at the V3/V4 junction which has progressed and now results in at least moderate stenosis. Skeleton: Nonspecific reversal of the expected cervical doses. Grade 1 anterolisthesis at C2-C3. Cervical spondylosis and multilevel vertebral ankylosis. No acute fracture or aggressive osseous lesion. Other neck: No neck mass or cervical  lymphadenopathy. Upper chest: No consolidation within the imaged lung apices. Review of the MIP images confirms the above findings CTA HEAD FINDINGS Anterior circulation: The intracranial internal carotid arteries are patent. Nonstenotic calcified plaque within both vessels The M1 middle cerebral arteries are patent. No M2 proximal branch occlusion or high-grade proximal stenosis. The anterior cerebral arteries are patent. New sites of up to moderate stenosis within the left anterior cerebral artery A4 segment. Unchanged 3 mm ventrally projecting vascular protrusion arising from the right middle cerebral artery distal M1 segment, which may reflect an aneurysm or infundibulum. Posterior circulation: The intracranial vertebral arteries are patent. Nonstenotic atherosclerotic plaque within the intracranial right vertebral atherosclerotic plaque within the intracranial right vertebral artery with no more than mild stenosis. Progressive atherosclerotic plaque within the left vertebral artery at the V3/V4 junction, now with at least moderate stenosis. The basilar artery is patent. The posterior cerebral arteries are patent. Moderate stenosis within the right PCA P2 segment, new from the prior exam. Moderate to severe stenosis within the left PCA at the P2/P3 junction, also new from the prior exam. Posterior communicating arteries are diminutive or absent, bilaterally. Venous sinuses: Contrast timing precludes  evaluation for dural venous sinus thrombosis. Anatomic variants: As described. Review of the MIP images confirms the above findings No emergent large vessel occlusion identified. These results were communicated to Dr. Voncile at 7:34 pmon 7/8/2025by text page via the [facility] messaging system. IMPRESSION: CTA neck: 1. The common carotid and internal carotid arteries are patent within the neck without stenosis. Mild atherosclerotic plaque bilaterally, as described. 2. The vertebral arteries are patent within the neck.  Atherosclerotic plaque within the left vertebral artery at the V3/V4 junction, progressed from the prior CTA of 04/03/2018 and now resulting in at least moderate stenosis. Non-stenotic atherosclerotic plaque elsewhere within bilateral cervical vertebral arteries. 3. Aortic Atherosclerosis (ICD10-I70.0). CTA head: 1. No proximal intracranial large vessel occlusion identified. 2. Intracranial atherosclerotic disease with multifocal stenoses, most notably as follows. 3. Atherosclerotic plaque within the left vertebral artery at the V3/V4 junction, progressed from the prior CTA of 04/03/2018 and now resulting in at least moderate stenosis. 4. New moderate-to-severe stenosis within the left posterior cerebral artery at the P3/P4 junction. 5. New moderate stenosis within the right PCA P2 segment. 6. New sites of up to moderate stenosis within the left anterior cerebral artery A4 segment. 7. Unchanged 3 mm vascular protrusion arising from the right middle cerebral artery distal M1 segment, which may reflect an aneurysm or infundibulum. Electronically Signed   By: Rockey Childs D.O.   On: 02/18/2024 19:34   CT HEAD CODE STROKE WO CONTRAST Result Date: 02/18/2024 CLINICAL DATA:  Code stroke. Neuro deficit, acute, stroke suspected. Shortness of breath. Dizziness. EXAM: CT HEAD WITHOUT CONTRAST TECHNIQUE: Contiguous axial images were obtained from the base of the skull through the vertex without intravenous contrast. RADIATION DOSE REDUCTION: This exam was performed according to the departmental dose-optimization program which includes automated exposure control, adjustment of the mA and/or kV according to patient size and/or use of iterative reconstruction technique. COMPARISON:  Head CT 02/06/2024. Brain MRI 06/01/2022. FINDINGS: Brain: Generalized cerebral atrophy. Known chronic infarcts within the right corona radiata, thalami, left occipital lobe and bilateral cerebellar hemispheres, some of which were better appreciated  on the prior brain MRI of 06/01/2022. Background advanced patchy and ill-defined hypoattenuation within the cerebral white matter, nonspecific but compatible chronic small vessel fall ischemic disease. There is no acute intracranial hemorrhage. No acute demarcated cortical infarct. No extra-axial fluid collection. No evidence of an intracranial mass. No midline shift. Vascular: No hyperdense vessel. Atherosclerotic calcifications. Skull: No calvarial fracture or aggressive osseous lesion. Sinuses/Orbits: No mass or acute finding within the imaged orbits. No significant paranasal sinus disease at the imaged levels. ASPECTS [facility] Stroke Program Early CT Score) - Ganglionic level infarction (caudate, lentiform nuclei, internal capsule, insula, M1-M3 cortex): 7 - Supraganglionic infarction (M4-M6 cortex): 3 Total score (0-10 with 10 being normal): 10 (when discounting chronic infarcts). No evidence of an acute intracranial abnormality. These results were called by telephone at the time of interpretation on 02/18/2024 at 6:30 pm to provider DAVID [facility] , who verbally acknowledged these results. IMPRESSION: 1. No evidence of an acute intracranial abnormality. 2. Parenchymal atrophy, chronic small vessel ischemic disease and unchanged chronic infarcts, as described. Electronically Signed   By: Rockey Childs D.O.   On: 02/18/2024 18:31    Assessment/Plan Principal Problem:   Aphasia Active Problems:   Essential hypertension   HLD (hyperlipidemia)   Leukocytosis   Stereotypic episodes of aphasia/speech arrest - Cannot rule out dementia associated speech arrest versus seizure activity.  Multiple similar episodes in the past. - S/p  Keppra  load and now of 500mg  BID - Transferred to [facility] for LTM EEG - Neurology, Dr. Jerrie aware   Leukocytosis - Likely reactive - CXR unremarkable - UA ordered    Arthritis - Pain control    Hypertension - Norvasc , losartan , toprol     Hypercholesterolemia -  Lipitor     DVT prophylaxis: lovenox  enoxaparin  (LOVENOX ) injection 40 mg Start: 02/19/24 1745Code Status: DNR, confirmed with daughter  Family Communication: Daughter Disposition Plan: Home  Consults called: Neurology    Severity of Illness: The appropriate patient status for this patient is INPATIENT. Inpatient status is judged to be reasonable and necessary in order to provide the required intensity of service to ensure the patient's safety. The patient's presenting symptoms, physical exam findings, and initial radiographic and laboratory data in the context of their chronic comorbidities is felt to place them at high risk for further clinical deterioration. Furthermore, it is not anticipated that the patient will be medically stable for discharge from the hospital within 2 midnights of admission.   * I certify that at the point of admission it is my clinical judgment that the patient will require inpatient hospital care spanning beyond 2 midnights from the point of admission due to high intensity of service, high risk for further deterioration and high frequency of surveillance required.Kellie Delon Hoe, DO [facility] 02/19/2024, 5:00 PM   Available via Epic secure chat 7am-7pm After these hours, please refer to coverage provider listed on [email or web address]

## 2024-02-19 NOTE — Plan of Care (Signed)
  Problem: Safety: Goal: Verbalization of understanding the information provided will improve Outcome: Progressing   Problem: Self-Concept: Goal: Level of anxiety will decrease Outcome: Progressing   Problem: Self-Concept: Goal: Ability to verbalize feelings about condition will improve Outcome: Progressing   Problem: Education: Goal: Knowledge of General Education information will improve Description: Including pain rating scale, medication(s)/side effects and non-pharmacologic comfort measures Outcome: Progressing   Problem: Health Behavior/Discharge Planning: Goal: Ability to manage health-related needs will improve Outcome: Progressing

## 2024-02-19 NOTE — ED Notes (Signed)
 Pt incontinent to bladder and bowels. Pt cleaned up. New brief and sheets applied.

## 2024-02-19 NOTE — Evaluation (Signed)
 Occupational Therapy Evaluation Patient Details Name: Kellie Harris MRN: [id number] DOB: 09-29-1930 Today's Date: 02/19/2024   History of Present Illness   [name] is a 89 year old female with history degenerative joint disease, arthritis, hypertension, hypercholesteremia, who presents for chief concern of acute hypoxia at outpatient [facility] orthopedics clinic. 1 MRI in the past positive for a small right frontal infarct, presenting for episode of aphasia-more expressive aphasia than receptive aphasia.  She continues to be aphasic at this time. MRI done shows a punctate focus of diffusion abnormality concerning for a acute to early subacute ischemic infarct in the high left frontal lobe-that small punctate infarct, especially subacute looking is less likely to have been the cause of her aphasia of sudden onset yesterday at the ER.     Clinical Impressions Patient presenting with decreased Ind in self care,balance,functional mobility/transfers, endurance, and safety awareness. Per  chart review, pt has been Mod I at and living at home alone with family assistance for IADLs. Pt is very confused and having difficulty even stating her own name during session. Pt follows 1 step commands with increased time and mod multimodal cues. Pt dons socks with min A while seated on EOB.  Pt ambulation with min HHA and RR increased to 38 and HR into 120's with task. Patient will benefit from acute OT to increase overall independence in the areas of ADLs, functional mobility, and safety awareness in order to safely discharge.     If plan is discharge home, recommend the following:   A little help with walking and/or transfers;A little help with bathing/dressing/bathroom;Assistance with cooking/housework;Assist for transportation     Functional Status Assessment   Patient has had a recent decline in their functional status and demonstrates the ability to make significant improvements in function in a reasonable  and predictable amount of time.     Equipment Recommendations   Other (comment) (defer to next venue of care)      Precautions/Restrictions   Precautions Precautions: Fall Recall of Precautions/Restrictions: Impaired     Mobility Bed Mobility Overal bed mobility: Modified Independent                  Transfers Overall transfer level: Needs assistance Equipment used: 1 person hand held assist Transfers: Sit to/from Stand Sit to Stand: Min assist                  Balance Overall balance assessment: Needs assistance Sitting-balance support: Feet supported Sitting balance-Leahy Scale: Good     Standing balance support: Single extremity supported, During functional activity Standing balance-Leahy Scale: Fair                             ADL either performed or assessed with clinical judgement   ADL Overall ADL's : Needs assistance/impaired                     Lower Body Dressing: Minimal assistance;Sitting/lateral leans   Toilet Transfer: Minimal assistance Toilet Transfer Details (indicate cue type and reason): simulated                 Vision Patient Visual Report: No change from baseline              Pertinent Vitals/Pain Pain Assessment Pain Assessment: No/denies pain     Extremity/Trunk Assessment Upper Extremity Assessment Upper Extremity Assessment: Generalized weakness   Lower Extremity Assessment Lower Extremity Assessment: Generalized  weakness       Communication Communication Communication: Impaired Factors Affecting Communication: Hearing impaired   Cognition Arousal: Alert Behavior During Therapy: WFL for tasks assessed/performed Cognition: Cognition impaired, No family/caregiver present to determine baseline             OT - Cognition Comments: Pt having difficulty telling her name.                 Following commands: Impaired Following commands impaired: Follows one step  commands inconsistently     Cueing  General Comments   Cueing Techniques: Verbal cues;Gestural cues              Home Living Family/patient expects to be discharged to:: Private residence Living Arrangements: Alone Available Help at Discharge: Family;Available PRN/intermittently Type of Home: House Home Access: Stairs to enter [employer] of Steps: 2 Entrance Stairs-Rails: Left Home Layout: One level     Bathroom Shower/Tub: Tub/shower unit         Home Equipment: Agricultural consultant (2 wheels);Rollator (4 wheels);Shower seat;Hand held shower head          Prior Functioning/Environment Prior Level of Function : Independent/Modified Independent               ADLs Comments: Pt lives alone and family assists with IADLs per chart review. Pt reports being Ind with bathing and dressing.    OT Problem List: Decreased strength;Decreased cognition;Decreased safety awareness;Decreased activity tolerance;Decreased knowledge of use of DME or AE;Impaired balance (sitting and/or standing);Decreased knowledge of precautions   OT Treatment/Interventions: Self-care/ADL training;Therapeutic activities;Therapeutic exercise;Energy conservation;Patient/family education;Balance training;DME and/or AE instruction      OT Goals(Current goals can be found in the care plan section)   Acute Rehab OT Goals Patient Stated Goal: to go home OT Goal Formulation: With patient Time For Goal Achievement: 03/04/24 Potential to Achieve Goals: Fair ADL Goals Pt Will Perform Grooming: with supervision Pt Will Perform Lower Body Dressing: with supervision Pt Will Transfer to Toilet: with supervision Pt Will Perform Toileting - Clothing Manipulation and hygiene: with supervision   OT Frequency:  Min 2X/week    Co-evaluation PT/OT/SLP Co-Evaluation/Treatment: Yes Reason for Co-Treatment: Necessary to address cognition/behavior during functional activity;For patient/therapist  safety;To address functional/ADL transfers PT goals addressed during session: Mobility/safety with mobility;Balance OT goals addressed during session: ADL's and self-care      AM-PAC OT 6 Clicks Daily Activity     Outcome Measure Help from another person eating meals?: None Help from another person taking care of personal grooming?: A Little Help from another person toileting, which includes using toliet, bedpan, or urinal?: A Lot Help from another person bathing (including washing, rinsing, drying)?: A Lot Help from another person to put on and taking off regular upper body clothing?: A Little Help from another person to put on and taking off regular lower body clothing?: A Lot 6 Click Score: 16   End of Session Nurse Communication: Mobility status  Activity Tolerance: Patient tolerated treatment well Patient left: in bed  OT Visit Diagnosis: Unsteadiness on feet (R26.81);Repeated falls (R29.6);Muscle weakness (generalized) (M62.81)                Time: 8979-8968 OT Time Calculation (min): 11 min Charges:  OT General Charges $OT Visit: 1 Visit OT Evaluation $OT Eval Low Complexity: 1 Low  Izetta Claude, MS, OTR/L , CBIS ascom [phone]  02/19/24, 3:46 PM

## 2024-02-19 NOTE — Progress Notes (Signed)
 SLP Cancellation Note  Patient Details Name: Kellie Harris MRN: [id number] DOB: 1931/06/08   Cancelled treatment:       Reason Eval/Treat Not Completed: Patient at procedure or test/unavailable  Chart review completed, with pt diagnoses with Stereotypic episodes of aphasia/speech arrest. Neuro recommending LTM EEG requiring transfer to [facility]. Pt unavailable when SLP attempted evaluation (EKG in progress). Discharge note now placed. Recommend follow up SLP services at next venue of care.  [place] Naliah Eddington Clapp, MS, CCC-SLP Speech Language Pathologist Rehab Services; [facility] [phone] (ascom)      [place] J Clapp 02/19/2024, 2:34 PM

## 2024-02-19 NOTE — ED Notes (Signed)
 Signature pad not working in room. Consent for transfer signed by daughter on paper copy.

## 2024-02-19 NOTE — Progress Notes (Signed)
 Received from [facility] into room.  Daughter accompanied.  Oriented to room and surroundings.

## 2024-02-19 NOTE — ED Notes (Addendum)
 Pt out of bed and removed all clothes. Pt redirected to bed. Bed alarm set.

## 2024-02-19 NOTE — Progress Notes (Addendum)
 NEUROLOGY CONSULT FOLLOW UP NOTE   Date of service: February 19, 2024 Patient Name: Kellie Harris MRN:  [id number] DOB:  Mar 08, 1931  Interval Hx/subjective  Patient seen and examined.  Waiting for a bed-currently boarding in the ER boarding his own.  Vitals   Vitals:   02/19/24 0300 02/19/24 0400 02/19/24 0430 02/19/24 0802  BP: (!) 157/103 (!) 178/84 (!) 161/80 (!) 143/92  Pulse: 71 82 79 62  Resp: 18 18 17 18   Temp:      TempSrc:      SpO2: 98% 100% 96% 100%  Weight:      Height:         Body mass index is 21.37 kg/m.  Physical Exam   General: Awake alert in no distress HEENT: Normocephalic atraumatic Lungs: Clear Cardiovascular: Regular rhythm Abdomen nondistended nontender Neurological exam She is awake alert. She still remains aphasic.  When I asked her to name simple objects, such as my glasses, she said I need 1 of those but would not name the object.  Her speech is not dysarthric.  She also has some difficulty inconsistently following commands. Cranial nerves II to XII intact Motor examination-exam somewhat limited by arthritis in all 4 extremities but no focal weakness or asymmetry noted. Sensation intact to light touch   Medications  Current Facility-Administered Medications:     stroke: early stages of recovery book, , Does not apply, Once, Cox, Amy N, DO   acetaminophen  (TYLENOL ) tablet 650 mg, 650 mg, Oral, Q4H PRN, 650 mg at 02/18/24 2121 **OR** acetaminophen  (TYLENOL ) 160 MG/5ML solution 650 mg, 650 mg, Per Tube, Q4H PRN **OR** acetaminophen  (TYLENOL ) suppository 650 mg, 650 mg, Rectal, Q4H PRN, Cox, Amy N, DO   atorvastatin  (LIPITOR) tablet 40 mg, 40 mg, Oral, QHS, Cox, Amy N, DO, 40 mg at 02/18/24 2121   levETIRAcetam  (KEPPRA ) tablet 500 mg, 500 mg, Oral, BID, Cox, Amy N, DO   senna-docusate (Senokot-S) tablet 1 tablet, 1 tablet, Oral, QHS PRN, Cox, Amy N, DO  Current Outpatient Medications:    amLODipine  (NORVASC ) 10 MG tablet, Take 10 mg by mouth daily.,  Disp: , Rfl:    aspirin  81 MG chewable tablet, Chew 1 tablet (81 mg total) by mouth 2 (two) times daily., Disp: 60 tablet, Rfl: 0   atorvastatin  (LIPITOR) 40 MG tablet, Take 40 mg by mouth.  Take 1 tablet (40 mg total) by mouth once daily, Disp: , Rfl:    losartan  (COZAAR ) 25 MG tablet, Take 25 mg by mouth daily., Disp: , Rfl:    metoprolol  succinate (TOPROL -XL) 25 MG 24 hr tablet, Take 12.5 mg by mouth daily., Disp: , Rfl:    pantoprazole  (PROTONIX ) 20 MG tablet, Take 1 tablet (20 mg total) by mouth daily., Disp: 30 tablet, Rfl: 1   timolol  (TIMOPTIC ) 0.5 % ophthalmic solution, Apply 1 drop to eye.  Place 1 drop into both eyes once daily, Disp: , Rfl:    vitamin B-12 1000 MCG tablet, Take 1 tablet (1,000 mcg total) by mouth daily., Disp: 30 tablet, Rfl: 0   acetaminophen  (TYLENOL ) 500 MG tablet, Take 1,000 mg by mouth every 8 (eight) hours as needed for mild pain or moderate pain., Disp: , Rfl:    celecoxib  (CELEBREX ) 100 MG capsule, Take 100 mg by mouth daily. (Patient not taking: Reported on 02/18/2024), Disp: , Rfl:    Cholecalciferol  (VITAMIN D ) 50 MCG (2000 UT) tablet, Take 2,000 Units by mouth daily., Disp: , Rfl:    ferrous sulfate  325 (  65 FE) MG tablet, Take 1 tablet (325 mg total) by mouth daily., Disp: 30 tablet, Rfl: 0   latanoprost  (XALATAN ) 0.005 % ophthalmic solution, SMARTSIG:In Eye(s), Disp: , Rfl:    lisinopril  (ZESTRIL ) 20 MG tablet, Take 1 tablet by mouth 2 (two) times daily. (Patient not taking: Reported on 06/01/2022), Disp: , Rfl:    pravastatin  (PRAVACHOL ) 40 MG tablet, Take 40 mg by mouth at bedtime. (Patient not taking: Reported on 06/01/2022), Disp: , Rfl:    tamsulosin  (FLOMAX ) 0.4 MG CAPS capsule, Take 1 capsule (0.4 mg total) by mouth daily after supper. (Patient not taking: Reported on 06/01/2022), Disp: 30 capsule, Rfl:   Labs and Diagnostic Imaging   CBC:  Recent Labs  Lab 02/18/24 1546 02/18/24 1756  WBC 16.3* 16.0*  NEUTROABS  --  14.1*  HGB 14.9 14.4  HCT  45.8 42.6  MCV 91.2 88.6  PLT 329 324    Basic Metabolic Panel:  Lab Results  Component Value Date   NA 134 (L) 02/18/2024   K 4.3 02/18/2024   CO2 20 (L) 02/18/2024   GLUCOSE 118 (H) 02/18/2024   BUN 13 02/18/2024   CREATININE 0.95 02/18/2024   CALCIUM  10.3 02/18/2024   GFRNONAA 57 (L) 02/18/2024   GFRAA >60 05/04/2020   Lipid Panel:  Lab Results  Component Value Date   LDLCALC 74 02/19/2024   HgbA1c:  Lab Results  Component Value Date   HGBA1C 5.0 06/13/2021   Urine Drug Screen:     Component Value Date/Time   LABOPIA NONE DETECTED 06/12/2021 1825   COCAINSCRNUR NONE DETECTED 06/12/2021 1825   LABBENZ NONE DETECTED 06/12/2021 1825   AMPHETMU NONE DETECTED 06/12/2021 1825   THCU NONE DETECTED 06/12/2021 1825   LABBARB NONE DETECTED 06/12/2021 1825    Alcohol Level     Component Value Date/Time   ETH <15 02/19/2024 0145   INR  Lab Results  Component Value Date   INR 1.0 02/18/2024   APTT  Lab Results  Component Value Date   APTT 30 02/18/2024   CT Head without contrast(Personally reviewed): No acute findings  CT angio Head and Neck with contrast(Personally reviewed): No ELVO  MRI Brain(Personally reviewed): Punctate focus of diffusion signal abnormality in the high left frontal lobe suspicious for acute to early subacute ischemic infarct.  No other acute intracranial abnormality.  Age-related cerebral atrophy with moderate chronic small vessel ischemic disease with few scattered remote lacunar infarctions involving the right frontal corona radiata, thalami and cerebellum.  rEEG:  Pending  Assessment   Kellie Harris is a 89 y.o. female past history of stereotypic episodes of aphasia that has been worked up for stroke/TIA versus seizures, 1 MRI in the past positive for a small right frontal infarct, presenting for episode of aphasia-more expressive aphasia than receptive aphasia.  She continues to be aphasic at this time. Her MRI done overnight shows a  punctate focus of diffusion abnormality concerning for a acute to early subacute ischemic infarct in the high left frontal lobe-that small punctate infarct, especially subacute looking is less likely to have been the cause of her aphasia of sudden onset yesterday at the ER. Her main complaint at presentation was shortness of breath and dizziness at the time. Given the presence of stroke on MRI, we will complete the full stroke workup but that does not completely explain her aphasia. I still suspect that there is some other pathology-may be underlying seizure activity that is causing her to be aphasic. She was  loaded with Keppra  and started on Keppra  500 twice daily. According to family, last time that she had this episode it took her about 36 hours to come back around. I think we should at this point definitely consider a long-term EEG to see if we can capture and characterize the spells.   Impression: Episodic aphasia not explained by minor strokes seen on MRI on 2 occasions, concern for underlying seizures versus speech arrest/behavioral changes associated with dementia  Recommendations   For the stereotypic episodes of aphasia/speech arrest-concern for seizure: Going forward, she needs LTM EEG.  For that reason, she should be transferred to [facility].  Discussed with the daughter over the phone who is agreeable.  Discussed with the hospitalist who is going to arrange for transfer to [facility] for LTM. For now continue Keppra  500 twice daily. Neurology to follow at [facility].  Stroke workup Given that she did have a subacute looking punctate infarct, full stroke workup is still something that I would complete. Her LDL is near goal at 74.  Goal is less than 70.  Continue home statin atorvastatin  40. Her A1c is at goal-5.0.  Goal is less than 7. Echocardiogram is pending Continue aspirin  Continue therapy assessments Neurology to continue to follow  Plan  discussed with Ms. Tully Shambley-daughter-over the phone. Plan discussed with Dr. Dezii in person  Jolynn Pack neurohospitalist/epilepsy service to follow when in [facility].  Dr. Jerrie notified  ______________________________________________________________________   Signed, Eligio Lav, MD [facility]

## 2024-02-19 NOTE — ED Notes (Signed)
 EMTALA reviewed by this RN.

## 2024-02-19 NOTE — Discharge Summary (Signed)
 Physician Discharge Summary  Lova Urbieta Hensler FMW:[id number] DOB: 1931-03-30 DOA: 02/18/2024  PCP: [name], MD  Admit date: 02/18/2024 Discharge date: 02/19/2024 --> [facility]   Recommendations for Outpatient Follow-up:  DC directly to [facility] for continued work up   Hospital Course: [facility] is a 89 year old female with degenerative joint disease, arthritis, hypertension, hypercholesterolemia.  Ms. Kochanski was at her outpatient [facility] orthopedic clinic when she was noted to be dizzy and short of breath and was recommended to come to the ED.  Not long after arrival she became acutely aphasic.  Neurology was consulted and patient was admitted for workup. Patient has had multiple episodes similar to this in the past which have been negative for stroke/TIA workup.  1 prior MRI did reveal a small right frontal infarct.  MRI this admission shows a punctate focus of diffusion abnormality concerning for acute to early subacute ischemic infarct in the left frontal lobe.  Per neurology review this is unlikely to be causing symptoms.  Given consistency of symptoms in the past there is concern for underlying seizure activity.  She was loaded with Keppra  and started on Keppra  500 mg twice daily.  Neurology has recommended long-term EEG to better capture and characterize these spells.  She is being transferred to [facility] to complete this workup. By my evaluation on 7/9 patient is alert and oriented x 4.  No appreciated aphasia, mild word finding difficulty.  The care plan was discussed directly with the patient as well as with her daughter.  Stereotypic episodes of aphasia/speech arrest - Cannot rule out dementia associated speech arrest versus seizure activity.  Multiple similar episodes in the past. -- s/p Keppra  load and now of 500mg  BID - Transfer to [facility] for LTM EEG - Neurology, Dr. Jerrie aware  Stroke work up: -MRI with subacute looking punctate infarct, unlikely to explain aphasia but stroke workup  as above - LDL 74, continue home dose atorvastatin  - Hemoglobin A1c pending - Echocardiogram: Pending - Continue aspirin  - PT/OT.  SLP has cleared for full diet - Neurology to follow as above  Leukocytosis - Likely reactive - CXR unremarkable - UA pending - Repeat CBC on arrival to [facility].  No fever.  No signs or symptoms  Arthritis - Continue home meds  Hypertension - Continue home meds  Hypercholesterolemia - Continue home meds Discharge Instructions  Discharge Instructions     Call MD for:  difficulty breathing, headache or visual disturbances   Complete by: As directed    Call MD for:  persistant dizziness or light-headedness   Complete by: As directed    Call MD for:  persistant nausea and vomiting   Complete by: As directed    Call MD for:  severe uncontrolled pain   Complete by: As directed    Call MD for:  temperature >100.4   Complete by: As directed    Diet general   Complete by: As directed       Allergies as of 02/19/2024   No Known Allergies      Medication List     STOP taking these medications    acetaminophen  500 MG tablet Commonly known as: TYLENOL    celecoxib  100 MG capsule Commonly known as: CELEBREX    ferrous sulfate  325 (65 FE) MG tablet   lisinopril  20 MG tablet Commonly known as: ZESTRIL    pravastatin  40 MG tablet Commonly known as: PRAVACHOL    tamsulosin  0.4 MG Caps capsule Commonly known as: FLOMAX   TAKE these medications    amLODipine  10 MG tablet Commonly known as: NORVASC  Take 10 mg by mouth daily.   aspirin  81 MG chewable tablet Chew 1 tablet (81 mg total) by mouth 2 (two) times daily.   atorvastatin  40 MG tablet Commonly known as: LIPITOR Take 40 mg by mouth.  Take 1 tablet (40 mg total) by mouth once daily   cyanocobalamin  1000 MCG tablet Take 1 tablet (1,000 mcg total) by mouth daily.   latanoprost  0.005 % ophthalmic solution Commonly known as: XALATAN  SMARTSIG:In Eye(s)   levETIRAcetam  500 MG  tablet Commonly known as: KEPPRA  Take 1 tablet (500 mg total) by mouth 2 (two) times daily.   losartan  25 MG tablet Commonly known as: COZAAR  Take 25 mg by mouth daily.   metoprolol  succinate 25 MG 24 hr tablet Commonly known as: TOPROL -XL Take 12.5 mg by mouth daily.   pantoprazole  20 MG tablet Commonly known as: Protonix  Take 1 tablet (20 mg total) by mouth daily.   timolol  0.5 % ophthalmic solution Commonly known as: TIMOPTIC  Apply 1 drop to eye.  Place 1 drop into both eyes once daily   Vitamin D  50 MCG (2000 UT) tablet Take 2,000 Units by mouth daily.        No Known Allergies  Consultations:    Procedures/Studies: MR BRAIN WO CONTRAST Result Date: 02/19/2024 CLINICAL DATA:  Initial evaluation for acute neuro deficit, stroke suspected. EXAM: MRI HEAD WITHOUT CONTRAST TECHNIQUE: Multiplanar, multiecho pulse sequences of the brain and surrounding structures were obtained without intravenous contrast. COMPARISON:  CTs from earlier the same day. FINDINGS: Brain: Examination degraded by motion artifact. Generalized age-related cerebral atrophy. Patchy T2/FLAIR hyperintensity involving the periventricular and deep white matter, consistent with chronic small vessel ischemic disease, moderately advanced in nature. Few scatter remote lacunar infarcts present about the right frontal corona radiata and thalami. Small remote cerebellar infarcts, right greater than left. w single punctate focus of mild diffusion signal abnormality seen involving the high left frontal lobe (series 5, image 46), suspicious for a small acute to early subacute ischemic infarct. Initial this is not well seen on corresponding coronal DWI sequence. No associated hemorrhage. No other evidence for acute or subacute ischemia. Gray-white matter differentiation otherwise maintained. No acute intracranial hemorrhage. Few punctate chronic micro hemorrhages noted within the right cerebellum and left thalamus. No mass  lesion, midline shift or mass effect. Mild ventricular prominence related global parenchymal volume loss without hydrocephalus. No extra-axial fluid collection. Pituitary gland within normal limits. Vascular: Major intracranial vascular flow voids are maintained. Skull and upper cervical spine: Craniocervical junction within normal limits. Bone marrow signal intensity within normal limits. No scalp soft tissue abnormality. Sinuses/Orbits: Globes orbital soft tissues demonstrate no acute finding. Prior bilateral ocular lens replacement. Paranasal sinuses are largely clear. No significant mastoid effusion. Other: None. IMPRESSION: 1. Single punctate focus of diffusion signal abnormality involving the high left frontal lobe, suspicious for a small acute to early subacute ischemic infarct. No associated hemorrhage. 2. No other acute intracranial abnormality. 3. Age-related cerebral atrophy with moderate chronic small vessel ischemic disease, with a few scattered remote lacunar infarcts involving the right frontal corona radiata, thalami, and cerebellum. Electronically Signed   By: Morene Hoard M.D.   On: 02/19/2024 00:26   DG Chest Port 1 View Result Date: 02/18/2024 CLINICAL DATA:  Altered mental status, leukocytosis EXAM: PORTABLE CHEST 1 VIEW COMPARISON:  09/07/2021 FINDINGS: Heart and mediastinal contours are within normal limits. No focal opacities or effusions. No acute  bony abnormality. Aortic atherosclerosis. IMPRESSION: No active disease. Electronically Signed   By: Franky Crease M.D.   On: 02/18/2024 20:38   CT ANGIO HEAD NECK W WO CM (CODE STROKE) Result Date: 02/18/2024 CLINICAL DATA:  Provided history: Stroke, follow-up. EXAM: CT ANGIOGRAPHY HEAD AND NECK WITH AND WITHOUT CONTRAST TECHNIQUE: Multidetector CT imaging of the head and neck was performed using the standard protocol during bolus administration of intravenous contrast. Multiplanar CT image reconstructions and MIPs were obtained to  evaluate the vascular anatomy. Carotid stenosis measurements (when applicable) are obtained utilizing NASCET criteria, using the distal internal carotid diameter as the denominator. RADIATION DOSE REDUCTION: This exam was performed according to the departmental dose-optimization program which includes automated exposure control, adjustment of the mA and/or kV according to patient size and/or use of iterative reconstruction technique. CONTRAST:  75mL OMNIPAQUE  IOHEXOL  350 MG/ML SOLN COMPARISON:  Noncontrast head CT performed earlier today 02/18/2024. CT angiogram head/neck 04/03/2018. FINDINGS: CTA NECK FINDINGS Aortic arch: Standard aortic branching. Atherosclerotic plaque within the visible thoracic aorta and proximal major branch vessels of the neck. Atherosclerotic plaque within the visible thoracic aorta and proximal major branch vessels of the neck. Streak/beam hardening artifact arising from a dense contrast bolus partially obscures the right subclavian artery. Within this limitation, there is no appreciable hemodynamically significant innominate or proximal subclavian artery stenosis. Right carotid system: CCA and ICA patent within the neck without stenosis. Mild atherosclerotic plaque about the carotid bifurcation. Left carotid system: CCA and ICA patent within the neck without measurable stenosis. Nonstenotic atherosclerotic plaque at the CCA origin, about the carotid bifurcation and within the proximal ICA. Vertebral arteries: Patent within the neck. The right vertebral artery is slightly dominant. Nonstenotic calcified plaque at the right vertebral artery origin and within the right V3 segment. Nonstenotic calcified plaque within the proximal left V3 segment. Atherosclerotic plaque within the left vertebral artery at the V3/V4 junction which has progressed and now results in at least moderate stenosis. Skeleton: Nonspecific reversal of the expected cervical doses. Grade 1 anterolisthesis at C2-C3.  Cervical spondylosis and multilevel vertebral ankylosis. No acute fracture or aggressive osseous lesion. Other neck: No neck mass or cervical lymphadenopathy. Upper chest: No consolidation within the imaged lung apices. Review of the MIP images confirms the above findings CTA HEAD FINDINGS Anterior circulation: The intracranial internal carotid arteries are patent. Nonstenotic calcified plaque within both vessels The M1 middle cerebral arteries are patent. No M2 proximal branch occlusion or high-grade proximal stenosis. The anterior cerebral arteries are patent. New sites of up to moderate stenosis within the left anterior cerebral artery A4 segment. Unchanged 3 mm ventrally projecting vascular protrusion arising from the right middle cerebral artery distal M1 segment, which may reflect an aneurysm or infundibulum. Posterior circulation: The intracranial vertebral arteries are patent. Nonstenotic atherosclerotic plaque within the intracranial right vertebral atherosclerotic plaque within the intracranial right vertebral artery with no more than mild stenosis. Progressive atherosclerotic plaque within the left vertebral artery at the V3/V4 junction, now with at least moderate stenosis. The basilar artery is patent. The posterior cerebral arteries are patent. Moderate stenosis within the right PCA P2 segment, new from the prior exam. Moderate to severe stenosis within the left PCA at the P2/P3 junction, also new from the prior exam. Posterior communicating arteries are diminutive or absent, bilaterally. Venous sinuses: Contrast timing precludes evaluation for dural venous sinus thrombosis. Anatomic variants: As described. Review of the MIP images confirms the above findings No emergent large vessel occlusion identified. These results  were communicated to Dr. Arora at 7:34 pmon 7/8/2025by text page via the [facility] messaging system. IMPRESSION: CTA neck: 1. The common carotid and internal carotid arteries are patent  within the neck without stenosis. Mild atherosclerotic plaque bilaterally, as described. 2. The vertebral arteries are patent within the neck. Atherosclerotic plaque within the left vertebral artery at the V3/V4 junction, progressed from the prior CTA of 04/03/2018 and now resulting in at least moderate stenosis. Non-stenotic atherosclerotic plaque elsewhere within bilateral cervical vertebral arteries. 3. Aortic Atherosclerosis (ICD10-I70.0). CTA head: 1. No proximal intracranial large vessel occlusion identified. 2. Intracranial atherosclerotic disease with multifocal stenoses, most notably as follows. 3. Atherosclerotic plaque within the left vertebral artery at the V3/V4 junction, progressed from the prior CTA of 04/03/2018 and now resulting in at least moderate stenosis. 4. New moderate-to-severe stenosis within the left posterior cerebral artery at the P3/P4 junction. 5. New moderate stenosis within the right PCA P2 segment. 6. New sites of up to moderate stenosis within the left anterior cerebral artery A4 segment. 7. Unchanged 3 mm vascular protrusion arising from the right middle cerebral artery distal M1 segment, which may reflect an aneurysm or infundibulum. Electronically Signed   By: Rockey Childs D.O.   On: 02/18/2024 19:34   CT HEAD CODE STROKE WO CONTRAST Result Date: 02/18/2024 CLINICAL DATA:  Code stroke. Neuro deficit, acute, stroke suspected. Shortness of breath. Dizziness. EXAM: CT HEAD WITHOUT CONTRAST TECHNIQUE: Contiguous axial images were obtained from the base of the skull through the vertex without intravenous contrast. RADIATION DOSE REDUCTION: This exam was performed according to the departmental dose-optimization program which includes automated exposure control, adjustment of the mA and/or kV according to patient size and/or use of iterative reconstruction technique. COMPARISON:  Head CT 02/06/2024. Brain MRI 06/01/2022. FINDINGS: Brain: Generalized cerebral atrophy. Known chronic  infarcts within the right corona radiata, thalami, left occipital lobe and bilateral cerebellar hemispheres, some of which were better appreciated on the prior brain MRI of 06/01/2022. Background advanced patchy and ill-defined hypoattenuation within the cerebral white matter, nonspecific but compatible chronic small vessel fall ischemic disease. There is no acute intracranial hemorrhage. No acute demarcated cortical infarct. No extra-axial fluid collection. No evidence of an intracranial mass. No midline shift. Vascular: No hyperdense vessel. Atherosclerotic calcifications. Skull: No calvarial fracture or aggressive osseous lesion. Sinuses/Orbits: No mass or acute finding within the imaged orbits. No significant paranasal sinus disease at the imaged levels. ASPECTS [facility] Stroke Program Early CT Score) - Ganglionic level infarction (caudate, lentiform nuclei, internal capsule, insula, M1-M3 cortex): 7 - Supraganglionic infarction (M4-M6 cortex): 3 Total score (0-10 with 10 being normal): 10 (when discounting chronic infarcts). No evidence of an acute intracranial abnormality. These results were called by telephone at the time of interpretation on 02/18/2024 at 6:30 pm to provider DAVID [facility] , who verbally acknowledged these results. IMPRESSION: 1. No evidence of an acute intracranial abnormality. 2. Parenchymal atrophy, chronic small vessel ischemic disease and unchanged chronic infarcts, as described. Electronically Signed   By: Rockey Childs D.O.   On: 02/18/2024 18:31   CT HEAD CODE STROKE WO CONTRAST Result Date: 02/06/2024 CLINICAL DATA:  Code stroke.  Neuro deficit, concern for stroke. EXAM: CT HEAD WITHOUT CONTRAST TECHNIQUE: Contiguous axial images were obtained from the base of the skull through the vertex without intravenous contrast. RADIATION DOSE REDUCTION: This exam was performed according to the departmental dose-optimization program which includes automated exposure control, adjustment of the mA  and/or kV according to patient size and/or use  of iterative reconstruction technique. COMPARISON:  MRI head and CT head 06/01/2022. FINDINGS: Brain: No acute intracranial hemorrhage. No CT evidence of completed large territory infarct. Remote lacunar infarct in the bilateral thalami more pronounced on the right. Additional remote infarct in the right cerebellum and left occipital lobe. Nonspecific hypoattenuation in the periventricular and subcortical white matter favored to reflect chronic microvascular ischemic changes. Mild parenchymal volume loss. Ventricles: The ventricles are normal. Vascular: Atherosclerotic calcifications of the carotid siphons and intracranial vertebral arteries. No hyperdense vessel. Skull: No acute or aggressive finding. Orbits: Orbits are symmetric. Sinuses: The visualized paranasal sinuses are clear. Other: Mastoid air cells are clear. ASPECTS [facility] Stroke Program Early CT Score) - Ganglionic level infarction (caudate, lentiform nuclei, internal capsule, insula, M1-M3 cortex): 7 - Supraganglionic infarction (M4-M6 cortex): 3 Total score (0-10 with 10 being normal): 10 IMPRESSION: 1. No CT evidence of acute intracranial abnormality. 2. Extensive chronic microvascular ischemic changes and moderate parenchymal volume loss. 3. 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 4. ASPECTS is 10 These results were communicated to Dr. Lindzen at 4:58 pm on 02/06/2024 by text page via the [facility] messaging system. Electronically Signed   By: Donnice Mania M.D.   On: 02/06/2024 16:58      Discharge Exam: Vitals:   02/19/24 0947 02/19/24 1219  BP:  (!) 148/91  Pulse:  89  Resp:  17  Temp: 97.9 F (36.6 C) 97.7 F (36.5 C)  SpO2:  97%   Vitals:   02/19/24 0430 02/19/24 0802 02/19/24 0947 02/19/24 1219  BP: (!) 161/80 (!) 143/92  (!) 148/91  Pulse: 79 62  89  Resp: 17 18  17   Temp:   97.9 F (36.6 C) 97.7 F (36.5 C)  TempSrc:    Oral   SpO2: 96% 100%  97%  Weight:      Height:        Constitutional:  Normal appearance. Non toxic-appearing.  HENT: Head Normocephalic and atraumatic.  Mucous membranes are moist.  Eyes:  Extraocular intact. Conjunctivae normal.  Cardiovascular: Rate and Rhythm: Normal rate and regular rhythm.  Pulmonary: Non labored, symmetric rise of chest wall.  Skin: warm and [place]. not jaundiced.  Neurological: No focal deficit present. alert. Oriented.  Psychiatric: Mood and Affect congruent.    The results of significant diagnostics from this hospitalization (including imaging, microbiology, ancillary and laboratory) are listed below for reference.     Microbiology: No results found for this or any previous visit (from the past 240 hours).   Labs: BNP (last 3 results) No results for input(s): BNP in the last 8760 hours. Basic Metabolic Panel: Recent Labs  Lab 02/18/24 1546  NA 134*  K 4.3  CL 97*  CO2 20*  GLUCOSE 118*  BUN 13  CREATININE 0.95  CALCIUM  10.3   Liver Function Tests: Recent Labs  Lab 02/18/24 1546  AST 26  ALT 13  ALKPHOS 68  BILITOT 1.4*  PROT 8.2*  ALBUMIN 5.1*   No results for input(s): LIPASE, AMYLASE in the last 168 hours. No results for input(s): AMMONIA in the last 168 hours. CBC: Recent Labs  Lab 02/18/24 1546 02/18/24 1756  WBC 16.3* 16.0*  NEUTROABS  --  14.1*  HGB 14.9 14.4  HCT 45.8 42.6  MCV 91.2 88.6  PLT 329 324   Cardiac Enzymes: No results for input(s): CKTOTAL, CKMB, CKMBINDEX, TROPONINI in the last 168 hours. BNP: Invalid input(s): POCBNP CBG: Recent Labs  Lab 02/18/24  1743  GLUCAP 121*   D-Dimer No results for input(s): DDIMER in the last 72 hours. Hgb A1c No results for input(s): HGBA1C in the last 72 hours. Lipid Profile Recent Labs    02/19/24 0443  CHOL 143  HDL 49  LDLCALC 74  TRIG 102  CHOLHDL 2.9   Thyroid  function studies No results for input(s): TSH, T4TOTAL, T3FREE,  THYROIDAB in the last 72 hours.  Invalid input(s): FREET3 Anemia work up No results for input(s): VITAMINB12, FOLATE, FERRITIN, TIBC, IRON, RETICCTPCT in the last 72 hours. Urinalysis    Component Value Date/Time   COLORURINE YELLOW 09/07/2021 1243   APPEARANCEUR CLEAR 09/07/2021 1243   APPEARANCEUR Clear 07/08/2014 2155   LABSPEC 1.020 09/07/2021 1243   LABSPEC 1.003 07/08/2014 2155   PHURINE 7.0 09/07/2021 1243   GLUCOSEU NEGATIVE 09/07/2021 1243   GLUCOSEU Negative 07/08/2014 2155   HGBUR TRACE (A) 09/07/2021 1243   BILIRUBINUR NEGATIVE 09/07/2021 1243   BILIRUBINUR Negative 07/08/2014 2155   KETONESUR 15 (A) 09/07/2021 1243   PROTEINUR 100 (A) 09/07/2021 1243   NITRITE NEGATIVE 09/07/2021 1243   LEUKOCYTESUR NEGATIVE 09/07/2021 1243   LEUKOCYTESUR Negative 07/08/2014 2155   Sepsis Labs Recent Labs  Lab 02/18/24 1546 02/18/24 1756  WBC 16.3* 16.0*   Microbiology No results found for this or any previous visit (from the past 240 hours).   Time coordinating discharge: 32 min   SIGNED: Lorane Poland, MD  [facility] 02/19/2024, 12:53 PM Pager   If 7PM-7AM, please contact night-coverage

## 2024-02-19 NOTE — Evaluation (Signed)
 Physical Therapy Evaluation Patient Details Name: Kellie Harris MRN: [id number] DOB: [birth year] Today's Date: 02/19/2024  History of Present Illness  [name] is a 89 year old female with history degenerative joint disease, arthritis, hypertension, hypercholesteremia, who presents for chief concern of acute hypoxia at outpatient [facility] orthopedics clinic. 1 MRI in the past positive for a small right frontal infarct, presenting for episode of aphasia-more expressive aphasia than receptive aphasia.  She continues to be aphasic at this time. MRI done shows a punctate focus of diffusion abnormality concerning for a acute to early subacute ischemic infarct in the high left frontal lobe-that small punctate infarct, especially subacute looking is less likely to have been the cause of her aphasia of sudden onset yesterday at the ER.  Clinical Impression  Patient received in bed in ED. She is very HOH and confused at this time. Oriented to self only. No family present for evaluation. Patient is mod I for bed mobility. Transfers with cga. She ambulated ~60 feet with hand held A. Mild unsteadiness. Would benefit from AD. Patient will require 24/7 A at this time due to confusion. She will continue to benefit from skilled PT to improve independence and safety with mobility.         If plan is discharge home, recommend the following: A little help with walking and/or transfers;A little help with bathing/dressing/bathroom;Supervision due to cognitive status;Assistance with cooking/housework;Direct supervision/assist for medications management;Direct supervision/assist for financial management;Help with stairs or ramp for entrance;Assist for transportation   Can travel by private vehicle   Yes    Equipment Recommendations Rolling walker (2 wheels)  Recommendations for Other Services       Functional Status Assessment Patient has had a recent decline in their functional status and demonstrates the ability to make  significant improvements in function in a reasonable and predictable amount of time.     Precautions / Restrictions Precautions Precautions: Fall Recall of Precautions/Restrictions: Impaired Restrictions Weight Bearing Restrictions Per Provider Order: No      Mobility  Bed Mobility Overal bed mobility: Modified Independent                  Transfers Overall transfer level: Needs assistance Equipment used: 1 person hand held assist Transfers: Sit to/from Stand Sit to Stand: Min assist                Ambulation/Gait Ambulation/Gait assistance: Contact guard assist Gait Distance (Feet): 60 Feet Assistive device: 1 person hand held assist Gait Pattern/deviations: Step-through pattern, Narrow base of support, Drifts right/left Gait velocity: decr     General Gait Details: patient slightly unsteady with ambulation out in hallway.  Stairs            Wheelchair Mobility     Tilt Bed    Modified Rankin (Stroke Patients Only)       Balance Overall balance assessment: Needs assistance Sitting-balance support: Feet supported Sitting balance-Leahy Scale: Good     Standing balance support: Single extremity supported, During functional activity Standing balance-Leahy Scale: Fair                               Pertinent Vitals/Pain Pain Assessment Pain Assessment: No/denies pain    Home Living Family/patient expects to be discharged to:: Private residence Living Arrangements: Alone Available Help at Discharge: Family;Available PRN/intermittently               Additional Comments: no family  present and patient is confused.    Prior Function               Mobility Comments: apparently lives alone and is not confused at baseline per ED notes.       Extremity/Trunk Assessment   Upper Extremity Assessment Upper Extremity Assessment: Defer to OT evaluation    Lower Extremity Assessment Lower Extremity Assessment:  Generalized weakness    Cervical / Trunk Assessment Cervical / Trunk Assessment: Normal  Communication   Communication Communication: Impaired Factors Affecting Communication: Hearing impaired    Cognition Arousal: Alert Behavior During Therapy: WFL for tasks assessed/performed   PT - Cognitive impairments: Awareness, Problem solving, Orientation   Orientation impairments: Place, Time, Situation                     Following commands: Impaired Following commands impaired: Follows one step commands inconsistently     Cueing Cueing Techniques: Verbal cues, Gestural cues     General Comments      Exercises     Assessment/Plan    PT Assessment Patient needs continued PT services  PT Problem List Decreased strength;Decreased activity tolerance;Decreased balance;Decreased mobility;Decreased safety awareness;Decreased knowledge of use of DME;Decreased cognition       PT Treatment Interventions DME instruction;Gait training;Stair training;Functional mobility training;Therapeutic activities;Therapeutic exercise;Balance training;Cognitive remediation;Patient/family education    PT Goals (Current goals can be found in the Care Plan section)  Acute Rehab PT Goals Patient Stated Goal: none stated PT Goal Formulation: Patient unable to participate in goal setting Time For Goal Achievement: 03/04/24    Frequency Min 3X/week     Co-evaluation PT/OT/SLP Co-Evaluation/Treatment: Yes Reason for Co-Treatment: Necessary to address cognition/behavior during functional activity;For patient/therapist safety;To address functional/ADL transfers PT goals addressed during session: Mobility/safety with mobility;Balance         AM-PAC PT 6 Clicks Mobility  Outcome Measure Help needed turning from your back to your side while in a flat bed without using bedrails?: A Little Help needed moving from lying on your back to sitting on the side of a flat bed without using bedrails?:  A Little Help needed moving to and from a bed to a chair (including a wheelchair)?: A Little Help needed standing up from a chair using your arms (e.g., wheelchair or bedside chair)?: A Little Help needed to walk in hospital room?: A Little Help needed climbing 3-5 steps with a railing? : A Little 6 Click Score: 18    End of Session   Activity Tolerance: Patient tolerated treatment well Patient left: in bed;with call bell/phone within reach;with bed alarm set Nurse Communication: Mobility status PT Visit Diagnosis: Unsteadiness on feet (R26.81);Other abnormalities of gait and mobility (R26.89);Muscle weakness (generalized) (M62.81);Difficulty in walking, not elsewhere classified (R26.2)    Time: 1021-1030 PT Time Calculation (min) (ACUTE ONLY): 9 min   Charges:   PT Evaluation $PT Eval Moderate Complexity: 1 Mod   PT General Charges $$ ACUTE PT VISIT: 1 Visit         Quandarius Nill, PT, GCS 02/19/24,11:09 AM

## 2024-02-20 ENCOUNTER — Encounter (HOSPITAL_COMMUNITY)

## 2024-02-20 ENCOUNTER — Observation Stay (HOSPITAL_COMMUNITY)

## 2024-02-20 ENCOUNTER — Inpatient Hospital Stay (HOSPITAL_COMMUNITY)

## 2024-02-20 DIAGNOSIS — R4701 Aphasia: Secondary | ICD-10-CM | POA: Diagnosis not present

## 2024-02-20 DIAGNOSIS — I6389 Other cerebral infarction: Secondary | ICD-10-CM | POA: Diagnosis not present

## 2024-02-20 DIAGNOSIS — I639 Cerebral infarction, unspecified: Secondary | ICD-10-CM | POA: Diagnosis not present

## 2024-02-20 DIAGNOSIS — G459 Transient cerebral ischemic attack, unspecified: Secondary | ICD-10-CM

## 2024-02-20 DIAGNOSIS — F0390 Unspecified dementia without behavioral disturbance: Secondary | ICD-10-CM | POA: Diagnosis not present

## 2024-02-20 DIAGNOSIS — R41841 Cognitive communication deficit: Secondary | ICD-10-CM | POA: Diagnosis not present

## 2024-02-20 DIAGNOSIS — Z96642 Presence of left artificial hip joint: Secondary | ICD-10-CM | POA: Diagnosis not present

## 2024-02-20 DIAGNOSIS — Z96651 Presence of right artificial knee joint: Secondary | ICD-10-CM | POA: Diagnosis not present

## 2024-02-20 DIAGNOSIS — I669 Occlusion and stenosis of unspecified cerebral artery: Secondary | ICD-10-CM

## 2024-02-20 DIAGNOSIS — E785 Hyperlipidemia, unspecified: Secondary | ICD-10-CM | POA: Diagnosis not present

## 2024-02-20 DIAGNOSIS — Z823 Family history of stroke: Secondary | ICD-10-CM | POA: Diagnosis not present

## 2024-02-20 DIAGNOSIS — R569 Unspecified convulsions: Secondary | ICD-10-CM | POA: Diagnosis not present

## 2024-02-20 DIAGNOSIS — I959 Hypotension, unspecified: Secondary | ICD-10-CM

## 2024-02-20 DIAGNOSIS — K588 Other irritable bowel syndrome: Secondary | ICD-10-CM | POA: Diagnosis not present

## 2024-02-20 DIAGNOSIS — Z8673 Personal history of transient ischemic attack (TIA), and cerebral infarction without residual deficits: Secondary | ICD-10-CM | POA: Diagnosis not present

## 2024-02-20 DIAGNOSIS — Z79899 Other long term (current) drug therapy: Secondary | ICD-10-CM | POA: Diagnosis not present

## 2024-02-20 DIAGNOSIS — R52 Pain, unspecified: Secondary | ICD-10-CM | POA: Diagnosis not present

## 2024-02-20 DIAGNOSIS — Z803 Family history of malignant neoplasm of breast: Secondary | ICD-10-CM | POA: Diagnosis not present

## 2024-02-20 DIAGNOSIS — Z66 Do not resuscitate: Secondary | ICD-10-CM | POA: Diagnosis not present

## 2024-02-20 DIAGNOSIS — E7800 Pure hypercholesterolemia, unspecified: Secondary | ICD-10-CM | POA: Diagnosis not present

## 2024-02-20 DIAGNOSIS — E569 Vitamin deficiency, unspecified: Secondary | ICD-10-CM | POA: Diagnosis not present

## 2024-02-20 DIAGNOSIS — M6259 Muscle wasting and atrophy, not elsewhere classified, multiple sites: Secondary | ICD-10-CM | POA: Diagnosis not present

## 2024-02-20 DIAGNOSIS — I2510 Atherosclerotic heart disease of native coronary artery without angina pectoris: Secondary | ICD-10-CM | POA: Diagnosis not present

## 2024-02-20 DIAGNOSIS — K219 Gastro-esophageal reflux disease without esophagitis: Secondary | ICD-10-CM | POA: Diagnosis not present

## 2024-02-20 DIAGNOSIS — M8580 Other specified disorders of bone density and structure, unspecified site: Secondary | ICD-10-CM | POA: Diagnosis not present

## 2024-02-20 DIAGNOSIS — I10 Essential (primary) hypertension: Secondary | ICD-10-CM | POA: Diagnosis not present

## 2024-02-20 DIAGNOSIS — I6612 Occlusion and stenosis of left anterior cerebral artery: Secondary | ICD-10-CM

## 2024-02-20 DIAGNOSIS — D72829 Elevated white blood cell count, unspecified: Secondary | ICD-10-CM | POA: Diagnosis not present

## 2024-02-20 DIAGNOSIS — Z806 Family history of leukemia: Secondary | ICD-10-CM | POA: Diagnosis not present

## 2024-02-20 DIAGNOSIS — M1990 Unspecified osteoarthritis, unspecified site: Secondary | ICD-10-CM | POA: Diagnosis not present

## 2024-02-20 DIAGNOSIS — I672 Cerebral atherosclerosis: Secondary | ICD-10-CM | POA: Diagnosis not present

## 2024-02-20 DIAGNOSIS — E876 Hypokalemia: Secondary | ICD-10-CM | POA: Diagnosis not present

## 2024-02-20 DIAGNOSIS — D509 Iron deficiency anemia, unspecified: Secondary | ICD-10-CM | POA: Diagnosis not present

## 2024-02-20 DIAGNOSIS — Z7982 Long term (current) use of aspirin: Secondary | ICD-10-CM | POA: Diagnosis not present

## 2024-02-20 LAB — BASIC METABOLIC PANEL WITH GFR
Anion gap: 12 (ref 5–15)
BUN: 10 mg/dL (ref 8–23)
CO2: 24 mmol/L (ref 22–32)
Calcium: 9.4 mg/dL (ref 8.9–10.3)
Chloride: 102 mmol/L (ref 98–111)
Creatinine, Ser: 1.01 mg/dL — ABNORMAL HIGH (ref 0.44–1.00)
GFR, Estimated: 53 mL/min — ABNORMAL LOW (ref >60)
Glucose, Bld: 90 mg/dL (ref 70–99)
Potassium: 3.1 mmol/L — ABNORMAL LOW (ref 3.5–5.1)
Sodium: 138 mmol/L (ref 135–145)

## 2024-02-20 LAB — URINALYSIS, ROUTINE W REFLEX MICROSCOPIC
Bacteria, UA: NONE SEEN
Bilirubin Urine: NEGATIVE
Glucose, UA: NEGATIVE mg/dL
Hgb urine dipstick: NEGATIVE
Ketones, ur: NEGATIVE mg/dL
Nitrite: NEGATIVE
Protein, ur: NEGATIVE mg/dL
Specific Gravity, Urine: 1.01 (ref 1.005–1.030)
pH: 7 (ref 5.0–8.0)

## 2024-02-20 LAB — CBC
HCT: 41.3 % (ref 36.0–46.0)
Hemoglobin: 13.7 g/dL (ref 12.0–15.0)
MCH: 30.1 pg (ref 26.0–34.0)
MCHC: 33.2 g/dL (ref 30.0–36.0)
MCV: 90.8 fL (ref 80.0–100.0)
Platelets: 238 K/uL (ref 150–400)
RBC: 4.55 MIL/uL (ref 3.87–5.11)
RDW: 14.6 % (ref 11.5–15.5)
WBC: 11.8 K/uL — ABNORMAL HIGH (ref 4.0–10.5)
nRBC: 0 % (ref 0.0–0.2)

## 2024-02-20 MED ORDER — POTASSIUM CHLORIDE CRYS ER 20 MEQ PO TBCR
40.0000 meq | EXTENDED_RELEASE_TABLET | ORAL | Status: AC
  Administered 2024-02-20 (×2): 40 meq via ORAL
  Filled 2024-02-20 (×2): qty 2

## 2024-02-20 MED ORDER — LAMOTRIGINE 25 MG PO TABS
25.0000 mg | ORAL_TABLET | Freq: Every day | ORAL | Status: DC
  Administered 2024-02-20 – 2024-02-21 (×2): 25 mg via ORAL
  Filled 2024-02-20 (×2): qty 1

## 2024-02-20 MED ORDER — LAMOTRIGINE 25 MG PO TABS: 25.0000 mg | ORAL_TABLET | Freq: Two times a day (BID) | ORAL | Status: DC

## 2024-02-20 NOTE — Care Management Obs Status (Addendum)
 MEDICARE OBSERVATION STATUS NOTIFICATION   Patient Details  Name: Kellie Harris MRN: [id number] Date of Birth: June 30, 1931   Medicare Observation Status Notification Given:  Yes  Spoke with patient daughter Kellie Harris telephonically at [phone] will mail a certified letter to her.  Nehemiah Montee 02/20/2024, 2:09 PM

## 2024-02-20 NOTE — Progress Notes (Signed)
 Physical Therapy Treatment Patient Details Name: Kellie Harris MRN: [id number] DOB: 11-21-1930 Today's Date: 02/20/2024   History of Present Illness Pt is a 89 y.o. female who presented 02/18/24 with chief concern of acute hypoxia at outpatient [facility] orthopedics clinic. Then while in triage, pt has an episode of expressive aphasia. MRI showed single punctate focus of diffusion signal abnormality involving the high left frontal lobe, suspicious for a small acute to early subacute ischemic infarct. PMH: arthritis, cancer, diverticulosis, hydronephrosis, HTN, osteopenia, CVA    PT Comments  The pt was evaluated by PT at [facility] prior to transfer to [facility]. Family was present this session to provide home and PLOF information. At baseline, the pt lives alone with PRN assistance available from her family and [facility] agency. She is typically mod I utilizing her RW or rollator for functional mobility, but often chooses or forgets to use her RW or rollator and ambulates without an AD. Currently, the pt appears to be fairly symmetrical in strength in her legs with MMT scores of 4 hip flexion, 4+ knee extension, 4+ ankle dorsiflexion bilaterally. She demonstrates deficits in attention, memory, and safety awareness. Her children report the pt's cognition has been declining recently but this is worse than usual. While she is mobilizing fairly well, ambulating with a RW at a CGA-minA level, her cognitive deficits impact her safety. At this time, she would be unsafe to be home alone. Thus, continuing to recommend short-term inpatient rehab, < 3 hours/day. The pt's children report the plan is to try to find her an ALF or memory care facility for long term care. Will continue to follow acutely.     If plan is discharge home, recommend the following: A little help with walking and/or transfers;A little help with bathing/dressing/bathroom;Supervision due to cognitive status;Assistance with cooking/housework;Direct supervision/assist for  medications management;Direct supervision/assist for financial management;Help with stairs or ramp for entrance;Assist for transportation   Can travel by private vehicle     Yes  Equipment Recommendations  None recommended by PT    Recommendations for Other Services Speech consult     Precautions / Restrictions Precautions Precautions: Fall Recall of Precautions/Restrictions: Impaired Precaution/Restrictions Comments: EEG; mittens when family is not present Restrictions Weight Bearing Restrictions Per Provider Order: No     Mobility  Bed Mobility Overal bed mobility: Modified Independent             General bed mobility comments: Pt able to transition supine <> sit R EOB without assistance, HOB elevated.    Transfers Overall transfer level: Needs assistance Equipment used: Rolling walker (2 wheels) Transfers: Sit to/from Stand Sit to Stand: Contact guard assist           General transfer comment: Pt able to transfer to stand from EOB to RW 2x without LOB, CGA for safety    Ambulation/Gait Ambulation/Gait assistance: Contact guard assist, Min assist Gait Distance (Feet): 60 Feet (x2 bouts of ~60 ft > ~30 ft) Assistive device: Rolling walker (2 wheels) Gait Pattern/deviations: Step-through pattern, Decreased stride length, Trunk flexed Gait velocity: reduced Gait velocity interpretation: 1.31 - 2.62 ft/sec, indicative of limited community ambulator   General Gait Details: Pt ambulates with a slightly flexed posture, often impulsively lifting and moving her RW quickly. Poor awareness of her lines, either getting tangled in them or pulling at them despite cues. CGA for safety and balance with intermittent minA needed to redirect her as needed   Stairs  Wheelchair Mobility     Tilt Bed    Modified Rankin (Stroke Patients Only) Modified Rankin (Stroke Patients Only) Pre-Morbid Rankin Score: Slight disability Modified Rankin: Moderate  disability     Balance Overall balance assessment: Needs assistance Sitting-balance support: Feet supported, No upper extremity supported Sitting balance-Leahy Scale: Good     Standing balance support: During functional activity, Bilateral upper extremity supported, Reliant on assistive device for balance Standing balance-Leahy Scale: Poor                              Communication Communication Communication: Impaired Factors Affecting Communication: Reduced clarity of speech (per family report, but they also report it is much better than it had been; seemed fairly clear during session)  Cognition Arousal: Alert Behavior During Therapy: Impulsive, Restless   PT - Cognitive impairments: Awareness, Memory, Problem solving, Safety/Judgement                       PT - Cognition Comments: Pt asking Is mom back there in the corner?. Son asked pt to clarify if she meant the daughter, and pt replied yes. Pt restless, pulling at EEG lines at times, forgetting cues and education not to pull on them. Pt can be mildly impulsive and quick to move with poor safety awareness. She needs redirecting often Following commands: Impaired Following commands impaired: Follows one step commands with increased time, Follows multi-step commands inconsistently    Cueing Cueing Techniques: Verbal cues, Tactile cues  Exercises      General Comments        Pertinent Vitals/Pain Pain Assessment Pain Assessment: Faces Faces Pain Scale: No hurt Pain Intervention(s): Monitored during session    Home Living Family/patient expects to be discharged to:: Private residence Living Arrangements: Alone Available Help at Discharge: Family;Personal care attendant;Available PRN/intermittently (home health aide M-F 4-6 hours/day) Type of Home: House Home Access: Stairs to enter Entrance Stairs-Rails: Left Entrance Stairs-Number of Steps: 1   Home Layout: One level Home Equipment: Grab  bars - tub/shower;Rolling Walker (2 wheels);Rollator (4 wheels);BSC/3in1;Shower seat;Cane - single point;Cane - quad      Prior Function            PT Goals (current goals can now be found in the care plan section) Acute Rehab PT Goals Patient Stated Goal: agreeable to session, did not state a goal PT Goal Formulation: With patient/family Time For Goal Achievement: 03/04/24 Progress towards PT goals: Progressing toward goals    Frequency    Min 1X/week      PT Plan      Co-evaluation              AM-PAC PT 6 Clicks Mobility   Outcome Measure  Help needed turning from your back to your side while in a flat bed without using bedrails?: None Help needed moving from lying on your back to sitting on the side of a flat bed without using bedrails?: None Help needed moving to and from a bed to a chair (including a wheelchair)?: A Little Help needed standing up from a chair using your arms (e.g., wheelchair or bedside chair)?: A Little Help needed to walk in hospital room?: A Little Help needed climbing 3-5 steps with a railing? : A Little 6 Click Score: 20    End of Session Equipment Utilized During Treatment: Gait belt Activity Tolerance: Patient tolerated treatment well Patient left: in bed;with call bell/phone within  reach;with bed alarm set;with family/visitor present (family at doorway)   PT Visit Diagnosis: Unsteadiness on feet (R26.81);Other abnormalities of gait and mobility (R26.89);Muscle weakness (generalized) (M62.81);Difficulty in walking, not elsewhere classified (R26.2)     Time: 8696-8668 PT Time Calculation (min) (ACUTE ONLY): 28 min  Charges:    $Gait Training: 8-22 mins $Therapeutic Activity: 8-22 mins PT General Charges $$ ACUTE PT VISIT: 1 Visit                     Theo Ferretti, PT, DPT Acute Rehabilitation Services  Office: [phone]    Theo CHRISTELLA Ferretti 02/20/2024, 1:57 PM

## 2024-02-20 NOTE — Progress Notes (Addendum)
 NEUROLOGY CONSULT FOLLOW UP NOTE   Date of service: February 20, 2024 Patient Name: Kellie Harris MRN:  [id number] DOB:  Sep 30, 1930  HPI largely per Dr. Voncile 89 year old with past medical history of arthritis, DJD, previous episodes of expressive aphasia without acute stroke on workup for most part except for 1 time where there was a small right frontal infarct, with concern for episodes being TIAs versus seizures, presenting for evaluation of an episode of expressive aphasia that is persistent. She was at an orthopedic appointment and became somewhat dizzy and short of breath.  That was the reason for being brought to the ER but while in triage, family noted that she suddenly could not talk.  This episode is similar to her prior episodes but she has not had episode in the last 12 days.  The episode prior to that was in 2023 and the prior one in 2019 according to family but on chart review she has had possibly few more episodes. She had another such episode with admission to [facility] after surgery.   Daughter reports that her bills have been taken over by the family since she has more issues with reasoning rather than with memory and this has worsened over the past few years.  She uses a walker to walk.  She lives independent but requires help with ADLs from family.  SABRASABRASABRAAccording to family, last time that she had this episode it took her about 36 hours to come back around.   On exam was felt to have expressive > receptive aphasia without other focal findings, with stereotyped nature of events concerning for seizure vs. Hypoperfusion (in the setting of her significant intracranial atherosclerotic disease especially of left ACA this should be considered). MRI not consistent with amyloid, does have some atrophy and chronic white matter changes likely microvascular disease  Interval Hx/subjective   Self-discontinued leads overnight and again this afternoon    Vitals   Vitals:   02/19/24 2357 02/20/24 0343   BP: (!) 149/78 (!) 162/81  Pulse: 73 73  Resp: 14 14  Temp: 99 F (37.2 C) 97.8 F (36.6 C)  TempSrc: Oral Oral  SpO2: 92% 91%     There is no height or weight on file to calculate BMI.  Physical Exam    General: Sleeping but awakens readily Psychiatric: Very pleasant, smiling, cooperative HEENT: Normocephalic atraumatic, EEG in place with wraps Lungs: No grossly audible wheezing, breathing comfortably Cardiovascular: Perfusing extremities well Extremities: Mitts in place bilateral hands to prevent removal of EEG   Neurological exam She is sleeping initially but awakens readily.  Speech appears improved compared to prior, easily names simple objects.  Makes some minor paraphasic errors but meaning is clearly understood of her longer phrases (for example states that her mother brought her to the hospital because she was concerned about stroke).  Mildly disoriented, reports she is at [employer]  When I tell her she is at [facility] she is able to repeat this but a short time later asks if she will have to go back to [facility], demonstrating poor short-term memory.  Following all simple commands  Unable to provide significant history, does not recall that she had speech difficulties this presentation but does recall that she has had speech difficulties in the past  Pupils equal round reactive to light.  EOMI with saccadic pursuits.  Face symmetric.  Orients to stimuli in all visual fields, hearing intact to voice  Motor examination- reports bilateral shoulder arthritis but able to  lift both arms antigravity with only mild drift.  Good antigravity strength in bilateral legs as well.  Sensation intact to light touch in all 4   Medications  Current Facility-Administered Medications:    0.9 %  sodium chloride  infusion, 250 mL, Intravenous, PRN, Rojelio Nest, DO   acetaminophen  (TYLENOL ) tablet 650 mg, 650 mg, Oral, Q6H PRN **OR** acetaminophen  (TYLENOL ) suppository 650 mg, 650 mg,  Rectal, Q6H PRN, Rojelio Nest, DO   amLODipine  (NORVASC ) tablet 10 mg, 10 mg, Oral, Daily, Rojelio Nest, DO   aspirin  chewable tablet 81 mg, 81 mg, Oral, Daily, Rojelio Nest, DO   atorvastatin  (LIPITOR) tablet 40 mg, 40 mg, Oral, QHS, Rojelio Nest, DO, 40 mg at 02/19/24 2101   cholecalciferol  (VITAMIN D3) 25 MCG (1000 UNIT) tablet 2,000 Units, 2,000 Units, Oral, Daily, Rojelio Nest, DO, 2,000 Units at 02/19/24 1808   cyanocobalamin  (VITAMIN B12) tablet 1,000 mcg, 1,000 mcg, Oral, Daily, Rojelio Nest, DO, 1,000 mcg at 02/19/24 1808   enoxaparin  (LOVENOX ) injection 30 mg, 30 mg, Subcutaneous, Q24H, Rojelio Nest, DO, 30 mg at 02/19/24 1808   latanoprost  (XALATAN ) 0.005 % ophthalmic solution 1 drop, 1 drop, Both Eyes, QHS, Rojelio Nest, DO, 1 drop at 02/19/24 2103   levETIRAcetam  (KEPPRA ) tablet 500 mg, 500 mg, Oral, BID, Rojelio Nest, DO, 500 mg at 02/19/24 2059   losartan  (COZAAR ) tablet 25 mg, 25 mg, Oral, Daily, Rojelio Nest, DO   metoprolol  succinate (TOPROL -XL) 24 hr tablet 12.5 mg, 12.5 mg, Oral, Daily, Rojelio Nest, DO, 12.5 mg at 02/19/24 1808   ondansetron  (ZOFRAN ) tablet 4 mg, 4 mg, Oral, Q6H PRN **OR** ondansetron  (ZOFRAN ) injection 4 mg, 4 mg, Intravenous, Q6H PRN, Rojelio Nest, DO   pantoprazole  (PROTONIX ) EC tablet 20 mg, 20 mg, Oral, Daily, Rojelio Nest, DO, 20 mg at 02/19/24 1808   sodium chloride  flush (NS) 0.9 % injection 3 mL, 3 mL, Intravenous, Q12H, Rojelio Nest, DO, 3 mL at 02/19/24 2104   sodium chloride  flush (NS) 0.9 % injection 3 mL, 3 mL, Intravenous, Q12H, Rojelio Nest, DO, 3 mL at 02/19/24 2103   sodium chloride  flush (NS) 0.9 % injection 3 mL, 3 mL, Intravenous, PRN, Rojelio Nest, DO   timolol  (TIMOPTIC ) 0.5 % ophthalmic solution 1 drop, 1 drop, Both Eyes, Daily, Rojelio Nest, DO, 1 drop at 02/19/24 2100  Labs and Diagnostic Imaging   CBC:  Recent Labs  Lab 02/18/24 1756 02/19/24 1747 02/20/24 0524  WBC 16.0* 14.0* 11.8*   NEUTROABS 14.1*  --   --   HGB 14.4 13.6 13.7  HCT 42.6 40.5 41.3  MCV 88.6 89.8 90.8  PLT 324 238 238    Basic Metabolic Panel:  Lab Results  Component Value Date   NA 138 02/20/2024   K 3.1 (L) 02/20/2024   CO2 24 02/20/2024   GLUCOSE 90 02/20/2024   BUN 10 02/20/2024   CREATININE 1.01 (H) 02/20/2024   CALCIUM  9.4 02/20/2024   GFRNONAA 53 (L) 02/20/2024   GFRAA >60 05/04/2020   Lipid Panel:  Lab Results  Component Value Date   LDLCALC 74 02/19/2024   HgbA1c:  Lab Results  Component Value Date   HGBA1C 4.9 02/19/2024   Urine Drug Screen:     Component Value Date/Time   LABOPIA NONE DETECTED 06/12/2021 1825   COCAINSCRNUR NONE DETECTED 06/12/2021 1825   LABBENZ NONE DETECTED 06/12/2021 1825   AMPHETMU NONE DETECTED 06/12/2021 1825   THCU NONE DETECTED 06/12/2021 1825   LABBARB NONE DETECTED 06/12/2021 1825  Alcohol Level     Component Value Date/Time   [facility] <15 02/19/2024 0145   INR  Lab Results  Component Value Date   INR 1.0 02/18/2024   APTT  Lab Results  Component Value Date   APTT 30 02/18/2024   CT Head without contrast(Personally reviewed): No acute findings  CT angio Head and Neck with contrast(Personally reviewed): No ELVO 1. No proximal intracranial large vessel occlusion identified. 2. Intracranial atherosclerotic disease with multifocal stenoses, most notably as follows. 3. Atherosclerotic plaque within the left vertebral artery at the V3/V4 junction, progressed from the prior CTA of 04/03/2018 and now resulting in at least moderate stenosis. 4. New moderate-to-severe stenosis within the left posterior cerebral artery at the P3/P4 junction. 5. New moderate stenosis within the right PCA P2 segment. 6. New sites of up to moderate stenosis within the left anterior cerebral artery A4 segment. 7. Unchanged 3 mm vascular protrusion arising from the right middle cerebral artery distal M1 segment, which may reflect an aneurysm  or infundibulum.  MRI Brain(Personally reviewed): Punctate focus of diffusion signal abnormality in the high left frontal lobe suspicious for acute to early subacute ischemic infarct.  No other acute intracranial abnormality.  Age-related cerebral atrophy with moderate chronic small vessel ischemic disease with few scattered remote lacunar infarctions involving the right frontal corona radiata, thalami and cerebellum.  ECHO:   1. Left ventricular ejection fraction, by estimation, is 60 to 65%. The  left ventricle has normal function. The left ventricle has no regional  wall motion abnormalities. Left ventricular diastolic parameters are  consistent with Grade I diastolic  dysfunction (impaired relaxation).   2. Right ventricular systolic function is normal. The right ventricular  size is normal.   3. The mitral valve is normal in structure. Trivial mitral valve  regurgitation.   4. The aortic valve is normal in structure. Aortic valve regurgitation is mild to moderate.  [Normal biatrial sizes]  EEG:  Pending  Assessment   Zaylin Pistilli Yeley is a 89 y.o. female past history of stereotypic episodes of aphasia that has been worked up for stroke/TIA versus seizures, 1 MRI in the past positive for a small right frontal infarct, presenting for episode of aphasia-more expressive aphasia than receptive aphasia.  Aphasia has improved at this time, nearing baseline but not back to baseline  Episodic aphasia not explained by minor strokes seen on MRI on 2 occasions, concern for underlying seizures versus speech arrest/behavioral changes associated with dementia (such as primary progressive aphasia) versus hypoperfusion / TIAs secondary to intracranial atherosclerotic disease -- particularly aphasia can be seen from either MCA or ACA disease and she does have significant left ACA stenosis  Unfortunately at this time patient has self discontinued EEG leads twice, the second time even with mitts in place.  I  do not think that the benefits of restraining her more heavily is worth the risk of inciting delirium especially given the infrequency of her events overall.  Very long discussion with daughter.  Unclear if the Keppra  trial in the past improved her spells or not.  She likely did still have some symptoms but it is unclear if the frequency/severity may have improved.  Patient did not like how Keppra  made her feel and was eager to be off of it.  Will trial lamotrigine  instead  We also discussed her intracranial atherosclerotic disease and potential to escalate from aspirin  to Plavix .  Ultimately as she has had 2 falls this year this would make her at higher  risk of intracerebral hemorrhage with Plavix  and again the patient did not like how Plavix  made her feel in the past, so we will hold off on changing this  Recommendations   For the stereotypic episodes of aphasia/speech arrest LTM EEG --discontinue due to patient unable to maintain wires Trial lamotrigine , please provide with at least 4 weeks of lamotrigine  with further refills deferred to her outpatient neurologist   Morning  Night  Week 1 and 2   none   25 mg   Week 3 and 4   25 mg    25 mg  Week 5   25 mg   50 mg  Week 6   50 mg   75 mg  Week 7   75 mg 100 mg  Week 8  100 mg 125 mg  Outpatient follow-up with Dr. Lane, recommend outpatient neurocognitive testing and consideration of either ambulatory EEG monitoring or planned EMU stay with family providing one-to-one supervision of patient to allow diagnostic testing to be completed  Stroke workup, Subacute looking punctate left frontal infarct,  Her LDL is near goal at 74.  Goal is less than 70.  Continue home statin atorvastatin  40. Her A1c is at goal-5.0.  Goal is less than 7. Echocardiogram with Grade I diastolic dysfunction  CTA with significant intracranial atherosclerotic disease as above Continue aspirin  Continue therapy assessments  Inpatient neurology will sign off at this  time, please reach out if additional questions or concerns arise, discussed with Dr. Rojelio via phone ______________________________________________________________________  Lola Jernigan MD-PhD [facility] [phone] Available 7 AM to 7 PM, outside these hours please contact Neurologist on call listed on AMION   >60 min spent in care, majority in direct care / family discussion

## 2024-02-20 NOTE — NC FL2 (Signed)
 [place]  MEDICAID FL2 LEVEL OF CARE FORM     IDENTIFICATION  Patient Name: Kellie Harris Birthdate: [birth year] Sex: female Admission Date (Current Location): 02/19/2024  [facility] and [place] Number:  Chiropodist and Address:  [facility] [place], [address] [place], [place] [address]      Provider Number: [id number]  Attending Physician Name and Address:  Rojelio Nest, DO  Relative Name and Phone Number:       Current Level of Care: Hospital Recommended Level of Care: Skilled Nursing Facility Prior Approval Number:    Date Approved/Denied:   PASRR Number: [id number]  Discharge Plan: SNF    Current Diagnoses: Patient Active Problem List   Diagnosis Date Noted   Expressive aphasia 02/18/2024   Altered mental status 02/18/2024   Hyponatremia 09/08/2021   Acute blood loss anemia 09/08/2021   Hypokalemia 09/07/2021   History of total hip replacement, left 09/04/2021   Dizziness 01/28/2021   Aphasia 05/03/2020   S/P ORIF (open reduction internal fixation) fracture radius and ulnar fx 04/08/18 04/10/2018   Distal radius and ulna fracture, right    History of CVA (cerebrovascular accident)    Tachycardia    Leukocytosis    Seizures (HCC)    Stroke-like episode (HCC) s/p IV tPA 04/03/2018   Syncope    TIA (transient ischemic attack) 11/04/2017   Malnutrition of moderate degree 08/26/2017   Total knee replacement status 08/07/2017   Closed left hip fracture (HCC) 02/12/2017   Essential hypertension 02/12/2017   HLD (hyperlipidemia) 02/12/2017    Orientation RESPIRATION BLADDER Height & Weight     Self, Situation, Place  Normal Continent Weight:   Height:     BEHAVIORAL SYMPTOMS/MOOD NEUROLOGICAL BOWEL NUTRITION STATUS      Continent Diet (regular)  AMBULATORY STATUS COMMUNICATION OF NEEDS Skin   Limited Assist Verbally Normal                       Personal Care Assistance Level of Assistance  Bathing, Dressing, Feeding  Bathing Assistance: Limited assistance Feeding assistance: Limited assistance Dressing Assistance: Limited assistance     Functional Limitations Info  Hearing   Hearing Info: Impaired (hearing aids)      SPECIAL CARE FACTORS FREQUENCY  PT (By licensed PT), OT (By licensed OT)     PT Frequency: 5x/wk OT Frequency: 5x/wk            Contractures Contractures Info: Not present    Additional Factors Info  Code Status, Allergies Code Status Info: DNR Allergies Info: NKA           Current Medications (02/20/2024):  This is the current hospital active medication list Current Facility-Administered Medications  Medication Dose Route Frequency Provider Last Rate Last Admin   0.9 %  sodium chloride  infusion  250 mL Intravenous PRN Rojelio Nest, DO       acetaminophen  (TYLENOL ) tablet 650 mg  650 mg Oral Q6H PRN Rojelio Nest, DO       Or   acetaminophen  (TYLENOL ) suppository 650 mg  650 mg Rectal Q6H PRN Rojelio Nest, DO       amLODipine  (NORVASC ) tablet 10 mg  10 mg Oral Daily Rojelio Nest, DO   10 mg at 02/20/24 9060   aspirin  chewable tablet 81 mg  81 mg Oral Daily Rojelio Nest, DO   81 mg at 02/20/24 9060   atorvastatin  (LIPITOR) tablet 40 mg  40 mg Oral QHS Rojelio Nest, DO  40 mg at 02/19/24 2101   cholecalciferol  (VITAMIN D3) 25 MCG (1000 UNIT) tablet 2,000 Units  2,000 Units Oral Daily Rojelio Nest, DO   2,000 Units at 02/20/24 9061   cyanocobalamin  (VITAMIN B12) tablet 1,000 mcg  1,000 mcg Oral Daily Rojelio Nest, DO   1,000 mcg at 02/20/24 9061   enoxaparin  (LOVENOX ) injection 30 mg  30 mg Subcutaneous Q24H Rojelio Nest, DO   30 mg at 02/19/24 8191   lamoTRIgine  (LAMICTAL ) tablet 25 mg  25 mg Oral QHS Bhagat, Srishti L, MD       Followed by   NOREEN ON 03/05/2024] lamoTRIgine  (LAMICTAL ) tablet 25 mg  25 mg Oral BID Bhagat, Srishti L, MD       latanoprost  (XALATAN ) 0.005 % ophthalmic solution 1 drop  1 drop Both Eyes QHS Rojelio Nest, DO   1 drop at  02/19/24 2103   losartan  (COZAAR ) tablet 25 mg  25 mg Oral Daily Rojelio Nest, DO   25 mg at 02/20/24 9060   metoprolol  succinate (TOPROL -XL) 24 hr tablet 12.5 mg  12.5 mg Oral Daily Rojelio Nest, DO   12.5 mg at 02/20/24 9061   ondansetron  (ZOFRAN ) tablet 4 mg  4 mg Oral Q6H PRN Rojelio Nest, DO       Or   ondansetron  (ZOFRAN ) injection 4 mg  4 mg Intravenous Q6H PRN Rojelio Nest, DO       pantoprazole  (PROTONIX ) EC tablet 20 mg  20 mg Oral Daily Rojelio Nest, DO   20 mg at 02/20/24 9060   sodium chloride  flush (NS) 0.9 % injection 3 mL  3 mL Intravenous Q12H Rojelio Nest, DO   3 mL at 02/20/24 0940   sodium chloride  flush (NS) 0.9 % injection 3 mL  3 mL Intravenous Q12H Rojelio Nest, DO   3 mL at 02/20/24 0940   sodium chloride  flush (NS) 0.9 % injection 3 mL  3 mL Intravenous PRN Rojelio Nest, DO       timolol  (TIMOPTIC ) 0.5 % ophthalmic solution 1 drop  1 drop Both Eyes Daily Rojelio Nest, DO   1 drop at 02/20/24 0940     Discharge Medications: Please see discharge summary for a list of discharge medications.  Relevant Imaging Results:  Relevant Lab Results:   Additional Information SS#: [id number]  Kellie CHRISTELLA Goodie, LCSW

## 2024-02-20 NOTE — Progress Notes (Signed)
 LTM maint complete - no skin breakdown under: Fp1, Fp2, A1, Ground. Impedance all under [employer]

## 2024-02-20 NOTE — Procedures (Addendum)
 Patient Name: TENITA CUE  MRN: [id number]  Epilepsy Attending: Arlin MALVA Krebs  Referring Physician/Provider: Jerrie Lola CROME, MD  Duration: 02/20/2024 9666 to 1352  Patient history: 89yo F with repeated episodes of aphasia. EEG to evaluate for seizure.  Level of alertness: Awake, asleep  AEDs during EEG study: None  Technical aspects: This EEG study was done with scalp electrodes positioned according to the 10-20 International system of electrode placement. Electrical activity was reviewed with band pass filter of 1-70Hz , sensitivity of 7 uV/mm, display speed of 61mm/sec with a 60Hz  notched filter applied as appropriate. EEG data were recorded continuously and digitally stored.  Video monitoring was available and reviewed as appropriate.  Description: The posterior dominant rhythm consists of 8 Hz activity of moderate voltage (25-35 uV) seen predominantly in posterior head regions, symmetric and reactive to eye opening and eye closing. Sleep was characterized by vertex waves, sleep spindles (12 to 14 Hz), maximal frontocentral region.  EEG showed intermittent generalized and lateralized left hemisphere 3-5hz  theta- delta slowing. Hyperventilation and photic stimulation were not performed.     EEG was not interpretable between 0618 to 0817 and after 1230 due to significant electrode artifact.  ABNORMALITY - Intermittent slow, generalized and lateralized left hemisphere  IMPRESSION: This study is suggestive of nonspecific cortical dysfunction in left hemisphere as well as mild diffuse encephalopathy.  No seizures or epileptiform discharges were seen throughout the recording.  Kalin Kyler O Regine Christian

## 2024-02-20 NOTE — Plan of Care (Signed)
   Problem: Clinical Measurements: Goal: Diagnostic test results will improve Outcome: Progressing   Problem: Activity: Goal: Risk for activity intolerance will decrease Outcome: Progressing

## 2024-02-20 NOTE — Progress Notes (Signed)
 LTM VIDEO EEG discontinued - no skin breakdown at [facility].

## 2024-02-20 NOTE — Progress Notes (Addendum)
 LTM EEG hooked up and running - no initial skin breakdown - push button tested - [facility] monitoring.

## 2024-02-20 NOTE — Progress Notes (Signed)
 OT Note Pt evaluated 7/9 at [facility] and recommendation at that time is continued inpatient follow up therapy, <3 hours/day. Kreg Sink, OT/L   Acute OT Clinical Specialist Acute Rehabilitation Services Pager [phone] Office [phone]

## 2024-02-20 NOTE — Progress Notes (Signed)
 PROGRESS NOTE    Kellie Harris  FMW:[id number] DOB: 09-01-1930 DOA: 02/19/2024 PCP: [name], Harris     Brief Narrative:  Kellie Harris is a 89 year old female with degenerative joint disease, arthritis, hypertension, hypercholesterolemia.  Kellie Harris was at her outpatient [facility] orthopedic clinic when she was noted to be dizzy and short of breath and was recommended to come to the ED.  Not long after arrival she became acutely aphasic.  Neurology was consulted and Kellie Harris was admitted for workup.  Kellie Harris has had multiple episodes similar to this in the past which have been negative for stroke/TIA workup.  1 prior MRI did reveal a small right frontal infarct.  MRI this admission shows a punctate focus of diffusion abnormality concerning for acute to early subacute ischemic infarct in the left frontal lobe.  Per neurology review this is unlikely to be causing symptoms.  Given consistency of symptoms in the past there is concern for underlying seizure activity.  She was loaded with Keppra  and started on Keppra  500 mg twice daily.  Neurology has recommended long-term EEG to better capture and characterize these spells.  She was transferred to [facility] on 02/19/24.   New events last 24 hours / Subjective: Confused this morning, says she needs to get home to take care of her mother who is 37 years old. Tells me that Kellie Harris herself is 89 years old. She is oriented to [facility] and year 95 but then corrects herself to year 70. She knows that she is here for evaluation for seizures but is unaware that she is currently undergoing cEEG   Assessment & Plan:  Principal Problem:   Aphasia Active Problems:   Essential hypertension   HLD (hyperlipidemia)   Leukocytosis   Stereotypic episodes of aphasia/speech arrest - Cannot rule out dementia associated speech arrest versus seizure activity.  Multiple similar episodes in the past. - S/p Keppra  load and now of 500mg  BID - on hold -  cEEG. Neurology following, discussed with Dr. Jerrie    Leukocytosis - Likely reactive - CXR unremarkable - UA ordered  - WBC improved    Arthritis - Pain control    Hypertension - Norvasc , losartan , toprol     Hypercholesterolemia - Lipitor   Hypokalemia - Replace      DVT prophylaxis:  enoxaparin  (LOVENOX ) injection 30 mg Start: 02/19/24 1800  Code Status: DNR Family Communication: None at bedside, will attempt to reach out to daughter this afternoon Disposition Plan: Home Status is: Inpatient Remains inpatient appropriate because: cEEG     Antimicrobials:  Anti-infectives (From admission, onward)    None        Objective: Vitals:   02/19/24 2357 02/20/24 0343 02/20/24 1133  BP: (!) 149/78 (!) 162/81 (!) 152/84  Pulse: 73 73 90  Resp: 14 14 18   Temp: 99 F (37.2 C) 97.8 F (36.6 C) 98.2 F (36.8 C)  TempSrc: Oral Oral Oral  SpO2: 92% 91% 90%    Intake/Output Summary (Last 24 hours) at 02/20/2024 1217 Last data filed at 02/20/2024 0600 Gross per 24 hour  Intake 340 ml  Output --  Net 340 ml   There were no vitals filed for this visit.  Examination:  General exam: Appears calm and comfortable  Respiratory system: Clear to auscultation. Respiratory effort normal. Cardiovascular system: S1 & S2 heard, RRR.  Gastrointestinal system: Abdomen is nondistended, soft  Central nervous system: Alert and oriented to self  Extremities: Symmetric in appearance  Skin: No rashes,  lesions or ulcers on exposed skin  Psychiatry: Judgement and insight appear poor   Data Reviewed: I have personally reviewed following labs and imaging studies  CBC: Recent Labs  Lab 02/18/24 1546 02/18/24 1756 02/19/24 1747 02/20/24 0524  WBC 16.3* 16.0* 14.0* 11.8*  NEUTROABS  --  14.1*  --   --   HGB 14.9 14.4 13.6 13.7  HCT 45.8 42.6 40.5 41.3  MCV 91.2 88.6 89.8 90.8  PLT 329 324 238 238   Basic Metabolic Panel: Recent Labs  Lab 02/18/24 1546 02/20/24 0524   NA 134* 138  K 4.3 3.1*  CL 97* 102  CO2 20* 24  GLUCOSE 118* 90  BUN 13 10  CREATININE 0.95 1.01*  CALCIUM  10.3 9.4   GFR: Estimated Creatinine Clearance: 25.2 mL/min (A) (by C-G formula based on SCr of 1.01 mg/dL (H)). Liver Function Tests: Recent Labs  Lab 02/18/24 1546  AST 26  ALT 13  ALKPHOS 68  BILITOT 1.4*  PROT 8.2*  ALBUMIN 5.1*   No results for input(s): LIPASE, AMYLASE in the last 168 hours. No results for input(s): AMMONIA in the last 168 hours. Coagulation Profile: Recent Labs  Lab 02/18/24 1756  INR 1.0   Cardiac Enzymes: No results for input(s): CKTOTAL, CKMB, CKMBINDEX, TROPONINI in the last 168 hours. BNP (last 3 results) No results for input(s): PROBNP in the last 8760 hours. HbA1C: Recent Labs    02/19/24 0443  HGBA1C 4.9   CBG: Recent Labs  Lab 02/18/24 1743  GLUCAP 121*   Lipid Profile: Recent Labs    02/19/24 0443  CHOL 143  HDL 49  LDLCALC 74  TRIG 102  CHOLHDL 2.9   Thyroid  Function Tests: No results for input(s): TSH, T4TOTAL, FREET4, T3FREE, THYROIDAB in the last 72 hours. Anemia Panel: No results for input(s): VITAMINB12, FOLATE, FERRITIN, TIBC, IRON, RETICCTPCT in the last 72 hours. Sepsis Labs: No results for input(s): PROCALCITON, LATICACIDVEN in the last 168 hours.  No results found for this or any previous visit (from the past 240 hours).    Radiology Studies: Overnight EEG with video Result Date: 02/20/2024 Kellie Kellie KIDD, Harris     02/20/2024  8:57 AM Kellie Harris Name: Kellie Harris MRN: [id number] Epilepsy Attending: Arlin Harris Kellie Referring Physician/Provider: Jerrie Lola CROME, Harris Duration: 02/20/2024 9666 to 9149 Kellie Harris history: 89yo F with repeated episodes of aphasia. EEG to evaluate for seizure. Level of alertness: Awake, asleep AEDs during EEG study: None Technical aspects: This EEG study was done with scalp electrodes positioned according to the 10-20 International  system of electrode placement. Electrical activity was reviewed with band pass filter of 1-70Hz , sensitivity of 7 uV/mm, display speed of 67mm/sec with a 60Hz  notched filter applied as appropriate. EEG data were recorded continuously and digitally stored.  Video monitoring was available and reviewed as appropriate. Description: The posterior dominant rhythm consists of 8 Hz activity of moderate voltage (25-35 uV) seen predominantly in posterior head regions, symmetric and reactive to eye opening and eye closing. Sleep was characterized by vertex waves, sleep spindles (12 to 14 Hz), maximal frontocentral region.  EEG showed intermittent generalized and lateralized left hemisphere 3-5hz  theta- delta slowing. Hyperventilation and photic stimulation were not performed.   EEG was not interpretable between 0618 to 0817 due to significant electrode artifact. ABNORMALITY - Intermittent slow, generalized and lateralized left hemisphere IMPRESSION: This study is suggestive of nonspecific cortical dysfunction in left hemisphere as well as mild diffuse encephalopathy.  No seizures or epileptiform discharges  were seen throughout the recording. Kellie MALVA Krebs   ECHOCARDIOGRAM COMPLETE Result Date: 02/19/2024    ECHOCARDIOGRAM REPORT   Kellie Harris Name:   Kellie Harris Date of Exam: 02/19/2024 Medical Rec #:  [id number]   Height:       59.0 in Accession #:    [id number]  Weight:       105.8 lb Date of Birth:  1931-07-15    BSA:          1.407 m Kellie Harris Age:    89 years    BP:           148/91 mmHg Kellie Harris Gender: F           HR:           81 bpm. Exam Location:  [facility] Procedure: 2D Echo, Color Doppler and Cardiac Doppler (Both Spectral and Color            Flow Doppler were utilized during procedure). Indications:     Stroke I63.9  History:         Kellie Harris has prior history of Echocardiogram examinations, most                  recent 01/30/2021. Stroke; Arrythmias:LBBB.  Sonographer:     Ashley McNeely-Sloane Referring Phys:  [id number] AMY  N [facility] Diagnosing Phys: Kellie Harris  Sonographer Comments: Image acquisition challenging due to uncooperative Kellie Harris. IMPRESSIONS  1. Left ventricular ejection fraction, by estimation, is 60 to 65%. The left ventricle has normal function. The left ventricle has no regional wall motion abnormalities. Left ventricular diastolic parameters are consistent with Grade I diastolic dysfunction (impaired relaxation).  2. Right ventricular systolic function is normal. The right ventricular size is normal.  3. The mitral valve is normal in structure. Trivial mitral valve regurgitation.  4. The aortic valve is normal in structure. Aortic valve regurgitation is mild to moderate. FINDINGS  Left Ventricle: Left ventricular ejection fraction, by estimation, is 60 to 65%. The left ventricle has normal function. The left ventricle has no regional wall motion abnormalities. Strain was performed and the global longitudinal strain is indeterminate. Global longitudinal strain performed but not reported based on interpreter judgement due to suboptimal tracking. The left ventricular internal cavity size was normal in size. There is no left ventricular hypertrophy. Left ventricular diastolic parameters are consistent with Grade I diastolic dysfunction (impaired relaxation). Right Ventricle: The right ventricular size is normal. No increase in right ventricular wall thickness. Right ventricular systolic function is normal. Left Atrium: Left atrial size was normal in size. Right Atrium: Right atrial size was normal in size. Pericardium: There is no evidence of pericardial effusion. Mitral Valve: The mitral valve is normal in structure. Trivial mitral valve regurgitation. Tricuspid Valve: The tricuspid valve is normal in structure. Tricuspid valve regurgitation is mild. Aortic Valve: The aortic valve is normal in structure. Aortic valve regurgitation is mild to moderate. Aortic regurgitation PHT measures 364 msec. Aortic valve mean  gradient measures 6.0 mmHg. Aortic valve peak gradient measures 11.3 mmHg. Aortic valve area, by VTI measures 2.09 cm. Pulmonic Valve: The pulmonic valve was grossly normal. Pulmonic valve regurgitation is not visualized. Aorta: The ascending aorta was not well visualized. IAS/Shunts: No atrial level shunt detected by color flow Doppler. Additional Comments: 3D was performed not requiring image post processing on an independent workstation and was indeterminate.  LEFT VENTRICLE PLAX 2D LVIDd:         3.50 cm  Diastology LVIDs:         1.70 cm     LV e' medial:    3.37 cm/s LV PW:         0.80 cm     LV E/e' medial:  19.8 LV IVS:        0.70 cm     LV e' lateral:   6.31 cm/s LVOT diam:     1.90 cm     LV E/e' lateral: 10.6 LV SV:         61 LV SV Index:   43 LVOT Area:     2.84 cm  LV Volumes (MOD) LV vol d, MOD A2C: 27.3 ml LV vol d, MOD A4C: 47.2 ml LV vol s, MOD A2C: 14.3 ml LV vol s, MOD A4C: 18.6 ml LV SV MOD A2C:     13.0 ml LV SV MOD A4C:     47.2 ml LV SV MOD BP:      21.1 ml RIGHT VENTRICLE             IVC RV Basal diam:  4.10 cm     IVC diam: 1.30 cm RV S prime:     11.10 cm/s TAPSE (M-mode): 1.3 cm LEFT ATRIUM           Index        RIGHT ATRIUM           Index LA diam:      2.70 cm 1.92 cm/m   RA Area:     14.30 cm LA Vol (A2C): 25.7 ml 18.26 ml/m  RA Volume:   34.20 ml  24.30 ml/m LA Vol (A4C): 11.0 ml 7.82 ml/m  AORTIC VALVE                     PULMONIC VALVE AV Area (Vmax):    1.94 cm      PV Vmax:        0.79 m/s AV Area (Vmean):   1.79 cm      PV Vmean:       54.100 cm/s AV Area (VTI):     2.09 cm      PV VTI:         0.155 m AV Vmax:           168.00 cm/s   PV Peak grad:   2.5 mmHg AV Vmean:          113.000 cm/s  PV Mean grad:   1.0 mmHg AV VTI:            0.292 m       RVOT Peak grad: 1 mmHg AV Peak Grad:      11.3 mmHg AV Mean Grad:      6.0 mmHg LVOT Vmax:         115.00 cm/s LVOT Vmean:        71.300 cm/s LVOT VTI:          0.215 m LVOT/AV VTI ratio: 0.74 AI PHT:            364  msec  AORTA Ao Root diam: 3.10 cm Ao Asc diam:  2.40 cm MITRAL VALVE                TRICUSPID VALVE MV Area (PHT): 2.95 cm     TR Peak grad:   26.8 mmHg MV Decel Time: 257 msec     TR Mean grad:   21.0 mmHg MV E  velocity: 66.60 cm/s   TR Vmax:        259.00 cm/s MV A velocity: 118.00 cm/s  TR Vmean:       224.0 cm/s MV E/A ratio:  0.56                             SHUNTS                             Systemic VTI:  0.22 m                             Systemic Diam: 1.90 cm                             Pulmonic VTI:  0.088 m Cara JONETTA Lovelace Harris Electronically signed by Cara JONETTA Lovelace Harris Signature Date/Time: 02/19/2024/9:02:47 PM    Final    MR BRAIN WO CONTRAST Result Date: 02/19/2024 CLINICAL DATA:  Initial evaluation for acute neuro deficit, stroke suspected. EXAM: MRI HEAD WITHOUT CONTRAST TECHNIQUE: Multiplanar, multiecho pulse sequences of the brain and surrounding structures were obtained without intravenous contrast. COMPARISON:  CTs from earlier the same day. FINDINGS: Brain: Examination degraded by motion artifact. Generalized age-related cerebral atrophy. Patchy T2/FLAIR hyperintensity involving the periventricular and deep white matter, consistent with chronic small vessel ischemic disease, moderately advanced in nature. Few scatter remote lacunar infarcts present about the right frontal corona radiata and thalami. Small remote cerebellar infarcts, right greater than left. w single punctate focus of mild diffusion signal abnormality seen involving the high left frontal lobe (series 5, image 46), suspicious for a small acute to early subacute ischemic infarct. Initial this is not well seen on corresponding coronal DWI sequence. No associated hemorrhage. No other evidence for acute or subacute ischemia. Gray-white matter differentiation otherwise maintained. No acute intracranial hemorrhage. Few punctate chronic micro hemorrhages noted within the right cerebellum and left thalamus. No mass lesion, midline  shift or mass effect. Mild ventricular prominence related global parenchymal volume loss without hydrocephalus. No extra-axial fluid collection. Pituitary gland within normal limits. Vascular: Major intracranial vascular flow voids are maintained. Skull and upper cervical spine: Craniocervical junction within normal limits. Bone marrow signal intensity within normal limits. No scalp soft tissue abnormality. Sinuses/Orbits: Globes orbital soft tissues demonstrate no acute finding. Prior bilateral ocular lens replacement. Paranasal sinuses are largely clear. No significant mastoid effusion. Other: None. IMPRESSION: 1. Single punctate focus of diffusion signal abnormality involving the high left frontal lobe, suspicious for a small acute to early subacute ischemic infarct. No associated hemorrhage. 2. No other acute intracranial abnormality. 3. Age-related cerebral atrophy with moderate chronic small vessel ischemic disease, with a few scattered remote lacunar infarcts involving the right frontal corona radiata, thalami, and cerebellum. Electronically Signed   By: Morene Hoard M.D.   On: 02/19/2024 00:26   DG Chest Port 1 View Result Date: 02/18/2024 CLINICAL DATA:  Altered mental status, leukocytosis EXAM: PORTABLE CHEST 1 VIEW COMPARISON:  09/07/2021 FINDINGS: Heart and mediastinal contours are within normal limits. No focal opacities or effusions. No acute bony abnormality. Aortic atherosclerosis. IMPRESSION: No active disease. Electronically Signed   By: Franky Crease M.D.   On: 02/18/2024 20:38   CT ANGIO HEAD NECK W WO CM (CODE STROKE) Result Date: 02/18/2024 CLINICAL DATA:  Provided  history: Stroke, follow-up. EXAM: CT ANGIOGRAPHY HEAD AND NECK WITH AND WITHOUT CONTRAST TECHNIQUE: Multidetector CT imaging of the head and neck was performed using the standard protocol during bolus administration of intravenous contrast. Multiplanar CT image reconstructions and MIPs were obtained to evaluate the  vascular anatomy. Carotid stenosis measurements (when applicable) are obtained utilizing NASCET criteria, using the distal internal carotid diameter as the denominator. RADIATION DOSE REDUCTION: This exam was performed according to the departmental dose-optimization program which includes automated exposure control, adjustment of the mA and/or kV according to Kellie Harris size and/or use of iterative reconstruction technique. CONTRAST:  75mL OMNIPAQUE  IOHEXOL  350 MG/ML SOLN COMPARISON:  Noncontrast head CT performed earlier today 02/18/2024. CT angiogram head/neck 04/03/2018. FINDINGS: CTA NECK FINDINGS Aortic arch: Standard aortic branching. Atherosclerotic plaque within the visible thoracic aorta and proximal major branch vessels of the neck. Atherosclerotic plaque within the visible thoracic aorta and proximal major branch vessels of the neck. Streak/beam hardening artifact arising from a dense contrast bolus partially obscures the right subclavian artery. Within this limitation, there is no appreciable hemodynamically significant innominate or proximal subclavian artery stenosis. Right carotid system: CCA and ICA patent within the neck without stenosis. Mild atherosclerotic plaque about the carotid bifurcation. Left carotid system: CCA and ICA patent within the neck without measurable stenosis. Nonstenotic atherosclerotic plaque at the CCA origin, about the carotid bifurcation and within the proximal ICA. Vertebral arteries: Patent within the neck. The right vertebral artery is slightly dominant. Nonstenotic calcified plaque at the right vertebral artery origin and within the right V3 segment. Nonstenotic calcified plaque within the proximal left V3 segment. Atherosclerotic plaque within the left vertebral artery at the V3/V4 junction which has progressed and now results in at least moderate stenosis. Skeleton: Nonspecific reversal of the expected cervical doses. Grade 1 anterolisthesis at C2-C3. Cervical spondylosis  and multilevel vertebral ankylosis. No acute fracture or aggressive osseous lesion. Other neck: No neck mass or cervical lymphadenopathy. Upper chest: No consolidation within the imaged lung apices. Review of the MIP images confirms the above findings CTA HEAD FINDINGS Anterior circulation: The intracranial internal carotid arteries are patent. Nonstenotic calcified plaque within both vessels The M1 middle cerebral arteries are patent. No M2 proximal branch occlusion or high-grade proximal stenosis. The anterior cerebral arteries are patent. New sites of up to moderate stenosis within the left anterior cerebral artery A4 segment. Unchanged 3 mm ventrally projecting vascular protrusion arising from the right middle cerebral artery distal M1 segment, which may reflect an aneurysm or infundibulum. Posterior circulation: The intracranial vertebral arteries are patent. Nonstenotic atherosclerotic plaque within the intracranial right vertebral atherosclerotic plaque within the intracranial right vertebral artery with no more than mild stenosis. Progressive atherosclerotic plaque within the left vertebral artery at the V3/V4 junction, now with at least moderate stenosis. The basilar artery is patent. The posterior cerebral arteries are patent. Moderate stenosis within the right PCA P2 segment, new from the prior exam. Moderate to severe stenosis within the left PCA at the P2/P3 junction, also new from the prior exam. Posterior communicating arteries are diminutive or absent, bilaterally. Venous sinuses: Contrast timing precludes evaluation for dural venous sinus thrombosis. Anatomic variants: As described. Review of the MIP images confirms the above findings No emergent large vessel occlusion identified. These results were communicated to Dr. Voncile at 7:34 pmon 7/8/2025by text page via the [facility] messaging system. IMPRESSION: CTA neck: 1. The common carotid and internal carotid arteries are patent within the neck without  stenosis. Mild atherosclerotic plaque bilaterally, as  described. 2. The vertebral arteries are patent within the neck. Atherosclerotic plaque within the left vertebral artery at the V3/V4 junction, progressed from the prior CTA of 04/03/2018 and now resulting in at least moderate stenosis. Non-stenotic atherosclerotic plaque elsewhere within bilateral cervical vertebral arteries. 3. Aortic Atherosclerosis (ICD10-I70.0). CTA head: 1. No proximal intracranial large vessel occlusion identified. 2. Intracranial atherosclerotic disease with multifocal stenoses, most notably as follows. 3. Atherosclerotic plaque within the left vertebral artery at the V3/V4 junction, progressed from the prior CTA of 04/03/2018 and now resulting in at least moderate stenosis. 4. New moderate-to-severe stenosis within the left posterior cerebral artery at the P3/P4 junction. 5. New moderate stenosis within the right PCA P2 segment. 6. New sites of up to moderate stenosis within the left anterior cerebral artery A4 segment. 7. Unchanged 3 mm vascular protrusion arising from the right middle cerebral artery distal M1 segment, which may reflect an aneurysm or infundibulum. Electronically Signed   By: Kellie Childs D.O.   On: 02/18/2024 19:34   CT HEAD CODE STROKE WO CONTRAST Result Date: 02/18/2024 CLINICAL DATA:  Code stroke. Neuro deficit, acute, stroke suspected. Shortness of breath. Dizziness. EXAM: CT HEAD WITHOUT CONTRAST TECHNIQUE: Contiguous axial images were obtained from the base of the skull through the vertex without intravenous contrast. RADIATION DOSE REDUCTION: This exam was performed according to the departmental dose-optimization program which includes automated exposure control, adjustment of the mA and/or kV according to Kellie Harris size and/or use of iterative reconstruction technique. COMPARISON:  Head CT 02/06/2024. Brain MRI 06/01/2022. FINDINGS: Brain: Generalized cerebral atrophy. Known chronic infarcts within the right  corona radiata, thalami, left occipital lobe and bilateral cerebellar hemispheres, some of which were better appreciated on the prior brain MRI of 06/01/2022. Background advanced patchy and ill-defined hypoattenuation within the cerebral white matter, nonspecific but compatible chronic small vessel fall ischemic disease. There is no acute intracranial hemorrhage. No acute demarcated cortical infarct. No extra-axial fluid collection. No evidence of an intracranial mass. No midline shift. Vascular: No hyperdense vessel. Atherosclerotic calcifications. Skull: No calvarial fracture or aggressive osseous lesion. Sinuses/Orbits: No mass or acute finding within the imaged orbits. No significant paranasal sinus disease at the imaged levels. ASPECTS [facility] Stroke Program Early CT Score) - Ganglionic level infarction (caudate, lentiform nuclei, internal capsule, insula, M1-M3 cortex): 7 - Supraganglionic infarction (M4-M6 cortex): 3 Total score (0-10 with 10 being normal): 10 (when discounting chronic infarcts). No evidence of an acute intracranial abnormality. These results were called by telephone at the time of interpretation on 02/18/2024 at 6:30 pm to provider [facility] , who verbally acknowledged these results. IMPRESSION: 1. No evidence of an acute intracranial abnormality. 2. Parenchymal atrophy, chronic small vessel ischemic disease and unchanged chronic infarcts, as described. Electronically Signed   By: Kellie Childs D.O.   On: 02/18/2024 18:31      Scheduled Meds:  amLODipine   10 mg Oral Daily   aspirin   81 mg Oral Daily   atorvastatin   40 mg Oral QHS   cholecalciferol   2,000 Units Oral Daily   cyanocobalamin   1,000 mcg Oral Daily   enoxaparin  (LOVENOX ) injection  30 mg Subcutaneous Q24H   latanoprost   1 drop Both Eyes QHS   losartan   25 mg Oral Daily   metoprolol  succinate  12.5 mg Oral Daily   pantoprazole   20 mg Oral Daily   potassium chloride   40 mEq Oral Q4H   sodium chloride  flush  3 mL  Intravenous Q12H   sodium chloride  flush  3 mL Intravenous Q12H   timolol   1 drop Both Eyes Daily   Continuous Infusions:  sodium chloride        LOS: 1 day   Time spent: 25 minutes   Delon Hoe, DO [facility] 02/20/2024, 12:17 PM   Available via Epic secure chat 7am-7pm After these hours, please refer to coverage provider listed on [email or web address]

## 2024-02-20 NOTE — TOC Initial Note (Signed)
 Transition of Care [facility]) - Initial/Assessment Note    Patient Details  Name: Kellie Harris MRN: [id number] Date of Birth: [birth year]  Transition of Care [facility]) CM/SW Contact:    Almarie CHRISTELLA Goodie, LCSW Phone Number: 02/20/2024, 2:56 PM  Clinical Narrative:      CSW met with patient's daughter and son at bedside to discuss recommendation for SNF placement. Children in agreement, preference for [facility] as the patient has been there in the past. Family have also been in the process of moving the patient into ALF, will continue efforts to do so in order for patient ot move in after completion of rehab. CSW completed referral and sent to [facility], awaiting response.             Expected Discharge Plan: Skilled Nursing Facility Barriers to Discharge: Continued Medical Work up, English as a second language teacher   Patient Goals and CMS Choice Patient states their goals for this hospitalization and ongoing recovery are:: patient unable to participate in goal setting, not fully oriented CMS Medicare.gov Compare Post Acute Care list provided to:: Patient Represenative (must comment) Choice offered to / list presented to : Adult Children [place] ownership interest in [facility].provided to:: Adult Children    Expected Discharge Plan and Services     Post Acute Care Choice: Skilled Nursing Facility Living arrangements for the past 2 months: Single Family Home                                      Prior Living Arrangements/Services Living arrangements for the past 2 months: Single Family Home Lives with:: Self Patient language and need for interpreter reviewed:: No Do you feel safe going back to the place where you live?: Yes      Need for Family Participation in Patient Care: Yes (Comment) Care giver support system in place?: No (comment) Current home services: DME Criminal Activity/Legal Involvement Pertinent to Current Situation/Hospitalization: No - Comment as  needed  Activities of Daily Living      Permission Sought/Granted Permission sought to share information with : Facility Medical sales representative, Family Supports Permission granted to share information with : Yes, Verbal Permission Granted  Share Information with NAME: [name], Brad  Permission granted to share info w AGENCY: SNF  Permission granted to share info w Relationship: Children     Emotional Assessment Appearance:: Appears stated age Attitude/Demeanor/Rapport: Unable to Assess Affect (typically observed): Unable to Assess Orientation: : Oriented to Place, Oriented to Self, Oriented to Situation Alcohol / Substance Use: Not Applicable Psych Involvement: No (comment)  Admission diagnosis:  Aphasia [R47.01] Patient Active Problem List   Diagnosis Date Noted   Expressive aphasia 02/18/2024   Altered mental status 02/18/2024   Hyponatremia 09/08/2021   Acute blood loss anemia 09/08/2021   Hypokalemia 09/07/2021   History of total hip replacement, left 09/04/2021   Dizziness 01/28/2021   Aphasia 05/03/2020   S/P ORIF (open reduction internal fixation) fracture radius and ulnar fx 04/08/18 04/10/2018   Distal radius and ulna fracture, right    History of CVA (cerebrovascular accident)    Tachycardia    Leukocytosis    Seizures (HCC)    Stroke-like episode (HCC) s/p IV tPA 04/03/2018   Syncope    TIA (transient ischemic attack) 11/04/2017   Malnutrition of moderate degree 08/26/2017   Total knee replacement status 08/07/2017   Closed left hip fracture (HCC) 02/12/2017  Essential hypertension 02/12/2017   HLD (hyperlipidemia) 02/12/2017   PCP:  [name], MD Pharmacy:   [facility] - [place], [place] - [address] [place] [address] Phone: [phone] Fax: [phone]     Social Drivers of Health (SDOH) Social History: SDOH Screenings   Food Insecurity: No Food Insecurity (02/20/2024)  Housing: Low Risk  (02/20/2024)   Transportation Needs: No Transportation Needs (02/20/2024)  Utilities: Not At Risk (02/20/2024)  Financial Resource Strain: Low Risk  (07/17/2023)   Received from [facility]  Physical Activity: Unknown (11/04/2017)  Social Connections: Socially Isolated (02/20/2024)  Stress: No Stress Concern Present (11/04/2017)  Tobacco Use: Low Risk  (02/18/2024)   SDOH Interventions:     Readmission Risk Interventions     No data to display

## 2024-02-21 ENCOUNTER — Encounter (HOSPITAL_COMMUNITY)

## 2024-02-21 DIAGNOSIS — R4701 Aphasia: Secondary | ICD-10-CM | POA: Diagnosis not present

## 2024-02-21 LAB — BASIC METABOLIC PANEL WITH GFR
Anion gap: 11 (ref 5–15)
BUN: 11 mg/dL (ref 8–23)
CO2: 20 mmol/L — ABNORMAL LOW (ref 22–32)
Calcium: 9.3 mg/dL (ref 8.9–10.3)
Chloride: 106 mmol/L (ref 98–111)
Creatinine, Ser: 0.97 mg/dL (ref 0.44–1.00)
GFR, Estimated: 56 mL/min — ABNORMAL LOW (ref >60)
Glucose, Bld: 95 mg/dL (ref 70–99)
Potassium: 4.2 mmol/L (ref 3.5–5.1)
Sodium: 137 mmol/L (ref 135–145)

## 2024-02-21 LAB — CBC
HCT: 42.1 % (ref 36.0–46.0)
Hemoglobin: 13.9 g/dL (ref 12.0–15.0)
MCH: 29.8 pg (ref 26.0–34.0)
MCHC: 33 g/dL (ref 30.0–36.0)
MCV: 90.1 fL (ref 80.0–100.0)
Platelets: 245 K/uL (ref 150–400)
RBC: 4.67 MIL/uL (ref 3.87–5.11)
RDW: 14.6 % (ref 11.5–15.5)
WBC: 12.1 K/uL — ABNORMAL HIGH (ref 4.0–10.5)
nRBC: 0 % (ref 0.0–0.2)

## 2024-02-21 LAB — MAGNESIUM: Magnesium: 2.2 mg/dL (ref 1.7–2.4)

## 2024-02-21 NOTE — Plan of Care (Signed)

## 2024-02-21 NOTE — Evaluation (Signed)
 Speech Language Pathology Evaluation Patient Details Name: Kellie Harris MRN: [id number] DOB: [birth year] Today's Date: 02/21/2024 Time: 9073-9065 SLP Time Calculation (min) (ACUTE ONLY): 8 min  Problem List:  Patient Active Problem List   Diagnosis Date Noted   Expressive aphasia 02/18/2024   Altered mental status 02/18/2024   Hyponatremia 09/08/2021   Acute blood loss anemia 09/08/2021   Hypokalemia 09/07/2021   History of total hip replacement, left 09/04/2021   Dizziness 01/28/2021   Aphasia 05/03/2020   S/P ORIF (open reduction internal fixation) fracture radius and ulnar fx 04/08/18 04/10/2018   Distal radius and ulna fracture, right    History of CVA (cerebrovascular accident)    Tachycardia    Leukocytosis    Seizures (HCC)    Stroke-like episode (HCC) s/p IV tPA 04/03/2018   Syncope    TIA (transient ischemic attack) 11/04/2017   Malnutrition of moderate degree 08/26/2017   Total knee replacement status 08/07/2017   Closed left hip fracture (HCC) 02/12/2017   Essential hypertension 02/12/2017   HLD (hyperlipidemia) 02/12/2017   Past Medical History:  Past Medical History:  Diagnosis Date   Arthritis    right knee, left shoulder/ OSTEO   Cancer (HCC)    skin    Degenerative disc disease, lumbar    L4-L5   Diverticulosis    ITIS   History of hiatal hernia    Hydronephrosis    right   Hypercholesteremia    Hypertension    CONTROLLED ON MEDS   IBS (irritable bowel syndrome)    Knee fracture, right 03/05/2015   DIFFICULTY WITH AMBULATION   Osteopenia    Ovarian cyst    Pancreatic insufficiency    Stroke [facility]) 2025   unsure if TIA or stroke, no deficits   Wears hearing aid    bilateral   Past Surgical History:  Past Surgical History:  Procedure Laterality Date   CATARACT EXTRACTION W/PHACO Right 11/02/2015   Procedure: CATARACT EXTRACTION PHACO AND INTRAOCULAR LENS PLACEMENT (IOC);  Surgeon: Dene Etienne, MD;  Location: [facility];   Service: Ophthalmology;  Laterality: Right;   CATARACT EXTRACTION W/PHACO Left 07/18/2016   Procedure: CATARACT EXTRACTION PHACO AND INTRAOCULAR LENS PLACEMENT (IOC);  Surgeon: Dene Etienne, MD;  Location: [facility];  Service: Ophthalmology;  Laterality: Left;   COLONOSCOPY     ESOPHAGOGASTRODUODENOSCOPY N/A 02/14/2024   Procedure: EGD (ESOPHAGOGASTRODUODENOSCOPY);  Surgeon: Maryruth Ole DASEN, MD;  Location: [facility] ENDOSCOPY;  Service: Endoscopy;  Laterality: N/A;   FRACTURE SURGERY Left    hip   HARDWARE REMOVAL Left 09/04/2021   Procedure: HARDWARE REMOVAL;  Surgeon: Leora Lynwood SAUNDERS, MD;  Location: [facility] ORS;  Service: Orthopedics;  Laterality: Left;   history of left hip fracture     INTRAMEDULLARY (IM) NAIL INTERTROCHANTERIC Left 02/12/2017   Procedure: INTRAMEDULLARY (IM) NAIL INTERTROCHANTRIC;  Surgeon: Cleotilde Barrio, MD;  Location: [facility] ORS;  Service: Orthopedics;  Laterality: Left;   kidney stent     OOPHORECTOMY Bilateral 2015   ORIF WRIST FRACTURE Right 04/08/2018   Procedure: OPEN REDUCTION INTERNAL FIXATION (ORIF) WRIST FRACTURE;  Surgeon: Celena Sharper, MD;  Location: [facility] OR;  Service: Orthopedics;  Laterality: Right;   TONSILLECTOMY     TOTAL HIP ARTHROPLASTY Left 09/04/2021   Procedure: TOTAL HIP ARTHROPLASTY ANTERIOR APPROACH;  Surgeon: Leora Lynwood SAUNDERS, MD;  Location: [facility] ORS;  Service: Orthopedics;  Laterality: Left;   TOTAL KNEE ARTHROPLASTY Right 08/07/2017   Procedure: TOTAL KNEE ARTHROPLASTY;  Surgeon: Cleotilde Barrio, MD;  Location: [facility] ORS;  Service: Orthopedics;  Laterality: Right;   HPI:  [name] is a 89 year old female with history degenerative joint disease, arthritis, hypertension, hypercholesteremia, who presents for chief concern of acute hypoxia at outpatient [facility] orthopedics clinic. 1 MRI in the past positive for a small right frontal infarct, presenting for episode of aphasia-more expressive aphasia than receptive aphasia.  She continues to be  aphasic at this time. MRI done shows a punctate focus of diffusion abnormality concerning for a acute to early subacute ischemic infarct in the high left frontal lobe-that small punctate infarct, especially subacute looking is less likely to have been the cause of her aphasia of sudden onset yesterday at the ER.   Assessment / Plan / Recommendation Clinical Impression  Pt who is hard of hearing assessed for speech-language-cognition without family present. Pt's language appears to have returned to baseline and pt agrees. She states she has had difficulty speaking in the past and has some memory impairment. Today her comprehension was good, able to follow 2 step command. Her language was fluent in conversation. She described picture scene without difficulty, named items on tray and able to name itmes in commom categories accurately. She recalled 2/3 words for recall tasks and correct with semantic cue (retrieval difficulty). She knew she was in a hospital but forgot transferred to [facility]. SHe was not aware of stroke but stated I may have been told. At the end of eval pt was able to recall she had a stroke.Pt's language appears back to baseline and she has history of memory impairments that are not new. Per chart disposition is for ALF/memory care. ST is not recommending further ST at this time. She can be assessed at ALF if needed for family education although this is a chronic deficit.    SLP Assessment  SLP Recommendation/Assessment: Patient does not need any further Speech Language Pathology Services SLP Visit Diagnosis: Cognitive communication deficit (R41.841)     Assistance Recommended at Discharge     Functional Status Assessment Patient has not had a recent decline in their functional status  Frequency and Duration           SLP Evaluation Cognition  Overall Cognitive Status: No family/caregiver present to determine baseline cognitive functioning (suspect memory isssues with disposition  being ALF/memory care) Arousal/Alertness: Awake/alert Orientation Level: Oriented to person;Disoriented to place;Oriented to place (knew she was in hospital- thought [facility]) Attention: Sustained Sustained Attention: Appears intact Memory: Impaired Memory Impairment: Retrieval deficit (2/3 words, recalled she recalled stroke after several minutes) Awareness: Appears intact Problem Solving: Appears intact (functional basic) Safety/Judgment:  (questionable)       Comprehension  Auditory Comprehension Overall Auditory Comprehension: Appears within functional limits for tasks assessed Yes/No Questions: Within Functional Limits (followed 2 step command) Commands: Within Functional Limits Visual Recognition/Discrimination Discrimination: Not tested Reading Comprehension Reading Status: Not tested    Expression Expression Primary Mode of Expression: Verbal Verbal Expression Overall Verbal Expression: Appears within functional limits for tasks assessed Initiation: No impairment Level of Generative/Spontaneous Verbalization: Conversation Repetition:  (NT) Naming: No impairment Pragmatics: No impairment Written Expression Written Expression: Not tested   Oral / Motor  Oral Motor/Sensory Function Overall Oral Motor/Sensory Function: Within functional limits Motor Speech Overall Motor Speech: Appears within functional limits for tasks assessed Respiration: Within functional limits Phonation: Normal Resonance: Within functional limits Articulation: Within functional limitis Intelligibility: Intelligible Motor Planning: Within functional limits            Dustin Olam Bull 02/21/2024, 9:54 AM

## 2024-02-21 NOTE — Progress Notes (Addendum)
 PROGRESS NOTE    Kellie Harris  FMW:[id number] DOB: 09/11/1930 DOA: 02/19/2024 PCP: [name], MD     Brief Narrative:  Kellie Harris is a 89 year old female with degenerative joint disease, arthritis, hypertension, hypercholesterolemia.  [name] was at her outpatient [facility] orthopedic clinic when she was noted to be dizzy and short of breath and was recommended to come to the ED.  Not long after arrival she became acutely aphasic.  Neurology was consulted and patient was admitted for workup.  Patient has had multiple episodes similar to this in the past which have been negative for stroke/TIA workup.  1 prior MRI did reveal a small right frontal infarct.  MRI this admission shows a punctate focus of diffusion abnormality concerning for acute to early subacute ischemic infarct in the left frontal lobe.  Per neurology review this is unlikely to be causing symptoms.  Given consistency of symptoms in the past there is concern for underlying seizure activity.  She was loaded with Keppra  and started on Keppra  500 mg twice daily.  Neurology has recommended long-term EEG to better capture and characterize these spells.  She was transferred to [facility] on 02/19/24.   New events last 24 hours / Subjective: Patient oriented to self, [facility], month of July.  She has no physical complaints this morning and remains pleasant.  Assessment & Plan:  Principal Problem:   Aphasia Active Problems:   Essential hypertension   HLD (hyperlipidemia)   Leukocytosis   Stereotypic episodes of aphasia/speech arrest - Cannot rule out dementia associated speech arrest versus seizure activity.  Multiple similar episodes in the past. - Trial lamotrigine  for 4 weeks - Follow-up with Dr. Lane outpatient, recommend outpatient neurocognitive testing and consideration of ambulatory EEG or planned EMU stay with one-to-one supervision   Leukocytosis - Likely reactive - CXR unremarkable - UA  negative  Subacute punctate left frontal infarct - Aspirin    Arthritis - Pain control    Hypertension - Norvasc , losartan , toprol     Hypercholesterolemia - Lipitor      DVT prophylaxis:  enoxaparin  (LOVENOX ) injection 30 mg Start: 02/19/24 1800  Code Status: DNR Family Communication: None at bedside, called and spoke with daughter today  Disposition Plan: SNF Status is: Inpatient Remains inpatient appropriate because: SNF placement     Antimicrobials:  Anti-infectives (From admission, onward)    None        Objective: Vitals:   02/20/24 2045 02/21/24 0048 02/21/24 0500 02/21/24 0732  BP: (!) 159/86 (!) 157/82 (!) 160/88 135/83  Pulse: 93 87 89 79  Resp: 18 16 16 18   Temp: 97.8 F (36.6 C) 98.3 F (36.8 C) 97.7 F (36.5 C) 98.5 F (36.9 C)  TempSrc: Oral Oral Oral Oral  SpO2: 91% 92% 92% 90%    Intake/Output Summary (Last 24 hours) at 02/21/2024 1105 Last data filed at 02/21/2024 0912 Gross per 24 hour  Intake --  Output 700 ml  Net -700 ml   There were no vitals filed for this visit.  Examination:  General exam: Appears calm and comfortable  Respiratory system: Clear to auscultation. Respiratory effort normal. Cardiovascular system: S1 & S2 heard, RRR.  Gastrointestinal system: Abdomen is nondistended, soft  Central nervous system: Alert and oriented to self and place and month  Extremities: Symmetric in appearance  Skin: No rashes, lesions or ulcers on exposed skin     Data Reviewed: I have personally reviewed following labs and imaging studies  CBC: Recent Labs  Lab 02/18/24 1546 02/18/24 1756 02/19/24 1747 02/20/24 0524 02/21/24 0434  WBC 16.3* 16.0* 14.0* 11.8* 12.1*  NEUTROABS  --  14.1*  --   --   --   HGB 14.9 14.4 13.6 13.7 13.9  HCT 45.8 42.6 40.5 41.3 42.1  MCV 91.2 88.6 89.8 90.8 90.1  PLT 329 324 238 238 245   Basic Metabolic Panel: Recent Labs  Lab 02/18/24 1546 02/20/24 0524 02/21/24 0434  NA 134* 138 137  K  4.3 3.1* 4.2  CL 97* 102 106  CO2 20* 24 20*  GLUCOSE 118* 90 95  BUN 13 10 11   CREATININE 0.95 1.01* 0.97  CALCIUM  10.3 9.4 9.3  MG  --   --  2.2   GFR: Estimated Creatinine Clearance: 26.3 mL/min (by C-G formula based on SCr of 0.97 mg/dL). Liver Function Tests: Recent Labs  Lab 02/18/24 1546  AST 26  ALT 13  ALKPHOS 68  BILITOT 1.4*  PROT 8.2*  ALBUMIN 5.1*   No results for input(s): LIPASE, AMYLASE in the last 168 hours. No results for input(s): AMMONIA in the last 168 hours. Coagulation Profile: Recent Labs  Lab 02/18/24 1756  INR 1.0   Cardiac Enzymes: No results for input(s): CKTOTAL, CKMB, CKMBINDEX, TROPONINI in the last 168 hours. BNP (last 3 results) No results for input(s): PROBNP in the last 8760 hours. HbA1C: Recent Labs    02/19/24 0443  HGBA1C 4.9   CBG: Recent Labs  Lab 02/18/24 1743  GLUCAP 121*   Lipid Profile: Recent Labs    02/19/24 0443  CHOL 143  HDL 49  LDLCALC 74  TRIG 102  CHOLHDL 2.9   Thyroid  Function Tests: No results for input(s): TSH, T4TOTAL, FREET4, T3FREE, THYROIDAB in the last 72 hours. Anemia Panel: No results for input(s): VITAMINB12, FOLATE, FERRITIN, TIBC, IRON, RETICCTPCT in the last 72 hours. Sepsis Labs: No results for input(s): PROCALCITON, LATICACIDVEN in the last 168 hours.  No results found for this or any previous visit (from the past 240 hours).    Radiology Studies: Overnight EEG with video Result Date: 02/20/2024 Shelton Arlin KIDD, MD     02/20/2024 10:56 PM Patient Name: Kellie Harris MRN: [id number] Epilepsy Attending: Arlin KIDD Shelton Referring Physician/Provider: Jerrie Lola CROME, MD Duration: 02/20/2024 9666 to 1352 Patient history: 89yo F with repeated episodes of aphasia. EEG to evaluate for seizure. Level of alertness: Awake, asleep AEDs during EEG study: None Technical aspects: This EEG study was done with scalp electrodes positioned according to the  10-20 International system of electrode placement. Electrical activity was reviewed with band pass filter of 1-70Hz , sensitivity of 7 uV/mm, display speed of 64mm/sec with a 60Hz  notched filter applied as appropriate. EEG data were recorded continuously and digitally stored.  Video monitoring was available and reviewed as appropriate. Description: The posterior dominant rhythm consists of 8 Hz activity of moderate voltage (25-35 uV) seen predominantly in posterior head regions, symmetric and reactive to eye opening and eye closing. Sleep was characterized by vertex waves, sleep spindles (12 to 14 Hz), maximal frontocentral region.  EEG showed intermittent generalized and lateralized left hemisphere 3-5hz  theta- delta slowing. Hyperventilation and photic stimulation were not performed.   EEG was not interpretable between 0618 to 0817 and after 1230 due to significant electrode artifact. ABNORMALITY - Intermittent slow, generalized and lateralized left hemisphere IMPRESSION: This study is suggestive of nonspecific cortical dysfunction in left hemisphere as well as mild diffuse encephalopathy.  No seizures or epileptiform discharges were seen  throughout the recording. Arlin MALVA Krebs   ECHOCARDIOGRAM COMPLETE Result Date: 02/19/2024    ECHOCARDIOGRAM REPORT   Patient Name:   TWILIA YAKLIN Narez Date of Exam: 02/19/2024 Medical Rec #:  [id number]   Height:       59.0 in Accession #:    [id number]  Weight:       105.8 lb Date of Birth:  [birth year]    BSA:          1.407 m Patient Age:    89 years    BP:           148/91 mmHg Patient Gender: F           HR:           81 bpm. Exam Location:  [facility] Procedure: 2D Echo, Color Doppler and Cardiac Doppler (Both Spectral and Color            Flow Doppler were utilized during procedure). Indications:     Stroke I63.9  History:         Patient has prior history of Echocardiogram examinations, most                  recent 01/30/2021. Stroke; Arrythmias:LBBB.  Sonographer:     Ashley  McNeely-Sloane Referring Phys:  [id number] AMY N COX Diagnosing Phys: Dwayne D Callwood MD  Sonographer Comments: Image acquisition challenging due to uncooperative patient. IMPRESSIONS  1. Left ventricular ejection fraction, by estimation, is 60 to 65%. The left ventricle has normal function. The left ventricle has no regional wall motion abnormalities. Left ventricular diastolic parameters are consistent with Grade I diastolic dysfunction (impaired relaxation).  2. Right ventricular systolic function is normal. The right ventricular size is normal.  3. The mitral valve is normal in structure. Trivial mitral valve regurgitation.  4. The aortic valve is normal in structure. Aortic valve regurgitation is mild to moderate. FINDINGS  Left Ventricle: Left ventricular ejection fraction, by estimation, is 60 to 65%. The left ventricle has normal function. The left ventricle has no regional wall motion abnormalities. Strain was performed and the global longitudinal strain is indeterminate. Global longitudinal strain performed but not reported based on interpreter judgement due to suboptimal tracking. The left ventricular internal cavity size was normal in size. There is no left ventricular hypertrophy. Left ventricular diastolic parameters are consistent with Grade I diastolic dysfunction (impaired relaxation). Right Ventricle: The right ventricular size is normal. No increase in right ventricular wall thickness. Right ventricular systolic function is normal. Left Atrium: Left atrial size was normal in size. Right Atrium: Right atrial size was normal in size. Pericardium: There is no evidence of pericardial effusion. Mitral Valve: The mitral valve is normal in structure. Trivial mitral valve regurgitation. Tricuspid Valve: The tricuspid valve is normal in structure. Tricuspid valve regurgitation is mild. Aortic Valve: The aortic valve is normal in structure. Aortic valve regurgitation is mild to moderate. Aortic regurgitation  PHT measures 364 msec. Aortic valve mean gradient measures 6.0 mmHg. Aortic valve peak gradient measures 11.3 mmHg. Aortic valve area, by VTI measures 2.09 cm. Pulmonic Valve: The pulmonic valve was grossly normal. Pulmonic valve regurgitation is not visualized. Aorta: The ascending aorta was not well visualized. IAS/Shunts: No atrial level shunt detected by color flow Doppler. Additional Comments: 3D was performed not requiring image post processing on an independent workstation and was indeterminate.  LEFT VENTRICLE PLAX 2D LVIDd:         3.50 cm     Diastology LVIDs:  1.70 cm     LV e' medial:    3.37 cm/s LV PW:         0.80 cm     LV E/e' medial:  19.8 LV IVS:        0.70 cm     LV e' lateral:   6.31 cm/s LVOT diam:     1.90 cm     LV E/e' lateral: 10.6 LV SV:         61 LV SV Index:   43 LVOT Area:     2.84 cm  LV Volumes (MOD) LV vol d, MOD A2C: 27.3 ml LV vol d, MOD A4C: 47.2 ml LV vol s, MOD A2C: 14.3 ml LV vol s, MOD A4C: 18.6 ml LV SV MOD A2C:     13.0 ml LV SV MOD A4C:     47.2 ml LV SV MOD BP:      21.1 ml RIGHT VENTRICLE             IVC RV Basal diam:  4.10 cm     IVC diam: 1.30 cm RV S prime:     11.10 cm/s TAPSE (M-mode): 1.3 cm LEFT ATRIUM           Index        RIGHT ATRIUM           Index LA diam:      2.70 cm 1.92 cm/m   RA Area:     14.30 cm LA Vol (A2C): 25.7 ml 18.26 ml/m  RA Volume:   34.20 ml  24.30 ml/m LA Vol (A4C): 11.0 ml 7.82 ml/m  AORTIC VALVE                     PULMONIC VALVE AV Area (Vmax):    1.94 cm      PV Vmax:        0.79 m/s AV Area (Vmean):   1.79 cm      PV Vmean:       54.100 cm/s AV Area (VTI):     2.09 cm      PV VTI:         0.155 m AV Vmax:           168.00 cm/s   PV Peak grad:   2.5 mmHg AV Vmean:          113.000 cm/s  PV Mean grad:   1.0 mmHg AV VTI:            0.292 m       RVOT Peak grad: 1 mmHg AV Peak Grad:      11.3 mmHg AV Mean Grad:      6.0 mmHg LVOT Vmax:         115.00 cm/s LVOT Vmean:        71.300 cm/s LVOT VTI:          0.215 m LVOT/AV  VTI ratio: 0.74 AI PHT:            364 msec  AORTA Ao Root diam: 3.10 cm Ao Asc diam:  2.40 cm MITRAL VALVE                TRICUSPID VALVE MV Area (PHT): 2.95 cm     TR Peak grad:   26.8 mmHg MV Decel Time: 257 msec     TR Mean grad:   21.0 mmHg MV E velocity: 66.60 cm/s   TR Vmax:  259.00 cm/s MV A velocity: 118.00 cm/s  TR Vmean:       224.0 cm/s MV E/A ratio:  0.56                             SHUNTS                             Systemic VTI:  0.22 m                             Systemic Diam: 1.90 cm                             Pulmonic VTI:  0.088 m Cara JONETTA Lovelace MD Electronically signed by Cara JONETTA Lovelace MD Signature Date/Time: 02/19/2024/9:02:47 PM    Final       Scheduled Meds:  amLODipine   10 mg Oral Daily   aspirin   81 mg Oral Daily   atorvastatin   40 mg Oral QHS   cholecalciferol   2,000 Units Oral Daily   cyanocobalamin   1,000 mcg Oral Daily   enoxaparin  (LOVENOX ) injection  30 mg Subcutaneous Q24H   lamoTRIgine   25 mg Oral QHS   Followed by   NOREEN ON 03/05/2024] lamoTRIgine   25 mg Oral BID   latanoprost   1 drop Both Eyes QHS   losartan   25 mg Oral Daily   metoprolol  succinate  12.5 mg Oral Daily   pantoprazole   20 mg Oral Daily   sodium chloride  flush  3 mL Intravenous Q12H   sodium chloride  flush  3 mL Intravenous Q12H   timolol   1 drop Both Eyes Daily   Continuous Infusions:     LOS: 2 days   Time spent: 25 minutes   Delon Hoe, DO [facility] 02/21/2024, 11:05 AM   Available via Epic secure chat 7am-7pm After these hours, please refer to coverage provider listed on [email or web address]

## 2024-02-21 NOTE — TOC Progression Note (Signed)
 Transition of Care [facility]) - Progression Note    Patient Details  Name: Kellie Harris MRN: [id number] Date of Birth: [birth year]  [facility] CM/SW Contact  Almarie CHRISTELLA Goodie, [place] Phone Number: 02/21/2024, 3:01 PM  Clinical Narrative:   CSW confirmed with [facility] that they can offer a bed. CSW contacted [employer] to initiate insurance authorization request, awaiting approval. CSW to follow.    Expected Discharge Plan: Skilled Nursing Facility Barriers to Discharge: Continued Medical Work up, English as a second language teacher  Expected Discharge Plan and Services     Post Acute Care Choice: Skilled Nursing Facility Living arrangements for the past 2 months: Single Family Home                                       Social Determinants of Health (SDOH) Interventions SDOH Screenings   Food Insecurity: No Food Insecurity (02/20/2024)  Housing: Low Risk  (02/20/2024)  Transportation Needs: No Transportation Needs (02/20/2024)  Utilities: Not At Risk (02/20/2024)  Financial Resource Strain: Low Risk  (07/17/2023)   Received from [facility]  Physical Activity: Unknown (11/04/2017)  Social Connections: Socially Isolated (02/20/2024)  Stress: No Stress Concern Present (11/04/2017)  Tobacco Use: Low Risk  (02/18/2024)    Readmission Risk Interventions     No data to display

## 2024-02-22 DIAGNOSIS — R569 Unspecified convulsions: Secondary | ICD-10-CM | POA: Diagnosis not present

## 2024-02-22 DIAGNOSIS — K219 Gastro-esophageal reflux disease without esophagitis: Secondary | ICD-10-CM | POA: Diagnosis not present

## 2024-02-22 DIAGNOSIS — I10 Essential (primary) hypertension: Secondary | ICD-10-CM | POA: Diagnosis not present

## 2024-02-22 DIAGNOSIS — E785 Hyperlipidemia, unspecified: Secondary | ICD-10-CM | POA: Diagnosis not present

## 2024-02-22 DIAGNOSIS — R41841 Cognitive communication deficit: Secondary | ICD-10-CM | POA: Diagnosis not present

## 2024-02-22 DIAGNOSIS — Z8673 Personal history of transient ischemic attack (TIA), and cerebral infarction without residual deficits: Secondary | ICD-10-CM | POA: Diagnosis not present

## 2024-02-22 DIAGNOSIS — I2510 Atherosclerotic heart disease of native coronary artery without angina pectoris: Secondary | ICD-10-CM | POA: Diagnosis not present

## 2024-02-22 DIAGNOSIS — D509 Iron deficiency anemia, unspecified: Secondary | ICD-10-CM | POA: Diagnosis not present

## 2024-02-22 DIAGNOSIS — M6259 Muscle wasting and atrophy, not elsewhere classified, multiple sites: Secondary | ICD-10-CM | POA: Diagnosis not present

## 2024-02-22 DIAGNOSIS — R52 Pain, unspecified: Secondary | ICD-10-CM | POA: Diagnosis not present

## 2024-02-22 DIAGNOSIS — E569 Vitamin deficiency, unspecified: Secondary | ICD-10-CM | POA: Diagnosis not present

## 2024-02-22 DIAGNOSIS — R4701 Aphasia: Secondary | ICD-10-CM | POA: Diagnosis not present

## 2024-02-22 DIAGNOSIS — D72829 Elevated white blood cell count, unspecified: Secondary | ICD-10-CM | POA: Diagnosis not present

## 2024-02-22 DIAGNOSIS — K588 Other irritable bowel syndrome: Secondary | ICD-10-CM | POA: Diagnosis not present

## 2024-02-22 DIAGNOSIS — R7981 Abnormal blood-gas level: Secondary | ICD-10-CM | POA: Diagnosis not present

## 2024-02-22 DIAGNOSIS — E871 Hypo-osmolality and hyponatremia: Secondary | ICD-10-CM | POA: Diagnosis not present

## 2024-02-22 LAB — CBC
HCT: 39.4 % (ref 36.0–46.0)
Hemoglobin: 13 g/dL (ref 12.0–15.0)
MCH: 29.9 pg (ref 26.0–34.0)
MCHC: 33 g/dL (ref 30.0–36.0)
MCV: 90.6 fL (ref 80.0–100.0)
Platelets: 236 K/uL (ref 150–400)
RBC: 4.35 MIL/uL (ref 3.87–5.11)
RDW: 14.5 % (ref 11.5–15.5)
WBC: 14.2 K/uL — ABNORMAL HIGH (ref 4.0–10.5)
nRBC: 0 % (ref 0.0–0.2)

## 2024-02-22 MED ORDER — METOPROLOL SUCCINATE ER 25 MG PO TB24
12.5000 mg | ORAL_TABLET | Freq: Every day | ORAL | Status: DC
Start: 2024-02-23 — End: 2024-06-23

## 2024-02-22 MED ORDER — LAMOTRIGINE 25 MG PO TABS
ORAL_TABLET | ORAL | Status: DC
Start: 2024-02-22 — End: 2024-06-23

## 2024-02-22 MED ORDER — ASPIRIN 81 MG PO CHEW: 81.0000 mg | CHEWABLE_TABLET | Freq: Every day | ORAL | Status: DC

## 2024-02-22 NOTE — Plan of Care (Signed)
 Pt is comfortably resting, assisted when ambulating to and from the bathroom. No complaints noted. Problem: Education: Goal: Knowledge of General Education information will improve Description: Including pain rating scale, medication(s)/side effects and non-pharmacologic comfort measures Outcome: Progressing   Problem: Health Behavior/Discharge Planning: Goal: Ability to manage health-related needs will improve Outcome: Progressing   Problem: Clinical Measurements: Goal: Will remain free from infection Outcome: Progressing   Problem: Activity: Goal: Risk for activity intolerance will decrease Outcome: Progressing   Problem: Nutrition: Goal: Adequate nutrition will be maintained Outcome: Progressing   Problem: Safety: Goal: Ability to remain free from injury will improve Outcome: Progressing   Problem: Skin Integrity: Goal: Risk for impaired skin integrity will decrease Outcome: Progressing

## 2024-02-22 NOTE — Discharge Summary (Signed)
 Physician Discharge Summary  Kellie Harris DOB: June 28, 1931 DOA: 02/19/2024  PCP: [name], MD  Admit date: 02/19/2024 Discharge date: 02/22/2024  Admitted From: Home Disposition:  SNF  Recommendations for Outpatient Follow-up:  Follow up with Neurology. Follow-up with Dr. Lane outpatient, recommend outpatient neurocognitive testing and consideration of ambulatory EEG or planned EMU stay with one-to-one supervision Follow up CBC to check leukocytosis outpatient   Discharge Condition: Stable CODE STATUS: DNR  Diet recommendation: Regular   Brief/Interim Summary: Kellie Harris is a 89 year old female with degenerative joint disease, arthritis, hypertension, hypercholesterolemia.  Kellie Harris was at her outpatient [facility] orthopedic clinic when she was noted to be dizzy and short of breath and was recommended to come to the ED.  Not long after arrival she became acutely aphasic.  Neurology was consulted and patient was admitted for workup.   Patient has had multiple episodes similar to this in the past which have been negative for stroke/TIA workup.  1 prior MRI did reveal a small right frontal infarct.  MRI this admission shows a punctate focus of diffusion abnormality concerning for acute to early subacute ischemic infarct in the left frontal lobe.  Per neurology review this is unlikely to be causing symptoms.  Given consistency of symptoms in the past there is concern for underlying seizure activity.  She was loaded with Keppra  and started on Keppra  500 mg twice daily.  Neurology has recommended long-term EEG to better capture and characterize these spells.  She was transferred to [facility] on 02/19/24.   She was unable to complete continuous EEG as she kept taking off the leads. Neurology has recommended lamotrigine  and outpatient follow up.   Discharge Diagnoses:   Principal Problem:   Aphasia Active Problems:   Essential hypertension   HLD (hyperlipidemia)    Leukocytosis  Stereotypic episodes of aphasia/speech arrest - Cannot rule out dementia associated speech arrest versus seizure activity.  Multiple similar episodes in the past. - Trial lamotrigine  for 4 weeks    Morning  Night  Week 1 and 2   none   25 mg   Week 3 and 4   25 mg    25 mg  Week 5   25 mg   50 mg  Week 6   50 mg   75 mg  Week 7   75 mg 100 mg  Week 8  100 mg 125 mg  - Follow-up with Dr. Lane outpatient, recommend outpatient neurocognitive testing and consideration of ambulatory EEG or planned EMU stay with one-to-one supervision   Leukocytosis - Likely reactive - CXR unremarkable - UA negative - Afebrile and no sign of infectious process    Subacute punctate left frontal infarct - Aspirin    Arthritis - Pain control    Hypertension - Norvasc , losartan , toprol     Hypercholesterolemia - Lipitor   Discharge Instructions  Discharge Instructions     Increase activity slowly   Complete by: As directed       Allergies as of 02/22/2024   No Known Allergies      Medication List     STOP taking these medications    levETIRAcetam  500 MG tablet Commonly known as: KEPPRA        TAKE these medications    acetaminophen  500 MG tablet Commonly known as: TYLENOL  Take 1,000 mg by mouth every 6 (six) hours as needed for mild pain (pain score 1-3).   amLODipine  10 MG tablet Commonly known as: NORVASC  Take 10 mg  by mouth daily.   aspirin  81 MG chewable tablet Chew 1 tablet (81 mg total) by mouth daily. Start taking on: February 23, 2024 What changed: when to take this   atorvastatin  40 MG tablet Commonly known as: LIPITOR Take 40 mg by mouth every evening.   cyanocobalamin  1000 MCG tablet Take 1 tablet (1,000 mcg total) by mouth daily.   lamoTRIgine  25 MG tablet Commonly known as: LAMICTAL  Take 1 tablet (25 mg total) by mouth at bedtime for 12 days, THEN 1 tablet (25 mg total) 2 (two) times daily for 14 days. Start taking on: February 22, 2024    latanoprost  0.005 % ophthalmic solution Commonly known as: XALATAN  Place 1 drop into both eyes at bedtime.   losartan  25 MG tablet Commonly known as: COZAAR  Take 25 mg by mouth daily.   metoprolol  succinate 25 MG 24 hr tablet Commonly known as: TOPROL -XL Take 0.5 tablets (12.5 mg total) by mouth daily. Start taking on: February 23, 2024 What changed: how much to take   pantoprazole  20 MG tablet Commonly known as: Protonix  Take 1 tablet (20 mg total) by mouth daily.   timolol  0.5 % ophthalmic solution Commonly known as: TIMOPTIC  Apply 1 drop to eye.  Place 1 drop into both eyes once daily   Vitamin D  50 MCG (2000 UT) tablet Take 2,000 Units by mouth daily.        Follow-up Information     Lane Arthea BRAVO, MD Follow up.   Specialty: Neurology Contact information: [phone] [facility] [address] [facility] [place] [address] [phone]         [name], MD Follow up.   Specialty: Family Medicine Contact information: [address] [place] [address] [phone]                No Known Allergies    Procedures/Studies: Overnight EEG with video Result Date: 02/20/2024 Shelton Arlin KIDD, MD     02/20/2024 10:56 PM Patient Name: Kellie Harris Epilepsy Attending: Arlin KIDD Shelton Referring Physician/Provider: Jerrie Lola CROME, MD Duration: 02/20/2024 [id number] to [id number] Patient history: 89yo F with repeated episodes of aphasia. EEG to evaluate for seizure. Level of alertness: Awake, asleep AEDs during EEG study: None Technical aspects: This EEG study was done with scalp electrodes positioned according to the 10-20 International system of electrode placement. Electrical activity was reviewed with band pass filter of 1-70Hz , sensitivity of 7 uV/mm, display speed of 50mm/sec with a 60Hz  notched filter applied as appropriate. EEG data were recorded continuously and digitally stored.  Video monitoring was available and reviewed  as appropriate. Description: The posterior dominant rhythm consists of 8 Hz activity of moderate voltage (25-35 uV) seen predominantly in posterior head regions, symmetric and reactive to eye opening and eye closing. Sleep was characterized by vertex waves, sleep spindles (12 to 14 Hz), maximal frontocentral region.  EEG showed intermittent generalized and lateralized left hemisphere 3-5hz  theta- delta slowing. Hyperventilation and photic stimulation were not performed.   EEG was not interpretable between 0618 to 0817 and after 1230 due to significant electrode artifact. ABNORMALITY - Intermittent slow, generalized and lateralized left hemisphere IMPRESSION: This study is suggestive of nonspecific cortical dysfunction in left hemisphere as well as mild diffuse encephalopathy.  No seizures or epileptiform discharges were seen throughout the recording. Arlin KIDD Shelton   ECHOCARDIOGRAM COMPLETE Result Date: 02/19/2024    ECHOCARDIOGRAM REPORT   Patient Name:   Kellie Harris Kellie Harris Date of Exam: 02/19/2024 Medical Rec #:  Harris   Height:       59.0 in Accession #:    [id number]  Weight:       105.8 lb Date of Birth:  1931/04/06    BSA:          1.407 m Patient Age:    89 years    BP:           148/91 mmHg Patient Gender: F           HR:           81 bpm. Exam Location:  [facility] Procedure: 2D Echo, Color Doppler and Cardiac Doppler (Both Spectral and Color            Flow Doppler were utilized during procedure). Indications:     Stroke I63.9  History:         Patient has prior history of Echocardiogram examinations, most                  recent 01/30/2021. Stroke; Arrythmias:LBBB.  Sonographer:     Ashley McNeely-Sloane Referring Phys:  [id number] AMY N COX Diagnosing Phys: Dwayne D Callwood MD  Sonographer Comments: Image acquisition challenging due to uncooperative patient. IMPRESSIONS  1. Left ventricular ejection fraction, by estimation, is 60 to 65%. The left ventricle has normal function. The left ventricle has no regional  wall motion abnormalities. Left ventricular diastolic parameters are consistent with Grade I diastolic dysfunction (impaired relaxation).  2. Right ventricular systolic function is normal. The right ventricular size is normal.  3. The mitral valve is normal in structure. Trivial mitral valve regurgitation.  4. The aortic valve is normal in structure. Aortic valve regurgitation is mild to moderate. FINDINGS  Left Ventricle: Left ventricular ejection fraction, by estimation, is 60 to 65%. The left ventricle has normal function. The left ventricle has no regional wall motion abnormalities. Strain was performed and the global longitudinal strain is indeterminate. Global longitudinal strain performed but not reported based on interpreter judgement due to suboptimal tracking. The left ventricular internal cavity size was normal in size. There is no left ventricular hypertrophy. Left ventricular diastolic parameters are consistent with Grade I diastolic dysfunction (impaired relaxation). Right Ventricle: The right ventricular size is normal. No increase in right ventricular wall thickness. Right ventricular systolic function is normal. Left Atrium: Left atrial size was normal in size. Right Atrium: Right atrial size was normal in size. Pericardium: There is no evidence of pericardial effusion. Mitral Valve: The mitral valve is normal in structure. Trivial mitral valve regurgitation. Tricuspid Valve: The tricuspid valve is normal in structure. Tricuspid valve regurgitation is mild. Aortic Valve: The aortic valve is normal in structure. Aortic valve regurgitation is mild to moderate. Aortic regurgitation PHT measures 364 msec. Aortic valve mean gradient measures 6.0 mmHg. Aortic valve peak gradient measures 11.3 mmHg. Aortic valve area, by VTI measures 2.09 cm. Pulmonic Valve: The pulmonic valve was grossly normal. Pulmonic valve regurgitation is not visualized. Aorta: The ascending aorta was not well visualized.  IAS/Shunts: No atrial level shunt detected by color flow Doppler. Additional Comments: 3D was performed not requiring image post processing on an independent workstation and was indeterminate.  LEFT VENTRICLE PLAX 2D LVIDd:         3.50 cm     Diastology LVIDs:         1.70 cm     LV e' medial:    3.37 cm/s LV PW:         0.80 cm  LV E/e' medial:  19.8 LV IVS:        0.70 cm     LV e' lateral:   6.31 cm/s LVOT diam:     1.90 cm     LV E/e' lateral: 10.6 LV SV:         61 LV SV Index:   43 LVOT Area:     2.84 cm  LV Volumes (MOD) LV vol d, MOD A2C: 27.3 ml LV vol d, MOD A4C: 47.2 ml LV vol s, MOD A2C: 14.3 ml LV vol s, MOD A4C: 18.6 ml LV SV MOD A2C:     13.0 ml LV SV MOD A4C:     47.2 ml LV SV MOD BP:      21.1 ml RIGHT VENTRICLE             IVC RV Basal diam:  4.10 cm     IVC diam: 1.30 cm RV S prime:     11.10 cm/s TAPSE (M-mode): 1.3 cm LEFT ATRIUM           Index        RIGHT ATRIUM           Index LA diam:      2.70 cm 1.92 cm/m   RA Area:     14.30 cm LA Vol (A2C): 25.7 ml 18.26 ml/m  RA Volume:   34.20 ml  24.30 ml/m LA Vol (A4C): 11.0 ml 7.82 ml/m  AORTIC VALVE                     PULMONIC VALVE AV Area (Vmax):    1.94 cm      PV Vmax:        0.79 m/s AV Area (Vmean):   1.79 cm      PV Vmean:       54.100 cm/s AV Area (VTI):     2.09 cm      PV VTI:         0.155 m AV Vmax:           168.00 cm/s   PV Peak grad:   2.5 mmHg AV Vmean:          113.000 cm/s  PV Mean grad:   1.0 mmHg AV VTI:            0.292 m       RVOT Peak grad: 1 mmHg AV Peak Grad:      11.3 mmHg AV Mean Grad:      6.0 mmHg LVOT Vmax:         115.00 cm/s LVOT Vmean:        71.300 cm/s LVOT VTI:          0.215 m LVOT/AV VTI ratio: 0.74 AI PHT:            364 msec  AORTA Ao Root diam: 3.10 cm Ao Asc diam:  2.40 cm MITRAL VALVE                TRICUSPID VALVE MV Area (PHT): 2.95 cm     TR Peak grad:   26.8 mmHg MV Decel Time: 257 msec     TR Mean grad:   21.0 mmHg MV E velocity: 66.60 cm/s   TR Vmax:        259.00 cm/s MV A velocity:  118.00 cm/s  TR Vmean:       224.0 cm/s MV E/A ratio:  0.56  SHUNTS                             Systemic VTI:  0.22 m                             Systemic Diam: 1.90 cm                             Pulmonic VTI:  0.088 m Cara JONETTA Lovelace MD Electronically signed by Cara JONETTA Lovelace MD Signature Date/Time: 02/19/2024/9:02:47 PM    Final    MR BRAIN WO CONTRAST Result Date: 02/19/2024 CLINICAL DATA:  Initial evaluation for acute neuro deficit, stroke suspected. EXAM: MRI HEAD WITHOUT CONTRAST TECHNIQUE: Multiplanar, multiecho pulse sequences of the brain and surrounding structures were obtained without intravenous contrast. COMPARISON:  CTs from earlier the same day. FINDINGS: Brain: Examination degraded by motion artifact. Generalized age-related cerebral atrophy. Patchy T2/FLAIR hyperintensity involving the periventricular and deep white matter, consistent with chronic small vessel ischemic disease, moderately advanced in nature. Few scatter remote lacunar infarcts present about the right frontal corona radiata and thalami. Small remote cerebellar infarcts, right greater than left. w single punctate focus of mild diffusion signal abnormality seen involving the high left frontal lobe (series 5, image 46), suspicious for a small acute to early subacute ischemic infarct. Initial this is not well seen on corresponding coronal DWI sequence. No associated hemorrhage. No other evidence for acute or subacute ischemia. Gray-white matter differentiation otherwise maintained. No acute intracranial hemorrhage. Few punctate chronic micro hemorrhages noted within the right cerebellum and left thalamus. No mass lesion, midline shift or mass effect. Mild ventricular prominence related global parenchymal volume loss without hydrocephalus. No extra-axial fluid collection. Pituitary gland within normal limits. Vascular: Major intracranial vascular flow voids are maintained. Skull and upper cervical spine:  Craniocervical junction within normal limits. Bone marrow signal intensity within normal limits. No scalp soft tissue abnormality. Sinuses/Orbits: Globes orbital soft tissues demonstrate no acute finding. Prior bilateral ocular lens replacement. Paranasal sinuses are largely clear. No significant mastoid effusion. Other: None. IMPRESSION: 1. Single punctate focus of diffusion signal abnormality involving the high left frontal lobe, suspicious for a small acute to early subacute ischemic infarct. No associated hemorrhage. 2. No other acute intracranial abnormality. 3. Age-related cerebral atrophy with moderate chronic small vessel ischemic disease, with a few scattered remote lacunar infarcts involving the right frontal corona radiata, thalami, and cerebellum. Electronically Signed   By: Morene Hoard M.D.   On: 02/19/2024 00:26   DG Chest Port 1 View Result Date: 02/18/2024 CLINICAL DATA:  Altered mental status, leukocytosis EXAM: PORTABLE CHEST 1 VIEW COMPARISON:  09/07/2021 FINDINGS: Heart and mediastinal contours are within normal limits. No focal opacities or effusions. No acute bony abnormality. Aortic atherosclerosis. IMPRESSION: No active disease. Electronically Signed   By: Franky Crease M.D.   On: 02/18/2024 20:38   CT ANGIO HEAD NECK W WO CM (CODE STROKE) Result Date: 02/18/2024 CLINICAL DATA:  Provided history: Stroke, follow-up. EXAM: CT ANGIOGRAPHY HEAD AND NECK WITH AND WITHOUT CONTRAST TECHNIQUE: Multidetector CT imaging of the head and neck was performed using the standard protocol during bolus administration of intravenous contrast. Multiplanar CT image reconstructions and MIPs were obtained to evaluate the vascular anatomy. Carotid stenosis measurements (when applicable) are obtained utilizing NASCET criteria, using the distal internal carotid diameter as the  denominator. RADIATION DOSE REDUCTION: This exam was performed according to the departmental dose-optimization program which  includes automated exposure control, adjustment of the mA and/or kV according to patient size and/or use of iterative reconstruction technique. CONTRAST:  75mL OMNIPAQUE  IOHEXOL  350 MG/ML SOLN COMPARISON:  Noncontrast head CT performed earlier today 02/18/2024. CT angiogram head/neck 04/03/2018. FINDINGS: CTA NECK FINDINGS Aortic arch: Standard aortic branching. Atherosclerotic plaque within the visible thoracic aorta and proximal major branch vessels of the neck. Atherosclerotic plaque within the visible thoracic aorta and proximal major branch vessels of the neck. Streak/beam hardening artifact arising from a dense contrast bolus partially obscures the right subclavian artery. Within this limitation, there is no appreciable hemodynamically significant innominate or proximal subclavian artery stenosis. Right carotid system: CCA and ICA patent within the neck without stenosis. Mild atherosclerotic plaque about the carotid bifurcation. Left carotid system: CCA and ICA patent within the neck without measurable stenosis. Nonstenotic atherosclerotic plaque at the CCA origin, about the carotid bifurcation and within the proximal ICA. Vertebral arteries: Patent within the neck. The right vertebral artery is slightly dominant. Nonstenotic calcified plaque at the right vertebral artery origin and within the right V3 segment. Nonstenotic calcified plaque within the proximal left V3 segment. Atherosclerotic plaque within the left vertebral artery at the V3/V4 junction which has progressed and now results in at least moderate stenosis. Skeleton: Nonspecific reversal of the expected cervical doses. Grade 1 anterolisthesis at C2-C3. Cervical spondylosis and multilevel vertebral ankylosis. No acute fracture or aggressive osseous lesion. Other neck: No neck mass or cervical lymphadenopathy. Upper chest: No consolidation within the imaged lung apices. Review of the MIP images confirms the above findings CTA HEAD FINDINGS Anterior  circulation: The intracranial internal carotid arteries are patent. Nonstenotic calcified plaque within both vessels The M1 middle cerebral arteries are patent. No M2 proximal branch occlusion or high-grade proximal stenosis. The anterior cerebral arteries are patent. New sites of up to moderate stenosis within the left anterior cerebral artery A4 segment. Unchanged 3 mm ventrally projecting vascular protrusion arising from the right middle cerebral artery distal M1 segment, which may reflect an aneurysm or infundibulum. Posterior circulation: The intracranial vertebral arteries are patent. Nonstenotic atherosclerotic plaque within the intracranial right vertebral atherosclerotic plaque within the intracranial right vertebral artery with no more than mild stenosis. Progressive atherosclerotic plaque within the left vertebral artery at the V3/V4 junction, now with at least moderate stenosis. The basilar artery is patent. The posterior cerebral arteries are patent. Moderate stenosis within the right PCA P2 segment, new from the prior exam. Moderate to severe stenosis within the left PCA at the P2/P3 junction, also new from the prior exam. Posterior communicating arteries are diminutive or absent, bilaterally. Venous sinuses: Contrast timing precludes evaluation for dural venous sinus thrombosis. Anatomic variants: As described. Review of the MIP images confirms the above findings No emergent large vessel occlusion identified. These results were communicated to Dr. Voncile at 7:34 pmon 7/8/2025by text page via the [facility] messaging system. IMPRESSION: CTA neck: 1. The common carotid and internal carotid arteries are patent within the neck without stenosis. Mild atherosclerotic plaque bilaterally, as described. 2. The vertebral arteries are patent within the neck. Atherosclerotic plaque within the left vertebral artery at the V3/V4 junction, progressed from the prior CTA of 04/03/2018 and now resulting in at least moderate  stenosis. Non-stenotic atherosclerotic plaque elsewhere within bilateral cervical vertebral arteries. 3. Aortic Atherosclerosis (ICD10-I70.0). CTA head: 1. No proximal intracranial large vessel occlusion identified. 2. Intracranial atherosclerotic disease with multifocal  stenoses, most notably as follows. 3. Atherosclerotic plaque within the left vertebral artery at the V3/V4 junction, progressed from the prior CTA of 04/03/2018 and now resulting in at least moderate stenosis. 4. New moderate-to-severe stenosis within the left posterior cerebral artery at the P3/P4 junction. 5. New moderate stenosis within the right PCA P2 segment. 6. New sites of up to moderate stenosis within the left anterior cerebral artery A4 segment. 7. Unchanged 3 mm vascular protrusion arising from the right middle cerebral artery distal M1 segment, which may reflect an aneurysm or infundibulum. Electronically Signed   By: Rockey Childs D.O.   On: 02/18/2024 19:34   CT HEAD CODE STROKE WO CONTRAST Result Date: 02/18/2024 CLINICAL DATA:  Code stroke. Neuro deficit, acute, stroke suspected. Shortness of breath. Dizziness. EXAM: CT HEAD WITHOUT CONTRAST TECHNIQUE: Contiguous axial images were obtained from the base of the skull through the vertex without intravenous contrast. RADIATION DOSE REDUCTION: This exam was performed according to the departmental dose-optimization program which includes automated exposure control, adjustment of the mA and/or kV according to patient size and/or use of iterative reconstruction technique. COMPARISON:  Head CT 02/06/2024. Brain MRI 06/01/2022. FINDINGS: Brain: Generalized cerebral atrophy. Known chronic infarcts within the right corona radiata, thalami, left occipital lobe and bilateral cerebellar hemispheres, some of which were better appreciated on the prior brain MRI of 06/01/2022. Background advanced patchy and ill-defined hypoattenuation within the cerebral white matter, nonspecific but compatible  chronic small vessel fall ischemic disease. There is no acute intracranial hemorrhage. No acute demarcated cortical infarct. No extra-axial fluid collection. No evidence of an intracranial mass. No midline shift. Vascular: No hyperdense vessel. Atherosclerotic calcifications. Skull: No calvarial fracture or aggressive osseous lesion. Sinuses/Orbits: No mass or acute finding within the imaged orbits. No significant paranasal sinus disease at the imaged levels. ASPECTS [facility] Stroke Program Early CT Score) - Ganglionic level infarction (caudate, lentiform nuclei, internal capsule, insula, M1-M3 cortex): 7 - Supraganglionic infarction (M4-M6 cortex): 3 Total score (0-10 with 10 being normal): 10 (when discounting chronic infarcts). No evidence of an acute intracranial abnormality. These results were called by telephone at the time of interpretation on 02/18/2024 at 6:30 pm to provider DAVID [facility] , who verbally acknowledged these results. IMPRESSION: 1. No evidence of an acute intracranial abnormality. 2. Parenchymal atrophy, chronic small vessel ischemic disease and unchanged chronic infarcts, as described. Electronically Signed   By: Rockey Childs D.O.   On: 02/18/2024 18:31   CT HEAD CODE STROKE WO CONTRAST Result Date: 02/06/2024 CLINICAL DATA:  Code stroke.  Neuro deficit, concern for stroke. EXAM: CT HEAD WITHOUT CONTRAST TECHNIQUE: Contiguous axial images were obtained from the base of the skull through the vertex without intravenous contrast. RADIATION DOSE REDUCTION: This exam was performed according to the departmental dose-optimization program which includes automated exposure control, adjustment of the mA and/or kV according to patient size and/or use of iterative reconstruction technique. COMPARISON:  MRI head and CT head 06/01/2022. FINDINGS: Brain: No acute intracranial hemorrhage. No CT evidence of completed large territory infarct. Remote lacunar infarct in the bilateral thalami more pronounced on  the right. Additional remote infarct in the right cerebellum and left occipital lobe. Nonspecific hypoattenuation in the periventricular and subcortical white matter favored to reflect chronic microvascular ischemic changes. Mild parenchymal volume loss. Ventricles: The ventricles are normal. Vascular: Atherosclerotic calcifications of the carotid siphons and intracranial vertebral arteries. No hyperdense vessel. Skull: No acute or aggressive finding. Orbits: Orbits are symmetric. Sinuses: The visualized paranasal sinuses are clear.  Other: Mastoid air cells are clear. ASPECTS [place] [facility] Stroke Program Early CT Score) - Ganglionic level infarction (caudate, lentiform nuclei, internal capsule, insula, M1-M3 cortex): 7 - Supraganglionic infarction (M4-M6 cortex): 3 Total score (0-10 with 10 being normal): 10 IMPRESSION: 1. No CT evidence of acute intracranial abnormality. 2. Extensive chronic microvascular ischemic changes and moderate parenchymal volume loss. 3. 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 4. ASPECTS is 10 These results were communicated to Dr. Lindzen at 4:58 pm on 02/06/2024 by text page via the [facility] messaging system. Electronically Signed   By: Donnice Mania M.D.   On: 02/06/2024 16:58      Discharge Exam: Vitals:   02/22/24 0718 02/22/24 1109  BP: 133/79 126/75  Pulse: 75 67  Resp: 18   Temp: 97.8 F (36.6 C)   SpO2: (!) 89% 97%   General: Pt is alert, awake, not in acute distress Cardiovascular: RRR, S1/S2 +, no edema Respiratory: CTA bilaterally, no wheezing, no rhonchi, no respiratory distress, no conversational dyspnea  Abdominal: Soft, NT, ND, bowel sounds + Extremities: no edema, no cyanosis Psych: Normal mood and affect   The results of significant diagnostics from this hospitalization (including imaging, microbiology, ancillary and laboratory) are listed below for reference.     Microbiology: No results found for this or any  previous visit (from the past 240 hours).   Labs: BNP (last 3 results) No results for input(s): BNP in the last 8760 hours. Basic Metabolic Panel: Recent Labs  Lab 02/18/24 1546 02/20/24 0524 02/21/24 0434  NA 134* 138 137  K 4.3 3.1* 4.2  CL 97* 102 106  CO2 20* 24 20*  GLUCOSE 118* 90 95  BUN 13 10 11   CREATININE 0.95 1.01* 0.97  CALCIUM  10.3 9.4 9.3  MG  --   --  2.2   Liver Function Tests: Recent Labs  Lab 02/18/24 1546  AST 26  ALT 13  ALKPHOS 68  BILITOT 1.4*  PROT 8.2*  ALBUMIN 5.1*   No results for input(s): LIPASE, AMYLASE in the last 168 hours. No results for input(s): AMMONIA in the last 168 hours. CBC: Recent Labs  Lab 02/18/24 1756 02/19/24 1747 02/20/24 0524 02/21/24 0434 02/22/24 0419  WBC 16.0* 14.0* 11.8* 12.1* 14.2*  NEUTROABS 14.1*  --   --   --   --   HGB 14.4 13.6 13.7 13.9 13.0  HCT 42.6 40.5 41.3 42.1 39.4  MCV 88.6 89.8 90.8 90.1 90.6  PLT 324 238 238 245 236   Cardiac Enzymes: No results for input(s): CKTOTAL, CKMB, CKMBINDEX, TROPONINI in the last 168 hours. BNP: Invalid input(s): POCBNP CBG: Recent Labs  Lab 02/18/24 1743  GLUCAP 121*   D-Dimer No results for input(s): DDIMER in the last 72 hours. Hgb A1c No results for input(s): HGBA1C in the last 72 hours. Lipid Profile No results for input(s): CHOL, HDL, LDLCALC, TRIG, CHOLHDL, LDLDIRECT in the last 72 hours. Thyroid  function studies No results for input(s): TSH, T4TOTAL, T3FREE, THYROIDAB in the last 72 hours.  Invalid input(s): FREET3 Anemia work up No results for input(s): VITAMINB12, FOLATE, FERRITIN, TIBC, IRON, RETICCTPCT in the last 72 hours. Urinalysis    Component Value Date/Time   COLORURINE YELLOW 02/20/2024 1256   APPEARANCEUR CLEAR 02/20/2024 1256   APPEARANCEUR Clear 07/08/2014 2155   LABSPEC 1.010 02/20/2024 1256   LABSPEC 1.003 07/08/2014 2155   PHURINE 7.0 02/20/2024 1256   GLUCOSEU  NEGATIVE 02/20/2024 1256   GLUCOSEU Negative 07/08/2014  2155   HGBUR NEGATIVE 02/20/2024 1256   BILIRUBINUR NEGATIVE 02/20/2024 1256   BILIRUBINUR Negative 07/08/2014 2155   KETONESUR NEGATIVE 02/20/2024 1256   PROTEINUR NEGATIVE 02/20/2024 1256   NITRITE NEGATIVE 02/20/2024 1256   LEUKOCYTESUR MODERATE (A) 02/20/2024 1256   LEUKOCYTESUR Negative 07/08/2014 2155   Sepsis Labs Recent Labs  Lab 02/19/24 1747 02/20/24 0524 02/21/24 0434 02/22/24 0419  WBC 14.0* 11.8* 12.1* 14.2*   Microbiology No results found for this or any previous visit (from the past 240 hours).   Patient was seen and examined on the day of discharge and was found to be in stable condition. Time coordinating discharge: 35 minutes including assessment and coordination of care, as well as examination of the patient.   SIGNED:  Delon Hoe, DO [facility] 02/22/2024, 11:10 AM

## 2024-02-22 NOTE — Progress Notes (Signed)
 CSW spoke with Tammy in admissions at [facility]. Patient can admit today and will be going to room 801, number for report is [phone]. The medical necessity was completed and PTAR was called. CSW notified patients RN of discharge. Discharge summary sent to facility. CSW also spoke with patients daughter Rayfield to inform her of discharge. Patients son is at bedside.

## 2024-02-22 NOTE — Progress Notes (Signed)
 Attempted to call report to [facility] unsuccess[facility]. AVS handed over to [facility]

## 2024-02-22 NOTE — Progress Notes (Signed)
 CSW contacted [employer] and patients insurance authorization was approved. The patients reference number is [id number] and patient will have 5 days to admit to [facility]. CSW contacted Tammy with admissions at [facility] to see when patient can admit. CSW left a message requesting a call back.

## 2024-02-22 NOTE — Plan of Care (Signed)
  Problem: Clinical Measurements: Goal: Ability to maintain clinical measurements within normal limits will improve Outcome: Adequate for Discharge   Problem: Health Behavior/Discharge Planning: Goal: Ability to manage health-related needs will improve Outcome: Adequate for Discharge    Discharged to [facility]

## 2024-02-24 DIAGNOSIS — K219 Gastro-esophageal reflux disease without esophagitis: Secondary | ICD-10-CM | POA: Diagnosis not present

## 2024-02-24 DIAGNOSIS — I10 Essential (primary) hypertension: Secondary | ICD-10-CM | POA: Diagnosis not present

## 2024-02-24 DIAGNOSIS — E785 Hyperlipidemia, unspecified: Secondary | ICD-10-CM | POA: Diagnosis not present

## 2024-02-24 DIAGNOSIS — M6259 Muscle wasting and atrophy, not elsewhere classified, multiple sites: Secondary | ICD-10-CM | POA: Diagnosis not present

## 2024-02-27 DIAGNOSIS — R569 Unspecified convulsions: Secondary | ICD-10-CM | POA: Diagnosis not present

## 2024-02-28 DIAGNOSIS — E871 Hypo-osmolality and hyponatremia: Secondary | ICD-10-CM | POA: Diagnosis not present

## 2024-02-28 DIAGNOSIS — R569 Unspecified convulsions: Secondary | ICD-10-CM | POA: Diagnosis not present

## 2024-02-28 DIAGNOSIS — I10 Essential (primary) hypertension: Secondary | ICD-10-CM | POA: Diagnosis not present

## 2024-03-02 DIAGNOSIS — R569 Unspecified convulsions: Secondary | ICD-10-CM | POA: Diagnosis not present

## 2024-03-02 DIAGNOSIS — E785 Hyperlipidemia, unspecified: Secondary | ICD-10-CM | POA: Diagnosis not present

## 2024-03-02 DIAGNOSIS — Z8673 Personal history of transient ischemic attack (TIA), and cerebral infarction without residual deficits: Secondary | ICD-10-CM | POA: Diagnosis not present

## 2024-03-02 DIAGNOSIS — I10 Essential (primary) hypertension: Secondary | ICD-10-CM | POA: Diagnosis not present

## 2024-03-02 DIAGNOSIS — R4701 Aphasia: Secondary | ICD-10-CM | POA: Diagnosis not present

## 2024-03-03 DIAGNOSIS — R569 Unspecified convulsions: Secondary | ICD-10-CM | POA: Diagnosis not present

## 2024-03-03 DIAGNOSIS — E871 Hypo-osmolality and hyponatremia: Secondary | ICD-10-CM | POA: Diagnosis not present

## 2024-03-03 DIAGNOSIS — R7981 Abnormal blood-gas level: Secondary | ICD-10-CM | POA: Diagnosis not present

## 2024-03-03 DIAGNOSIS — D72829 Elevated white blood cell count, unspecified: Secondary | ICD-10-CM | POA: Diagnosis not present

## 2024-03-04 DIAGNOSIS — I10 Essential (primary) hypertension: Secondary | ICD-10-CM | POA: Diagnosis not present

## 2024-03-05 DIAGNOSIS — D72829 Elevated white blood cell count, unspecified: Secondary | ICD-10-CM | POA: Diagnosis not present

## 2024-03-05 DIAGNOSIS — Z8673 Personal history of transient ischemic attack (TIA), and cerebral infarction without residual deficits: Secondary | ICD-10-CM | POA: Diagnosis not present

## 2024-03-05 DIAGNOSIS — E871 Hypo-osmolality and hyponatremia: Secondary | ICD-10-CM | POA: Diagnosis not present

## 2024-03-05 DIAGNOSIS — R569 Unspecified convulsions: Secondary | ICD-10-CM | POA: Diagnosis not present

## 2024-03-11 DIAGNOSIS — I10 Essential (primary) hypertension: Secondary | ICD-10-CM | POA: Diagnosis not present

## 2024-03-11 DIAGNOSIS — K219 Gastro-esophageal reflux disease without esophagitis: Secondary | ICD-10-CM | POA: Diagnosis not present

## 2024-03-11 DIAGNOSIS — I2510 Atherosclerotic heart disease of native coronary artery without angina pectoris: Secondary | ICD-10-CM | POA: Diagnosis not present

## 2024-03-11 DIAGNOSIS — R2681 Unsteadiness on feet: Secondary | ICD-10-CM | POA: Diagnosis not present

## 2024-03-11 DIAGNOSIS — M6250 Muscle wasting and atrophy, not elsewhere classified, unspecified site: Secondary | ICD-10-CM | POA: Diagnosis not present

## 2024-03-11 DIAGNOSIS — Z8673 Personal history of transient ischemic attack (TIA), and cerebral infarction without residual deficits: Secondary | ICD-10-CM | POA: Diagnosis not present

## 2024-03-11 DIAGNOSIS — H409 Unspecified glaucoma: Secondary | ICD-10-CM | POA: Diagnosis not present

## 2024-03-11 DIAGNOSIS — E871 Hypo-osmolality and hyponatremia: Secondary | ICD-10-CM | POA: Diagnosis not present

## 2024-03-11 DIAGNOSIS — M1612 Unilateral primary osteoarthritis, left hip: Secondary | ICD-10-CM | POA: Diagnosis not present

## 2024-03-12 DIAGNOSIS — H34831 Tributary (branch) retinal vein occlusion, right eye, with macular edema: Secondary | ICD-10-CM | POA: Diagnosis not present

## 2024-03-12 DIAGNOSIS — H401121 Primary open-angle glaucoma, left eye, mild stage: Secondary | ICD-10-CM | POA: Diagnosis not present

## 2024-03-12 DIAGNOSIS — H401112 Primary open-angle glaucoma, right eye, moderate stage: Secondary | ICD-10-CM | POA: Diagnosis not present

## 2024-03-12 DIAGNOSIS — Z961 Presence of intraocular lens: Secondary | ICD-10-CM | POA: Diagnosis not present

## 2024-03-18 DIAGNOSIS — M6281 Muscle weakness (generalized): Secondary | ICD-10-CM | POA: Diagnosis not present

## 2024-03-18 DIAGNOSIS — R569 Unspecified convulsions: Secondary | ICD-10-CM | POA: Diagnosis not present

## 2024-03-18 DIAGNOSIS — I10 Essential (primary) hypertension: Secondary | ICD-10-CM | POA: Diagnosis not present

## 2024-03-20 DIAGNOSIS — Z8673 Personal history of transient ischemic attack (TIA), and cerebral infarction without residual deficits: Secondary | ICD-10-CM | POA: Diagnosis not present

## 2024-03-20 DIAGNOSIS — R4181 Age-related cognitive decline: Secondary | ICD-10-CM | POA: Diagnosis not present

## 2024-03-23 DIAGNOSIS — Z79899 Other long term (current) drug therapy: Secondary | ICD-10-CM | POA: Diagnosis not present

## 2024-03-23 DIAGNOSIS — E559 Vitamin D deficiency, unspecified: Secondary | ICD-10-CM | POA: Diagnosis not present

## 2024-04-09 DIAGNOSIS — R2681 Unsteadiness on feet: Secondary | ICD-10-CM | POA: Diagnosis not present

## 2024-04-09 DIAGNOSIS — N182 Chronic kidney disease, stage 2 (mild): Secondary | ICD-10-CM | POA: Diagnosis not present

## 2024-04-09 DIAGNOSIS — K219 Gastro-esophageal reflux disease without esophagitis: Secondary | ICD-10-CM | POA: Diagnosis not present

## 2024-04-09 DIAGNOSIS — E538 Deficiency of other specified B group vitamins: Secondary | ICD-10-CM | POA: Diagnosis not present

## 2024-04-09 DIAGNOSIS — R197 Diarrhea, unspecified: Secondary | ICD-10-CM | POA: Diagnosis not present

## 2024-04-09 DIAGNOSIS — E559 Vitamin D deficiency, unspecified: Secondary | ICD-10-CM | POA: Diagnosis not present

## 2024-04-09 DIAGNOSIS — I2510 Atherosclerotic heart disease of native coronary artery without angina pectoris: Secondary | ICD-10-CM | POA: Diagnosis not present

## 2024-04-09 DIAGNOSIS — I1310 Hypertensive heart and chronic kidney disease without heart failure, with stage 1 through stage 4 chronic kidney disease, or unspecified chronic kidney disease: Secondary | ICD-10-CM | POA: Diagnosis not present

## 2024-04-09 DIAGNOSIS — E785 Hyperlipidemia, unspecified: Secondary | ICD-10-CM | POA: Diagnosis not present

## 2024-05-01 DIAGNOSIS — R197 Diarrhea, unspecified: Secondary | ICD-10-CM | POA: Diagnosis not present

## 2024-05-01 DIAGNOSIS — K219 Gastro-esophageal reflux disease without esophagitis: Secondary | ICD-10-CM | POA: Diagnosis not present

## 2024-05-01 DIAGNOSIS — F418 Other specified anxiety disorders: Secondary | ICD-10-CM | POA: Diagnosis not present

## 2024-05-08 DIAGNOSIS — E785 Hyperlipidemia, unspecified: Secondary | ICD-10-CM | POA: Diagnosis not present

## 2024-05-08 DIAGNOSIS — I1310 Hypertensive heart and chronic kidney disease without heart failure, with stage 1 through stage 4 chronic kidney disease, or unspecified chronic kidney disease: Secondary | ICD-10-CM | POA: Diagnosis not present

## 2024-05-08 DIAGNOSIS — R197 Diarrhea, unspecified: Secondary | ICD-10-CM | POA: Diagnosis not present

## 2024-05-08 DIAGNOSIS — N182 Chronic kidney disease, stage 2 (mild): Secondary | ICD-10-CM | POA: Diagnosis not present

## 2024-05-13 DIAGNOSIS — E559 Vitamin D deficiency, unspecified: Secondary | ICD-10-CM | POA: Diagnosis not present

## 2024-05-13 DIAGNOSIS — M810 Age-related osteoporosis without current pathological fracture: Secondary | ICD-10-CM | POA: Diagnosis not present

## 2024-06-04 DIAGNOSIS — I2510 Atherosclerotic heart disease of native coronary artery without angina pectoris: Secondary | ICD-10-CM | POA: Diagnosis not present

## 2024-06-04 DIAGNOSIS — E785 Hyperlipidemia, unspecified: Secondary | ICD-10-CM | POA: Diagnosis not present

## 2024-06-04 DIAGNOSIS — K219 Gastro-esophageal reflux disease without esophagitis: Secondary | ICD-10-CM | POA: Diagnosis not present

## 2024-06-08 DIAGNOSIS — R4701 Aphasia: Secondary | ICD-10-CM | POA: Diagnosis not present

## 2024-06-08 DIAGNOSIS — R569 Unspecified convulsions: Secondary | ICD-10-CM | POA: Diagnosis not present

## 2024-06-08 DIAGNOSIS — Z8673 Personal history of transient ischemic attack (TIA), and cerebral infarction without residual deficits: Secondary | ICD-10-CM | POA: Diagnosis not present

## 2024-06-10 DIAGNOSIS — H409 Unspecified glaucoma: Secondary | ICD-10-CM | POA: Diagnosis not present

## 2024-06-10 DIAGNOSIS — E538 Deficiency of other specified B group vitamins: Secondary | ICD-10-CM | POA: Diagnosis not present

## 2024-06-10 DIAGNOSIS — M6281 Muscle weakness (generalized): Secondary | ICD-10-CM | POA: Diagnosis not present

## 2024-06-10 DIAGNOSIS — E785 Hyperlipidemia, unspecified: Secondary | ICD-10-CM | POA: Diagnosis not present

## 2024-06-10 DIAGNOSIS — Z8673 Personal history of transient ischemic attack (TIA), and cerebral infarction without residual deficits: Secondary | ICD-10-CM | POA: Diagnosis not present

## 2024-06-10 DIAGNOSIS — K219 Gastro-esophageal reflux disease without esophagitis: Secondary | ICD-10-CM | POA: Diagnosis not present

## 2024-06-10 DIAGNOSIS — R2681 Unsteadiness on feet: Secondary | ICD-10-CM | POA: Diagnosis not present

## 2024-06-10 DIAGNOSIS — I1310 Hypertensive heart and chronic kidney disease without heart failure, with stage 1 through stage 4 chronic kidney disease, or unspecified chronic kidney disease: Secondary | ICD-10-CM | POA: Diagnosis not present

## 2024-06-10 DIAGNOSIS — E559 Vitamin D deficiency, unspecified: Secondary | ICD-10-CM | POA: Diagnosis not present

## 2024-06-10 DIAGNOSIS — I2510 Atherosclerotic heart disease of native coronary artery without angina pectoris: Secondary | ICD-10-CM | POA: Diagnosis not present

## 2024-06-10 DIAGNOSIS — M1612 Unilateral primary osteoarthritis, left hip: Secondary | ICD-10-CM | POA: Diagnosis not present

## 2024-06-10 DIAGNOSIS — N182 Chronic kidney disease, stage 2 (mild): Secondary | ICD-10-CM | POA: Diagnosis not present

## 2024-06-10 DIAGNOSIS — I10 Essential (primary) hypertension: Secondary | ICD-10-CM | POA: Diagnosis not present

## 2024-06-10 DIAGNOSIS — K580 Irritable bowel syndrome with diarrhea: Secondary | ICD-10-CM | POA: Diagnosis not present

## 2024-06-16 ENCOUNTER — Encounter: Payer: Self-pay | Admitting: Emergency Medicine

## 2024-06-16 ENCOUNTER — Other Ambulatory Visit: Payer: Self-pay

## 2024-06-16 ENCOUNTER — Emergency Department

## 2024-06-16 ENCOUNTER — Inpatient Hospital Stay
Admission: EM | Admit: 2024-06-16 | Discharge: 2024-06-23 | DRG: 291 | Disposition: A | Discharge status: Home Health | Source: Skilled Nursing Facility | Attending: Internal Medicine | Admitting: Internal Medicine

## 2024-06-16 ENCOUNTER — Observation Stay

## 2024-06-16 DIAGNOSIS — I130 Hypertensive heart and chronic kidney disease with heart failure and stage 1 through stage 4 chronic kidney disease, or unspecified chronic kidney disease: Principal | ICD-10-CM | POA: Diagnosis present

## 2024-06-16 DIAGNOSIS — R197 Diarrhea, unspecified: Secondary | ICD-10-CM

## 2024-06-16 DIAGNOSIS — I5033 Acute on chronic diastolic (congestive) heart failure: Secondary | ICD-10-CM | POA: Diagnosis present

## 2024-06-16 DIAGNOSIS — J929 Pleural plaque without asbestos: Secondary | ICD-10-CM | POA: Diagnosis not present

## 2024-06-16 DIAGNOSIS — A0472 Enterocolitis due to Clostridium difficile, not specified as recurrent: Secondary | ICD-10-CM | POA: Diagnosis not present

## 2024-06-16 DIAGNOSIS — K580 Irritable bowel syndrome with diarrhea: Secondary | ICD-10-CM | POA: Diagnosis present

## 2024-06-16 DIAGNOSIS — M159 Polyosteoarthritis, unspecified: Secondary | ICD-10-CM | POA: Diagnosis present

## 2024-06-16 DIAGNOSIS — Z803 Family history of malignant neoplasm of breast: Secondary | ICD-10-CM

## 2024-06-16 DIAGNOSIS — I10 Essential (primary) hypertension: Secondary | ICD-10-CM | POA: Diagnosis not present

## 2024-06-16 DIAGNOSIS — R0602 Shortness of breath: Principal | ICD-10-CM

## 2024-06-16 DIAGNOSIS — M25562 Pain in left knee: Secondary | ICD-10-CM | POA: Diagnosis present

## 2024-06-16 DIAGNOSIS — M7989 Other specified soft tissue disorders: Secondary | ICD-10-CM | POA: Diagnosis not present

## 2024-06-16 DIAGNOSIS — R569 Unspecified convulsions: Secondary | ICD-10-CM | POA: Diagnosis present

## 2024-06-16 DIAGNOSIS — R000 Tachycardia, unspecified: Secondary | ICD-10-CM | POA: Diagnosis present

## 2024-06-16 DIAGNOSIS — N1330 Unspecified hydronephrosis: Secondary | ICD-10-CM | POA: Diagnosis not present

## 2024-06-16 DIAGNOSIS — N182 Chronic kidney disease, stage 2 (mild): Secondary | ICD-10-CM | POA: Insufficient documentation

## 2024-06-16 DIAGNOSIS — J90 Pleural effusion, not elsewhere classified: Secondary | ICD-10-CM | POA: Diagnosis not present

## 2024-06-16 DIAGNOSIS — R0902 Hypoxemia: Secondary | ICD-10-CM | POA: Diagnosis not present

## 2024-06-16 DIAGNOSIS — Z79899 Other long term (current) drug therapy: Secondary | ICD-10-CM

## 2024-06-16 DIAGNOSIS — J181 Lobar pneumonia, unspecified organism: Secondary | ICD-10-CM | POA: Diagnosis not present

## 2024-06-16 DIAGNOSIS — R918 Other nonspecific abnormal finding of lung field: Secondary | ICD-10-CM | POA: Diagnosis not present

## 2024-06-16 DIAGNOSIS — I509 Heart failure, unspecified: Secondary | ICD-10-CM | POA: Diagnosis not present

## 2024-06-16 DIAGNOSIS — E7800 Pure hypercholesterolemia, unspecified: Secondary | ICD-10-CM | POA: Diagnosis present

## 2024-06-16 DIAGNOSIS — Z806 Family history of leukemia: Secondary | ICD-10-CM

## 2024-06-16 DIAGNOSIS — K5730 Diverticulosis of large intestine without perforation or abscess without bleeding: Secondary | ICD-10-CM | POA: Diagnosis not present

## 2024-06-16 DIAGNOSIS — Z7982 Long term (current) use of aspirin: Secondary | ICD-10-CM

## 2024-06-16 DIAGNOSIS — M8580 Other specified disorders of bone density and structure, unspecified site: Secondary | ICD-10-CM | POA: Diagnosis present

## 2024-06-16 DIAGNOSIS — E876 Hypokalemia: Secondary | ICD-10-CM | POA: Diagnosis not present

## 2024-06-16 DIAGNOSIS — Z1152 Encounter for screening for COVID-19: Secondary | ICD-10-CM

## 2024-06-16 DIAGNOSIS — I771 Stricture of artery: Secondary | ICD-10-CM | POA: Diagnosis not present

## 2024-06-16 DIAGNOSIS — J9601 Acute respiratory failure with hypoxia: Secondary | ICD-10-CM | POA: Diagnosis present

## 2024-06-16 DIAGNOSIS — N1831 Chronic kidney disease, stage 3a: Secondary | ICD-10-CM | POA: Diagnosis present

## 2024-06-16 DIAGNOSIS — T501X5A Adverse effect of loop [high-ceiling] diuretics, initial encounter: Secondary | ICD-10-CM | POA: Diagnosis not present

## 2024-06-16 DIAGNOSIS — Z96642 Presence of left artificial hip joint: Secondary | ICD-10-CM | POA: Diagnosis present

## 2024-06-16 DIAGNOSIS — Z974 Presence of external hearing-aid: Secondary | ICD-10-CM

## 2024-06-16 DIAGNOSIS — M7122 Synovial cyst of popliteal space [Baker], left knee: Secondary | ICD-10-CM | POA: Diagnosis not present

## 2024-06-16 DIAGNOSIS — Z96651 Presence of right artificial knee joint: Secondary | ICD-10-CM | POA: Diagnosis present

## 2024-06-16 DIAGNOSIS — I517 Cardiomegaly: Secondary | ICD-10-CM | POA: Diagnosis not present

## 2024-06-16 DIAGNOSIS — Z823 Family history of stroke: Secondary | ICD-10-CM

## 2024-06-16 DIAGNOSIS — Z66 Do not resuscitate: Secondary | ICD-10-CM | POA: Diagnosis present

## 2024-06-16 DIAGNOSIS — M25462 Effusion, left knee: Secondary | ICD-10-CM | POA: Diagnosis not present

## 2024-06-16 DIAGNOSIS — M51369 Other intervertebral disc degeneration, lumbar region without mention of lumbar back pain or lower extremity pain: Secondary | ICD-10-CM | POA: Diagnosis present

## 2024-06-16 DIAGNOSIS — Z8673 Personal history of transient ischemic attack (TIA), and cerebral infarction without residual deficits: Secondary | ICD-10-CM

## 2024-06-16 LAB — CBC WITH DIFFERENTIAL/PLATELET
Abs Immature Granulocytes: 0.02 K/uL (ref 0.00–0.07)
Basophils Absolute: 0.1 K/uL (ref 0.0–0.1)
Basophils Relative: 1 %
Eosinophils Absolute: 0.5 K/uL (ref 0.0–0.5)
Eosinophils Relative: 6 %
HCT: 35.9 % — ABNORMAL LOW (ref 36.0–46.0)
Hemoglobin: 11.8 g/dL — ABNORMAL LOW (ref 12.0–15.0)
Immature Granulocytes: 0 %
Lymphocytes Relative: 10 %
Lymphs Abs: 0.9 K/uL (ref 0.7–4.0)
MCH: 29 pg (ref 26.0–34.0)
MCHC: 32.9 g/dL (ref 30.0–36.0)
MCV: 88.2 fL (ref 80.0–100.0)
Monocytes Absolute: 0.9 K/uL (ref 0.1–1.0)
Monocytes Relative: 10 %
Neutro Abs: 6.6 K/uL (ref 1.7–7.7)
Neutrophils Relative %: 73 %
Platelets: 212 K/uL (ref 150–400)
RBC: 4.07 MIL/uL (ref 3.87–5.11)
RDW: 15.6 % — ABNORMAL HIGH (ref 11.5–15.5)
WBC: 8.9 K/uL (ref 4.0–10.5)
nRBC: 0 % (ref 0.0–0.2)

## 2024-06-16 LAB — COMPREHENSIVE METABOLIC PANEL WITH GFR
ALT: 10 U/L (ref 0–44)
AST: 17 U/L (ref 15–41)
Albumin: 3.8 g/dL (ref 3.5–5.0)
Alkaline Phosphatase: 75 U/L (ref 38–126)
Anion gap: 10 (ref 5–15)
BUN: 16 mg/dL (ref 8–23)
CO2: 23 mmol/L (ref 22–32)
Calcium: 8.7 mg/dL — ABNORMAL LOW (ref 8.9–10.3)
Chloride: 104 mmol/L (ref 98–111)
Creatinine, Ser: 1.06 mg/dL — ABNORMAL HIGH (ref 0.44–1.00)
GFR, Estimated: 50 mL/min — ABNORMAL LOW (ref >60)
Glucose, Bld: 99 mg/dL (ref 70–99)
Potassium: 3 mmol/L — ABNORMAL LOW (ref 3.5–5.1)
Sodium: 137 mmol/L (ref 135–145)
Total Bilirubin: 0.9 mg/dL (ref 0.0–1.2)
Total Protein: 6.6 g/dL (ref 6.5–8.1)

## 2024-06-16 LAB — D-DIMER, QUANTITATIVE: D-Dimer, Quant: 0.98 ug{FEU}/mL — ABNORMAL HIGH (ref 0.00–0.50)

## 2024-06-16 LAB — RESP PANEL BY RT-PCR (RSV, FLU A&B, COVID)  RVPGX2
Influenza A by PCR: NEGATIVE
Influenza B by PCR: NEGATIVE
Resp Syncytial Virus by PCR: NEGATIVE
SARS Coronavirus 2 by RT PCR: NEGATIVE

## 2024-06-16 LAB — BRAIN NATRIURETIC PEPTIDE: B Natriuretic Peptide: 369.8 pg/mL — ABNORMAL HIGH (ref 0.0–100.0)

## 2024-06-16 LAB — TROPONIN I (HIGH SENSITIVITY)
Troponin I (High Sensitivity): 8 ng/L (ref <18)
Troponin I (High Sensitivity): 9 ng/L (ref <18)

## 2024-06-16 MED ORDER — FUROSEMIDE 10 MG/ML IJ SOLN
20.0000 mg | Freq: Two times a day (BID) | INTRAMUSCULAR | Status: AC
  Administered 2024-06-16 – 2024-06-17 (×2): 20 mg via INTRAVENOUS
  Filled 2024-06-16: qty 2
  Filled 2024-06-16: qty 4

## 2024-06-16 MED ORDER — ONDANSETRON HCL 4 MG/2ML IJ SOLN
4.0000 mg | Freq: Once | INTRAMUSCULAR | Status: AC
  Administered 2024-06-16: 4 mg via INTRAVENOUS
  Filled 2024-06-16: qty 2

## 2024-06-16 MED ORDER — ATORVASTATIN CALCIUM 20 MG PO TABS
20.0000 mg | ORAL_TABLET | Freq: Every day | ORAL | Status: DC
  Administered 2024-06-16 – 2024-06-22 (×7): 20 mg via ORAL
  Filled 2024-06-16 (×7): qty 1

## 2024-06-16 MED ORDER — LOSARTAN POTASSIUM 25 MG PO TABS
25.0000 mg | ORAL_TABLET | Freq: Every day | ORAL | Status: DC
  Administered 2024-06-16 – 2024-06-21 (×6): 25 mg via ORAL
  Filled 2024-06-16 (×6): qty 1

## 2024-06-16 MED ORDER — LAMOTRIGINE 25 MG PO TABS
125.0000 mg | ORAL_TABLET | Freq: Every day | ORAL | Status: DC
  Administered 2024-06-16 – 2024-06-22 (×7): 125 mg via ORAL
  Filled 2024-06-16 (×7): qty 1

## 2024-06-16 MED ORDER — ENOXAPARIN SODIUM 40 MG/0.4ML IJ SOSY
40.0000 mg | PREFILLED_SYRINGE | INTRAMUSCULAR | Status: DC
  Administered 2024-06-16: 40 mg via SUBCUTANEOUS
  Filled 2024-06-16: qty 0.4

## 2024-06-16 MED ORDER — IOHEXOL 350 MG/ML SOLN
75.0000 mL | Freq: Once | INTRAVENOUS | Status: AC | PRN
  Administered 2024-06-16: 75 mL via INTRAVENOUS

## 2024-06-16 MED ORDER — FUROSEMIDE 10 MG/ML IJ SOLN
20.0000 mg | Freq: Once | INTRAMUSCULAR | Status: AC
  Administered 2024-06-16: 20 mg via INTRAVENOUS
  Filled 2024-06-16: qty 4

## 2024-06-16 MED ORDER — SODIUM CHLORIDE 0.9 % IV SOLN: 250.0000 mL | INTRAVENOUS | Status: AC | PRN

## 2024-06-16 MED ORDER — LATANOPROST 0.005 % OP SOLN
1.0000 [drp] | Freq: Every day | OPHTHALMIC | Status: DC
  Administered 2024-06-16 – 2024-06-22 (×7): 1 [drp] via OPHTHALMIC
  Filled 2024-06-16 (×3): qty 2.5

## 2024-06-16 MED ORDER — ACETAMINOPHEN 325 MG PO TABS
650.0000 mg | ORAL_TABLET | ORAL | Status: DC | PRN
  Administered 2024-06-18: 650 mg via ORAL
  Filled 2024-06-16: qty 2

## 2024-06-16 MED ORDER — SODIUM CHLORIDE 0.9% FLUSH: 3.0000 mL | INTRAVENOUS | Status: DC | PRN

## 2024-06-16 MED ORDER — LAMOTRIGINE 25 MG PO TABS: 25.0000 mg | ORAL_TABLET | Freq: Two times a day (BID) | ORAL | Status: DC

## 2024-06-16 MED ORDER — PANTOPRAZOLE SODIUM 20 MG PO TBEC
20.0000 mg | DELAYED_RELEASE_TABLET | Freq: Every day | ORAL | Status: DC
  Administered 2024-06-16 – 2024-06-23 (×8): 20 mg via ORAL
  Filled 2024-06-16 (×8): qty 1

## 2024-06-16 MED ORDER — CARVEDILOL 3.125 MG PO TABS
3.1250 mg | ORAL_TABLET | Freq: Two times a day (BID) | ORAL | Status: DC
  Administered 2024-06-16 – 2024-06-22 (×13): 3.125 mg via ORAL
  Filled 2024-06-16 (×13): qty 1

## 2024-06-16 MED ORDER — TIMOLOL MALEATE 0.5 % OP SOLN
1.0000 [drp] | Freq: Two times a day (BID) | OPHTHALMIC | Status: DC
  Administered 2024-06-16 – 2024-06-23 (×14): 1 [drp] via OPHTHALMIC
  Filled 2024-06-16 (×3): qty 5

## 2024-06-16 MED ORDER — ONDANSETRON HCL 4 MG/2ML IJ SOLN
4.0000 mg | Freq: Four times a day (QID) | INTRAMUSCULAR | Status: DC | PRN
  Administered 2024-06-17: 4 mg via INTRAVENOUS
  Filled 2024-06-16: qty 2

## 2024-06-16 MED ORDER — POTASSIUM CHLORIDE CRYS ER 20 MEQ PO TBCR
40.0000 meq | EXTENDED_RELEASE_TABLET | Freq: Once | ORAL | Status: AC
  Administered 2024-06-16: 40 meq via ORAL
  Filled 2024-06-16: qty 2

## 2024-06-16 MED ORDER — ASPIRIN 81 MG PO CHEW
81.0000 mg | CHEWABLE_TABLET | Freq: Every day | ORAL | Status: DC
  Administered 2024-06-16 – 2024-06-23 (×8): 81 mg via ORAL
  Filled 2024-06-16 (×8): qty 1

## 2024-06-16 MED ORDER — SODIUM CHLORIDE 0.9% FLUSH
3.0000 mL | Freq: Two times a day (BID) | INTRAVENOUS | Status: DC
  Administered 2024-06-16 – 2024-06-23 (×15): 3 mL via INTRAVENOUS

## 2024-06-16 MED ORDER — LAMOTRIGINE 100 MG PO TABS
100.0000 mg | ORAL_TABLET | Freq: Every day | ORAL | Status: DC
  Administered 2024-06-16 – 2024-06-23 (×8): 100 mg via ORAL
  Filled 2024-06-16 (×9): qty 1

## 2024-06-16 NOTE — ED Notes (Signed)
 NURSE BRANDON INFORMED OF BED ASSIGNED

## 2024-06-16 NOTE — H&P (Signed)
 History and Physical    Kellie Harris DOB: 1931-01-20 DOA: 06/16/2024  PCP: [name], MD (Confirm with patient/family/NH records and if not entered, this has to be entered at [facility] point of entry) Patient coming from: Assisted living  I have personally briefly reviewed patient's old medical records in [facility]  Chief Complaint: Cough, shortness of breath, leg swelling  HPI: Kellie Harris is a 89 y.o. female with medical history significant of HTN, chronic HFpEF, multiple OA's, HTN, HLD, IBS presented with worsening of shortness of breath and leg swelling.  Symptoms started about 1 week ago, patient started to notice increasing ankle edema as well as [place] cough and increasing exertional dyspnea.  She denied any chest pain, no fever or chills.  Last night, she could not lay flat on the bed because of increasing shortness of breath and decided to come to ED. patient also reported that last 7 days she has had a flareup of her IBS with diarrhea watery problems 1-2 times a day but denied any abdominal pain, she has tried Imodium  with some improvement.  ED Course: Afebrile, tachycardia blood pressure slightly elevated 160/80 O2 saturation 100% on room air.  CTA negative for PE but mild pulmonary edema, blood work showed K3.0 bicarb 25 BUN 16 creatinine 1.0, WBC 8.9 hemoglobin 11.8, COVID-negative.  Patient was given IV Lasix 20 mg x 1 in the ED.  Review of Systems: As per HPI otherwise 14 point review of systems negative.    Past Medical History:  Diagnosis Date   Arthritis    right knee, left shoulder/ OSTEO   Cancer (HCC)    skin    Degenerative disc disease, lumbar    L4-L5   Diverticulosis    ITIS   History of hiatal hernia    Hydronephrosis    right   Hypercholesteremia    Hypertension    CONTROLLED ON MEDS   IBS (irritable bowel syndrome)    Knee fracture, right 03/05/2015   DIFFICULTY WITH AMBULATION   Osteopenia    Ovarian cyst    Pancreatic  insufficiency    Stroke [facility]) 2025   unsure if TIA or stroke, no deficits   Wears hearing aid    bilateral    Past Surgical History:  Procedure Laterality Date   CATARACT EXTRACTION W/PHACO Right 11/02/2015   Procedure: CATARACT EXTRACTION PHACO AND INTRAOCULAR LENS PLACEMENT (IOC);  Surgeon: Dene Etienne, MD;  Location: [facility];  Service: Ophthalmology;  Laterality: Right;   CATARACT EXTRACTION W/PHACO Left 07/18/2016   Procedure: CATARACT EXTRACTION PHACO AND INTRAOCULAR LENS PLACEMENT (IOC);  Surgeon: Dene Etienne, MD;  Location: [facility];  Service: Ophthalmology;  Laterality: Left;   COLONOSCOPY     ESOPHAGOGASTRODUODENOSCOPY N/A 02/14/2024   Procedure: EGD (ESOPHAGOGASTRODUODENOSCOPY);  Surgeon: Maryruth Ole DASEN, MD;  Location: [facility] ENDOSCOPY;  Service: Endoscopy;  Laterality: N/A;   FRACTURE SURGERY Left    hip   HARDWARE REMOVAL Left 09/04/2021   Procedure: HARDWARE REMOVAL;  Surgeon: Leora Lynwood SAUNDERS, MD;  Location: [facility] ORS;  Service: Orthopedics;  Laterality: Left;   history of left hip fracture     INTRAMEDULLARY (IM) NAIL INTERTROCHANTERIC Left 02/12/2017   Procedure: INTRAMEDULLARY (IM) NAIL INTERTROCHANTRIC;  Surgeon: Cleotilde Barrio, MD;  Location: [facility] ORS;  Service: Orthopedics;  Laterality: Left;   kidney stent     OOPHORECTOMY Bilateral 2015   ORIF WRIST FRACTURE Right 04/08/2018   Procedure: OPEN REDUCTION INTERNAL FIXATION (ORIF) WRIST FRACTURE;  Surgeon: Celena,  Ozell, MD;  Location[facility] OR;  Service: Orthopedics;  Laterality: Right;   TONSILLECTOMY     TOTAL HIP ARTHROPLASTY Left 09/04/2021   Procedure: TOTAL HIP ARTHROPLASTY ANTERIOR APPROACH;  Surgeon: Leora Lynwood SAUNDERS, MD;  Location: [facility] ORS;  Service: Orthopedics;  Laterality: Left;   TOTAL KNEE ARTHROPLASTY Right 08/07/2017   Procedure: TOTAL KNEE ARTHROPLASTY;  Surgeon: Cleotilde Barrio, MD;  Location: [facility] ORS;  Service: Orthopedics;  Laterality: Right;     reports that she  has never smoked. She has never used smokeless tobacco. She reports that she does not drink alcohol and does not use drugs.  No Known Allergies  Family History  Problem Relation Age of Onset   Breast cancer Mother 17   Leukemia Mother    Stroke Father      Prior to Admission medications   Medication Sig Start Date End Date Taking? Authorizing Provider  acetaminophen  (TYLENOL ) 500 MG tablet Take 1,000 mg by mouth every 6 (six) hours as needed for mild pain (pain score 1-3).    [provider]  amLODipine  (NORVASC ) 10 MG tablet Take 10 mg by mouth daily. 11/06/23   [provider]  aspirin  81 MG chewable tablet Chew 1 tablet (81 mg total) by mouth daily. 02/23/24   Rojelio Nest, DO  atorvastatin  (LIPITOR) 40 MG tablet Take 40 mg by mouth every evening. 02/13/24   [provider]  Cholecalciferol  (VITAMIN D ) 50 MCG (2000 UT) tablet Take 2,000 Units by mouth daily.    [provider]  lamoTRIgine  (LAMICTAL ) 25 MG tablet Take 1 tablet (25 mg total) by mouth at bedtime for 12 days, THEN 1 tablet (25 mg total) 2 (two) times daily for 14 days. 02/22/24 03/19/24  Rojelio Nest, DO  latanoprost  (XALATAN ) 0.005 % ophthalmic solution Place 1 drop into both eyes at bedtime. 05/21/22   [provider]  losartan  (COZAAR ) 25 MG tablet Take 25 mg by mouth daily.    [provider]  metoprolol  succinate (TOPROL -XL) 25 MG 24 hr tablet Take 0.5 tablets (12.5 mg total) by mouth daily. 02/23/24   Rojelio Nest, DO  pantoprazole  (PROTONIX ) 20 MG tablet Take 1 tablet (20 mg total) by mouth daily. 02/14/24 02/13/25  Maryruth Ole DASEN, MD  timolol  (TIMOPTIC ) 0.5 % ophthalmic solution Apply 1 drop to eye.  Place 1 drop into both eyes once daily 06/10/23   [provider]  vitamin B-12 1000 MCG tablet Take 1 tablet (1,000 mcg total) by mouth daily. 09/10/21   Laurita Pillion, MD    Physical Exam: Vitals:   06/16/24 0705 06/16/24 0800 06/16/24 0827 06/16/24 0830   BP:  (!) 169/87  (!) 160/79  Pulse: 79 63  83  Resp: 18 (!) 93  (!) 23  Temp:   (!) 97.2 F (36.2 C)   TempSrc:   Axillary   SpO2: 97% (!) 22%  100%    Constitutional: NAD, calm, comfortable Vitals:   06/16/24 0705 06/16/24 0800 06/16/24 0827 06/16/24 0830  BP:  (!) 169/87  (!) 160/79  Pulse: 79 63  83  Resp: 18 (!) 93  (!) 23  Temp:   (!) 97.2 F (36.2 C)   TempSrc:   Axillary   SpO2: 97% (!) 22%  100%   Eyes: PERRL, lids and conjunctivae normal ENMT: Mucous membranes are moist. Posterior pharynx clear of any exudate or lesions.Normal dentition.  Neck: normal, supple, no masses, no thyromegaly Respiratory: clear to auscultation bilaterally, no wheezing, fine crackles on bilateral lower  fields, increasing breathing effort respiratory effort. No accessory muscle use.  Cardiovascular: Regular rate and rhythm, no murmurs / rubs / gallops.  2+ extremity edema. 2+ pedal pulses. No carotid bruits.  Abdomen: no tenderness, no masses palpated. No hepatosplenomegaly. Bowel sounds positive.  Musculoskeletal: no clubbing / cyanosis. No joint deformity upper and lower extremities. Good ROM, no contractures. Normal muscle tone.  Skin: no rashes, lesions, ulcers. No induration Neurologic: CN 2-12 grossly intact. Sensation intact, DTR normal. Strength 5/5 in all 4.  Psychiatric: Normal judgment and insight. Alert and oriented x 3. Normal mood.     Labs on Admission: I have personally reviewed following labs and imaging studies  CBC: Recent Labs  Lab 06/16/24 0352  WBC 8.9  NEUTROABS 6.6  HGB 11.8*  HCT 35.9*  MCV 88.2  PLT 212   Basic Metabolic Panel: Recent Labs  Lab 06/16/24 0352  NA 137  K 3.0*  CL 104  CO2 23  GLUCOSE 99  BUN 16  CREATININE 1.06*  CALCIUM  8.7*   GFR: CrCl cannot be calculated (Unknown ideal weight.). Liver Function Tests: Recent Labs  Lab 06/16/24 0352  AST 17  ALT 10  ALKPHOS 75  BILITOT 0.9  PROT 6.6  ALBUMIN 3.8   No results for  input(s): LIPASE, AMYLASE in the last 168 hours. No results for input(s): AMMONIA in the last 168 hours. Coagulation Profile: No results for input(s): INR, PROTIME in the last 168 hours. Cardiac Enzymes: No results for input(s): CKTOTAL, CKMB, CKMBINDEX, TROPONINI in the last 168 hours. BNP (last 3 results) No results for input(s): PROBNP in the last 8760 hours. HbA1C: No results for input(s): HGBA1C in the last 72 hours. CBG: No results for input(s): GLUCAP in the last 168 hours. Lipid Profile: No results for input(s): CHOL, HDL, LDLCALC, TRIG, CHOLHDL, LDLDIRECT in the last 72 hours. Thyroid  Function Tests: No results for input(s): TSH, T4TOTAL, FREET4, T3FREE, THYROIDAB in the last 72 hours. Anemia Panel: No results for input(s): VITAMINB12, FOLATE, FERRITIN, TIBC, IRON, RETICCTPCT in the last 72 hours. Urine analysis:    Component Value Date/Time   COLORURINE YELLOW 02/20/2024 1256   APPEARANCEUR CLEAR 02/20/2024 1256   APPEARANCEUR Clear 07/08/2014 2155   LABSPEC 1.010 02/20/2024 1256   LABSPEC 1.003 07/08/2014 2155   PHURINE 7.0 02/20/2024 1256   GLUCOSEU NEGATIVE 02/20/2024 1256   GLUCOSEU Negative 07/08/2014 2155   HGBUR NEGATIVE 02/20/2024 1256   BILIRUBINUR NEGATIVE 02/20/2024 1256   BILIRUBINUR Negative 07/08/2014 2155   KETONESUR NEGATIVE 02/20/2024 1256   PROTEINUR NEGATIVE 02/20/2024 1256   NITRITE NEGATIVE 02/20/2024 1256   LEUKOCYTESUR MODERATE (A) 02/20/2024 1256   LEUKOCYTESUR Negative 07/08/2014 2155    Radiological Exams on Admission: CT Angio Chest PE W/Cm &/Or Wo Cm Result Date: 06/16/2024 EXAM: CTA of the Chest with contrast for PE 06/16/2024 05:47:09 AM TECHNIQUE: CTA of the chest was performed without and with the administration of 75 mL of iohexol  (OMNIPAQUE ) 350 MG/ML injection. Multiplanar reformatted images are provided for review. MIP images are provided for review. Automated exposure  control, iterative reconstruction, and/or weight based adjustment of the mA/kV was utilized to reduce the radiation dose to as low as reasonably achievable. COMPARISON: 01/28/2021 CLINICAL HISTORY: Pulmonary embolism (PE) suspected, low to intermediate prob, positive D-dimer. FINDINGS: PULMONARY ARTERIES: Pulmonary arteries are adequately opacified for evaluation. No pulmonary embolism. Main pulmonary artery is normal in caliber. MEDIASTINUM: Mild cardiac enlargement. Coronary artery calcifications. No pericardial effusion with mild interstitial edema . Aortic atherosclerosis. No acute  abnormality of the thoracic aorta. LYMPH NODES: Right hilar lymph node measures 1.3 cm, image 112/5. Right paratracheal lymph node measures 1.5 cm, image 82/5. No axillary lymphadenopathy. LUNGS AND PLEURA: Diffuse bronchial wall thickening identified. No consolidative change. Small bilateral pleural effusions. No pneumothorax. UPPER ABDOMEN: Limited images of the upper abdomen are unremarkable. SOFT TISSUES AND BONES: The bones appear diffusely osteopenic. Thoracic curvature with convexity towards the right. Multilevel degenerative disc disease within the thoracic spine. No acute soft tissue abnormality. IMPRESSION: 1. No pulmonary embolism. 2. Small bilateral pleural effusions with mild interstitial edema concerning for chf. . 3. Right hilar and right paratracheal lymphadenopathy. This is nonspecific and may be seen in the setting of chf. Consider follow-up imaging with repeat CT of the chest in 3 months to ensure resolution. 4. Cardiac enlargement, aortic atherosclerosis, and coronary artery calcifications. Electronically signed by: Waddell Calk MD 06/16/2024 07:12 AM EST RP Workstation: [id number]   DG Chest Port 1 View Result Date: 06/16/2024 EXAM: 1 VIEW(S) XRAY OF THE CHEST 06/16/2024 04:06:03 AM COMPARISON: 02/18/2024 CLINICAL HISTORY: shob FINDINGS: LUNGS AND PLEURA: Patchy airspace opacity in right mid lung. Chronic  coarsened interstitial markings. Unchanged calcified granuloma in left upper lobe. No pulmonary edema. No pleural effusion. No pneumothorax. HEART AND MEDIASTINUM: Mildly enlarged cardiomediastinal silhouette. Tortuous descending thoracic aorta. Atherosclerotic calcifications. BONES AND SOFT TISSUES: Degenerative changes of shoulders. No acute osseous abnormality. IMPRESSION: 1. Patchy right mid-lung airspace opacity, suspicious for infection. 2. Chronic interstitial lung changes, aortic tortuosity and mild cardiomegaly. Electronically signed by: Helayne Hurst MD 06/16/2024 04:44 AM EST RP Workstation: [id number]    EKG: Independently reviewed.  Sinus rhythm, chronic RBBB with nonspecific ST changes in multiple leads.  Assessment/Plan Principal Problem:   CHF (congestive heart failure) (HCC) Active Problems:   Acute on chronic diastolic CHF (congestive heart failure) (HCC)  (please populate well all problems here in Problem List. (For example, if patient is on BP meds at home and you resume or decide to hold them, it is a problem that needs to be her. Same for CAD, COPD, HLD and so on)  Acute on chronic HFpEF decompensation - Continue IV diuresis Lasix 20 mg twice daily - Discontinue amlodipine  due to worsening of peripheral edema - Change metoprolol  to Coreg to address uncontrolled hypertension - Continue ARB - Echocardiogram was done 3 months ago, will not repeat echo at this time.  HTN, uncontrolled - As above  Deconditioning - PT evaluation  History of seizures/seizure-like activity - No acute concern, continue Lamictal   DVT prophylaxis: Lovenox  subcu Code Status: DNR/DNI Family Communication: None at bedside Disposition Plan: Patient is sick with CHF decompensation requiring IV diuresis, expect more than 2 midnight hospital stay Consults called: None Admission status: Telemetry admission   Cort ONEIDA Mana MD [facility] Pager [phone]  06/16/2024, 9:06 AM

## 2024-06-16 NOTE — Evaluation (Addendum)
 Physical Therapy Evaluation Patient Details Name: Kellie Harris MRN: [id number] DOB: 10-29-1930 Today's Date: 06/16/2024  History of Present Illness  Kellie Harris is a 89yoF who comes to [facility] on 06/16/24 with sudden acute onset dyspnea while sleeping, also having acute LEE. Pt reside at [facility] x3 months now. PMH:HTN, chronic HFpEF, OA,, HTN, HLD, IBS. HOH, recurrent TIA v seizure like activity followed by neurology. At baseline pt AMB household distances with 4WW, no O2, no recent falls or close calls.  Clinical Impression  Pt in bed on arrival, DTR at bedside. Pt appears close to function baseline for bed mobility, basic transfers, and taking steps along bedside, however she does not feel very good, likely ted in to arriving her just after 3:30a. Pt has acute O2 needs, desats to 84% on room air at EOB, 75% AMB at bedside, but has only mild symptoms with hypoxia. Pt may need a little additional support upn return to ALF, would benefit from HHPT there at DC, but with ample mobility opportunities here and rest, should steadily begin to move better each day. Will continue to follow.        If plan is discharge home, recommend the following: A little help with walking and/or transfers;Assist for transportation;Help with stairs or ramp for entrance;A little help with bathing/dressing/bathroom   Can travel by private vehicle        Equipment Recommendations None recommended by PT  Recommendations for Other Services       Functional Status Assessment Patient has not had a recent decline in their functional status     Precautions / Restrictions Precautions Precautions: Fall Restrictions Weight Bearing Restrictions Per Provider Order: No      Mobility  Bed Mobility Overal bed mobility: Modified Independent             General bed mobility comments: near baseline performance    Transfers Overall transfer level: Modified independent Equipment used: None, Rolling walker (2 wheels)                General transfer comment: able to perform 5xSTS hands ad lib from edge of bed, 10.98sec. (despite acute fatigue, sleep deprivation)    Ambulation/Gait Ambulation/Gait assistance: Supervision Gait Distance (Feet): 12 Feet Assistive device: Rolling walker (2 wheels) Gait Pattern/deviations: WFL(Within Functional Limits), Step-to pattern       General Gait Details: takes some steps forward and backward at bedside with RW, no LOB, trial on room air unsuccessful with desaturation to 84%  Stairs            Wheelchair Mobility     Tilt Bed    Modified Rankin (Stroke Patients Only)       Balance                                             Pertinent Vitals/Pain Pain Assessment Pain Assessment: No/denies pain    Home Living Family/patient expects to be discharged to:: Assisted living                 Home Equipment: Rollator (4 wheels) Additional Comments: [name] DTR    Prior Function Prior Level of Function : Independent/Modified Independent             Mobility Comments: modI AMB in ALF c 4WW, manages her own bathing dressing with standby assist when showering; Has been at [facility]  since July 2025       Extremity/Trunk Assessment                Communication        Cognition Arousal: Alert Behavior During Therapy: WFL for tasks assessed/performed   PT - Cognitive impairments: No apparent impairments                                 Cueing       General Comments      Exercises     Assessment/Plan    PT Assessment Patient needs continued PT services  PT Problem List Decreased strength;Decreased range of motion;Decreased activity tolerance;Decreased balance;Decreased mobility       PT Treatment Interventions DME instruction;Gait training;Functional mobility training;Therapeutic activities;Therapeutic exercise;Balance training;Neuromuscular re-education;Stair  training;Patient/family education    PT Goals (Current goals can be found in the Care Plan section)  Acute Rehab PT Goals Patient Stated Goal: achieve rest, regain vigor, DC from hospital PT Goal Formulation: With patient Time For Goal Achievement: 06/30/24 Potential to Achieve Goals: Good    Frequency Min 2X/week     Co-evaluation               AM-PAC PT 6 Clicks Mobility  Outcome Measure Help needed turning from your back to your side while in a flat bed without using bedrails?: None Help needed moving from lying on your back to sitting on the side of a flat bed without using bedrails?: None Help needed moving to and from a bed to a chair (including a wheelchair)?: A Little Help needed standing up from a chair using your arms (e.g., wheelchair or bedside chair)?: A Little Help needed to walk in hospital room?: A Little Help needed climbing 3-5 steps with a railing? : A Little 6 Click Score: 20    End of Session Equipment Utilized During Treatment: Oxygen Activity Tolerance: Patient tolerated treatment well;No increased pain;Patient limited by fatigue Patient left: in bed;with family/visitor present;with call bell/phone within reach Nurse Communication: Mobility status PT Visit Diagnosis: Difficulty in walking, not elsewhere classified (R26.2);Other abnormalities of gait and mobility (R26.89);Muscle weakness (generalized) (M62.81)    Time: 8695-8668 PT Time Calculation (min) (ACUTE ONLY): 27 min   Charges:   PT Evaluation $PT Eval Low Complexity: 1 Low PT Treatments $Therapeutic Activity: 8-22 mins PT General Charges $$ ACUTE PT VISIT: 1 Visit       1:55 PM, 06/16/24 Peggye JAYSON Linear, PT, DPT Physical Therapist - [facility]  [phone] (ASCOM)    Lillias Difrancesco C 06/16/2024, 1:52 PM

## 2024-06-16 NOTE — ED Provider Notes (Signed)
 [facility] Provider Note    Event Date/Time   First MD Initiated Contact with Patient 06/16/24 0340     (approximate)   History   Shortness of Breath   HPI  Kellie Harris is a 89 y.o. female   Past medical history of hypertension hyperlipidemia, here with shortness of breath that woke her from sleep.  Went to bed feeling well with no recent illnesses, trauma, or medical complaints.  In the middle the night awoke feeling short of breath.  No associated cough or chest pain.  She has however noticed that both of her legs are swelling more than usual, lately.  Feels somewhat better now that she is in the emergency department.  No recent travel immobilizations or history of blood clot.   External Medical Documents Reviewed: Outpatient notes from neurology recently      Physical Exam   Triage Vital Signs: ED Triage Vitals  Encounter Vitals Group     BP 06/16/24 0346 (!) 159/86     Girls Systolic BP Percentile --      Girls Diastolic BP Percentile --      Boys Systolic BP Percentile --      Boys Diastolic BP Percentile --      Pulse Rate 06/16/24 0346 77     Resp 06/16/24 0346 20     Temp 06/16/24 0346 (!) 97.4 F (36.3 C)     Temp Source 06/16/24 0346 Oral     SpO2 06/16/24 0346 100 %     Weight --      Height --      Head Circumference --      Peak Flow --      Pain Score 06/16/24 0345 7     Pain Loc --      Pain Education --      Exclude from Growth Chart --     Most recent vital signs: Vitals:   06/16/24 0346  BP: (!) 159/86  Pulse: 77  Resp: 20  Temp: (!) 97.4 F (36.3 C)  SpO2: 100%    General: Awake, no distress.  CV:  Good peripheral perfusion.  Resp:  Normal effort.  Abd:  No distention.  Other:  Breathing comfortably on room air, no respiratory distress, slightly hypertensive otherwise vital signs normal.  Auscultation of the chest reveals no murmurs of the heart, no focality or wheezing of the lung fields.  Skin  appears warm well-perfused.  Speaking in full sentences awake alert and oriented.  Soft benign abdominal exam deep palpation all quadrants.  Bilateral lower extremity edema is equal on both sides.   ED Results / Procedures / Treatments   Labs (all labs ordered are listed, but only abnormal results are displayed) Labs Reviewed  BRAIN NATRIURETIC PEPTIDE - Abnormal; Notable for the following components:      Result Value   B Natriuretic Peptide 369.8 (*)    All other components within normal limits  COMPREHENSIVE METABOLIC PANEL WITH GFR - Abnormal; Notable for the following components:   Potassium 3.0 (*)    Creatinine, Ser 1.06 (*)    Calcium  8.7 (*)    GFR, Estimated 50 (*)    All other components within normal limits  CBC WITH DIFFERENTIAL/PLATELET - Abnormal; Notable for the following components:   Hemoglobin 11.8 (*)    HCT 35.9 (*)    RDW 15.6 (*)    All other components within normal limits  D-DIMER, QUANTITATIVE - Abnormal; Notable for  the following components:   D-Dimer, Quant 0.98 (*)    All other components within normal limits  RESP PANEL BY RT-PCR (RSV, FLU A&B, COVID)  [id number]  TROPONIN I (HIGH SENSITIVITY)  TROPONIN I (HIGH SENSITIVITY)     I ordered and reviewed the above labs they are notable for BNP mildly elevated at 370, D-dimer elevated after age adjustment.  EKG  ED ECG REPORT I, Ginnie Shams, the attending physician, personally viewed and interpreted this ECG.   Date: 06/16/2024  EKG Time: 0350  Rate: 75  Rhythm: sinus  Axis: rbbb  Intervals:nl  ST&T Change: no stemi    RADIOLOGY I independently reviewed and interpreted chest x-ray and I see no obvious focality / pneumothorax I also reviewed radiologist's formal read.   PROCEDURES:  Critical Care performed: No  Procedures   MEDICATIONS ORDERED IN ED: Medications  potassium chloride  SA (KLOR-CON  M) CR tablet 40 mEq (has no administration in time range)  iohexol  (OMNIPAQUE ) 350 MG/ML  injection 75 mL (75 mLs Intravenous Contrast Given 06/16/24 0525)     IMPRESSION / MDM / ASSESSMENT AND PLAN / ED COURSE  I reviewed the triage vital signs and the nursing notes.                                Patient's presentation is most consistent with acute presentation with potential threat to life or bodily function.  Differential diagnosis includes, but is not limited to, ACS, dissection, PE, respiratory infection, dysrhythmia, anxiety related   The patient is on the cardiac monitor to evaluate for evidence of arrhythmia and/or significant heart rate changes.  MDM:    89 year old with acute onset shortness of breath that is much improved without intervention now that she is in the emergency department.  No associated chest pain.  And concern for early respiratory infection versus pneumothorax spontaneous versus PE.  D-dimer age-adjusted is elevated we will proceed with CT angiogram of the chest.  Initial chest x-ray with questionable right patchy opacity in the lung field, will hold off on antibiotics until advanced imaging can further elucidate.  Bilateral lower extremity edema with no history of heart failure may be concerning for heart failure especially in light of mildly elevated BNP.  Will further characterize with CT angiogram to see if there is any evidence of pulmonary edema.   -- At the time of signout at 7 AM, the results of her CT angiogram and repeat troponin are pending.  Patient is resting comfortably at this time.        FINAL CLINICAL IMPRESSION(S) / ED DIAGNOSES   Final diagnoses:  SOB (shortness of breath)     Rx / DC Orders   ED Discharge Orders     None        Note:  This document was prepared using Dragon voice recognition software and may include unintentional dictation errors.    Shams Ginnie, MD 06/16/24 [phone]

## 2024-06-16 NOTE — ED Provider Notes (Signed)
  Physical Exam  BP (!) 159/86 (BP Location: Left Arm)   Pulse 79   Temp (!) 97.4 F (36.3 C) (Oral)   Resp 18   SpO2 97%   Physical Exam  Procedures  Procedures  ED Course / MDM   Clinical Course as of 06/16/24 0750  Tue Jun 16, 2024  2768 S/o: 89 year old female presents with waking feeling short of breath, symptomatic prior to presentation -Mild bilateral lower extremity edema with mildly elevated BNP, chest x-ray with no significant pulmonary edema -Labs with elevated D-dimer, CT PE pending -Troponin normal, repeat pending -No leukocytosis -Viral swab negative -Currently asymptomatic  TO DO: - f/u CTPE, rpt trop [MM]  0716 CTPE: IMPRESSION: 1. No pulmonary embolism. 2. Small bilateral pleural effusions with mild interstitial edema concerning for chf. . 3. Right hilar and right paratracheal lymphadenopathy. This is nonspecific and may be seen in the setting of chf. Consider follow-up imaging with repeat CT of the chest in 3 months to ensure resolution. 4. Cardiac enlargement, aortic atherosclerosis, and coronary artery calcifications.   [MM]  0717 Last echo 02/2024: IMPRESSIONS     1. Left ventricular ejection fraction, by estimation, is 60 to 65%. The  left ventricle has normal function. The left ventricle has no regional  wall motion abnormalities. Left ventricular diastolic parameters are  consistent with Grade I diastolic  dysfunction (impaired relaxation).   2. Right ventricular systolic function is normal. The right ventricular  size is normal.   3. The mitral valve is normal in structure. Trivial mitral valve  regurgitation.   4. The aortic valve is normal in structure. Aortic valve regurgitation is  mild to moderate.   [MM]  [phone] Patient reevaluated, oxygen saturations 88% - 95% at rest with good waveform on room air.  Is not on any diuretics at baseline.  In shared decision making, she is most comfortable with admission as opposed to diuresis and  discharged home which I believe is very reasonable.   Will give dose of Lasix.  Mild hypokalemia, already repleted p.o. Hospitalist consult order placed. [MM]    Clinical Course User Index [MM] Clarine Ozell LABOR, MD   Medical Decision Making Amount and/or Complexity of Data Reviewed Labs: ordered. Radiology: ordered.  Risk Prescription drug management. Decision regarding hospitalization.          Clarine Ozell LABOR, MD 06/16/24 [phone]

## 2024-06-16 NOTE — ED Notes (Signed)
 At this time, this EDT, Geographical Information Systems Officer, and a nursing student, Lauraine, helped this pt clean up after pt stated she had a BM and had vomited on herself. We provided peri care, a full linen change, and pt is now comfortable in bed at lowest position.

## 2024-06-16 NOTE — Plan of Care (Signed)
  Problem: Clinical Measurements: Goal: Ability to maintain clinical measurements within normal limits will improve Outcome: Progressing   Problem: Clinical Measurements: Goal: Respiratory complications will improve Outcome: Progressing   Problem: Activity: Goal: Risk for activity intolerance will decrease Outcome: Progressing   Problem: Pain Managment: Goal: General experience of comfort will improve and/or be controlled Outcome: Progressing   Problem: Safety: Goal: Ability to remain free from injury will improve Outcome: Progressing

## 2024-06-16 NOTE — ED Notes (Signed)
 Pt incontinent of bowel. This tech changed pt linen's, purewick, brief, and bedpads.

## 2024-06-16 NOTE — ED Triage Notes (Signed)
 Pt arrives via EMS from [facility] reporting pt woke up coughing and felt like she could not catch her breath, pt denies recent illness.denies acute pain, dizziness, or n/v. Pt does report new swelling in BLE.

## 2024-06-17 ENCOUNTER — Observation Stay

## 2024-06-17 DIAGNOSIS — I10 Essential (primary) hypertension: Secondary | ICD-10-CM

## 2024-06-17 DIAGNOSIS — I130 Hypertensive heart and chronic kidney disease with heart failure and stage 1 through stage 4 chronic kidney disease, or unspecified chronic kidney disease: Secondary | ICD-10-CM | POA: Diagnosis not present

## 2024-06-17 DIAGNOSIS — I509 Heart failure, unspecified: Secondary | ICD-10-CM | POA: Diagnosis not present

## 2024-06-17 DIAGNOSIS — I5032 Chronic diastolic (congestive) heart failure: Secondary | ICD-10-CM | POA: Diagnosis not present

## 2024-06-17 DIAGNOSIS — R197 Diarrhea, unspecified: Secondary | ICD-10-CM | POA: Diagnosis not present

## 2024-06-17 DIAGNOSIS — E7800 Pure hypercholesterolemia, unspecified: Secondary | ICD-10-CM | POA: Diagnosis not present

## 2024-06-17 DIAGNOSIS — Z8673 Personal history of transient ischemic attack (TIA), and cerebral infarction without residual deficits: Secondary | ICD-10-CM | POA: Diagnosis not present

## 2024-06-17 DIAGNOSIS — Z66 Do not resuscitate: Secondary | ICD-10-CM | POA: Diagnosis not present

## 2024-06-17 DIAGNOSIS — R918 Other nonspecific abnormal finding of lung field: Secondary | ICD-10-CM | POA: Diagnosis not present

## 2024-06-17 DIAGNOSIS — E876 Hypokalemia: Secondary | ICD-10-CM | POA: Diagnosis not present

## 2024-06-17 DIAGNOSIS — R569 Unspecified convulsions: Secondary | ICD-10-CM | POA: Diagnosis not present

## 2024-06-17 DIAGNOSIS — I110 Hypertensive heart disease with heart failure: Secondary | ICD-10-CM | POA: Diagnosis not present

## 2024-06-17 DIAGNOSIS — R0902 Hypoxemia: Secondary | ICD-10-CM | POA: Diagnosis not present

## 2024-06-17 DIAGNOSIS — Z823 Family history of stroke: Secondary | ICD-10-CM | POA: Diagnosis not present

## 2024-06-17 DIAGNOSIS — A0472 Enterocolitis due to Clostridium difficile, not specified as recurrent: Secondary | ICD-10-CM | POA: Diagnosis not present

## 2024-06-17 DIAGNOSIS — Z96642 Presence of left artificial hip joint: Secondary | ICD-10-CM | POA: Diagnosis not present

## 2024-06-17 DIAGNOSIS — Z1152 Encounter for screening for COVID-19: Secondary | ICD-10-CM | POA: Diagnosis not present

## 2024-06-17 DIAGNOSIS — Z806 Family history of leukemia: Secondary | ICD-10-CM | POA: Diagnosis not present

## 2024-06-17 DIAGNOSIS — J9601 Acute respiratory failure with hypoxia: Secondary | ICD-10-CM | POA: Diagnosis not present

## 2024-06-17 DIAGNOSIS — Z7982 Long term (current) use of aspirin: Secondary | ICD-10-CM | POA: Diagnosis not present

## 2024-06-17 DIAGNOSIS — Z79899 Other long term (current) drug therapy: Secondary | ICD-10-CM | POA: Diagnosis not present

## 2024-06-17 DIAGNOSIS — Z803 Family history of malignant neoplasm of breast: Secondary | ICD-10-CM | POA: Diagnosis not present

## 2024-06-17 DIAGNOSIS — N1831 Chronic kidney disease, stage 3a: Secondary | ICD-10-CM | POA: Diagnosis not present

## 2024-06-17 DIAGNOSIS — J181 Lobar pneumonia, unspecified organism: Secondary | ICD-10-CM | POA: Insufficient documentation

## 2024-06-17 DIAGNOSIS — Z974 Presence of external hearing-aid: Secondary | ICD-10-CM | POA: Diagnosis not present

## 2024-06-17 DIAGNOSIS — M159 Polyosteoarthritis, unspecified: Secondary | ICD-10-CM | POA: Diagnosis not present

## 2024-06-17 DIAGNOSIS — M8580 Other specified disorders of bone density and structure, unspecified site: Secondary | ICD-10-CM | POA: Diagnosis not present

## 2024-06-17 DIAGNOSIS — R0989 Other specified symptoms and signs involving the circulatory and respiratory systems: Secondary | ICD-10-CM | POA: Diagnosis not present

## 2024-06-17 DIAGNOSIS — T501X5A Adverse effect of loop [high-ceiling] diuretics, initial encounter: Secondary | ICD-10-CM | POA: Diagnosis not present

## 2024-06-17 DIAGNOSIS — I5033 Acute on chronic diastolic (congestive) heart failure: Secondary | ICD-10-CM | POA: Diagnosis not present

## 2024-06-17 DIAGNOSIS — I700 Atherosclerosis of aorta: Secondary | ICD-10-CM | POA: Diagnosis not present

## 2024-06-17 DIAGNOSIS — Z96651 Presence of right artificial knee joint: Secondary | ICD-10-CM | POA: Diagnosis not present

## 2024-06-17 LAB — C DIFFICILE QUICK SCREEN W PCR REFLEX
C Diff antigen: POSITIVE — AB
C Diff toxin: NEGATIVE

## 2024-06-17 LAB — GASTROINTESTINAL PANEL BY PCR, STOOL (REPLACES STOOL CULTURE)

## 2024-06-17 LAB — BASIC METABOLIC PANEL WITH GFR
Anion gap: 13 (ref 5–15)
BUN: 14 mg/dL (ref 8–23)
CO2: 22 mmol/L (ref 22–32)
Calcium: 7.6 mg/dL — ABNORMAL LOW (ref 8.9–10.3)
Chloride: 104 mmol/L (ref 98–111)
Creatinine, Ser: 0.9 mg/dL (ref 0.44–1.00)
GFR, Estimated: >60 mL/min (ref >60)
Glucose, Bld: 82 mg/dL (ref 70–99)
Potassium: 2.5 mmol/L — CL (ref 3.5–5.1)
Sodium: 139 mmol/L (ref 135–145)

## 2024-06-17 LAB — MAGNESIUM: Magnesium: 1.6 mg/dL — ABNORMAL LOW (ref 1.7–2.4)

## 2024-06-17 LAB — CLOSTRIDIUM DIFFICILE BY PCR, REFLEXED
Hypervirulent Strain: NEGATIVE
Toxigenic C. Difficile by PCR: POSITIVE — AB

## 2024-06-17 MED ORDER — POTASSIUM CHLORIDE CRYS ER 20 MEQ PO TBCR: 40.0000 meq | EXTENDED_RELEASE_TABLET | Freq: Once | ORAL | Status: DC

## 2024-06-17 MED ORDER — POTASSIUM CHLORIDE 10 MEQ/100ML IV SOLN: 10.0000 meq | INTRAVENOUS | Status: DC

## 2024-06-17 MED ORDER — POTASSIUM CHLORIDE 10 MEQ/100ML IV SOLN
10.0000 meq | INTRAVENOUS | Status: AC
  Administered 2024-06-17 (×2): 10 meq via INTRAVENOUS

## 2024-06-17 MED ORDER — LOPERAMIDE HCL 2 MG PO CAPS
4.0000 mg | ORAL_CAPSULE | Freq: Three times a day (TID) | ORAL | Status: DC | PRN
  Administered 2024-06-17: 4 mg via ORAL
  Filled 2024-06-17: qty 2

## 2024-06-17 MED ORDER — POTASSIUM CHLORIDE 20 MEQ PO PACK
40.0000 meq | PACK | Freq: Once | ORAL | Status: AC
  Administered 2024-06-17: 40 meq via ORAL
  Filled 2024-06-17: qty 2

## 2024-06-17 MED ORDER — POTASSIUM CHLORIDE 10 MEQ/100ML IV SOLN
10.0000 meq | INTRAVENOUS | Status: AC
  Administered 2024-06-17: 10 meq via INTRAVENOUS
  Filled 2024-06-17 (×3): qty 100

## 2024-06-17 MED ORDER — DOXYCYCLINE HYCLATE 100 MG PO TABS
100.0000 mg | ORAL_TABLET | Freq: Two times a day (BID) | ORAL | Status: DC
  Administered 2024-06-18: 100 mg via ORAL
  Filled 2024-06-17: qty 1

## 2024-06-17 MED ORDER — FUROSEMIDE 10 MG/ML IJ SOLN
20.0000 mg | Freq: Every day | INTRAMUSCULAR | Status: DC
  Administered 2024-06-18: 20 mg via INTRAVENOUS
  Filled 2024-06-17: qty 2

## 2024-06-17 MED ORDER — ENOXAPARIN SODIUM 30 MG/0.3ML IJ SOSY
30.0000 mg | PREFILLED_SYRINGE | INTRAMUSCULAR | Status: DC
  Administered 2024-06-17 – 2024-06-22 (×6): 30 mg via SUBCUTANEOUS
  Filled 2024-06-17 (×6): qty 0.3

## 2024-06-17 MED ORDER — AZITHROMYCIN 250 MG PO TABS
500.0000 mg | ORAL_TABLET | Freq: Every day | ORAL | Status: AC
  Administered 2024-06-17: 500 mg via ORAL
  Filled 2024-06-17: qty 2

## 2024-06-17 MED ORDER — AZITHROMYCIN 250 MG PO TABS: 250.0000 mg | ORAL_TABLET | Freq: Every day | ORAL | Status: DC

## 2024-06-17 MED ORDER — MAGNESIUM SULFATE 2 GM/50ML IV SOLN
2.0000 g | Freq: Once | INTRAVENOUS | Status: AC
  Administered 2024-06-17: 2 g via INTRAVENOUS
  Filled 2024-06-17: qty 50

## 2024-06-17 MED ORDER — SODIUM CHLORIDE 0.9 % IV SOLN
2.0000 g | INTRAVENOUS | Status: DC
  Administered 2024-06-17: 2 g via INTRAVENOUS
  Filled 2024-06-17: qty 20

## 2024-06-17 MED ORDER — VANCOMYCIN HCL 125 MG PO CAPS
125.0000 mg | ORAL_CAPSULE | Freq: Four times a day (QID) | ORAL | Status: DC
  Administered 2024-06-17 – 2024-06-23 (×21): 125 mg via ORAL
  Filled 2024-06-17 (×25): qty 1

## 2024-06-17 NOTE — Progress Notes (Signed)
 Progress Note   Patient: Kellie Harris FMW:[id number] DOB: [birth year] DOA: 06/16/2024     0 DOS: the patient was seen and examined on 06/17/2024   Brief hospital course: 89 y.o. female with medical history significant of HTN, chronic HFpEF, multiple OA's, HTN, HLD, IBS presented with worsening of shortness of breath and leg swelling.   Symptoms started about 1 week ago, patient started to notice increasing ankle edema as well as Glasner cough and increasing exertional dyspnea.  She denied any chest pain, no fever or chills.  Last night, she could not lay flat on the bed because of increasing shortness of breath and decided to come to ED. patient also reported that last 7 days she has had a flareup of her IBS with diarrhea watery problems 1-2 times a day but denied any abdominal pain, she has tried Imodium  with some improvement.   ED Course: Afebrile, tachycardia blood pressure slightly elevated 160/80 O2 saturation 100% on room air.  CTA negative for PE but mild pulmonary edema, blood work showed K3.0 bicarb 25 BUN 16 creatinine 1.0, WBC 8.9 hemoglobin 11.8, COVID-negative.   Patient was given IV Lasix 20 mg x 1 in the ED.  11/5.  Patient's daughter stated that diarrhea has been worse and foul-smelling.  Will send off stool studies.  Started on Zithromax and Rocephin for patchy infiltrate on chest x-ray.  Replacing magnesium  and potassium.  Continue Lasix.  Assessment and Plan: * Acute on chronic diastolic CHF (congestive heart failure) (HCC) Continue IV Lasix.  TED hose.  Patient on low-dose Coreg and losartan .  Hypokalemia Replace potassium IV and orally  Hypomagnesemia Replace magnesium  IV  Diarrhea As per daughter, has been getting worse.  Will send off stool studies.  Hold off on further Imodium .  Lobar pneumonia Rocephin and Zithromax.  Hypoxia Pulse ox dropped down into the high 80s with ambulation.  Essential hypertension Blood pressure better controlled        Subjective:  Patient feeling a little bit better.  Does not like the potassium supplementation but with potassium being low will have to do it.  Patient had an episode of diarrhea.  Has irritable bowel.  Some cough.  Physical Exam: Vitals:   06/17/24 0350 06/17/24 0525 06/17/24 0923 06/17/24 0927  BP: 138/64  129/76 129/76  Pulse: 72  82 82  Resp: 18  18   Temp: 98.2 F (36.8 C)  98.2 F (36.8 C)   TempSrc:      SpO2: 95%  100%   Weight:  43.9 kg    Height:       Physical Exam HENT:     Head: Normocephalic.  Eyes:     General: Lids are normal.     Conjunctiva/sclera: Conjunctivae normal.  Cardiovascular:     Rate and Rhythm: Normal rate and regular rhythm.     Heart sounds: Normal heart sounds, S1 normal and S2 normal.  Pulmonary:     Breath sounds: Examination of the right-lower field reveals decreased breath sounds. Examination of the left-lower field reveals decreased breath sounds. Decreased breath sounds present. No wheezing, rhonchi or rales.  Abdominal:     Palpations: Abdomen is soft.     Tenderness: There is no abdominal tenderness.  Musculoskeletal:     Right lower leg: Swelling present.     Left lower leg: Swelling present.  Skin:    General: Skin is warm.  Neurological:     Mental Status: She is alert and oriented to person, place,  and time.     Data Reviewed: Potassium 2.5, creatinine 0.9, magnesium  abdomen 1.6, last hemoglobin 11.8  Family Communication: Updated patient's daughter on the phone  Disposition: Status is: Observation Continue IV Lasix today.  Unable to come off oxygen this morning.  Will try again tomorrow.  Planned Discharge Destination: Home    Time spent: 28 minutes  Author: [name], MD 06/17/2024 11:32 AM  For on call review [email or web address].

## 2024-06-17 NOTE — Care Management Obs Status (Signed)
 MEDICARE OBSERVATION STATUS NOTIFICATION   Patient Details  Name: Kellie Harris MRN: [id number] Date of Birth: 11-May-1931   Medicare Observation Status Notification Given:  Yes    Rojelio SHAUNNA Rattler 06/17/2024, 2:26 PM

## 2024-06-17 NOTE — Hospital Course (Addendum)
 89 y.o. female with medical history significant of HTN, chronic HFpEF, multiple OA's, HTN, HLD, IBS presented with worsening of shortness of breath and leg swelling.   Symptoms started about 1 week ago, patient started to notice increasing ankle edema as well as Wages cough and increasing exertional dyspnea.  She denied any chest pain, no fever or chills.  Last night, she could not lay flat on the bed because of increasing shortness of breath and decided to come to ED. patient also reported that last 7 days she has had a flareup of her IBS with diarrhea watery problems 1-2 times a day but denied any abdominal pain, she has tried Imodium  with some improvement.   ED Course: Afebrile, tachycardia blood pressure slightly elevated 160/80 O2 saturation 100% on room air.  CTA negative for PE but mild pulmonary edema, blood work showed K3.0 bicarb 25 BUN 16 creatinine 1.0, WBC 8.9 hemoglobin 11.8, COVID-negative.   Patient was given IV Lasix 20 mg x 1 in the ED.  11/5.  Patient's daughter stated that diarrhea has been worse and foul-smelling.  Will send off stool studies.  Started on Zithromax and Rocephin for patchy infiltrate on chest x-ray.  Replacing magnesium  and potassium.  Continue Lasix. 11/6.  C. difficile diagnosed yesterday and started on vancomycin .  Discontinue antibiotics, less likely pneumonia. 11/7.  Had 3 bowel movements yesterday still grade 6. 11/8.  Had 3 bowel movements overnight and did not have control of them.  Continue treatment for C. difficile. 11/9. Had uncontrollable BM overnight 11/10.  Patient hypoxic after walking. ABG showed po2 of 68.  Gave dose iv lasix. 11/11.  Unable to come off oxygen, will set up with home oxygen.  Bowel movements are stage 5 and able to be controlled.

## 2024-06-17 NOTE — Assessment & Plan Note (Deleted)
 Pulse ox dropped down into the high 80s with ambulation earlier in the hospital course.  Patient was able to hold her saturations with ambulation on 11/7.

## 2024-06-17 NOTE — Assessment & Plan Note (Addendum)
 With creatinine up to 1.02 will hold off on further Lasix.  TED hose.  Patient on low-dose Coreg and losartan .

## 2024-06-17 NOTE — Assessment & Plan Note (Signed)
 As per daughter, has been getting worse.  Will send off stool studies.  Hold off on further Imodium .

## 2024-06-17 NOTE — Evaluation (Signed)
 Occupational Therapy Evaluation Patient Details Name: Kellie Harris MRN: [id number] DOB: [birth year] Today's Date: 06/17/2024   History of Present Illness   Kellie Harris is a 89yoF who comes to [facility] on 06/16/24 with sudden acute onset dyspnea while sleeping, also having acute LEE. Pt reside at [facility] x3 months now. PMH:HTN, chronic HFpEF, OA,, HTN, HLD, IBS. HOH, recurrent TIA v seizure like activity followed by neurology. At baseline pt AMB household distances with 4WW, no O2, no recent falls or close calls.     Clinical Impressions Patient was seen for OT evaluation this date. Prior to hospital admission, patient was managing basic ADLs while living in ALF, ambulating with rw. Patient semifowlers in bed at start of viist on 2L, RN asking for OT to trial on RA, OT agreeable. Patient A&O x 4, HOH and does not have battery for hearing aids. Patient very pleasant and cooperative throughout tx. Patient performed bed mobility without difficulty, while sitting EOB on RA, she remained above 94%; she performed LB dressing (socks, shoes and underpants) without A and O2 dropped to 88%, improved with cues for PLB. Patient ambulated with SBA with r/w to bathroom on RA, O2 dropped to 86% but quickly returned to >90% while seated for toileting.  Patient presents with deficits in standing tolerance/balance, gross strength and functional endurance, affecting safe and optimal ADL completion. Patient is currently requiring mod A for sit<>stand primarily due to RUE IV placement and increased time to manage basic ADLs. Once seated in recliner on RA, O2 did not improve to >90% so patient placed back on 2L. OT notified RN.  Paient would benefit from skilled OT services to address noted impairments and functional limitations (see below for any additional details) in order to maximize safety and independence while minimizing future risk of falls, injury, and readmission. Anticipate the need for follow up OT services upon acute  hospital DC.      If plan is discharge home, recommend the following:   A little help with walking and/or transfers;A little help with bathing/dressing/bathroom     Functional Status Assessment   Patient has had a recent decline in their functional status and demonstrates the ability to make significant improvements in function in a reasonable and predictable amount of time.     Equipment Recommendations   None recommended by OT     Recommendations for Other Services         Precautions/Restrictions   Precautions Precautions: Fall Recall of Precautions/Restrictions: Intact Restrictions Weight Bearing Restrictions Per Provider Order: No     Mobility Bed Mobility Overal bed mobility: Modified Independent             General bed mobility comments: near baseline performance    Transfers Overall transfer level: Needs assistance Equipment used: Rolling walker (2 wheels) Transfers: Sit to/from Stand Sit to Stand: Mod assist           General transfer comment: required lifting A due to IV  placement in RUE      Balance Overall balance assessment: Needs assistance Sitting-balance support: Feet supported, No upper extremity supported Sitting balance-Leahy Scale: Normal     Standing balance support: Reliant on assistive device for balance, During functional activity Standing balance-Leahy Scale: Fair                             ADL either performed or assessed with clinical judgement   ADL Overall ADL's : Needs assistance/impaired  Grooming: Wash/Haye hands;Supervision/safety;Standing               Lower Body Dressing: Supervision/safety;Sit to/from stand   Toilet Transfer: Minimal Secondary School Teacher Details (indicate cue type and reason): required A to stand, didn't want to use RUEdue to IV placement Toileting- Clothing Manipulation and Hygiene: Moderate assistance;Sit to/from stand Toileting -  Clothing Manipulation Details (indicate cue type and reason): A for clothing management (underapnts up) due to IV placement in RUE, A for hygiene to ensure thoroughness     Functional mobility during ADLs: Contact guard assist;Supervision/safety;Rolling walker (2 wheels)       Vision         Perception         Praxis         Pertinent Vitals/Pain Pain Assessment Pain Assessment: No/denies pain     Extremity/Trunk Assessment Upper Extremity Assessment Upper Extremity Assessment: Generalized weakness   Lower Extremity Assessment Lower Extremity Assessment: Defer to PT evaluation       Communication Communication Communication: Impaired Factors Affecting Communication: Hearing impaired   Cognition Arousal: Alert Behavior During Therapy: WFL for tasks assessed/performed Cognition: No apparent impairments                               Following commands: Intact       Cueing  General Comments   Cueing Techniques: Verbal cues      Exercises     Shoulder Instructions      Home Living Family/patient expects to be discharged to:: Assisted living                             Home Equipment: Rollator (4 wheels);Shower seat - built in;Grab bars - toilet;Grab bars - tub/shower          Prior Functioning/Environment Prior Level of Function : Independent/Modified Independent             Mobility Comments: modI AMB in ALF c 4WW, manages her own bathing dressing with standby assist when showering; Has been at [facility] since July 2025      OT Problem List: Decreased strength;Decreased activity tolerance;Impaired UE functional use   OT Treatment/Interventions: Self-care/ADL training;Therapeutic exercise;Energy conservation;DME and/or AE instruction;Therapeutic activities;Patient/family education;Balance training      OT Goals(Current goals can be found in the care plan section)   Acute Rehab OT Goals Patient Stated Goal:  to go home OT Goal Formulation: With patient Time For Goal Achievement: 07/01/24 Potential to Achieve Goals: Good ADL Goals Pt Will Perform Grooming: with modified independence;standing Pt Will Transfer to Toilet: with modified independence;regular height toilet;grab bars;ambulating Pt Will Perform Toileting - Clothing Manipulation and hygiene: with modified independence;sit to/from stand   OT Frequency:  Min 2X/week    Co-evaluation              AM-PAC OT 6 Clicks Daily Activity     Outcome Measure Help from another person eating meals?: None Help from another person taking care of personal grooming?: A Little Help from another person toileting, which includes using toliet, bedpan, or urinal?: A Little Help from another person bathing (including washing, rinsing, drying)?: A Little Help from another person to put on and taking off regular upper body clothing?: A Little Help from another person to put on and taking off regular lower body clothing?: A Little 6 Click Score: 19   End  of Session Equipment Utilized During Treatment: Rolling walker (2 wheels);Oxygen Nurse Communication: Other (comment) (O2 requirement)  Activity Tolerance: Patient tolerated treatment well Patient left: in chair;with call bell/phone within reach;with nursing/sitter in room  OT Visit Diagnosis: Unsteadiness on feet (R26.81);Muscle weakness (generalized) (M62.81)                Time: 9189-9153 OT Time Calculation (min): 36 min Charges:  OT General Charges $OT Visit: 1 Visit OT Evaluation $OT Eval Low Complexity: 1 Low OT Treatments $Self Care/Home Management : 23-37 mins  Rogers Clause, OT/L MSOT, 06/17/2024

## 2024-06-17 NOTE — TOC Initial Note (Signed)
 Transition of Care [facility]) - Initial/Assessment Note    Patient Details  Name: Kellie Harris MRN: [id number] Date of Birth: 08-26-1930  Transition of [facility] CM/SW Contact:    Lauraine JAYSON Carpen, LCSW Phone Number: 06/17/2024, 1:56 PM  Clinical Narrative:  CSW met with patient. Daughter and Warehouse manager member at bedside. CSW introduced role and explained that therapy recommendations would be discussed. Patient lives at [facility]. Daughter is agreeable to home health. No agency preference. CSW sent referral to [facility], [facility], and [facility] as these agencies often go to [place]. Patient is currently on acute oxygen. Will follow for this potential discharge need. No further concerns. CSW will continue to follow patient and her daughter for support and facilitate return to ALF once medically stable. Daughter or ALF staff will transport at discharge.                Expected Discharge Plan: Assisted Living (with home health.) Barriers to Discharge: Continued Medical Work up   Patient Goals and CMS Choice            Expected Discharge Plan and Services     Post Acute Care Choice: Home Health Living arrangements for the past 2 months: Assisted Living Facility                                      Prior Living Arrangements/Services Living arrangements for the past 2 months: Assisted Living Facility Lives with:: Facility Resident Patient language and need for interpreter reviewed:: Yes Do you feel safe going back to the place where you live?: Yes      Need for Family Participation in Patient Care: Yes (Comment) Care giver support system in place?: Yes (comment) Current home services: DME Criminal Activity/Legal Involvement Pertinent to Current Situation/Hospitalization: No - Comment as needed  Activities of Daily Living   ADL Screening (condition at time of admission) Independently performs ADLs?: No Does the patient have a NEW difficulty with  bathing/dressing/toileting/self-feeding that is expected to last >3 days?: No Does the patient have a NEW difficulty with getting in/out of bed, walking, or climbing stairs that is expected to last >3 days?: No Does the patient have a NEW difficulty with communication that is expected to last >3 days?: No Is the patient deaf or have difficulty hearing?: No Does the patient have difficulty seeing, even when wearing glasses/contacts?: No Does the patient have difficulty concentrating, remembering, or making decisions?: No  Permission Sought/Granted Permission sought to share information with : Facility Medical Sales Representative, Family Supports    Share Information with NAME: [name] Bickle  Permission granted to share info w AGENCY: [facility]  Permission granted to share info w Relationship: Daughter  Permission granted to share info w Contact Information: [phone]  Emotional Assessment Appearance:: Appears stated age Attitude/Demeanor/Rapport: Engaged Affect (typically observed): Accepting, Appropriate, Calm, Pleasant Orientation: : Oriented to Self, Oriented to Place, Oriented to  Time, Oriented to Situation Alcohol / Substance Use: Not Applicable Psych Involvement: No (comment)  Admission diagnosis:  CHF (congestive heart failure) (HCC) [I50.9] SOB (shortness of breath) [R06.02] Acute on chronic congestive heart failure, unspecified heart failure type [facility]) [I50.9] Patient Active Problem List   Diagnosis Date Noted   Hypomagnesemia 06/17/2024   Diarrhea 06/17/2024   Hypoxia 06/17/2024   Lobar pneumonia 06/17/2024   Acute on chronic diastolic CHF (congestive heart failure) (HCC) 06/16/2024   Expressive aphasia 02/18/2024  Altered mental status 02/18/2024   Hyponatremia 09/08/2021   Acute blood loss anemia 09/08/2021   Hypokalemia 09/07/2021   History of total hip replacement, left 09/04/2021   Dizziness 01/28/2021   Aphasia 05/03/2020   S/P ORIF (open reduction internal  fixation) fracture radius and ulnar fx 04/08/18 04/10/2018   Distal radius and ulna fracture, right    History of CVA (cerebrovascular accident)    Tachycardia    Leukocytosis    Seizures (HCC)    Stroke-like episode (HCC) s/p IV tPA 04/03/2018   Syncope    TIA (transient ischemic attack) 11/04/2017   Malnutrition of moderate degree 08/26/2017   Total knee replacement status 08/07/2017   Closed left hip fracture (HCC) 02/12/2017   Essential hypertension 02/12/2017   HLD (hyperlipidemia) 02/12/2017   PCP:  [name], MD Pharmacy:   [facility] - [place], [place] - [address] [place] [address] Phone: [phone] Fax: [phone]     Social Drivers of Health (SDOH) Social History: SDOH Screenings   Food Insecurity: No Food Insecurity (06/16/2024)  Housing: Low Risk  (06/16/2024)  Transportation Needs: No Transportation Needs (06/16/2024)  Utilities: Not At Risk (06/16/2024)  Financial Resource Strain: Low Risk  (07/17/2023)   Received from [facility]  Physical Activity: Unknown (11/04/2017)  Social Connections: Socially Isolated (06/16/2024)  Stress: No Stress Concern Present (11/04/2017)  Tobacco Use: Low Risk  (06/16/2024)   SDOH Interventions:     Readmission Risk Interventions     No data to display

## 2024-06-17 NOTE — Assessment & Plan Note (Signed)
 Replaced

## 2024-06-17 NOTE — Assessment & Plan Note (Addendum)
 Continue increased dose of coreg and cozaar .

## 2024-06-17 NOTE — Assessment & Plan Note (Addendum)
 Replaced

## 2024-06-17 NOTE — Progress Notes (Signed)
 Physical Therapy Treatment Patient Details Name: Kellie Harris MRN: [id number] DOB: April 07, 1931 Today's Date: 06/17/2024   History of Present Illness Semiah Port is a 89yoF who comes to [facility] on 06/16/24 with sudden acute onset dyspnea while sleeping, also having acute LEE. Pt reside at [facility] x3 months now. PMH:HTN, chronic HFpEF, OA,, HTN, HLD, IBS. HOH, recurrent TIA v seizure like activity followed by neurology. At baseline pt AMB household distances with 4WW, no O2, no recent falls or close calls.    PT Comments  Patient seen for PT session focused on functional mobility and transfers. Patient required minA for transfers and used RW for ambulation of 125 feet. No supplemental O2 was used during ambulation when returned to room O2 reading of  88%. Pt. Returned on O2. Tolerated session well with no signs of exertion Vitals remained stable during activity. Main limiting factors today were constant urge to have bowel movement. Interventions aimed at improving functional mobility. Patient shows great potential to make progress with continued acute level rehab. Continued skilled PT recommended to progress toward functional goals and support discharge readiness. Pt making good progress toward goals, will continue to follow POC. Discharge recommendation remains appropriate     If plan is discharge home, recommend the following: A little help with walking and/or transfers;Assist for transportation;Help with stairs or ramp for entrance;A little help with bathing/dressing/bathroom   Can travel by private vehicle        Equipment Recommendations  None recommended by PT    Recommendations for Other Services       Precautions / Restrictions Precautions Precautions: Fall Recall of Precautions/Restrictions: Intact Restrictions Weight Bearing Restrictions Per Provider Order: No     Mobility  Bed Mobility                    Transfers Overall transfer level: Needs assistance Equipment  used: Rolling walker (2 wheels) Transfers: Sit to/from Stand Sit to Stand: Min assist                Ambulation/Gait Ambulation/Gait assistance: Supervision Gait Distance (Feet): 125 Feet Assistive device: Rolling walker (2 wheels) Gait Pattern/deviations: WFL(Within Functional Limits), Step-to pattern       General Gait Details: takes some steps forward and backward at bedside with RW, no LOB, trial on room air unsuccessful with desaturation to 84%   Stairs             Wheelchair Mobility     Tilt Bed    Modified Rankin (Stroke Patients Only)       Balance Overall balance assessment: Needs assistance Sitting-balance support: Feet supported, No upper extremity supported Sitting balance-Leahy Scale: Normal     Standing balance support: Reliant on assistive device for balance, During functional activity Standing balance-Leahy Scale: Fair                              Hotel Manager: Impaired Factors Affecting Communication: Hearing impaired  Cognition Arousal: Alert Behavior During Therapy: WFL for tasks assessed/performed                             Following commands: Intact      Cueing Cueing Techniques: Verbal cues  Exercises      General Comments        Pertinent Vitals/Pain Pain Assessment Pain Assessment: No/denies pain    Home Living Family/patient expects  to be discharged to:: Assisted living                 Home Equipment: Rollator (4 wheels);Shower seat - built in;Grab bars - toilet;Grab bars - tub/shower      Prior Function            PT Goals (current goals can now be found in the care plan section) Acute Rehab PT Goals Patient Stated Goal: achieve rest, regain vigor, DC from hospital PT Goal Formulation: With patient Time For Goal Achievement: 06/30/24 Potential to Achieve Goals: Good Progress towards PT goals: Progressing toward goals    Frequency    Min  2X/week      PT Plan      Co-evaluation              AM-PAC PT 6 Clicks Mobility   Outcome Measure  Help needed turning from your back to your side while in a flat bed without using bedrails?: None Help needed moving from lying on your back to sitting on the side of a flat bed without using bedrails?: None Help needed moving to and from a bed to a chair (including a wheelchair)?: A Little Help needed standing up from a chair using your arms (e.g., wheelchair or bedside chair)?: A Little Help needed to walk in hospital room?: A Little Help needed climbing 3-5 steps with a railing? : A Little 6 Click Score: 20    End of Session Equipment Utilized During Treatment: Oxygen Activity Tolerance: Patient tolerated treatment well;No increased pain;Patient limited by fatigue Patient left: in bed;with family/visitor present;with call bell/phone within reach Nurse Communication: Mobility status PT Visit Diagnosis: Difficulty in walking, not elsewhere classified (R26.2);Other abnormalities of gait and mobility (R26.89);Muscle weakness (generalized) (M62.81)     Time: 8872-8854 PT Time Calculation (min) (ACUTE ONLY): 18 min  Charges:    $Therapeutic Activity: 8-22 mins PT General Charges $$ ACUTE PT VISIT: 1 Visit                     Sherlean Lesches DPT, PT     Sherlean A Erin Obando 06/17/2024, 11:53 AM

## 2024-06-17 NOTE — Assessment & Plan Note (Addendum)
 Ruled out.  More likely CHF.  Since patient has C. difficile colitis.

## 2024-06-18 ENCOUNTER — Inpatient Hospital Stay

## 2024-06-18 ENCOUNTER — Telehealth (HOSPITAL_COMMUNITY): Payer: Self-pay

## 2024-06-18 ENCOUNTER — Other Ambulatory Visit (HOSPITAL_COMMUNITY): Payer: Self-pay

## 2024-06-18 DIAGNOSIS — A0472 Enterocolitis due to Clostridium difficile, not specified as recurrent: Secondary | ICD-10-CM

## 2024-06-18 DIAGNOSIS — M25562 Pain in left knee: Secondary | ICD-10-CM

## 2024-06-18 DIAGNOSIS — I5033 Acute on chronic diastolic (congestive) heart failure: Secondary | ICD-10-CM | POA: Diagnosis not present

## 2024-06-18 DIAGNOSIS — E876 Hypokalemia: Secondary | ICD-10-CM | POA: Diagnosis not present

## 2024-06-18 LAB — BASIC METABOLIC PANEL WITH GFR
Anion gap: 8 (ref 5–15)
BUN: 12 mg/dL (ref 8–23)
CO2: 24 mmol/L (ref 22–32)
Calcium: 7.6 mg/dL — ABNORMAL LOW (ref 8.9–10.3)
Chloride: 105 mmol/L (ref 98–111)
Creatinine, Ser: 0.92 mg/dL (ref 0.44–1.00)
GFR, Estimated: 59 mL/min — ABNORMAL LOW (ref >60)
Glucose, Bld: 83 mg/dL (ref 70–99)
Potassium: 3.9 mmol/L (ref 3.5–5.1)
Sodium: 137 mmol/L (ref 135–145)

## 2024-06-18 LAB — PHOSPHORUS: Phosphorus: 2.1 mg/dL — ABNORMAL LOW (ref 2.5–4.6)

## 2024-06-18 LAB — MAGNESIUM: Magnesium: 2.3 mg/dL (ref 1.7–2.4)

## 2024-06-18 MED ORDER — K PHOS MONO-SOD PHOS DI & MONO 155-852-130 MG PO TABS
500.0000 mg | ORAL_TABLET | ORAL | Status: AC
  Administered 2024-06-18 (×2): 500 mg via ORAL
  Filled 2024-06-18 (×2): qty 2

## 2024-06-18 MED ORDER — POTASSIUM CHLORIDE 20 MEQ PO PACK
20.0000 meq | PACK | Freq: Two times a day (BID) | ORAL | Status: DC
  Administered 2024-06-18 – 2024-06-20 (×5): 20 meq via ORAL
  Filled 2024-06-18 (×5): qty 1

## 2024-06-18 MED ORDER — FUROSEMIDE 10 MG/ML IJ SOLN
20.0000 mg | Freq: Two times a day (BID) | INTRAMUSCULAR | Status: DC
  Administered 2024-06-18 – 2024-06-19 (×2): 20 mg via INTRAVENOUS
  Filled 2024-06-18 (×2): qty 2

## 2024-06-18 MED ORDER — INDOMETHACIN 25 MG PO CAPS
25.0000 mg | ORAL_CAPSULE | Freq: Two times a day (BID) | ORAL | Status: DC
  Administered 2024-06-18 – 2024-06-19 (×2): 25 mg via ORAL
  Filled 2024-06-18 (×3): qty 1

## 2024-06-18 NOTE — Progress Notes (Signed)
 Physical Therapy Treatment Patient Details Name: Kellie Harris MRN: [id number] DOB: [birth year] Today's Date: 06/18/2024   History of Present Illness Kellie Harris is a 89yoF who comes to [facility] on 06/16/24 with sudden acute onset dyspnea while sleeping, also having acute LEE. Pt reside at [facility] x3 months now. PMH:HTN, chronic HFpEF, OA,, HTN, HLD, IBS. HOH, recurrent TIA v seizure like activity followed by neurology. At baseline pt AMB household distances with 4WW, no O2, no recent falls or close calls.    PT Comments  Patient progressing towards physical therapy goals, however continues to require O2 for activity as patient desaturates to 83% with activity on RA. Overall, patient functioning at [facility] level with use of RW. Cues for pursed lip breathing during mobility with slow increase back to 88-90%. Discharge plan remains appropriate.     If plan is discharge home, recommend the following: A little help with walking and/or transfers;Assist for transportation;Help with stairs or ramp for entrance;A little help with bathing/dressing/bathroom   Can travel by private vehicle        Equipment Recommendations  None recommended by PT    Recommendations for Other Services       Precautions / Restrictions Precautions Precautions: Fall Recall of Precautions/Restrictions: Intact Restrictions Weight Bearing Restrictions Per Provider Order: No     Mobility  Bed Mobility               General bed mobility comments: seated EOB on arrival    Transfers Overall transfer level: Needs assistance Equipment used: Rolling Mozelle Remlinger (2 wheels) Transfers: Sit to/from Stand Sit to Stand: Contact guard assist                Ambulation/Gait Ambulation/Gait assistance: Contact guard assist, Supervision Gait Distance (Feet): 150 Feet Assistive device: Rolling Odalis Jordan (2 wheels) Gait Pattern/deviations: Step-through pattern Gait velocity: decreased     General Gait Details: Ambulated on RA  with spO2 dropping to 83% halfway through. Cues for pursed lip breathing with slow increase to 88%. Again dropped to 83% and returned to 90% after seated rest break   Stairs             Wheelchair Mobility     Tilt Bed    Modified Rankin (Stroke Patients Only)       Balance Overall balance assessment: Needs assistance Sitting-balance support: Feet supported, No upper extremity supported Sitting balance-Leahy Scale: Normal     Standing balance support: Reliant on assistive device for balance, During functional activity Standing balance-Leahy Scale: Fair                              Hotel Manager: Impaired Factors Affecting Communication: Hearing impaired  Cognition Arousal: Alert Behavior During Therapy: WFL for tasks assessed/performed   PT - Cognitive impairments: No apparent impairments                         Following commands: Intact      Cueing    Exercises      General Comments        Pertinent Vitals/Pain Pain Assessment Pain Assessment: No/denies pain    Home Living                          Prior Function            PT Goals (current goals can now be  found in the care plan section) Acute Rehab PT Goals PT Goal Formulation: With patient Time For Goal Achievement: 06/30/24 Potential to Achieve Goals: Good Progress towards PT goals: Progressing toward goals    Frequency    Min 2X/week      PT Plan      Co-evaluation              AM-PAC PT 6 Clicks Mobility   Outcome Measure  Help needed turning from your back to your side while in a flat bed without using bedrails?: None Help needed moving from lying on your back to sitting on the side of a flat bed without using bedrails?: None Help needed moving to and from a bed to a chair (including a wheelchair)?: A Little Help needed standing up from a chair using your arms (e.g., wheelchair or bedside chair)?: A  Little Help needed to walk in hospital room?: A Little Help needed climbing 3-5 steps with a railing? : A Little 6 Click Score: 20    End of Session Equipment Utilized During Treatment: Oxygen Activity Tolerance: Patient tolerated treatment well;No increased pain;Patient limited by fatigue Patient left: in bed;with call bell/phone within reach;with bed alarm set Nurse Communication: Mobility status PT Visit Diagnosis: Difficulty in walking, not elsewhere classified (R26.2);Other abnormalities of gait and mobility (R26.89);Muscle weakness (generalized) (M62.81)     Time: 9043-8980 PT Time Calculation (min) (ACUTE ONLY): 23 min  Charges:    $Therapeutic Activity: 23-37 mins PT General Charges $$ ACUTE PT VISIT: 1 Visit                     Maryanne Finder, PT, DPT Physical Therapist - [facility]  [facility]    Rajeev Escue A Kaisa Wofford 06/18/2024, 3:02 PM

## 2024-06-18 NOTE — Assessment & Plan Note (Addendum)
 Started on p.o. vancomycin  on 06/17/2024.  Will give another 9 days upon discharge.

## 2024-06-18 NOTE — Progress Notes (Signed)
 Occupational Therapy Treatment Patient Details Name: Kellie Harris MRN: [id number] DOB: 1931-06-11 Today's Date: 06/18/2024   History of present illness Kellie Harris is a 89yoF who comes to [facility] on 06/16/24 with sudden acute onset dyspnea while sleeping, also having acute LEE. Pt reside at [facility] x3 months now. PMH:HTN, chronic HFpEF, OA,, HTN, HLD, IBS. HOH, recurrent TIA v seizure like activity followed by neurology. At baseline pt AMB household distances with 4WW, no O2, no recent falls or close calls.   OT comments  Patient seen for OT treatment on this date. Upon arrival to room patient resting in bed, on RA, O2 84-87%, OT placed patient back on 2L. Patient performed bed mobility without A, sit<>stand with r/w with CGA and cues for body mechanics, stood at sink for 8 minutes while engaging in grooming tasks with set up A/supervision; O2 monitored throughout and remained above 90%. Patient reporting pain in L knee, up to 8/10 and states she thinks its OA and mostly from positioning in bed, OT notified MD/RN.  Patient ended treatment in recliner with chair alarm on and all needs within reach. Patient demonstrating improvement in standing activity tolerance and functional independence. Patient making good progress toward goals, will continue to follow POC. Discharge recommendation remains appropriate.        If plan is discharge home, recommend the following:  A little help with walking and/or transfers;A little help with bathing/dressing/bathroom   Equipment Recommendations  None recommended by OT    Recommendations for Other Services      Precautions / Restrictions Precautions Precautions: Fall Recall of Precautions/Restrictions: Intact Restrictions Weight Bearing Restrictions Per Provider Order: No       Mobility Bed Mobility Overal bed mobility: Modified Independent             General bed mobility comments: seated EOB on arrival    Transfers Overall transfer level:  Needs assistance Equipment used: Rolling walker (2 wheels) Transfers: Sit to/from Stand Sit to Stand: Contact guard assist                 Balance Overall balance assessment: Needs assistance Sitting-balance support: Feet supported, No upper extremity supported Sitting balance-Leahy Scale: Normal     Standing balance support: No upper extremity supported Standing balance-Leahy Scale: Fair                             ADL either performed or assessed with clinical judgement   ADL       Grooming: Wash/[place] hands;Wash/[place] face;Oral care;Supervision/safety;Standing Grooming Details (indicate cue type and reason): able to stand at sink with r/w                                    Extremity/Trunk Assessment              Vision       Perception     Praxis     Communication Communication Communication: Impaired Factors Affecting Communication: Hearing impaired   Cognition Arousal: Alert Behavior During Therapy: WFL for tasks assessed/performed Cognition: No apparent impairments                               Following commands: Intact        Cueing   Cueing Techniques: Verbal cues  Exercises  Shoulder Instructions       General Comments      Pertinent Vitals/ Pain       Pain Assessment Pain Assessment: 0-10 Pain Score: 8  Pain Location: L knee Pain Descriptors / Indicators: Aching, Dull, Discomfort, Grimacing, Guarding Pain Intervention(s): Monitored during session, Other (comment) (notified MD and RN)  Home Living                                          Prior Functioning/Environment              Frequency  Min 2X/week        Progress Toward Goals  OT Goals(current goals can now be found in the care plan section)     Acute Rehab OT Goals Patient Stated Goal: to go home OT Goal Formulation: With patient Time For Goal Achievement: 07/01/24 Potential to Achieve Goals:  Good  Plan      Co-evaluation                 AM-PAC OT 6 Clicks Daily Activity     Outcome Measure   Help from another person eating meals?: None Help from another person taking care of personal grooming?: A Little Help from another person toileting, which includes using toliet, bedpan, or urinal?: A Little Help from another person bathing (including washing, rinsing, drying)?: A Little Help from another person to put on and taking off regular upper body clothing?: A Little Help from another person to put on and taking off regular lower body clothing?: A Little 6 Click Score: 19    End of Session Equipment Utilized During Treatment: Rolling walker (2 wheels);Oxygen  OT Visit Diagnosis: Unsteadiness on feet (R26.81);Muscle weakness (generalized) (M62.81)   Activity Tolerance Patient tolerated treatment well   Patient Left in chair;with call bell/phone within reach;with chair alarm set   Nurse Communication Other (comment) (O2 requirement)        Time: 8542-8472 OT Time Calculation (min): 30 min  Charges: OT General Charges $OT Visit: 1 Visit OT Treatments $Self Care/Home Management : 23-37 mins  Rogers Clause, OT/L MSOT, 06/18/2024

## 2024-06-18 NOTE — TOC Progression Note (Signed)
 Transition of Care [facility]) - Progression Note    Patient Details  Name: Kellie Harris MRN: [id number] Date of Birth: [birth year]  Transition of [facility]) CM/SW Contact  Lauraine JAYSON Carpen, LCSW Phone Number: 06/18/2024, 3:49 PM  Clinical Narrative:  [facility] can accept patient. Called daughter. No preference. Accepted [facility]. Liaison is aware.  Expected Discharge Plan: Assisted Living (with home health.) Barriers to Discharge: Continued Medical Work up               Expected Discharge Plan and Services     Post Acute Care Choice: Home Health Living arrangements for the past 2 months: Assisted Living Facility                                       Social Drivers of Health (SDOH) Interventions SDOH Screenings   Food Insecurity: No Food Insecurity (06/16/2024)  Housing: Low Risk  (06/16/2024)  Transportation Needs: No Transportation Needs (06/16/2024)  Utilities: Not At Risk (06/16/2024)  Financial Resource Strain: Low Risk  (07/17/2023)   Received from [facility]  Physical Activity: Unknown (11/04/2017)  Social Connections: Socially Isolated (06/16/2024)  Stress: No Stress Concern Present (11/04/2017)  Tobacco Use: Low Risk  (06/16/2024)    Readmission Risk Interventions     No data to display

## 2024-06-18 NOTE — Progress Notes (Signed)
 Progress Note   Patient: Kellie Harris FMW:[id number] DOB: 09-May-1931 DOA: 06/16/2024     1 DOS: the patient was seen and examined on 06/18/2024   Brief hospital course: 89 y.o. female with medical history significant of HTN, chronic HFpEF, multiple OA's, HTN, HLD, IBS presented with worsening of shortness of breath and leg swelling.   Symptoms started about 1 week ago, patient started to notice increasing ankle edema as well as Lebron cough and increasing exertional dyspnea.  She denied any chest pain, no fever or chills.  Last night, she could not lay flat on the bed because of increasing shortness of breath and decided to come to ED. patient also reported that last 7 days she has had a flareup of her IBS with diarrhea watery problems 1-2 times a day but denied any abdominal pain, she has tried Imodium  with some improvement.   ED Course: Afebrile, tachycardia blood pressure slightly elevated 160/80 O2 saturation 100% on room air.  CTA negative for PE but mild pulmonary edema, blood work showed K3.0 bicarb 25 BUN 16 creatinine 1.0, WBC 8.9 hemoglobin 11.8, COVID-negative.   Patient was given IV Lasix 20 mg x 1 in the ED.  11/5.  Patient's daughter stated that diarrhea has been worse and foul-smelling.  Will send off stool studies.  Started on Zithromax and Rocephin for patchy infiltrate on chest x-ray.  Replacing magnesium  and potassium.  Continue Lasix. 11/6.  C. difficile diagnosed yesterday and started on vancomycin .  Discontinue antibiotics, less likely pneumonia.  Assessment and Plan: * C. difficile colitis Started on p.o. vancomycin  on 06/17/2024.  Would like to see diarrhea to slow down and start to form up prior to disposition.  Acute on chronic diastolic CHF (congestive heart failure) (HCC) Continue IV Lasix.  TED hose.  Patient on low-dose Coreg and losartan .  Hypokalemia Now in a better range.  Continue replacement while on Lasix.  Hypomagnesemia Replaced  Hypophosphatemia Replace  orally  Lobar pneumonia Less likely bacterial pneumonia.  More likely CHF.  Since patient has C. difficile colitis, I will stop antibiotics.  Hypoxia Pulse ox dropped down into the high 80s with ambulation yesterday.  Will check on a daily basis.  Essential hypertension Blood pressure better controlled        Subjective: Patient feeling better this morning.  I took her off oxygen to see if pulse ox will hold on room air.  Patient diagnosed with C. difficile colitis yesterday and started on vancomycin .  Initially came in with shortness of breath.  Physical Exam: Vitals:   06/17/24 2352 06/18/24 0308 06/18/24 0520 06/18/24 0912  BP: (!) 145/71 (!) 143/81  (!) 160/90  Pulse: 75 71  83  Resp: 20 20  18   Temp: 98.3 F (36.8 C) 98.2 F (36.8 C)  97.7 F (36.5 C)  TempSrc:      SpO2: 94% 97%  99%  Weight:   45.6 kg   Height:       Physical Exam HENT:     Head: Normocephalic.  Eyes:     General: Lids are normal.     Conjunctiva/sclera: Conjunctivae normal.  Cardiovascular:     Rate and Rhythm: Normal rate and regular rhythm.     Heart sounds: Normal heart sounds, S1 normal and S2 normal.  Pulmonary:     Breath sounds: Examination of the right-lower field reveals decreased breath sounds. Examination of the left-lower field reveals decreased breath sounds. Decreased breath sounds present. No wheezing, rhonchi or rales.  Abdominal:     Palpations: Abdomen is soft.     Tenderness: There is no abdominal tenderness.  Musculoskeletal:     Right lower leg: Swelling present.     Left lower leg: Swelling present.  Skin:    General: Skin is warm.  Neurological:     Mental Status: She is alert and oriented to person, place, and time.     Data Reviewed: C. difficile positive. Creatinine 0.92, phosphorus 2.1, potassium 3.9, magnesium  2.3  Family Communication: Updated patient's daughter on the phone  Disposition: Status is: Inpatient Remains inpatient appropriate because:  Would like to see diarrhea slowed down and start to form up prior to disposition.  Treating C. difficile colitis.  Trying to see if we can get off oxygen.  Planned Discharge Destination: Home with Home Health    Time spent: 28 minutes  Author: [name], MD 06/18/2024 12:36 PM  For on call review [email or web address].

## 2024-06-18 NOTE — Assessment & Plan Note (Signed)
 Replaced

## 2024-06-18 NOTE — Progress Notes (Addendum)
 Notified by Occupational Therapy patient having knee pain in the left knee.  Patient did not tell me about this this morning.  Patient does have a history of arthritis.  Physical exam.  Left knee able to bend and extend with discomfort.  Some point tenderness around the knee.    Plan.  Will start off with an x-ray.  Will give Tylenol .  Will give a dose of indomethacin this evening.  Will add on a uric acid.  Potential could be gout secondary to giving IV Lasix.  Need to give IV Lasix to see if we can get off the oxygen.  Will also give heating pad.  (Do not want to give colchicine with C. difficile colitis and would rather not give steroid because of her age)  Dr. [name] 18 minutes

## 2024-06-18 NOTE — Plan of Care (Signed)

## 2024-06-18 NOTE — Telephone Encounter (Signed)
 Pharmacy Patient Advocate Encounter  Insurance verification completed.    The patient is insured through [employer]/ [employer]. Patient has Medicare and is not eligible for a copay card, but may be able to apply for patient assistance or Medicare RX Payment Plan (Patient Must reach out to their plan, if eligible for payment plan), if available.    Ran test claim for Vancomycin  125mg  and the current 30 day co-pay is $92.47.  Ran test claim for Dificid 200mg  and the current 30 day co-pay is $1,224.79.   This test claim was processed through [facility]- copay amounts may vary at other pharmacies due to pharmacy/plan contracts, or as the patient moves through the different stages of their insurance plan.

## 2024-06-19 DIAGNOSIS — M25562 Pain in left knee: Secondary | ICD-10-CM

## 2024-06-19 DIAGNOSIS — I5033 Acute on chronic diastolic (congestive) heart failure: Secondary | ICD-10-CM | POA: Diagnosis not present

## 2024-06-19 DIAGNOSIS — A0472 Enterocolitis due to Clostridium difficile, not specified as recurrent: Secondary | ICD-10-CM | POA: Diagnosis not present

## 2024-06-19 DIAGNOSIS — E876 Hypokalemia: Secondary | ICD-10-CM | POA: Diagnosis not present

## 2024-06-19 LAB — BASIC METABOLIC PANEL WITH GFR
Anion gap: 10 (ref 5–15)
BUN: 15 mg/dL (ref 8–23)
CO2: 26 mmol/L (ref 22–32)
Calcium: 7.4 mg/dL — ABNORMAL LOW (ref 8.9–10.3)
Chloride: 102 mmol/L (ref 98–111)
Creatinine, Ser: 0.85 mg/dL (ref 0.44–1.00)
GFR, Estimated: >60 mL/min
Glucose, Bld: 76 mg/dL (ref 70–99)
Potassium: 3.9 mmol/L (ref 3.5–5.1)
Sodium: 138 mmol/L (ref 135–145)

## 2024-06-19 LAB — URIC ACID: Uric Acid, Serum: 5.4 mg/dL (ref 2.5–7.1)

## 2024-06-19 LAB — PHOSPHORUS: Phosphorus: 3.5 mg/dL (ref 2.5–4.6)

## 2024-06-19 MED ORDER — FUROSEMIDE 10 MG/ML IJ SOLN
40.0000 mg | Freq: Two times a day (BID) | INTRAMUSCULAR | Status: DC
  Administered 2024-06-19 – 2024-06-20 (×2): 40 mg via INTRAVENOUS
  Filled 2024-06-19 (×2): qty 4

## 2024-06-19 NOTE — Plan of Care (Signed)

## 2024-06-19 NOTE — Progress Notes (Signed)
 Progress Note   Patient: Kellie Harris FMW:[id number] DOB: 04-26-1931 DOA: 06/16/2024     2 DOS: the patient was seen and examined on 06/19/2024   Brief hospital course: 89 y.o. female with medical history significant of HTN, chronic HFpEF, multiple OA's, HTN, HLD, IBS presented with worsening of shortness of breath and leg swelling.   Symptoms started about 1 week ago, patient started to notice increasing ankle edema as well as Gierke cough and increasing exertional dyspnea.  She denied any chest pain, no fever or chills.  Last night, she could not lay flat on the bed because of increasing shortness of breath and decided to come to ED. patient also reported that last 7 days she has had a flareup of her IBS with diarrhea watery problems 1-2 times a day but denied any abdominal pain, she has tried Imodium  with some improvement.   ED Course: Afebrile, tachycardia blood pressure slightly elevated 160/80 O2 saturation 100% on room air.  CTA negative for PE but mild pulmonary edema, blood work showed K3.0 bicarb 25 BUN 16 creatinine 1.0, WBC 8.9 hemoglobin 11.8, COVID-negative.   Patient was given IV Lasix 20 mg x 1 in the ED.  11/5.  Patient's daughter stated that diarrhea has been worse and foul-smelling.  Will send off stool studies.  Started on Zithromax and Rocephin for patchy infiltrate on chest x-ray.  Replacing magnesium  and potassium.  Continue Lasix. 11/6.  C. difficile diagnosed yesterday and started on vancomycin .  Discontinue antibiotics, less likely pneumonia.  Assessment and Plan: * C. difficile colitis Started on p.o. vancomycin  on 06/17/2024.  Had 3 stage VI bowel movements yesterday and 1 earlier this morning at 5 AM.  Acute on chronic diastolic CHF (congestive heart failure) (HCC) Continue IV Lasix and increase to 40 mg twice daily.  TED hose.  Patient on low-dose Coreg and losartan .  Hypokalemia Now in a better range.  Continue replacement while on  Lasix.  Hypomagnesemia Replaced  Acute pain of left knee Improved today.  Tylenol  given yesterday and 2 doses of indomethacin given  Hypophosphatemia Replaced  Lobar pneumonia Ruled out.  More likely CHF.  Since patient has C. difficile colitis, I will stop antibiotics.  Hypoxia Pulse ox dropped down into the high 80s with ambulation yesterday.  Patient was able to hold her saturations with ambulation today with nursing staff  Essential hypertension Blood pressure better controlled        Subjective: Patient feeling a little bit better.  Had 3 bowel movements yesterday stage VI and one this morning that 5 AM.  Treating C. difficile colitis.  Left knee pain is better today.  Still having swelling.  Able to come off oxygen today.  Physical Exam: Vitals:   06/19/24 0432 06/19/24 0500 06/19/24 1013 06/19/24 1204  BP: (!) 142/67  135/84 (!) 163/71  Pulse: 71  81 84  Resp: 16     Temp: 98.2 F (36.8 C)  98.1 F (36.7 C) 98.4 F (36.9 C)  TempSrc: Oral  Oral Oral  SpO2: 95%  98% 97%  Weight:  42.9 kg    Height:       Physical Exam HENT:     Head: Normocephalic.  Eyes:     General: Lids are normal.     Conjunctiva/sclera: Conjunctivae normal.  Cardiovascular:     Rate and Rhythm: Normal rate and regular rhythm.     Heart sounds: Normal heart sounds, S1 normal and S2 normal.  Pulmonary:  Breath sounds: Examination of the right-lower field reveals decreased breath sounds. Examination of the left-lower field reveals decreased breath sounds. Decreased breath sounds present. No wheezing, rhonchi or rales.  Abdominal:     Palpations: Abdomen is soft.     Tenderness: There is no abdominal tenderness.  Musculoskeletal:     Right lower leg: Swelling present.     Left lower leg: Swelling present.     Comments: Better range of motion of left knee today.  Skin:    General: Skin is warm.  Neurological:     Mental Status: She is alert and oriented to person, place, and  time.     Data Reviewed: Creatinine 0.85, phosphorus 3.5, potassium 3.9, Uric acid 5.4  Family Communication: Spoke with daughter on the phone  Disposition: Status is: Inpatient Remains inpatient appropriate because: Patient still has increased swelling bilateral lower extremities will increase Lasix to 40 mg IV twice daily since left knee pain is better.  Since patient is still having diarrhea we will continue to monitor stools today.  Continue C. difficile treatment.  TOC to check whether assisted living will take over the weekend.  Planned Discharge Destination: Assisted living    Time spent: 28 minutes  Author: [name], MD 06/19/2024 1:16 PM  For on call review [email or web address].

## 2024-06-19 NOTE — TOC Progression Note (Addendum)
 Transition of Care [facility]) - Progression Note    Patient Details  Name: Kellie Harris MRN: [id number] Date of Birth: [birth year]  [facility] CM/SW Contact  Lauraine JAYSON Carpen, LCSW Phone Number: 06/19/2024, 10:34 AM  Clinical Narrative:   CSW called [facility] RN. She is checking their policy regarding patients on isolations precautions.   4:07 pm: [facility] confirmed patient can return over the weekend. MD is aware.  Expected Discharge Plan: Assisted Living (with home health.) Barriers to Discharge: Continued Medical Work up               Expected Discharge Plan and Services     Post Acute Care Choice: Home Health Living arrangements for the past 2 months: Assisted Living Facility                                       Social Drivers of Health (SDOH) Interventions SDOH Screenings   Food Insecurity: No Food Insecurity (06/16/2024)  Housing: Low Risk  (06/16/2024)  Transportation Needs: No Transportation Needs (06/16/2024)  Utilities: Not At Risk (06/16/2024)  Financial Resource Strain: Low Risk  (07/17/2023)   Received from [facility]  Physical Activity: Unknown (11/04/2017)  Social Connections: Socially Isolated (06/16/2024)  Stress: No Stress Concern Present (11/04/2017)  Tobacco Use: Low Risk  (06/16/2024)    Readmission Risk Interventions     No data to display

## 2024-06-19 NOTE — Progress Notes (Signed)
 Patient Saturations on Room Air at Rest = 91%  Patient Saturations on Room Air while Ambulating = 94%  Patient did not require any oxygen while ambulating.

## 2024-06-19 NOTE — Assessment & Plan Note (Addendum)
 Improved. As needed Tylenol .

## 2024-06-20 DIAGNOSIS — N182 Chronic kidney disease, stage 2 (mild): Secondary | ICD-10-CM | POA: Insufficient documentation

## 2024-06-20 DIAGNOSIS — A0472 Enterocolitis due to Clostridium difficile, not specified as recurrent: Secondary | ICD-10-CM | POA: Diagnosis not present

## 2024-06-20 DIAGNOSIS — I5033 Acute on chronic diastolic (congestive) heart failure: Secondary | ICD-10-CM | POA: Diagnosis not present

## 2024-06-20 DIAGNOSIS — E876 Hypokalemia: Secondary | ICD-10-CM | POA: Diagnosis not present

## 2024-06-20 LAB — BASIC METABOLIC PANEL WITH GFR
Anion gap: 14 (ref 5–15)
BUN: 14 mg/dL (ref 8–23)
CO2: 26 mmol/L (ref 22–32)
Calcium: 8.8 mg/dL — ABNORMAL LOW (ref 8.9–10.3)
Chloride: 95 mmol/L — ABNORMAL LOW (ref 98–111)
Creatinine, Ser: 1.02 mg/dL — ABNORMAL HIGH (ref 0.44–1.00)
GFR, Estimated: 52 mL/min — ABNORMAL LOW (ref >60)
Glucose, Bld: 97 mg/dL (ref 70–99)
Potassium: 4 mmol/L (ref 3.5–5.1)
Sodium: 135 mmol/L (ref 135–145)

## 2024-06-20 MED ORDER — CHOLESTYRAMINE 4 G PO PACK
4.0000 g | PACK | Freq: Two times a day (BID) | ORAL | Status: DC
  Administered 2024-06-20 (×2): 4 g via ORAL
  Filled 2024-06-20 (×3): qty 1

## 2024-06-20 NOTE — Assessment & Plan Note (Signed)
 Borderline CKD stage IIIa.  Creatinine varies with hydration status.

## 2024-06-20 NOTE — Progress Notes (Signed)
 Progress Note   Patient: Kellie Harris FMW:[id number] DOB: [birth year] DOA: 06/16/2024     3 DOS: the patient was seen and examined on 06/20/2024   Brief hospital course: 89 y.o. female with medical history significant of HTN, chronic HFpEF, multiple OA's, HTN, HLD, IBS presented with worsening of shortness of breath and leg swelling.   Symptoms started about 1 week ago, patient started to notice increasing ankle edema as well as Tensley cough and increasing exertional dyspnea.  She denied any chest pain, no fever or chills.  Last night, she could not lay flat on the bed because of increasing shortness of breath and decided to come to ED. patient also reported that last 7 days she has had a flareup of her IBS with diarrhea watery problems 1-2 times a day but denied any abdominal pain, she has tried Imodium  with some improvement.   ED Course: Afebrile, tachycardia blood pressure slightly elevated 160/80 O2 saturation 100% on room air.  CTA negative for PE but mild pulmonary edema, blood work showed K3.0 bicarb 25 BUN 16 creatinine 1.0, WBC 8.9 hemoglobin 11.8, COVID-negative.   Patient was given IV Lasix 20 mg x 1 in the ED.  11/5.  Patient's daughter stated that diarrhea has been worse and foul-smelling.  Will send off stool studies.  Started on Zithromax and Rocephin for patchy infiltrate on chest x-ray.  Replacing magnesium  and potassium.  Continue Lasix. 11/6.  C. difficile diagnosed yesterday and started on vancomycin .  Discontinue antibiotics, less likely pneumonia. 11/7.  Had 3 bowel movements yesterday still grade 6. 11/8.  Had 3 bowel movements overnight and did not have control of them.  Continue treatment for C. difficile.  Assessment and Plan: * C. difficile colitis Started on p.o. vancomycin  on 06/17/2024.  Had 3 bowel movements overnight.  Continue treatment for C. difficile.  Continue to monitor in the hospital.  Will start cholestyramine.  Acute on chronic diastolic CHF (congestive heart  failure) (HCC) With creatinine up to 1.02 will hold off on further Lasix.  TED hose.  Patient on low-dose Coreg and losartan .  Hypokalemia Replaced  Hypomagnesemia Replaced  CKD (chronic kidney disease) stage 2, GFR 60-89 ml/min Borderline CKD stage IIIa.  Creatinine varies with hydration status.  Acute pain of left knee Improved last 2 days.  As needed Tylenol .  Hypophosphatemia Replaced  Lobar pneumonia Ruled out.  More likely CHF.  Since patient has C. difficile colitis.  Hypoxia Pulse ox dropped down into the high 80s with ambulation 2 days ago.  Patient was able to hold her saturations with ambulation on 11/7.  Essential hypertension Blood pressure better controlled        Subjective: Patient had 3 bowel movements overnight and early morning.  Patient stated she had no control of these bowel movements.  Treating for C. difficile colitis.  Leg swelling a little bit better today.  Creatinine up this morning.  Patient feeling nervous about getting out of the hospital.  Knee pain is better.  Physical Exam: Vitals:   06/20/24 0019 06/20/24 0500 06/20/24 0536 06/20/24 0854  BP: (!) 161/82  (!) 155/72 (!) 159/90  Pulse: 75  68 84  Resp: 17  16 19   Temp: 98.7 F (37.1 C)  97.7 F (36.5 C) 98.3 F (36.8 C)  TempSrc: Oral  Oral Oral  SpO2: 94%  96% 98%  Weight:  47.5 kg    Height:       Physical Exam HENT:     Head: Normocephalic.  Eyes:     General: Lids are normal.     Conjunctiva/sclera: Conjunctivae normal.  Cardiovascular:     Rate and Rhythm: Normal rate and regular rhythm.     Heart sounds: Normal heart sounds, S1 normal and S2 normal.  Pulmonary:     Breath sounds: Examination of the right-lower field reveals decreased breath sounds. Examination of the left-lower field reveals decreased breath sounds. Decreased breath sounds present. No wheezing, rhonchi or rales.  Abdominal:     Palpations: Abdomen is soft.     Tenderness: There is no abdominal  tenderness.  Musculoskeletal:     Right lower leg: Swelling present.     Left lower leg: Swelling present.     Comments: Better range of motion of left knee today.  Skin:    General: Skin is warm.  Neurological:     Mental Status: She is alert and oriented to person, place, and time.     Data Reviewed: Creatinine 1.02 with a GFR of 52  Family Communication: Updated patient's daughter on the phone  Disposition: Status is: Inpatient Remains inpatient appropriate because: 3 bowel movements overnight hesitant on discharge.  Continue treatment for C. difficile.  Hold on further Lasix for right now with creatinine increase.  Planned Discharge Destination: Home with Home Health.  To assisted living.    Time spent: 28 minutes  Author: [name], MD 06/20/2024 11:22 AM  For on call review [email or web address].

## 2024-06-20 NOTE — Plan of Care (Signed)

## 2024-06-21 DIAGNOSIS — E876 Hypokalemia: Secondary | ICD-10-CM | POA: Diagnosis not present

## 2024-06-21 DIAGNOSIS — A0472 Enterocolitis due to Clostridium difficile, not specified as recurrent: Secondary | ICD-10-CM | POA: Diagnosis not present

## 2024-06-21 DIAGNOSIS — I5033 Acute on chronic diastolic (congestive) heart failure: Secondary | ICD-10-CM | POA: Diagnosis not present

## 2024-06-21 LAB — BASIC METABOLIC PANEL WITH GFR
Anion gap: 12 (ref 5–15)
BUN: 21 mg/dL (ref 8–23)
CO2: 24 mmol/L (ref 22–32)
Calcium: 9.8 mg/dL (ref 8.9–10.3)
Chloride: 98 mmol/L (ref 98–111)
Creatinine, Ser: 1 mg/dL (ref 0.44–1.00)
GFR, Estimated: 53 mL/min — ABNORMAL LOW (ref >60)
Glucose, Bld: 106 mg/dL — ABNORMAL HIGH (ref 70–99)
Potassium: 3.8 mmol/L (ref 3.5–5.1)
Sodium: 134 mmol/L — ABNORMAL LOW (ref 135–145)

## 2024-06-21 LAB — CBC
HCT: 35.7 % — ABNORMAL LOW (ref 36.0–46.0)
Hemoglobin: 11.5 g/dL — ABNORMAL LOW (ref 12.0–15.0)
MCH: 28.8 pg (ref 26.0–34.0)
MCHC: 32.2 g/dL (ref 30.0–36.0)
MCV: 89.3 fL (ref 80.0–100.0)
Platelets: 237 K/uL (ref 150–400)
RBC: 4 MIL/uL (ref 3.87–5.11)
RDW: 15.4 % (ref 11.5–15.5)
WBC: 12.6 K/uL — ABNORMAL HIGH (ref 4.0–10.5)
nRBC: 0 % (ref 0.0–0.2)

## 2024-06-21 NOTE — Progress Notes (Addendum)
 Physical Therapy Treatment Patient Details Name: Kellie Harris MRN: [id number] DOB: Mar 12, 1931 Today's Date: 06/21/2024   History of Present Illness Kellie Harris is a 89yoF who comes to [facility] on 06/16/24 with sudden acute onset dyspnea while sleeping, also having acute LEE. Pt reside at [facility] x3 months now. PMH:HTN, chronic HFpEF, OA,, HTN, HLD, IBS. HOH, recurrent TIA v seizure like activity followed by neurology. At baseline pt AMB household distances with 4WW, no O2, no recent falls or close calls.    PT Comments  Pt is able to get OOB with no assist.  Walks x 3 laps on unit with RW and cga x 1.  L  limp noted.  Pt attributes to arthritic pain in knee but stated it is improved today.  Encouraged and agreed to OOB in recliner.  Needs in reach and safety on. Brief applied for gait due to hx inc BM with +c-diff.   She was inc of urine but no BM upon arrival and no inc during mobility today.  Pt did say she was up walking unassisted earlier today They didn't catch me.  Reviewed use of call bell and +1 for staff for safety.  Voiced understanding.    If plan is discharge home, recommend the following: A little help with walking and/or transfers;Assist for transportation;Help with stairs or ramp for entrance;A little help with bathing/dressing/bathroom   Can travel by private vehicle        Equipment Recommendations  None recommended by PT    Recommendations for Other Services       Precautions / Restrictions Precautions Precautions: Fall Restrictions Weight Bearing Restrictions Per Provider Order: No     Mobility  Bed Mobility Overal bed mobility: Modified Independent               Patient Response: Cooperative  Transfers Overall transfer level: Needs assistance Equipment used: Rolling walker (2 wheels) Transfers: Sit to/from Stand Sit to Stand: Contact guard assist, Supervision                Ambulation/Gait Ambulation/Gait assistance: Contact guard assist,  Supervision Gait Distance (Feet): 450 Feet Assistive device: Rolling walker (2 wheels)   Gait velocity: decreased     General Gait Details: x 3 laps.  reports knee pain is improved.  no SOB noted.  brief on due to hx inc BM c-diff but is continent during session today   Stairs             Wheelchair Mobility     Tilt Bed Tilt Bed Patient Response: Cooperative  Modified Rankin (Stroke Patients Only)       Balance Overall balance assessment: Needs assistance Sitting-balance support: Feet supported, No upper extremity supported Sitting balance-Leahy Scale: Normal     Standing balance support: Bilateral upper extremity supported Standing balance-Leahy Scale: Fair                              Hotel Manager: Impaired Factors Affecting Communication: Hearing impaired  Cognition Arousal: Alert Behavior During Therapy: WFL for tasks assessed/performed   PT - Cognitive impairments: No apparent impairments                         Following commands: Intact      Cueing Cueing Techniques: Verbal cues  Exercises      General Comments        Pertinent Vitals/Pain Pain Assessment  Pain Assessment: Faces Faces Pain Scale: Hurts little more Pain Location: L knee - attributes to arthritic pain.  R has been replaced.  I am too old to have this one done Pain Descriptors / Indicators: Aching, Dull, Discomfort, Grimacing, Guarding Pain Intervention(s): Monitored during session, Repositioned, Limited activity within patient's tolerance    Home Living                          Prior Function            PT Goals (current goals can now be found in the care plan section) Progress towards PT goals: Progressing toward goals    Frequency    Min 2X/week      PT Plan      Co-evaluation              AM-PAC PT 6 Clicks Mobility   Outcome Measure  Help needed turning from your back to your  side while in a flat bed without using bedrails?: None Help needed moving from lying on your back to sitting on the side of a flat bed without using bedrails?: None Help needed moving to and from a bed to a chair (including a wheelchair)?: A Little Help needed standing up from a chair using your arms (e.g., wheelchair or bedside chair)?: A Little Help needed to walk in hospital room?: A Little Help needed climbing 3-5 steps with a railing? : A Little 6 Click Score: 20    End of Session   Activity Tolerance: Patient tolerated treatment well Patient left: in chair;with call bell/phone within reach;with chair alarm set Nurse Communication: Mobility status PT Visit Diagnosis: Difficulty in walking, not elsewhere classified (R26.2);Other abnormalities of gait and mobility (R26.89);Muscle weakness (generalized) (M62.81)     Time: 8666-8648 PT Time Calculation (min) (ACUTE ONLY): 18 min  Charges:    $Gait Training: 8-22 mins PT General Charges $$ ACUTE PT VISIT: 1 Visit                   Lauraine Gills, PTA 06/21/24, 2:03 PM

## 2024-06-21 NOTE — Progress Notes (Signed)
 Attempted to call report for patient transfer. No answer when transferred. Will attempt to call again.

## 2024-06-21 NOTE — Progress Notes (Signed)
 In handoff report this morning with Nena, RN it was reported that overnight in the early hours of the morning the patient had another episode of stool incontinence with confusion. It was reported that the patient had the BM in her sleep and had attempted to clean this up on her own. Patient states that she had a mess and was embarrassed about this. Reported to Dr. Ginnie and will continue to monitor closely for patient's bowel movement changes.

## 2024-06-21 NOTE — Plan of Care (Signed)

## 2024-06-21 NOTE — Progress Notes (Signed)
 Report from day shift reported that patient lost a hearing aid. Patient reports that she thinks she lost it while in bed, but cannot exactly recall when she realized it was missing.

## 2024-06-21 NOTE — Progress Notes (Signed)
 Progress Note   Patient: Kellie Harris FMW:[id number] DOB: October 24, 1930 DOA: 06/16/2024     4 DOS: the patient was seen and examined on 06/21/2024   Brief hospital course: 89 y.o. female with medical history significant of HTN, chronic HFpEF, multiple OA's, HTN, HLD, IBS presented with worsening of shortness of breath and leg swelling.   Symptoms started about 1 week ago, patient started to notice increasing ankle edema as well as Mcclatchy cough and increasing exertional dyspnea.  She denied any chest pain, no fever or chills.  Last night, she could not lay flat on the bed because of increasing shortness of breath and decided to come to ED. patient also reported that last 7 days she has had a flareup of her IBS with diarrhea watery problems 1-2 times a day but denied any abdominal pain, she has tried Imodium  with some improvement.   ED Course: Afebrile, tachycardia blood pressure slightly elevated 160/80 O2 saturation 100% on room air.  CTA negative for PE but mild pulmonary edema, blood work showed K3.0 bicarb 25 BUN 16 creatinine 1.0, WBC 8.9 hemoglobin 11.8, COVID-negative.   Patient was given IV Lasix 20 mg x 1 in the ED.  11/5.  Patient's daughter stated that diarrhea has been worse and foul-smelling.  Will send off stool studies.  Started on Zithromax and Rocephin for patchy infiltrate on chest x-ray.  Replacing magnesium  and potassium.  Continue Lasix. 11/6.  C. difficile diagnosed yesterday and started on vancomycin .  Discontinue antibiotics, less likely pneumonia. 11/7.  Had 3 bowel movements yesterday still grade 6. 11/8.  Had 3 bowel movements overnight and did not have control of them.  Continue treatment for C. difficile.  Assessment and Plan: * C. difficile colitis Started on p.o. vancomycin  on 06/17/2024.  Had 2 uncontrollable bowel movements overnight and early morning.  Continue treatment for C. difficile.  Continue to monitor in the hospital.  Patient did not want cholestyramine.  Acute  on chronic diastolic CHF (congestive heart failure) (HCC) With creatinine up to 1.00.  Will hold off on further Lasix.  TED hose.  Patient on low-dose Coreg and losartan .  Hypokalemia Replaced  Hypomagnesemia Replaced  CKD (chronic kidney disease) stage 2, GFR 60-89 ml/min Borderline CKD stage IIIa.  Creatinine varies with hydration status.  Acute pain of left knee Improved. As needed Tylenol .  Hypophosphatemia Replaced  Lobar pneumonia Ruled out.    Hypoxia Pulse ox dropped down into the high 80s with ambulation earlier in the hospital course.  Patient was able to hold her saturations with ambulation on 11/7.  Essential hypertension Blood pressure better controlled        Subjective: Patient had 1 bowel movement overnight and 1 this morning that she could not control.  On treatment for C. difficile colitis.  Physical Exam: Vitals:   06/21/24 0500 06/21/24 0801 06/21/24 1005 06/21/24 1212  BP:  134/86 (!) 151/86 (!) 156/81  Pulse:  74 76 69  Resp:      Temp:  97.9 F (36.6 C)  98.1 F (36.7 C)  TempSrc:    Oral  SpO2:  95%  97%  Weight: 43.7 kg     Height:       Physical Exam HENT:     Head: Normocephalic.  Eyes:     General: Lids are normal.     Conjunctiva/sclera: Conjunctivae normal.  Cardiovascular:     Rate and Rhythm: Normal rate and regular rhythm.     Heart sounds: Normal heart sounds,  S1 normal and S2 normal.  Pulmonary:     Breath sounds: Examination of the right-lower field reveals decreased breath sounds. Examination of the left-lower field reveals decreased breath sounds. Decreased breath sounds present. No wheezing, rhonchi or rales.  Abdominal:     Palpations: Abdomen is soft.     Tenderness: There is no abdominal tenderness.  Musculoskeletal:     Right lower leg: Swelling present.     Left lower leg: Swelling present.  Skin:    General: Skin is warm.  Neurological:     Mental Status: She is alert and oriented to person, place, and  time.     Data Reviewed: Sodium 134, creatinine 1.0, GFR 53, white blood cell count 12.6, hemoglobin 11.5, platelet count 237  Family Communication: Updated patient's daughter on the phone  Disposition: Status is: Inpatient Patient had an uncontrollable bowel movement last night and this morning.  Continue treatment for C. difficile.  Monitor on day-to-day basis on when to go back to assisted living  Planned Discharge Destination: Home health and assisted living    Time spent: 28 minutes  Author: [name], MD 06/21/2024 1:46 PM  For on call review [email or web address].

## 2024-06-21 NOTE — Progress Notes (Signed)
 Called report to patient transferring to 104. All questions and concerns addressed.

## 2024-06-22 DIAGNOSIS — I5033 Acute on chronic diastolic (congestive) heart failure: Secondary | ICD-10-CM | POA: Diagnosis not present

## 2024-06-22 DIAGNOSIS — E876 Hypokalemia: Secondary | ICD-10-CM | POA: Diagnosis not present

## 2024-06-22 DIAGNOSIS — A0472 Enterocolitis due to Clostridium difficile, not specified as recurrent: Secondary | ICD-10-CM | POA: Diagnosis not present

## 2024-06-22 DIAGNOSIS — J9601 Acute respiratory failure with hypoxia: Secondary | ICD-10-CM

## 2024-06-22 LAB — BLOOD GAS, ARTERIAL
Acid-Base Excess: 2.2 mmol/L — ABNORMAL HIGH (ref 0.0–2.0)
Bicarbonate: 25.1 mmol/L (ref 20.0–28.0)
O2 Saturation: 94.7 %
Patient temperature: 37
pCO2 arterial: 33 mmHg (ref 32–48)
pH, Arterial: 7.49 — ABNORMAL HIGH (ref 7.35–7.45)
pO2, Arterial: 68 mmHg — ABNORMAL LOW (ref 83–108)

## 2024-06-22 LAB — BASIC METABOLIC PANEL WITH GFR
Anion gap: 12 (ref 5–15)
BUN: 17 mg/dL (ref 8–23)
CO2: 21 mmol/L — ABNORMAL LOW (ref 22–32)
Calcium: 9.2 mg/dL (ref 8.9–10.3)
Chloride: 102 mmol/L (ref 98–111)
Creatinine, Ser: 0.95 mg/dL (ref 0.44–1.00)
GFR, Estimated: 57 mL/min — ABNORMAL LOW (ref >60)
Glucose, Bld: 85 mg/dL (ref 70–99)
Potassium: 3.8 mmol/L (ref 3.5–5.1)
Sodium: 135 mmol/L (ref 135–145)

## 2024-06-22 MED ORDER — LOSARTAN POTASSIUM 50 MG PO TABS
100.0000 mg | ORAL_TABLET | Freq: Every day | ORAL | Status: DC
  Administered 2024-06-22 – 2024-06-23 (×2): 100 mg via ORAL
  Filled 2024-06-22 (×2): qty 2

## 2024-06-22 MED ORDER — FUROSEMIDE 10 MG/ML IJ SOLN
40.0000 mg | Freq: Once | INTRAMUSCULAR | Status: AC
  Administered 2024-06-23: 40 mg via INTRAVENOUS
  Filled 2024-06-22: qty 4

## 2024-06-22 MED ORDER — POTASSIUM CHLORIDE CRYS ER 20 MEQ PO TBCR
20.0000 meq | EXTENDED_RELEASE_TABLET | Freq: Once | ORAL | Status: AC
  Administered 2024-06-22: 20 meq via ORAL
  Filled 2024-06-22: qty 1

## 2024-06-22 MED ORDER — FUROSEMIDE 10 MG/ML IJ SOLN
40.0000 mg | Freq: Once | INTRAMUSCULAR | Status: AC
  Administered 2024-06-22: 40 mg via INTRAVENOUS
  Filled 2024-06-22: qty 4

## 2024-06-22 MED ORDER — CARVEDILOL 6.25 MG PO TABS
6.2500 mg | ORAL_TABLET | Freq: Two times a day (BID) | ORAL | Status: DC
  Administered 2024-06-22 – 2024-06-23 (×2): 6.25 mg via ORAL
  Filled 2024-06-22 (×2): qty 1

## 2024-06-22 MED ORDER — POTASSIUM CHLORIDE CRYS ER 20 MEQ PO TBCR
20.0000 meq | EXTENDED_RELEASE_TABLET | Freq: Once | ORAL | Status: AC
  Administered 2024-06-23: 20 meq via ORAL
  Filled 2024-06-22: qty 1

## 2024-06-22 NOTE — Care Management Important Message (Signed)
 Important Message  Patient Details  Name: Kellie Harris MRN: [id number] Date of Birth: 10-31-1930   Important Message Given:  Yes - Medicare IM     Raymonda Pell W, CMA 06/22/2024, 12:15 PM

## 2024-06-22 NOTE — Progress Notes (Signed)
 Progress Note   Patient: Kellie Harris FMW:[id number] DOB: 08-Feb-1931 DOA: 06/16/2024     5 DOS: the patient was seen and examined on 06/22/2024   Brief hospital course: 89 y.o. female with medical history significant of HTN, chronic HFpEF, multiple OA's, HTN, HLD, IBS presented with worsening of shortness of breath and leg swelling.   Symptoms started about 1 week ago, patient started to notice increasing ankle edema as well as Hoying cough and increasing exertional dyspnea.  She denied any chest pain, no fever or chills.  Last night, she could not lay flat on the bed because of increasing shortness of breath and decided to come to ED. patient also reported that last 7 days she has had a flareup of her IBS with diarrhea watery problems 1-2 times a day but denied any abdominal pain, she has tried Imodium  with some improvement.   ED Course: Afebrile, tachycardia blood pressure slightly elevated 160/80 O2 saturation 100% on room air.  CTA negative for PE but mild pulmonary edema, blood work showed K3.0 bicarb 25 BUN 16 creatinine 1.0, WBC 8.9 hemoglobin 11.8, COVID-negative.   Patient was given IV Lasix 20 mg x 1 in the ED.  11/5.  Patient's daughter stated that diarrhea has been worse and foul-smelling.  Will send off stool studies.  Started on Zithromax and Rocephin for patchy infiltrate on chest x-ray.  Replacing magnesium  and potassium.  Continue Lasix. 11/6.  C. difficile diagnosed yesterday and started on vancomycin .  Discontinue antibiotics, less likely pneumonia. 11/7.  Had 3 bowel movements yesterday still grade 6. 11/8.  Had 3 bowel movements overnight and did not have control of them.  Continue treatment for C. difficile.  Assessment and Plan: * Acute hypoxic respiratory failure (HCC) Pulse ox dropped down into the 80s today with ambulation.  At rest nurse also got up to around 88% on saturations.  ABG shows a pO2 of 68.  Will continue oxygen today and give a dose of Lasix today and again  tomorrow morning to see if we can get off oxygen.  May have to send home with oxygen.  C. difficile colitis Started on p.o. vancomycin  on 06/17/2024.  Stool graded as stage V last night and again this morning.  Seems to be improving.  Acute on chronic diastolic CHF (congestive heart failure) (HCC) With creatinine 0.95 and patient hypoxic today restart IV Lasix to see if we can get off oxygen.  Increase dose of Coreg and losartan .  Hypokalemia Replace with IV Lasix  Hypomagnesemia Replaced  CKD (chronic kidney disease) stage 2, GFR 60-89 ml/min Borderline CKD stage IIIa.  Creatinine varies with hydration status.  Acute pain of left knee Improved. As needed Tylenol .  Hypophosphatemia Replaced  Lobar pneumonia Ruled out.    Essential hypertension Blood pressure high this morning.  Increase Coreg this evening and losartan  increased this morning.        Subjective: Today patient's pulse ox is low with walking.  Will give a dose of IV Lasix this afternoon and again tomorrow morning.  Diarrhea seems to be improving.  Physical Exam: Vitals:   06/22/24 0802 06/22/24 1135 06/22/24 1140 06/22/24 1150  BP: (!) 175/92     Pulse: 71     Resp: 16     Temp: 98 F (36.7 C)     TempSrc: Oral     SpO2: 94% (!) 88% (!) 84% (!) 88%  Weight:      Height:       Physical Exam  HENT:     Head: Normocephalic.  Eyes:     General: Lids are normal.     Conjunctiva/sclera: Conjunctivae normal.  Cardiovascular:     Rate and Rhythm: Normal rate and regular rhythm.     Heart sounds: Normal heart sounds, S1 normal and S2 normal.  Pulmonary:     Breath sounds: Examination of the right-lower field reveals decreased breath sounds. Examination of the left-lower field reveals decreased breath sounds. Decreased breath sounds present. No wheezing, rhonchi or rales.  Abdominal:     Palpations: Abdomen is soft.     Tenderness: There is no abdominal tenderness.  Musculoskeletal:     Right lower  leg: Swelling present.     Left lower leg: Swelling present.  Skin:    General: Skin is warm.  Neurological:     Mental Status: She is alert and oriented to person, place, and time.     Data Reviewed: ABG shows a pH of 7.49, pCO2 of 68 pulse ox 94.7%, creatinine 0.95, potassium 3.8, sodium 135 Family Communication: Spoke with patient's daughter twice today  Disposition: Status is: Inpatient Remains inpatient appropriate because: With patient dropping her pulse ox will restart IV Lasix and recheck again tomorrow.  Trying to see if we can send her home without oxygen.  Planned Discharge Destination: Home with Home Health    Time spent: 28 minutes  Author: [name], MD 06/22/2024 1:47 PM  For on call review [email or web address].

## 2024-06-22 NOTE — Progress Notes (Signed)
 Mobility Specialist - Progress Note   06/22/24 1042  Mobility  Activity Ambulated with assistance  Level of Assistance Standby assist, set-up cues, supervision of patient - no hands on  Assistive Device Front wheel walker  Distance Ambulated (ft) 320 ft  Activity Response Tolerated well  Mobility visit 1 Mobility  Mobility Specialist Start Time (ACUTE ONLY) 1022  Mobility Specialist Stop Time (ACUTE ONLY) 1041  Mobility Specialist Time Calculation (min) (ACUTE ONLY) 19 min   Pt amb two laps around the NS MinG-SBA, tolerated well. Pt O2 84% upon return to room, rising to 90% after one min seated pursed lip breathing. Pt left supine with alarm set and needs within reach. RN notified.  America Silvan Mobility Specialist 06/22/24 10:44 AM

## 2024-06-22 NOTE — Plan of Care (Signed)
 Patient arrived to unit/transferred from 2A.  Patient noted to be alert and oriented.  Has verbally denied any acute pain/discomfort.  Remains free from any noted signs of associated symptoms.  No additional medical interventions required at this time.  Patient to continue to be monitored by hospital staff.

## 2024-06-22 NOTE — Plan of Care (Signed)
  Problem: Activity: Goal: Risk for activity intolerance will decrease Outcome: Progressing   Problem: Nutrition: Goal: Adequate nutrition will be maintained Outcome: Progressing   Problem: Coping: Goal: Level of anxiety will decrease Outcome: Progressing   Problem: Pain Managment: Goal: General experience of comfort will improve and/or be controlled Outcome: Progressing   Problem: Safety: Goal: Ability to remain free from injury will improve Outcome: Progressing

## 2024-06-22 NOTE — Progress Notes (Signed)
 Occupational Therapy Treatment Patient Details Name: Kellie Harris MRN: [id number] DOB: October 10, 1930 Today's Date: 06/22/2024   History of present illness Kellie Harris is a 89yoF who comes [facility] on [facility]16[facility]24 with sudden acute onset dyspnea while sleeping, also having acute LEE. Pt reside at [facility] x3 months now. PMH:HTN, chronic HFpEF, OA,, HTN, HLD, IBS. HOH, recurrent TIA v seizure like activity followed by neurology. At baseline pt AMB household distances with 4WW, no O2, no recent falls or close calls.   OT comments  Pt seen for OT tx. Pt denies complaints, feels like her breathing is a little better this afternoon. On 2L O2 at start of session. Trialed on room air to ambulate to the bathroom. Required set up for donning shoes EOB, SBA for STS from EOB, and CGA for STS from standard height toilet. Supv for seated pericare. Once returned to EOB, SpO2 85-86% quickly improving with sitting rest break to 89-90% on room air. Left on 2L, SpO2 >96%. Progressing, continues to benefit.       If plan is discharge home, recommend the following:  A little help with walking and/or transfers;A little help with bathing/dressing/bathroom   Equipment Recommendations  None recommended by OT    Recommendations for Other Services      Precautions / Restrictions Precautions Precautions: Fall Recall of Precautions/Restrictions: Intact Restrictions Weight Bearing Restrictions Per Provider Order: No       Mobility Bed Mobility Overal bed mobility: Modified Independent                  Transfers Overall transfer level: Needs assistance Equipment used: Rolling walker (2 wheels) Transfers: Sit to/from Stand Sit to Stand: Contact guard assist, Supervision           General transfer comment: supv from EOB, CGA from toilet     Balance Overall balance assessment: Needs assistance Sitting-balance support: Feet supported, No upper extremity supported Sitting balance-Leahy Scale: Normal      Standing balance support: Bilateral upper extremity supported Standing balance-Leahy Scale: Fair                             ADL either performed or assessed with clinical judgement   ADL Overall ADL's : Needs assistance/impaired                     Lower Body Dressing: Sitting/lateral leans;Set up Lower Body Dressing Details (indicate cue type and reason): slip on shoes Toilet Transfer: Contact guard assist;Regular Toilet;Rolling walker (2 wheels);Ambulation;Grab bars Toilet Transfer Details (indicate cue type and reason): increased effort to sit and stand from std height toilet Toileting- Clothing Manipulation and Hygiene: Sitting/lateral lean;Supervision/safety       Functional mobility during ADLs: Supervision/safety;Contact guard assist;Rolling walker (2 wheels)      Extremity/Trunk Assessment              Vision       Perception     Praxis     Communication Communication Communication: Impaired Factors Affecting Communication: Hearing impaired   Cognition Arousal: Alert Behavior During Therapy: WFL for tasks assessed/performed Cognition: No apparent impairments                               Following commands: Intact        Cueing   Cueing Techniques: Verbal cues  Exercises      Shoulder  Instructions       General Comments 2L at rest >92%, walked to the bathroom on room air, SpO2 85-86% quickly improving with sitting rest break to 89-90% on room air. Left on 2L, SpO2 >96%    Pertinent Vitals/ Pain       Pain Assessment Pain Assessment: No/denies pain   Frequency  Min 2X/week        Progress Toward Goals  OT Goals(current goals can now be found in the care plan section)  Progress towards OT goals: Progressing toward goals  Acute Rehab OT Goals Patient Stated Goal: go home OT Goal Formulation: With patient Time For Goal Achievement: 07/01/24 Potential to Achieve Goals: Good  Plan       Co-evaluation                 AM-PAC OT 6 Clicks Daily Activity     Outcome Measure   Help from another person eating meals?: None Help from another person taking care of personal grooming?: None Help from another person toileting, which includes using toliet, bedpan, or urinal?: A Little Help from another person bathing (including washing, rinsing, drying)?: A Little Help from another person to put on and taking off regular upper body clothing?: None Help from another person to put on and taking off regular lower body clothing?: A Little 6 Click Score: 21    End of Session Equipment Utilized During Treatment: Rolling walker (2 wheels);Oxygen  OT Visit Diagnosis: Unsteadiness on feet (R26.81);Muscle weakness (generalized) (M62.81)   Activity Tolerance Patient tolerated treatment well   Patient Left in bed;with call bell/phone within reach;with bed alarm set   Nurse Communication          Time: 8482-8465 OT Time Calculation (min): 17 min  Charges: OT General Charges $OT Visit: 1 Visit OT Treatments $Self Care/Home Management : 8-22 mins  Warren SAUNDERS., MPH, MS, OTR/L ascom [phone] 06/22/24, 4:39 PM

## 2024-06-22 NOTE — Plan of Care (Signed)

## 2024-06-22 NOTE — Assessment & Plan Note (Signed)
 Pulse ox dropped down into the 80s today with ambulation.  At rest nurse also got up to around 88% on saturations.  ABG shows a pO2 of 68.  Will continue oxygen today and give a dose of Lasix today and again tomorrow morning to see if we can get off oxygen.  May have to send home with oxygen.

## 2024-06-23 ENCOUNTER — Other Ambulatory Visit: Payer: Self-pay

## 2024-06-23 DIAGNOSIS — E876 Hypokalemia: Secondary | ICD-10-CM | POA: Diagnosis not present

## 2024-06-23 DIAGNOSIS — A0472 Enterocolitis due to Clostridium difficile, not specified as recurrent: Secondary | ICD-10-CM | POA: Diagnosis not present

## 2024-06-23 DIAGNOSIS — J9601 Acute respiratory failure with hypoxia: Secondary | ICD-10-CM | POA: Diagnosis not present

## 2024-06-23 DIAGNOSIS — I5033 Acute on chronic diastolic (congestive) heart failure: Secondary | ICD-10-CM | POA: Diagnosis not present

## 2024-06-23 LAB — BASIC METABOLIC PANEL WITH GFR
Anion gap: 12 (ref 5–15)
BUN: 20 mg/dL (ref 8–23)
CO2: 24 mmol/L (ref 22–32)
Calcium: 9.7 mg/dL (ref 8.9–10.3)
Chloride: 99 mmol/L (ref 98–111)
Creatinine, Ser: 1.18 mg/dL — ABNORMAL HIGH (ref 0.44–1.00)
GFR, Estimated: 44 mL/min — ABNORMAL LOW (ref >60)
Glucose, Bld: 94 mg/dL (ref 70–99)
Potassium: 4 mmol/L (ref 3.5–5.1)
Sodium: 135 mmol/L (ref 135–145)

## 2024-06-23 MED ORDER — VANCOMYCIN HCL 125 MG PO CAPS
125.0000 mg | ORAL_CAPSULE | Freq: Four times a day (QID) | ORAL | 0 refills | Status: AC
  Filled 2024-06-23: qty 36, 9d supply, fill #0

## 2024-06-23 MED ORDER — CARTEOLOL HCL 1 % OP SOLN: 1.0000 [drp] | Freq: Two times a day (BID) | OPHTHALMIC | Status: DC

## 2024-06-23 MED ORDER — FUROSEMIDE 20 MG PO TABS
20.0000 mg | ORAL_TABLET | Freq: Every day | ORAL | 0 refills | Status: DC
  Filled 2024-06-23: qty 30, 30d supply, fill #0

## 2024-06-23 MED ORDER — CARVEDILOL 6.25 MG PO TABS
6.2500 mg | ORAL_TABLET | Freq: Two times a day (BID) | ORAL | 0 refills | Status: DC
  Filled 2024-06-23: qty 60, 30d supply, fill #0

## 2024-06-23 MED ORDER — POTASSIUM CHLORIDE CRYS ER 10 MEQ PO TBCR
10.0000 meq | EXTENDED_RELEASE_TABLET | Freq: Every day | ORAL | 0 refills | Status: DC
  Filled 2024-06-23: qty 30, 30d supply, fill #0

## 2024-06-23 MED ORDER — LOSARTAN POTASSIUM 100 MG PO TABS
100.0000 mg | ORAL_TABLET | Freq: Every day | ORAL | 0 refills | Status: DC
  Filled 2024-06-23: qty 30, 30d supply, fill #0

## 2024-06-23 NOTE — Discharge Instructions (Signed)
 No lasix today or tomorrow, can start on 06/25/24

## 2024-06-23 NOTE — Plan of Care (Signed)

## 2024-06-23 NOTE — TOC Progression Note (Signed)
 Transition of Care [facility]) - Progression Note    Patient Details  Name: KENNEDEY DIGILIO MRN: [id number] Date of Birth: 06-May-1931  Transition of Care [facility]) CM/SW Contact  Lauraine JAYSON Carpen, LCSW Phone Number: 06/23/2024, 8:45 AM  Clinical Narrative:   CSW continues to follow for return to [facility] with home health.  Expected Discharge Plan: Assisted Living (with home health.) Barriers to Discharge: Continued Medical Work up               Expected Discharge Plan and Services     Post Acute Care Choice: Home Health Living arrangements for the past 2 months: Assisted Living Facility                                       Social Drivers of Health (SDOH) Interventions SDOH Screenings   Food Insecurity: No Food Insecurity (06/16/2024)  Housing: Low Risk  (06/16/2024)  Transportation Needs: No Transportation Needs (06/16/2024)  Utilities: Not At Risk (06/16/2024)  Financial Resource Strain: Low Risk  (07/17/2023)   Received from [facility]  Physical Activity: Unknown (11/04/2017)  Social Connections: Socially Isolated (06/16/2024)  Stress: No Stress Concern Present (11/04/2017)  Tobacco Use: Low Risk  (06/16/2024)    Readmission Risk Interventions     No data to display

## 2024-06-23 NOTE — [place] FL2 (Signed)
 [place]  MEDICAID FL2 LEVEL OF CARE FORM     IDENTIFICATION  Patient Name: Kellie Harris Birthdate: [birth year] Sex: female Admission Date (Current Location): 06/16/2024  [facility] and [place] Number:  Chiropodist and Address:  [facility], [address], [place], [place] [address]      Provider Number: [id number]  Attending Physician Name and Address:  Josette Ade, MD  Relative Name and Phone Number:  Scotlyn Mccranie [phone]    Current Level of Care: Hospital Recommended Level of Care: Assisted Living Facility Prior Approval Number:    Date Approved/Denied:   PASRR Number:    Discharge Plan:  (ALF)    Current Diagnoses: Patient Active Problem List   Diagnosis Date Noted   Acute hypoxic respiratory failure (HCC) 06/22/2024   CKD (chronic kidney disease) stage 2, GFR 60-89 ml/min 06/20/2024   C. difficile colitis 06/18/2024   Hypophosphatemia 06/18/2024   Acute pain of left knee 06/18/2024   Hypomagnesemia 06/17/2024   Hypoxia 06/17/2024   Lobar pneumonia 06/17/2024   Acute on chronic diastolic CHF (congestive heart failure) (HCC) 06/16/2024   Expressive aphasia 02/18/2024   Altered mental status 02/18/2024   Hyponatremia 09/08/2021   Acute blood loss anemia 09/08/2021   Hypokalemia 09/07/2021   History of total hip replacement, left 09/04/2021   Dizziness 01/28/2021   Aphasia 05/03/2020   S/P ORIF (open reduction internal fixation) fracture radius and ulnar fx 04/08/18 04/10/2018   Distal radius and ulna fracture, right    History of CVA (cerebrovascular accident)    Tachycardia    Leukocytosis    Seizures (HCC)    Stroke-like episode (HCC) s/p IV tPA 04/03/2018   Syncope    TIA (transient ischemic attack) 11/04/2017   Malnutrition of moderate degree 08/26/2017   Total knee replacement status 08/07/2017   Closed left hip fracture (HCC) 02/12/2017   Essential hypertension 02/12/2017   HLD (hyperlipidemia) 02/12/2017     Orientation RESPIRATION BLADDER Height & Weight     Self, Time, Situation, Place  O2 (2L [place]) Continent Weight: 44.9 kg Height:  5' 4 (162.6 cm)  BEHAVIORAL SYMPTOMS/MOOD NEUROLOGICAL BOWEL NUTRITION STATUS      Continent Diet (Heart)  AMBULATORY STATUS COMMUNICATION OF NEEDS Skin   Limited Assist Verbally Other (Comment) (Ecchymosis- Bilateral Arm, Erythema- Bilateral Leg)                       Personal Care Assistance Level of Assistance  Bathing, Dressing Bathing Assistance: Limited assistance   Dressing Assistance: Limited assistance     Functional Limitations Info  Hearing   Hearing Info: Impaired (Hard of Hearing)      SPECIAL CARE FACTORS FREQUENCY  PT (By licensed PT), OT (By licensed OT)     PT Frequency: 5 x week OT Frequency: 5 x week            Contractures Contractures Info: Not present    Additional Factors Info  Code Status, Allergies Code Status Info: DNR- Limited DNI Allergies Info: No Known Allergies               Discharge Medications:  Allergies as of 06/23/2024   No Known Allergies         Medication List       STOP taking these medications     amLODipine  10 MG tablet Commonly known as: NORVASC     cyanocobalamin  1000 MCG tablet    loperamide  2 MG tablet Commonly known as:  IMODIUM  A-D    metoprolol  succinate 25 MG 24 hr tablet Commonly known as: TOPROL -XL    Probiotic (Lactobacillus) Caps    timolol  0.5 % ophthalmic solution Commonly known as: TIMOPTIC            TAKE these medications     acetaminophen  500 MG tablet Commonly known as: TYLENOL  Take 1,000 mg by mouth in the morning and at bedtime. 0900/2100    aspirin  81 MG chewable tablet Chew 1 tablet (81 mg total) by mouth daily.    atorvastatin  20 MG tablet Commonly known as: LIPITOR Take 20 mg by mouth at bedtime. What changed: Another medication with the same name was removed. Continue taking this medication, and follow the directions you  see here.    carteolol 1 % ophthalmic solution Commonly known as: OCUPRESS Place 1 drop into both eyes 2 (two) times daily. 0900/2100 What changed: how much to take    carvedilol 6.25 MG tablet Commonly known as: COREG Take 1 tablet (6.25 mg total) by mouth 2 (two) times daily with a meal.    furosemide 20 MG tablet Commonly known as: Lasix Take 1 tablet (20 mg total) by mouth daily. Start taking on: June 25, 2024    lamoTRIgine  100 MG tablet Commonly known as: LAMICTAL  Take 100 mg by mouth at bedtime. What changed: Another medication with the same name was removed. Continue taking this medication, and follow the directions you see here.    lamoTRIgine  100 MG tablet Commonly known as: LAMICTAL  Take 100 mg by mouth in the morning. What changed: Another medication with the same name was removed. Continue taking this medication, and follow the directions you see here.    lamoTRIgine  25 MG tablet Commonly known as: LAMICTAL  Take 25 mg by mouth at bedtime. What changed: Another medication with the same name was removed. Continue taking this medication, and follow the directions you see here.    latanoprost  0.005 % ophthalmic solution Commonly known as: XALATAN  Place 1 drop into both eyes at bedtime.    losartan  100 MG tablet Commonly known as: COZAAR  Take 1 tablet (100 mg total) by mouth daily. Start taking on: June 24, 2024 What changed:  medication strength how much to take    pantoprazole  20 MG tablet Commonly known as: Protonix  Take 1 tablet (20 mg total) by mouth daily.    potassium chloride  10 MEQ tablet Commonly known as: KLOR-CON  M Take 1 tablet (10 mEq total) by mouth daily. Start taking on: June 25, 2024    vancomycin  125 MG capsule Commonly known as: VANCOCIN  Take 1 capsule (125 mg total) by mouth 4 (four) times daily for 9 days.    Vitamin D  50 MCG (2000 UT) tablet Take 2,000 Units by mouth daily.    Please see discharge summary for a  list of discharge medications.  Relevant Imaging Results:  Relevant Lab Results:   Additional Information SS# [id number]  Daved JONETTA Hamilton, RN

## 2024-06-23 NOTE — Progress Notes (Addendum)
 SATURATION QUALIFICATIONS: (This note is used to comply with regulatory documentation for home oxygen)  Patient Saturations on Room Air at Rest = 85%  Patient Saturations on Room Air while Ambulating = 83%  Patient Saturations on 3 Liters of oxygen while Ambulating = 90%  Approved by Dr Josette

## 2024-06-23 NOTE — Discharge Summary (Signed)
 Physician Discharge Summary   Patient: Kellie Harris MRN: [id number] DOB: March 24, 1931  Admit date:     06/16/2024  Discharge date: 06/23/24  Discharge Physician: [name]   PCP: [name], MD   Recommendations at discharge:    Follow up pcp 5 days Follow up chf clinic  Discharge Diagnoses: Principal Problem:   Acute hypoxic respiratory failure (HCC) Active Problems:   Acute on chronic diastolic CHF (congestive heart failure) (HCC)   C. difficile colitis   Hypokalemia   Hypomagnesemia   Essential hypertension   Hypoxia   Lobar pneumonia   Hypophosphatemia   Acute pain of left knee   CKD (chronic kidney disease) stage 2, GFR 60-89 ml/min    Hospital Course: 89 y.o. female with medical history significant of HTN, chronic HFpEF, multiple OA's, HTN, HLD, IBS presented with worsening of shortness of breath and leg swelling.   Symptoms started about 1 week ago, patient started to notice increasing ankle edema as well as [place] cough and increasing exertional dyspnea.  She denied any chest pain, no fever or chills.  Last night, she could not lay flat on the bed because of increasing shortness of breath and decided to come to ED. patient also reported that last 7 days she has had a flareup of her IBS with diarrhea watery problems 1-2 times a day but denied any abdominal pain, she has tried Imodium  with some improvement.   ED Course: Afebrile, tachycardia blood pressure slightly elevated 160/80 O2 saturation 100% on room air.  CTA negative for PE but mild pulmonary edema, blood work showed K3.0 bicarb 25 BUN 16 creatinine 1.0, WBC 8.9 hemoglobin 11.8, COVID-negative.   Patient was given IV Lasix 20 mg x 1 in the ED.  11/5.  Patient's daughter stated that diarrhea has been worse and foul-smelling.  Will send off stool studies.  Started on Zithromax and Rocephin for patchy infiltrate on chest x-ray.  Replacing magnesium  and potassium.  Continue Lasix. 11/6.  C. difficile  diagnosed yesterday and started on vancomycin .  Discontinue antibiotics, less likely pneumonia. 11/7.  Had 3 bowel movements yesterday still grade 6. 11/8.  Had 3 bowel movements overnight and did not have control of them.  Continue treatment for C. difficile. 11/9. Had uncontrollable BM overnight 11/10.  Patient hypoxic after walking. ABG showed po2 of 68.  Gave dose iv lasix. 11/11.  Unable to come off oxygen, will set up with home oxygen.  Bowel movements are stage 5 and able to be controlled.  Assessment and Plan: * Acute hypoxic respiratory failure (HCC) Pulse ox dropped down into the 80s today with ambulation on 11/10.  ABG shows a pO2 of 68.  Tried to give iv lasix too see if we could get off oxygen.  Patient qualifies for oxygen at home.  Will set up upon discharge.  C. difficile colitis Started on p.o. vancomycin  on 06/17/2024.  Will give another 9 days upon discharge.  Acute on chronic diastolic CHF (congestive heart failure) (HCC) Patient was give iv lasix during the hospital course and then was hel.  Yesterda, I again gave iv lasix to try and get off oxygen.  Contniue increased dose coreg and cozaar .  Change lasix to oral starting on 06/25/24.  Hypokalemia Replace with lasix.  Recommend checking a bmp with follow up appointment.  Hypomagnesemia Replaced  CKD (chronic kidney disease) stage 2, GFR 60-89 ml/min Borderline CKD stage IIIa.  Creatinine varies with hydration status. Creatinine up this am secondary to iv lasix  yesterday trying to get off oxygen.  Recommend bmp with follow up appointment.  Hold lasix tomorrow and low dose starting on 06/25/24.  Acute pain of left knee Improved. As needed Tylenol .  Hypophosphatemia Replaced  Lobar pneumonia Ruled out.    Essential hypertension Continue increased dose of coreg and cozaar .         Consultants: none Procedures performed: none  Disposition: Home health Diet recommendation:  Cardiac diet DISCHARGE  MEDICATION: Allergies as of 06/23/2024   No Known Allergies      Medication List     STOP taking these medications    amLODipine  10 MG tablet Commonly known as: NORVASC    cyanocobalamin  1000 MCG tablet   loperamide  2 MG tablet Commonly known as: IMODIUM  A-D   metoprolol  succinate 25 MG 24 hr tablet Commonly known as: TOPROL -XL   Probiotic (Lactobacillus) Caps   timolol  0.5 % ophthalmic solution Commonly known as: TIMOPTIC        TAKE these medications    acetaminophen  500 MG tablet Commonly known as: TYLENOL  Take 1,000 mg by mouth in the morning and at bedtime. 0900/2100   aspirin  81 MG chewable tablet Chew 1 tablet (81 mg total) by mouth daily.   atorvastatin  20 MG tablet Commonly known as: LIPITOR Take 20 mg by mouth at bedtime. What changed: Another medication with the same name was removed. Continue taking this medication, and follow the directions you see here.   carteolol 1 % ophthalmic solution Commonly known as: OCUPRESS Place 1 drop into both eyes 2 (two) times daily. 0900/2100 What changed: how much to take   carvedilol 6.25 MG tablet Commonly known as: COREG Take 1 tablet (6.25 mg total) by mouth 2 (two) times daily with a meal.   furosemide 20 MG tablet Commonly known as: Lasix Take 1 tablet (20 mg total) by mouth daily. Start taking on: June 25, 2024   lamoTRIgine  100 MG tablet Commonly known as: LAMICTAL  Take 100 mg by mouth at bedtime. What changed: Another medication with the same name was removed. Continue taking this medication, and follow the directions you see here.   lamoTRIgine  100 MG tablet Commonly known as: LAMICTAL  Take 100 mg by mouth in the morning. What changed: Another medication with the same name was removed. Continue taking this medication, and follow the directions you see here.   lamoTRIgine  25 MG tablet Commonly known as: LAMICTAL  Take 25 mg by mouth at bedtime. What changed: Another medication with the same  name was removed. Continue taking this medication, and follow the directions you see here.   latanoprost  0.005 % ophthalmic solution Commonly known as: XALATAN  Place 1 drop into both eyes at bedtime.   losartan  100 MG tablet Commonly known as: COZAAR  Take 1 tablet (100 mg total) by mouth daily. Start taking on: June 24, 2024 What changed:  medication strength how much to take   pantoprazole  20 MG tablet Commonly known as: Protonix  Take 1 tablet (20 mg total) by mouth daily.   potassium chloride  10 MEQ tablet Commonly known as: KLOR-CON  M Take 1 tablet (10 mEq total) by mouth daily. Start taking on: June 25, 2024   vancomycin  125 MG capsule Commonly known as: VANCOCIN  Take 1 capsule (125 mg total) by mouth 4 (four) times daily for 9 days.   Vitamin D  50 MCG (2000 UT) tablet Take 2,000 Units by mouth daily.               Durable Medical Equipment  (From admission, onward)  Start     Ordered   06/23/24 0921  For home use only DME oxygen  Once       Question Answer Comment  Length of Need Lifetime   Mode or (Route) Nasal cannula   Liters per Minute 3   Frequency Continuous (stationary and portable oxygen unit needed)   Oxygen conserving device Yes   Oxygen delivery system: Gas      06/23/24 0920            Contact information for follow-up providers     Launie Maiden, Ronal Maxwell, NP. Go in 1 week(s).   Specialty: Nurse Practitioner Why: Appointment scheduled with Maxwell Maiden, NP at [facility] on 06/24/24 at 12 PM Contact information: [address] [place] [address] [phone]              Contact information for after-discharge care     Home Medical Care     [facility]) .   Service: Home Health Services Contact information: [address] [place]   [address] [phone]                    Discharge Exam: Fredricka Weights    06/21/24 0500 06/22/24 0500 06/23/24 0500  Weight: 43.7 kg 43.9 kg 44.9 kg   Physical Exam HENT:     Head: Normocephalic.  Eyes:     General: Lids are normal.     Conjunctiva/sclera: Conjunctivae normal.  Cardiovascular:     Rate and Rhythm: Normal rate and regular rhythm.     Heart sounds: Normal heart sounds, S1 normal and S2 normal.  Pulmonary:     Breath sounds: Examination of the right-lower field reveals decreased breath sounds. Examination of the left-lower field reveals decreased breath sounds. Decreased breath sounds present. No wheezing, rhonchi or rales.  Abdominal:     Palpations: Abdomen is soft.     Tenderness: There is no abdominal tenderness.  Musculoskeletal:     Right lower leg: Swelling present.     Left lower leg: Swelling present.  Skin:    General: Skin is warm.  Neurological:     Mental Status: She is alert and oriented to person, place, and time.      Condition at discharge: stable  The results of significant diagnostics from this hospitalization (including imaging, microbiology, ancillary and laboratory) are listed below for reference.   Imaging Studies: DG Knee 1-2 Views Left Result Date: 06/18/2024 CLINICAL DATA:  Knee pain EXAM: LEFT KNEE - 1-2 VIEW COMPARISON:  None Available. FINDINGS: No acute displaced fracture or malalignment. Vascular calcifications. Moderate medial and lateral joint space degenerative change. Small knee effusion. IMPRESSION: Moderate degenerative changes with small effusion. Electronically Signed   By: Luke Bun M.D.   On: 06/18/2024 19:22   DG Chest 1 View Result Date: 06/17/2024 EXAM: 1 VIEW(S) XRAY OF THE CHEST 06/17/2024 06:45:00 AM COMPARISON: 06/16/2024 CLINICAL HISTORY: Swelling 13689 FINDINGS: LUNGS AND PLEURA: Low lung volumes. Bilateral interstitial prominence. Patchy left lung base opacity, new from previous exam. Possible small left pleural effusion. No pulmonary edema. No pneumothorax. HEART AND MEDIASTINUM:  Aortic atherosclerosis. No acute abnormality of the cardiac and mediastinal silhouettes. BONES AND SOFT TISSUES: Degenerative changes in shoulders. Scoliosis. IMPRESSION: 1. Patchy left lung base opacity, new from the prior exam, with possible small left pleural effusion. Electronically signed by: Waddell Calk MD 06/17/2024 07:18 AM EST RP Workstation: [id number]   CT ABDOMEN PELVIS WO CONTRAST  Result Date: 06/16/2024 CLINICAL DATA:  Abdominal pain with loose stool. EXAM: CT ABDOMEN AND PELVIS WITHOUT CONTRAST TECHNIQUE: Multidetector CT imaging of the abdomen and pelvis was performed following the standard protocol without IV contrast. RADIATION DOSE REDUCTION: This exam was performed according to the departmental dose-optimization program which includes automated exposure control, adjustment of the mA and/or kV according to patient size and/or use of iterative reconstruction technique. COMPARISON:  CT abdomen/pelvis 10/14/2013, CT chest 01/28/2021 FINDINGS: Lower chest: Heart is normal size. Calcified plaque over the left main and 3 vessel coronary arteries. Calcified plaque over the descending thoracic aorta. Visualized lung bases demonstrate small amount of bilateral pleural fluid with associated bibasilar atelectasis. Hepatobiliary: Liver, gallbladder and biliary tree are unremarkable. Pancreas: Unremarkable. Spleen: Normal. Adrenals/Urinary Tract: Adrenal glands are normal. Left kidney is normal in size without hydronephrosis or nephrolithiasis. Contrast within the left intrarenal collecting system and ureter as well as the bladder from patient's recent chest CT. Moderate right-sided hydroureteronephrosis with extreme right renal cortical thinning. No contrast within the right intrarenal collecting system as these findings are unchanged from 2022. Stomach/Bowel: Stomach is normal. Small bowel is unremarkable. There is diverticulosis of the colon most prominent over the sigmoid colon. Air and fluid  throughout the colon. Appendix is normal. Vascular/Lymphatic: Moderate calcified plaque over the abdominal aorta which is normal caliber. No significant adenopathy. Reproductive: Uterus and bilateral adnexa are unremarkable. Other: No significant free fluid or focal inflammatory change. No free peritoneal air. Musculoskeletal: Left total hip arthroplasty intact. Moderate biphasic curvature of the thoracolumbar spine. Moderate L1 compression fracture unchanged. IMPRESSION: 1. No acute findings in the abdomen/pelvis. 2. Colonic diverticulosis without evidence of acute diverticulitis. 3. Chronic moderate right-sided hydroureteronephrosis with extreme right renal cortical thinning unchanged from 2022. 4. Small amount of bilateral pleural fluid with associated bibasilar atelectasis. 5. Aortic atherosclerosis. Atherosclerotic coronary artery disease. 6. Stable moderate L1 compression fracture. Aortic Atherosclerosis (ICD10-I70.0). Electronically Signed   By: Toribio Agreste M.D.   On: 06/16/2024 16:14   US  Venous Img Lower Bilateral (DVT) Result Date: 06/16/2024 CLINICAL DATA:  13689 Swelling 13689.  Leg swelling x1 week. EXAM: BILATERAL LOWER EXTREMITY VENOUS DOPPLER ULTRASOUND TECHNIQUE: Gray-scale sonography with graded compression, as well as color Doppler and duplex ultrasound were performed to evaluate the lower extremity deep venous systems from the level of the common femoral vein and including the common femoral, femoral, profunda femoral, popliteal and calf veins including the posterior tibial, peroneal and gastrocnemius veins when visible. The superficial great saphenous vein was also interrogated. Spectral Doppler was utilized to evaluate flow at rest and with distal augmentation maneuvers in the common femoral, femoral and popliteal veins. COMPARISON:  CTA PE, concurrent. BILATERAL lower extremity venous duplex, 09/07/2018. FINDINGS: RIGHT LOWER EXTREMITY VENOUS Normal compressibility of the RIGHT common  femoral, superficial femoral, and popliteal veins, as well as the visualized calf veins. Visualized portions of profunda femoral vein and great saphenous vein unremarkable. No filling defects to suggest DVT on grayscale or color Doppler imaging. Doppler waveforms show normal direction of venous flow, normal respiratory plasticity and response to augmentation. OTHER No evidence of superficial thrombophlebitis or abnormal fluid collection. Limitations: none LEFT LOWER EXTREMITY VENOUS Normal compressibility of the LEFT common femoral, superficial femoral, and popliteal veins, as well as the visualized calf veins. Visualized portions of profunda femoral vein and great saphenous vein unremarkable. No filling defects to suggest DVT on grayscale or color Doppler imaging. Doppler waveforms show normal direction of venous flow, normal respiratory plasticity and response  to augmentation. OTHER No evidence of superficial thrombophlebitis. At the posterior LEFT knee within the fossa is a well-circumscribed anechoic and avascular fluid collection measuring approximately 5.1 x 1.4 x 3.3 cm, consistent with a popliteal fossa/Baker cyst. Limitations: none IMPRESSION: 1. No evidence of femoropopliteal DVT or superficial thrombophlebitis within either lower extremity. 2. 5 cm LEFT popliteal fossa/Baker cyst Thom Hall, MD [facility] Electronically Signed   By: Thom Hall M.D.   On: 06/16/2024 12:16   CT Angio Chest PE W/Cm &/Or Wo Cm Result Date: 06/16/2024 EXAM: CTA of the Chest with contrast for PE 06/16/2024 05:47:09 AM TECHNIQUE: CTA of the chest was performed without and with the administration of 75 mL of iohexol  (OMNIPAQUE ) 350 MG/ML injection. Multiplanar reformatted images are provided for review. MIP images are provided for review. Automated exposure control, iterative reconstruction, and/or weight based adjustment of the mA/kV was utilized to reduce the  radiation dose to as low as reasonably achievable. COMPARISON: 01/28/2021 CLINICAL HISTORY: Pulmonary embolism (PE) suspected, low to intermediate prob, positive D-dimer. FINDINGS: PULMONARY ARTERIES: Pulmonary arteries are adequately opacified for evaluation. No pulmonary embolism. Main pulmonary artery is normal in caliber. MEDIASTINUM: Mild cardiac enlargement. Coronary artery calcifications. No pericardial effusion with mild interstitial edema . Aortic atherosclerosis. No acute abnormality of the thoracic aorta. LYMPH NODES: Right hilar lymph node measures 1.3 cm, image 112/5. Right paratracheal lymph node measures 1.5 cm, image 82/5. No axillary lymphadenopathy. LUNGS AND PLEURA: Diffuse bronchial wall thickening identified. No consolidative change. Small bilateral pleural effusions. No pneumothorax. UPPER ABDOMEN: Limited images of the upper abdomen are unremarkable. SOFT TISSUES AND BONES: The bones appear diffusely osteopenic. Thoracic curvature with convexity towards the right. Multilevel degenerative disc disease within the thoracic spine. No acute soft tissue abnormality. IMPRESSION: 1. No pulmonary embolism. 2. Small bilateral pleural effusions with mild interstitial edema concerning for chf. . 3. Right hilar and right paratracheal lymphadenopathy. This is nonspecific and may be seen in the setting of chf. Consider follow-up imaging with repeat CT of the chest in 3 months to ensure resolution. 4. Cardiac enlargement, aortic atherosclerosis, and coronary artery calcifications. Electronically signed by: Waddell Calk MD 06/16/2024 07:12 AM EST RP Workstation: [id number]   DG Chest Port 1 View Result Date: 06/16/2024 EXAM: 1 VIEW(S) XRAY OF THE CHEST 06/16/2024 04:06:03 AM COMPARISON: 02/18/2024 CLINICAL HISTORY: shob FINDINGS: LUNGS AND PLEURA: Patchy airspace opacity in right mid lung. Chronic coarsened interstitial markings. Unchanged calcified granuloma in left upper lobe. No pulmonary edema. No  pleural effusion. No pneumothorax. HEART AND MEDIASTINUM: Mildly enlarged cardiomediastinal silhouette. Tortuous descending thoracic aorta. Atherosclerotic calcifications. BONES AND SOFT TISSUES: Degenerative changes of shoulders. No acute osseous abnormality. IMPRESSION: 1. Patchy right mid-lung airspace opacity, suspicious for infection. 2. Chronic interstitial lung changes, aortic tortuosity and mild cardiomegaly. Electronically signed by: Helayne Hurst MD 06/16/2024 04:44 AM EST RP Workstation: [id number]    Microbiology: Results for orders placed or performed during the hospital encounter of 06/16/24  Resp panel by RT-PCR (RSV, Flu A&B, Covid) Anterior Nasal Swab     Status: None   Collection Time: 06/16/24  3:52 AM   Specimen: Anterior Nasal Swab  Result Value Ref Range Status   SARS Coronavirus 2 by RT PCR NEGATIVE NEGATIVE Final    Comment: (NOTE) SARS-CoV-2 target nucleic acids are NOT DETECTED.  The SARS-CoV-2 RNA is generally detectable in upper respiratory specimens during the acute phase of infection. The lowest concentration of SARS-CoV-2 viral copies this assay can detect  is 138 copies/mL. A negative result does not preclude SARS-Cov-2 infection and should not be used as the sole basis for treatment or other patient management decisions. A negative result may occur with  improper specimen collection/handling, submission of specimen other than nasopharyngeal swab, presence of viral mutation(s) within the areas targeted by this assay, and inadequate number of viral copies(<138 copies/mL). A negative result must be combined with clinical observations, patient history, and epidemiological information. The expected result is Negative.  Fact Sheet for Patients:  bloggercourse.com  Fact Sheet for Healthcare Providers:  seriousbroker.it  This test is no t yet approved or cleared by the [place]  FDA and  has been authorized  for detection and/or diagnosis of SARS-CoV-2 by FDA under an Emergency Use Authorization (EUA). This EUA will remain  in effect (meaning this test can be used) for the duration of the COVID-19 declaration under Section 564(b)(1) of the Act, 21 U.S.C.section 360bbb-3(b)(1), unless the authorization is terminated  or revoked sooner.       Influenza A by PCR NEGATIVE NEGATIVE Final   Influenza B by PCR NEGATIVE NEGATIVE Final    Comment: (NOTE) The Xpert Xpress SARS-CoV-2/FLU/RSV plus assay is intended as an aid in the diagnosis of influenza from Nasopharyngeal swab specimens and should not be used as a sole basis for treatment. Nasal washings and aspirates are unacceptable for Xpert Xpress SARS-CoV-2/FLU/RSV testing.  Fact Sheet for Patients: bloggercourse.com  Fact Sheet for Healthcare Providers: seriousbroker.it  This test is not yet approved or cleared by the [place]  FDA and has been authorized for detection and/or diagnosis of SARS-CoV-2 by FDA under an Emergency Use Authorization (EUA). This EUA will remain in effect (meaning this test can be used) for the duration of the COVID-19 declaration under Section 564(b)(1) of the Act, 21 U.S.C. section 360bbb-3(b)(1), unless the authorization is terminated or revoked.     Resp Syncytial Virus by PCR NEGATIVE NEGATIVE Final    Comment: (NOTE) Fact Sheet for Patients: bloggercourse.com  Fact Sheet for Healthcare Providers: seriousbroker.it  This test is not yet approved or cleared by the [place]  FDA and has been authorized for detection and/or diagnosis of SARS-CoV-2 by FDA under an Emergency Use Authorization (EUA). This EUA will remain in effect (meaning this test can be used) for the duration of the COVID-19 declaration under Section 564(b)(1) of the Act, 21 U.S.C. section 360bbb-3(b)(1), unless the authorization is  terminated or revoked.  Performed at [facility], [address] [place], [place] [address]   C Difficile Quick Screen w PCR reflex     Status: Abnormal   Collection Time: 06/17/24 12:32 PM   Specimen: STOOL  Result Value Ref Range Status   C Diff antigen POSITIVE (A) NEGATIVE Final   C Diff toxin NEGATIVE NEGATIVE Final   C Diff interpretation Results are indeterminate. See PCR results.  Final    Comment: Performed at [facility], [address] [place], [place] [address]  Gastrointestinal Panel by PCR , Stool     Status: None   Collection Time: 06/17/24 12:32 PM   Specimen: STOOL  Result Value Ref Range Status   Campylobacter species NOT DETECTED NOT DETECTED Final   Plesimonas shigelloides NOT DETECTED NOT DETECTED Final   Salmonella species NOT DETECTED NOT DETECTED Final   Yersinia enterocolitica NOT DETECTED NOT DETECTED Final   Vibrio species NOT DETECTED NOT DETECTED Final   Vibrio cholerae NOT DETECTED NOT DETECTED Final   Enteroaggregative E coli (EAEC) NOT DETECTED  NOT DETECTED Final   Enteropathogenic E coli (EPEC) NOT DETECTED NOT DETECTED Final   Enterotoxigenic E coli (ETEC) NOT DETECTED NOT DETECTED Final   Shiga like toxin producing E coli (STEC) NOT DETECTED NOT DETECTED Final   Shigella/Enteroinvasive E coli (EIEC) NOT DETECTED NOT DETECTED Final   Cryptosporidium NOT DETECTED NOT DETECTED Final   Cyclospora cayetanensis NOT DETECTED NOT DETECTED Final   Entamoeba histolytica NOT DETECTED NOT DETECTED Final   Giardia lamblia NOT DETECTED NOT DETECTED Final   Adenovirus F40/41 NOT DETECTED NOT DETECTED Final   Astrovirus NOT DETECTED NOT DETECTED Final   Norovirus GI/GII NOT DETECTED NOT DETECTED Final   Rotavirus A NOT DETECTED NOT DETECTED Final   Sapovirus (I, II, IV, and V) NOT DETECTED NOT DETECTED Final    Comment: Performed at [facility], [address] [place], [place] [address]  C. Diff by PCR, Reflexed     Status:  Abnormal   Collection Time: 06/17/24 12:32 PM  Result Value Ref Range Status   Toxigenic C. Difficile by PCR POSITIVE (A) NEGATIVE Final    Comment: Positive for toxigenic C. difficile with little to no toxin production. Only treat if clinical presentation suggests symptomatic illness.   Hypervirulent Strain PRESUMPTIVE NEGATIVE PRESUMPTIVE NEGATIVE Final    Comment: Performed at [facility], [address] [place], [place] [address]    Labs: CBC: Recent Labs  Lab 06/21/24 0553  WBC 12.6*  HGB 11.5*  HCT 35.7*  MCV 89.3  PLT 237   Basic Metabolic Panel: Recent Labs  Lab 06/17/24 0549 06/18/24 0430 06/19/24 0447 06/20/24 0843 06/21/24 0553 06/22/24 0342 06/23/24 0554  NA 139 137 138 135 134* 135 135  K 2.5* 3.9 3.9 4.0 3.8 3.8 4.0  CL 104 105 102 95* 98 102 99  CO2 22 24 26 26 24  21* 24  GLUCOSE 82 83 76 97 106* 85 94  BUN 14 12 15 14 21 17 20   CREATININE 0.90 0.92 0.85 1.02* 1.00 0.95 1.18*  CALCIUM  7.6* 7.6* 7.4* 8.8* 9.8 9.2 9.7  MG 1.6* 2.3  --   --   --   --   --   PHOS  --  2.1* 3.5  --   --   --   --    Liver Function Tests: No results for input(s): AST, ALT, ALKPHOS, BILITOT, PROT, ALBUMIN in the last 168 hours. CBG: No results for input(s): GLUCAP in the last 168 hours.  Discharge time spent: greater than 30 minutes.  Signed: [name], MD [facility] 06/23/2024

## 2024-06-23 NOTE — TOC Transition Note (Signed)
 Transition of Care [facility]) - Discharge Note   Patient Details  Name: Kellie Harris MRN: [id number] Date of Birth: 1931-07-16  Transition of Care [facility]) CM/SW Contact:  Daved JONETTA Hamilton, RN Phone Number: 06/23/2024, 1:41 PM   Clinical Narrative:     Patient will DC to: [facility] Anticipated DC date: 06/23/2024 Family notified: [name] Derick Transport by: Fredick to provide transport  Per MD patient ready for DC to [facility]. RN, patient, patient's family, and facility notified of DC. FL2 sent to facility. DC packet on chart.  TOC signing off.   Final next level of care: Assisted Living Barriers to Discharge: Barriers Resolved   Patient Goals and CMS Choice            Discharge Placement                  Name of family member notified: [name] Magallon Patient and family notified of of transfer: 06/23/24  Discharge Plan and Services Additional resources added to the After Visit Summary for       Post Acute Care Choice: Home Health                               Social Drivers of Health (SDOH) Interventions SDOH Screenings   Food Insecurity: No Food Insecurity (06/16/2024)  Housing: Low Risk  (06/16/2024)  Transportation Needs: No Transportation Needs (06/16/2024)  Utilities: Not At Risk (06/16/2024)  Financial Resource Strain: Low Risk  (07/17/2023)   Received from [facility]  Physical Activity: Unknown (11/04/2017)  Social Connections: Socially Isolated (06/16/2024)  Stress: No Stress Concern Present (11/04/2017)  Tobacco Use: Low Risk  (06/16/2024)     Readmission Risk Interventions     No data to display

## 2024-06-27 DIAGNOSIS — E7800 Pure hypercholesterolemia, unspecified: Secondary | ICD-10-CM | POA: Diagnosis not present

## 2024-06-27 DIAGNOSIS — N1831 Chronic kidney disease, stage 3a: Secondary | ICD-10-CM | POA: Diagnosis not present

## 2024-06-27 DIAGNOSIS — Z96642 Presence of left artificial hip joint: Secondary | ICD-10-CM | POA: Diagnosis not present

## 2024-06-27 DIAGNOSIS — I130 Hypertensive heart and chronic kidney disease with heart failure and stage 1 through stage 4 chronic kidney disease, or unspecified chronic kidney disease: Secondary | ICD-10-CM | POA: Diagnosis not present

## 2024-06-27 DIAGNOSIS — K589 Irritable bowel syndrome without diarrhea: Secondary | ICD-10-CM | POA: Diagnosis not present

## 2024-06-27 DIAGNOSIS — M19012 Primary osteoarthritis, left shoulder: Secondary | ICD-10-CM | POA: Diagnosis not present

## 2024-06-27 DIAGNOSIS — I5033 Acute on chronic diastolic (congestive) heart failure: Secondary | ICD-10-CM | POA: Diagnosis not present

## 2024-06-27 DIAGNOSIS — M51369 Other intervertebral disc degeneration, lumbar region without mention of lumbar back pain or lower extremity pain: Secondary | ICD-10-CM | POA: Diagnosis not present

## 2024-06-27 DIAGNOSIS — J9601 Acute respiratory failure with hypoxia: Secondary | ICD-10-CM | POA: Diagnosis not present

## 2024-06-27 DIAGNOSIS — Z7982 Long term (current) use of aspirin: Secondary | ICD-10-CM | POA: Diagnosis not present

## 2024-06-27 DIAGNOSIS — I2510 Atherosclerotic heart disease of native coronary artery without angina pectoris: Secondary | ICD-10-CM | POA: Diagnosis not present

## 2024-06-27 DIAGNOSIS — J181 Lobar pneumonia, unspecified organism: Secondary | ICD-10-CM | POA: Diagnosis not present

## 2024-06-27 DIAGNOSIS — Z85828 Personal history of other malignant neoplasm of skin: Secondary | ICD-10-CM | POA: Diagnosis not present

## 2024-06-27 DIAGNOSIS — Z9981 Dependence on supplemental oxygen: Secondary | ICD-10-CM | POA: Diagnosis not present

## 2024-06-27 DIAGNOSIS — Z96651 Presence of right artificial knee joint: Secondary | ICD-10-CM | POA: Diagnosis not present

## 2024-06-27 DIAGNOSIS — Z8673 Personal history of transient ischemic attack (TIA), and cerebral infarction without residual deficits: Secondary | ICD-10-CM | POA: Diagnosis not present

## 2024-06-27 DIAGNOSIS — Z9181 History of falling: Secondary | ICD-10-CM | POA: Diagnosis not present

## 2024-06-27 DIAGNOSIS — N83209 Unspecified ovarian cyst, unspecified side: Secondary | ICD-10-CM | POA: Diagnosis not present

## 2024-06-27 DIAGNOSIS — J9811 Atelectasis: Secondary | ICD-10-CM | POA: Diagnosis not present

## 2024-06-27 DIAGNOSIS — K5730 Diverticulosis of large intestine without perforation or abscess without bleeding: Secondary | ICD-10-CM | POA: Diagnosis not present

## 2024-07-08 DIAGNOSIS — I5033 Acute on chronic diastolic (congestive) heart failure: Secondary | ICD-10-CM | POA: Diagnosis not present

## 2024-07-08 DIAGNOSIS — I130 Hypertensive heart and chronic kidney disease with heart failure and stage 1 through stage 4 chronic kidney disease, or unspecified chronic kidney disease: Secondary | ICD-10-CM | POA: Diagnosis not present

## 2024-07-08 DIAGNOSIS — J181 Lobar pneumonia, unspecified organism: Secondary | ICD-10-CM | POA: Diagnosis not present

## 2024-07-08 DIAGNOSIS — N1831 Chronic kidney disease, stage 3a: Secondary | ICD-10-CM | POA: Diagnosis not present

## 2024-07-08 DIAGNOSIS — J9601 Acute respiratory failure with hypoxia: Secondary | ICD-10-CM | POA: Diagnosis not present

## 2024-07-08 DIAGNOSIS — K589 Irritable bowel syndrome without diarrhea: Secondary | ICD-10-CM | POA: Diagnosis not present

## 2024-07-08 DIAGNOSIS — E7800 Pure hypercholesterolemia, unspecified: Secondary | ICD-10-CM | POA: Diagnosis not present

## 2024-07-16 DIAGNOSIS — R197 Diarrhea, unspecified: Secondary | ICD-10-CM | POA: Diagnosis not present

## 2024-07-16 DIAGNOSIS — E785 Hyperlipidemia, unspecified: Secondary | ICD-10-CM | POA: Diagnosis not present

## 2024-07-16 DIAGNOSIS — K219 Gastro-esophageal reflux disease without esophagitis: Secondary | ICD-10-CM | POA: Diagnosis not present

## 2024-07-16 DIAGNOSIS — I2510 Atherosclerotic heart disease of native coronary artery without angina pectoris: Secondary | ICD-10-CM | POA: Diagnosis not present

## 2024-07-16 DIAGNOSIS — A0472 Enterocolitis due to Clostridium difficile, not specified as recurrent: Secondary | ICD-10-CM | POA: Diagnosis not present

## 2024-07-23 DIAGNOSIS — R0902 Hypoxemia: Secondary | ICD-10-CM | POA: Diagnosis not present

## 2024-07-30 ENCOUNTER — Emergency Department
Admission: EM | Admit: 2024-07-30 | Discharge: 2024-07-30 | Disposition: A | Discharge status: Home / Self Care | Attending: Emergency Medicine | Admitting: Emergency Medicine

## 2024-07-30 ENCOUNTER — Other Ambulatory Visit: Payer: Self-pay

## 2024-07-30 ENCOUNTER — Emergency Department

## 2024-07-30 DIAGNOSIS — I110 Hypertensive heart disease with heart failure: Secondary | ICD-10-CM | POA: Insufficient documentation

## 2024-07-30 DIAGNOSIS — S0990XA Unspecified injury of head, initial encounter: Secondary | ICD-10-CM | POA: Insufficient documentation

## 2024-07-30 DIAGNOSIS — W19XXXA Unspecified fall, initial encounter: Secondary | ICD-10-CM

## 2024-07-30 DIAGNOSIS — W01198A Fall on same level from slipping, tripping and stumbling with subsequent striking against other object, initial encounter: Secondary | ICD-10-CM | POA: Diagnosis not present

## 2024-07-30 DIAGNOSIS — I5089 Other heart failure: Secondary | ICD-10-CM | POA: Diagnosis not present

## 2024-07-30 LAB — CBC WITH DIFFERENTIAL/PLATELET
Abs Immature Granulocytes: 0.06 K/uL (ref 0.00–0.07)
Basophils Absolute: 0.1 K/uL (ref 0.0–0.1)
Basophils Relative: 0 %
Eosinophils Absolute: 0.4 K/uL (ref 0.0–0.5)
Eosinophils Relative: 4 %
HCT: 35.9 % — ABNORMAL LOW (ref 36.0–46.0)
Hemoglobin: 11.4 g/dL — ABNORMAL LOW (ref 12.0–15.0)
Immature Granulocytes: 1 %
Lymphocytes Relative: 8 %
Lymphs Abs: 0.9 K/uL (ref 0.7–4.0)
MCH: 29 pg (ref 26.0–34.0)
MCHC: 31.8 g/dL (ref 30.0–36.0)
MCV: 91.3 fL (ref 80.0–100.0)
Monocytes Absolute: 0.9 K/uL (ref 0.1–1.0)
Monocytes Relative: 7 %
Neutro Abs: 9.3 K/uL — ABNORMAL HIGH (ref 1.7–7.7)
Neutrophils Relative %: 80 %
Platelets: 163 K/uL (ref 150–400)
RBC: 3.93 MIL/uL (ref 3.87–5.11)
RDW: 16.2 % — ABNORMAL HIGH (ref 11.5–15.5)
WBC: 11.6 K/uL — ABNORMAL HIGH (ref 4.0–10.5)
nRBC: 0 % (ref 0.0–0.2)

## 2024-07-30 LAB — BASIC METABOLIC PANEL WITH GFR
Anion gap: 15 (ref 5–15)
BUN: 14 mg/dL (ref 8–23)
CO2: 25 mmol/L (ref 22–32)
Calcium: 9.2 mg/dL (ref 8.9–10.3)
Chloride: 101 mmol/L (ref 98–111)
Creatinine, Ser: 0.85 mg/dL (ref 0.44–1.00)
GFR, Estimated: >60 mL/min (ref >60)
Glucose, Bld: 95 mg/dL (ref 70–99)
Potassium: 3.6 mmol/L (ref 3.5–5.1)
Sodium: 141 mmol/L (ref 135–145)

## 2024-07-30 MED ORDER — CLONIDINE HCL 0.1 MG PO TABS
0.1000 mg | ORAL_TABLET | Freq: Once | ORAL | Status: AC
  Administered 2024-07-30: 18:00:00 0.1 mg via ORAL
  Filled 2024-07-30: qty 1

## 2024-07-30 MED ORDER — ACETAMINOPHEN 325 MG PO TABS
650.0000 mg | ORAL_TABLET | Freq: Once | ORAL | Status: DC
  Filled 2024-07-30: qty 2

## 2024-07-30 NOTE — Discharge Instructions (Addendum)
 CT head and neck were negative.  Her blood pressure was elevated but I suspect that could be related to pain, stress of being here.  Patient was given a small dose of clonidine  and her kidney function was rechecked and was normal.  She denied any other new symptoms.  After discussion with daughter I feel it is reasonable to send her back to facility make sure she gets her nighttime blood pressure medication and have her blood pressure reevaluated tomorrow by staff there but at this time there does not seem to be anything emergent going on as her CT imaging was negative.  She was ambulatory here without any other pain or discomfort.

## 2024-07-30 NOTE — ED Provider Notes (Signed)
 [facility] Provider Note    Event Date/Time   First MD Initiated Contact with Patient 07/30/24 1623     (approximate)   History   Fall   HPI  Alecea Trego Skarzynski is a 89 y.o. female  significant of HTN, chronic HFpEF, multiple OA's, HTN, HLD, who comes in for a fall.  According to the daughter patient was sitting on her walker when she was trying to reach for a shoe and she slipped and fell back and hit her head.  Patient has hematoma to the back of the head.  She is on aspirin  81.  She denies any pain from the fall she denies any chest pain, shortness of breath, abdominal pain, hip pain, shortness of breath or any other concerns.  She reports being compliant with all of her medications.   Physical Exam   Triage Vital Signs: ED Triage Vitals  Encounter Vitals Group     BP 07/30/24 1331 (!) 191/135     Girls Systolic BP Percentile --      Girls Diastolic BP Percentile --      Boys Systolic BP Percentile --      Boys Diastolic BP Percentile --      Pulse Rate 07/30/24 1331 77     Resp 07/30/24 1331 18     Temp 07/30/24 1331 97.7 F (36.5 C)     Temp Source 07/30/24 1716 Oral     SpO2 07/30/24 1331 97 %     Weight 07/30/24 1331 110 lb (49.9 kg)     Height 07/30/24 1331 5' 4 (1.626 m)     Head Circumference --      Peak Flow --      Pain Score 07/30/24 1331 0     Pain Loc --      Pain Education --      Exclude from Growth Chart --     Most recent vital signs: Vitals:   07/30/24 1331 07/30/24 1716  BP: (!) 191/135 (!) 215/100  Pulse: 77 77  Resp: 18 19  Temp: 97.7 F (36.5 C) 97.9 F (36.6 C)  SpO2: 97% 96%     General: Awake, no distress.  CV:  Good peripheral perfusion.  Nontender.  No bruising noted  resp:  Normal effort.  Clear lungs Abd:  No distention.  Soft and nontender no bruising noted Other:  No CTL spine tenderness no bruising of the back noted.  No pain of the hips.  Able to lift both legs up off the bed.  Able to lift both arms  off the bed slightly limited secondary to known baseline degenerative issues in her shoulders but she denies any new pain or issues with mobility.  Mild edema noted legs but family report is baseline and much improved Hematoma to the right side of the head.  No overlying abrasion.  ED Results / Procedures / Treatments   Labs (all labs ordered are listed, but only abnormal results are displayed) Labs Reviewed  CBC WITH DIFFERENTIAL/PLATELET - Abnormal; Notable for the following components:      Result Value   WBC 11.6 (*)    Hemoglobin 11.4 (*)    HCT 35.9 (*)    RDW 16.2 (*)    Neutro Abs 9.3 (*)    All other components within normal limits  BASIC METABOLIC PANEL WITH GFR     RADIOLOGY I have reviewed the ct  personally and interpreted no intracranial hemorrhage   PROCEDURES:  Critical Care performed: No  Procedures   MEDICATIONS ORDERED IN ED: Medications  cloNIDine  (CATAPRES ) tablet 0.1 mg (has no administration in time range)     IMPRESSION / MDM / ASSESSMENT AND PLAN / ED COURSE  I reviewed the triage vital signs and the nursing notes.   Patient's presentation is most consistent with acute presentation with potential threat to life or bodily function.  Patient comes in with what sounds like mechanical fall.  Family also report that it was mechanical in nature she denies any chest pain, shortness of breath.  CT imaging ordered evaluate for intercranial hemorrhage, cervical fracture.  Patient noted to have asymptomatic hypertension.  Patient is on multiple blood pressure medications.  She was recently admitted on review of records and discharged on 11/11 secondary to CHF but they deny any concerns for worsening shortness of breath.  She does not looks fluid overloaded on examination.  Discussed chest x-ray but they deny any shortness of breath.  Discussed pelvic x-ray but she denies any hip pain and patient is ambulatory without any pain.  Patient is on blood pressure  medication.  Daughter does report that maybe she is just anxious about being here.  They deny any new other concerns.  Patient will be given a dose of clonidine  and we will check some blood work.  BMP is reassuring with creatinine that is at baseline.  CBC shows white count but is downtrending from recent admission and hemoglobin is stable.  Repeat evaluation she remains without any significant symptoms her blood pressures have slightly improved.  She is at a facility where they can recheck her blood pressure and follow this up with the facility staff there and they expressed understanding and felt comfortable with discharge home.  No abrasions from fall so no indication for tdap update.    FINAL CLINICAL IMPRESSION(S) / ED DIAGNOSES   Final diagnoses:  Fall, initial encounter  Injury of head, initial encounter     Rx / DC Orders   ED Discharge Orders     None        Note:  This document was prepared using Dragon voice recognition software and may include unintentional dictation errors.   Ernest Ronal BRAVO, MD 07/30/24 TYRA

## 2024-07-30 NOTE — ED Triage Notes (Signed)
 Pt to ED POV with daughter. Lives at [facility]. Was sitting on walker reaching for shoes and slipped, hit head. Denies LOC. Takes 81mg  asa. Denies pain from fall. HTN in triage, took meds this am.   Daughter has DNR at bedside

## 2024-08-11 ENCOUNTER — Other Ambulatory Visit: Payer: Self-pay

## 2024-08-17 ENCOUNTER — Emergency Department

## 2024-08-17 ENCOUNTER — Other Ambulatory Visit (HOSPITAL_COMMUNITY): Payer: Self-pay

## 2024-08-17 ENCOUNTER — Observation Stay
Admission: EM | Admit: 2024-08-17 | Discharge: 2024-08-24 | Disposition: A | Attending: Emergency Medicine | Admitting: Emergency Medicine

## 2024-08-17 DIAGNOSIS — N182 Chronic kidney disease, stage 2 (mild): Secondary | ICD-10-CM | POA: Diagnosis not present

## 2024-08-17 DIAGNOSIS — N179 Acute kidney failure, unspecified: Secondary | ICD-10-CM | POA: Diagnosis not present

## 2024-08-17 DIAGNOSIS — I130 Hypertensive heart and chronic kidney disease with heart failure and stage 1 through stage 4 chronic kidney disease, or unspecified chronic kidney disease: Secondary | ICD-10-CM | POA: Diagnosis not present

## 2024-08-17 DIAGNOSIS — Z96651 Presence of right artificial knee joint: Secondary | ICD-10-CM | POA: Insufficient documentation

## 2024-08-17 DIAGNOSIS — Z8669 Personal history of other diseases of the nervous system and sense organs: Secondary | ICD-10-CM | POA: Insufficient documentation

## 2024-08-17 DIAGNOSIS — Z85828 Personal history of other malignant neoplasm of skin: Secondary | ICD-10-CM | POA: Insufficient documentation

## 2024-08-17 DIAGNOSIS — E43 Unspecified severe protein-calorie malnutrition: Secondary | ICD-10-CM | POA: Insufficient documentation

## 2024-08-17 DIAGNOSIS — J9601 Acute respiratory failure with hypoxia: Secondary | ICD-10-CM | POA: Insufficient documentation

## 2024-08-17 DIAGNOSIS — G9341 Metabolic encephalopathy: Secondary | ICD-10-CM | POA: Insufficient documentation

## 2024-08-17 DIAGNOSIS — R4182 Altered mental status, unspecified: Principal | ICD-10-CM | POA: Insufficient documentation

## 2024-08-17 DIAGNOSIS — K222 Esophageal obstruction: Secondary | ICD-10-CM | POA: Insufficient documentation

## 2024-08-17 DIAGNOSIS — Z7982 Long term (current) use of aspirin: Secondary | ICD-10-CM | POA: Insufficient documentation

## 2024-08-17 DIAGNOSIS — Z96642 Presence of left artificial hip joint: Secondary | ICD-10-CM | POA: Insufficient documentation

## 2024-08-17 DIAGNOSIS — Z79899 Other long term (current) drug therapy: Secondary | ICD-10-CM | POA: Diagnosis not present

## 2024-08-17 DIAGNOSIS — G40909 Epilepsy, unspecified, not intractable, without status epilepticus: Secondary | ICD-10-CM

## 2024-08-17 DIAGNOSIS — F0390 Unspecified dementia without behavioral disturbance: Secondary | ICD-10-CM | POA: Diagnosis not present

## 2024-08-17 DIAGNOSIS — Z8673 Personal history of transient ischemic attack (TIA), and cerebral infarction without residual deficits: Secondary | ICD-10-CM | POA: Diagnosis not present

## 2024-08-17 DIAGNOSIS — J9611 Chronic respiratory failure with hypoxia: Secondary | ICD-10-CM | POA: Insufficient documentation

## 2024-08-17 DIAGNOSIS — N390 Urinary tract infection, site not specified: Principal | ICD-10-CM | POA: Insufficient documentation

## 2024-08-17 DIAGNOSIS — W44F3XA Food entering into or through a natural orifice, initial encounter: Secondary | ICD-10-CM

## 2024-08-17 DIAGNOSIS — Z139 Encounter for screening, unspecified: Secondary | ICD-10-CM

## 2024-08-17 DIAGNOSIS — I5030 Unspecified diastolic (congestive) heart failure: Secondary | ICD-10-CM | POA: Insufficient documentation

## 2024-08-17 DIAGNOSIS — Z8619 Personal history of other infectious and parasitic diseases: Secondary | ICD-10-CM | POA: Diagnosis not present

## 2024-08-17 DIAGNOSIS — I5032 Chronic diastolic (congestive) heart failure: Secondary | ICD-10-CM | POA: Insufficient documentation

## 2024-08-17 DIAGNOSIS — R0602 Shortness of breath: Secondary | ICD-10-CM | POA: Diagnosis present

## 2024-08-17 DIAGNOSIS — T18128A Food in esophagus causing other injury, initial encounter: Secondary | ICD-10-CM | POA: Insufficient documentation

## 2024-08-17 DIAGNOSIS — I10 Essential (primary) hypertension: Secondary | ICD-10-CM | POA: Diagnosis present

## 2024-08-17 LAB — BASIC METABOLIC PANEL WITH GFR
Anion gap: 16 — ABNORMAL HIGH (ref 5–15)
BUN: 15 mg/dL (ref 8–23)
CO2: 22 mmol/L (ref 22–32)
Calcium: 9.6 mg/dL (ref 8.9–10.3)
Chloride: 101 mmol/L (ref 98–111)
Creatinine, Ser: 0.92 mg/dL (ref 0.44–1.00)
GFR, Estimated: 58 mL/min — ABNORMAL LOW
Glucose, Bld: 94 mg/dL (ref 70–99)
Potassium: 3.6 mmol/L (ref 3.5–5.1)
Sodium: 138 mmol/L (ref 135–145)

## 2024-08-17 LAB — CBC WITH DIFFERENTIAL/PLATELET
Abs Immature Granulocytes: 0.04 K/uL (ref 0.00–0.07)
Basophils Absolute: 0.1 K/uL (ref 0.0–0.1)
Basophils Relative: 1 %
Eosinophils Absolute: 0.3 K/uL (ref 0.0–0.5)
Eosinophils Relative: 3 %
HCT: 39.3 % (ref 36.0–46.0)
Hemoglobin: 12.6 g/dL (ref 12.0–15.0)
Immature Granulocytes: 0 %
Lymphocytes Relative: 10 %
Lymphs Abs: 0.9 K/uL (ref 0.7–4.0)
MCH: 29.2 pg (ref 26.0–34.0)
MCHC: 32.1 g/dL (ref 30.0–36.0)
MCV: 91 fL (ref 80.0–100.0)
Monocytes Absolute: 0.7 K/uL (ref 0.1–1.0)
Monocytes Relative: 8 %
Neutro Abs: 7.4 K/uL (ref 1.7–7.7)
Neutrophils Relative %: 78 %
Platelets: 234 K/uL (ref 150–400)
RBC: 4.32 MIL/uL (ref 3.87–5.11)
RDW: 15.3 % (ref 11.5–15.5)
WBC: 9.4 K/uL (ref 4.0–10.5)
nRBC: 0.3 % — ABNORMAL HIGH (ref 0.0–0.2)

## 2024-08-17 NOTE — Discharge Instructions (Addendum)
 Her oxygen saturations are normal.  Please get shorter length of tubing for her supplemental oxygen.  Exceptionally long tubing prevents normal flow and limits the amount of oxygen she is actually getting  [employer] is a care management and advocacy organization that helps individuals and families navigate complex care needs, healthcare options, and community supports. They focus on providing clarity and compassionate guidance through life transitions, especially when facing medical, mental health, or long-term care challenges.   ?? Services Offered  [employer] provides a range of tailored support services, including:   Care coordination & advocacy -- helping manage communication with providers and organize care plans.  Long-term care planning -- explaining care options, costs, and payment possibilities.  Biopsychosocial assessments -- holistic evaluations of medical, psychological, and social needs.  Referrals -- connecting clients with home health, medical specialists, palliative care, and community services.  Community education -- workshops and guidance on resources like Medicaid and other benefit programs.  24/7 support and crisis assistance -- helping families navigate emergencies and ongoing care needs.  Family and caregiver support -- coaching and resources to prevent burnout and support implementation of care plans.    They serve adults over age 9 as well as adults (18+) with intellectual/developmental disabilities or mental health challenges.    ?? Contact Information   Phone / Text: [phone]  Fax: [phone]  [employer]  Email: info@[employer] .com  Website: [email or web address]://www.[employer].com/

## 2024-08-17 NOTE — ED Triage Notes (Signed)
 Pt to ED via [facility] from [facility]. Staff reports that Pt O2 sat was 84% on 3L [place] and could not get it to increase any further. EMS does report that pt was using lengthy oxygen tubing during this time. 94% 3L is pt BL. No complaints from pt. Hx of early onset Alzheimers, high BP. Pt currently 95% on room air during triage.   94 3L [place]  194/100

## 2024-08-17 NOTE — ED Provider Notes (Addendum)
 "  [facility] Provider Note    Event Date/Time   First MD Initiated Contact with Patient 08/17/24 2116     (approximate)   History   Shortness of Breath   HPI  Kellie Harris is a 89 y.o. female who presents to the ED for evaluation of Shortness of Breath   I reviewed medical DC summary from 11/11.  History of diastolic CHF on chronic 3 L.  Patient presents to the ED from local SNF with concerns for low O2 saturations at her SNF.  They did a routine check on her and noted saturations of 84% on her supplemental 3 L so they called EMS.  EMS reports exceptionally long oxygen tubing in her room, such that she could walk down to the cafeteria and back without bringing her tank.  When EMS placed patient on their supplemental 3 L with normal tubing she has had O2 saturations 99-100%.  Patient asymptomatic  Patient arrives to the ED asymptomatic and questions why she is here.  She reports that she feels fine and has no complaints.  Denies chest discomfort, shortness of breath, cough or any concerns.  Past majority of history is later provided by patient's daughter when she arrives to the ED.  Reports that patient has seemed slightly responsive than normal.  She questions a UTI.    Physical Exam   Triage Vital Signs: ED Triage Vitals  Encounter Vitals Group     BP      Girls Systolic BP Percentile      Girls Diastolic BP Percentile      Boys Systolic BP Percentile      Boys Diastolic BP Percentile      Pulse      Resp      Temp      Temp src      SpO2      Weight      Height      Head Circumference      Peak Flow      Pain Score      Pain Loc      Pain Education      Exclude from Growth Chart     Most recent vital signs: Vitals:   08/17/24 2123 08/17/24 2217  BP: (!) 175/122   Pulse:    Resp:    Temp:    SpO2:  99%    General: Awake, no distress.  CV:  Good peripheral perfusion.  Resp:  Normal effort.  Good airflow throughout without  wheezing, no tachypnea Abd:  No distention.  MSK:  No deformity noted.  Neuro:  No focal deficits appreciated. Other:     ED Results / Procedures / Treatments   Labs (all labs ordered are listed, but only abnormal results are displayed) Labs Reviewed  CBC WITH DIFFERENTIAL/PLATELET - Abnormal; Notable for the following components:      Result Value   nRBC 0.3 (*)    All other components within normal limits  BASIC METABOLIC PANEL WITH GFR  URINALYSIS, ROUTINE W REFLEX MICROSCOPIC    EKG   RADIOLOGY   Official radiology report(s): DG Chest Portable 1 View Result Date: 08/17/2024 EXAM: 1 VIEW(S) XRAY OF THE CHEST 08/17/2024 11:05:00 PM COMPARISON: 06/17/2024 CLINICAL HISTORY: hypoxia, eval infiltrate, pulm congestion FINDINGS: LUNGS AND PLEURA: Inspiration. Opacity in the left lung base likely representing atelectasis or infiltration with possible small left pleural effusion. Similar appearance to previous study. Probable emphysematous changes in the lungs. No  pneumothorax. HEART AND MEDIASTINUM: Heart size and pulmonary vascularity are normal. Mediastinal contours appear intact. Tortuous and calcified aorta. BONES AND SOFT TISSUES: Degenerative changes in the spine and shoulders. S-shaped thoracolumbar scoliosis. No acute osseous abnormality. IMPRESSION: 1. Opacity in the left lung base, likely representing atelectasis or infiltration with possible small left pleural effusion, similar to previous study. 2. Probable emphysematous changes in the lungs. Electronically signed by: Elsie Gravely MD 08/17/2024 11:10 PM EST RP Workstation: [id number]    PROCEDURES and INTERVENTIONS:  Procedures  Medications - No data to display   IMPRESSION / MDM / ASSESSMENT AND PLAN / ED COURSE  I reviewed the triage vital signs and the nursing notes.  Differential diagnosis includes, but is not limited to, hardware malfunction, kinked tubing, tubing that is too long, CHF exacerbation, PE,  pneumonia  {Patient presents with symptoms of an acute illness or injury that is potentially life-threatening.  Patient presents to the ED due to facility concerns for hypoxia.  No evidence of this.  She has a normal exam, normal vitals on her baseline 3 L, asymptomatic.   After discussing with daughter we will add some basic labs, urine and chest x-ray.  Normal CBC, CXR with chronic atelectasis to left base, doubt acute pneumonia.  Essentially normal metabolic panel, marginal With normal renal function and electrolytes.  Awaiting UA at the time of signout to oncoming physician.  Anticipate outpatient management  Clinical Course as of 08/17/24 2331  Mon Aug 17, 2024  2229 Daughter to the bedside provides helpful supplemental history, will obtain some basic labs, urine, cxr [DS]    Clinical Course User Index [DS] Claudene Rover, MD     FINAL CLINICAL IMPRESSION(S) / ED DIAGNOSES   Final diagnoses:  Encounter for medical screening examination     Rx / DC Orders   ED Discharge Orders     None        Note:  This document was prepared using Dragon voice recognition software and may include unintentional dictation errors.   Claudene Rover, MD 08/17/24 2120    Claudene Rover, MD 08/17/24 2333  "

## 2024-08-18 DIAGNOSIS — I5032 Chronic diastolic (congestive) heart failure: Secondary | ICD-10-CM

## 2024-08-18 DIAGNOSIS — Z8619 Personal history of other infectious and parasitic diseases: Secondary | ICD-10-CM

## 2024-08-18 DIAGNOSIS — N390 Urinary tract infection, site not specified: Secondary | ICD-10-CM | POA: Diagnosis present

## 2024-08-18 DIAGNOSIS — G9341 Metabolic encephalopathy: Secondary | ICD-10-CM

## 2024-08-18 DIAGNOSIS — R4182 Altered mental status, unspecified: Secondary | ICD-10-CM | POA: Diagnosis not present

## 2024-08-18 DIAGNOSIS — J9611 Chronic respiratory failure with hypoxia: Secondary | ICD-10-CM

## 2024-08-18 LAB — RESPIRATORY PANEL BY PCR

## 2024-08-18 LAB — URINALYSIS, ROUTINE W REFLEX MICROSCOPIC
Bilirubin Urine: NEGATIVE
Glucose, UA: NEGATIVE mg/dL
Ketones, ur: NEGATIVE mg/dL
Nitrite: NEGATIVE
Protein, ur: NEGATIVE mg/dL
Specific Gravity, Urine: 1.017 (ref 1.005–1.030)
WBC, UA: >50 WBC/hpf (ref 0–5)
pH: 5 (ref 5.0–8.0)

## 2024-08-18 LAB — BASIC METABOLIC PANEL WITH GFR
Anion gap: 17 — ABNORMAL HIGH (ref 5–15)
BUN: 16 mg/dL (ref 8–23)
CO2: 21 mmol/L — ABNORMAL LOW (ref 22–32)
Calcium: 9.3 mg/dL (ref 8.9–10.3)
Chloride: 101 mmol/L (ref 98–111)
Creatinine, Ser: 0.97 mg/dL (ref 0.44–1.00)
GFR, Estimated: 55 mL/min — ABNORMAL LOW
Glucose, Bld: 84 mg/dL (ref 70–99)
Potassium: 3.9 mmol/L (ref 3.5–5.1)
Sodium: 139 mmol/L (ref 135–145)

## 2024-08-18 LAB — RESP PANEL BY RT-PCR (RSV, FLU A&B, COVID)  RVPGX2
Influenza A by PCR: NEGATIVE
Influenza B by PCR: NEGATIVE
Resp Syncytial Virus by PCR: NEGATIVE
SARS Coronavirus 2 by RT PCR: NEGATIVE

## 2024-08-18 LAB — MAGNESIUM: Magnesium: 1.9 mg/dL (ref 1.7–2.4)

## 2024-08-18 LAB — PHOSPHORUS: Phosphorus: 3.5 mg/dL (ref 2.5–4.6)

## 2024-08-18 MED ORDER — SODIUM CHLORIDE 0.9 % IV SOLN
1.0000 g | INTRAVENOUS | Status: DC
  Administered 2024-08-18 – 2024-08-19 (×2): 1 g via INTRAVENOUS
  Filled 2024-08-18 (×2): qty 10

## 2024-08-18 MED ORDER — ENOXAPARIN SODIUM 30 MG/0.3ML IJ SOSY
30.0000 mg | PREFILLED_SYRINGE | INTRAMUSCULAR | Status: DC
  Administered 2024-08-19 – 2024-08-23 (×5): 30 mg via SUBCUTANEOUS
  Filled 2024-08-18 (×5): qty 0.3

## 2024-08-18 MED ORDER — LOSARTAN POTASSIUM 50 MG PO TABS
100.0000 mg | ORAL_TABLET | Freq: Every day | ORAL | Status: DC
  Administered 2024-08-18 – 2024-08-20 (×2): 100 mg via ORAL
  Filled 2024-08-18 (×3): qty 2

## 2024-08-18 MED ORDER — ASPIRIN 81 MG PO CHEW
81.0000 mg | CHEWABLE_TABLET | Freq: Every day | ORAL | Status: DC
  Administered 2024-08-18 – 2024-08-23 (×4): 81 mg via ORAL
  Filled 2024-08-18 (×6): qty 1

## 2024-08-18 MED ORDER — LAMOTRIGINE 25 MG PO TABS
125.0000 mg | ORAL_TABLET | Freq: Every day | ORAL | Status: DC
  Administered 2024-08-18 – 2024-08-21 (×4): 125 mg via ORAL
  Filled 2024-08-18 (×5): qty 5

## 2024-08-18 MED ORDER — HYDRALAZINE HCL 20 MG/ML IJ SOLN
10.0000 mg | Freq: Once | INTRAMUSCULAR | Status: AC
  Administered 2024-08-18: 10 mg via INTRAVENOUS
  Filled 2024-08-18: qty 1

## 2024-08-18 MED ORDER — HYDRALAZINE HCL 50 MG PO TABS
50.0000 mg | ORAL_TABLET | Freq: Four times a day (QID) | ORAL | Status: DC | PRN
  Administered 2024-08-18 – 2024-08-19 (×2): 50 mg via ORAL
  Filled 2024-08-18 (×2): qty 1

## 2024-08-18 MED ORDER — CARVEDILOL 6.25 MG PO TABS
6.2500 mg | ORAL_TABLET | Freq: Two times a day (BID) | ORAL | Status: DC
  Administered 2024-08-18 – 2024-08-24 (×13): 6.25 mg via ORAL
  Filled 2024-08-18 (×15): qty 1

## 2024-08-18 MED ORDER — SODIUM CHLORIDE 0.9 % IV SOLN
2.0000 g | Freq: Once | INTRAVENOUS | Status: AC
  Administered 2024-08-18: 2 g via INTRAVENOUS
  Filled 2024-08-18: qty 20

## 2024-08-18 MED ORDER — ENOXAPARIN SODIUM 40 MG/0.4ML IJ SOSY
40.0000 mg | PREFILLED_SYRINGE | INTRAMUSCULAR | Status: DC
  Administered 2024-08-18: 40 mg via SUBCUTANEOUS
  Filled 2024-08-18: qty 0.4

## 2024-08-18 MED ORDER — ALBUTEROL SULFATE (2.5 MG/3ML) 0.083% IN NEBU: 2.5000 mg | INHALATION_SOLUTION | RESPIRATORY_TRACT | Status: DC | PRN

## 2024-08-18 MED ORDER — HYDRALAZINE HCL 50 MG PO TABS: 50.0000 mg | ORAL_TABLET | Freq: Four times a day (QID) | ORAL | Status: DC | PRN

## 2024-08-18 MED ORDER — PANTOPRAZOLE SODIUM 20 MG PO TBEC
20.0000 mg | DELAYED_RELEASE_TABLET | Freq: Every day | ORAL | Status: DC
  Administered 2024-08-18 – 2024-08-24 (×5): 20 mg via ORAL
  Filled 2024-08-18 (×7): qty 1

## 2024-08-18 MED ORDER — ACETAMINOPHEN 650 MG RE SUPP: 650.0000 mg | Freq: Four times a day (QID) | RECTAL | Status: DC | PRN

## 2024-08-18 MED ORDER — ACETAMINOPHEN 325 MG PO TABS: 650.0000 mg | ORAL_TABLET | Freq: Four times a day (QID) | ORAL | Status: DC | PRN

## 2024-08-18 MED ORDER — ONDANSETRON HCL 4 MG/2ML IJ SOLN
4.0000 mg | Freq: Four times a day (QID) | INTRAMUSCULAR | Status: DC | PRN
  Administered 2024-08-19: 4 mg via INTRAVENOUS
  Filled 2024-08-18: qty 2

## 2024-08-18 MED ORDER — LAMOTRIGINE 100 MG PO TABS
100.0000 mg | ORAL_TABLET | Freq: Every morning | ORAL | Status: DC
  Administered 2024-08-18 – 2024-08-24 (×7): 100 mg via ORAL
  Filled 2024-08-18 (×8): qty 1

## 2024-08-18 MED ORDER — ATORVASTATIN CALCIUM 20 MG PO TABS
20.0000 mg | ORAL_TABLET | Freq: Every day | ORAL | Status: DC
  Administered 2024-08-18 – 2024-08-22 (×5): 20 mg via ORAL
  Filled 2024-08-18 (×5): qty 1

## 2024-08-18 MED ORDER — FUROSEMIDE 20 MG PO TABS
20.0000 mg | ORAL_TABLET | Freq: Every day | ORAL | Status: DC
  Administered 2024-08-18: 20 mg via ORAL
  Filled 2024-08-18 (×3): qty 1

## 2024-08-18 MED ORDER — ONDANSETRON HCL 4 MG PO TABS: 4.0000 mg | ORAL_TABLET | Freq: Four times a day (QID) | ORAL | Status: DC | PRN

## 2024-08-18 MED ORDER — TIMOLOL MALEATE 0.5 % OP SOLN
1.0000 [drp] | Freq: Every day | OPHTHALMIC | Status: DC
  Administered 2024-08-18 – 2024-08-24 (×7): 1 [drp] via OPHTHALMIC
  Filled 2024-08-18 (×2): qty 5

## 2024-08-18 NOTE — Assessment & Plan Note (Signed)
 Initial consult on for hypoxia,, however patient has saturating in the high 90s on O2 at 3 L Continue to monitor for increased need

## 2024-08-18 NOTE — Assessment & Plan Note (Signed)
 Rocephin   Follow cultures

## 2024-08-18 NOTE — Assessment & Plan Note (Signed)
 No acute issues Continue home meds pending verification

## 2024-08-18 NOTE — Plan of Care (Signed)

## 2024-08-18 NOTE — H&P (Signed)
 " History and Physical    Patient: Kellie Harris FMW:[id number] DOB: 20-Sep-1930 DOA: 08/17/2024 DOS: the patient was seen and examined on 08/18/2024 PCP: Patient, No Pcp Per  Patient coming from: ALF/ILF  Chief Complaint:  Chief Complaint  Patient presents with   Shortness of Breath    HPI: Kellie Harris is a 89 y.o. female with medical history significant for HTN, dementia, chronic HFpEF, history of C. difficile in November 2025, when she was hospitalized for acute dyspnea, attributed to CHF exacerbation, discharged on O2 at 2 L, being admitted from ALF with a UTI.  She was originally sent to the ED due to concerns for shortness of breath and hypoxia to 84% on her 3 L in the setting of 1 week history of increasing confusion.  O2 sats remained normal in the mid 90s with EMS so they attributed the initial low sats to the length of the O2 tubing at the facility. Upon arrival, remained with sats in the high 90s on 3 L with otherwise normal vitals. Labs showed UA with small leuks and a few bacteria.  CBC and BMP normal albeit with a slightly elevated anion gap of 16.EKG showed sinus at 76 and chest x-ray showed Opacity in the left lung base, likely representing atelectasis or infiltration with possible small left pleural effusion, similar to previous study Started on ceftriaxone  for UTI Respiratory viral panel pending Admission requested     Past Medical History:  Diagnosis Date   Arthritis    right knee, left shoulder/ OSTEO   Cancer (HCC)    skin    Degenerative disc disease, lumbar    L4-L5   Diverticulosis    ITIS   History of hiatal hernia    Hydronephrosis    right   Hypercholesteremia    Hypertension    CONTROLLED ON MEDS   IBS (irritable bowel syndrome)    Knee fracture, right 03/05/2015   DIFFICULTY WITH AMBULATION   Osteopenia    Ovarian cyst    Pancreatic insufficiency    Stroke [facility]) 2025   unsure if TIA or stroke, no deficits   Wears hearing aid    bilateral   Past  Surgical History:  Procedure Laterality Date   CATARACT EXTRACTION W/PHACO Right 11/02/2015   Procedure: CATARACT EXTRACTION PHACO AND INTRAOCULAR LENS PLACEMENT (IOC);  Surgeon: Dene Etienne, MD;  Location: [facility];  Service: Ophthalmology;  Laterality: Right;   CATARACT EXTRACTION W/PHACO Left 07/18/2016   Procedure: CATARACT EXTRACTION PHACO AND INTRAOCULAR LENS PLACEMENT (IOC);  Surgeon: Dene Etienne, MD;  Location: [facility];  Service: Ophthalmology;  Laterality: Left;   COLONOSCOPY     ESOPHAGOGASTRODUODENOSCOPY N/A 02/14/2024   Procedure: EGD (ESOPHAGOGASTRODUODENOSCOPY);  Surgeon: Maryruth Ole DASEN, MD;  Location: [facility] ENDOSCOPY;  Service: Endoscopy;  Laterality: N/A;   FRACTURE SURGERY Left    hip   HARDWARE REMOVAL Left 09/04/2021   Procedure: HARDWARE REMOVAL;  Surgeon: Leora Lynwood SAUNDERS, MD;  Location: [facility] ORS;  Service: Orthopedics;  Laterality: Left;   history of left hip fracture     INTRAMEDULLARY (IM) NAIL INTERTROCHANTERIC Left 02/12/2017   Procedure: INTRAMEDULLARY (IM) NAIL INTERTROCHANTRIC;  Surgeon: Cleotilde Barrio, MD;  Location: [facility] ORS;  Service: Orthopedics;  Laterality: Left;   kidney stent     OOPHORECTOMY Bilateral 2015   ORIF WRIST FRACTURE Right 04/08/2018   Procedure: OPEN REDUCTION INTERNAL FIXATION (ORIF) WRIST FRACTURE;  Surgeon: Celena Sharper, MD;  Location: [facility] OR;  Service: Orthopedics;  Laterality: Right;  TONSILLECTOMY     TOTAL HIP ARTHROPLASTY Left 09/04/2021   Procedure: TOTAL HIP ARTHROPLASTY ANTERIOR APPROACH;  Surgeon: Leora Lynwood SAUNDERS, MD;  Location: [facility] ORS;  Service: Orthopedics;  Laterality: Left;   TOTAL KNEE ARTHROPLASTY Right 08/07/2017   Procedure: TOTAL KNEE ARTHROPLASTY;  Surgeon: Cleotilde Barrio, MD;  Location: [facility] ORS;  Service: Orthopedics;  Laterality: Right;   Social History:  reports that she has never smoked. She has never used smokeless tobacco. She reports that she does not drink alcohol and does  not use drugs.  Allergies[1]  Family History  Problem Relation Age of Onset   Breast cancer Mother 92   Leukemia Mother    Stroke Father     Prior to Admission medications  Medication Sig Start Date End Date Taking? Authorizing Provider  acetaminophen  (TYLENOL ) 500 MG tablet Take 1,000 mg by mouth in the morning and at bedtime. 0900/2100    [provider]  aspirin  81 MG chewable tablet Chew 1 tablet (81 mg total) by mouth daily. 02/23/24   Rojelio Nest, DO  atorvastatin  (LIPITOR) 20 MG tablet Take 20 mg by mouth at bedtime.    [provider]  carteolol  (OCUPRESS ) 1 % ophthalmic solution Place 1 drop into both eyes 2 (two) times daily. 0900/2100 06/23/24   Josette Ade, MD  carvedilol  (COREG ) 6.25 MG tablet Take 1 tablet (6.25 mg total) by mouth 2 (two) times daily with a meal. 06/23/24   Josette Ade, MD  Cholecalciferol  (VITAMIN D ) 50 MCG (2000 UT) tablet Take 2,000 Units by mouth daily.    [provider]  furosemide  (LASIX ) 20 MG tablet Take 1 tablet (20 mg total) by mouth daily. 06/25/24   Josette Ade, MD  lamoTRIgine  (LAMICTAL ) 100 MG tablet Take 100 mg by mouth at bedtime.    [provider]  lamoTRIgine  (LAMICTAL ) 100 MG tablet Take 100 mg by mouth in the morning.    [provider]  lamoTRIgine  (LAMICTAL ) 25 MG tablet Take 25 mg by mouth at bedtime. 05/05/24   [provider]  latanoprost  (XALATAN ) 0.005 % ophthalmic solution Place 1 drop into both eyes at bedtime. 05/21/22   [provider]  losartan  (COZAAR ) 100 MG tablet Take 1 tablet (100 mg total) by mouth daily. 06/24/24   Josette Ade, MD  pantoprazole  (PROTONIX ) 20 MG tablet Take 1 tablet (20 mg total) by mouth daily. 02/14/24 02/13/25  Maryruth Ole DASEN, MD  potassium chloride  (KLOR-CON  M) 10 MEQ tablet Take 1 tablet (10 mEq total) by mouth daily. 06/25/24   Josette Ade, MD    Physical Exam: Vitals:   08/18/24 0030 08/18/24 0031  08/18/24 0140 08/18/24 0230  BP: (!) 164/85   (!) 201/91  Pulse: 70   73  Resp:      Temp:   (!) 97.5 F (36.4 C)   TempSrc:   Axillary   SpO2: 98% 99%  100%  Weight:      Height:       Physical Exam Vitals and nursing note reviewed.  Constitutional:      General: She is not in acute distress.    Comments: Frail-appearing elderly female in no distress  HENT:     Head: Normocephalic and atraumatic.  Cardiovascular:     Rate and Rhythm: Normal rate and regular rhythm.     Heart sounds: Normal heart sounds.  Pulmonary:     Effort: Pulmonary effort is normal.     Breath sounds: Normal breath sounds.  Abdominal:  Palpations: Abdomen is soft.     Tenderness: There is no abdominal tenderness.  Neurological:     Mental Status: Mental status is at baseline.     Labs on Admission: I have personally reviewed following labs and imaging studies  CBC: Recent Labs  Lab 08/17/24 2241  WBC 9.4  NEUTROABS 7.4  HGB 12.6  HCT 39.3  MCV 91.0  PLT 234   Basic Metabolic Panel: Recent Labs  Lab 08/17/24 2241  NA 138  K 3.6  CL 101  CO2 22  GLUCOSE 94  BUN 15  CREATININE 0.92  CALCIUM  9.6   GFR: Estimated Creatinine Clearance: 31.4 mL/min (by C-G formula based on SCr of 0.92 mg/dL). Liver Function Tests: No results for input(s): AST, ALT, ALKPHOS, BILITOT, PROT, ALBUMIN in the last 168 hours. No results for input(s): LIPASE, AMYLASE in the last 168 hours. No results for input(s): AMMONIA in the last 168 hours. Coagulation Profile: No results for input(s): INR, PROTIME in the last 168 hours. Cardiac Enzymes: No results for input(s): CKTOTAL, CKMB, CKMBINDEX, TROPONINI in the last 168 hours. BNP (last 3 results) No results for input(s): PROBNP in the last 8760 hours. HbA1C: No results for input(s): HGBA1C in the last 72 hours. CBG: No results for input(s): GLUCAP in the last 168 hours. Lipid Profile: No results for input(s):  CHOL, HDL, LDLCALC, TRIG, CHOLHDL, LDLDIRECT in the last 72 hours. Thyroid  Function Tests: No results for input(s): TSH, T4TOTAL, FREET4, T3FREE, THYROIDAB in the last 72 hours. Anemia Panel: No results for input(s): VITAMINB12, FOLATE, FERRITIN, TIBC, IRON, RETICCTPCT in the last 72 hours. Urine analysis:    Component Value Date/Time   COLORURINE YELLOW (A) 08/18/2024 0118   APPEARANCEUR HAZY (A) 08/18/2024 0118   APPEARANCEUR Clear 07/08/2014 2155   LABSPEC 1.017 08/18/2024 0118   LABSPEC 1.003 07/08/2014 2155   PHURINE 5.0 08/18/2024 0118   GLUCOSEU NEGATIVE 08/18/2024 0118   GLUCOSEU Negative 07/08/2014 2155   HGBUR SMALL (A) 08/18/2024 0118   BILIRUBINUR NEGATIVE 08/18/2024 0118   BILIRUBINUR Negative 07/08/2014 2155   KETONESUR NEGATIVE 08/18/2024 0118   PROTEINUR NEGATIVE 08/18/2024 0118   NITRITE NEGATIVE 08/18/2024 0118   LEUKOCYTESUR SMALL (A) 08/18/2024 0118   LEUKOCYTESUR Negative 07/08/2014 2155    Radiological Exams on Admission: DG Chest Portable 1 View Result Date: 08/17/2024 EXAM: 1 VIEW(S) XRAY OF THE CHEST 08/17/2024 11:05:00 PM COMPARISON: 06/17/2024 CLINICAL HISTORY: hypoxia, eval infiltrate, pulm congestion FINDINGS: LUNGS AND PLEURA: Inspiration. Opacity in the left lung base likely representing atelectasis or infiltration with possible small left pleural effusion. Similar appearance to previous study. Probable emphysematous changes in the lungs. No pneumothorax. HEART AND MEDIASTINUM: Heart size and pulmonary vascularity are normal. Mediastinal contours appear intact. Tortuous and calcified aorta. BONES AND SOFT TISSUES: Degenerative changes in the spine and shoulders. S-shaped thoracolumbar scoliosis. No acute osseous abnormality. IMPRESSION: 1. Opacity in the left lung base, likely representing atelectasis or infiltration with possible small left pleural effusion, similar to previous study. 2. Probable emphysematous changes in the  lungs. Electronically signed by: Elsie Gravely MD 08/17/2024 11:10 PM EST RP Workstation: [id number]   Data Reviewed for HPI: Relevant notes from primary care and specialist visits, past discharge summaries as available in EHR, including Care Everywhere. Prior diagnostic testing as pertinent to current admission diagnoses Updated medications and problem lists for reconciliation ED course, including vitals, labs, imaging, treatment and response to treatment Triage notes, nursing and pharmacy notes and ED provider's notes Notable results as noted above in  HPI      Assessment and Plan: * Urinary tract infection Rocephin  Follow cultures  Acute metabolic encephalopathy Dementia Daughter reported increased confusion over the past week to ED provider Possibly related to acute infection, UTI Chest x-ray showed some concern for infiltrate and initial report was shortness of breath and hypoxia Getting respiratory viral panel--> Neurologic checks, fall and aspiration precautions Delirium precautions  History of Clostridium difficile infection 06/2024 No acute issues suspected  Chronic respiratory failure with hypoxia (HCC) Initial consult on for hypoxia,, however patient has saturating in the high 90s on O2 at 3 L Continue to monitor for increased need  Chronic heart failure with preserved ejection fraction (HFpEF) (HCC) Clinically euvolemic Continue home GDMT, carvedilol , losartan , furosemide   Seizure disorder (HCC) Continue lamotrigine   History of CVA (cerebrovascular accident) No acute issues Continue home meds pending verification  Essential hypertension Continue home meds     DVT prophylaxis: Lovenox   Consults: none  Advance Care Planning:   Code Status: Prior   Family Communication: none  Disposition Plan: Back to previous home environment  Severity of Illness: The appropriate patient status for this patient is OBSERVATION. Observation status is judged to  be reasonable and necessary in order to provide the required intensity of service to ensure the patient's safety. The patient's presenting symptoms, physical exam findings, and initial radiographic and laboratory data in the context of their medical condition is felt to place them at decreased risk for further clinical deterioration. Furthermore, it is anticipated that the patient will be medically stable for discharge from the hospital within 2 midnights of admission.   Author: Delayne LULLA Solian, MD 08/18/2024 2:48 AM  For on call review [email or web address].      [1] No Known Allergies  "

## 2024-08-18 NOTE — Assessment & Plan Note (Signed)
 No acute issues suspected

## 2024-08-18 NOTE — Plan of Care (Signed)
 No charge note, patient was admitted after midnight. H&P reviewed Patient was admitted with UTI, encephalopathy Patient was noticed to have hypertensive urgency Continued ceftriaxone , follow urine culture Continue antihypertensive medications, monitor BP and titrate medications accordingly Patient is from ALF, may not be able to go back to the facility. Follow PT/OT and TOC for placement

## 2024-08-18 NOTE — Assessment & Plan Note (Signed)
Continue lamotrigine 

## 2024-08-18 NOTE — TOC Initial Note (Signed)
 Transition of Care [facility]) - Initial/Assessment Note    Patient Details  Name: Kellie Harris MRN: [id number] Date of Birth: [birth year]  Transition of Care [facility]) CM/SW Contact:    Grayce JAYSON Perfect, RN Phone Number: 08/18/2024, 3:45 PM  Clinical Narrative:                 Patient resident at [facility] ALF.  Admitted with shortness of breath and on continuous oxygen.  Patient also with UTI. RNCM reached out to facility to assure she would discharge back to ALF.  Her baseline is ambulating alone with walker and assistance with showers.  RNCM inquired about PT/OT referral , will continue to monitor plan of discharge.  Expected Discharge Plan: Assisted Living Barriers to Discharge: No Barriers Identified   Patient Goals and CMS Choice            Expected Discharge Plan and Services   Discharge Planning Services: CM Consult   Living arrangements for the past 2 months: (P) Assisted Living Facility                                      Prior Living Arrangements/Services Living arrangements for the past 2 months: (P) Assisted Living Facility Lives with:: Other (Comment) (ALF- [facility]) Patient language and need for interpreter reviewed:: Yes Do you feel safe going back to the place where you live?: Yes      Need for Family Participation in Patient Care: Yes (Comment) (will notify family when patient ready for discharge back to ALF) Care giver support system in place?: Yes (comment)   Criminal Activity/Legal Involvement Pertinent to Current Situation/Hospitalization: No - Comment as needed  Activities of Daily Living   ADL Screening (condition at time of admission) Is the patient deaf or have difficulty hearing?: No Does the patient have difficulty seeing, even when wearing glasses/contacts?: No Does the patient have difficulty concentrating, remembering, or making decisions?: No  Permission Sought/Granted                  Emotional Assessment Appearance:: Well-Groomed,  Appears stated age Attitude/Demeanor/Rapport: Engaged (dementia- diagnosis) Affect (typically observed): Calm Orientation: : Oriented to Self Alcohol / Substance Use: Never Used Psych Involvement: No (comment)  Admission diagnosis:  Urinary tract infection [N39.0] Acute UTI [N39.0] Food impaction of esophagus, initial encounter [U81.871J, W44.F3XA] Altered mental status, unspecified altered mental status type [R41.82] Patient Active Problem List   Diagnosis Date Noted   Urinary tract infection 08/18/2024   Chronic respiratory failure with hypoxia (HCC) 08/18/2024   History of Clostridium difficile infection 06/2024 08/18/2024   Acute metabolic encephalopathy 08/18/2024   Chronic heart failure with preserved ejection fraction (HFpEF) (HCC) 08/18/2024   Acute hypoxic respiratory failure (HCC) 06/22/2024   CKD (chronic kidney disease) stage 2, GFR 60-89 ml/min 06/20/2024   C. difficile colitis 06/18/2024   Hypophosphatemia 06/18/2024   Acute pain of left knee 06/18/2024   Hypomagnesemia 06/17/2024   Hypoxia 06/17/2024   Lobar pneumonia 06/17/2024   Acute on chronic diastolic CHF (congestive heart failure) (HCC) 06/16/2024   Expressive aphasia 02/18/2024   Altered mental status 02/18/2024   Hyponatremia 09/08/2021   Acute blood loss anemia 09/08/2021   Hypokalemia 09/07/2021   History of total hip replacement, left 09/04/2021   Dizziness 01/28/2021   Aphasia 05/03/2020   S/P ORIF (open reduction internal fixation) fracture radius and ulnar fx 04/08/18 04/10/2018   Distal radius  and ulna fracture, right    History of CVA (cerebrovascular accident)    Tachycardia    Leukocytosis    Seizure disorder (HCC)    Stroke-like episode (HCC) s/p IV tPA 04/03/2018   Syncope    TIA (transient ischemic attack) 11/04/2017   Malnutrition of moderate degree 08/26/2017   Total knee replacement status 08/07/2017   Closed left hip fracture (HCC) 02/12/2017   Essential hypertension 02/12/2017    HLD (hyperlipidemia) 02/12/2017   PCP:  Patient, No Pcp Per Pharmacy:   [facility] - [place], [place] - [address] [place] [address] Phone: [phone] Fax: [phone]     Social Drivers of Health (SDOH) Social History: SDOH Screenings   Food Insecurity: No Food Insecurity (08/18/2024)  Housing: Low Risk (08/18/2024)  Transportation Needs: No Transportation Needs (08/18/2024)  Utilities: Not At Risk (08/18/2024)  Financial Resource Strain: Low Risk  (07/17/2023)   Received from [facility]  Social Connections: Unknown (08/18/2024)  Recent Concern: Social Connections - Socially Isolated (06/16/2024)  Tobacco Use: Low Risk (08/17/2024)   SDOH Interventions:     Readmission Risk Interventions     No data to display

## 2024-08-18 NOTE — Assessment & Plan Note (Signed)
"-   Continue home meds  "

## 2024-08-18 NOTE — Assessment & Plan Note (Addendum)
 Dementia Daughter reported increased confusion over the past week to ED provider Possibly related to acute infection, UTI Chest x-ray showed some concern for infiltrate and initial report was shortness of breath and hypoxia Getting respiratory viral panel--> Neurologic checks, fall and aspiration precautions Delirium precautions

## 2024-08-18 NOTE — Assessment & Plan Note (Signed)
 Clinically euvolemic Continue home GDMT, carvedilol , losartan , furosemide 

## 2024-08-18 NOTE — ED Provider Notes (Signed)
 11:30 PM  Assumed care at shift change.  Patient initially here for hypoxia likely secondary to extended oxygen tubing.  Now back to baseline with normal sats.  Chest x-ray showed likely atelectasis in the left lower lung similar to previous imaging.  Less likely pneumonia.  No fever or leukocytosis.  Family does report slightly increased confusion from baseline.  History of dementia.  Urinalysis pending but anticipate discharge home with family.  2:14 AM Patient's urine appears acutely infected.  Will add on culture and give Rocephin .  Discussed with patient's daughter who is at bedside who states that she feels the patient has been progressively worsening with altered mental status for the past 10 days.  Patient lives at [facility] assisted living and she does not feel that they are able to care for the patient at this time.  Will admit to the hospitalist for altered mental status due to UTI.  Did offer head CT but patient's daughter wants to hold off at this time stating that she has had many scans previously.   Zeven Kocak, Josette SAILOR, DO 08/18/24 [phone]

## 2024-08-18 NOTE — Progress Notes (Signed)
" °   08/18/24 0745  Assess: MEWS Score  Temp (!) 97.5 F (36.4 C)  BP (!) 209/91  MAP (mmHg) 124  Pulse Rate 67  Resp 18  Level of Consciousness Alert  SpO2 100 %  O2 Device Room Air;Nasal Cannula  O2 Flow Rate (L/min) 3 L/min  Assess: MEWS Score  MEWS Temp 0  MEWS Systolic 2  MEWS Pulse 0  MEWS RR 0  MEWS LOC 0  MEWS Score 2  MEWS Score Color Yellow  Assess: if the MEWS score is Yellow or Red  Were vital signs accurate and taken at a resting state? Yes  Does the patient meet 2 or more of the SIRS criteria? Yes  Does the patient have a confirmed or suspected source of infection? Yes  MEWS guidelines implemented  Yes, yellow  Treat  MEWS Interventions Considered administering scheduled or prn medications/treatments as ordered  Take Vital Signs  Increase Vital Sign Frequency  Yellow: Q2hr x1, continue Q4hrs until patient remains green for 12hrs  Escalate  MEWS: Escalate Yellow: Discuss with charge nurse and consider notifying provider and/or RRT  Notify: Charge Nurse/RN  Name of Charge Nurse/RN Notified Brandi,RN  Provider Notification  Provider Name/Title D.Kumar, MD  Date Provider Notified 08/18/24  Time Provider Notified [phone]  Method of Notification Page  Notification Reason Other (Comment) (low temp, elevated BP)  Assess: SIRS CRITERIA  SIRS Temperature  0  SIRS Respirations  0  SIRS Pulse 0  SIRS WBC 0  SIRS Score Sum  0   Patient was given Coreg  early, ineffective in lowering BP, Dr. Von notified via secure chat. See new orders.  "

## 2024-08-19 ENCOUNTER — Observation Stay

## 2024-08-19 ENCOUNTER — Other Ambulatory Visit: Payer: Self-pay

## 2024-08-19 DIAGNOSIS — E43 Unspecified severe protein-calorie malnutrition: Secondary | ICD-10-CM | POA: Insufficient documentation

## 2024-08-19 DIAGNOSIS — N3000 Acute cystitis without hematuria: Secondary | ICD-10-CM

## 2024-08-19 DIAGNOSIS — N3001 Acute cystitis with hematuria: Secondary | ICD-10-CM | POA: Diagnosis not present

## 2024-08-19 LAB — CBC
HCT: 36.5 % (ref 36.0–46.0)
Hemoglobin: 11.7 g/dL — ABNORMAL LOW (ref 12.0–15.0)
MCH: 29.3 pg (ref 26.0–34.0)
MCHC: 32.1 g/dL (ref 30.0–36.0)
MCV: 91.5 fL (ref 80.0–100.0)
Platelets: 240 K/uL (ref 150–400)
RBC: 3.99 MIL/uL (ref 3.87–5.11)
RDW: 15.3 % (ref 11.5–15.5)
WBC: 11.8 K/uL — ABNORMAL HIGH (ref 4.0–10.5)
nRBC: 0 % (ref 0.0–0.2)

## 2024-08-19 LAB — BASIC METABOLIC PANEL WITH GFR
Anion gap: 16 — ABNORMAL HIGH (ref 5–15)
BUN: 20 mg/dL (ref 8–23)
CO2: 22 mmol/L (ref 22–32)
Calcium: 8.9 mg/dL (ref 8.9–10.3)
Chloride: 101 mmol/L (ref 98–111)
Creatinine, Ser: 1.11 mg/dL — ABNORMAL HIGH (ref 0.44–1.00)
GFR, Estimated: 47 mL/min — ABNORMAL LOW
Glucose, Bld: 82 mg/dL (ref 70–99)
Potassium: 3.1 mmol/L — ABNORMAL LOW (ref 3.5–5.1)
Sodium: 139 mmol/L (ref 135–145)

## 2024-08-19 LAB — PROCALCITONIN: Procalcitonin: 0.18 ng/mL

## 2024-08-19 MED ORDER — ADULT MULTIVITAMIN LIQUID CH
15.0000 mL | Freq: Every day | ORAL | Status: DC
  Filled 2024-08-19: qty 15

## 2024-08-19 MED ORDER — DICLOFENAC SODIUM 1 % EX GEL
2.0000 g | Freq: Four times a day (QID) | CUTANEOUS | Status: DC
  Administered 2024-08-19 – 2024-08-24 (×22): 2 g via TOPICAL
  Filled 2024-08-19: qty 100

## 2024-08-19 MED ORDER — ENSURE PLUS HIGH PROTEIN PO LIQD
237.0000 mL | Freq: Three times a day (TID) | ORAL | Status: DC
  Administered 2024-08-19 – 2024-08-24 (×11): 237 mL via ORAL

## 2024-08-19 MED ORDER — POTASSIUM CHLORIDE CRYS ER 20 MEQ PO TBCR
40.0000 meq | EXTENDED_RELEASE_TABLET | Freq: Once | ORAL | Status: AC
  Administered 2024-08-19: 40 meq via ORAL
  Filled 2024-08-19: qty 2

## 2024-08-19 MED ORDER — THIAMINE MONONITRATE 100 MG PO TABS
100.0000 mg | ORAL_TABLET | Freq: Every day | ORAL | Status: DC
  Administered 2024-08-20 – 2024-08-21 (×2): 100 mg via ORAL
  Filled 2024-08-19 (×3): qty 1

## 2024-08-19 MED ORDER — ADULT MULTIVITAMIN W/MINERALS CH
1.0000 | ORAL_TABLET | Freq: Every day | ORAL | Status: DC
  Administered 2024-08-20 – 2024-08-21 (×2): 1 via ORAL
  Filled 2024-08-19 (×4): qty 1

## 2024-08-19 MED ORDER — HYDRALAZINE HCL 50 MG PO TABS
50.0000 mg | ORAL_TABLET | Freq: Four times a day (QID) | ORAL | Status: DC | PRN
  Administered 2024-08-22: 50 mg via ORAL
  Filled 2024-08-19: qty 1

## 2024-08-19 NOTE — Progress Notes (Signed)
 Initial Nutrition Assessment  DOCUMENTATION CODES:   Underweight, Severe malnutrition in context of chronic illness  INTERVENTION:   -Continue dysphagia 3 diet with thin liquids -MVI with minerals daily -Ensure Plus High Protein po TID, each supplement provides 350 kcal and 20 grams of protein  -Magic cup TID with meals, each supplement provides 290 kcal and 9 grams of protein  -Feeding assistance with meals -100 mg thiamine  daily x 7 days -Monitor Mg, K, and Phos and replete as needed secondary to high refeeding risk  -Case and recommendations discussed with RN, MD, SLP, and palliative care   NUTRITION DIAGNOSIS:   Severe Malnutrition related to chronic illness (CHF, dementia) as evidenced by moderate muscle depletion, severe muscle depletion, moderate fat depletion, severe fat depletion.  GOAL:   Patient will meet greater than or equal to 90% of their needs  MONITOR:   PO intake, Supplement acceptance, Diet advancement  REASON FOR ASSESSMENT:   Consult Assessment of nutrition requirement/status  ASSESSMENT:   89 y.o. female with medical history significant for HTN, dementia, chronic HFpEF, history of C. difficile in November 2025, when she was hospitalized for acute dyspnea, attributed to CHF exacerbation, discharged on O2 at 2 L, being admitted from ALF with a UTI.  Patient admitted with UTI, acute metabolic encephalopathy, and UTI.   1/7- s/p BSE- dysphagia 3 diet with thin liquids  Reviewed I/O's: +100 ml x 24 hours and +200 ml since admission  Per H&P, patient is from ALF [facility] and may be be able to return to facility.   Case discussed with SLP, who requested RD evaluate patient for nutritional recommendations. Patient has functional oropharyngeal swallowing, but esophogeal dysmotility. Noted history of esophageal dilation on 02/2024 per GI.   Per SLP, patient with very limited oral intake and needs feeding assistance at baseline. She drank a large amount of Ensure  and some bites of Magic Cup at time of visit. SLP reports she does not like taking pills, but recommends them be administered in puree.   Patient sleeping soundly at time of visit. Of note, patient is hard of hearing. Patient woke up briefly when arouse, but quickly went back to sleep. RD was unable to obtain any history from patient and no family at bedside. Observed patient consumed a few bites of applesauce, 25% of Magic Cup, and about 75% of Ensure supplement. Per RN, liquid MVI was trialed for ease of intake, but patient had episode of emesis when administered.   Reviewed weight history. Patient has experienced a 9.8% weight loss over the past 6 months. While this is not significant for time frame, it is concerning given poor oral intake, advanced age, and dementia.   Patient meets criteria for malnutrition and is at high refeeding risk. Md has ordered electrolyte labs; RD will add thiamine  to minimize risk. RD agrees with SLP recommendations for palliative care consult for goals of care discussions. Given patient's malnutrition, advanced age, and dementia, would be hesitant to recommend artifical means of nutrition/ hydration, as this would not impact patient's quality of life. Case and recommendations discussed with RN, SLP, MD, and palliative care.   Labs reviewed: K: 3.1 (on PO supplementation). Phos and Mg WDL.    NUTRITION - FOCUSED PHYSICAL EXAM:  Flowsheet Row Most Recent Value  Orbital Region Moderate depletion  Upper Arm Region Severe depletion  Thoracic and Lumbar Region Moderate depletion  Buccal Region Severe depletion  Temple Region Moderate depletion  Clavicle Bone Region Severe depletion  Clavicle and Acromion  Bone Region Severe depletion  Scapular Bone Region Severe depletion  Dorsal Hand Severe depletion  Patellar Region Severe depletion  Anterior Thigh Region Severe depletion  Posterior Calf Region Severe depletion  Edema (RD Assessment) None  Hair Reviewed  Eyes  Reviewed  Mouth Reviewed  Skin Reviewed  Nails Reviewed    Diet Order:   Diet Order             DIET DYS 3 Room service appropriate? No; Fluid consistency: Thin  Diet effective now                   EDUCATION NEEDS:   Not appropriate for education at this time  Skin:  Skin Assessment: Reviewed RN Assessment  Last BM:  08/19/24  Height:   Ht Readings from Last 1 Encounters:  08/18/24 5' 4 (1.626 m)    Weight:   Wt Readings from Last 1 Encounters:  08/18/24 43.4 kg    Ideal Body Weight:  54.5 kg  BMI:  Body mass index is 16.42 kg/m.  Estimated Nutritional Needs:   Kcal:  1300-1500  Protein:  65-80 grams  Fluid:  1.3-1.5 L    Margery ORN, RD, LDN, CDCES Registered Dietitian III Certified Diabetes Care and Education Specialist If unable to reach this RD, please use RD Inpatient group chat on secure chat between hours of 8am-4 pm daily

## 2024-08-19 NOTE — Consult Note (Signed)
 PHARMACY CONSULT NOTE - ELECTROLYTES  Pharmacy Consult for Electrolyte Monitoring and Replacement   Recent Labs: Height: 5' 4 (162.6 cm) Weight: 43.4 kg (95 lb 10.9 oz) IBW/kg (Calculated) : 54.7 Estimated Creatinine Clearance: 22.6 mL/min (A) (by C-G formula based on SCr of 1.11 mg/dL (H)). Potassium (mmol/L)  Date Value  08/19/2024 3.1 (L)  07/10/2014 3.8   Magnesium  (mg/dL)  Date Value  98/93/7973 1.9   Calcium  (mg/dL)  Date Value  98/92/7973 8.9   Calcium , Total (mg/dL)  Date Value  88/71/7984 10.1   Albumin (g/dL)  Date Value  88/95/7974 3.8  07/08/2014 4.2   Phosphorus (mg/dL)  Date Value  98/93/7973 3.5   Sodium (mmol/L)  Date Value  08/19/2024 139  07/10/2014 138    Assessment  Kellie Harris is a 89 y.o. female presenting with UTI. PMH significant for HTN, dementia, and HFpEF. Pharmacy has been consulted to monitor and replace electrolytes.  Diet: Heart health MIVF: N/A Pertinent medications: losartan  100 mg, furosemide  20  Goal of Therapy: Electrolytes WNL  Plan:  K = 3.1, give Kcl 40 mEq po x 1 Check BMP, Mg, Phos with AM labs  Thank you for allowing pharmacy to be a part of this patient's care.  Kayla JULIANNA Blew, PharmD Clinical Pharmacist 08/19/2024 8:56 AM

## 2024-08-19 NOTE — Progress Notes (Signed)
 " Progress Note   Patient: Kellie Harris FMW:[id number] DOB: 1931-06-27 DOA: 08/17/2024     0 DOS: the patient was seen and examined on 08/19/2024   Brief hospital course: Per H&P HPI   Kellie Harris is a 89 y.o. female with medical history significant for HTN, dementia, chronic HFpEF, history of C. difficile in November 2025, when she was hospitalized for acute dyspnea, attributed to CHF exacerbation, discharged on O2 at 2 L, being admitted from ALF with a UTI.  She was originally sent to the ED due to concerns for shortness of breath and hypoxia to 84% on her 3 L in the setting of 1 week history of increasing confusion.  O2 sats remained normal in the mid 90s with EMS so they attributed the initial low sats to the length of the O2 tubing at the facility. Upon arrival, remained with sats in the high 90s on 3 L with otherwise normal vitals. Labs showed UA with small leuks and a few bacteria.  CBC and BMP normal albeit with a slightly elevated anion gap of 16.EKG showed sinus at 76 and chest x-ray showed Opacity in the left lung base, likely representing atelectasis or infiltration with possible small left pleural effusion, similar to previous study  Assessment and Plan: * Urinary tract infection Rocephin  Ucx with klebsiella F/u speciation    Acute metabolic encephalopathy Dementia Unclear if back to baseline.  Difficult to assess given patient is very hard of hearing. Related to urinary tract infection versus lower respiratory tract infection Delirium precautions - Continue antibiotics per above - Will discuss with family if patient is back to baseline mentation   Chronic respiratory failure with hypoxia (HCC) Initially hypoxic on home 3L of O2. Reportedly with cough as well. CXR with infiltrate v atelectasis in LLL. Patient with O2 sats in the high 90s on 3L O2. Procal 0.18.  Currently on ceftriaxone  for UTI -Continue antibiotics -Flutter valve -Repeat cxr  Continue to  monitor  Hypokalemia  Replace PRN   Chronic heart failure with preserved ejection fraction (HFpEF) (HCC) Clinically euvolemic Continue home GDMT, carvedilol , losartan   Seizure disorder (HCC) Continue lamotrigine   History of CVA (cerebrovascular accident) No acute issues Continue home meds  Essential hypertension Continue home meds  History of Clostridium difficile infection 06/2024 No acute issues suspected  L hip pain  S/p L hip replacement 2 years ago  S/p R knee replacement  Noted to have this since before Christmas time. She had an Xray at her facility which was negative for fracture per patient's daughter. Per therapy teams patient complained of L hip pain when working with patient.  Will check Xray     Subjective: Very hard of hearing. No acute issues overnight.   Physical Exam: Vitals:   08/18/24 1745 08/18/24 2113 08/19/24 0401 08/19/24 0750  BP: 123/67 (!) 160/94 (!) 174/79 (!) 106/50  Pulse: 94 83 74 78  Resp: 18   15  Temp: 98.9 F (37.2 C) 97.9 F (36.6 C) 98.1 F (36.7 C) 98.5 F (36.9 C)  TempSrc: Oral Oral Oral Oral  SpO2: 98% 98% 100% 96%  Weight:      Height:       Physical Exam  Constitutional: In no distress.  Cardiovascular: Normal rate, regular rhythm. No lower extremity edema  Pulmonary: Non labored breathing on [place], no wheezing or rales.   Abdominal: Soft. Non distended and non tender Neurological: Alert and oriented very hard of hearing.  Skin: Skin is warm and [place].  Data Reviewed:     Latest Ref Rng & Units 08/19/2024    4:06 AM 08/18/2024    9:51 AM 08/17/2024   10:41 PM  BMP  Glucose 70 - 99 mg/dL 82  84  94   BUN 8 - 23 mg/dL 20  16  15    Creatinine 0.44 - 1.00 mg/dL 8.88  9.02  9.07   Sodium 135 - 145 mmol/L 139  139  138   Potassium 3.5 - 5.1 mmol/L 3.1  3.9  3.6   Chloride 98 - 111 mmol/L 101  101  101   CO2 22 - 32 mmol/L 22  21  22    Calcium  8.9 - 10.3 mg/dL 8.9  9.3  9.6       Latest Ref Rng & Units 08/19/2024     4:06 AM 08/17/2024   10:41 PM 07/30/2024    5:40 PM  CBC  WBC 4.0 - 10.5 K/uL 11.8  9.4  11.6   Hemoglobin 12.0 - 15.0 g/dL 88.2  87.3  88.5   Hematocrit 36.0 - 46.0 % 36.5  39.3  35.9   Platelets 150 - 400 K/uL 240  234  163      Family Communication: Daughter  Disposition: Status is: Observation The patient remains OBS appropriate and will d/c before 2 midnights.  Planned Discharge Destination: Skilled nursing facility    Time spent: 35 minutes  Author: Alban Pepper, MD 08/19/2024 3:26 PM  For on call review [email or web address].  "

## 2024-08-19 NOTE — [place] FL2 (Signed)
 " [place]  MEDICAID FL2 LEVEL OF CARE FORM     IDENTIFICATION  Patient Name: Kellie Harris Birthdate: Jul 28, 1931 Sex: female Admission Date (Current Location): 08/17/2024  [facility] and [place] Number:  Chiropodist and Address:  [facility], [address] [place], [place] [address]      Provider Number: [id number]  Attending Physician Name and Address:  Franchot Novel, MD  Relative Name and Phone Number:  [email or web address],[name]  Daughter, Emergency Contact  [phone]    Current Level of Care: Hospital Recommended Level of Care: Skilled Nursing Facility Prior Approval Number:    Date Approved/Denied:   PASRR Number: [id number]  Discharge Plan: SNF    Current Diagnoses: Patient Active Problem List   Diagnosis Date Noted   Urinary tract infection 08/18/2024   Chronic respiratory failure with hypoxia (HCC) 08/18/2024   History of Clostridium difficile infection 06/2024 08/18/2024   Acute metabolic encephalopathy 08/18/2024   Chronic heart failure with preserved ejection fraction (HFpEF) (HCC) 08/18/2024   Acute hypoxic respiratory failure (HCC) 06/22/2024   CKD (chronic kidney disease) stage 2, GFR 60-89 ml/min 06/20/2024   C. difficile colitis 06/18/2024   Hypophosphatemia 06/18/2024   Acute pain of left knee 06/18/2024   Hypomagnesemia 06/17/2024   Hypoxia 06/17/2024   Lobar pneumonia 06/17/2024   Acute on chronic diastolic CHF (congestive heart failure) (HCC) 06/16/2024   Expressive aphasia 02/18/2024   Altered mental status 02/18/2024   Hyponatremia 09/08/2021   Acute blood loss anemia 09/08/2021   Hypokalemia 09/07/2021   History of total hip replacement, left 09/04/2021   Dizziness 01/28/2021   Aphasia 05/03/2020   S/P ORIF (open reduction internal fixation) fracture radius and ulnar fx 04/08/18 04/10/2018   Distal radius and ulna fracture, right    History of CVA (cerebrovascular accident)    Tachycardia    Leukocytosis    Seizure  disorder (HCC)    Stroke-like episode (HCC) s/p IV tPA 04/03/2018   Syncope    TIA (transient ischemic attack) 11/04/2017   Malnutrition of moderate degree 08/26/2017   Total knee replacement status 08/07/2017   Closed left hip fracture (HCC) 02/12/2017   Essential hypertension 02/12/2017   HLD (hyperlipidemia) 02/12/2017    Orientation RESPIRATION BLADDER Height & Weight     Self    Incontinent Weight: 43.4 kg Height:  5' 4 (162.6 cm)  BEHAVIORAL SYMPTOMS/MOOD NEUROLOGICAL BOWEL NUTRITION STATUS      Incontinent  (Dysphagia 3 Diet)  AMBULATORY STATUS COMMUNICATION OF NEEDS Skin   Extensive Assist Verbally                         Personal Care Assistance Level of Assistance  Bathing, Feeding, Dressing Bathing Assistance: Maximum assistance Feeding assistance: Limited assistance Dressing Assistance: Maximum assistance     Functional Limitations Info  Hearing, Sight, Speech Sight Info: Impaired Hearing Info: Impaired Speech Info: Adequate    SPECIAL CARE FACTORS FREQUENCY  PT (By licensed PT), OT (By licensed OT)     PT Frequency: 5 x week OT Frequency: 5 x week            Contractures      Additional Factors Info  Code Status, Allergies Code Status Info: DNR Limited Allergies Info: NKA           Current Medications (08/19/2024):  This is the current hospital active medication list Current Facility-Administered Medications  Medication Dose Route Frequency Provider Last Rate Last Admin   acetaminophen  (  TYLENOL ) tablet 650 mg  650 mg Oral Q6H PRN Duncan, Hazel V, MD       Or   acetaminophen  (TYLENOL ) suppository 650 mg  650 mg Rectal Q6H PRN Cleatus Delayne GAILS, MD       albuterol  (PROVENTIL ) (2.5 MG/3ML) 0.083% nebulizer solution 2.5 mg  2.5 mg Nebulization Q2H PRN Duncan, Hazel V, MD       aspirin  chewable tablet 81 mg  81 mg Oral Daily Duncan, Hazel V, MD   81 mg at 08/18/24 0827   atorvastatin  (LIPITOR) tablet 20 mg  20 mg Oral QHS Duncan, Hazel V, MD    20 mg at 08/18/24 2233   carvedilol  (COREG ) tablet 6.25 mg  6.25 mg Oral BID WC Duncan, Hazel V, MD   6.25 mg at 08/18/24 1659   cefTRIAXone  (ROCEPHIN ) 1 g in sodium chloride  0.9 % 100 mL IVPB  1 g Intravenous Q24H Duncan, Hazel V, MD 200 mL/hr at 08/18/24 2237 1 g at 08/18/24 2237   diclofenac  Sodium (VOLTAREN ) 1 % topical gel 2 g  2 g Topical QID Franchot Novel, MD   2 g at 08/19/24 1256   enoxaparin  (LOVENOX ) injection 30 mg  30 mg Subcutaneous Q24H Niels Kayla FALCON, RPH   30 mg at 08/19/24 0957   feeding supplement (ENSURE PLUS HIGH PROTEIN) liquid 237 mL  237 mL Oral TID BM Franchot Novel, MD   237 mL at 08/19/24 1135   furosemide  (LASIX ) tablet 20 mg  20 mg Oral Daily Duncan, Hazel V, MD   20 mg at 08/18/24 9173   hydrALAZINE  (APRESOLINE ) tablet 50 mg  50 mg Oral Q6H PRN Von Bellis, MD   50 mg at 08/19/24 9479   Or   hydrALAZINE  (APRESOLINE ) tablet 50 mg  50 mg Oral Q6H PRN Von Bellis, MD       lamoTRIgine  (LAMICTAL ) tablet 100 mg  100 mg Oral q AM Cleatus Delayne GAILS, MD   100 mg at 08/19/24 0606   lamoTRIgine  (LAMICTAL ) tablet 125 mg  125 mg Oral QHS Duncan, Hazel V, MD   125 mg at 08/18/24 2233   losartan  (COZAAR ) tablet 100 mg  100 mg Oral Daily Cleatus Delayne GAILS, MD   100 mg at 08/18/24 0827   multivitamin with minerals tablet 1 tablet  1 tablet Oral Daily Franchot Novel, MD       ondansetron  (ZOFRAN ) tablet 4 mg  4 mg Oral Q6H PRN Cleatus Delayne GAILS, MD       Or   ondansetron  (ZOFRAN ) injection 4 mg  4 mg Intravenous Q6H PRN Duncan, Hazel V, MD   4 mg at 08/19/24 9163   pantoprazole  (PROTONIX ) EC tablet 20 mg  20 mg Oral Daily Cleatus Delayne GAILS, MD   20 mg at 08/19/24 9046   thiamine  (VITAMIN B1) tablet 100 mg  100 mg Oral Daily Franchot Novel, MD       timolol  (TIMOPTIC ) 0.5 % ophthalmic solution 1 drop  1 drop Both Eyes Daily Cleatus Delayne GAILS, MD   1 drop at 08/19/24 9043     Discharge Medications: Please see discharge summary for a list of discharge  medications.  Relevant Imaging Results:  Relevant Lab Results:   Additional Information SS# [id number]  Dalia GORMAN Fuse, RN     "

## 2024-08-19 NOTE — TOC Progression Note (Addendum)
 Transition of Care [facility]) - Progression Note    Patient Details  Name: CESIA ORF MRN: [id number] Date of Birth: 07-02-1931  [facility] CM/SW Contact  Grayce JAYSON Perfect, RN Phone Number: 08/19/2024, 2:16 PM  Clinical Narrative   PT/OT recommends skilled rehab -requires 2+ max assist with all mobility at this time.  [facility]  FL2 completed, PASRR active.  CM spoke to patient's daughter, [name], to notify her of this recommendation.   She is in agreement and request [facility] resources if possible as patient has been there before and they will know her  CM sent out referral to all local SNF's. [facility] has accepted this referral, authorization pending. Daughter notified.   Expected Discharge Plan: Assisted Living Barriers to Discharge: No Barriers Identified               Expected Discharge Plan and Services   Discharge Planning Services: CM Consult   Living arrangements for the past 2 months: (P) Assisted Living Facility                                       Social Drivers of Health (SDOH) Interventions SDOH Screenings   Food Insecurity: No Food Insecurity (08/18/2024)  Housing: Low Risk (08/18/2024)  Transportation Needs: No Transportation Needs (08/18/2024)  Utilities: Not At Risk (08/18/2024)  Financial Resource Strain: Low Risk  (07/17/2023)   Received from [facility]  Social Connections: Socially Isolated (08/19/2024)  Tobacco Use: Low Risk (08/17/2024)    Readmission Risk Interventions     No data to display

## 2024-08-19 NOTE — Evaluation (Signed)
 Physical Therapy Evaluation Patient Details Name: Kellie Harris MRN: [id number] DOB: 1931-05-07 Today's Date: 08/19/2024  History of Present Illness  Kellie Harris is a 89 y.o. female with medical history significant for HTN, dementia, chronic HFpEF, history of cdiff, on 2L of O2 at baseline. Presented to ED for hypoxia and AMS. Diagnosis of UTI.  Clinical Impression  Patient received in bed, sleeping. She is very HOH and difficult to communicate with. Patient requires max +2 assist with all mobility. Reporting a lot of pain in L LE with bed mobility and attempts to stand. She is unable to put weight on L LE in standing with +2 assist. Patient will continue to benefit from skilled PT to improve independence and safety.          If plan is discharge home, recommend the following: Two people to help with walking and/or transfers;A lot of help with bathing/dressing/bathroom   Can travel by private vehicle   No    Equipment Recommendations Other (comment) (TBD)  Recommendations for Other Services       Functional Status Assessment Patient has had a recent decline in their functional status and demonstrates the ability to make significant improvements in function in a reasonable and predictable amount of time.     Precautions / Restrictions Precautions Precautions: Fall Recall of Precautions/Restrictions: Impaired Restrictions Weight Bearing Restrictions Per Provider Order: No      Mobility  Bed Mobility Overal bed mobility: Needs Assistance Bed Mobility: Supine to Sit, Sit to Supine     Supine to sit: Max assist, +2 for physical assistance, HOB elevated Sit to supine: Max assist, +2 for physical assistance   General bed mobility comments: unable to initiate moving LLE off of bed, A to complete all parts of tasks, A to lift trunk/BLE on/off bed    Transfers Overall transfer level: Needs assistance Equipment used: Rolling walker (2 wheels) Transfers: Sit to/from Stand Sit to  Stand: Max assist, +2 physical assistance, From elevated surface           General transfer comment: patient unable to come upright even with significant lifting A; once up patient unable to bear weight thru LLE    Ambulation/Gait               General Gait Details: unable  Stairs            Wheelchair Mobility     Tilt Bed    Modified Rankin (Stroke Patients Only)       Balance Overall balance assessment: Needs assistance Sitting-balance support: Feet supported Sitting balance-Leahy Scale: Fair     Standing balance support: Bilateral upper extremity supported, During functional activity, Reliant on assistive device for balance Standing balance-Leahy Scale: Zero Standing balance comment: unable to balance in standing                             Pertinent Vitals/Pain Pain Assessment Pain Assessment: Faces Faces Pain Scale: Hurts whole lot Pain Location: L leg Pain Descriptors / Indicators: Grimacing, Guarding, Discomfort, Sore Pain Intervention(s): Monitored during session, Repositioned    Home Living Family/patient expects to be discharged to:: Skilled nursing facility                 Home Equipment: Rollator (4 wheels);Shower seat - built in;Grab bars - toilet;Grab bars - tub/shower Additional Comments: info obtained from chart review and previous hospitilizations, patient very HOH and difficult to communicate with throughout  eval.    Prior Function Prior Level of Function : Independent/Modified Independent             Mobility Comments: all info obtained from previous hospitilizations: modI AMB in ALF c 4WW, manages her own bathing dressing with standby assist when showering; Has been at [facility] since July 2025 ADLs Comments: all info obtained from previous hospitilizations: family manages meds and finances; family cooks and cleans; pt can bathe and dress herself     Extremity/Trunk Assessment   Upper Extremity  Assessment Upper Extremity Assessment: Defer to OT evaluation    Lower Extremity Assessment Lower Extremity Assessment: LLE deficits/detail LLE Deficits / Details: painful L LE with all movement, unable to tolerate weight bearing on L LE with attempting to stand, RN and MD notified. LLE: Unable to fully assess due to pain LLE Coordination: decreased gross motor    Cervical / Trunk Assessment Cervical / Trunk Assessment: Normal  Communication   Communication Communication: Impaired Factors Affecting Communication: Hearing impaired;Reduced clarity of speech;Difficulty expressing self    Cognition Arousal: Lethargic Behavior During Therapy: Anxious   PT - Cognitive impairments: Difficult to assess Difficult to assess due to: Hard of hearing/deaf                       Following commands: Impaired Following commands impaired: Follows one step commands inconsistently     Cueing Cueing Techniques: Verbal cues, Gestural cues, Tactile cues, Visual cues     General Comments General comments (skin integrity, edema, etc.): patient on 3L of O2 at start of tx, 98%; decreased to baseline of 2L and patient remained 95% and higher    Exercises     Assessment/Plan    PT Assessment Patient needs continued PT services  PT Problem List Decreased strength;Decreased activity tolerance;Decreased mobility;Decreased balance;Decreased range of motion;Pain       PT Treatment Interventions DME instruction;Gait training;Functional mobility training;Therapeutic activities;Therapeutic exercise;Balance training;Patient/family education;Neuromuscular re-education    PT Goals (Current goals can be found in the Care Plan section)  Acute Rehab PT Goals Patient Stated Goal: none stated Time For Goal Achievement: 09/02/24    Frequency Min 2X/week     Co-evaluation PT/OT/SLP Co-Evaluation/Treatment: Yes Reason for Co-Treatment: For patient/therapist safety;To address functional/ADL  transfers PT goals addressed during session: Mobility/safety with mobility         AM-PAC PT 6 Clicks Mobility  Outcome Measure Help needed turning from your back to your side while in a flat bed without using bedrails?: A Lot Help needed moving from lying on your back to sitting on the side of a flat bed without using bedrails?: A Lot Help needed moving to and from a bed to a chair (including a wheelchair)?: Total Help needed standing up from a chair using your arms (e.g., wheelchair or bedside chair)?: Total Help needed to walk in hospital room?: Total Help needed climbing 3-5 steps with a railing? : Total 6 Click Score: 8    End of Session Equipment Utilized During Treatment: Gait belt Activity Tolerance: Patient limited by pain Patient left: in bed;with call bell/phone within reach;with bed alarm set Nurse Communication: Mobility status;Other (comment) (painful L LE) PT Visit Diagnosis: Other abnormalities of gait and mobility (R26.89);Muscle weakness (generalized) (M62.81);Pain Pain - Right/Left: Left Pain - part of body: Leg    Time: 9144-9087 PT Time Calculation (min) (ACUTE ONLY): 17 min   Charges:   PT Evaluation $PT Eval Moderate Complexity: 1 Mod   PT General Charges $$  ACUTE PT VISIT: 1 Visit         Gail Vendetti, PT, GCS 08/19/2024,11:22 AM

## 2024-08-19 NOTE — Evaluation (Signed)
 Occupational Therapy Evaluation Patient Details Name: Kellie Harris MRN: [id number] DOB: 05/06/1931 Today's Date: 08/19/2024   History of Present Illness   Kellie Harris is a 89 y.o. female with medical history significant for HTN, dementia, chronic HFpEF, history of cdiff, on 2L of O2 at baseline. Presented to ED for hypoxia and AMS.     Clinical Impressions Patient was seen for OT evaluation this date. Patient poor historian, essentially non verbal throughout PT/OT eval cotx; attempted bed mobility and patient self limiting due to pain (noted by grimacing/guarding) in LLE; no imaging noted and no report of recent fall. Patient required max A to transition to EOB; attempted x 2 trials to perform sit<>stand with patient guarding and once in stance, keeping LLE elevated. Returned patient to sitting EOB and returned to supine with max A. Secure chat sent to MD/RN to request imaging of LLE. Per chart review, patient was independent prior to hospitalization in November and living in ALF. Will need more assist at discharge. Patient would benefit from skilled OT services to address noted impairments and functional limitations (see below for any additional details) in order to maximize safety and independence while minimizing future risk of falls, injury, and readmission. Anticipate the need for follow up OT services upon acute hospital DC.      If plan is discharge home, recommend the following:   Two people to help with walking and/or transfers;A lot of help with bathing/dressing/bathroom;Assistance with feeding;Direct supervision/assist for medications management;Direct supervision/assist for financial management;Assist for transportation;Supervision due to cognitive status;Help with stairs or ramp for entrance     Functional Status Assessment   Patient has had a recent decline in their functional status and demonstrates the ability to make significant improvements in function in a reasonable and  predictable amount of time.     Equipment Recommendations   None recommended by OT     Recommendations for Other Services         Precautions/Restrictions   Precautions Precautions: Fall Recall of Precautions/Restrictions: Impaired Restrictions Weight Bearing Restrictions Per Provider Order: No     Mobility Bed Mobility Overal bed mobility: Needs Assistance Bed Mobility: Sit to Supine, Supine to Sit, Rolling Rolling: Max assist   Supine to sit: Max assist, +2 for physical assistance Sit to supine: Max assist, +2 for physical assistance   General bed mobility comments: unable to initiate moving LLE off of bed, A to complete all parts of tasks, A to lift trunk/BLE on/off bed    Transfers Overall transfer level: Needs assistance Equipment used: Rolling walker (2 wheels) Transfers: Sit to/from Stand Sit to Stand: Max assist, Total assist, +2 physical assistance           General transfer comment: patient unable to come upright even with significant lifting A; once up patient unable to bear weight thru LLE      Balance Overall balance assessment: Needs assistance Sitting-balance support: Bilateral upper extremity supported, Feet supported Sitting balance-Leahy Scale: Fair     Standing balance support: Reliant on assistive device for balance, Bilateral upper extremity supported Standing balance-Leahy Scale: Poor                             ADL either performed or assessed with clinical judgement   ADL Overall ADL's : Needs assistance/impaired  General ADL Comments: none demonstrated but based on impaired bed mobility and cognition impairments, assume that patient is going to require max-total A currently     Vision         Perception         Praxis         Pertinent Vitals/Pain Pain Assessment Pain Assessment: Faces Faces Pain Scale: Hurts whole lot Pain Location: L leg Pain  Descriptors / Indicators: Grimacing, Guarding Pain Intervention(s): Monitored during session, Repositioned, Other (comment) (notified MD and RN)     Extremity/Trunk Assessment Upper Extremity Assessment Upper Extremity Assessment: Generalized weakness   Lower Extremity Assessment Lower Extremity Assessment: Defer to PT evaluation       Communication Communication Communication: Impaired Factors Affecting Communication: Hearing impaired;Reduced clarity of speech;Difficulty expressing self   Cognition Arousal: Lethargic Behavior During Therapy: Anxious Cognition: History of cognitive impairments, No family/caregiver present to determine baseline, Cognition impaired, Difficult to assess Difficult to assess due to: Impaired communication Orientation impairments: Place, Time, Situation Awareness: Intellectual awareness impaired Memory impairment (select all impairments): Short-term memory, Working civil service fast streamer, Non-declarative long-term memory, [employer] functioning impairment (select all impairments): Initiation                   Following commands: Impaired Following commands impaired: Follows one step commands inconsistently     Cueing  General Comments   Cueing Techniques: Verbal cues;Gestural cues;Tactile cues;Visual cues  patient on 3L of O2 at start of tx, 98%; decreased to baseline of 2L and patient remained 95% and higher   Exercises     Shoulder Instructions      Home Living Family/patient expects to be discharged to:: Assisted living                             Home Equipment: Rollator (4 wheels);Shower seat - built in;Grab bars - toilet;Grab bars - tub/shower   Additional Comments: info obtained from chart review and previous hospitilizations, patient non verbal throughout eval      Prior Functioning/Environment Prior Level of Function : Independent/Modified Independent             Mobility Comments:  patient non verbal throughout eval, all info obtained from previous hospitilizations: modI AMB in ALF c 4WW, manages her own bathing dressing with standby assist when showering; Has been at [facility] since July 2025 ADLs Comments: patient non verbal throughout eval, all info obtained from previous hospitilizations: family manages meds and finances; family cooks and cleans; pt can bathe and dress herself    OT Problem List: Decreased strength;Decreased activity tolerance;Impaired balance (sitting and/or standing);Decreased cognition;Decreased safety awareness;Decreased knowledge of use of DME or AE;Pain   OT Treatment/Interventions: Self-care/ADL training;Therapeutic exercise;Energy conservation;DME and/or AE instruction;Cognitive remediation/compensation;Therapeutic activities;Patient/family education;Balance training      OT Goals(Current goals can be found in the care plan section)   Acute Rehab OT Goals Patient Stated Goal: none OT Goal Formulation: Patient unable to participate in goal setting Time For Goal Achievement: 09/02/24 Potential to Achieve Goals: Fair ADL Goals Pt Will Perform Grooming: with min assist;sitting Pt Will Perform Lower Body Dressing: with min assist;sit to/from stand Pt Will Transfer to Toilet: with mod assist;ambulating;bedside commode   OT Frequency:  Min 2X/week    Co-evaluation              AM-PAC OT 6 Clicks Daily Activity     Outcome Measure Help from another person  eating meals?: A Little Help from another person taking care of personal grooming?: A Little Help from another person toileting, which includes using toliet, bedpan, or urinal?: A Lot Help from another person bathing (including washing, rinsing, drying)?: A Lot Help from another person to put on and taking off regular upper body clothing?: A Lot Help from another person to put on and taking off regular lower body clothing?: A Lot 6 Click Score: 14   End of Session Equipment  Utilized During Treatment: Gait belt;Rolling walker (2 wheels);Oxygen Nurse Communication: Mobility status;Other (comment) (pain/limited mobility)  Activity Tolerance: Patient limited by pain Patient left: in bed;with call bell/phone within reach;with bed alarm set  OT Visit Diagnosis: Unsteadiness on feet (R26.81);Muscle weakness (generalized) (M62.81);History of falling (Z91.81)                Time: 9144-9087 OT Time Calculation (min): 17 min Charges:  OT General Charges $OT Visit: 1 Visit OT Evaluation $OT Eval Low Complexity: 1 Low  Rogers Clause, OT/L MSOT, 08/19/2024

## 2024-08-19 NOTE — Plan of Care (Signed)

## 2024-08-19 NOTE — Care Management Obs Status (Signed)
 MEDICARE OBSERVATION STATUS NOTIFICATION   Patient Details  Name: Kellie Harris MRN: [id number] Date of Birth: [birth year]   Medicare Observation Status Notification Given:  Yes    Gage Treiber W, CMA 08/19/2024, 2:31 PM

## 2024-08-19 NOTE — Evaluation (Signed)
 Clinical/Bedside Swallow Evaluation Patient Details  Name: Kellie Harris MRN: [id number] Date of Birth: 1931-05-05  Today's Date: 08/19/2024 Time: SLP Start Time (ACUTE ONLY): 0845 SLP Stop Time (ACUTE ONLY): 0945 SLP Time Calculation (min) (ACUTE ONLY): 60 min  Past Medical History:  Past Medical History:  Diagnosis Date   Arthritis    right knee, left shoulder/ OSTEO   Cancer (HCC)    skin    Degenerative disc disease, lumbar    L4-L5   Diverticulosis    ITIS   History of hiatal hernia    Hydronephrosis    right   Hypercholesteremia    Hypertension    CONTROLLED ON MEDS   IBS (irritable bowel syndrome)    Knee fracture, right 03/05/2015   DIFFICULTY WITH AMBULATION   Osteopenia    Ovarian cyst    Pancreatic insufficiency    Stroke [facility]) 2025   unsure if TIA or stroke, no deficits   Wears hearing aid    bilateral   Past Surgical History:  Past Surgical History:  Procedure Laterality Date   CATARACT EXTRACTION W/PHACO Right 11/02/2015   Procedure: CATARACT EXTRACTION PHACO AND INTRAOCULAR LENS PLACEMENT (IOC);  Surgeon: Dene Etienne, MD;  Location: [facility];  Service: Ophthalmology;  Laterality: Right;   CATARACT EXTRACTION W/PHACO Left 07/18/2016   Procedure: CATARACT EXTRACTION PHACO AND INTRAOCULAR LENS PLACEMENT (IOC);  Surgeon: Dene Etienne, MD;  Location: [facility] SUR[facility]ERY [facility];  Service: Ophthalmology;  Laterality: Left;   COLONOSCOPY     ESOPHAGOGASTRODUODENOSCOPY N/A 02/14/2024   Procedure: EGD (ESOPHAGOGASTRODUODENOSCOPY);  Surgeon: Maryruth Ole DASEN, MD;  Location: [facility] ENDOSCOPY;  Service: Endoscopy;  Laterality: N/A;   FRACTURE SURGERY Left    hip   HARDWARE REMOVAL Left 09/04/2021   Procedure: HARDWARE REMOVAL;  Surgeon: Leora Lynwood SAUNDERS, MD;  Location: [facility] ORS;  Service: Orthopedics;  Laterality: Left;   history of left hip fracture     INTRAMEDULLARY (IM) NAIL INTERTROCHANTERIC Left 02/12/2017   Procedure: INTRAMEDULLARY (IM) NAIL  INTERTROCHANTRIC;  Surgeon: Cleotilde Barrio, MD;  Location: [facility] ORS;  Service: Orthopedics;  Laterality: Left;   kidney stent     OOPHORECTOMY Bilateral 2015   ORIF WRIST FRACTURE Right 04/08/2018   Procedure: OPEN REDUCTION INTERNAL FIXATION (ORIF) WRIST FRACTURE;  Surgeon: Celena Sharper, MD;  Location: [facility] OR;  Service: Orthopedics;  Laterality: Right;   TONSILLECTOMY     TOTAL HIP ARTHROPLASTY Left 09/04/2021   Procedure: TOTAL HIP ARTHROPLASTY ANTERIOR APPROACH;  Surgeon: Leora Lynwood SAUNDERS, MD;  Location: [facility] ORS;  Service: Orthopedics;  Laterality: Left;   TOTAL KNEE ARTHROPLASTY Right 08/07/2017   Procedure: TOTAL KNEE ARTHROPLASTY;  Surgeon: Cleotilde Barrio, MD;  Location: [facility] ORS;  Service: Orthopedics;  Laterality: Right;   HPI:  Pt is a 89 y.o. female with medical history significant for Dementia per MD note, HTN, UTI, chronic HFpEF, history of C. difficile in November 2025, when she was hospitalized for acute dyspnea, attributed to CHF exacerbation, Malnurishment, discharged on O2 at 2 L, being admitted from ALF with a UTI.  Pt has known Esophageal phase Dysmotility per chart. She was originally sent to the ED due to concerns for shortness of breath and hypoxia to 84% on her 3 L in the setting of 1 week history of increasing confusion.  O2 sats remained normal in the mid 90s with EMS.   CXR: Opacity in the left lung base, likely representing atelectasis or  infiltration with possible small left pleural effusion, similar to previous  study.  2. Probable emphysematous changes in the lungs.   Per Chart- EGD 02/2024: Dilation in the upper third of the esophagus and in the middle third of the esophagus. - Food in the lower third of the esophagus. - Benign- appearing, Moderate esophageal stenosis -- Dilation performed by GI.     Assessment / Plan / Recommendation  Clinical Impression   Pt seen for BSE this morning during breakfast meal. pt awake, sitting up in bed w/ breakfast tray in front of her  but not eating it. She appeared min confused/delayed in initiation of her tasks but once started, she could feed herself and answer general questions re: self. HOH. pt has Dementia at Baseline; BMI 16.42 per chart. Noted Recent EGD in 02/2024 identifying Esophageal phase Dysmotility -- ANY Esophageal Dysmotility can increase risk for REFLUX aspiration. On  O2 support 2-3L; afebrile.   Pt appears to present w/ functional oropharyngeal phase swallowing w/ No overt oropharyngeal phase dysphagia noted, No neuromuscular deficits noted. Pt consumed po trials w/ No overt, clinical s/s of aspiration during the po trials.  Pt appears at reduced risk for aspiration when following general aspiration precautions and using a slightly modified diet consistency of cut/chopped foods moistened well for cohesion. However, pt does have challenging factors that could impact her oropharyngeal swallowing to include Dementia/Cognitive decline- ANY Cognitive decline can impact overall awareness/timing of swallow and safety during po tasks which increases risk for aspiration, deconditioning/weakness, Esophageal phase Dysmotility; feeding dependency/support at meals; HOH; and advanced Age. These factors can increase risk for dysphagia as well as decreased oral intake overall.   During po trials, pt consumed consistencies presented w/ no overt coughing, decline in vocal quality, or change in respiratory presentation during/post trials. O2 sats remained 98% during. Oral phase appeared grossly [facility] w/ timely bolus management, mastication, and control of bolus propulsion for A-P transfer for swallowing. Min increased Time required for full mastication and bolus clearing of the increased textured trials(solids). Oral clearing achieved w/ all trial consistencies -- moistened, cohesive, soft foods given.  OM Exam was cursory but appeared [facility] w/ no unilateral weakness noted. Speech Clear. Pt is HOH.  Pt fed self w/ FULL setup support. REFLUX  precautions followed- HOB elevated post meal items.  Recommend a more Mech Soft consistency diet w/ well-Cut/chopped meats, moistened foods for cohesion; Thin liquids -- carefully monitor straw use, and pt should Hold Cup when drinking. Recommend general aspiration precautions to include small bites/sips Slowly, sit fully upright for oral intake, tray setup and Supervision during meals, reduce distractions. REFLUX precautions. Pills CRUSHED in Puree for safer, easier swallowing -- pt has done this w/ NSG w/ ease, and it is encouraged now and for D/C. Feeding assistance as needed. Education given on Pills in Puree; food consistencies and easy to eat options/prep; general aspiration and REFLUX  precautions to pt and NSG. Pt appears at her baseline re: swallowing w/ no Acute needs at this time. MD to reconsult if any new needs arise. NSG updated, agreed. MD updated. Recommend Dietician f/u for support; Palliative Care f/u for GOC/support. Precautions posted in chart, room.  SLP Visit Diagnosis: Dysphagia, unspecified (R13.10) (impact from Dementia/Cognitive decline; Esophageal phase Dysmotility; feeding dependency/support at meals; HOH; deconditioning)    Aspiration Risk   (reduced when following general precautions)    Diet Recommendation   Thin;Dysphagia 3 (mechanical soft) (gravies to moisten foods well) = a more Mech Soft consistency diet w/ well-Cut/chopped meats, moistened foods for cohesion; Thin liquids -- carefully monitor straw  use, and pt should Hold Cup when drinking. Recommend general aspiration precautions to include small bites/sips Slowly, sit fully upright for oral intake, tray setup and Supervision during meals, reduce distractions. REFLUX precautions. Feeding Assistance as needed.  Medication Administration: Crushed with puree (for safer swallowing moving forward d/t Cognitive decline)    Other Recommendations Recommended Consults:  (Dietician; Palliative Care) Oral Care  Recommendations: Oral care BID;Staff/trained caregiver to provide oral care     Swallow Evaluation Recommendations  See above   Assistance Recommended at Discharge  FULL at meals  Functional Status Assessment Patient has not had a recent decline in their functional status  Frequency and Duration  (n/a)   (n/a)       Prognosis Prognosis for improved oropharyngeal function: Fair Barriers to Reach Goals: Cognitive deficits;Time post onset;Severity of deficits;Motivation Barriers/Prognosis Comment: impact from Dementia/Cognitive decline; Esophageal phase Dysmotility; feeding dependency/support at meals; HOH; deconditioning      Swallow Study   General Date of Onset: 08/17/24 HPI: Pt is a 89 y.o. female with medical history significant for Dementia, HTN, UTI, chronic HFpEF, history of C. difficile in November 2025, when she was hospitalized for acute dyspnea, attributed to CHF exacerbation, Malnurishment, discharged on O2 at 2 L, being admitted from ALF with a UTI.  Pt has known Esophageal phase Dysmotility per chart. She was originally sent to the ED due to concerns for shortness of breath and hypoxia to 84% on her 3 L in the setting of 1 week history of increasing confusion.  O2 sats remained normal in the mid 90s with EMS.   CXR: Opacity in the left lung base, likely representing atelectasis or  infiltration with possible small left pleural effusion, similar to previous  study.  2. Probable emphysematous changes in the lungs.  Per Chart- EGD 02/2024: Dilation in the upper third of the esophagus and in the middle third of the esophagus. - Food in the lower third of the esophagus. - Benign- appearing, Moderate esophageal stenosis -- Dilation performed by GI. Type of Study: Bedside Swallow Evaluation Previous Swallow Assessment: none for swallowing Diet Prior to this Study: Regular;Thin liquids (Level 0) Temperature Spikes Noted: No (wbc 11.8) Respiratory Status: Nasal cannula (2-3L) History of  Recent Intubation: No Behavior/Cognition: Alert;Cooperative;Pleasant mood;Confused;Distractible;Requires cueing (baseline Dementia) Oral Cavity Assessment: Within Functional Limits Oral Care Completed by SLP: Recent completion by staff Oral Cavity - Dentition: Adequate natural dentition Vision: Functional for self-feeding Self-Feeding Abilities: Able to feed self;Needs assist;Needs set up;Total assist (pt held cup to drink) Patient Positioning: Upright in bed (fully assisted) Baseline Vocal Quality: Low vocal intensity Volitional Cough: Cognitively unable to elicit Volitional Swallow: Unable to elicit    Oral/Motor/Sensory Function Overall Oral Motor/Sensory Function: Within functional limits (no unilateral weakness during bolus management)   Ice Chips Ice chips: Within functional limits Presentation: Spoon (fed; 2 trials)   Thin Liquid Thin Liquid: Within functional limits Presentation: Cup;Self Fed;Straw (~4 ozs via straw; 9-10 boluses via cup) Other Comments: water, juice, ensure    Nectar Thick Nectar Thick Liquid: Not tested   Honey Thick Honey Thick Liquid: Not tested   Puree Puree: Within functional limits Presentation: Spoon (fed; 10 trials)   Solid     Solid:  (min increased Time but [facility]) Presentation: Spoon (fed; 4 trials) Other Comments: moistened well        Comer Portugal, MS, CCC-SLP Speech Language Pathologist Rehab Services; [facility] [place] [phone] (ascom) Jaaron Oleson 08/19/2024,4:26 PM

## 2024-08-20 ENCOUNTER — Other Ambulatory Visit: Payer: Self-pay

## 2024-08-20 DIAGNOSIS — N3001 Acute cystitis with hematuria: Secondary | ICD-10-CM

## 2024-08-20 LAB — PHOSPHORUS: Phosphorus: 3.4 mg/dL (ref 2.5–4.6)

## 2024-08-20 LAB — BASIC METABOLIC PANEL WITH GFR
Anion gap: 12 (ref 5–15)
BUN: 29 mg/dL — ABNORMAL HIGH (ref 8–23)
CO2: 23 mmol/L (ref 22–32)
Calcium: 9.2 mg/dL (ref 8.9–10.3)
Chloride: 101 mmol/L (ref 98–111)
Creatinine, Ser: 1.22 mg/dL — ABNORMAL HIGH (ref 0.44–1.00)
GFR, Estimated: 42 mL/min — ABNORMAL LOW
Glucose, Bld: 98 mg/dL (ref 70–99)
Potassium: 3.9 mmol/L (ref 3.5–5.1)
Sodium: 136 mmol/L (ref 135–145)

## 2024-08-20 LAB — CBC WITH DIFFERENTIAL/PLATELET
Abs Immature Granulocytes: 0.05 K/uL (ref 0.00–0.07)
Basophils Absolute: 0 K/uL (ref 0.0–0.1)
Basophils Relative: 0 %
Eosinophils Absolute: 0.1 K/uL (ref 0.0–0.5)
Eosinophils Relative: 1 %
HCT: 35.3 % — ABNORMAL LOW (ref 36.0–46.0)
Hemoglobin: 11.2 g/dL — ABNORMAL LOW (ref 12.0–15.0)
Immature Granulocytes: 1 %
Lymphocytes Relative: 5 %
Lymphs Abs: 0.5 K/uL — ABNORMAL LOW (ref 0.7–4.0)
MCH: 29.4 pg (ref 26.0–34.0)
MCHC: 31.7 g/dL (ref 30.0–36.0)
MCV: 92.7 fL (ref 80.0–100.0)
Monocytes Absolute: 1 K/uL (ref 0.1–1.0)
Monocytes Relative: 10 %
Neutro Abs: 8.1 K/uL — ABNORMAL HIGH (ref 1.7–7.7)
Neutrophils Relative %: 83 %
Platelets: 204 K/uL (ref 150–400)
RBC: 3.81 MIL/uL — ABNORMAL LOW (ref 3.87–5.11)
RDW: 15.6 % — ABNORMAL HIGH (ref 11.5–15.5)
WBC: 9.7 K/uL (ref 4.0–10.5)
nRBC: 0 % (ref 0.0–0.2)

## 2024-08-20 LAB — URINE CULTURE: Culture: >=100000 — AB

## 2024-08-20 LAB — PRO BRAIN NATRIURETIC PEPTIDE: Pro Brain Natriuretic Peptide: 2702 pg/mL — ABNORMAL HIGH

## 2024-08-20 LAB — MAGNESIUM: Magnesium: 2.1 mg/dL (ref 1.7–2.4)

## 2024-08-20 LAB — GLUCOSE, CAPILLARY: Glucose-Capillary: 125 mg/dL — ABNORMAL HIGH (ref 70–99)

## 2024-08-20 MED ORDER — FUROSEMIDE 20 MG PO TABS: 20.0000 mg | ORAL_TABLET | ORAL | Status: DC

## 2024-08-20 MED ORDER — SULFAMETHOXAZOLE-TRIMETHOPRIM 400-80 MG PO TABS
1.0000 | ORAL_TABLET | Freq: Two times a day (BID) | ORAL | Status: AC
  Administered 2024-08-21 – 2024-08-22 (×4): 1 via ORAL
  Filled 2024-08-20 (×4): qty 1

## 2024-08-20 MED ORDER — CEPHALEXIN 500 MG PO CAPS: 500.0000 mg | ORAL_CAPSULE | Freq: Two times a day (BID) | ORAL | Status: DC

## 2024-08-20 MED ORDER — SODIUM CHLORIDE 0.9 % IV SOLN
1.0000 g | INTRAVENOUS | Status: AC
  Administered 2024-08-20: 1 g via INTRAVENOUS
  Filled 2024-08-20: qty 10

## 2024-08-20 NOTE — Progress Notes (Signed)
 Occupational Therapy Treatment Patient Details Name: Kellie Harris MRN: [id number] DOB: 1931-02-20 Today's Date: 08/20/2024   History of present illness Kellie Harris is a 89 y.o. female with medical history significant for HTN, dementia, chronic HFpEF, history of cdiff, on 2L of O2 at baseline. Presented to ED for hypoxia and AMS. Diagnosis of UTI.   OT comments  Patient seen for OT/PT treatment on this date. Upon arrival to room patient resting in bed, able to answer questions appropriately (when she can hear them), seems clearer today and less confused. She did not complain of any pain and was able to initiate all parts of bed mobility/transfers however she continues to require physical A due to weakness/deconditioning; She was able to ambulate ~5 feet with slow pace and A using r/w.  Patient ended treatment in recliner with bed/chair alarm on and all needs within reach. Patient making good progress toward goals, will continue to follow POC. Discharge recommendation remains appropriate.        If plan is discharge home, recommend the following:  A lot of help with bathing/dressing/bathroom;Assistance with feeding;Direct supervision/assist for medications management;Direct supervision/assist for financial management;Assist for transportation;Supervision due to cognitive status;Help with stairs or ramp for entrance;A lot of help with walking and/or transfers   Equipment Recommendations  None recommended by OT    Recommendations for Other Services      Precautions / Restrictions Precautions Precautions: Fall Recall of Precautions/Restrictions: Impaired Restrictions Weight Bearing Restrictions Per Provider Order: No       Mobility Bed Mobility Overal bed mobility: Needs Assistance Bed Mobility: Supine to Sit     Supine to sit: Mod assist, Max assist     General bed mobility comments: intiiated moving BLE off to EOB, requierd A to lift trunk and scoot to EOB    Transfers Overall  transfer level: Needs assistance Equipment used: Rolling walker (2 wheels) Transfers: Sit to/from Stand Sit to Stand: Mod assist, +2 physical assistance           General transfer comment: improved task initiation with sit<>stand, requires lifting A and hand placement for transfers     Balance Overall balance assessment: Needs assistance Sitting-balance support: Feet supported Sitting balance-Leahy Scale: Fair     Standing balance support: Bilateral upper extremity supported, During functional activity, Reliant on assistive device for balance Standing balance-Leahy Scale: Poor                             ADL either performed or assessed with clinical judgement   ADL Overall ADL's : Needs assistance/impaired                                       General ADL Comments: patient able to initiate tasks (combing hair/washing face) but requires A to complete tasks    Extremity/Trunk Assessment Upper Extremity Assessment Upper Extremity Assessment: Generalized weakness   Lower Extremity Assessment Lower Extremity Assessment: Generalized weakness        Vision       Perception     Praxis     Communication Communication Communication: Impaired Factors Affecting Communication: Hearing impaired;Reduced clarity of speech;Difficulty expressing self   Cognition Arousal: Alert Behavior During Therapy: WFL for tasks assessed/performed Cognition: History of cognitive impairments, No family/caregiver present to determine baseline, Cognition impaired, Difficult to assess Difficult to assess due to: Impaired communication,  Hard of hearing/deaf   Awareness: Intellectual awareness impaired Memory impairment (select all impairments): Short-term memory, Working civil service fast streamer, Non-declarative long-term memory, [employer] Officer functioning impairment (select all impairments): Organization, Sequencing, Reasoning, Problem solving                    Following commands: Impaired Following commands impaired: Follows one step commands inconsistently      Cueing   Cueing Techniques: Verbal cues, Gestural cues, Tactile cues, Visual cues  Exercises      Shoulder Instructions       General Comments      Pertinent Vitals/ Pain       Pain Assessment Pain Assessment: No/denies pain  Home Living                                          Prior Functioning/Environment              Frequency  Min 2X/week        Progress Toward Goals  OT Goals(current goals can now be found in the care plan section)  Progress towards OT goals: Progressing toward goals  Acute Rehab OT Goals Patient Stated Goal: none stated OT Goal Formulation: Patient unable to participate in goal setting Time For Goal Achievement: 09/02/24 Potential to Achieve Goals: Fair ADL Goals Pt Will Perform Grooming: with min assist;sitting Pt Will Perform Lower Body Dressing: with min assist;sit to/from stand Pt Will Transfer to Toilet: with mod assist;ambulating;bedside commode  Plan      Co-evaluation    PT/OT/SLP Co-Evaluation/Treatment: Yes Reason for Co-Treatment: For patient/therapist safety;To address functional/ADL transfers PT goals addressed during session: Mobility/safety with mobility OT goals addressed during session: ADL's and self-care      AM-PAC OT 6 Clicks Daily Activity     Outcome Measure   Help from another person eating meals?: A Little Help from another person taking care of personal grooming?: A Little Help from another person toileting, which includes using toliet, bedpan, or urinal?: A Lot Help from another person bathing (including washing, rinsing, drying)?: A Lot Help from another person to put on and taking off regular upper body clothing?: A Lot Help from another person to put on and taking off regular lower body clothing?: A Lot 6 Click Score: 14    End of Session Equipment Utilized  During Treatment: Gait belt;Rolling walker (2 wheels);Oxygen  OT Visit Diagnosis: Unsteadiness on feet (R26.81);Muscle weakness (generalized) (M62.81);History of falling (Z91.81)   Activity Tolerance Patient limited by fatigue   Patient Left in chair;with call bell/phone within reach;with chair alarm set   Nurse Communication Mobility status;Other (comment)        Time: 8861-8794 OT Time Calculation (min): 27 min  Charges: OT General Charges $OT Visit: 1 Visit OT Treatments $Self Care/Home Management : 8-22 mins  Rogers Clause, OT/L MSOT, 08/20/2024

## 2024-08-20 NOTE — Progress Notes (Signed)
 Patient presents with O2 saturation 87% on RA supine and seated edge of bed; seated upright in bed 1L O2 saturation 97%.  Dr Franchot notified of O2 saturation and patient refusing ambulation due to left knee discomfort.  Patient does require Max assist for bed mobility.

## 2024-08-20 NOTE — Progress Notes (Addendum)
 " Progress Note   Patient: Kellie Harris FMW:[id number] DOB: [birth year] DOA: 08/17/2024     0 DOS: the patient was seen and examined on 08/20/2024   Brief hospital course: Per H&P HPI   Kellie Harris is a 89 y.o. female with medical history significant for HTN, dementia, chronic HFpEF, history of C. difficile in November 2025, when she was hospitalized for acute dyspnea, attributed to CHF exacerbation, discharged on O2 at 2 L, being admitted from ALF with a UTI.  She was originally sent to the ED due to concerns for shortness of breath and hypoxia to 84% on her 3 L in the setting of 1 week history of increasing confusion.  O2 sats remained normal in the mid 90s with EMS so they attributed the initial low sats to the length of the O2 tubing at the facility. Upon arrival, remained with sats in the high 90s on 3 L with otherwise normal vitals. Labs showed UA with small leuks and a few bacteria.  CBC and BMP normal albeit with a slightly elevated anion gap of 16.EKG showed sinus at 76 and chest x-ray showed Opacity in the left lung base, likely representing atelectasis or infiltration with possible small left pleural effusion, similar to previous study  Assessment and Plan: * Urinary tract infection Rocephin  Ucx with klebsiella sensitive to cephalosporins Will transition to PO antibiotics in the AM    Acute metabolic encephalopathy Dementia Unclear if back to baseline.  Difficult to assess given patient is very hard of hearing. Related to urinary tract infection versus lower respiratory tract infection Delirium precautions - Continue antibiotics per above - Will discuss with family if patient is back to baseline mentation   Chronic respiratory failure with hypoxia (HCC) Initially hypoxic on home 3L of O2. Reportedly with cough as well. CXR with infiltrate v atelectasis in LLL. Patient with O2 sats in the high 90s on 3L O2. Procal 0.18. RPP neg.  Currently on ceftriaxone  for UTI -Continue  antibiotics -Flutter valve -F/u Repeat cxr  -Wean oxygen as able.  Continue to monitor  Hypokalemia  Replace PRN   AKI Possibly due to overdiuresis Will change lasix  to every other day dosing Will hold ARB for now   Chronic heart failure with preserved ejection fraction (HFpEF) (HCC) Clinically euvolemic, BNP elevated  Hold ARB given AKI  Continue coreg , change lasix  to every other day Continue home GDMT, carvedilol  -Check REDs   Seizure disorder (HCC) Continue lamotrigine   History of CVA (cerebrovascular accident) No acute issues Continue home meds  Essential hypertension Continue home meds  History of Clostridium difficile infection 06/2024 No acute issues suspected  L hip pain  S/p L hip replacement 2 years ago  S/p R knee replacement  Noted to have this since before Christmas time. She had an Xray at her facility which was negative for fracture per patient's daughter. Per therapy teams patient complained of L hip pain when working with patient.  X ray with no acute abnormality  Will continue with PT/OT  Pain control      Subjective: Very hard of hearing. No issues overnight.   Physical Exam: Vitals:   08/19/24 1618 08/19/24 1938 08/20/24 0422 08/20/24 0824  BP: (!) 157/80 (!) 162/91 (!) 146/62 (!) 155/63  Pulse: 92 79 71 71  Resp: 15 15 15 20   Temp: 99.6 F (37.6 C) 98 F (36.7 C) 98.2 F (36.8 C) 97.9 F (36.6 C)  TempSrc: Oral  Oral Oral  SpO2: 98% 99% 99%  100%  Weight:      Height:       Physical Exam  Constitutional: In no distress.  Cardiovascular: Normal rate, regular rhythm. No lower extremity edema  Pulmonary: Non labored breathing on [place], no wheezing or rales.   Abdominal: Soft. Non distended and non tender Musculoskeletal: Normal range of motion.     Neurological: Alert and oriented to person, place, Non focal. Very HOH  Skin: Skin is warm and [place].    Data Reviewed:     Latest Ref Rng & Units 08/20/2024    7:46 AM 08/19/2024     4:06 AM 08/18/2024    9:51 AM  BMP  Glucose 70 - 99 mg/dL 98  82  84   BUN 8 - 23 mg/dL 29  20  16    Creatinine 0.44 - 1.00 mg/dL 8.77  8.88  9.02   Sodium 135 - 145 mmol/L 136  139  139   Potassium 3.5 - 5.1 mmol/L 3.9  3.1  3.9   Chloride 98 - 111 mmol/L 101  101  101   CO2 22 - 32 mmol/L 23  22  21    Calcium  8.9 - 10.3 mg/dL 9.2  8.9  9.3       Latest Ref Rng & Units 08/20/2024    7:46 AM 08/19/2024    4:06 AM 08/17/2024   10:41 PM  CBC  WBC 4.0 - 10.5 K/uL 9.7  11.8  9.4   Hemoglobin 12.0 - 15.0 g/dL 88.7  88.2  87.3   Hematocrit 36.0 - 46.0 % 35.3  36.5  39.3   Platelets 150 - 400 K/uL 204  240  234      Family Communication: Daughter 1/7   Disposition: Status is: Observation The patient remains OBS appropriate and will d/c before 2 midnights.  Planned Discharge Destination: Skilled nursing facility    Time spent: 35 minutes  Author: Alban Pepper, MD 08/20/2024 3:25 PM  For on call review [email or web address].  "

## 2024-08-20 NOTE — TOC Progression Note (Signed)
 Transition of Care [facility]) - Progression Note    Patient Details  Name: Kellie Harris MRN: [id number] Date of Birth: 10-14-1930  [facility] CM/SW Contact  Dalia GORMAN Fuse, RN Phone Number: 08/20/2024, 9:53 AM  Clinical Narrative:     TOC received a call from [facility] with [facility]. They can admit the patient today if ins shara is received.  TOC will continue to follow.  Expected Discharge Plan: Assisted Living Barriers to Discharge: No Barriers Identified               Expected Discharge Plan and Services   Discharge Planning Services: CM Consult   Living arrangements for the past 2 months: (P) Assisted Living Facility                                       Social Drivers of Health (SDOH) Interventions SDOH Screenings   Food Insecurity: No Food Insecurity (08/18/2024)  Housing: Low Risk (08/18/2024)  Transportation Needs: No Transportation Needs (08/18/2024)  Utilities: Not At Risk (08/18/2024)  Financial Resource Strain: Low Risk  (07/17/2023)   Received from [facility]  Social Connections: Socially Isolated (08/19/2024)  Tobacco Use: Low Risk (08/17/2024)    Readmission Risk Interventions     No data to display

## 2024-08-20 NOTE — Progress Notes (Signed)
 Physical Therapy Treatment Patient Details Name: Kellie Harris MRN: [id number] DOB: 09-15-1930 Today's Date: 08/20/2024   History of Present Illness Kellie Harris is a 89 y.o. female with medical history significant for HTN, dementia, chronic HFpEF, history of cdiff, on 2L of O2 at baseline. Presented to ED for hypoxia and AMS. Diagnosis of UTI.    PT Comments  Patient received in bed, she is agreeable to PT/OT session. Patient is VERY HOH and difficult to communicate with. Patient reports no pain this session. She requires mod +2 assist for bed mobility and transfers. She was able to ambulate ~8 feet with RW and min A, slow pace and encouragement needed throughout. Patient will continue to benefit from skilled PT to improve functional independence and safety.     If plan is discharge home, recommend the following: A lot of help with walking and/or transfers;A lot of help with bathing/dressing/bathroom   Can travel by private vehicle     No  Equipment Recommendations  None recommended by PT    Recommendations for Other Services       Precautions / Restrictions Precautions Precautions: Fall Recall of Precautions/Restrictions: Impaired Restrictions Weight Bearing Restrictions Per Provider Order: No     Mobility  Bed Mobility Overal bed mobility: Needs Assistance Bed Mobility: Supine to Sit     Supine to sit: Mod assist          Transfers Overall transfer level: Needs assistance Equipment used: Rolling walker (2 wheels) Transfers: Sit to/from Stand Sit to Stand: Mod assist, +2 physical assistance           General transfer comment: improved task initiation with sit<>stand, requires lifting A and hand placement for transfers, on second standing attempt patient had posterior leaning    Ambulation/Gait Ambulation/Gait assistance: Min assist Gait Distance (Feet): 8 Feet Assistive device: Rolling walker (2 wheels) Gait Pattern/deviations: Step-through pattern, Decreased  step length - right, Decreased step length - left, Decreased stride length, Shuffle, Decreased dorsiflexion - right, Decreased dorsiflexion - left Gait velocity: decr     General Gait Details: patient able to ambulate ~ 8 feet with RW and cga/min A. Chair follow for safety. Patient ambulates with very small steps and reduced foot clearance bilaterally.   Stairs             Wheelchair Mobility     Tilt Bed    Modified Rankin (Stroke Patients Only)       Balance Overall balance assessment: Needs assistance Sitting-balance support: Feet supported Sitting balance-Leahy Scale: Good     Standing balance support: Bilateral upper extremity supported, During functional activity, Reliant on assistive device for balance Standing balance-Leahy Scale: Fair Standing balance comment: patient has posterior leaning in standing                            Communication Communication Communication: Impaired Factors Affecting Communication: Hearing impaired;Reduced clarity of speech;Difficulty expressing self  Cognition Arousal: Alert Behavior During Therapy: WFL for tasks assessed/performed   PT - Cognitive impairments: Sequencing, Problem solving, Safety/Judgement, Initiation Difficult to assess due to: Hard of hearing/deaf                       Following commands: Impaired Following commands impaired: Follows one step commands inconsistently    Cueing Cueing Techniques: Verbal cues, Gestural cues  Exercises      General Comments        Pertinent Vitals/Pain  Pain Assessment Pain Assessment: No/denies pain Pain Intervention(s): Monitored during session, Repositioned    Home Living                          Prior Function            PT Goals (current goals can now be found in the care plan section) Acute Rehab PT Goals Patient Stated Goal: none stated Time For Goal Achievement: 09/02/24 Progress towards PT goals: Progressing toward  goals    Frequency    Min 2X/week      PT Plan      Co-evaluation PT/OT/SLP Co-Evaluation/Treatment: Yes Reason for Co-Treatment: For patient/therapist safety;To address functional/ADL transfers PT goals addressed during session: Mobility/safety with mobility;Balance;Proper use of DME OT goals addressed during session: ADL's and self-care      AM-PAC PT 6 Clicks Mobility   Outcome Measure  Help needed turning from your back to your side while in a flat bed without using bedrails?: A Lot Help needed moving from lying on your back to sitting on the side of a flat bed without using bedrails?: A Lot Help needed moving to and from a bed to a chair (including a wheelchair)?: A Lot Help needed standing up from a chair using your arms (e.g., wheelchair or bedside chair)?: A Lot Help needed to walk in hospital room?: A Lot Help needed climbing 3-5 steps with a railing? : Total 6 Click Score: 11    End of Session Equipment Utilized During Treatment: Gait belt Activity Tolerance: Patient limited by fatigue Patient left: in chair;with call bell/phone within reach;with chair alarm set Nurse Communication: Mobility status PT Visit Diagnosis: Other abnormalities of gait and mobility (R26.89);Muscle weakness (generalized) (M62.81);Difficulty in walking, not elsewhere classified (R26.2)     Time: 8861-8843 PT Time Calculation (min) (ACUTE ONLY): 18 min  Charges:    $Gait Training: 8-22 mins PT General Charges $$ ACUTE PT VISIT: 1 Visit                     Kellie Harris, PT, GCS 08/20/2024,12:55 PM

## 2024-08-20 NOTE — Plan of Care (Signed)

## 2024-08-20 NOTE — Consult Note (Signed)
 PHARMACY CONSULT NOTE - ELECTROLYTES  Pharmacy Consult for Electrolyte Monitoring and Replacement   Recent Labs: Height: 5' 4 (162.6 cm) Weight: 43.4 kg (95 lb 10.9 oz) IBW/kg (Calculated) : 54.7 Estimated Creatinine Clearance: 20.6 mL/min (A) (by C-G formula based on SCr of 1.22 mg/dL (H)). Potassium (mmol/L)  Date Value  08/20/2024 3.9  07/10/2014 3.8   Magnesium  (mg/dL)  Date Value  98/91/7973 2.1   Calcium  (mg/dL)  Date Value  98/91/7973 9.2   Calcium , Total (mg/dL)  Date Value  88/71/7984 10.1   Albumin (g/dL)  Date Value  88/95/7974 3.8  07/08/2014 4.2   Phosphorus (mg/dL)  Date Value  98/91/7973 3.4   Sodium (mmol/L)  Date Value  08/20/2024 136  07/10/2014 138    Assessment  Kellie Harris is a 89 y.o. female presenting with UTI. PMH significant for HTN, dementia, and HFpEF. Pharmacy has been consulted to monitor and replace electrolytes.  Diet: Heart health MIVF: N/A Pertinent medications: losartan  100 mg, furosemide  20  Goal of Therapy: Electrolytes WNL  Plan:  No replacement indicated at this time Check BMP, Mg, Phos with AM labs  Thank you for allowing pharmacy to be a part of this patient's care.  Kayla JULIANNA Blew, PharmD Clinical Pharmacist 08/20/2024 9:57 AM

## 2024-08-20 NOTE — TOC Progression Note (Signed)
 Transition of Care [facility]) - Progression Note    Patient Details  Name: Kellie Harris MRN: [id number] Date of Birth: 1931-02-22  [facility] CM/SW Contact  Grayce JAYSON Perfect, RN Phone Number: 08/20/2024, 3:28 PM  Clinical Narrative:   [employer] approved [facility] transport - [id number].  Requesting peer to peer consult for SNF approval.  Hospitalist notified and will complete before 12p tomorrow-1/9.    Expected Discharge Plan: Assisted Living Barriers to Discharge: No Barriers Identified               Expected Discharge Plan and Services   Discharge Planning Services: CM Consult   Living arrangements for the past 2 months: (P) Assisted Living Facility                                       Social Drivers of Health (SDOH) Interventions SDOH Screenings   Food Insecurity: No Food Insecurity (08/18/2024)  Housing: Low Risk (08/18/2024)  Transportation Needs: No Transportation Needs (08/18/2024)  Utilities: Not At Risk (08/18/2024)  Financial Resource Strain: Low Risk  (07/17/2023)   Received from [facility]  Social Connections: Socially Isolated (08/19/2024)  Tobacco Use: Low Risk (08/17/2024)    Readmission Risk Interventions     No data to display

## 2024-08-21 ENCOUNTER — Observation Stay

## 2024-08-21 DIAGNOSIS — N3001 Acute cystitis with hematuria: Secondary | ICD-10-CM | POA: Diagnosis not present

## 2024-08-21 LAB — MAGNESIUM: Magnesium: 2.2 mg/dL (ref 1.7–2.4)

## 2024-08-21 LAB — BASIC METABOLIC PANEL WITH GFR
Anion gap: 11 (ref 5–15)
BUN: 39 mg/dL — ABNORMAL HIGH (ref 8–23)
CO2: 22 mmol/L (ref 22–32)
Calcium: 9.4 mg/dL (ref 8.9–10.3)
Chloride: 103 mmol/L (ref 98–111)
Creatinine, Ser: 1.33 mg/dL — ABNORMAL HIGH (ref 0.44–1.00)
GFR, Estimated: 38 mL/min — ABNORMAL LOW
Glucose, Bld: 93 mg/dL (ref 70–99)
Potassium: 4 mmol/L (ref 3.5–5.1)
Sodium: 136 mmol/L (ref 135–145)

## 2024-08-21 LAB — CBC WITH DIFFERENTIAL/PLATELET
Abs Immature Granulocytes: 0.05 K/uL (ref 0.00–0.07)
Basophils Absolute: 0 K/uL (ref 0.0–0.1)
Basophils Relative: 0 %
Eosinophils Absolute: 0.2 K/uL (ref 0.0–0.5)
Eosinophils Relative: 2 %
HCT: 31.9 % — ABNORMAL LOW (ref 36.0–46.0)
Hemoglobin: 10.2 g/dL — ABNORMAL LOW (ref 12.0–15.0)
Immature Granulocytes: 1 %
Lymphocytes Relative: 5 %
Lymphs Abs: 0.5 K/uL — ABNORMAL LOW (ref 0.7–4.0)
MCH: 28.9 pg (ref 26.0–34.0)
MCHC: 32 g/dL (ref 30.0–36.0)
MCV: 90.4 fL (ref 80.0–100.0)
Monocytes Absolute: 1 K/uL (ref 0.1–1.0)
Monocytes Relative: 10 %
Neutro Abs: 7.9 K/uL — ABNORMAL HIGH (ref 1.7–7.7)
Neutrophils Relative %: 82 %
Platelets: 191 K/uL (ref 150–400)
RBC: 3.53 MIL/uL — ABNORMAL LOW (ref 3.87–5.11)
RDW: 15.4 % (ref 11.5–15.5)
WBC: 9.6 K/uL (ref 4.0–10.5)
nRBC: 0 % (ref 0.0–0.2)

## 2024-08-21 LAB — URINALYSIS, ROUTINE W REFLEX MICROSCOPIC
Bacteria, UA: NONE SEEN
Bilirubin Urine: NEGATIVE
Glucose, UA: NEGATIVE mg/dL
Hgb urine dipstick: NEGATIVE
Ketones, ur: NEGATIVE mg/dL
Leukocytes,Ua: NEGATIVE
Nitrite: NEGATIVE
Protein, ur: 30 mg/dL — AB
Specific Gravity, Urine: 1.023 (ref 1.005–1.030)
pH: 5 (ref 5.0–8.0)

## 2024-08-21 LAB — SODIUM, URINE, RANDOM: Sodium, Ur: <30 mmol/L

## 2024-08-21 LAB — PHOSPHORUS: Phosphorus: 3.2 mg/dL (ref 2.5–4.6)

## 2024-08-21 MED ORDER — SODIUM CHLORIDE 0.9 % IV BOLUS
250.0000 mL | Freq: Once | INTRAVENOUS | Status: AC
  Administered 2024-08-21: 250 mL via INTRAVENOUS

## 2024-08-21 NOTE — Progress Notes (Signed)
 " Progress Note   Patient: Kellie Harris FMW:[id number] DOB: 1930-09-15 DOA: 08/17/2024     0 DOS: the patient was seen and examined on 08/21/2024   Brief hospital course: Per H&P HPI   Kellie Harris is a 89 y.o. female with medical history significant for HTN, dementia, chronic HFpEF, history of C. difficile in November 2025, when she was hospitalized for acute dyspnea, attributed to CHF exacerbation, discharged on O2 at 2 L, being admitted from ALF with a UTI.  She was originally sent to the ED due to concerns for shortness of breath and hypoxia to 84% on her 3 L in the setting of 1 week history of increasing confusion.  O2 sats remained normal in the mid 90s with EMS so they attributed the initial low sats to the length of the O2 tubing at the facility. Upon arrival, remained with sats in the high 90s on 3 L with otherwise normal vitals. Labs showed UA with small leuks and a few bacteria.  CBC and BMP normal albeit with a slightly elevated anion gap of 16.EKG showed sinus at 76 and chest x-ray showed Opacity in the left lung base, likely representing atelectasis or infiltration with possible small left pleural effusion, similar to previous study  Assessment and Plan: * Urinary tract infection Ucx with klebsiella sensitive to cephalosporins Received Rocephin  for 3 days and will complete 2 days of Bactrim    Acute metabolic encephalopathy Dementia Unclear if back to baseline.  Difficult to assess given patient is very hard of hearing. Possibly related to urinary tract infection versus lower respiratory tract infection Delirium precautions - Continue antibiotics per above - Will discuss with family if patient is back to baseline mentation   Chronic respiratory failure with hypoxia (HCC) Initially hypoxic on home 3L of O2. Reportedly with cough as well. CXR with infiltrate v atelectasis in LLL. Patient with O2 sats in the high 90s on 3L O2. Procal 0.18. RPP neg.  Currently on ceftriaxone  for  UTI -Continue antibiotics -Flutter valve -F/u Repeat cxr  -Wean oxygen as able.  Continue to monitor  Hypokalemia  Replace PRN   Normocytic anemia  Unclear etiology. No obvious bleeding.  Check iron  panel, b12, foalte  Continue to monitor CBC daily    AKI Worse this AM. Possibly due to overdiuresis Has not received lasix  past 3d. Will continue to hold ARB for now.  -May need IV fluids.  -F/u Urine sodium   Chronic heart failure with preserved ejection fraction (HFpEF) (HCC) Clinically euvolemic,though BNP elevated  Hold ARB and lasix  given AKI  Continue coreg  -Check REDs   Seizure disorder (HCC) Continue lamotrigine   History of CVA (cerebrovascular accident) No acute issues Continue home meds  Essential hypertension Continue home meds  History of Clostridium difficile infection 06/2024 No acute issues suspected  L hip pain  S/p L hip replacement 2 years ago  S/p R knee replacement  Noted to have this since before Christmas time. She had an Xray at her facility which was negative for fracture per patient's daughter. Per therapy teams patient complained of L hip pain when working with patient.  X ray with no acute abnormality  Will continue with PT/OT  Pain control      Subjective: Very hard of hearing. L knee pain is improved.   Physical Exam: Vitals:   08/20/24 1705 08/20/24 1956 08/21/24 0331 08/21/24 0801  BP:  133/64 (!) 144/69 (!) 159/69  Pulse:  85 73 69  Resp:  16 15  16  Temp:  98 F (36.7 C) 98.7 F (37.1 C) (!) 97.4 F (36.3 C)  TempSrc:  Oral Oral   SpO2: 97% 96% 98% 98%  Weight:      Height:         Constitutional: In no distress.  Cardiovascular: Normal rate, regular rhythm. No lower extremity edema  Pulmonary: Non labored breathing on [place], no wheezing or rales.   Abdominal: Soft. Non distended and non tender Musculoskeletal: Normal range of motion.     Neurological: Alert and oriented to person, place, and time. Non focal Very  HOH Skin: Skin is warm and [place].    Data Reviewed:     Latest Ref Rng & Units 08/21/2024    5:56 AM 08/20/2024    7:46 AM 08/19/2024    4:06 AM  BMP  Glucose 70 - 99 mg/dL 93  98  82   BUN 8 - 23 mg/dL 39  29  20   Creatinine 0.44 - 1.00 mg/dL 8.66  8.77  8.88   Sodium 135 - 145 mmol/L 136  136  139   Potassium 3.5 - 5.1 mmol/L 4.0  3.9  3.1   Chloride 98 - 111 mmol/L 103  101  101   CO2 22 - 32 mmol/L 22  23  22    Calcium  8.9 - 10.3 mg/dL 9.4  9.2  8.9       Latest Ref Rng & Units 08/21/2024    5:56 AM 08/20/2024    7:46 AM 08/19/2024    4:06 AM  CBC  WBC 4.0 - 10.5 K/uL 9.6  9.7  11.8   Hemoglobin 12.0 - 15.0 g/dL 89.7  88.7  88.2   Hematocrit 36.0 - 46.0 % 31.9  35.3  36.5   Platelets 150 - 400 K/uL 191  204  240      Family Communication: Daughter 1/7   Disposition: Status is: Observation The patient remains OBS appropriate and will d/c before 2 midnights.  Planned Discharge Destination: Skilled nursing facility    Time spent: 35 minutes  Author: Alban Pepper, MD 08/21/2024 2:31 PM  For on call review [email or web address].  "

## 2024-08-21 NOTE — Progress Notes (Signed)
 Speech Language Pathology Treatment: Dysphagia  Patient Details Name: Kellie Harris MRN: [id number] DOB: 02/24/1931 Today's Date: 08/21/2024 Time: 8754-8669 SLP Time Calculation (min) (ACUTE ONLY): 45 min  Assessment / Plan / Recommendation Clinical Impression  Pt seen for ongoing assessment of toleration of diet today. Pt awake, sitting up in bed w/ lunch tray beside her untouched and not eating it. She appeared min confused stating she worried about her Mother being sick right now. She could help feed herself by holding Cup to drink via straw. HOH. Pt has Dementia at Baseline; BMI 16.42 per chart. Noted Recent EGD in 02/2024 identifying Esophageal phase Dysmotility -- ANY Esophageal Dysmotility can increase risk for REFLUX aspiration. On [place] O2 support 1-2L; afebrile.    Pt appears to present w/ functional oropharyngeal phase swallowing w/ No overt oropharyngeal phase dysphagia noted, No neuromuscular deficits noted. Pt consumed po trials w/ No overt, clinical s/s of aspiration during the po trials.  Pt appears at reduced risk for aspiration when following general aspiration precautions and using a slightly modified diet consistency of cut/chopped foods moistened well for cohesion. However, pt does have challenging factors that could impact her oropharyngeal swallowing to include Dementia/Cognitive decline- ANY Cognitive decline can impact overall awareness/timing of swallow and safety during po tasks which increases risk for aspiration, deconditioning/weakness, Esophageal phase Dysmotility; feeding dependency/support at meals; HOH; and advanced Age. These factors can increase risk for dysphagia as well as decreased oral intake overall.    During po trials of lunch meal, pt consumed consistencies presented w/ no overt coughing, decline in vocal quality, or change in respiratory presentation during/post trials. Oral phase appeared grossly [facility] w/ timely bolus management, mastication, and control of bolus  propulsion for A-P transfer for swallowing. Min increased Time required for full mastication and bolus clearing of the increased textured trials(solids). Oral clearing achieved w/ all trial consistencies -- moistened, cohesive, soft foods given.  Speech Clear. Pt is HOH.  Pt required FULL support and encouragement to feed self. Feeding Support w/ meal. REFLUX precautions followed- HOB elevated post meal items.   Recommend continue a more Mech Soft consistency diet w/ well-Cut/chopped meats, moistened foods for cohesion; Thin liquids -- carefully monitor straw use, and pt should Hold Cup when drinking. Recommend general aspiration precautions to include small bites/sips Slowly, sit fully upright for oral intake, tray setup and Supervision during meals, reduce distractions. REFLUX precautions. Pills CRUSHED in Puree for safer, easier swallowing -- pt has done this w/ NSG w/ ease, and it is encouraged now and for D/C. Feeding assistance at ALL meals d/t Cognitive decline. Education given on Pills in Puree; food consistencies and easy to eat options/prep; general aspiration and REFLUX  precautions to pt and NSG. Pt appears at her baseline re: swallowing w/ no Acute needs at this time. MD to reconsult if any new needs arise. NSG updated, agreed. MD updated. Recommend Dietician f/u for support; Palliative Care f/u for GOC/support. Precautions posted in chart, room.      HPI HPI: Pt is a 89 y.o. female with medical history significant for Dementia, HTN, UTI, chronic HFpEF, history of C. difficile in November 2025, when she was hospitalized for acute dyspnea, attributed to CHF exacerbation, Malnurishment, discharged on O2 at 2 L, being admitted from ALF with a UTI.  Pt has known Esophageal phase Dysmotility per chart. She was originally sent to the ED due to concerns for shortness of breath and hypoxia to 84% on her 3 L in the setting of  1 week history of increasing confusion.  O2 sats remained normal in the mid  90s with EMS.   CXR: Opacity in the left lung base, likely representing atelectasis or  infiltration with possible small left pleural effusion, similar to previous  study.  2. Probable emphysematous changes in the lungs.  Per Chart- EGD 02/2024: Dilation in the upper third of the esophagus and in the middle third of the esophagus. - Food in the lower third of the esophagus. - Benign- appearing, Moderate esophageal stenosis -- Dilation performed by GI.      SLP Plan  All goals met        Swallow Evaluation Recommendations   Recommendations: PO diet PO Diet Recommendation: Dysphagia 3 (Mechanical soft);Thin liquids (Level 0) (meats minced; gravies to moisten. Soups.) Liquid Administration via: Cup;Straw (monitor) Medication Administration: Crushed with puree Supervision: Staff to assist with self-feeding;Full assist for feeding;Full supervision/cueing for swallowing strategies Postural changes: Position pt fully upright for meals;Stay upright 30-60 min after meals;Out of bed for meals Oral care recommendations: Oral care BID (2x/day);Staff/trained caregiver to provide oral care Recommended consults: Consider dietitian consultation;Consider Palliative care     Recommendations    See above                 (tbd) Oral care BID;Staff/trained caregiver to provide oral care   Frequent or constant Supervision/Assistance (at meals) Dysphagia, unspecified (R13.10) (impact from Dementia/Cognitive decline; Esophageal phase Dysmotility; feeding dependency/support at meals; HOH; deconditioning and lack of desire for po intake; Advanced Age)     All goals met       Comer Portugal, MS, CCC-SLP Speech Language Pathologist Rehab Services; [facility] [phone] (ascom) Naziah Weckerly  08/21/2024, 3:19 PM

## 2024-08-21 NOTE — Consult Note (Signed)
 PHARMACY CONSULT NOTE - ELECTROLYTES  Pharmacy Consult for Electrolyte Monitoring and Replacement   Recent Labs: Height: 5' 4 (162.6 cm) Weight: 43.4 kg (95 lb 10.9 oz) IBW/kg (Calculated) : 54.7 Estimated Creatinine Clearance: 18.9 mL/min (A) (by C-G formula based on SCr of 1.33 mg/dL (H)). Potassium (mmol/L)  Date Value  08/21/2024 4.0  07/10/2014 3.8   Magnesium  (mg/dL)  Date Value  98/90/7973 2.2   Calcium  (mg/dL)  Date Value  98/90/7973 9.4   Calcium , Total (mg/dL)  Date Value  88/71/7984 10.1   Albumin (g/dL)  Date Value  88/95/7974 3.8  07/08/2014 4.2   Phosphorus (mg/dL)  Date Value  98/90/7973 3.2   Sodium (mmol/L)  Date Value  08/21/2024 136  07/10/2014 138    Assessment  Kellie Harris is a 89 y.o. female presenting with UTI. PMH significant for HTN, dementia, and HFpEF. Pharmacy has been consulted to monitor and replace electrolytes.  Diet: Heart health MIVF: N/A Pertinent medications: losartan  100 mg, furosemide  20  Goal of Therapy: Electrolytes WNL  Plan:  No replacement indicated at this time Check BMP with AM labs  Thank you for allowing pharmacy to be a part of this patient's care.  Kellie Harris, PharmD Clinical Pharmacist 08/21/2024 7:26 AM

## 2024-08-21 NOTE — Progress Notes (Signed)
 Occupational Therapy Treatment Patient Details Name: Kellie Harris MRN: [id number] DOB: 1930/11/06 Today's Date: 08/21/2024   History of present illness Kellie Harris is a 89 y.o. female with medical history significant for HTN, dementia, chronic HFpEF, history of cdiff, on 2L of O2 at baseline. Presented to ED for hypoxia and AMS. Diagnosis of UTI.   OT comments  Patient seen for OT treatment on this date. Upon arrival to room patient in bed, scooted down and trying to feed self. Board states patient needs to be upright for meals. Max A to transition to EOB; mod-max A for SPT from EOB to recliner with multimodal cues, increased time and delayed processing speed for transfer technique. OT notified NT that patient will likely require A to feed breakfast. OT set up tray and placed in front of patient with encouragement provided. Patient ended treatment in recliner with bed/chair alarm on and all needs within reach. Patient making fair progress toward goals, will continue to follow POC. Discharge recommendation remains appropriate.        If plan is discharge home, recommend the following:  A lot of help with bathing/dressing/bathroom;Assistance with feeding;Direct supervision/assist for medications management;Direct supervision/assist for financial management;Assist for transportation;Supervision due to cognitive status;Help with stairs or ramp for entrance;A lot of help with walking and/or transfers   Equipment Recommendations  None recommended by OT    Recommendations for Other Services      Precautions / Restrictions Precautions Precautions: Fall Recall of Precautions/Restrictions: Impaired Restrictions Weight Bearing Restrictions Per Provider Order: No       Mobility Bed Mobility Overal bed mobility: Needs Assistance Bed Mobility: Supine to Sit     Supine to sit: Max assist     General bed mobility comments: intiiated moving BLE off to EOB, requierd A to lift trunk and scoot to  EOB    Transfers Overall transfer level: Needs assistance Equipment used: Rolling walker (2 wheels) Transfers: Bed to chair/wheelchair/BSC       Step pivot transfers: Mod assist, Max assist     General transfer comment: max cues for sequencing/steadying A while in stance     Balance Overall balance assessment: Needs assistance Sitting-balance support: Feet supported Sitting balance-Leahy Scale: Good     Standing balance support: Bilateral upper extremity supported, During functional activity, Reliant on assistive device for balance Standing balance-Leahy Scale: Fair Standing balance comment: patient has posterior leaning in standing                           ADL either performed or assessed with clinical judgement   ADL Overall ADL's : Needs assistance/impaired                                       General ADL Comments: max A to adjust gown and socks, patient not initiating any tasks and requires max cues/coaxing    Extremity/Trunk Assessment              Vision       Perception     Praxis     Communication Communication Communication: Impaired Factors Affecting Communication: Hearing impaired;Reduced clarity of speech;Difficulty expressing self   Cognition Arousal: Alert Behavior During Therapy: WFL for tasks assessed/performed Cognition: History of cognitive impairments, No family/caregiver present to determine baseline, Cognition impaired, Difficult to assess Difficult to assess due to: Impaired communication, Hard of hearing/deaf  Orientation impairments: Place, Time, Situation Awareness: Intellectual awareness impaired Memory impairment (select all impairments): Short-term memory, Working civil service fast streamer, Copywriter, advertising, Armed [employer] Training [employer] Education Officer functioning impairment (select all impairments): Organization, Sequencing, Reasoning, Problem solving                   Following commands:  Impaired Following commands impaired: Follows one step commands inconsistently      Cueing   Cueing Techniques: Verbal cues, Gestural cues  Exercises      Shoulder Instructions       General Comments      Pertinent Vitals/ Pain       Pain Assessment Pain Assessment: No/denies pain  Home Living Family/patient expects to be discharged to:: Skilled nursing facility                             Home Equipment: Rollator (4 wheels);Shower seat - built in;Grab bars - toilet;Grab bars - tub/shower          Prior Functioning/Environment              Frequency  Min 2X/week        Progress Toward Goals  OT Goals(current goals can now be found in the care plan section)  Progress towards OT goals: Progressing toward goals  Acute Rehab OT Goals Patient Stated Goal: none stated OT Goal Formulation: Patient unable to participate in goal setting Time For Goal Achievement: 09/02/24 Potential to Achieve Goals: Fair ADL Goals Pt Will Perform Grooming: with min assist;sitting Pt Will Perform Lower Body Dressing: with min assist;sit to/from stand Pt Will Transfer to Toilet: with mod assist;ambulating;bedside commode  Plan      Co-evaluation                 AM-PAC OT 6 Clicks Daily Activity     Outcome Measure   Help from another person eating meals?: A Little Help from another person taking care of personal grooming?: A Little Help from another person toileting, which includes using toliet, bedpan, or urinal?: A Lot Help from another person bathing (including washing, rinsing, drying)?: A Lot Help from another person to put on and taking off regular upper body clothing?: A Lot Help from another person to put on and taking off regular lower body clothing?: A Lot 6 Click Score: 14    End of Session Equipment Utilized During Treatment: Gait belt;Rolling walker (2 wheels);Oxygen  OT Visit Diagnosis: Unsteadiness on feet (R26.81);Muscle weakness  (generalized) (M62.81);History of falling (Z91.81)   Activity Tolerance Patient limited by fatigue   Patient Left in chair;with call bell/phone within reach;with chair alarm set   Nurse Communication Mobility status;Other (comment)        Time: 9088-9073 OT Time Calculation (min): 15 min  Charges: OT General Charges $OT Visit: 1 Visit OT Treatments $Self Care/Home Management : 8-22 mins  Rogers Clause, OT/L MSOT, 08/21/2024

## 2024-08-21 NOTE — Plan of Care (Signed)

## 2024-08-21 NOTE — TOC CM/SW Note (Incomplete)
 RNCM spoke with [employer]- they are not authorizing SNF .  RN notified daughter, Rayfield.  Plan now to return to [facility] with home PT/OT in place.   RNCM left message with Beth at [facility] to discuss case.

## 2024-08-21 NOTE — TOC Progression Note (Signed)
 Transition of Care [facility]) - Progression Note    Patient Details  Name: Kellie Harris MRN: [id number] Date of Birth: 02-Jul-1931  [facility] CM/SW Contact  Grayce JAYSON Perfect, RN Phone Number: 08/21/2024, 4:16 PM  Clinical Narrative:    RNCM met with son in patient's hospital room.  Denial letter for SNF given to son, family does plan to appeal.. Jori at [facility] notified of this.  TOC RN will continue to follow case.    Expected Discharge Plan: Assisted Living Barriers to Discharge: No Barriers Identified               Expected Discharge Plan and Services   Discharge Planning Services: CM Consult   Living arrangements for the past 2 months: (P) Assisted Living Facility                                       Social Drivers of Health (SDOH) Interventions SDOH Screenings   Food Insecurity: No Food Insecurity (08/18/2024)  Housing: Low Risk (08/18/2024)  Transportation Needs: No Transportation Needs (08/18/2024)  Utilities: Not At Risk (08/18/2024)  Financial Resource Strain: Low Risk  (07/17/2023)   Received from [facility]  Social Connections: Socially Isolated (08/19/2024)  Tobacco Use: Low Risk (08/17/2024)    Readmission Risk Interventions     No data to display

## 2024-08-22 DIAGNOSIS — Z7189 Other specified counseling: Secondary | ICD-10-CM | POA: Diagnosis not present

## 2024-08-22 DIAGNOSIS — J9611 Chronic respiratory failure with hypoxia: Secondary | ICD-10-CM

## 2024-08-22 DIAGNOSIS — Z515 Encounter for palliative care: Secondary | ICD-10-CM | POA: Diagnosis not present

## 2024-08-22 DIAGNOSIS — E43 Unspecified severe protein-calorie malnutrition: Secondary | ICD-10-CM

## 2024-08-22 DIAGNOSIS — R4182 Altered mental status, unspecified: Secondary | ICD-10-CM | POA: Diagnosis not present

## 2024-08-22 DIAGNOSIS — N3001 Acute cystitis with hematuria: Secondary | ICD-10-CM | POA: Diagnosis not present

## 2024-08-22 DIAGNOSIS — I5032 Chronic diastolic (congestive) heart failure: Secondary | ICD-10-CM

## 2024-08-22 LAB — CBC
HCT: 32.3 % — ABNORMAL LOW (ref 36.0–46.0)
Hemoglobin: 10.5 g/dL — ABNORMAL LOW (ref 12.0–15.0)
MCH: 29.3 pg (ref 26.0–34.0)
MCHC: 32.5 g/dL (ref 30.0–36.0)
MCV: 90.2 fL (ref 80.0–100.0)
Platelets: 205 K/uL (ref 150–400)
RBC: 3.58 MIL/uL — ABNORMAL LOW (ref 3.87–5.11)
RDW: 14.9 % (ref 11.5–15.5)
WBC: 10.3 K/uL (ref 4.0–10.5)
nRBC: 0 % (ref 0.0–0.2)

## 2024-08-22 LAB — BASIC METABOLIC PANEL WITH GFR
Anion gap: 9 (ref 5–15)
BUN: 36 mg/dL — ABNORMAL HIGH (ref 8–23)
CO2: 22 mmol/L (ref 22–32)
Calcium: 9.6 mg/dL (ref 8.9–10.3)
Chloride: 102 mmol/L (ref 98–111)
Creatinine, Ser: 1.05 mg/dL — ABNORMAL HIGH (ref 0.44–1.00)
GFR, Estimated: 50 mL/min — ABNORMAL LOW
Glucose, Bld: 109 mg/dL — ABNORMAL HIGH (ref 70–99)
Potassium: 4.3 mmol/L (ref 3.5–5.1)
Sodium: 133 mmol/L — ABNORMAL LOW (ref 135–145)

## 2024-08-22 LAB — MAGNESIUM: Magnesium: 2.1 mg/dL (ref 1.7–2.4)

## 2024-08-22 LAB — FERRITIN: Ferritin: 223 ng/mL (ref 11–307)

## 2024-08-22 LAB — FOLATE: Folate: 3.6 ng/mL — ABNORMAL LOW

## 2024-08-22 LAB — PRO BRAIN NATRIURETIC PEPTIDE: Pro Brain Natriuretic Peptide: 1541 pg/mL — ABNORMAL HIGH

## 2024-08-22 LAB — IRON AND TIBC
Iron: 19 ug/dL — ABNORMAL LOW (ref 28–170)
Saturation Ratios: 10 % — ABNORMAL LOW (ref 10.4–31.8)
TIBC: 193 ug/dL — ABNORMAL LOW (ref 250–450)
UIBC: 175 ug/dL

## 2024-08-22 LAB — VITAMIN B12: Vitamin B-12: 590 pg/mL (ref 180–914)

## 2024-08-22 MED ORDER — IRON SUCROSE 300 MG IVPB - SIMPLE MED
300.0000 mg | Freq: Once | Status: AC
  Administered 2024-08-22: 300 mg via INTRAVENOUS
  Filled 2024-08-22: qty 300

## 2024-08-22 MED ORDER — FOLIC ACID 1 MG PO TABS
1.0000 mg | ORAL_TABLET | Freq: Every day | ORAL | Status: DC
  Administered 2024-08-22: 1 mg via ORAL
  Filled 2024-08-22: qty 1

## 2024-08-22 MED ORDER — POLYETHYLENE GLYCOL 3350 17 G PO PACK
17.0000 g | PACK | Freq: Every day | ORAL | Status: DC
  Administered 2024-08-22: 17 g via ORAL
  Filled 2024-08-22: qty 1

## 2024-08-22 MED ORDER — FERROUS SULFATE 325 (65 FE) MG PO TABS: 325.0000 mg | ORAL_TABLET | Freq: Every day | ORAL | Status: DC

## 2024-08-22 MED ORDER — HYDRALAZINE HCL 50 MG PO TABS: 50.0000 mg | ORAL_TABLET | Freq: Three times a day (TID) | ORAL | Status: DC | PRN

## 2024-08-22 MED ORDER — SENNA 8.6 MG PO TABS
1.0000 | ORAL_TABLET | Freq: Every day | ORAL | Status: DC
  Administered 2024-08-22: 8.6 mg via ORAL
  Filled 2024-08-22: qty 1

## 2024-08-22 MED ORDER — LACTULOSE 10 GM/15ML PO SOLN
20.0000 g | Freq: Once | ORAL | Status: AC
  Administered 2024-08-22: 20 g via ORAL
  Filled 2024-08-22: qty 30

## 2024-08-22 MED ORDER — LAMOTRIGINE 25 MG PO TABS
125.0000 mg | ORAL_TABLET | Freq: Every day | ORAL | Status: DC
  Administered 2024-08-22 – 2024-08-23 (×2): 125 mg via ORAL
  Filled 2024-08-22 (×3): qty 1

## 2024-08-22 NOTE — Consult Note (Signed)
 "                                   Consultation Note Date: 08/22/2024   Patient Name: Kellie Harris  DOB: [birth year]  MRN: [id number]  Age / Sex: 89 y.o., female  PCP: Patient, No Pcp Per Referring Physician: Franchot Novel, MD  Reason for Consultation: Establishing goals of care   HPI/Brief Hospital Course: 89 y.o. female  with past medical history of HTN, HFpEF, dementia, HOH, recent history of C. Diff 06/2024 and recent diagnosis of chronic respiratory failure during last hospitalization requiring discharge on 2L [place] admitted from [facility] on 08/17/2024 with concerns for shortness of breath, hypoxia and 1 week confusion reported by facility staff.  In ED, found to have adequate SpO2 levels on 3L [place] UA positive for few bacteria and small leukocytes Labs stable CXR revealing opacity in left lung base--atelectasis versus infiltration with possible small left pleural effusion  Admitted and being treated for UTI--urine cultures resulted with klebsiella--started on IV antibiotics, acute metabolic encephalopathy, possible PNA--on IV antibiotics  Palliative medicine was consulted for assisting with goals of care conversations.  Subjective:  Chart reviewed: Labs:Labs reviewed since admission Vital signs:SpO2 stable Progress Notes:All primary team, therapy and TOC notes reviewed since admission Imaging:CXR from 1/5 and 1/9  Visited with Kellie Harris at her bedside. She is resting in bed but easily awakens to gentle touch. Kellie Harris is extremely HOH which complicates visit. Son at bedside brought in [place] erase board to assist with communication but Kellie Harris unable to comprehend. Son shares Kellie Harris is more confused than her cognitive baseline.  Introduced myself as a publishing rights manager as a member of the palliative care team. Explained palliative medicine is specialized medical care for people living with serious illness. It focuses on providing relief from the symptoms and stress of a serious illness.  The goal is to improve quality of life for both the patient and the family.   Son-Brad shares [name] has been as resident at [facility] assisted living facility since August. He speaks to her hospitalization in November and feels Ms. Roen has had and continues to have a progressive decline in her overall health.  Arvella shares he and his sister-[name] share HCPOA--[name] is unfortunately home with the flu and not able to be involved in visiting with her mom. Arvella has provided a copy of HCPOA--will upload to [employer]yn[employer] for viewing.  We discussed patient's current illness and what it means in the larger context of patient's on-going co-morbidities. Natural disease trajectory and expectations at EOL were discussed.   We discussed the overall philosophy of hospice being end of life care, focusing on comfort and dignity at end of life. No longer pursuing aggressive medical treatments/interventions. We also discussed transitioning to comfort care while inpatient.  We briefly discussed the role of hospice at home versus LTC facility versus IPU. Shared that at present time, Kellie Harris is not appropriate for IPU.  Arvella shares initial plan was for Kellie Harris to discharge to STR with a plan to transition to LTC at completion of rehab stay. Unfortunately, rehab was denied by insurance--Brad has started appeal process.  We discussed possibility of returning to [facility]F with hospice following pending facility being able to meet her current functional status needs. We also discussed transition to LTC with hospice following. Shared hospice services cannot be initiated at [facility] need to be referred  to outpatient palliative during rehab stay and transition to hospice services once transitioned to LTC.  Brad verbalizes understanding. Wishes to proceed with appeal while also inquiring about her return to [facility]F with hospice following. Engaged TOC to assist in communication with [facility]. Will hold on transitioning to  [facility] as plan may be for Kellie Harris to initially discharge to STR if appeal approved.  I discussed importance of continued conversations with family/support persons and all members of their medical team regarding overall plan of care and treatment options ensuring decisions are in alignment with patients goals of care.  All questions/concerns addressed. Emotional support provided to patient/family/support persons. PMT will continue to follow and support patient as needed.  Objective: Primary Diagnoses: Present on Admission:  Urinary tract infection  Essential hypertension   Physical Exam Constitutional:      General: She is not in acute distress.    Appearance: She is underweight. She is ill-appearing.     Comments: Frail  HENT:     Ears:     Comments: HOH Pulmonary:     Effort: Pulmonary effort is normal. No respiratory distress.  Skin:    General: Skin is warm and [place].     Findings: Bruising present.  Neurological:     Mental Status: She is easily aroused. She is disoriented.     Motor: Weakness present.  Psychiatric:        Mood and Affect: Mood normal.        Behavior: Behavior normal.     Vital Signs: BP (!) 158/90 (BP Location: Left Arm)   Pulse 75   Temp 98.2 F (36.8 C) (Oral)   Resp 16   Ht 5' 4 (1.626 m)   Wt 43.4 kg   SpO2 95%   BMI 16.42 kg/m  Pain Scale: 0-10   Pain Score: 0-No pain  IO: Intake/output summary:  Intake/Output Summary (Last 24 hours) at 08/22/2024 1458 Last data filed at 08/22/2024 0357 Gross per 24 hour  Intake 570.53 ml  Output --  Net 570.53 ml    LBM: Last BM Date : 08/22/24 Baseline Weight: Weight: 49.9 kg Most recent weight: Weight: 43.4 kg      Assessment and Plan  SUMMARY OF RECOMMENDATIONS   Continue current plan of care Interested in pursuing hospice on return to ALF versus LTC facility--possible d/c to STR if approved with transition to LTC and hospice at conclusion of rehab  Palliative Prophylaxis:   Bowel Regimen,  Delirium Protocol and Frequent Pain Assessment  Discussed With:Primary team, nursing staff and TOC   Thank you for this consult and allowing Palliative Medicine to participate in the care of Tiani S. Mccamish. Palliative medicine will continue to follow and assist as needed.   I personally spent a total of 75 minutes in the care of the patient today including preparing to see the patient, getting/reviewing separately obtained history, performing a medically appropriate exam/evaluation, counseling and educating, referring and communicating with other health care professionals, documenting clinical information in the EHR, and coordinating care.    Signed by: Waddell Lesches, DNP, AGNP-C Palliative Medicine    Please contact Palliative Medicine Team phone at [phone] for questions and concerns.  For individual provider: See Amion   "

## 2024-08-22 NOTE — Progress Notes (Signed)
 " Progress Note   Patient: Kellie Harris FMW:[id number] DOB: 04-11-1931 DOA: 08/17/2024     0 DOS: the patient was seen and examined on 08/22/2024   Brief hospital course: Per H&P HPI   ENAS WINCHEL is a 89 y.o. female with medical history significant for HTN, dementia, chronic HFpEF, history of C. difficile in November 2025, when she was hospitalized for acute dyspnea, attributed to CHF exacerbation, discharged on O2 at 2 L, being admitted from ALF with a UTI.  She was originally sent to the ED due to concerns for shortness of breath and hypoxia to 84% on her 3 L in the setting of 1 week history of increasing confusion.  O2 sats remained normal in the mid 90s with EMS so they attributed the initial low sats to the length of the O2 tubing at the facility. Upon arrival, remained with sats in the high 90s on 3 L with otherwise normal vitals. Labs showed UA with small leuks and a few bacteria.  CBC and BMP normal albeit with a slightly elevated anion gap of 16.EKG showed sinus at 76 and chest x-ray showed Opacity in the left lung base, likely representing atelectasis or infiltration with possible small left pleural effusion, similar to previous study  Assessment and Plan: * Urinary tract infection Ucx with klebsiella sensitive to cephalosporins Received Rocephin  for 3 days and will complete 2 days of Bactrim   Acute metabolic encephalopathy Dementia Unclear if back to baseline.  Difficult to assess given patient is very hard of hearing. Possibly related to urinary tract infection versus lower respiratory tract infection.  Possible component of hospital delirium as well.  Patient also with underlying neurocognitive impairment. --Delirium precautions --Continue antibiotics per above --Palliative was consulted and patient's family would like to pursue hospice at this time  Chronic respiratory failure with hypoxia (HCC) Initially hypoxic on home 3L of O2. Reportedly with cough as well. CXR with  infiltrate v atelectasis in LLL. Patient with O2 sats in the high 90s on 3L O2. Procal 0.18. RPP neg. repeat chest x-ray with increasing opacity at left lung base concerning for atelectasis versus pneumonia. -Continue antibiotics per above -Flutter valve -Wean oxygen as able.  Continue to monitor  Hypokalemia  Replace PRN   Normocytic anemia  Unclear etiology. No obvious bleeding.  Iron  panel notable for iron  deficiency, folate borderline, B12 within normal limits - Will start iron  supplementation and folate  AKI Possibly due to overdiuresis Improved with small amount of fluids Will continue to hold ARB for now.  - Avoid nephrotoxic agents - Encourage p.o. intake  Chronic heart failure with preserved ejection fraction (HFpEF) (HCC) Clinically euvolemic, BNP improved Hold ARB and lasix  given AKI  Continue coreg  -Check REDs as able  Seizure disorder (HCC) Continue lamotrigine   History of CVA (cerebrovascular accident) No acute issues Continue home meds  Essential hypertension Continue home meds  History of Clostridium difficile infection 06/2024 No acute issues suspected  L hip pain  S/p L hip replacement 2 years ago  S/p R knee replacement  Noted to have this since before Christmas time. She had an Xray at her facility which was negative for fracture per patient's daughter. Per therapy teams patient complained of L hip pain when working with patient.  X ray with no acute abnormality  Will continue with PT/OT  Pain control      Subjective: No acute complaints overnight.   Physical Exam: Vitals:   08/21/24 1755 08/21/24 1953 08/22/24 9582 08/22/24 9268  BP: (!) 151/83 (!) 151/92 (!) 165/86 (!) 158/90  Pulse: 79 81 81 75  Resp: 18 16 20 16   Temp: 98.8 F (37.1 C) 98.2 F (36.8 C) 98.2 F (36.8 C) 98.2 F (36.8 C)  TempSrc:  Oral Oral Oral  SpO2: 97% 97% 96% 95%  Weight:      Height:        Constitutional: In no distress.  Cardiovascular: Normal rate,  regular rhythm. No lower extremity edema  Pulmonary: Non labored breathing on room air, no wheezing or rales.   Abdominal: Soft. Non distended and non tender Musculoskeletal: Normal range of motion.     Neurological: Alert and oriented to person only. Skin: Skin is warm and R[place].    Data Reviewed:     Latest Ref Rng & Units 08/22/2024    5:49 AM 08/21/2024    5:56 AM 08/20/2024    7:46 AM  BMP  Glucose 70 - 99 mg/dL 890  93  98   BUN 8 - 23 mg/dL 36  39  29   Creatinine 0.44 - 1.00 mg/dL 8.94  8.66  8.77   Sodium 135 - 145 mmol/L 133  136  136   Potassium 3.5 - 5.1 mmol/L 4.3  4.0  3.9   Chloride 98 - 111 mmol/L 102  103  101   CO2 22 - 32 mmol/L 22  22  23    Calcium  8.9 - 10.3 mg/dL 9.6  9.4  9.2       Latest Ref Rng & Units 08/22/2024    5:49 AM 08/21/2024    5:56 AM 08/20/2024    7:46 AM  CBC  WBC 4.0 - 10.5 K/uL 10.3  9.6  9.7   Hemoglobin 12.0 - 15.0 g/dL 89.4  89.7  88.7   Hematocrit 36.0 - 46.0 % 32.3  31.9  35.3   Platelets 150 - 400 K/uL 205  191  204      Family Communication: Daughter 1/10  Disposition: Status is: Observation The patient remains OBS appropriate and will d/c before 2 midnights.  Planned Discharge Destination: Skilled nursing facility    Time spent: 35 minutes  Author: Alban Pepper, MD 08/22/2024 1:35 PM  For on call review [email or web address].  "

## 2024-08-22 NOTE — Consult Note (Signed)
 PHARMACY CONSULT NOTE - ELECTROLYTES  Pharmacy Consult for Electrolyte Monitoring and Replacement   Recent Labs: Height: 5' 4 (162.6 cm) Weight: 43.4 kg (95 lb 10.9 oz) IBW/kg (Calculated) : 54.7 Estimated Creatinine Clearance: 23.9 mL/min (A) (by C-G formula based on SCr of 1.05 mg/dL (H)). Potassium (mmol/L)  Date Value  08/22/2024 4.3  07/10/2014 3.8   Magnesium  (mg/dL)  Date Value  98/89/7973 2.1   Calcium  (mg/dL)  Date Value  98/89/7973 9.6   Calcium , Total (mg/dL)  Date Value  88/71/7984 10.1   Albumin (g/dL)  Date Value  88/95/7974 3.8  07/08/2014 4.2   Phosphorus (mg/dL)  Date Value  98/90/7973 3.2   Sodium (mmol/L)  Date Value  08/22/2024 133 (L)  07/10/2014 138    Assessment  Kellie Harris is a 89 y.o. female presenting with UTI. PMH significant for HTN, dementia, and HFpEF. Pharmacy has been consulted to monitor and replace electrolytes.  Diet: Heart health MIVF: N/A Pertinent medications:   Goal of Therapy: Electrolytes WNL  Plan:  No electrolyte replacement indicated at this time Check BMP with AM labs  Thank you for allowing pharmacy to be a part of this patient's care.  Suzann Allean LABOR, PharmD Clinical Pharmacist 08/22/2024 8:03 AM

## 2024-08-23 DIAGNOSIS — Z515 Encounter for palliative care: Secondary | ICD-10-CM | POA: Diagnosis not present

## 2024-08-23 DIAGNOSIS — J9611 Chronic respiratory failure with hypoxia: Secondary | ICD-10-CM | POA: Diagnosis not present

## 2024-08-23 DIAGNOSIS — N3001 Acute cystitis with hematuria: Secondary | ICD-10-CM | POA: Diagnosis not present

## 2024-08-23 DIAGNOSIS — I5032 Chronic diastolic (congestive) heart failure: Secondary | ICD-10-CM | POA: Diagnosis not present

## 2024-08-23 LAB — CBC WITH DIFFERENTIAL/PLATELET
Abs Immature Granulocytes: 0.09 K/uL — ABNORMAL HIGH (ref 0.00–0.07)
Basophils Absolute: 0.1 K/uL (ref 0.0–0.1)
Basophils Relative: 0 %
Eosinophils Absolute: 0.1 K/uL (ref 0.0–0.5)
Eosinophils Relative: 0 %
HCT: 35.9 % — ABNORMAL LOW (ref 36.0–46.0)
Hemoglobin: 11.4 g/dL — ABNORMAL LOW (ref 12.0–15.0)
Immature Granulocytes: 1 %
Lymphocytes Relative: 4 %
Lymphs Abs: 0.5 K/uL — ABNORMAL LOW (ref 0.7–4.0)
MCH: 29.1 pg (ref 26.0–34.0)
MCHC: 31.8 g/dL (ref 30.0–36.0)
MCV: 91.6 fL (ref 80.0–100.0)
Monocytes Absolute: 1.1 K/uL — ABNORMAL HIGH (ref 0.1–1.0)
Monocytes Relative: 8 %
Neutro Abs: 12.5 K/uL — ABNORMAL HIGH (ref 1.7–7.7)
Neutrophils Relative %: 87 %
Platelets: 249 K/uL (ref 150–400)
RBC: 3.92 MIL/uL (ref 3.87–5.11)
RDW: 14.9 % (ref 11.5–15.5)
WBC: 14.4 K/uL — ABNORMAL HIGH (ref 4.0–10.5)
nRBC: 0 % (ref 0.0–0.2)

## 2024-08-23 LAB — BASIC METABOLIC PANEL WITH GFR
Anion gap: 11 (ref 5–15)
BUN: 27 mg/dL — ABNORMAL HIGH (ref 8–23)
CO2: 22 mmol/L (ref 22–32)
Calcium: 9.5 mg/dL (ref 8.9–10.3)
Chloride: 102 mmol/L (ref 98–111)
Creatinine, Ser: 0.96 mg/dL (ref 0.44–1.00)
GFR, Estimated: 56 mL/min — ABNORMAL LOW
Glucose, Bld: 102 mg/dL — ABNORMAL HIGH (ref 70–99)
Potassium: 5 mmol/L (ref 3.5–5.1)
Sodium: 134 mmol/L — ABNORMAL LOW (ref 135–145)

## 2024-08-23 LAB — PROCALCITONIN: Procalcitonin: 0.25 ng/mL

## 2024-08-23 MED ORDER — ACETAMINOPHEN 650 MG RE SUPP: 650.0000 mg | Freq: Four times a day (QID) | RECTAL | Status: DC | PRN

## 2024-08-23 MED ORDER — GLYCOPYRROLATE 0.2 MG/ML IJ SOLN: 0.2000 mg | INTRAMUSCULAR | Status: DC | PRN

## 2024-08-23 MED ORDER — LORAZEPAM 1 MG PO TABS: 1.0000 mg | ORAL_TABLET | ORAL | Status: DC | PRN

## 2024-08-23 MED ORDER — LABETALOL HCL 5 MG/ML IV SOLN: 20.0000 mg | INTRAVENOUS | Status: DC | PRN

## 2024-08-23 MED ORDER — HALOPERIDOL LACTATE 2 MG/ML PO CONC: 0.5000 mg | ORAL | Status: DC | PRN

## 2024-08-23 MED ORDER — HYDRALAZINE HCL 20 MG/ML IJ SOLN
10.0000 mg | Freq: Four times a day (QID) | INTRAMUSCULAR | Status: DC | PRN
  Administered 2024-08-23: 10 mg via INTRAVENOUS
  Filled 2024-08-23: qty 1

## 2024-08-23 MED ORDER — POLYVINYL ALCOHOL 1.4 % OP SOLN: 1.0000 [drp] | Freq: Four times a day (QID) | OPHTHALMIC | Status: DC | PRN

## 2024-08-23 MED ORDER — SENNA 8.6 MG PO TABS: 1.0000 | ORAL_TABLET | Freq: Every day | ORAL | Status: DC | PRN

## 2024-08-23 MED ORDER — LORAZEPAM 2 MG/ML PO CONC: 1.0000 mg | ORAL | Status: DC | PRN

## 2024-08-23 MED ORDER — ONDANSETRON HCL 4 MG/2ML IJ SOLN: 4.0000 mg | Freq: Four times a day (QID) | INTRAMUSCULAR | Status: DC | PRN

## 2024-08-23 MED ORDER — POLYETHYLENE GLYCOL 3350 17 G PO PACK: 17.0000 g | PACK | Freq: Every day | ORAL | Status: DC | PRN

## 2024-08-23 MED ORDER — LORAZEPAM 2 MG/ML IJ SOLN: 1.0000 mg | INTRAMUSCULAR | Status: DC | PRN

## 2024-08-23 MED ORDER — MORPHINE SULFATE (CONCENTRATE) 10 MG /0.5 ML PO SOLN: 5.0000 mg | ORAL | Status: DC | PRN

## 2024-08-23 MED ORDER — HALOPERIDOL LACTATE 5 MG/ML IJ SOLN: 0.5000 mg | INTRAMUSCULAR | Status: DC | PRN

## 2024-08-23 MED ORDER — BIOTENE DRY MOUTH MT LIQD: 15.0000 mL | OROMUCOSAL | Status: DC | PRN

## 2024-08-23 MED ORDER — ACETAMINOPHEN 325 MG PO TABS: 650.0000 mg | ORAL_TABLET | Freq: Four times a day (QID) | ORAL | Status: DC | PRN

## 2024-08-23 MED ORDER — ONDANSETRON 4 MG PO TBDP: 4.0000 mg | ORAL_TABLET | Freq: Four times a day (QID) | ORAL | Status: DC | PRN

## 2024-08-23 MED ORDER — HALOPERIDOL 0.5 MG PO TABS: 0.5000 mg | ORAL_TABLET | ORAL | Status: DC | PRN

## 2024-08-23 MED ORDER — GLYCOPYRROLATE 1 MG PO TABS: 1.0000 mg | ORAL_TABLET | ORAL | Status: DC | PRN

## 2024-08-23 NOTE — Consult Note (Signed)
 PHARMACY CONSULT NOTE - ELECTROLYTES  Pharmacy Consult for Electrolyte Monitoring and Replacement   Recent Labs: Height: 5' 4 (162.6 cm) Weight: 43.4 kg (95 lb 10.9 oz) IBW/kg (Calculated) : 54.7 Estimated Creatinine Clearance: 26.2 mL/min (by C-G formula based on SCr of 0.96 mg/dL). Potassium (mmol/L)  Date Value  08/23/2024 5.0  07/10/2014 3.8   Magnesium  (mg/dL)  Date Value  98/89/7973 2.1   Calcium  (mg/dL)  Date Value  98/88/7973 9.5   Calcium , Total (mg/dL)  Date Value  88/71/7984 10.1   Albumin (g/dL)  Date Value  88/95/7974 3.8  07/08/2014 4.2   Phosphorus (mg/dL)  Date Value  98/90/7973 3.2   Sodium (mmol/L)  Date Value  08/23/2024 134 (L)  07/10/2014 138    Assessment  Kellie Harris is a 89 y.o. female presenting with UTI. PMH significant for HTN, dementia, and HFpEF. Pharmacy has been consulted to monitor and replace electrolytes.  Diet: dys 3 MIVF: N/A Pertinent medications: n/a  Goal of Therapy: Electrolytes WNL  Plan:  No electrolyte replacement indicated at this time Check BMP with AM labs  Thank you for allowing pharmacy to be a part of this patient's care.  Suzann Allean LABOR, PharmD Clinical Pharmacist 08/23/2024 9:33 AM

## 2024-08-23 NOTE — Progress Notes (Signed)
 [facility]) HOSPITAL LIAISON NOTE  Received request from Kenya Herndon, Case Manager (CM), for hospice services at facility after discharge. Family wanted patient to be assessed for hospice InPatient unit.  Went to patient's room.  No family present.  Ms. Lowman was lying in bed with her eyes closed.  She arouses to verbal stimuli and is alert.  She follows some commands but then drifts back to sleep.  She is taking some PO meds. I left my card on patient's bedside table and will call family to update.  3:30 Received phone call Brad Voisin, patient's son.  Patient's daughter, [name], is HCPOA- but she is home sick with the flu.  Informed Brad that patient is not appropriate for hospice IPU at this time.  He verbalized understanding and shares that the discharge plan is hopefully back to [place] with hospice.  They are coming to evaluate patient in next day or 2 to see if they can take her.  If [facility] is not able to take her- their choice is to seek LTC placement with hospice.  Education initiated related to hospice philosophy, services, and team approach to care.  Brad verbalized understanding.  Hospital liaison team will follow through final discharge disposition.  DME needs discussed.   Patient/family requests the following equipment for delivery:    If patient discharges to [facility]- will need hospital bed.            Brad Montijo or facility  is the contact to arrange time of equipment delivery.  Please send signed and completed DNR home with patient/family if applicable.   Please provide prescriptions at discharge as needed to ensure ongoing symptom management.   [facility] information and contact numbers given to [facility] Docter.  Above information shared with Kenya Herndon, CM and hospital medical care team.  Please call with any hospice related questions or concerns.  Thank you for the opportunity to participate in this patients care.  Saddie HILARIO Na, MA, BSN, RN, FNE Nurse  Liaison [phone]

## 2024-08-23 NOTE — Plan of Care (Signed)

## 2024-08-23 NOTE — Plan of Care (Signed)
" °  Problem: Clinical Measurements: Goal: Will remain free from infection Outcome: Progressing   Problem: Clinical Measurements: Goal: Respiratory complications will improve Outcome: Progressing   Problem: Clinical Measurements: Goal: Cardiovascular complication will be avoided Outcome: Progressing   Problem: Clinical Measurements: Goal: Ability to maintain clinical measurements within normal limits will improve Outcome: Progressing   Problem: Coping: Goal: Level of anxiety will decrease Outcome: Progressing   "

## 2024-08-23 NOTE — Progress Notes (Signed)
 "                                                                                                                                                                                                          Daily Progress Note   Patient Name: Kellie Harris       Date: 08/23/2024 DOB: [birth year]  Age: 89 y.o. MRN#: [id number] Attending Physician: Franchot Novel, MD Primary Care Physician: Patient, No Pcp Per Admit Date: 08/17/2024  Reason for Consultation/Follow-up: Establishing goals of care  HPI/Brief Hospital Review: 89 y.o. female  with past medical history of HTN, HFpEF, dementia, HOH, recent history of C. Diff 06/2024 and recent diagnosis of chronic respiratory failure during last hospitalization requiring discharge on 2L [place] admitted from [facility] on 08/17/2024 with concerns for shortness of breath, hypoxia and 1 week confusion reported by facility staff.   In ED, found to have adequate SpO2 levels on 3L [place] UA positive for few bacteria and small leukocytes Labs stable CXR revealing opacity in left lung base--atelectasis versus infiltration with possible small left pleural effusion   Admitted and being treated for UTI--urine cultures resulted with klebsiella--started on IV antibiotics, acute metabolic encephalopathy, possible PNA--on IV antibiotics   Palliative medicine was consulted for assisting with goals of care conversations.  Following up today for assisting in continued goals of care conversation and coordination of care.  Subjective: Chart reviewed: Progress Notes: Primary provider, nursing notes and TOC notes from previous day  Visited with Kellie Harris at her bedside. She is resting in bed comfortably, does not acknowledge my presence in room. Son at bedside during time of visit.  Shared with son Kellie Harris possibly able to accept Kellie Harris back with hospice following but will have someone from facility perform assessment on Monday. Son shares with conversations he has had with  insurance company it does not seem as though appeal for STR is going to be approved. Kellie Harris placed his sister-Kellie Harris on speaker phone.  Krsiti inquiring about hospice IPU--briefly spoke about eligibility criteria and do not believe Kellie Harris is eligible at this time--family would like hospice liaison to assess for eligibility. Shared with Kellie Harris update from Kellie Harris Quaker shares her concerns that prior to admission, Kellie Harris was almost exceeding the level of care that can be provided at ALF and is aware of a continued decline since admission. We discussed LTC options--out of pocket requirements--will have TOC reach out to Kellie Harris to further assist.  Discussed comfort care with son, Kellie Harris would no longer receive aggressive medical interventions such as  continuous vital signs, lab work, radiology testing, or medications not focused on comfort. All care would focus on how the patient is looking and feeling. This would include management of any symptoms that may cause discomfort, pain, shortness of breath, cough, nausea, agitation, anxiety, and/or secretions etc. Symptoms would be managed with medications and other non-pharmacological interventions such as spiritual support if requested, repositioning, music therapy, or therapeutic listening. Family verbalized understanding and appreciation. Kellie Harris verbalizes understanding and is in agreement with transitioning to full comfort measures.  Orders placed to reflect comfort, low symptom burden at this time. Utilize roxanol, lorazepam , haldol  as needed to maintain comfort.  Answered and addressed all questions and concerns. PMT to continue to follow for ongoing needs and support.   Objective:  Physical Exam Constitutional:      Comments: Sleeping  HENT:     Ears:     Comments: HOH Pulmonary:     Effort: Pulmonary effort is normal. No respiratory distress.  Skin:    General: Skin is warm and [place].     Findings: Bruising present.             Vital Signs:  BP (!) 169/76 (BP Location: Right Arm)   Pulse 69   Temp 98.2 F (36.8 C) (Oral)   Resp 18   Ht 5' 4 (1.626 m)   Wt 43.4 kg   SpO2 97%   BMI 16.42 kg/m  SpO2: SpO2: 97 % O2 Device: O2 Device: Room Air O2 Flow Rate: O2 Flow Rate (L/min): 0.5 L/min   Palliative Care Assessment & Plan   Assessment/Recommendation/Plan  DNR/DNI/Comfort Orders placed to reflect CMO  Care plan was discussed with primary team, nursing staff, TOC and hospice liaison.  Thank you for allowing the Palliative Medicine Team to assist in the care of this patient.  I personally spent a total of 50 minutes in the care of the patient today including preparing to see the patient, getting/reviewing separately obtained history, performing a medically appropriate exam/evaluation, counseling and educating, placing orders, referring and communicating with other health care professionals, documenting clinical information in the EHR, and coordinating care.   Waddell Lesches, DNP, AGNP-C Palliative Medicine   Please contact Palliative Medicine Team phone at [phone] for questions and concerns.   "

## 2024-08-23 NOTE — Progress Notes (Signed)
 " Progress Note   Patient: Kellie Harris FMW:[id number] DOB: 1931/08/04 DOA: 08/17/2024     0 DOS: the patient was seen and examined on 08/23/2024   Brief hospital course: Per H&P HPI   Kellie Harris is a 89 y.o. female with medical history significant for HTN, dementia, chronic HFpEF, history of C. difficile in November 2025, when she was hospitalized for acute dyspnea, attributed to CHF exacerbation, discharged on O2 at 2 L, being admitted from ALF with a UTI.  She was originally sent to the ED due to concerns for shortness of breath and hypoxia to 84% on her 3 L in the setting of 1 week history of increasing confusion.  O2 sats remained normal in the mid 90s with EMS so they attributed the initial low sats to the length of the O2 tubing at the facility. Upon arrival, remained with sats in the high 90s on 3 L with otherwise normal vitals. Labs showed UA with small leuks and a few bacteria.  CBC and BMP normal albeit with a slightly elevated anion gap of 16.EKG showed sinus at 76 and chest x-ray showed Opacity in the left lung base, likely representing atelectasis or infiltration with possible small left pleural effusion, similar to previous study  Assessment and Plan: * Urinary tract infection Ucx with klebsiella sensitive to cephalosporins Status post Rocephin  for 3 days and 2 days of Bactrim   Acute metabolic encephalopathy Delirium Dementia Unclear if back to baseline.  Difficult to assess given patient is very hard of hearing. Possibly related infection.  Possible component of hospital delirium as well.  Patient also with underlying neurocognitive impairment. --Delirium precautions --Continue antibiotics per above --Palliative was consulted and patient's family would like to pursue hospice at this time  Chronic respiratory failure with hypoxia (HCC) Initially hypoxic on home 3L of O2. Reportedly with cough as well. CXR with infiltrate v atelectasis in LLL. Patient with O2 sats in the high  90s on 3L O2. Procal 0.18. RPP neg. repeat chest x-ray with increasing opacity at left lung base concerning for atelectasis versus pneumonia. -Flutter valve -Wean oxygen as able.  Continue to monitor  Hypokalemia  Replace PRN   Normocytic anemia  Unclear etiology. No obvious bleeding.  Iron  panel notable for iron  deficiency, folate borderline, B12 within normal limits - s/p Fe supplementation. -Will hold on checking CBC given patient transition to comfort care  AKI resolved Possibly due to overdiuresis Normalized -Will stop checking BMP given transitioned to comfort care - Avoid nephrotoxic agents - Encourage p.o. intake  Chronic heart failure with preserved ejection fraction (HFpEF) (HCC) Clinically euvolemic, BNP improved Continue to hold lasix  and ARB  Continue coreg   Seizure disorder (HCC) Continue lamotrigine   History of CVA (cerebrovascular accident) No acute issues Continue home meds  Essential hypertension Continue home meds  History of Clostridium difficile infection 06/2024 No acute issues suspected  L hip pain  S/p L hip replacement 2 years ago  S/p R knee replacement  Noted to have this since before Christmas time. She had an Xray at her facility which was negative for fracture per patient's daughter. Per therapy teams patient complained of L hip pain when working with patient.  X ray with no acute abnormality  Will continue with PT/OT  Pain control      Subjective: Less participatory this AM.   Physical Exam: Vitals:   08/22/24 1735 08/22/24 2027 08/23/24 0600 08/23/24 0833  BP: (!) 159/81 (!) 167/69 (!) 174/69 (!) 169/76  Pulse:  81 77 69  Resp:  20 16 18   Temp:  98 F (36.7 C) (!) 97.4 F (36.3 C) 98.2 F (36.8 C)  TempSrc:  Oral Axillary Oral  SpO2:  94% 97% 97%  Weight:      Height:        Physical Exam  Constitutional: In no distress.  Cardiovascular: Normal rate, regular rhythm. No lower extremity edema  Pulmonary: Non labored  breathing on room air, no wheezing or rales.   Abdominal: Soft. Non distended and non tender Musculoskeletal: Normal range of motion.     Neurological: Alert not oriented  Skin: Skin is warm and [place].    Data Reviewed:     Latest Ref Rng & Units 08/23/2024    7:52 AM 08/22/2024    5:49 AM 08/21/2024    5:56 AM  BMP  Glucose 70 - 99 mg/dL 897  890  93   BUN 8 - 23 mg/dL 27  36  39   Creatinine 0.44 - 1.00 mg/dL 9.03  8.94  8.66   Sodium 135 - 145 mmol/L 134  133  136   Potassium 3.5 - 5.1 mmol/L 5.0  4.3  4.0   Chloride 98 - 111 mmol/L 102  102  103   CO2 22 - 32 mmol/L 22  22  22    Calcium  8.9 - 10.3 mg/dL 9.5  9.6  9.4       Latest Ref Rng & Units 08/23/2024    7:52 AM 08/22/2024    5:49 AM 08/21/2024    5:56 AM  CBC  WBC 4.0 - 10.5 K/uL 14.4  10.3  9.6   Hemoglobin 12.0 - 15.0 g/dL 88.5  89.4  89.7   Hematocrit 36.0 - 46.0 % 35.9  32.3  31.9   Platelets 150 - 400 K/uL 249  205  191      Family Communication: Daughter 1/10  Disposition: Status is: Observation The patient remains OBS appropriate and will d/c before 2 midnights.  Planned Discharge Destination: Skilled nursing facility transitioned to comfort care awaiting placement at LTC    Time spent: 35 minutes  Author: Alban Pepper, MD 08/23/2024 2:58 PM  For on call review [email or web address].  "

## 2024-08-23 NOTE — TOC Progression Note (Addendum)
 Transition of Care [facility]) - Progression Note    Patient Details  Name: Kellie Harris MRN: [id number] Date of Birth: 11/01/1930  Transition of Care [facility]) CM/SW Contact  Dasean Brow L Ricardo Kayes, [place] Phone Number: 08/23/2024, 8:00 AM  Clinical Narrative:     Notification received from [facility]. CSW communicated with Justyce. Justyce advised patient can [facility] with [facility]). Justyce will come to [facility] on Monday to assess whether they can meet her needs. Attending notified of discussion.   12:40pm: CSW attempted to contact [name], daughter, to discuss LTC options and choice for Hospice. A HIPAA compliant voicemail was left requesting a return call.   Resources will be added to the AVS.    1:02pm: Return call received from [name]. Options were discussed. [facility] coming tomorrow to evaluate. [place] concerned as [facility] is not a medical facility. She said she would have to pay for additional support. [name] is open to accepting bed offer at [facility] and private paying. She was advised that she would have to contact them to discuss if bed can be transitioned into LTC placement. CSW informed her of [facility] which would do the leg work in locating a LTC bed. She was open to this. Referral made.   CSW and [place] discussed patient discharging to family home. [name] advised that she has thought about this and also thought about private paying for care.    3:59pm: Armed Forces Technical Officer received from Abigail, [employer]. She spoke with [name] and placement may be secured as early as tomorrow. There is a facility in [place] and [name] will visit [facility] tomorrow.      Expected Discharge Plan: Assisted Living Barriers to Discharge: No Barriers Identified               Expected Discharge Plan and Services   Discharge Planning Services: CM Consult :  Living arrangements for the past 2 months: (P) Assisted Living Facility                                        Social Drivers of Health (SDOH) Interventions SDOH Screenings   Food Insecurity: No Food Insecurity (08/18/2024)  Housing: Low Risk (08/18/2024)  Transportation Needs: No Transportation Needs (08/18/2024)  Utilities: Not At Risk (08/18/2024)  Financial Resource Strain: Low Risk  (07/17/2023)   Received from [facility]  Social Connections: Socially Isolated (08/19/2024)  Tobacco Use: Low Risk (08/17/2024)    Readmission Risk Interventions     No data to display

## 2024-08-24 DIAGNOSIS — Z515 Encounter for palliative care: Secondary | ICD-10-CM | POA: Diagnosis not present

## 2024-08-24 DIAGNOSIS — R0902 Hypoxemia: Secondary | ICD-10-CM

## 2024-08-24 DIAGNOSIS — N3001 Acute cystitis with hematuria: Secondary | ICD-10-CM | POA: Diagnosis not present

## 2024-08-24 DIAGNOSIS — G9341 Metabolic encephalopathy: Secondary | ICD-10-CM

## 2024-08-24 DIAGNOSIS — B961 Klebsiella pneumoniae [K. pneumoniae] as the cause of diseases classified elsewhere: Secondary | ICD-10-CM

## 2024-08-24 NOTE — TOC Progression Note (Signed)
 Transition of Care [facility]) - Progression Note    Patient Details  Name: Kellie Harris MRN: [id number] Date of Birth: 03-11-1931  Transition [facility] Care [facility]) CM/SW Contact  Grayce JAYSON Perfect, RN Phone Number: 08/24/2024, 12:28 PM  Clinical Narrative:   Hospice will order and deliver hospital bed and over the bed table and will let RNCM know when delivered so we can then arrange transport back to [facility] via [facility].  Team updated on this note    Expected Discharge Plan: Assisted Living Barriers to Discharge: No Barriers Identified               Expected Discharge Plan and Services   Discharge Planning Services: CM Consult   Living arrangements for the past 2 months: (P) Assisted Living Facility                                       Social Drivers of Health (SDOH) Interventions SDOH Screenings   Food Insecurity: No Food Insecurity (08/18/2024)  Housing: Low Risk (08/18/2024)  Transportation Needs: No Transportation Needs (08/18/2024)  Utilities: Not At Risk (08/18/2024)  Financial Resource Strain: Low Risk  (07/17/2023)   Received from [facility]  Social Connections: Socially Isolated (08/19/2024)  Tobacco Use: Low Risk (08/17/2024)    Readmission Risk Interventions     No data to display

## 2024-08-24 NOTE — Progress Notes (Signed)
 "                                                                                                                                                                                               Palliative Care Progress Note, Assessment & Plan   Patient Name: Kellie Harris       Date: 08/24/2024 DOB: 1931-08-07  Age: 89 y.o. MRN#: [id number] Attending Physician: Franchot Novel, MD Primary Care Physician: Patient, No Pcp Per Admit Date: 08/17/2024  Subjective: Unable to assess  HPI: Per previous HPI: 89 y.o. female  with past medical history of HTN, HFpEF, dementia, HOH, recent history of C. Diff 06/2024 and recent diagnosis of chronic respiratory failure during last hospitalization requiring discharge on 2L [place] admitted from [facility] on 08/17/2024 with concerns for shortness of breath, hypoxia and 1 week confusion reported by facility staff.   In ED, found to have adequate SpO2 levels on 3L [place] UA positive for few bacteria and small leukocytes Labs stable CXR revealing opacity in left lung base--atelectasis versus infiltration with possible small left pleural effusion   Admitted and being treated for UTI--urine cultures resulted with klebsiella--started on IV antibiotics, acute metabolic encephalopathy, possible PNA--on IV antibiotics   Palliative medicine was consulted for assisting with goals of care conversations.   Following up today for assisting in continued goals of care conversation and coordination of care.  Summary of counseling/coordination of care Chart reviewed:   Labs: No new labs since 08/23/2024    Latest Ref Rng & Units 08/23/2024    7:52 AM 08/22/2024    5:49 AM 08/21/2024    5:56 AM  CBC  WBC 4.0 - 10.5 K/uL 14.4  10.3  9.6   Hemoglobin 12.0 - 15.0 g/dL 88.5  89.4  89.7   Hematocrit 36.0 - 46.0 % 35.9  32.3  31.9   Platelets 150 - 400 K/uL 249  205  191       Latest Ref Rng & Units 08/23/2024    7:52 AM 08/22/2024    5:49 AM 08/21/2024    5:56 AM  CMP  Glucose 70 -  99 mg/dL 897  890  93   BUN 8 - 23 mg/dL 27  36  39   Creatinine 0.44 - 1.00 mg/dL 9.03  8.94  8.66   Sodium 135 - 145 mmol/L 134  133  136   Potassium 3.5 - 5.1 mmol/L 5.0  4.3  4.0   Chloride 98 - 111 mmol/L 102  102  103   CO2 22 - 32 mmol/L 22  22  22    Calcium   8.9 - 10.3 mg/dL 9.5  9.6  9.4      Recent vitals: Blood pressure (!) 165/83, pulse 76, temperature (!) 97.4 F (36.3 C), resp. rate 18, height 5' 4 (1.626 m), weight 43.4 kg, SpO2 96%.   Progress notes: Reviewed progress notes from TRH, PMT, TOC and nursing staff.  Imaging: No new imaging  MAR: No changes to [facility]  ACP documents: No documents to review  After reviewing the patient's chart and assessing the patient at bedside, I spoke with patient in regards to symptom management and goals of care.   Ill-appearing, elderly female leaping in bed.  She awakens to verbal stimuli and acknowledges my presence but is unable to answer questions.  She is calm peers comfortable.  Respirations are even and unlabored.  She is in no distress. Family or staff at the bedside during visit.  Patient is comfort measures only.  She is not symptomatic with pain, shortness of breath, anxiety or agitation.  Per TOC note, patient has been accepted back to [facility] and will discharge today with hospice following.  Physical Exam Vitals reviewed.  Constitutional:      General: She is not in acute distress.    Appearance: She is ill-appearing.  HENT:     Head: Normocephalic and atraumatic.     Mouth/Throat:     Mouth: Mucous membranes are [place].  Pulmonary:     Effort: Pulmonary effort is normal. No respiratory distress.  Musculoskeletal:     Right lower leg: No edema.     Left lower leg: No edema.  Skin:    General: Skin is warm and [place].  Neurological:     Mental Status: She is alert.     Motor: Weakness present.            Recommendations/Plan: Comfort measures only Per TOC note, patient to transfer back to [facility] today  with outpatient hospice following.  I personally spent a total of 50 minutes in the care of the patient today including preparing to see the patient, getting/reviewing separately obtained history, performing a medically appropriate exam/evaluation, and documenting clinical information in the EHR.    Devere Sacks, AMANDA [facility] Palliative Medicine Team  08/24/2024 9:49 AM  Office [phone]  Pager [phone]     "

## 2024-08-24 NOTE — Progress Notes (Signed)
 [facility]) HOSPITAL LIAISON NOTE  Follow up on patient status.  Received notification that patient has been accepted back to [facility] and plans to discharge today.  Liaison confirmed delivery of hospital bed by [facility] and confirmed plans with daughter, [name].   Please send signed and completed DNR home with patient/family if applicable.    Please provide prescriptions at discharge as needed to ensure ongoing symptom management.    [facility] information and contact numbers given to [employer] and [name] Ure.   Above information shared with CM and hospital medical care team.   Please call with any hospice related questions or concerns.   Thank you for the opportunity to participate in this patients care.   Inocente Jacobs, BSN RN Nurse Liaison [phone]

## 2024-08-24 NOTE — Progress Notes (Signed)
 Hospital Bed  The patient requires a hospital bed in the home due to significant limitations with mobility and the need for frequent position changes that can not be safely or effectively achieved with a standard bed. Without this equipment the patient is at increased risk for complications related to immobility and impaired breathing.

## 2024-08-24 NOTE — Progress Notes (Signed)
 Nutrition Brief Note  Chart reviewed. Pt now transitioning to comfort care. Plan to discharge to ALF with hospice services.  No further nutrition interventions planned at this time.  Please re-consult as needed.   Margery ORN, RD, LDN, CDCES Registered Dietitian III Certified Diabetes Care and Education Specialist If unable to reach this RD, please use RD Inpatient group chat on secure chat between hours of 8am-4 pm daily

## 2024-08-24 NOTE — Plan of Care (Signed)
  Problem: Safety: Goal: Ability to remain free from injury will improve Outcome: Progressing   Problem: Pain Managment: Goal: General experience of comfort will improve and/or be controlled Outcome: Progressing   Problem: Coping: Goal: Level of anxiety will decrease Outcome: Progressing

## 2024-08-24 NOTE — TOC Transition Note (Signed)
 Transition of Care [facility]) - Discharge Note   Patient Details  Name: Kellie Harris MRN: [id number] Date of Birth: [birth year]  [facility]) CM/SW Contact:  Dalia GORMAN Fuse, RN Phone Number: 08/24/2024, 4:29 PM   Clinical Narrative:     Patient is medically clear to dc to [facility] with Hospice. DME bed and overbed table delivered. Patients family is aware. [facility] to transport. Patient is 8th on the list.  No other TOC needs.  Nurse to call report to [phone] RM 12   Final next level of care: Assisted Living Barriers to Discharge: Barriers Resolved   Patient Goals and CMS Choice            Discharge Placement              Patient chooses bed at:  [facility]) Patient to be transferred to facility by: [facility]   Patient and family notified of of transfer: 08/24/24  Discharge Plan and Services Additional resources added to the After Visit Summary for     Discharge Planning Services: CM Consult                                 Social Drivers of Health (SDOH) Interventions SDOH Screenings   Food Insecurity: No Food Insecurity (08/18/2024)  Housing: Low Risk (08/18/2024)  Transportation Needs: No Transportation Needs (08/18/2024)  Utilities: Not At Risk (08/18/2024)  Financial Resource Strain: Low Risk  (07/17/2023)   Received from [facility]  Social Connections: Socially Isolated (08/19/2024)  Tobacco Use: Low Risk (08/17/2024)     Readmission Risk Interventions     No data to display

## 2024-08-24 NOTE — TOC Progression Note (Signed)
 Transition of Care [facility]) - Progression Note    Patient Details  Name: Kellie Harris MRN: [id number] Date of Birth: Apr 18, 1931  Transition of Care [facility]) CM/SW Contact  Dalia GORMAN Fuse, RN Phone Number: 08/24/2024, 10:29 AM  Clinical Narrative:     TOC received a call from Joen Bright with [facility]. They completed the evaluation this morning and can admit the patient back as long as she is being followed by Hospice. They would like to have a hospital bed and over bed table delivered. TOC made MD and Hospice Coordinator aware. The plan is for the patient to transport to [facility] via EMS.  Expected Discharge Plan: Assisted Living Barriers to Discharge: No Barriers Identified               Expected Discharge Plan and Services   Discharge Planning Services: CM Consult   Living arrangements for the past 2 months: (P) Assisted Living Facility                                       Social Drivers of Health (SDOH) Interventions SDOH Screenings   Food Insecurity: No Food Insecurity (08/18/2024)  Housing: Low Risk (08/18/2024)  Transportation Needs: No Transportation Needs (08/18/2024)  Utilities: Not At Risk (08/18/2024)  Financial Resource Strain: Low Risk  (07/17/2023)   Received from [facility]  Social Connections: Socially Isolated (08/19/2024)  Tobacco Use: Low Risk (08/17/2024)    Readmission Risk Interventions     No data to display

## 2024-08-24 NOTE — Discharge Summary (Signed)
 " Physician Discharge Summary   Patient: Kellie Harris MRN: [id number] DOB: 09-30-1930  Admit date:     08/17/2024  Discharge date: 08/24/2024  Discharge Physician: Alban Pepper   PCP: Patient, No Pcp Per   Recommendations at discharge:    None.   Discharge Diagnoses: Principal Problem:   Urinary tract infection Active Problems:   Acute metabolic encephalopathy   Chronic respiratory failure with hypoxia (HCC)   History of Clostridium difficile infection 06/2024   Chronic heart failure with preserved ejection fraction (HFpEF) (HCC)   Essential hypertension   History of CVA (cerebrovascular accident)   Seizure disorder (HCC)   Protein-calorie malnutrition, severe  Resolved Problems:   * No resolved hospital problems. [facility] Course: Per H&P HPI    Kellie Harris is a 89 y.o. female with medical history significant for HTN, dementia, chronic HFpEF, history of C. difficile in November 2025, when she was hospitalized for acute dyspnea, attributed to CHF exacerbation, discharged on O2 at 2 L, being admitted from ALF with a UTI.  She was originally sent to the ED due to concerns for shortness of breath and hypoxia to 84% on her 3 L in the setting of 1 week history of increasing confusion.  O2 sats remained normal in the mid 90s with EMS so they attributed the initial low sats to the length of the O2 tubing at the facility. Upon arrival, remained with sats in the high 90s on 3 L with otherwise normal vitals. Labs showed UA with small leuks and a few bacteria.  CBC and BMP normal albeit with a slightly elevated anion gap of 16.EKG showed sinus at 76 and chest x-ray showed Opacity in the left lung base, likely representing atelectasis or infiltration with possible small left pleural effusion, similar to previous study  Patient was transition to comfort care.  Assessment and Plan: * Urinary tract infection Ucx with klebsiella sensitive to cephalosporins Status post course of antibiotics  with  Rocephin  for 3 days and 2 days of Bactrim    Acute metabolic encephalopathy Delirium Dementia Patient with underlying neurocognitive impairment and likely delirious.  No further workup was pursued as she was transition to comfort care.     Chronic respiratory failure with hypoxia (HCC) At time of discharge patient was breathing comfortably on room air.   Hypokalemia  Replace PRN    Normocytic anemia  Patient received a dose of IV iron .  Labs were discontinued as she was transition to comfort care   AKI resolved This resolved and for the labs were stopped due to patient being transition to comfort care.   Chronic heart failure with preserved ejection fraction (HFpEF) (HCC) Her home heart failure medications were held on discharge she was transition to comfort care.   Seizure disorder (HCC) Lamotrigine  was continued on discharge   History of CVA (cerebrovascular accident) No acute issues Continue home meds   Essential hypertension Continue home meds   History of Clostridium difficile infection 06/2024 No acute issues suspected   L hip pain  S/p L hip replacement 2 years ago  S/p R knee replacement  Noted to have this since before Christmas time. She had an Xray at her facility which was negative for fracture per patient's daughter.  X-ray here without any acute abnormality..  Continue pain control.        Consultants: Palliative Procedures performed: None  Disposition: ALF with hospice  Diet recommendation:  Dys 1 diet  DISCHARGE MEDICATION: Allergies as of 08/24/2024  No Known Allergies      Medication List     STOP taking these medications    aspirin  81 MG chewable tablet   atorvastatin  20 MG tablet Commonly known as: LIPITOR   carteolol  1 % ophthalmic solution Commonly known as: OCUPRESS    carvedilol  6.25 MG tablet Commonly known as: COREG    colestipol 1 g tablet Commonly known as: COLESTID   furosemide  20 MG tablet Commonly known as:  Lasix    latanoprost  0.005 % ophthalmic solution Commonly known as: XALATAN    losartan  100 MG tablet Commonly known as: COZAAR    pantoprazole  20 MG tablet Commonly known as: Protonix    potassium chloride  10 MEQ tablet Commonly known as: KLOR-CON  M   Vitamin D  50 MCG (2000 UT) tablet       TAKE these medications    acetaminophen  500 MG tablet Commonly known as: TYLENOL  Take 1,000 mg by mouth in the morning and at bedtime. 0900/2100   lamoTRIgine  100 MG tablet Commonly known as: LAMICTAL  Take 100 mg by mouth at bedtime.   lamoTRIgine  100 MG tablet Commonly known as: LAMICTAL  Take 100 mg by mouth in the morning.   lamoTRIgine  25 MG tablet Commonly known as: LAMICTAL  Take 25 mg by mouth at bedtime.   loperamide  2 MG tablet Commonly known as: IMODIUM  A-D Take 1 tablet by mouth every 6 (six) hours as needed for diarrhea or loose stools.               Durable Medical Equipment  (From admission, onward)           Start     Ordered   08/24/24 1157  For home use only DME Overbed table  Once        08/24/24 1156   08/24/24 1155  For home use only DME Hospital bed  Once       Question Answer Comment  Length of Need 12 Months   Patient has (list medical condition): severe dementia   Bed type Semi-electric      08/24/24 1154            Contact information for follow-up providers     [facility] Emergency Department at [facility] .   Specialty: Emergency Medicine Why: As needed, If symptoms worsen Contact information: [address] [place]  [address] [phone]             Contact information for after-discharge care     Destination     [facility]   Service: Skilled Nursing Contact information: [address] [place]  [address] [phone]                    Discharge Exam: Kellie Harris   08/17/24 2120 08/18/24 0503  Weight: 49.9 kg 43.4 kg   Physical  Exam  Constitutional: In no distress.  Chronically ill-appearing Cardiovascular: Normal rate, regular rhythm. No lower extremity edema  Pulmonary: Non labored breathing on room air, no wheezing or rales.   Abd: Soft and NT, ND Skin: Skin is warm and Lemke.    Condition at discharge: stable  The results of significant diagnostics from this hospitalization (including imaging, microbiology, ancillary and laboratory) are listed below for reference.   Imaging Studies: DG Chest Port 1 View Result Date: 08/21/2024 CLINICAL DATA:  Oxygen desaturation. EXAM: PORTABLE CHEST 1 VIEW COMPARISON:  08/17/2024 FINDINGS: The heart is prominent in size but stable. Aortic atherosclerosis and tortuosity. Increasing patchy opacity at the left  lung base. Possible trace left pleural effusion. No pulmonary edema. No pneumothorax. Chronic shoulder arthropathy. IMPRESSION: Increasing patchy opacity at the left lung base, atelectasis versus pneumonia. Possible trace left pleural effusion. Electronically Signed   By: Andrea Gasman M.D.   On: 08/21/2024 15:44   DG HIP UNILAT WITH PELVIS 2-3 VIEWS LEFT Result Date: 08/19/2024 CLINICAL DATA:  Left hip pain EXAM: DG HIP (WITH OR WITHOUT PELVIS) 2-3V LEFT COMPARISON:  September 08, 2021 FINDINGS: Status post left total hip arthroplasty. No acute fracture or dislocation is noted. IMPRESSION: No acute abnormality seen. Electronically Signed   By: Lynwood Landy Raddle M.D.   On: 08/19/2024 16:50   DG Chest Portable 1 View Result Date: 08/17/2024 EXAM: 1 VIEW(S) XRAY OF THE CHEST 08/17/2024 11:05:00 PM COMPARISON: 06/17/2024 CLINICAL HISTORY: hypoxia, eval infiltrate, pulm congestion FINDINGS: LUNGS AND PLEURA: Inspiration. Opacity in the left lung base likely representing atelectasis or infiltration with possible small left pleural effusion. Similar appearance to previous study. Probable emphysematous changes in the lungs. No pneumothorax. HEART AND MEDIASTINUM: Heart size and pulmonary  vascularity are normal. Mediastinal contours appear intact. Tortuous and calcified aorta. BONES AND SOFT TISSUES: Degenerative changes in the spine and shoulders. S-shaped thoracolumbar scoliosis. No acute osseous abnormality. IMPRESSION: 1. Opacity in the left lung base, likely representing atelectasis or infiltration with possible small left pleural effusion, similar to previous study. 2. Probable emphysematous changes in the lungs. Electronically signed by: Elsie Gravely MD 08/17/2024 11:10 PM EST RP Workstation: [id number]   CT Cervical Spine Wo Contrast Result Date: 07/30/2024 EXAM: CT CERVICAL SPINE WITHOUT CONTRAST 07/30/2024 03:13:29 PM TECHNIQUE: CT of the cervical spine was performed without the administration of intravenous contrast. Multiplanar reformatted images are provided for review. Automated exposure control, iterative reconstruction, and/or weight based adjustment of the mA/kV was utilized to reduce the radiation dose to as low as reasonably achievable. COMPARISON: None available. CLINICAL HISTORY: Neck trauma (Age >= 65y) FINDINGS: BONES AND ALIGNMENT: Diffusely decreased bone density. Reversal of the normal cervical lordosis is centered at the C3 level, likely due to positioning and degenerative changes. No acute fracture or traumatic malalignment. DEGENERATIVE CHANGES: Multilevel moderate degenerative changes of the spine. Multilevel intervertebral disc space narrowing. SOFT TISSUES: Atherosclerotic plaque of the carotid arteries within the neck. No prevertebral soft tissue swelling. IMPRESSION: 1. No acute cervical spine fracture or traumatic malalignment. Electronically signed by: Morgane Naveau MD 07/30/2024 03:42 PM EST RP Workstation: [id number]   CT Head Wo Contrast Result Date: 07/30/2024 EXAM: CT HEAD WITHOUT CONTRAST 07/30/2024 03:13:29 PM TECHNIQUE: CT of the head was performed without the administration of intravenous contrast. Automated exposure control, iterative  reconstruction, and/or weight based adjustment of the mA/kV was utilized to reduce the radiation dose to as low as reasonably achievable. COMPARISON: 02/18/2024 CLINICAL HISTORY: Head trauma, moderate-severe FINDINGS: BRAIN AND VENTRICLES: No acute hemorrhage. No evidence of acute infarct. No hydrocephalus. No extra-axial collection. No mass effect or midline shift. Cerebral ventricle sizes are concordant with the degree of cerebral volume loss. Patchy and confluent areas of decreased attenuation are noted throughout the deep and periventricular white matter of the cerebral hemispheres bilaterally suggestive of chronic microvascular ischemic changes. Chronic right thalamic infarct. Calcified atherosclerotic plaque in cavernous/supraclinoid ICA and intradural vertebral arteries. ORBITS: No acute abnormality. Bilateral lens replacement noted. SINUSES: No acute abnormality. SOFT TISSUES AND SKULL: 5 mm right parieto-occipital scalp hematoma. No skull fracture. IMPRESSION: 1. No acute intracranial abnormality related to head trauma. Electronically signed by: Morgane Naveau MD 07/30/2024 03:40  PM EST RP Workstation: [id number]    Microbiology: Results for orders placed or performed during the hospital encounter of 08/17/24  Urine Culture     Status: Abnormal   Collection Time: 08/18/24  1:18 AM   Specimen: Urine, Clean Catch  Result Value Ref Range Status   Specimen Description   Final    URINE, CLEAN CATCH Performed at [facility], [address], [place], [place] [address]    Special Requests   Final    NONE Performed at [facility], [address] [place], [place] [address]    Culture >=100,000 COLONIES/mL KLEBSIELLA AEROGENES (A)  Final   Report Status 08/20/2024 FINAL  Final   Organism ID, Bacteria KLEBSIELLA AEROGENES (A)  Final      Susceptibility   Klebsiella aerogenes - MIC*    CEFEPIME <=0.12 SENSITIVE Sensitive     ERTAPENEM <=0.12 SENSITIVE Sensitive      CEFTRIAXONE  1 SENSITIVE Sensitive     CIPROFLOXACIN <=0.06 SENSITIVE Sensitive     GENTAMICIN <=1 SENSITIVE Sensitive     NITROFURANTOIN 64 INTERMEDIATE Intermediate     TRIMETH /SULFA  <=20 SENSITIVE Sensitive     PIP/TAZO Value in next row Sensitive      8 SENSITIVEThis is a modified FDA-approved test that has been validated and its performance characteristics determined by the reporting laboratory.  This laboratory is certified under the Clinical Laboratory Improvement Amendments CLIA as qualified to perform high complexity clinical laboratory testing.    MEROPENEM Value in next row Sensitive      8 SENSITIVEThis is a modified FDA-approved test that has been validated and its performance characteristics determined by the reporting laboratory.  This laboratory is certified under the Clinical Laboratory Improvement Amendments CLIA as qualified to perform high complexity clinical laboratory testing.    * >=100,000 COLONIES/mL KLEBSIELLA AEROGENES  Resp panel by RT-PCR (RSV, Flu A&B, Covid) Anterior Nasal Swab     Status: None   Collection Time: 08/18/24  5:45 AM   Specimen: Anterior Nasal Swab  Result Value Ref Range Status   SARS Coronavirus 2 by RT PCR NEGATIVE NEGATIVE Final    Comment: (NOTE) SARS-CoV-2 target nucleic acids are NOT DETECTED.  The SARS-CoV-2 RNA is generally detectable in upper respiratory specimens during the acute phase of infection. The lowest concentration of SARS-CoV-2 viral copies this assay can detect is 138 copies/mL. A negative result does not preclude SARS-Cov-2 infection and should not be used as the sole basis for treatment or other patient management decisions. A negative result may occur with  improper specimen collection/handling, submission of specimen other than nasopharyngeal swab, presence of viral mutation(s) within the areas targeted by this assay, and inadequate number of viral copies(<138 copies/mL). A negative result must be combined with clinical  observations, patient history, and epidemiological information. The expected result is Negative.  Fact Sheet for Patients:  bloggercourse.com  Fact Sheet for Healthcare Providers:  seriousbroker.it  This test is no t yet approved or cleared by the [place]  FDA and  has been authorized for detection and/or diagnosis of SARS-CoV-2 by FDA under an Emergency Use Authorization (EUA). This EUA will remain  in effect (meaning this test can be used) for the duration of the COVID-19 declaration under Section 564(b)(1) of the Act, 21 U.S.C.section 360bbb-3(b)(1), unless the authorization is terminated  or revoked sooner.       Influenza A by PCR NEGATIVE NEGATIVE Final   Influenza B by PCR NEGATIVE NEGATIVE Final    Comment: (NOTE)  The Xpert Xpress SARS-CoV-2/FLU/RSV plus assay is intended as an aid in the diagnosis of influenza from Nasopharyngeal swab specimens and should not be used as a sole basis for treatment. Nasal washings and aspirates are unacceptable for Xpert Xpress SARS-CoV-2/FLU/RSV testing.  Fact Sheet for Patients: bloggercourse.com  Fact Sheet for Healthcare Providers: seriousbroker.it  This test is not yet approved or cleared by the [place]  FDA and has been authorized for detection and/or diagnosis of SARS-CoV-2 by FDA under an Emergency Use Authorization (EUA). This EUA will remain in effect (meaning this test can be used) for the duration of the COVID-19 declaration under Section 564(b)(1) of the Act, 21 U.S.C. section 360bbb-3(b)(1), unless the authorization is terminated or revoked.     Resp Syncytial Virus by PCR NEGATIVE NEGATIVE Final    Comment: (NOTE) Fact Sheet for Patients: bloggercourse.com  Fact Sheet for Healthcare Providers: seriousbroker.it  This test is not yet approved or cleared by  the [place]  FDA and has been authorized for detection and/or diagnosis of SARS-CoV-2 by FDA under an Emergency Use Authorization (EUA). This EUA will remain in effect (meaning this test can be used) for the duration of the COVID-19 declaration under Section 564(b)(1) of the Act, 21 U.S.C. section 360bbb-3(b)(1), unless the authorization is terminated or revoked.  Performed at [facility], [address] [place], [place] [address]   Respiratory (~20 pathogens) panel by PCR     Status: None   Collection Time: 08/18/24  8:45 AM   Specimen: Nasopharyngeal Swab; Respiratory  Result Value Ref Range Status   Adenovirus NOT DETECTED NOT DETECTED Final   Coronavirus 229E NOT DETECTED NOT DETECTED Final    Comment: (NOTE) The Coronavirus on the Respiratory Panel, DOES NOT test for the novel  Coronavirus (2019 nCoV)    Coronavirus HKU1 NOT DETECTED NOT DETECTED Final   Coronavirus NL63 NOT DETECTED NOT DETECTED Final   Coronavirus OC43 NOT DETECTED NOT DETECTED Final   Metapneumovirus NOT DETECTED NOT DETECTED Final   Rhinovirus / Enterovirus NOT DETECTED NOT DETECTED Final   Influenza A NOT DETECTED NOT DETECTED Final   Influenza B NOT DETECTED NOT DETECTED Final   Parainfluenza Virus 1 NOT DETECTED NOT DETECTED Final   Parainfluenza Virus 2 NOT DETECTED NOT DETECTED Final   Parainfluenza Virus 3 NOT DETECTED NOT DETECTED Final   Parainfluenza Virus 4 NOT DETECTED NOT DETECTED Final   Respiratory Syncytial Virus NOT DETECTED NOT DETECTED Final   Bordetella pertussis NOT DETECTED NOT DETECTED Final   Bordetella Parapertussis NOT DETECTED NOT DETECTED Final   Chlamydophila pneumoniae NOT DETECTED NOT DETECTED Final   Mycoplasma pneumoniae NOT DETECTED NOT DETECTED Final    Comment: Performed at [facility], [address], [place], [place] [address]    Labs: CBC: Recent Labs  Lab 08/17/24 2241 08/19/24 0406 08/20/24 0746 08/21/24 0556 08/22/24 0549  08/23/24 0752  WBC 9.4 11.8* 9.7 9.6 10.3 14.4*  NEUTROABS 7.4  --  8.1* 7.9*  --  12.5*  HGB 12.6 11.7* 11.2* 10.2* 10.5* 11.4*  HCT 39.3 36.5 35.3* 31.9* 32.3* 35.9*  MCV 91.0 91.5 92.7 90.4 90.2 91.6  PLT 234 240 204 191 205 249   Basic Metabolic Panel: Recent Labs  Lab 08/18/24 0951 08/19/24 0406 08/20/24 0746 08/21/24 0556 08/22/24 0549 08/23/24 0752  NA 139 139 136 136 133* 134*  K 3.9 3.1* 3.9 4.0 4.3 5.0  CL 101 101 101 103 102 102  CO2 21* 22 23 22 22  22  GLUCOSE 84 82 98 93 109* 102*  BUN 16 20 29* 39* 36* 27*  CREATININE 0.97 1.11* 1.22* 1.33* 1.05* 0.96  CALCIUM  9.3 8.9 9.2 9.4 9.6 9.5  MG 1.9  --  2.1 2.2 2.1  --   PHOS 3.5  --  3.4 3.2  --   --    Liver Function Tests: No results for input(s): AST, ALT, ALKPHOS, BILITOT, PROT, ALBUMIN in the last 168 hours. CBG: Recent Labs  Lab 08/20/24 1955  GLUCAP 125*    Discharge time spent: greater than 30 minutes.  Signed: Alban Pepper, MD [facility] 08/24/2024 "
